# Patient Record
Sex: Male | Born: 1945 | Race: Black or African American | Hispanic: No | Marital: Single | Smoking: Former smoker
Health system: Southern US, Community
[De-identification: ages and names within clinical notes are randomized; demographics above are authoritative.]

## PROBLEM LIST (undated history)

## (undated) DIAGNOSIS — M1990 Unspecified osteoarthritis, unspecified site: Secondary | ICD-10-CM

## (undated) DIAGNOSIS — R55 Syncope and collapse: Secondary | ICD-10-CM

## (undated) DIAGNOSIS — M549 Dorsalgia, unspecified: Secondary | ICD-10-CM

## (undated) DIAGNOSIS — M109 Gout, unspecified: Secondary | ICD-10-CM

## (undated) DIAGNOSIS — D132 Benign neoplasm of duodenum: Secondary | ICD-10-CM

## (undated) DIAGNOSIS — G8929 Other chronic pain: Secondary | ICD-10-CM

## (undated) DIAGNOSIS — I10 Essential (primary) hypertension: Secondary | ICD-10-CM

## (undated) HISTORY — DX: Gout, unspecified: M10.9

## (undated) HISTORY — PX: KNEE SURGERY: SHX244

## (undated) HISTORY — DX: Unspecified osteoarthritis, unspecified site: M19.90

## (undated) HISTORY — DX: Syncope and collapse: R55

## (undated) HISTORY — DX: Other chronic pain: M54.9

## (undated) HISTORY — DX: Other chronic pain: G89.29

## (undated) HISTORY — DX: Benign neoplasm of duodenum: D13.2

## (undated) HISTORY — DX: Essential (primary) hypertension: I10

## (undated) SURGERY — COLONOSCOPY WITH PROPOFOL
Anesthesia: Monitor Anesthesia Care

## (undated) NOTE — *Deleted (*Deleted)
Physical Medicine and Rehabilitation Admission H&P    Chief Complaint  Patient presents with  . Leg Pain  : HPI: Andres White is a 55 y.o. male with PMH of HTN, arthritis, tobacco use, who presented with right leg weakness, L facial weakness, and speech changes. 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  Review of Systems  Constitutional: Negative.   HENT: Negative.   Eyes: Negative.   Respiratory: Negative.   Cardiovascular: Negative.   Gastrointestinal: Negative.   Genitourinary: Negative.   Musculoskeletal: Negative.   Skin: Negative.   Neurological: Positive for speech change and focal weakness.  Endo/Heme/Allergies: Negative.   Psychiatric/Behavioral: Negative.    Past Medical History:  Diagnosis Date  . Arthritis   . Chronic back pain   . Gout   . Hypertension   . Syncope and collapse    Past Surgical History:  Procedure Laterality Date  . CARDIAC CATHETERIZATION  2015   [facility]   . KNEE SURGERY     Family History  Problem Relation Age of Onset  . Hypertension Mother    Social History:  reports that he has been smoking cigarettes. He has smoked for the past 20.00 years. He has never used smokeless tobacco. He reports current alcohol use. He reports that he does not use drugs. Allergies: No Known Allergies Medications Prior to Admission  Medication Sig Dispense Refill  . amLODipine (NORVASC) 10 MG tablet Take 10 mg by mouth daily.    [name] aspirin EC 81 MG tablet Take 1 tablet (81 mg total) by mouth daily. 90 tablet 3  . atorvastatin (LIPITOR) 20 MG tablet Take 20 mg by mouth daily.    . citalopram (CELEXA) 20 MG tablet Take 20 mg by mouth daily.    [name] ibuprofen (ADVIL,MOTRIN) 800 MG tablet Take 800 mg by mouth 3 (three) times daily as needed.    [name] lisinopril (PRINIVIL,ZESTRIL) 40 MG tablet Take  40 mg by mouth daily.    . tamsulosin (FLOMAX) 0.4 MG CAPS capsule Take 0.4 mg by mouth daily.    . colchicine 0.6 MG tablet Take 0.6 mg by mouth 2 (two) times daily as needed.      Drug Regimen Review  Drug regimen was reviewed and remains appropriate with no significant issues identified  Home: Home Living Family/patient expects to be discharged to:: Private residence Living Arrangements: Alone Available Help at Discharge: Family, Available PRN/intermittently Type of Home: House Home Access: Stairs to enter Secretary/administrator of Steps: 2 Home Layout: One level Bathroom Shower/Tub: Engineer, manufacturing systems: Standard Bathroom Accessibility: Yes Home Equipment: Cane - single point Additional Comments: Pt reported 5 falls in the last 6 months, reported that it happens when he goes to stand up but does not feel light-headed, just unsteady   Functional History: Prior Function Level of Independence: Independent  Functional Status:  Mobility: Bed Mobility Overal bed mobility: Needs Assistance Bed Mobility: Supine to Sit Supine to sit: Supervision General bed mobility comments: pt up to chair with PT when OT presents. Transfers Overall transfer level: Needs assistance Equipment used: Rolling walker (2 wheeled) Transfers: Sit to/from Stand Sit to Stand: Min assist, Min guard General transfer comment: Verbal cues for hand placement and immediate standing balance intermittently. Ambulation/Gait Ambulation/Gait assistance: Min assist Gait Distance (Feet): 60 Feet (60x2.) Assistive device: Rolling walker (2 wheeled) (use  of railing in hallway on L) Gait Pattern/deviations: Decreased dorsiflexion - right General Gait Details: Pt presents with narrow BOS. Use of Acewrap for dorsiflexion assist. PT presenting with difficulty to slow or stop gait. Gait velocity: decreased Gait velocity interpretation: <1.31 ft/sec, indicative of household ambulator    ADL: ADL Overall  ADL's : Needs assistance/impaired General ADL Comments: MIN/MOD A with seated LB dressing, increased RR/decreased O2 with attempts to fold at the waist in sitting for LB ADLs. SETUP for seated UB ADLs d/t decreased R UE coordination. Requires MIN A/CGA for ADL transfers/fxl mobility with RW as he needs B UE support.  Cognition: Cognition Overall Cognitive Status: Impaired/Different from baseline Arousal/Alertness: Awake/alert Orientation Level: Oriented to person, Oriented to place, Oriented to situation, Disoriented to time Attention: Focused Focused Attention: Appears intact Memory: Impaired Memory Impairment: Decreased recall of new information, Decreased short term memory Immediate Memory Recall: Sock, Cyndee Brightly, Bed Memory Recall Sock: With Cue Memory Recall Blue: With Cue Memory Recall Bed: With Cue Awareness: Appears intact Executive Function: Sequencing Sequencing: Impaired Cognition Arousal/Alertness: Awake/alert Behavior During Therapy: WFL for tasks assessed/performed Overall Cognitive Status: Impaired/Different from baseline Area of Impairment: Orientation, Safety/judgement, Problem solving, Awareness Orientation Level: Disoriented to, Time Memory: Decreased short-term memory, Decreased recall of precautions Following Commands: Follows one step commands consistently, Follows multi-step commands inconsistently Safety/Judgement: Decreased awareness of safety, Decreased awareness of deficits Problem Solving: Slow processing, Requires verbal cues, Requires tactile cues  Physical Exam: Blood pressure (!) 106/57, pulse 69, temperature 97.9 F (36.6 C), temperature source Oral, resp. rate 18, height 5\' 7"  (1.702 m), weight 74.4 kg, SpO2 97 %. Physical Exam General: Alert and oriented x 3, No apparent distress HEENT: Head is normocephalic, atraumatic, PERRLA, EOMI, sclera anicteric, oral mucosa pink and moist, dentition intact, ext ear canals clear,  Neck: Supple without JVD or  lymphadenopathy Heart: Reg rate and rhythm. No murmurs rubs or gallops Chest: CTA bilaterally without wheezes, rales, or rhonchi; no distress Abdomen: Soft, non-tender, non-distended, bowel sounds positive. Extremities: No clubbing, cyanosis, or edema. Pulses are 2+ Skin: Clean and intact without signs of breakdown Neuro: Alert and oriented x1 (person). Mild expressive aphasia and dysarthria. Requires cues for immediate recall 3/3. Cranial nerves 2-12 are intact except for R facial droop. Sensory exam is normal. Reflexes are 2+ in all 4's. Fine motor coordination is intact. No tremors. Motor function is grossly 5/5 except for 4/5 RLE.   Psych: Pt's affect is appropriate. Pt is cooperative No results found for this or any previous visit (from the past 48 hour(s)). No results found.  Medical Problem List and Plan: 1.  Impaired mobility and ADLs secondary to acute CVA with left midbrain infarct  -patient may shower  -ELOS/Goals: 10-14 days S 2.  Antithrombotics: -DVT/anticoagulation:  Pharmaceutical: Lovenox  -antiplatelet therapy: Aspirin 3. Pain Management: *** 4. Mood: Continue Celexa 20mg  daily for depression 5. Neuropsych: This patient is capable of making decisions on his own behalf. 6. Skin/Wound Care: *** 7. Fluids/Electrolytes/Nutrition: *** 8. HTN: Monitor BP tid--continue amlodipine 10mg  and Lisinopril 40mg  daily 9. Tobacco abuse: 10.  Jacquelynn Cree, PA-C 06/27/2020   I have personally performed a face to face diagnostic evaluation, including, but not limited to relevant history and physical exam findings, of this patient and developed relevant assessment and plan.  Additionally, I have reviewed and concur with the physician assistant's documentation above.  Sula Soda, MD

---

## 2013-08-04 HISTORY — PX: CARDIAC CATHETERIZATION: SHX172

## 2014-01-06 ENCOUNTER — Emergency Department: Payer: Self-pay | Admitting: Emergency Medicine

## 2014-01-06 LAB — CBC
HCT: 40.1 % (ref 40.0–52.0)
HGB: 12.9 g/dL — ABNORMAL LOW (ref 13.0–18.0)
MCH: 30.4 pg (ref 26.0–34.0)
MCHC: 32.3 g/dL (ref 32.0–36.0)
MCV: 94 fL (ref 80–100)
Platelet: 140 10*3/uL — ABNORMAL LOW (ref 150–440)
RBC: 4.26 10*6/uL — AB (ref 4.40–5.90)
RDW: 13.3 % (ref 11.5–14.5)
WBC: 8.4 10*3/uL (ref 3.8–10.6)

## 2014-01-06 LAB — COMPREHENSIVE METABOLIC PANEL
ALBUMIN: 3.5 g/dL (ref 3.4–5.0)
ANION GAP: 7 (ref 7–16)
Alkaline Phosphatase: 76 U/L
BUN: 22 mg/dL — ABNORMAL HIGH (ref 7–18)
Bilirubin,Total: 0.4 mg/dL (ref 0.2–1.0)
CALCIUM: 9.1 mg/dL (ref 8.5–10.1)
CHLORIDE: 111 mmol/L — AB (ref 98–107)
CREATININE: 1.43 mg/dL — AB (ref 0.60–1.30)
Co2: 18 mmol/L — ABNORMAL LOW (ref 21–32)
EGFR (African American): 57 — ABNORMAL LOW
GFR CALC NON AF AMER: 49 — AB
Glucose: 93 mg/dL (ref 65–99)
OSMOLALITY: 275 (ref 275–301)
Potassium: 4.4 mmol/L (ref 3.5–5.1)
SGOT(AST): 51 U/L — ABNORMAL HIGH (ref 15–37)
SGPT (ALT): 43 U/L (ref 12–78)
Sodium: 136 mmol/L (ref 136–145)
Total Protein: 8.2 g/dL (ref 6.4–8.2)

## 2014-01-06 LAB — URINALYSIS, COMPLETE
BACTERIA: NONE SEEN
BILIRUBIN, UR: NEGATIVE
BLOOD: NEGATIVE
Glucose,UR: NEGATIVE mg/dL (ref 0–75)
Leukocyte Esterase: NEGATIVE
Nitrite: NEGATIVE
PH: 5 (ref 4.5–8.0)
Protein: NEGATIVE
RBC,UR: 1 /HPF (ref 0–5)
SPECIFIC GRAVITY: 1.023 (ref 1.003–1.030)
WBC UR: <1 /HPF (ref 0–5)

## 2014-01-06 LAB — LIPASE, BLOOD: LIPASE: 124 U/L (ref 73–393)

## 2014-03-23 ENCOUNTER — Emergency Department: Payer: Self-pay | Admitting: Emergency Medicine

## 2014-03-23 LAB — CBC WITH DIFFERENTIAL/PLATELET
BASOS ABS: 0 10*3/uL (ref 0.0–0.1)
BASOS PCT: 0.6 %
Eosinophil #: 0.1 10*3/uL (ref 0.0–0.7)
Eosinophil %: 1.3 %
HCT: 39.7 % — ABNORMAL LOW (ref 40.0–52.0)
HGB: 12.6 g/dL — AB (ref 13.0–18.0)
LYMPHS PCT: 40.6 %
Lymphocyte #: 2.9 10*3/uL (ref 1.0–3.6)
MCH: 30.2 pg (ref 26.0–34.0)
MCHC: 31.8 g/dL — ABNORMAL LOW (ref 32.0–36.0)
MCV: 95 fL (ref 80–100)
Monocyte #: 0.5 x10 3/mm (ref 0.2–1.0)
Monocyte %: 7.7 %
NEUTROS ABS: 3.6 10*3/uL (ref 1.4–6.5)
Neutrophil %: 49.8 %
Platelet: 176 10*3/uL (ref 150–440)
RBC: 4.17 10*6/uL — AB (ref 4.40–5.90)
RDW: 14.1 % (ref 11.5–14.5)
WBC: 7.1 10*3/uL (ref 3.8–10.6)

## 2014-03-23 LAB — URINALYSIS, COMPLETE
Bilirubin,UR: NEGATIVE
Blood: NEGATIVE
Glucose,UR: NEGATIVE mg/dL (ref 0–75)
Hyaline Cast: 23
KETONE: NEGATIVE
Leukocyte Esterase: NEGATIVE
Nitrite: NEGATIVE
Ph: 5 (ref 4.5–8.0)
Protein: NEGATIVE
RBC,UR: NONE SEEN /HPF (ref 0–5)
Specific Gravity: 1.01 (ref 1.003–1.030)
Squamous Epithelial: 2
WBC UR: 2 /HPF (ref 0–5)

## 2014-03-23 LAB — BASIC METABOLIC PANEL
Anion Gap: 11 (ref 7–16)
BUN: 36 mg/dL — AB (ref 7–18)
CHLORIDE: 106 mmol/L (ref 98–107)
CO2: 17 mmol/L — AB (ref 21–32)
Calcium, Total: 8.9 mg/dL (ref 8.5–10.1)
Creatinine: 1.88 mg/dL — ABNORMAL HIGH (ref 0.60–1.30)
EGFR (African American): 41 — ABNORMAL LOW
EGFR (Non-African Amer.): 35 — ABNORMAL LOW
Glucose: 116 mg/dL — ABNORMAL HIGH (ref 65–99)
OSMOLALITY: 278 (ref 275–301)
Potassium: 3.5 mmol/L (ref 3.5–5.1)
SODIUM: 134 mmol/L — AB (ref 136–145)

## 2014-03-23 LAB — TROPONIN I: Troponin-I: <0.02 ng/mL

## 2014-03-24 ENCOUNTER — Ambulatory Visit (INDEPENDENT_AMBULATORY_CARE_PROVIDER_SITE_OTHER): Payer: Medicare Other | Admitting: Cardiovascular Disease

## 2014-03-24 ENCOUNTER — Encounter: Payer: Self-pay | Admitting: Cardiovascular Disease

## 2014-03-24 VITALS — BP 100/70 | HR 61 | Ht 67.0 in | Wt 176.5 lb

## 2014-03-24 DIAGNOSIS — R55 Syncope and collapse: Secondary | ICD-10-CM | POA: Insufficient documentation

## 2014-03-24 DIAGNOSIS — R0789 Other chest pain: Secondary | ICD-10-CM | POA: Insufficient documentation

## 2014-03-24 DIAGNOSIS — I10 Essential (primary) hypertension: Secondary | ICD-10-CM | POA: Insufficient documentation

## 2014-03-24 DIAGNOSIS — R079 Chest pain, unspecified: Secondary | ICD-10-CM

## 2014-03-24 NOTE — Progress Notes (Signed)
HPI  This is a 68 year old African American male who was referred from the emergency room at [facility] for evaluation of chest pain and syncope. He is not aware of any previous cardiac history. He has known history of chronic back pain, hypertension, severe gout, tobacco use and possible chronic kidney disease. He has no family history of coronary artery disease or sudden death. Yesterday, while he was playing cards he had some chest pain followed by sweating and brief loss of consciousness. He reports that he ate peg feet earlier in the day and did not feel well. He was taken to the emergency room at [facility]. ECG showed sinus rhythm with possible old anterior and inferior infarcts labs showed a creatinine of 1.88 and BUN of 36. Troponin was normal. He was discharged home and asked to followup. He reports no previous similar episodes.  No Known Allergies   No current outpatient prescriptions on file prior to visit.   No current facility-administered medications on file prior to visit.     Past Medical History  Diagnosis Date  . Syncope and collapse   . Arthritis   . Gout   . Hypertension   . Chronic back pain      Past Surgical History  Procedure Laterality Date  . Knee surgery    . Cardiac catheterization  2015    [facility]      Family History  Problem Relation Age of Onset  . Hypertension Mother      History   Social History  . Marital Status: Single    Spouse Name: N/A    Number of Children: N/A  . Years of Education: N/A   Occupational History  . Not on file.   Social History Main Topics  . Smoking status: Current Every Day Smoker -- 20 years    Types: Cigarettes  . Smokeless tobacco: Not on file  . Alcohol Use: Yes     Comment: occasional  . Drug Use: No  . Sexual Activity: Not on file   Other Topics Concern  . Not on file   Social History Narrative  . No narrative on file     ROS A 10 point review of system was performed. It is negative other than that  mentioned in the history of present illness.   PHYSICAL EXAM   BP 100/70  Pulse 61  Ht 5\' 7"  (1.702 m)  Wt 176 lb 8 oz (80.06 kg)  BMI 27.64 kg/m2 Constitutional: He is oriented to person, place, and time. He appears well-developed and well-nourished. No distress.  HENT: No nasal discharge.  Head: Normocephalic and atraumatic.  Eyes: Pupils are equal and round.  No discharge. Neck: Normal range of motion. Neck supple. No JVD present. No thyromegaly present.  Cardiovascular: Normal rate, regular rhythm, normal heart sounds. Exam reveals no gallop and no friction rub. No murmur heard.  Pulmonary/Chest: Effort normal and breath sounds normal. No stridor. No respiratory distress. He has no wheezes. He has no rales. He exhibits no tenderness.  Abdominal: Soft. Bowel sounds are normal. He exhibits no distension. There is no tenderness. There is no rebound and no guarding.  Musculoskeletal: Normal range of motion. He exhibits no edema and no tenderness.  Neurological: He is alert and oriented to person, place, and time. Coordination normal.  Skin: Skin is warm and dry. No rash noted. He is not diaphoretic. No erythema. No pallor.  Psychiatric: He has a normal mood and affect. His behavior is normal. Judgment and thought  content normal.       TZG:YFVCB  Rhythm  -Anteroseptal infarct -age undetermined  -Old inferior infarct.   ABNORMAL    ASSESSMENT AND PLAN

## 2014-03-24 NOTE — Patient Instructions (Addendum)
Andres White  Your caregiver has ordered a Stress Test with nuclear imaging. The purpose of this test is to evaluate the blood supply to your heart muscle. This procedure is referred to as a "Non-Invasive Stress Test." This is because other than having an IV started in your vein, nothing is inserted or "invades" your body. Cardiac stress tests are done to find areas of poor blood flow to the heart by determining the extent of coronary artery disease (CAD). Some patients exercise on a treadmill, which naturally increases the blood flow to your heart, while others who are  unable to walk on a treadmill due to physical limitations have a pharmacologic/chemical stress agent called Lexiscan . This medicine will mimic walking on a treadmill by temporarily increasing your coronary blood flow.   Please note: these test may take anywhere between 2-4 hours to complete  PLEASE REPORT TO [facility] AT THE FIRST DESK WILL DIRECT YOU WHERE TO GO  Date of Procedure:________8/28/15_____________________________  Arrival Time for Procedure:______0945 am ________________________   PLEASE NOTIFY THE OFFICE AT LEAST 24 HOURS IN ADVANCE IF YOU ARE UNABLE TO KEEP YOUR APPOINTMENT.  [phone] AND  PLEASE NOTIFY NUCLEAR MEDICINE AT [facility] AT LEAST 24 HOURS IN ADVANCE IF YOU ARE UNABLE TO KEEP YOUR APPOINTMENT. [phone]  How to prepare for your Myoview test:  1. Do not eat or drink after midnight 2. No caffeine for 24 hours prior to test 3. No smoking 24 hours prior to test. 4. Your medication may be taken with water.  If your doctor stopped a medication because of this test, do not take that medication. 5. Ladies, please do not wear dresses.  Skirts or pants are appropriate. Please wear a short sleeve shirt. 6. No perfume, cologne or lotion. 7. Wear comfortable walking shoes. No heels!  Your physician has requested that you have an echocardiogram. Echocardiography is a  painless test that uses sound waves to create images of your heart. It provides your doctor with information about the size and shape of your heart and how well your heart's chambers and valves are working. This procedure takes approximately one hour. There are no restrictions for this procedure.   Your physician has requested that you have a carotid duplex. This test is an ultrasound of the carotid arteries in your neck. It looks at blood flow through these arteries that supply the brain with blood. Allow one hour for this exam. There are no restrictions or special instructions.   Your physician recommends that you schedule a follow-up appointment in:  After your tests with Dr. Fletcher Anon

## 2014-03-24 NOTE — Assessment & Plan Note (Addendum)
Likely was vasovagal syncope with an element of volume depletion as his creatinine and BUN were above baseline.  I encouraged him to increase fluid intake. Given his abnormal EKG, I ordered an echocardiogram. He also has prolonged tobacco use and thus I ordered carotid Doppler.

## 2014-03-24 NOTE — Assessment & Plan Note (Signed)
Blood pressure is well controlled on current medications. 

## 2014-03-24 NOTE — Assessment & Plan Note (Signed)
Symptoms are overall atypical. However, the patient has significant abnormal EKG with possible old anterior and inferior infarcts. I recommend evaluation with a pharmacologic nuclear stress test.

## 2014-03-31 ENCOUNTER — Ambulatory Visit: Payer: Self-pay | Admitting: Cardiovascular Disease

## 2014-03-31 DIAGNOSIS — R079 Chest pain, unspecified: Secondary | ICD-10-CM

## 2014-04-03 ENCOUNTER — Other Ambulatory Visit: Payer: Self-pay

## 2014-04-03 DIAGNOSIS — R079 Chest pain, unspecified: Secondary | ICD-10-CM

## 2014-04-07 ENCOUNTER — Other Ambulatory Visit: Payer: Self-pay

## 2014-04-07 ENCOUNTER — Encounter (INDEPENDENT_AMBULATORY_CARE_PROVIDER_SITE_OTHER): Payer: Medicare Other

## 2014-04-07 ENCOUNTER — Other Ambulatory Visit (INDEPENDENT_AMBULATORY_CARE_PROVIDER_SITE_OTHER): Payer: Medicare Other

## 2014-04-07 DIAGNOSIS — I6529 Occlusion and stenosis of unspecified carotid artery: Secondary | ICD-10-CM

## 2014-04-07 DIAGNOSIS — R55 Syncope and collapse: Secondary | ICD-10-CM

## 2014-04-07 DIAGNOSIS — R079 Chest pain, unspecified: Secondary | ICD-10-CM

## 2014-04-11 NOTE — Progress Notes (Signed)
No answer/no vm 9/8

## 2014-04-13 ENCOUNTER — Ambulatory Visit (INDEPENDENT_AMBULATORY_CARE_PROVIDER_SITE_OTHER): Payer: Medicare Other | Admitting: Cardiovascular Disease

## 2014-04-13 ENCOUNTER — Encounter: Payer: Self-pay | Admitting: Cardiovascular Disease

## 2014-04-13 VITALS — BP 135/82 | HR 55 | Ht 67.0 in | Wt 179.4 lb

## 2014-04-13 DIAGNOSIS — I10 Essential (primary) hypertension: Secondary | ICD-10-CM

## 2014-04-13 DIAGNOSIS — I6529 Occlusion and stenosis of unspecified carotid artery: Secondary | ICD-10-CM

## 2014-04-13 DIAGNOSIS — I6522 Occlusion and stenosis of left carotid artery: Secondary | ICD-10-CM | POA: Insufficient documentation

## 2014-04-13 DIAGNOSIS — R0789 Other chest pain: Secondary | ICD-10-CM

## 2014-04-13 MED ORDER — ASPIRIN EC 81 MG PO TBEC: 81.0000 mg | DELAYED_RELEASE_TABLET | Freq: Every day | ORAL | Status: DC

## 2014-04-13 NOTE — Assessment & Plan Note (Signed)
Blood pressure is well controlled on current medications. 

## 2014-04-13 NOTE — Assessment & Plan Note (Signed)
There was 40-59% left carotid stenosis. I advised him to continue on aspirin 81 mg once daily. He gets his lipid profile checked with his primary care physician. I recommend a target LDL of less than 100. I scheduled him for a followup carotid Doppler in one year.

## 2014-04-13 NOTE — Progress Notes (Signed)
HPI  This is a 68 year old African American male who is here today for a followup visit regarding chest pain and syncope. He is not aware of any previous cardiac history. He has known history of chronic back pain, hypertension, severe gout, tobacco use and possible chronic kidney disease. He has no family history of coronary artery disease or sudden death. He was seen recently for an episode of chest pain followed by sweating and brief loss of consciousness while he was playing cards. He reported that he ate peg feet earlier in the day and did not feel well. He was taken to the emergency room at [facility]. ECG showed sinus rhythm with possible old anterior and inferior infarcts labs showed a creatinine of 1.88 and BUN of 36. Troponin was normal. He was discharged home and asked to followup. He reports no previous similar episodes. He underwent cardiac workup which included a nuclear stress test which showed no evidence of ischemia. Echocardiogram showed normal LV systolic function with grade 1 diastolic dysfunction. Carotid Doppler showed moderate left carotid stenosis. He is doing well and reports no new symptoms.  No Known Allergies   Current Outpatient Prescriptions on File Prior to Visit  Medication Sig Dispense Refill  . amLODipine (NORVASC) 10 MG tablet Take 10 mg by mouth daily.      . citalopram (CELEXA) 20 MG tablet Take 20 mg by mouth daily.      . colchicine 0.6 MG tablet Take 0.6 mg by mouth 2 (two) times daily as needed.      [name] ibuprofen (ADVIL,MOTRIN) 800 MG tablet Take 800 mg by mouth 3 (three) times daily as needed.      [name] lisinopril (PRINIVIL,ZESTRIL) 40 MG tablet Take 40 mg by mouth daily.      [name] oxyCODONE-acetaminophen (PERCOCET/ROXICET) 5-325 MG per tablet Take 1 tablet by mouth every 8 (eight) hours as needed for severe pain.       No current facility-administered medications on file prior to visit.     Past Medical History  Diagnosis Date  . Syncope and collapse   .  Arthritis   . Gout   . Hypertension   . Chronic back pain      Past Surgical History  Procedure Laterality Date  . Knee surgery    . Cardiac catheterization  2015    [facility]      Family History  Problem Relation Age of Onset  . Hypertension Mother      History   Social History  . Marital Status: Single    Spouse Name: N/A    Number of Children: N/A  . Years of Education: N/A   Occupational History  . Not on file.   Social History Main Topics  . Smoking status: Current Every Day Smoker -- 20 years    Types: Cigarettes  . Smokeless tobacco: Not on file  . Alcohol Use: Yes     Comment: occasional  . Drug Use: No  . Sexual Activity: Not on file   Other Topics Concern  . Not on file   Social History Narrative  . No narrative on file     ROS A 10 point review of system was performed. It is negative other than that mentioned in the history of present illness.   PHYSICAL EXAM   BP 135/82  Pulse 55  Ht 5\' 7"  (1.702 m)  Wt 179 lb 6.4 oz (81.375 kg)  BMI 28.09 kg/m2 Constitutional: He is oriented to person, place, and time.  He appears well-developed and well-nourished. No distress.  HENT: No nasal discharge.  Head: Normocephalic and atraumatic.  Eyes: Pupils are equal and round.  No discharge. Neck: Normal range of motion. Neck supple. No JVD present. No thyromegaly present.  Cardiovascular: Normal rate, regular rhythm, normal heart sounds. Exam reveals no gallop and no friction rub. No murmur heard.  Pulmonary/Chest: Effort normal and breath sounds normal. No stridor. No respiratory distress. He has no wheezes. He has no rales. He exhibits no tenderness.  Abdominal: Soft. Bowel sounds are normal. He exhibits no distension. There is no tenderness. There is no rebound and no guarding.  Musculoskeletal: Normal range of motion. He exhibits no edema and no tenderness.  Neurological: He is alert and oriented to person, place, and time. Coordination normal.  Skin:  Skin is warm and dry. No rash noted. He is not diaphoretic. No erythema. No pallor.  Psychiatric: He has a normal mood and affect. His behavior is normal. Judgment and thought content normal.       ASSESSMENT AND PLAN

## 2014-04-13 NOTE — Assessment & Plan Note (Signed)
This result completely. Nuclear stress test was normal. No further cardiac workup is recommended.

## 2014-04-13 NOTE — Patient Instructions (Signed)
Continue same medications.   Your physician wants you to follow-up in: 1 year.  You will receive a reminder letter in the mail two months in advance. If you don't receive a letter, please call our office to schedule the follow-up appointment.  

## 2014-04-28 ENCOUNTER — Telehealth: Payer: Self-pay | Admitting: *Deleted

## 2014-04-28 DIAGNOSIS — I6522 Occlusion and stenosis of left carotid artery: Secondary | ICD-10-CM

## 2014-04-28 NOTE — Telephone Encounter (Signed)
Message copied by Tracie Harrier on Fri Apr 28, 2014  4:55 PM ------      Message from: Kathlyn Sacramento A      Created: Mon Apr 10, 2014 11:32 AM       Moderate left carotid stenosis. Recommend a follow up carotid doppler in 1 year. ------

## 2015-06-19 ENCOUNTER — Other Ambulatory Visit: Payer: Self-pay | Admitting: Cardiovascular Disease

## 2015-06-19 DIAGNOSIS — I6523 Occlusion and stenosis of bilateral carotid arteries: Secondary | ICD-10-CM

## 2015-06-26 ENCOUNTER — Encounter: Payer: Self-pay | Admitting: *Deleted

## 2015-06-26 ENCOUNTER — Ambulatory Visit: Payer: Self-pay | Admitting: Cardiovascular Disease

## 2015-06-26 DIAGNOSIS — R0989 Other specified symptoms and signs involving the circulatory and respiratory systems: Secondary | ICD-10-CM

## 2015-07-31 ENCOUNTER — Encounter: Payer: Self-pay | Admitting: *Deleted

## 2015-07-31 ENCOUNTER — Other Ambulatory Visit: Payer: Self-pay | Admitting: Cardiovascular Disease

## 2015-07-31 DIAGNOSIS — I6522 Occlusion and stenosis of left carotid artery: Secondary | ICD-10-CM

## 2015-08-15 ENCOUNTER — Encounter (HOSPITAL_COMMUNITY): Payer: Self-pay | Admitting: Dermatopathology

## 2015-09-04 ENCOUNTER — Encounter: Payer: Self-pay | Admitting: *Deleted

## 2015-09-04 ENCOUNTER — Ambulatory Visit: Payer: Self-pay | Admitting: Cardiovascular Disease

## 2017-11-16 ENCOUNTER — Encounter: Payer: Self-pay | Admitting: Emergency Medicine

## 2017-11-16 ENCOUNTER — Emergency Department: Payer: Medicare Other

## 2017-11-16 DIAGNOSIS — I10 Essential (primary) hypertension: Secondary | ICD-10-CM | POA: Insufficient documentation

## 2017-11-16 DIAGNOSIS — R0602 Shortness of breath: Secondary | ICD-10-CM | POA: Diagnosis not present

## 2017-11-16 DIAGNOSIS — R079 Chest pain, unspecified: Secondary | ICD-10-CM | POA: Diagnosis present

## 2017-11-16 DIAGNOSIS — F17210 Nicotine dependence, cigarettes, uncomplicated: Secondary | ICD-10-CM | POA: Insufficient documentation

## 2017-11-16 DIAGNOSIS — Z79899 Other long term (current) drug therapy: Secondary | ICD-10-CM | POA: Insufficient documentation

## 2017-11-16 LAB — BASIC METABOLIC PANEL
ANION GAP: 9 (ref 5–15)
BUN: 14 mg/dL (ref 6–20)
CHLORIDE: 106 mmol/L (ref 101–111)
CO2: 21 mmol/L — AB (ref 22–32)
Calcium: 8.9 mg/dL (ref 8.9–10.3)
Creatinine, Ser: 1.42 mg/dL — ABNORMAL HIGH (ref 0.61–1.24)
GFR calc non Af Amer: 47 mL/min — ABNORMAL LOW (ref >60)
GFR, EST AFRICAN AMERICAN: 55 mL/min — AB (ref >60)
GLUCOSE: 105 mg/dL — AB (ref 65–99)
Potassium: 3.5 mmol/L (ref 3.5–5.1)
Sodium: 136 mmol/L (ref 135–145)

## 2017-11-16 LAB — CBC
HEMATOCRIT: 41.1 % (ref 40.0–52.0)
HEMOGLOBIN: 13.5 g/dL (ref 13.0–18.0)
MCH: 30.8 pg (ref 26.0–34.0)
MCHC: 32.9 g/dL (ref 32.0–36.0)
MCV: 93.6 fL (ref 80.0–100.0)
Platelets: 143 10*3/uL — ABNORMAL LOW (ref 150–440)
RBC: 4.39 MIL/uL — AB (ref 4.40–5.90)
RDW: 15.2 % — ABNORMAL HIGH (ref 11.5–14.5)
WBC: 6.9 10*3/uL (ref 3.8–10.6)

## 2017-11-16 LAB — TROPONIN I: Troponin I: <0.03 ng/mL (ref <0.03)

## 2017-11-16 NOTE — ED Triage Notes (Signed)
Pt to triage in wheelchair with left sided chest pain. Pts family reports pt came home, did not get out of car and family seen pt slumped over passed out. Pt woke to family's voice and c/o of "feeling bad" and chest pain with SOB. Pt in triage is laughing and sts, "Aint nothing wrong with me." Pt denies N/V and denies cardiac hx.

## 2017-11-17 ENCOUNTER — Emergency Department
Admission: EM | Admit: 2017-11-17 | Discharge: 2017-11-17 | Disposition: A | Discharge status: Home / Self Care | Payer: Medicare Other | Attending: Emergency Medicine | Admitting: Emergency Medicine

## 2017-11-17 DIAGNOSIS — R079 Chest pain, unspecified: Secondary | ICD-10-CM

## 2017-11-17 LAB — TROPONIN I

## 2017-11-17 LAB — BRAIN NATRIURETIC PEPTIDE: B NATRIURETIC PEPTIDE 5: 15 pg/mL (ref 0.0–100.0)

## 2017-11-17 NOTE — ED Provider Notes (Signed)
[facility] Emergency Department Provider Note   ____________________________________________   First MD Initiated Contact with Patient 11/17/17 [phone]     (approximate)  I have reviewed the triage vital signs and the nursing notes.   HISTORY  Chief Complaint Chest Pain    HPI Andres White is a 72 y.o. male who comes into the hospital today with some chest pain.  His girlfriend called the ambulance because he seemed very out of it and she thought he was going to pass out.  He was slobbering and he complained of some headache and chest pain.  She was concerned about a heart attack.  He was seen by the ambulance earlier today but then brought in later this evening.  The patient states that his chest pain started today and it was in the mid chest with no radiation.  He endorses some sweats with no dizziness and no nausea or vomiting.  He did not take anything for the chest pain he states that it is gone at this time.  The patient states that he gets short of breath often but he is a smoker and he does drink 3-4 beers daily.  The patient is here for evaluation.   Past Medical History:  Diagnosis Date  . Arthritis   . Chronic back pain   . Gout   . Hypertension   . Syncope and collapse     Patient Active Problem List   Diagnosis Date Noted  . Left carotid stenosis 04/13/2014  . Faintness 03/24/2014  . Chest discomfort 03/24/2014  . Hypertension     Past Surgical History:  Procedure Laterality Date  . CARDIAC CATHETERIZATION  2015   [facility]   . KNEE SURGERY      Prior to Admission medications   Medication Sig Start Date End Date Taking? Authorizing Provider  amLODipine (NORVASC) 10 MG tablet Take 10 mg by mouth daily.    [provider]  aspirin EC 81 MG tablet Take 1 tablet (81 mg total) by mouth daily. 04/13/14   [name], MD  citalopram (CELEXA) 20 MG tablet Take 20 mg by mouth daily.    [provider]  colchicine 0.6 MG  tablet Take 0.6 mg by mouth 2 (two) times daily as needed.    [provider]  ibuprofen (ADVIL,MOTRIN) 800 MG tablet Take 800 mg by mouth 3 (three) times daily as needed.    [provider]  lisinopril (PRINIVIL,ZESTRIL) 40 MG tablet Take 40 mg by mouth daily.    [provider]  oxyCODONE-acetaminophen (PERCOCET/ROXICET) 5-325 MG per tablet Take 1 tablet by mouth every 8 (eight) hours as needed for severe pain.    [provider]    Allergies Patient has no known allergies.  Family History  Problem Relation Age of Onset  . Hypertension Mother     Social History Social History   Tobacco Use  . Smoking status: Current Every Day Smoker    Years: 20.00    Types: Cigarettes  . Smokeless tobacco: Never Used  Substance Use Topics  . Alcohol use: Yes    Comment: occasional  . Drug use: No    Review of Systems  Constitutional: Sweats Eyes: No visual changes. ENT: No sore throat. Cardiovascular:  chest pain. Respiratory:  shortness of breath. Gastrointestinal: No abdominal pain.  No nausea, no vomiting.  No diarrhea.  No constipation. Genitourinary: Negative for dysuria. Musculoskeletal: Negative for back pain. Skin: Negative for rash. Neurological: Negative for  headaches, focal weakness or numbness.   ____________________________________________   PHYSICAL EXAM:  VITAL SIGNS: ED Triage Vitals [11/16/17 2017]  Enc Vitals Group     BP (!) 98/57     Pulse Rate (!) 48     Resp 20     Temp      Temp Source Oral     SpO2 97 %     Weight 179 lb (81.2 kg)     Height      Head Circumference      Peak Flow      Pain Score 5     Pain Loc      Pain Edu?      Excl. in [place]?     Constitutional: Alert and oriented. Well appearing and in mild distress. Eyes: Conjunctivae are normal. PERRL. EOMI. Head: Atraumatic. Nose: No congestion/rhinnorhea. Mouth/Throat: Mucous membranes are moist.  Oropharynx non-erythematous. Cardiovascular:  Normal rate, regular rhythm. Grossly normal heart sounds.  Good peripheral circulation. Respiratory: Normal respiratory effort.  No retractions. Lungs CTAB. Gastrointestinal: Soft and nontender. No distention.  Positive bowel sounds Musculoskeletal: No lower extremity tenderness nor edema.   Neurologic:  Normal speech and language.  Skin:  Skin is warm, dry and intact.  Psychiatric: Mood and affect are normal.   ____________________________________________   LABS (all labs ordered are listed, but only abnormal results are displayed)  Labs Reviewed  BASIC METABOLIC PANEL - Abnormal; Notable for the following components:      Result Value   CO2 21 (*)    Glucose, Bld 105 (*)    Creatinine, Ser 1.42 (*)    GFR calc non Af Amer 47 (*)    GFR calc Af Amer 55 (*)    All other components within normal limits  CBC - Abnormal; Notable for the following components:   RBC 4.39 (*)    RDW 15.2 (*)    Platelets 143 (*)    All other components within normal limits  TROPONIN I  BRAIN NATRIURETIC PEPTIDE  TROPONIN I   ____________________________________________  EKG  ED ECG REPORT I, Loney Hering, the attending physician, personally viewed and interpreted this ECG.   Date: 11/16/2017  EKG Time: 2019  Rate: 46  Rhythm: sinus tachycardia  Axis: left axis deviation  Intervals:none  ST&T Change: none  ____________________________________________  RADIOLOGY  ED MD interpretation: Chest x-ray: No active cardiopulmonary process, diffuse mild bronchitic changes are likely chronic, linear atelectasis  Official radiology report(s): Dg Chest 2 View  Result Date: 11/16/2017 CLINICAL DATA:  72 year old male with left-sided chest pain EXAM: CHEST - 2 VIEW COMPARISON:  None. FINDINGS: Low inspiratory volumes. Linear atelectasis versus scarring in the lingula. Mild vascular congestion without overt edema. Diffuse mild bronchitic changes are likely chronic. No pneumothorax, pleural  effusion or focal airspace consolidation. No acute osseous abnormality. IMPRESSION: 1. No active cardiopulmonary process. 2. Diffuse mild bronchitic changes are likely chronic. 3. Linear atelectasis versus scarring in the lingula. 4. Slightly low inspiratory volumes. Electronically Signed   By: Jacqulynn Cadet M.D.   On: 11/16/2017 20:51    ____________________________________________   PROCEDURES  Procedure(s) performed: None  Procedures  Critical Care performed: No  ____________________________________________   INITIAL IMPRESSION / ASSESSMENT AND PLAN / ED COURSE  As part of my medical decision making, I reviewed the following data within the electronic MEDICAL RECORD NUMBER Notes from prior ED visits and [place] Controlled Substance Database   This is a 72 year old male who comes into the hospital  today with some chest pain and shortness of breath.  The patient states that his pain is gone at this time.  My differential diagnosis includes COPD, pneumonia, acute coronary syndrome, nonspecific chest pain, gastritis.  We did check some blood work on the patient to include a CBC BMP and 2 troponins.  The patient's blood work is negative.  The patient also had a BNP as he is been having some dyspnea on exertion but his BNP is negative.  The patient again feels that his pain is improved and he feels comfortable going home and following up with his primary care physician.  The patient did have some bradycardia while he was asleep but when he woke up his heart rate improved.  The patient will be discharged home to follow-up with his primary care physician.      ____________________________________________   FINAL CLINICAL IMPRESSION(S) / ED DIAGNOSES  Final diagnoses:  Chest pain, unspecified type     ED Discharge Orders    None       Note:  This document was prepared using Dragon voice recognition software and may include unintentional dictation errors.    Loney Hering,  MD 11/17/17 [phone]

## 2017-11-17 NOTE — Discharge Instructions (Addendum)
Please follow-up with your primary care physician for further evaluation of your chest pain. °

## 2017-11-18 ENCOUNTER — Emergency Department
Admission: EM | Admit: 2017-11-18 | Discharge: 2017-11-18 | Disposition: A | Discharge status: Home / Self Care | Payer: Medicare Other | Attending: Emergency Medicine | Admitting: Emergency Medicine

## 2017-11-18 ENCOUNTER — Other Ambulatory Visit: Payer: Self-pay

## 2017-11-18 DIAGNOSIS — E860 Dehydration: Secondary | ICD-10-CM | POA: Diagnosis not present

## 2017-11-18 DIAGNOSIS — R55 Syncope and collapse: Secondary | ICD-10-CM | POA: Diagnosis present

## 2017-11-18 DIAGNOSIS — Z79899 Other long term (current) drug therapy: Secondary | ICD-10-CM | POA: Insufficient documentation

## 2017-11-18 DIAGNOSIS — F17210 Nicotine dependence, cigarettes, uncomplicated: Secondary | ICD-10-CM | POA: Diagnosis not present

## 2017-11-18 DIAGNOSIS — I10 Essential (primary) hypertension: Secondary | ICD-10-CM | POA: Diagnosis not present

## 2017-11-18 DIAGNOSIS — Z7982 Long term (current) use of aspirin: Secondary | ICD-10-CM | POA: Diagnosis not present

## 2017-11-18 LAB — URINALYSIS, COMPLETE (UACMP) WITH MICROSCOPIC
BACTERIA UA: NONE SEEN
BILIRUBIN URINE: NEGATIVE
Glucose, UA: NEGATIVE mg/dL
KETONES UR: NEGATIVE mg/dL
Leukocytes, UA: NEGATIVE
Nitrite: NEGATIVE
PH: 5 (ref 5.0–8.0)
PROTEIN: NEGATIVE mg/dL
Specific Gravity, Urine: 1.01 (ref 1.005–1.030)

## 2017-11-18 LAB — CBC WITH DIFFERENTIAL/PLATELET
Basophils Absolute: 0 10*3/uL (ref 0–0.1)
Basophils Relative: 0 %
Eosinophils Absolute: 0.1 10*3/uL (ref 0–0.7)
Eosinophils Relative: 1 %
HEMATOCRIT: 39.9 % — AB (ref 40.0–52.0)
HEMOGLOBIN: 13.1 g/dL (ref 13.0–18.0)
LYMPHS ABS: 3.2 10*3/uL (ref 1.0–3.6)
Lymphocytes Relative: 48 %
MCH: 30.2 pg (ref 26.0–34.0)
MCHC: 32.8 g/dL (ref 32.0–36.0)
MCV: 92.1 fL (ref 80.0–100.0)
MONOS PCT: 12 %
Monocytes Absolute: 0.8 10*3/uL (ref 0.2–1.0)
NEUTROS ABS: 2.5 10*3/uL (ref 1.4–6.5)
NEUTROS PCT: 39 %
Platelets: 132 10*3/uL — ABNORMAL LOW (ref 150–440)
RBC: 4.33 MIL/uL — ABNORMAL LOW (ref 4.40–5.90)
RDW: 15.2 % — ABNORMAL HIGH (ref 11.5–14.5)
WBC: 6.6 10*3/uL (ref 3.8–10.6)

## 2017-11-18 LAB — BASIC METABOLIC PANEL
Anion gap: 8 (ref 5–15)
BUN: 23 mg/dL — AB (ref 6–20)
CALCIUM: 8.6 mg/dL — AB (ref 8.9–10.3)
CHLORIDE: 111 mmol/L (ref 101–111)
CO2: 18 mmol/L — AB (ref 22–32)
CREATININE: 2.21 mg/dL — AB (ref 0.61–1.24)
GFR calc Af Amer: 32 mL/min — ABNORMAL LOW (ref >60)
GFR calc non Af Amer: 28 mL/min — ABNORMAL LOW (ref >60)
GLUCOSE: 96 mg/dL (ref 65–99)
Potassium: 3.9 mmol/L (ref 3.5–5.1)
Sodium: 137 mmol/L (ref 135–145)

## 2017-11-18 LAB — TROPONIN I: Troponin I: <0.03 ng/mL (ref <0.03)

## 2017-11-18 MED ORDER — SODIUM CHLORIDE 0.9 % IV BOLUS
1000.0000 mL | Freq: Once | INTRAVENOUS | Status: AC
  Administered 2017-11-18: 1000 mL via INTRAVENOUS

## 2017-11-18 NOTE — Discharge Instructions (Addendum)
As discussed it is very important that you stop alcohol use. Please focus on hydrating over the next few days and get your kidney function rechecked. Please seek medical attention for any high fevers, chest pain, shortness of breath, change in behavior, persistent vomiting, bloody stool or any other new or concerning symptoms.

## 2017-11-18 NOTE — ED Provider Notes (Signed)
[facility] Emergency Department Provider Note  ____________________________________________   I have reviewed the triage vital signs and the nursing notes.   HISTORY  Chief Complaint Loss of Consciousness   History limited by: Not Limited   HPI Andres White is a 72 y.o. male who presents to the emergency department today via EMS after syncopal episode.  Patient states he was outside when he passed out.  He denies any concurrent chest pain or palpitations.  He denies any recent illnesses.  He was seen yesterday for a near syncopal episode.  He states he has never had symptoms like this until it started yesterday.  For the episode yesterday he was also having some chest pain and shortness of breath although he desires that today.  Stated he did have one brief episode of feeling dizzy earlier in the day.  No other recent illnesses.  Feels improved at the time my examination. When EMS first arrived patient had systolic blood pressures in the 80s. Gave IVFs.    Per medical record review patient has a history of faintness.  Past Medical History:  Diagnosis Date  . Arthritis   . Chronic back pain   . Gout   . Hypertension   . Syncope and collapse     Patient Active Problem List   Diagnosis Date Noted  . Left carotid stenosis 04/13/2014  . Faintness 03/24/2014  . Chest discomfort 03/24/2014  . Hypertension     Past Surgical History:  Procedure Laterality Date  . CARDIAC CATHETERIZATION  2015   [facility]   . KNEE SURGERY      Prior to Admission medications   Medication Sig Start Date End Date Taking? Authorizing Provider  amLODipine (NORVASC) 10 MG tablet Take 10 mg by mouth daily.    [provider]  aspirin EC 81 MG tablet Take 1 tablet (81 mg total) by mouth daily. 04/13/14   [name], MD  citalopram (CELEXA) 20 MG tablet Take 20 mg by mouth daily.    [provider]  colchicine 0.6 MG tablet Take 0.6 mg by mouth 2 (two) times  daily as needed.    [provider]  ibuprofen (ADVIL,MOTRIN) 800 MG tablet Take 800 mg by mouth 3 (three) times daily as needed.    [provider]  lisinopril (PRINIVIL,ZESTRIL) 40 MG tablet Take 40 mg by mouth daily.    [provider]  oxyCODONE-acetaminophen (PERCOCET/ROXICET) 5-325 MG per tablet Take 1 tablet by mouth every 8 (eight) hours as needed for severe pain.    [provider]    Allergies Patient has no known allergies.  Family History  Problem Relation Age of Onset  . Hypertension Mother     Social History Social History   Tobacco Use  . Smoking status: Current Every Day Smoker    Years: 20.00    Types: Cigarettes  . Smokeless tobacco: Never Used  Substance Use Topics  . Alcohol use: Yes    Comment: occasional  . Drug use: No    Review of Systems Constitutional: No fever/chills Eyes: No visual changes. ENT: No sore throat. Cardiovascular: Denies chest pain. Respiratory: Denies shortness of breath. Gastrointestinal: No abdominal pain.  No nausea, no vomiting.  No diarrhea.   Genitourinary: Negative for dysuria. Musculoskeletal: Negative for back pain. Skin: Negative for rash. Neurological: Negative for headaches, focal weakness or numbness.  ____________________________________________   PHYSICAL EXAM:  VITAL SIGNS: ED Triage Vitals  Enc Vitals Group  BP 11/18/17 1830 108/69     Pulse Rate 11/18/17 1830 74     Resp 11/18/17 1830 18     Temp 11/18/17 1830 98.5 F (36.9 C)     Temp Source 11/18/17 1830 Oral     SpO2 11/18/17 1830 98 %     Weight 11/18/17 1831 200 lb (90.7 kg)     Height 11/18/17 1831 5\' 7"  (1.702 m)     Head Circumference --      Peak Flow --      Pain Score 11/18/17 1831 0   Constitutional: Alert and oriented. Well appearing and in no distress. Eyes: Conjunctivae are normal.  ENT   Head: Normocephalic and atraumatic.   Nose: No congestion/rhinnorhea.   Mouth/Throat: Mucous  membranes are moist.   Neck: No stridor. Hematological/Lymphatic/Immunilogical: No cervical lymphadenopathy. Cardiovascular: Normal rate, regular rhythm.  No murmurs, rubs, or gallops.  Respiratory: Normal respiratory effort without tachypnea nor retractions. Breath sounds are clear and equal bilaterally. No wheezes/rales/rhonchi. Gastrointestinal: Soft and non tender. No rebound. No guarding.  Genitourinary: Deferred Musculoskeletal: Normal range of motion in all extremities. No lower extremity edema. Neurologic:  Normal speech and language. No gross focal neurologic deficits are appreciated.  Skin:  Skin is warm, dry and intact. No rash noted. Psychiatric: Mood and affect are normal. Speech and behavior are normal. Patient exhibits appropriate insight and judgment.  ____________________________________________    LABS (pertinent positives/negatives)  UA not consistent with infection CBC wbc 6.6, hgb 13.1, plt 132 BMP na 137, k 3.9, cr 2.21 Trop <0.03  ____________________________________________   EKG  I, Nance Pear, attending physician, personally viewed and interpreted this EKG  EKG Time: 1828 Rate: 74 Rhythm: sinus rhythm Axis: left axis deviation Intervals: qtc 433 QRS: narrow, q waves V1-V4 ST changes: no st elevation Impression: abnormal ekg   ____________________________________________    RADIOLOGY  None  ____________________________________________   PROCEDURES  Procedures  ____________________________________________   INITIAL IMPRESSION / ASSESSMENT AND PLAN / ED COURSE  Pertinent labs & imaging results that were available during my care of the patient were reviewed by me and considered in my medical decision making (see chart for details).  Patient presented to the emergency department today after syncopal episode and low blood pressure.  On exam patient appears well.  His blood pressure had improved upon arrival to the emergency  department.  Differential for syncopal episode would be broad including cardiac etiology, dehydration, vasovagal syncope, arrhythmia amongst other etiologies.  Patient's workup showed increase in creatinine and concern for dehydration.  Patient was given IV fluids and did feel better.  Troponin was negative.  Patient had a recent workup with 2- troponins a couple of days ago.  Discussed with the patient sounds like he does have fairly chronic alcohol use having at least a "couple of beers a day".  Does not sound that he drinks other fluids.  At this point I do think dehydration likely the most common cause of the patient's symptoms.  Had a long discussion about alcohol cessation.  Additionally discussed with patient elevation of creatinine.  Patient stated he did not want to spend the night.  Since he did feel better after the IV fluids did encourage patient to continue oral hydration.  Also urged patient to have repeat creatinine performed in the next couple of days by primary care physician.   ____________________________________________   FINAL CLINICAL IMPRESSION(S) / ED DIAGNOSES  Final diagnoses:  Syncope, unspecified syncope type  Dehydration  Note: This dictation was prepared with Dragon dictation. Any transcriptional errors that result from this process are unintentional      Nance Pear, MD 11/19/17 0003

## 2017-11-18 NOTE — ED Triage Notes (Addendum)
Pt comes via [facility] from home with c/o syncopal episode and some dizziness. Pt was found in sitting position and episode was witnessed. EMS states BP -80/50, 98% room air. EMS gave 500 bolus fluids with new BP-114/72 Pt is alert and oriented. MD at bedside

## 2017-11-18 NOTE — ED Notes (Signed)
Pt ambulated down hallway without difficulties. MD notified

## 2017-11-18 NOTE — ED Notes (Signed)
Family at bedside and updated on plan of care at this time

## 2018-03-30 ENCOUNTER — Other Ambulatory Visit: Payer: Self-pay

## 2018-03-30 ENCOUNTER — Emergency Department: Payer: Medicare Other

## 2018-03-30 ENCOUNTER — Emergency Department
Admission: EM | Admit: 2018-03-30 | Discharge: 2018-03-30 | Disposition: A | Discharge status: Home / Self Care | Payer: Medicare Other | Attending: Emergency Medicine | Admitting: Emergency Medicine

## 2018-03-30 ENCOUNTER — Encounter: Payer: Self-pay | Admitting: Emergency Medicine

## 2018-03-30 DIAGNOSIS — Z79899 Other long term (current) drug therapy: Secondary | ICD-10-CM | POA: Diagnosis not present

## 2018-03-30 DIAGNOSIS — Y999 Unspecified external cause status: Secondary | ICD-10-CM | POA: Insufficient documentation

## 2018-03-30 DIAGNOSIS — M47892 Other spondylosis, cervical region: Secondary | ICD-10-CM | POA: Insufficient documentation

## 2018-03-30 DIAGNOSIS — M436 Torticollis: Secondary | ICD-10-CM | POA: Diagnosis not present

## 2018-03-30 DIAGNOSIS — S161XXA Strain of muscle, fascia and tendon at neck level, initial encounter: Secondary | ICD-10-CM | POA: Insufficient documentation

## 2018-03-30 DIAGNOSIS — Z7982 Long term (current) use of aspirin: Secondary | ICD-10-CM | POA: Diagnosis not present

## 2018-03-30 DIAGNOSIS — Y92003 Bedroom of unspecified non-institutional (private) residence as the place of occurrence of the external cause: Secondary | ICD-10-CM | POA: Diagnosis not present

## 2018-03-30 DIAGNOSIS — Y9384 Activity, sleeping: Secondary | ICD-10-CM | POA: Diagnosis not present

## 2018-03-30 DIAGNOSIS — X58XXXA Exposure to other specified factors, initial encounter: Secondary | ICD-10-CM | POA: Insufficient documentation

## 2018-03-30 DIAGNOSIS — I10 Essential (primary) hypertension: Secondary | ICD-10-CM | POA: Diagnosis not present

## 2018-03-30 DIAGNOSIS — F17210 Nicotine dependence, cigarettes, uncomplicated: Secondary | ICD-10-CM | POA: Insufficient documentation

## 2018-03-30 DIAGNOSIS — S199XXA Unspecified injury of neck, initial encounter: Secondary | ICD-10-CM | POA: Diagnosis present

## 2018-03-30 MED ORDER — CYCLOBENZAPRINE HCL 5 MG PO TABS: 5.0000 mg | ORAL_TABLET | Freq: Two times a day (BID) | ORAL | 0 refills | Status: DC

## 2018-03-30 MED ORDER — CYCLOBENZAPRINE HCL 10 MG PO TABS
5.0000 mg | ORAL_TABLET | Freq: Once | ORAL | Status: AC
  Administered 2018-03-30: 5 mg via ORAL
  Filled 2018-03-30: qty 1

## 2018-03-30 NOTE — ED Notes (Signed)
Pain in left side of neck with movment for 2 days.  Started in middle of night.  Pain to turn left or right.  No pain with palpation of spine.

## 2018-03-30 NOTE — ED Triage Notes (Signed)
Pt reports awoke Friday morning with a stiff neck. Pt reports thought it would go away but it keeps getting worse. Pt denies SOB, Cp, Nausea or any other sx's. Pt states that he thinks she slept on it wrong.

## 2018-03-30 NOTE — ED Provider Notes (Signed)
[facility] Emergency Department Provider Note   ____________________________________________   First MD Initiated Contact with Patient 03/30/18 [phone]     (approximate)  I have reviewed the triage vital signs and the nursing notes.   HISTORY  Chief Complaint Torticollis    HPI Andres White is a 72 y.o. male patient complain of decreased range of motion of the cervical spine for 4 days.  Patient did awaken Friday morning with neck pain which is not improved.  Patient state this morning when he woke up he seemed to worsen.  Patient denies radicular component to his neck pain.  Patient rates his pain as a 5/10.  Patient described the pain is "achy".  No palliative measures for complaint.   Past Medical History:  Diagnosis Date  . Arthritis   . Chronic back pain   . Gout   . Hypertension   . Syncope and collapse     Patient Active Problem List   Diagnosis Date Noted  . Left carotid stenosis 04/13/2014  . Faintness 03/24/2014  . Chest discomfort 03/24/2014  . Hypertension     Past Surgical History:  Procedure Laterality Date  . CARDIAC CATHETERIZATION  2015   [facility]   . KNEE SURGERY      Prior to Admission medications   Medication Sig Start Date End Date Taking? Authorizing Provider  amLODipine (NORVASC) 10 MG tablet Take 10 mg by mouth daily.   Yes [provider]  aspirin EC 81 MG tablet Take 1 tablet (81 mg total) by mouth daily. 04/13/14  Yes [name], MD  citalopram (CELEXA) 20 MG tablet Take 20 mg by mouth daily.   Yes [provider]  colchicine 0.6 MG tablet Take 0.6 mg by mouth 2 (two) times daily as needed.   Yes [provider]  ibuprofen (ADVIL,MOTRIN) 800 MG tablet Take 800 mg by mouth 3 (three) times daily as needed.   Yes [provider]  lisinopril (PRINIVIL,ZESTRIL) 40 MG tablet Take 40 mg by mouth daily.   Yes [provider]  cyclobenzaprine (FLEXERIL) 5 MG tablet Take 1  tablet (5 mg total) by mouth 2 (two) times daily. 03/30/18   Sable Feil, PA-C  oxyCODONE-acetaminophen (PERCOCET/ROXICET) 5-325 MG per tablet Take 1 tablet by mouth every 8 (eight) hours as needed for severe pain.    [provider]    Allergies Patient has no known allergies.  Family History  Problem Relation Age of Onset  . Hypertension Mother     Social History Social History   Tobacco Use  . Smoking status: Current Every Day Smoker    Years: 20.00    Types: Cigarettes  . Smokeless tobacco: Never Used  Substance Use Topics  . Alcohol use: Yes    Comment: occasional  . Drug use: No    Review of Systems Constitutional: No fever/chills Eyes: No visual changes. ENT: No sore throat. Cardiovascular: Denies chest pain. Respiratory: Denies shortness of breath. Gastrointestinal: No abdominal pain.  No nausea, no vomiting.  No diarrhea.  No constipation. Genitourinary: Negative for dysuria. Musculoskeletal: Positive for neck pain pain. Skin: Negative for rash. Neurological: Negative for headaches, focal weakness or numbness. Endocrine:Hypertension and gout ____________________________________________   PHYSICAL EXAM:  VITAL SIGNS: ED Triage Vitals  Enc Vitals Group     BP 03/30/18 0905 (!) 154/89     Pulse Rate 03/30/18 0905 79     Resp 03/30/18 0905 20     Temp 03/30/18 0905  98.5 F (36.9 C)     Temp Source 03/30/18 0905 Oral     SpO2 03/30/18 0905 98 %     Weight 03/30/18 0906 195 lb (88.5 kg)     Height 03/30/18 0906 5\' 7"  (1.702 m)     Head Circumference --      Peak Flow --      Pain Score 03/30/18 0905 5     Pain Loc --      Pain Edu? --      Excl. in [place]? --     Constitutional: Alert and oriented. Well appearing and in no acute distress. Eyes: Conjunctivae are normal. PERRL. EOMI. Head: Atraumatic. Nose: No congestion/rhinnorhea. Mouth/Throat: Mucous membranes are moist.  Oropharynx non-erythematous. Neck: No stridor.  No cervical spine  tenderness to palpation.  Decreased range of motion with lateral movements; left greater than right. Hematological/Lymphatic/Immunilogical: No cervical lymphadenopathy. Cardiovascular: Normal rate, regular rhythm. Grossly normal heart sounds.  Good peripheral circulation.  Elevated blood pressure. Respiratory: Normal respiratory effort.  No retractions. Lungs CTAB. Neurologic:  Normal speech and language. No gross focal neurologic deficits are appreciated. No gait instability. Skin:  Skin is warm, dry and intact. No rash noted. Psychiatric: Mood and affect are normal. Speech and behavior are normal.  ____________________________________________   LABS (all labs ordered are listed, but only abnormal results are displayed)  Labs Reviewed - No data to display ____________________________________________  EKG   ____________________________________________  RADIOLOGY  ED MD interpretation:    Official radiology report(s): Dg Cervical Spine Complete  Result Date: 03/30/2018 CLINICAL DATA:  Awakened 4 days ago with a stiff neck. Symptoms have worsened. No other symptoms. History of arthritis. EXAM: CERVICAL SPINE - COMPLETE 4+ VIEW COMPARISON:  None in PACs FINDINGS: There is loss of the normal cervical lordosis. The cervical vertebral bodies are preserved in height. There is multilevel moderate to severe degenerative disc space narrowing and endplate osteophyte formation from C2 inferiorly. There is no perched facet or spinous process fracture. The oblique views reveal mild multilevel bony encroachment upon the neural foramina bilaterally. The odontoid is intact. The prevertebral soft tissue spaces are normal. IMPRESSION: Multilevel degenerative disc and bilateral facet joint changes consistent with osteoarthritis. No acute bony abnormality. Electronically Signed   By: David  Martinique M.D.   On: 03/30/2018 09:59    ____________________________________________   PROCEDURES  Procedure(s)  performed: None  Procedures  Critical Care performed: No  ____________________________________________   INITIAL IMPRESSION / ASSESSMENT AND PLAN / ED COURSE  As part of my medical decision making, I reviewed the following data within the electronic MEDICAL RECORD NUMBER    Neck pain secondary to torticollis.  Discussed x-ray findings with patient revealing moderate DJD.  Patient given discharge care instructions.  Patient advised take medication as directed and follow-up with PCP if no improvement in 2 to 3 days.      ____________________________________________   FINAL CLINICAL IMPRESSION(S) / ED DIAGNOSES  Final diagnoses:  Cervical strain, acute, initial encounter  Torticollis  Other osteoarthritis of spine, cervical region     ED Discharge Orders         Ordered    cyclobenzaprine (FLEXERIL) 5 MG tablet  2 times daily     03/30/18 1027           Note:  This document was prepared using Dragon voice recognition software and may include unintentional dictation errors.    Sable Feil, PA-C 03/30/18 1046    Lavonia Drafts, MD 03/30/18 1134

## 2020-06-04 DIAGNOSIS — I639 Cerebral infarction, unspecified: Secondary | ICD-10-CM

## 2020-06-04 HISTORY — DX: Cerebral infarction, unspecified: I63.9

## 2020-06-24 ENCOUNTER — Other Ambulatory Visit: Payer: Self-pay

## 2020-06-24 ENCOUNTER — Inpatient Hospital Stay: Payer: Medicare Other

## 2020-06-24 ENCOUNTER — Emergency Department: Payer: Medicare Other

## 2020-06-24 ENCOUNTER — Inpatient Hospital Stay
Admission: EM | Admit: 2020-06-24 | Discharge: 2020-06-27 | DRG: 065 | Disposition: A | Discharge status: Rehabilitation Facility | Payer: Medicare Other | Attending: Internal Medicine | Admitting: Internal Medicine

## 2020-06-24 ENCOUNTER — Encounter: Payer: Self-pay | Admitting: Emergency Medicine

## 2020-06-24 DIAGNOSIS — Z7982 Long term (current) use of aspirin: Secondary | ICD-10-CM | POA: Diagnosis not present

## 2020-06-24 DIAGNOSIS — Z20822 Contact with and (suspected) exposure to covid-19: Secondary | ICD-10-CM | POA: Diagnosis present

## 2020-06-24 DIAGNOSIS — I471 Supraventricular tachycardia: Secondary | ICD-10-CM | POA: Diagnosis not present

## 2020-06-24 DIAGNOSIS — R29898 Other symptoms and signs involving the musculoskeletal system: Secondary | ICD-10-CM

## 2020-06-24 DIAGNOSIS — G8311 Monoplegia of lower limb affecting right dominant side: Secondary | ICD-10-CM | POA: Diagnosis present

## 2020-06-24 DIAGNOSIS — F17210 Nicotine dependence, cigarettes, uncomplicated: Secondary | ICD-10-CM | POA: Diagnosis present

## 2020-06-24 DIAGNOSIS — I63232 Cerebral infarction due to unspecified occlusion or stenosis of left carotid arteries: Secondary | ICD-10-CM | POA: Diagnosis present

## 2020-06-24 DIAGNOSIS — Z79899 Other long term (current) drug therapy: Secondary | ICD-10-CM | POA: Diagnosis not present

## 2020-06-24 DIAGNOSIS — R29703 NIHSS score 3: Secondary | ICD-10-CM | POA: Diagnosis present

## 2020-06-24 DIAGNOSIS — I472 Ventricular tachycardia: Secondary | ICD-10-CM | POA: Diagnosis present

## 2020-06-24 DIAGNOSIS — M109 Gout, unspecified: Secondary | ICD-10-CM | POA: Diagnosis present

## 2020-06-24 DIAGNOSIS — I10 Essential (primary) hypertension: Secondary | ICD-10-CM | POA: Diagnosis present

## 2020-06-24 DIAGNOSIS — I639 Cerebral infarction, unspecified: Secondary | ICD-10-CM | POA: Diagnosis present

## 2020-06-24 DIAGNOSIS — F1010 Alcohol abuse, uncomplicated: Secondary | ICD-10-CM | POA: Diagnosis present

## 2020-06-24 DIAGNOSIS — R29810 Facial weakness: Secondary | ICD-10-CM | POA: Diagnosis present

## 2020-06-24 LAB — COMPREHENSIVE METABOLIC PANEL
ALT: 37 U/L (ref 0–44)
AST: 50 U/L — ABNORMAL HIGH (ref 15–41)
Albumin: 4.2 g/dL (ref 3.5–5.0)
Alkaline Phosphatase: 78 U/L (ref 38–126)
Anion gap: 11 (ref 5–15)
BUN: 16 mg/dL (ref 8–23)
CO2: 22 mmol/L (ref 22–32)
Calcium: 9.7 mg/dL (ref 8.9–10.3)
Chloride: 109 mmol/L (ref 98–111)
Creatinine, Ser: 1.13 mg/dL (ref 0.61–1.24)
GFR, Estimated: >60 mL/min (ref >60)
Glucose, Bld: 98 mg/dL (ref 70–99)
Potassium: 3.6 mmol/L (ref 3.5–5.1)
Sodium: 142 mmol/L (ref 135–145)
Total Bilirubin: 1 mg/dL (ref 0.3–1.2)
Total Protein: 8.5 g/dL — ABNORMAL HIGH (ref 6.5–8.1)

## 2020-06-24 LAB — CBC
HCT: 44.1 % (ref 39.0–52.0)
Hemoglobin: 14.2 g/dL (ref 13.0–17.0)
MCH: 30.5 pg (ref 26.0–34.0)
MCHC: 32.2 g/dL (ref 30.0–36.0)
MCV: 94.8 fL (ref 80.0–100.0)
Platelets: 176 10*3/uL (ref 150–400)
RBC: 4.65 MIL/uL (ref 4.22–5.81)
RDW: 13.1 % (ref 11.5–15.5)
WBC: 7.8 10*3/uL (ref 4.0–10.5)
nRBC: 0 % (ref 0.0–0.2)

## 2020-06-24 LAB — RESP PANEL BY RT-PCR (FLU A&B, COVID) ARPGX2
Influenza A by PCR: NEGATIVE
Influenza B by PCR: NEGATIVE
SARS Coronavirus 2 by RT PCR: NEGATIVE

## 2020-06-24 LAB — TROPONIN I (HIGH SENSITIVITY)
Troponin I (High Sensitivity): 17 ng/L (ref <18)
Troponin I (High Sensitivity): 16 ng/L

## 2020-06-24 LAB — APTT: aPTT: 28 s (ref 24–36)

## 2020-06-24 LAB — PROTIME-INR
INR: 1.1 (ref 0.8–1.2)
Prothrombin Time: 13.7 s (ref 11.4–15.2)

## 2020-06-24 MED ORDER — ACETAMINOPHEN 325 MG PO TABS: 650.0000 mg | ORAL_TABLET | ORAL | Status: DC | PRN

## 2020-06-24 MED ORDER — ENOXAPARIN SODIUM 40 MG/0.4ML ~~LOC~~ SOLN
40.0000 mg | SUBCUTANEOUS | Status: DC
  Administered 2020-06-24 – 2020-06-26 (×3): 40 mg via SUBCUTANEOUS
  Filled 2020-06-24 (×3): qty 0.4

## 2020-06-24 MED ORDER — ASPIRIN 81 MG PO CHEW
324.0000 mg | CHEWABLE_TABLET | Freq: Once | ORAL | Status: AC
  Administered 2020-06-24: 243 mg via ORAL
  Filled 2020-06-24: qty 4

## 2020-06-24 MED ORDER — ACETAMINOPHEN 650 MG RE SUPP: 650.0000 mg | RECTAL | Status: DC | PRN

## 2020-06-24 MED ORDER — STROKE: EARLY STAGES OF RECOVERY BOOK: Freq: Once | Status: AC

## 2020-06-24 MED ORDER — ASPIRIN EC 81 MG PO TBEC
81.0000 mg | DELAYED_RELEASE_TABLET | Freq: Every day | ORAL | Status: DC
  Administered 2020-06-25 – 2020-06-27 (×3): 81 mg via ORAL
  Filled 2020-06-24 (×3): qty 1

## 2020-06-24 MED ORDER — HYDRALAZINE HCL 20 MG/ML IJ SOLN
10.0000 mg | Freq: Four times a day (QID) | INTRAMUSCULAR | Status: DC | PRN
  Administered 2020-06-24 – 2020-06-25 (×2): 10 mg via INTRAVENOUS
  Filled 2020-06-24 (×2): qty 1

## 2020-06-24 MED ORDER — ACETAMINOPHEN 160 MG/5ML PO SOLN
650.0000 mg | ORAL | Status: DC | PRN
  Filled 2020-06-24: qty 20.3

## 2020-06-24 MED ORDER — SENNOSIDES-DOCUSATE SODIUM 8.6-50 MG PO TABS: 1.0000 | ORAL_TABLET | Freq: Every evening | ORAL | Status: DC | PRN

## 2020-06-24 MED ORDER — CITALOPRAM HYDROBROMIDE 20 MG PO TABS
20.0000 mg | ORAL_TABLET | Freq: Every day | ORAL | Status: DC
  Administered 2020-06-25 – 2020-06-27 (×3): 20 mg via ORAL
  Filled 2020-06-24 (×3): qty 1

## 2020-06-24 NOTE — ED Provider Notes (Signed)
[facility] EMERGENCY DEPARTMENT Provider Note   CSN: [id number] Arrival date & time: 06/24/20  1039     History Chief Complaint  Patient presents with  . Leg Pain    Andres White is a 74 y.o. male presents to the emergency department for evaluation of right leg weakness.  Patient states Wednesday morning, 4 days ago he woke up and his right leg was weak his wife also notes that his left eye was closed and he had a hard time opening his left eye.  He admits to having some intermittent vision changes out of the left eye, no headaches, fevers chills.  No chest pain or shortness of breath.  He denies any pain throughout the upper or lower extremities with no neck pain.  Patient has a history of hypertension, gout, tobacco abuse.  No history of prior stroke.  Patient's main complaint today is his right leg weakness.  HPI     Past Medical History:  Diagnosis Date  . Arthritis   . Chronic back pain   . Gout   . Hypertension   . Syncope and collapse     Patient Active Problem List   Diagnosis Date Noted  . Left carotid stenosis 04/13/2014  . Faintness 03/24/2014  . Chest discomfort 03/24/2014  . Hypertension     Past Surgical History:  Procedure Laterality Date  . CARDIAC CATHETERIZATION  2015   [facility]   . KNEE SURGERY         Family History  Problem Relation Age of Onset  . Hypertension Mother     Social History   Tobacco Use  . Smoking status: Current Every Day Smoker    Years: 20.00    Types: Cigarettes  . Smokeless tobacco: Never Used  Substance Use Topics  . Alcohol use: Yes    Comment: occasional  . Drug use: No    Home Medications Prior to Admission medications   Medication Sig Start Date End Date Taking? Authorizing Provider  amLODipine (NORVASC) 10 MG tablet Take 10 mg by mouth daily.    [provider]  aspirin EC 81 MG tablet Take 1 tablet (81 mg total) by mouth daily. 04/13/14   [name], MD  citalopram  (CELEXA) 20 MG tablet Take 20 mg by mouth daily.    [provider]  colchicine 0.6 MG tablet Take 0.6 mg by mouth 2 (two) times daily as needed.    [provider]  cyclobenzaprine (FLEXERIL) 5 MG tablet Take 1 tablet (5 mg total) by mouth 2 (two) times daily. 03/30/18   Sable Feil, PA-C  ibuprofen (ADVIL,MOTRIN) 800 MG tablet Take 800 mg by mouth 3 (three) times daily as needed.    [provider]  lisinopril (PRINIVIL,ZESTRIL) 40 MG tablet Take 40 mg by mouth daily.    [provider]  oxyCODONE-acetaminophen (PERCOCET/ROXICET) 5-325 MG per tablet Take 1 tablet by mouth every 8 (eight) hours as needed for severe pain.    [provider]    Allergies    Patient has no known allergies.  Review of Systems   Review of Systems  Constitutional: Negative for chills and fever.  HENT: Negative for facial swelling.   Eyes: Negative for photophobia, discharge, redness and visual disturbance.  Respiratory: Negative for cough, chest tightness and shortness of breath.   Cardiovascular: Negative for chest pain.  Gastrointestinal: Negative for abdominal pain, diarrhea, nausea and vomiting.  Musculoskeletal: Positive for gait problem. Negative for arthralgias,  back pain and joint swelling.  Skin: Negative for rash and wound.  Neurological: Positive for facial asymmetry and weakness. Negative for dizziness, speech difficulty, light-headedness, numbness and headaches.    Physical Exam Updated Vital Signs BP (!) 161/88 (BP Location: Left Arm)   Pulse (!) 59   Temp 97.6 F (36.4 C) (Oral)   Resp 16   Ht 5\' 7"  (1.702 m)   Wt 74.4 kg   SpO2 95%   BMI 25.69 kg/m   Physical Exam Constitutional:      Appearance: He is well-developed.  HENT:     Head: Normocephalic and atraumatic.     Right Ear: External ear normal.     Left Ear: External ear normal.     Nose: Nose normal.  Eyes:     Extraocular Movements: Extraocular movements intact.      Conjunctiva/sclera: Conjunctivae normal.     Comments: Pupils are fixed bilaterally.  Cardiovascular:     Rate and Rhythm: Normal rate.     Pulses: Normal pulses.     Heart sounds: Normal heart sounds.  Pulmonary:     Effort: Pulmonary effort is normal. No respiratory distress.     Breath sounds: Normal breath sounds.  Abdominal:     General: There is no distension.     Palpations: Abdomen is soft.     Tenderness: There is no abdominal tenderness.  Musculoskeletal:     Cervical back: Normal range of motion. No tenderness.     Comments: Examination of the right lower extremity shows patient has minimal stiffness of the right hip internal rotation but no pain with hip range of motion.  He is able to straight leg raise.  He has normal sensation throughout the right leg.  He has weakness with ankle plantarflexion dorsiflexion but is able to perform active range of motion exercises.  He has normal sensation throughout.  No wounds throughout the right lower extremity.  He has faint dorsalis pedis pulses on the right side.  Has a history of a skin graft to the right lateral tib-fib region that was performed many years ago no signs of infection.  Negative Homans' sign.  Skin:    General: Skin is warm.     Findings: No rash.  Neurological:     Mental Status: He is alert and oriented to person, place, and time. Mental status is at baseline.     Motor: Weakness present.     Coordination: Coordination normal.     Gait: Gait abnormal.     Comments: Slight left-sided facial droop with weakness of left eye muscles, able to raise the left brow symmetrically.  He has a little bit of weakness in the left facial muscles with smiling.  Tongue does not deviate to the right.  Psychiatric:        Mood and Affect: Mood normal.        Behavior: Behavior normal.        Thought Content: Thought content normal.     ED Results / Procedures / Treatments   Labs (all labs ordered are listed, but only abnormal  results are displayed) Labs Reviewed  COMPREHENSIVE METABOLIC PANEL - Abnormal; Notable for the following components:      Result Value   Total Protein 8.5 (*)    AST 50 (*)    All other components within normal limits  RESP PANEL BY RT-PCR (FLU A&B, COVID) ARPGX2  CBC  PROTIME-INR  APTT  URINE DRUG SCREEN, QUALITATIVE ([facility] ONLY)  URINALYSIS, ROUTINE W REFLEX MICROSCOPIC  TROPONIN I (HIGH SENSITIVITY)  TROPONIN I (HIGH SENSITIVITY)    EKG None  Radiology CT Head Wo Contrast  Result Date: 06/24/2020 CLINICAL DATA:  Right leg pain EXAM: CT HEAD WITHOUT CONTRAST TECHNIQUE: Contiguous axial images were obtained from the base of the skull through the vertex without intravenous contrast. COMPARISON:  None. FINDINGS: Brain: There is no acute intracranial hemorrhage, mass effect, or edema. No definite acute appearing loss of gray-white differentiation. There are multiple chronic infarcts including involvement of the left frontal lobe, right parietotemporal lobe, and bilateral cerebellar hemispheres. Age-indeterminate though probably chronic infarct of the left thalamus. Additional patchy and confluent areas of hypoattenuation in the supratentorial white matter are nonspecific but probably reflect moderate chronic microvascular ischemic changes. There is no hydrocephalus.  No extra-axial collection. Vascular: There is atherosclerotic calcification at the skull base. Skull: Calvarium is unremarkable. Sinuses/Orbits: No acute finding. Other: None. IMPRESSION: No acute intracranial hemorrhage or mass effect. Multiple chronic infarcts and moderate chronic microvascular ischemic changes. Age indeterminate though probably chronic infarct of the left thalamus. Electronically Signed   By: Macy Mis M.D.   On: 06/24/2020 13:29   MR BRAIN WO CONTRAST  Result Date: 06/24/2020 CLINICAL DATA:  Right leg pain, cannot bear weight EXAM: MRI HEAD WITHOUT CONTRAST TECHNIQUE: Multiplanar, multiecho pulse  sequences of the brain and surrounding structures were obtained without intravenous contrast. COMPARISON:  None. FINDINGS: Brain: There is a small acute infarct of the parasagittal left midbrain. Discontiguous small acute infarct of the left cerebral peduncle. Scattered chronic infarcts including involvement of the left frontal and parietal lobes, right parietotemporal lobe, bilateral thalamus, and bilateral cerebellum. There is minimal susceptibility associated with the right parietotemporal infarct likely reflecting chronic blood products. Additional patchy and confluent areas of T2 hyperintensity in the supratentorial white matter are nonspecific but probably reflect moderate chronic microvascular ischemic changes. There is no intracranial mass or mass effect. There is no hydrocephalus or extra-axial fluid collection. Vascular: Major vessel flow voids at the skull base are preserved. Skull and upper cervical spine: Normal marrow signal is preserved. Sinuses/Orbits: Paranasal sinuses are aerated. Orbits are unremarkable. Other: Sella is unremarkable.  Mastoid air cells are clear. IMPRESSION: Small acute infarcts of the left midbrain. Several chronic infarcts as described. Moderate chronic microvascular ischemic changes. Electronically Signed   By: Macy Mis M.D.   On: 06/24/2020 16:19    Procedures Procedures (including critical care time)  Medications Ordered in ED Medications  aspirin chewable tablet 324 mg (243 mg Oral Given 06/24/20 1820)    ED Course  I have reviewed the triage vital signs and the nursing notes.  Pertinent labs & imaging results that were available during my care of the patient were reviewed by me and considered in my medical decision making (see chart for details).    MDM Rules/Calculators/A&P                          74 year old male with weakness in the right leg and left facial weakness.  No trauma or injury.  Vital signs are stable.  CT showed age-indeterminate  infarct, MRI confirmed small acute infarcts to the left midbrain.  Labs are stable.  Discussed with hospitalist, will admit patient for acute stroke. Final Clinical Impression(s) / ED Diagnoses Final diagnoses:  Cerebrovascular accident (CVA), unspecified mechanism ([place])  Right leg weakness  Facial weakness    Rx / DC Orders ED Discharge Orders  None       Renata Caprice 06/24/20 Yevette Edwards    Blake Divine, MD 06/25/20 [phone]

## 2020-06-24 NOTE — H&P (Signed)
History and Physical    Andres White TKP:[id number] DOB: 02-22-1946 DOA: 06/24/2020  I have briefly reviewed the patient's prior medical records in [place]  PCP: [name], MD  Patient coming from: home  Chief Complaint: Right leg weakness  HPI: Andres White is a 74 y.o. male with medical history significant of hypertension, arthritis, tobacco use, comes to the hospital with complaints of right leg weakness.  Patient tells me that he has been weak in his right leg since Wednesday, for about 4 days.  He noticed that his balance has been affected and he has difficulties walking.  His friend who is in the room also says that his speech has been a little bit different however patient cannot appreciate that.  He denies any numbness or tingling in his legs.  He denies any right arm weakness.  He tells me that he appreciates some improvement since Wednesday but because he was still persistent decided to come to the hospital.  He denies any chest pain, denies any palpitations.  He is still smoking, and drinks 1 can of beer per day.  He denies any abdominal pain, nausea or vomiting.  No fever or chills.  ED Course: In the ED patient is afebrile, blood pressure 517G-017C systolic, satting well on room air.  His blood work is essentially unremarkable, high-sensitivity troponin is negative, INR 1.1, normal renal function and normal CBC.  Covid is negative.An MRI of the brain showed small acute infarcts in the left midbrain as well as several chronic infarcts.  EKG personally reviewed, very poor tracing but appears to be sinus bradycardia.  We are asked to admit for acute CVA  Review of Systems: All systems reviewed, and apart from HPI, all negative  Past Medical History:  Diagnosis Date  . Arthritis   . Chronic back pain   . Gout   . Hypertension   . Syncope and collapse     Past Surgical History:  Procedure Laterality Date  . CARDIAC CATHETERIZATION  2015   [facility]   . KNEE  SURGERY       reports that he has been smoking cigarettes. He has smoked for the past 20.00 years. He has never used smokeless tobacco. He reports current alcohol use. He reports that he does not use drugs.  No Known Allergies  Family History  Problem Relation Age of Onset  . Hypertension Mother     Prior to Admission medications   Medication Sig Start Date End Date Taking? Authorizing Provider  amLODipine (NORVASC) 10 MG tablet Take 10 mg by mouth daily.    [provider]  aspirin EC 81 MG tablet Take 1 tablet (81 mg total) by mouth daily. 04/13/14   [name], MD  citalopram (CELEXA) 20 MG tablet Take 20 mg by mouth daily.    [provider]  colchicine 0.6 MG tablet Take 0.6 mg by mouth 2 (two) times daily as needed.    [provider]  cyclobenzaprine (FLEXERIL) 5 MG tablet Take 1 tablet (5 mg total) by mouth 2 (two) times daily. 03/30/18   Sable Feil, PA-C  ibuprofen (ADVIL,MOTRIN) 800 MG tablet Take 800 mg by mouth 3 (three) times daily as needed.    [provider]  lisinopril (PRINIVIL,ZESTRIL) 40 MG tablet Take 40 mg by mouth daily.    [provider]  oxyCODONE-acetaminophen (PERCOCET/ROXICET) 5-325 MG per tablet Take 1 tablet by mouth every 8 (eight) hours as needed for severe pain.  [provider]    Physical Exam: Vitals:   06/24/20 1118 06/24/20 1120 06/24/20 1700  BP: (!) 153/89  (!) 161/88  Pulse: (!) 58  (!) 59  Resp: 16  16  Temp: 97.6 F (36.4 C)    TempSrc: Oral    SpO2: 97%  95%  Weight:  74.4 kg   Height:  5\' 7"  (1.702 m)     Constitutional: NAD, calm, comfortable Eyes: PERRL, lids and conjunctivae normal ENMT: Mucous membranes are moist. Neck: normal, supple Respiratory: clear to auscultation bilaterally, no wheezing, no crackles. Normal respiratory effort. Cardiovascular: Regular rate and rhythm, no murmurs / rubs / gallops. No extremity edema. 2+ pedal pulses.  Abdomen: no  tenderness, no masses palpated. Bowel sounds positive.  Musculoskeletal: no clubbing / cyanosis. Normal muscle tone.  Skin: no rashes, lesions, ulcers. No induration Neurologic: CN 2-12 grossly intact.  5 mild/5 right lower extremity otherwise 5/5 rest. Psychiatric: Normal judgment and insight. Alert and oriented x 3. Normal mood.   Labs on Admission: I have personally reviewed following labs and imaging studies  CBC: Recent Labs  Lab 06/24/20 1201  WBC 7.8  HGB 14.2  HCT 44.1  MCV 94.8  PLT 381   Basic Metabolic Panel: Recent Labs  Lab 06/24/20 1201  NA 142  K 3.6  CL 109  CO2 22  GLUCOSE 98  BUN 16  CREATININE 1.13  CALCIUM 9.7   Liver Function Tests: Recent Labs  Lab 06/24/20 1201  AST 50*  ALT 37  ALKPHOS 78  BILITOT 1.0  PROT 8.5*  ALBUMIN 4.2   Coagulation Profile: Recent Labs  Lab 06/24/20 1636  INR 1.1   BNP (last 3 results) No results for input(s): PROBNP in the last 8760 hours. CBG: No results for input(s): GLUCAP in the last 168 hours. Thyroid Function Tests: No results for input(s): TSH, T4TOTAL, FREET4, T3FREE, THYROIDAB in the last 72 hours. Urine analysis:    Component Value Date/Time   COLORURINE YELLOW (A) 11/18/2017 1830   APPEARANCEUR HAZY (A) 11/18/2017 1830   APPEARANCEUR Clear 03/23/2014 2009   LABSPEC 1.010 11/18/2017 1830   LABSPEC 1.010 03/23/2014 2009   PHURINE 5.0 11/18/2017 1830   GLUCOSEU NEGATIVE 11/18/2017 1830   GLUCOSEU Negative 03/23/2014 2009   HGBUR SMALL (A) 11/18/2017 1830   [place] NEGATIVE 11/18/2017 1830   BILIRUBINUR Negative 03/23/2014 2009   KETONESUR NEGATIVE 11/18/2017 1830   PROTEINUR NEGATIVE 11/18/2017 1830   NITRITE NEGATIVE 11/18/2017 1830   LEUKOCYTESUR NEGATIVE 11/18/2017 1830   LEUKOCYTESUR Negative 03/23/2014 2009     Radiological Exams on Admission: CT Head Wo Contrast  Result Date: 06/24/2020 CLINICAL DATA:  Right leg pain EXAM: CT HEAD WITHOUT CONTRAST TECHNIQUE: Contiguous  axial images were obtained from the base of the skull through the vertex without intravenous contrast. COMPARISON:  None. FINDINGS: Brain: There is no acute intracranial hemorrhage, mass effect, or edema. No definite acute appearing loss of gray-white differentiation. There are multiple chronic infarcts including involvement of the left frontal lobe, right parietotemporal lobe, and bilateral cerebellar hemispheres. Age-indeterminate though probably chronic infarct of the left thalamus. Additional patchy and confluent areas of hypoattenuation in the supratentorial white matter are nonspecific but probably reflect moderate chronic microvascular ischemic changes. There is no hydrocephalus.  No extra-axial collection. Vascular: There is atherosclerotic calcification at the skull base. Skull: Calvarium is unremarkable. Sinuses/Orbits: No acute finding. Other: None. IMPRESSION: No acute intracranial hemorrhage or mass effect. Multiple chronic infarcts and moderate chronic microvascular ischemic changes.  Age indeterminate though probably chronic infarct of the left thalamus. Electronically Signed   By: Macy Mis M.D.   On: 06/24/2020 13:29   MR BRAIN WO CONTRAST  Result Date: 06/24/2020 CLINICAL DATA:  Right leg pain, cannot bear weight EXAM: MRI HEAD WITHOUT CONTRAST TECHNIQUE: Multiplanar, multiecho pulse sequences of the brain and surrounding structures were obtained without intravenous contrast. COMPARISON:  None. FINDINGS: Brain: There is a small acute infarct of the parasagittal left midbrain. Discontiguous small acute infarct of the left cerebral peduncle. Scattered chronic infarcts including involvement of the left frontal and parietal lobes, right parietotemporal lobe, bilateral thalamus, and bilateral cerebellum. There is minimal susceptibility associated with the right parietotemporal infarct likely reflecting chronic blood products. Additional patchy and confluent areas of T2 hyperintensity in the  supratentorial white matter are nonspecific but probably reflect moderate chronic microvascular ischemic changes. There is no intracranial mass or mass effect. There is no hydrocephalus or extra-axial fluid collection. Vascular: Major vessel flow voids at the skull base are preserved. Skull and upper cervical spine: Normal marrow signal is preserved. Sinuses/Orbits: Paranasal sinuses are aerated. Orbits are unremarkable. Other: Sella is unremarkable.  Mastoid air cells are clear. IMPRESSION: Small acute infarcts of the left midbrain. Several chronic infarcts as described. Moderate chronic microvascular ischemic changes. Electronically Signed   By: Macy Mis M.D.   On: 06/24/2020 16:19    Assessment/Plan  Principal Problem Acute CVA-admit on stroke pathway, MRI was positive for acute stroke.  Consult neurology not emergently in a.m., obtain 2D echo, carotids, lipid panel, PT/OT -Cardiac monitoring while hospitalized, repeat EKG due to poor tracing -Continue aspirin  Active Problems Essential hypertension-hold home medications to allow permissive hypertension  Tobacco use-patient was counseled for cessation  Alcohol use-denies any history of withdrawals, drinks 1 small can of beers per day, this is confirmed by patient's friend who is at bedside.  No history of alcohol abuse.  Monitor, hold on CIWA for now  DVT prophylaxis: Lovenox Code Status: Full code Family Communication: Friend at bedside Disposition Plan: Home when ready Bed Type: MedSurg with cardiac monitoring Consults called: Neurology via secure chat, d/w Dr Curly [place] Obs/Inp: Inpatient due to acute stroke on MRI  At the time of admission, it appears that the appropriate admission status for this patient is INPATIENT as it is expected that patient will require hospital care > 2 midnights. This is judged to be reasonable and necessary in order to provide the required intensity of service to ensure the patient's safety given:  presenting symptoms, initial radiographic and laboratory data and in the context of their chronic comorbidities. Together, these circumstances are felt to place patient at high at high risk for further clinical deterioration threatening life, limb, or organ.  Marzetta Board, MD, PhD [facility]  Contact via [email or web address]  06/24/2020, 6:58 PM

## 2020-06-24 NOTE — ED Triage Notes (Signed)
Pt to ED via POV stating that he is having pain in his right leg since Tuesday. Pt reports not being able to walk on his leg or to stand. Pt able to stand unassisted in triage to take his jacket off. Pt denies injuries to the leg. Pt states that he woke up on Tuesday morning with his leg hurting. Pt denies swelling in his leg. Pt is in NAD.

## 2020-06-25 ENCOUNTER — Inpatient Hospital Stay
Admit: 2020-06-25 | Discharge: 2020-06-25 | Disposition: A | Discharge status: Home / Self Care | Payer: Medicare Other | Attending: Internal Medicine | Admitting: Internal Medicine

## 2020-06-25 ENCOUNTER — Inpatient Hospital Stay: Admit: 2020-06-25 | Payer: Medicare Other

## 2020-06-25 ENCOUNTER — Telehealth: Payer: Self-pay

## 2020-06-25 DIAGNOSIS — I639 Cerebral infarction, unspecified: Secondary | ICD-10-CM

## 2020-06-25 DIAGNOSIS — R29898 Other symptoms and signs involving the musculoskeletal system: Secondary | ICD-10-CM

## 2020-06-25 LAB — URINALYSIS, ROUTINE W REFLEX MICROSCOPIC
Bacteria, UA: NONE SEEN
Bilirubin Urine: NEGATIVE
Glucose, UA: NEGATIVE mg/dL
Hgb urine dipstick: NEGATIVE
Ketones, ur: 20 mg/dL — AB
Nitrite: NEGATIVE
Protein, ur: NEGATIVE mg/dL
Specific Gravity, Urine: 1.014 (ref 1.005–1.030)
pH: 5 (ref 5.0–8.0)

## 2020-06-25 LAB — URINE DRUG SCREEN, QUALITATIVE (ARMC ONLY)
Amphetamines, Ur Screen: NOT DETECTED
Barbiturates, Ur Screen: NOT DETECTED
Benzodiazepine, Ur Scrn: NOT DETECTED
Cannabinoid 50 Ng, Ur ~~LOC~~: NOT DETECTED
Cocaine Metabolite,Ur ~~LOC~~: NOT DETECTED
MDMA (Ecstasy)Ur Screen: NOT DETECTED
Methadone Scn, Ur: NOT DETECTED
Opiate, Ur Screen: NOT DETECTED
Phencyclidine (PCP) Ur S: NOT DETECTED
Tricyclic, Ur Screen: NOT DETECTED

## 2020-06-25 LAB — LIPID PANEL
Cholesterol: 96 mg/dL (ref 0–200)
HDL: 47 mg/dL (ref >40)
LDL Cholesterol: 32 mg/dL (ref 0–99)
Total CHOL/HDL Ratio: 2 RATIO
Triglycerides: 87 mg/dL (ref <150)
VLDL: 17 mg/dL (ref 0–40)

## 2020-06-25 LAB — ECHOCARDIOGRAM COMPLETE
Height: 67 in
S' Lateral: 2.34 cm
Weight: 2624 oz

## 2020-06-25 LAB — HEMOGLOBIN A1C
Hgb A1c MFr Bld: 4.8 % (ref 4.8–5.6)
Mean Plasma Glucose: 91.06 mg/dL

## 2020-06-25 MED ORDER — LISINOPRIL 20 MG PO TABS
40.0000 mg | ORAL_TABLET | Freq: Every day | ORAL | Status: DC
  Administered 2020-06-25 – 2020-06-26 (×2): 40 mg via ORAL
  Filled 2020-06-25 (×3): qty 2

## 2020-06-25 MED ORDER — ATORVASTATIN CALCIUM 20 MG PO TABS
40.0000 mg | ORAL_TABLET | Freq: Every day | ORAL | Status: DC
  Administered 2020-06-25 – 2020-06-27 (×3): 40 mg via ORAL
  Filled 2020-06-25 (×3): qty 2

## 2020-06-25 MED ORDER — TAMSULOSIN HCL 0.4 MG PO CAPS
0.4000 mg | ORAL_CAPSULE | Freq: Every day | ORAL | Status: DC
  Administered 2020-06-25 – 2020-06-27 (×3): 0.4 mg via ORAL
  Filled 2020-06-25 (×3): qty 1

## 2020-06-25 MED ORDER — AMLODIPINE BESYLATE 10 MG PO TABS
10.0000 mg | ORAL_TABLET | Freq: Every day | ORAL | Status: DC
  Administered 2020-06-25 – 2020-06-26 (×2): 10 mg via ORAL
  Filled 2020-06-25 (×3): qty 1

## 2020-06-25 NOTE — Progress Notes (Signed)
PROGRESS NOTE    Andres White  ACZ:[id number] DOB: [birth year] DOA: 06/24/2020 PCP: [name], MD   Brief Narrative: Taken from H&P. Andres White is a 74 y.o. male with medical history significant of hypertension, arthritis, tobacco use, comes to the hospital with complaints of right leg weakness.  Patient tells me that he has been weak in his right leg since Wednesday, for about 4 days.  He noticed that his balance has been affected and he has difficulties walking.  His friend who is in the room also says that his speech has been a little bit different however patient cannot appreciate that.  He denies any numbness or tingling in his legs.  He denies any right arm weakness.  MRI brain with a small acute infarct in the left midbrain as well as several chronic infarcts.  PT recommended CIR, patient is being accepted for CIR pending insurance authorization. 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  Neurology evaluated him and they are recommending outpatient vascular surgery follow-up. Echocardiogram with normal EF, no wall motion abnormalities and grade 1 diastolic dysfunction.  No obvious source of cardiac emboli. Patient had recurrent small nonsustained episodes of V. tach.  Remain asymptomatic.  He was given a cardiac monitor and will follow up with cardiology as an outpatient.  Subjective: Patient has no new complaint today.  Continues to have right leg weakness.  Daughter was at bedside.  They both were interested in pursuing CIR as recommended by PT.  Denies any chest pain, palpitations or shortness of breath.  Assessment & Plan:   Active Problems:   CVA (cerebral vascular accident) ([place])  Acute CVA with left midbrain infarct.  Patient with hypertension. PT is recommending CIR-patient is being accepted for inpatient rehab, awaiting insurance authorization. Neurology evaluated him and they are also recommending  outpatient vascular surgery follow-up for left ICA stenosis. -He was started on aspirin and Lipitor. -He was counseled for smoking cessation.  Essential hypertension.  Blood pressure elevated.  Initially his home antihypertensives were held for permissive hypertension. -Restarting home antihypertensives as symptoms are going on for few days now. -Continue to monitor.    Alcohol use.  Patient denies any withdrawal.  Drinks 1 can of beer per day. -Continue to monitor. -Can be placed on CIWA protocol if needed.  Objective: Vitals:   06/25/20 0600 06/25/20 0619 06/25/20 0853 06/25/20 1300  BP: (!) 194/115 (!) 194/115 (!) 158/79 (!) 152/80  Pulse: (!) 48  85 87  Resp: 18  16 17   Temp: 97.9 F (36.6 C)  97.6 F (36.4 C) 97.8 F (36.6 C)  TempSrc: Oral  Oral Oral  SpO2: 96%  98%   Weight:      Height:        Intake/Output Summary (Last 24 hours) at 06/25/2020 1342 Last data filed at 06/25/2020 0600 Gross per 24 hour  Intake 480 ml  Output 300 ml  Net 180 ml   Filed Weights   06/24/20 1120  Weight: 74.4 kg    Examination:  General exam: Well-developed gentleman, appears calm and comfortable  Respiratory system: Clear to auscultation. Respiratory effort normal. Cardiovascular system: S1 & S2 heard, RRR.  Gastrointestinal system: Soft, nontender, nondistended, bowel sounds positive. Central nervous system: Alert and oriented. No focal neurological deficits.Symmetric 5 x 5 power in both upper extremities, right lower with 4/5. Extremities: No edema, no cyanosis, pulses intact and symmetrical. Psychiatry: Judgement and insight appear normal.  Mood & affect appropriate.    DVT prophylaxis: Lovenox Code Status: Full Family Communication: Daughter was updated at bedside Disposition Plan:  Status is: Inpatient  Remains inpatient appropriate because:Inpatient level of care appropriate due to severity of illness   Dispo: The patient is from: Home              Anticipated d/c  is to: CIR              Anticipated d/c date is: 1 day              Patient currently is medically stable to d/c.  Waiting for insurance authorization for CIR.   Consultants:   Neurology  Procedures:  Antimicrobials:   Data Reviewed: I have personally reviewed following labs and imaging studies  CBC: Recent Labs  Lab 06/24/20 1201  WBC 7.8  HGB 14.2  HCT 44.1  MCV 94.8  PLT 657   Basic Metabolic Panel: Recent Labs  Lab 06/24/20 1201  NA 142  K 3.6  CL 109  CO2 22  GLUCOSE 98  BUN 16  CREATININE 1.13  CALCIUM 9.7   GFR: Estimated Creatinine Clearance: 52 mL/min (by C-G formula based on SCr of 1.13 mg/dL). Liver Function Tests: Recent Labs  Lab 06/24/20 1201  AST 50*  ALT 37  ALKPHOS 78  BILITOT 1.0  PROT 8.5*  ALBUMIN 4.2   No results for input(s): LIPASE, AMYLASE in the last 168 hours. No results for input(s): AMMONIA in the last 168 hours. Coagulation Profile: Recent Labs  Lab 06/24/20 1636  INR 1.1   Cardiac Enzymes: No results for input(s): CKTOTAL, CKMB, CKMBINDEX, TROPONINI in the last 168 hours. BNP (last 3 results) No results for input(s): PROBNP in the last 8760 hours. HbA1C: Recent Labs    06/25/20 0441  HGBA1C 4.8   CBG: No results for input(s): GLUCAP in the last 168 hours. Lipid Profile: Recent Labs    06/25/20 0441  CHOL 96  HDL 47  LDLCALC 32  TRIG 87  CHOLHDL 2.0   Thyroid Function Tests: No results for input(s): TSH, T4TOTAL, FREET4, T3FREE, THYROIDAB in the last 72 hours. Anemia Panel: No results for input(s): VITAMINB12, FOLATE, FERRITIN, TIBC, IRON, RETICCTPCT in the last 72 hours. Sepsis Labs: No results for input(s): PROCALCITON, LATICACIDVEN in the last 168 hours.  Recent Results (from the past 240 hour(s))  Resp Panel by RT-PCR (Flu A&B, Covid) Nasopharyngeal Swab     Status: None   Collection Time: 06/24/20  4:37 PM   Specimen: Nasopharyngeal Swab; Nasopharyngeal(NP) swabs in vial transport medium   Result Value Ref Range Status   SARS Coronavirus 2 by RT PCR NEGATIVE NEGATIVE Final    Comment: (NOTE) SARS-CoV-2 target nucleic acids are NOT DETECTED.  The SARS-CoV-2 RNA is generally detectable in upper respiratory specimens during the acute phase of infection. The lowest concentration of SARS-CoV-2 viral copies this assay can detect is 138 copies/mL. A negative result does not preclude SARS-Cov-2 infection and should not be used as the sole basis for treatment or other patient management decisions. A negative result may occur with  improper specimen collection/handling, submission of specimen other than nasopharyngeal swab, presence of viral mutation(s) within the areas targeted by this assay, and inadequate number of viral copies(<138 copies/mL). A negative result must be combined with clinical observations, patient history, and epidemiological information. The expected result is Negative.  Fact Sheet for Patients:  EntrepreneurPulse.com.au  Fact Sheet for Healthcare Providers:  IncredibleEmployment.be  This test  is no t yet approved or cleared by the [place] and  has been authorized for detection and/or diagnosis of SARS-CoV-2 by FDA under an Emergency Use Authorization (EUA). This EUA will remain  in effect (meaning this test can be used) for the duration of the COVID-19 declaration under Section 564(b)(1) of the Act, 21 U.S.C.section 360bbb-3(b)(1), unless the authorization is terminated  or revoked sooner.       Influenza A by PCR NEGATIVE NEGATIVE Final   Influenza B by PCR NEGATIVE NEGATIVE Final    Comment: (NOTE) The Xpert Xpress SARS-CoV-2/FLU/RSV plus assay is intended as an aid in the diagnosis of influenza from Nasopharyngeal swab specimens and should not be used as a sole basis for treatment. Nasal washings and aspirates are unacceptable for Xpert Xpress SARS-CoV-2/FLU/RSV testing.  Fact Sheet for  Patients: EntrepreneurPulse.com.au  Fact Sheet for Healthcare Providers: IncredibleEmployment.be  This test is not yet approved or cleared by the [place] FDA and has been authorized for detection and/or diagnosis of SARS-CoV-2 by FDA under an Emergency Use Authorization (EUA). This EUA will remain in effect (meaning this test can be used) for the duration of the COVID-19 declaration under Section 564(b)(1) of the Act, 21 U.S.C. section 360bbb-3(b)(1), unless the authorization is terminated or revoked.  Performed at [facility], [address] [place], [place] [address]      Radiology Studies: CT Head Wo Contrast  Result Date: 06/24/2020 CLINICAL DATA:  Right leg pain EXAM: CT HEAD WITHOUT CONTRAST TECHNIQUE: Contiguous axial images were obtained from the base of the skull through the vertex without intravenous contrast. COMPARISON:  None. FINDINGS: Brain: There is no acute intracranial hemorrhage, mass effect, or edema. No definite acute appearing loss of gray-white differentiation. There are multiple chronic infarcts including involvement of the left frontal lobe, right parietotemporal lobe, and bilateral cerebellar hemispheres. Age-indeterminate though probably chronic infarct of the left thalamus. Additional patchy and confluent areas of hypoattenuation in the supratentorial white matter are nonspecific but probably reflect moderate chronic microvascular ischemic changes. There is no hydrocephalus.  No extra-axial collection. Vascular: There is atherosclerotic calcification at the skull base. Skull: Calvarium is unremarkable. Sinuses/Orbits: No acute finding. Other: None. IMPRESSION: No acute intracranial hemorrhage or mass effect. Multiple chronic infarcts and moderate chronic microvascular ischemic changes. Age indeterminate though probably chronic infarct of the left thalamus. Electronically Signed   By: Macy Mis M.D.   On:  06/24/2020 13:29   MR BRAIN WO CONTRAST  Result Date: 06/24/2020 CLINICAL DATA:  Right leg pain, cannot bear weight EXAM: MRI HEAD WITHOUT CONTRAST TECHNIQUE: Multiplanar, multiecho pulse sequences of the brain and surrounding structures were obtained without intravenous contrast. COMPARISON:  None. FINDINGS: Brain: There is a small acute infarct of the parasagittal left midbrain. Discontiguous small acute infarct of the left cerebral peduncle. Scattered chronic infarcts including involvement of the left frontal and parietal lobes, right parietotemporal lobe, bilateral thalamus, and bilateral cerebellum. There is minimal susceptibility associated with the right parietotemporal infarct likely reflecting chronic blood products. Additional patchy and confluent areas of T2 hyperintensity in the supratentorial white matter are nonspecific but probably reflect moderate chronic microvascular ischemic changes. There is no intracranial mass or mass effect. There is no hydrocephalus or extra-axial fluid collection. Vascular: Major vessel flow voids at the skull base are preserved. Skull and upper cervical spine: Normal marrow signal is preserved. Sinuses/Orbits: Paranasal sinuses are aerated. Orbits are unremarkable. Other: Sella is unremarkable.  Mastoid air cells are clear. IMPRESSION: Small acute  infarcts of the left midbrain. Several chronic infarcts as described. Moderate chronic microvascular ischemic changes. Electronically Signed   By: Macy Mis M.D.   On: 06/24/2020 16:19   US Carotid Bilateral (at [facility] and [facility] only)  Result Date: 06/25/2020 CLINICAL DATA:  74 year old male with stroke EXAM: BILATERAL CAROTID DUPLEX ULTRASOUND TECHNIQUE: Pearline Cables scale imaging, color Doppler and duplex ultrasound were performed of bilateral carotid and vertebral arteries in the neck. COMPARISON:  None. FINDINGS: Criteria: Quantification of carotid stenosis is based on velocity parameters that correlate the residual internal  carotid diameter with NASCET-based stenosis levels, using the diameter of the distal internal carotid lumen as the denominator for stenosis measurement. The following velocity measurements were obtained: RIGHT ICA:  Systolic 72 cm/sec, Diastolic 17 cm/sec CCA:  90 cm/sec SYSTOLIC ICA/CCA RATIO:  0.8 ECA:  62 cm/sec LEFT ICA:  Systolic 876 cm/sec, Diastolic 38 cm/sec CCA:  72 cm/sec SYSTOLIC ICA/CCA RATIO:  3.1 ECA:  177 cm/sec Right Brachial SBP: Not acquired Left Brachial SBP: Not acquired RIGHT CAROTID ARTERY: Calcification of the right common carotid artery. Intermediate waveform maintained. Moderate heterogeneous and partially calcified plaque at the right carotid bifurcation. No significant lumen shadowing. Low resistance waveform of the right ICA. Tortuosity RIGHT VERTEBRAL ARTERY: Antegrade flow with low resistance waveform. LEFT CAROTID ARTERY: Calcification of the left common carotid artery. Intermediate waveform maintained. Moderate heterogeneous and partially calcified plaque at the left carotid bifurcation. Calcifications present, contributing to shadowing. Low resistance waveform of the left ICA. Tortuosity LEFT VERTEBRAL ARTERY:  Antegrade flow with low resistance waveform. IMPRESSION: Right: Color duplex indicates moderate heterogeneous and calcified plaque, with no hemodynamically significant stenosis by duplex criteria in the extracranial cerebrovascular circulation. Left: Heterogeneous and partially calcified plaque at the left carotid bifurcation, with discordant results regarding degree of stenosis by established duplex criteria. Peak velocity suggests 70% - 99% stenosis, with the ICA/ CCA ratio suggesting a lesser degree of stenosis. If establishing a more accurate degree of stenosis is required, cerebral angiogram should be considered, or as a second best test, CTA. Signed, Dulcy Fanny. Dellia Nims, RPVI [facility] Radiology Electronically Signed    By: [name] D.O.   On: 06/25/2020 07:45   ECHOCARDIOGRAM COMPLETE  Result Date: 06/25/2020    ECHOCARDIOGRAM REPORT   Patient Name:   Andres White Date of Exam: 06/25/2020 Medical Rec #:  [id number]      Height:       67.0 in Accession #:    [id number]     Weight:       164.0 lb Date of Birth:  13-Aug-1945      BSA:          1.859 m Patient Age:    49 years       BP:           158/79 mmHg Patient Gender: M              HR:           85 bpm. Exam Location:  [facility] Procedure: 2D Echo, Cardiac Doppler and Color Doppler Indications:     Stroke 434.91  History:         Patient has no prior history of Echocardiogram examinations.                  Signs/Symptoms:Syncope; Risk Factors:Hypertension.  Sonographer:     Sherrie Sport RDCS (AE) Referring Phys:  [id number] [facility] Jakeob Tullis Diagnosing Phys: Neoma Laming MD  Sonographer Comments: Technically difficult study due to poor echo windows, no subcostal window and no apical window. IMPRESSIONS  1. Left ventricular ejection fraction, by estimation, is 60 to 65%. The left ventricle has normal function. The left ventricle has no regional wall motion abnormalities. There is severe concentric left ventricular hypertrophy. Left ventricular diastolic  parameters are consistent with Grade I diastolic dysfunction (impaired relaxation).  2. Right ventricular systolic function is normal. The right ventricular size is normal.  3. The mitral valve is normal in structure. Mild mitral valve regurgitation. No evidence of mitral stenosis.  4. The aortic valve is normal in structure. Aortic valve regurgitation is not visualized. No aortic stenosis is present.  5. The inferior vena cava is normal in size with greater than 50% respiratory variability, suggesting right atrial pressure of 3 mmHg. FINDINGS  Left Ventricle: Left ventricular ejection fraction, by estimation, is 60 to 65%. The left ventricle has normal function. The left ventricle has no regional wall motion abnormalities. The left  ventricular internal cavity size was normal in size. There is  severe concentric left ventricular hypertrophy. Left ventricular diastolic parameters are consistent with Grade I diastolic dysfunction (impaired relaxation). Right Ventricle: The right ventricular size is normal. No increase in right ventricular wall thickness. Right ventricular systolic function is normal. Left Atrium: Left atrial size was normal in size. Right Atrium: Right atrial size was normal in size. Pericardium: There is no evidence of pericardial effusion. Mitral Valve: The mitral valve is normal in structure. Mild mitral valve regurgitation. No evidence of mitral valve stenosis. Tricuspid Valve: The tricuspid valve is normal in structure. Tricuspid valve regurgitation is mild . No evidence of tricuspid stenosis. Aortic Valve: The aortic valve is normal in structure. Aortic valve regurgitation is not visualized. No aortic stenosis is present. Pulmonic Valve: The pulmonic valve was normal in structure. Pulmonic valve regurgitation is not visualized. No evidence of pulmonic stenosis. Aorta: The aortic root is normal in size and structure. Venous: The inferior vena cava is normal in size with greater than 50% respiratory variability, suggesting right atrial pressure of 3 mmHg. IAS/Shunts: No atrial level shunt detected by color flow Doppler.  LEFT VENTRICLE PLAX 2D LVIDd:         3.79 cm LVIDs:         2.34 cm LV PW:         1.13 cm LV IVS:        1.34 cm LVOT diam:     2.00 cm LVOT Area:     3.14 cm  LEFT ATRIUM         Index LA diam:    4.40 cm 2.37 cm/m                        PULMONIC VALVE AORTA                 PV Vmax:        0.44 m/s Ao Root diam: 3.10 cm PV Peak grad:   0.8 mmHg                       RVOT Peak grad: 1 mmHg   SHUNTS Systemic Diam: 2.00 cm Neoma Laming MD Electronically signed by Neoma Laming MD Signature Date/Time: 06/25/2020/11:58:34 AM    Final     Scheduled Meds: . amLODipine  10 mg Oral Daily  . aspirin EC  81 mg  Oral Daily  . citalopram  20 mg Oral Daily  . enoxaparin (LOVENOX) injection  40 mg Subcutaneous Q24H  . lisinopril  40 mg Oral Daily  . tamsulosin  0.4 mg Oral Daily   Continuous Infusions:   LOS: 1 day   Time spent: 30 minutes.  Lorella Nimrod, MD [facility]  If 7PM-7AM, please contact night-coverage [email or web address]  06/25/2020, 1:42 PM   This record has been created using Systems analyst. Errors have been sought and corrected,but may not always be located. Such creation errors do not reflect on the standard of care.

## 2020-06-25 NOTE — Progress Notes (Signed)
Inpatient Rehab Admissions Coordinator Note:   Per PT/OT recommendations, pt was screened for CIR candidacy by Gayland Curry, MS, CCC-SLP.  At this time we are recommending an inpatient rehab consult.  AC will place consult order per protocol.   Please contact me with questions.    Gayland Curry, [place], [place] Admissions Coordinator [phone] 06/25/20 12:28 PM

## 2020-06-25 NOTE — Progress Notes (Signed)
*  PRELIMINARY RESULTS* Echocardiogram 2D Echocardiogram has been performed.  Andres White 06/25/2020, 10:53 AM

## 2020-06-25 NOTE — Evaluation (Signed)
Speech Language Pathology Evaluation Patient Details Name: Andres White MRN: [id number] DOB: [birth year] Today's Date: 06/25/2020 Time: 1210-1257 SLP Time Calculation (min) (ACUTE ONLY): 47 min  Problem List:  Patient Active Problem List   Diagnosis Date Noted   CVA (cerebral vascular accident) ([place]) 06/24/2020   Left carotid stenosis 04/13/2014   Faintness 03/24/2014   Chest discomfort 03/24/2014   Hypertension    Past Medical History:  Past Medical History:  Diagnosis Date   Arthritis    Chronic back pain    Gout    Hypertension    Syncope and collapse    Past Surgical History:  Past Surgical History:  Procedure Laterality Date   CARDIAC CATHETERIZATION  2015   [facility]    KNEE SURGERY     HPI:   Per admitting H&P "Andres White is a 74 y.o. male with medical history significant of hypertension, arthritis, tobacco use, comes to the hospital with complaints of right leg weakness.  Patient tells me that he has been weak in his right leg since Wednesday, for about 4 days.  He noticed that his balance has been affected and he has difficulties walking.  His friend who is in the room also says that his speech has been a little bit different however patient cannot appreciate that.  He denies any numbness or tingling in his legs.  He denies any right arm weakness.  He tells me that he appreciates some improvement since Wednesday but because he was still persistent decided to come to the hospital.  He denies any chest pain, denies any palpitations.  He is still smoking, and drinks 1 can of beer per day.  He denies any abdominal pain, nausea or vomiting.  No fever or chills.   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 Pt was able to communicate needs and wants adequately but did present with mild Dysarthria decreasing intelligibility. Receptive deficits included difficulty with 2 and 3 step commands,  and complex Y/N question. Pt was oriented to self, place and his current situation but unaware of time of year, season or upcoming Holiday. Pt's daughter was in the room and reported this was not normal for Andres White as he usually "knows everything".  Pt was able to name common objects around the room but had difficulty with more abstract items in conversation. No reported Dysphagia. Rec ST services at discharge for a more comprehensive Cognitive Linguistic eval and treat. Pt is being assessed for inpatient rehab for which he could definitely benefit from receiving. Prognosis for improvement is very good.    SLP Assessment  SLP Recommendation/Assessment: All further Speech Lanaguage Pathology  needs can be addressed in the next venue of care SLP Visit Diagnosis: Cognitive communication deficit (R41.841);Dysarthria and anarthria (R47.1)    Follow Up Recommendations  Inpatient Rehab    Frequency and Duration     Eval and treat at rehab center      SLP Evaluation Cognition  Overall Cognitive Status: Impaired/Different from baseline Arousal/Alertness: Awake/alert Orientation Level: Oriented to person;Oriented to place Attention: Focused Focused Attention: Appears intact Memory: Impaired Memory Impairment: Decreased recall of new information;Decreased short term memory Immediate Memory Recall: Sock;Blue;Bed Memory Recall Sock: With Cue Memory Recall Blue: With Cue Memory Recall Bed: With Cue Awareness: Appears intact Executive Function: Sequencing Sequencing: Impaired       Comprehension  Auditory Comprehension Overall Auditory Comprehension: Impaired Yes/No Questions: Impaired Commands: Impaired Two Step Basic Commands:  50-74% accurate Multistep Basic Commands: 50-74% accurate Conversation: Simple Interfering Components: Processing speed Reading Comprehension Reading Status: Not tested    Expression Expression Primary Mode of Expression: Verbal Verbal Expression Overall  Verbal Expression: Impaired Initiation: No impairment Repetition: No impairment Naming: Impairment Responsive: 76-100% accurate Confrontation: Impaired Convergent: 75-100% accurate Written Expression Dominant Hand: Right Written Expression: Not tested   Oral / Motor  Oral Motor/Sensory Function Overall Oral Motor/Sensory Function: Within functional limits Motor Speech Respiration: Within functional limits Phonation: Hoarse Resonance: Within functional limits Articulation: Impaired Level of Impairment: Sentence Intelligibility: Intelligibility reduced Word: 75-100% accurate Phrase: 75-100% accurate Sentence: 75-100% accurate Conversation: 75-100% accurate Motor Planning: Not tested   GO                    Lucila Maine 06/25/2020, 1:48 PM

## 2020-06-25 NOTE — Evaluation (Signed)
Occupational Therapy Evaluation Patient Details Name: Andres White MRN: [id number] DOB: 1946-07-13 Today's Date: 06/25/2020    History of Present Illness Pt is a 74 y.o. male with medical history significant of hypertension, arthritis, tobacco use, comes to the hospital with complaints of right leg weakness, L facial weakness noted and roommate mentioned changes in speech.[name] MRI+ for small acute infarcts in the left midbrain as well as several chronic infarcts including involvement of the left frontal and parietal lobes, right parietotemporal lobe, bilateral thalamus, and bilateral cerebellum.   Clinical Impression   Pt was seen for OT evaluation this date. Prior to hospital admission, pt was INDEP with all aspects of self care ADLs/ADL mobility with no AD. Pt lives alone in [facility] with 2 STE. Currently pt demonstrates impairments as described below (See OT problem list) which functionally limit his ability to perform ADL/self-care tasks. Pt currently requires MIN A/CGA for ADL transfers with RW (no AD use at home) and MIN/MOD A for LB dressing in sitting (usually INDEP with no AE).  Pt would benefit from skilled OT services to address noted impairments and functional limitations including education for modification/adaptation as needed (ex: AE for LB ADLs) in order to maximize safety and independence while minimizing falls risk and caregiver burden. Upon hospital discharge, recommend CIR to maximize pt safety and return to functional independence during meaningful occupations of daily life.     Follow Up Recommendations  CIR    Equipment Recommendations  3 in 1 bedside commode;Tub/shower seat;Other (comment) (2WW)    Recommendations for Other Services       Precautions / Restrictions Precautions Precautions: Fall Precaution Comments: watch HR Restrictions Weight Bearing Restrictions: No      Mobility Bed Mobility Overal bed mobility: Needs Assistance Bed Mobility: Supine to Sit      Supine to sit: Supervision;HOB elevated     General bed mobility comments: pt up to chair with PT when OT presents.    Transfers Overall transfer level: Needs assistance Equipment used: Rolling walker (2 wheeled);Quad cane Transfers: Sit to/from Stand Sit to Stand: Min assist;Min guard         General transfer comment: Pt with decreased steadiness requiring B UE support and MIN A/CGA to steady once in standing and with fxl mobility.    Balance Overall balance assessment: Needs assistance Sitting-balance support: Feet supported Sitting balance-Leahy Scale: Good     Standing balance support: Single extremity supported Standing balance-Leahy Scale: Poor Standing balance comment: initially attempted with cane as pt with cane at home, but pt unable to steady himself with only 1 UE support, requires B UE support with RW to successfully perform fxl mobility.                           ADL either performed or assessed with clinical judgement   ADL Overall ADL's : Needs assistance/impaired                                       General ADL Comments: MIN/MOD A with seated LB dressing, increased RR/decreased O2 with attempts to fold at the waist in sitting for LB ADLs. SETUP for seated UB ADLs d/t decreased R UE coordination. Requires MIN A/CGA for ADL transfers/fxl mobility with RW as he needs B UE support.     Vision Patient Visual Report: No change from baseline  Perception     Praxis      Pertinent Vitals/Pain Pain Assessment: No/denies pain     Hand Dominance Right   Extremity/Trunk Assessment Upper Extremity Assessment Upper Extremity Assessment: RUE deficits/detail;LUE deficits/detail RUE Deficits / Details: R shoulder strength grossly 4/5, elbow and grip strength symmetrical to L RUE Sensation: WNL RUE Coordination: decreased fine motor;decreased gross motor LUE Deficits / Details: WFLs LUE Sensation: WNL LUE Coordination: WNL    Lower Extremity Assessment Lower Extremity Assessment: Defer to PT evaluation;RLE deficits/detail;LLE deficits/detail RLE Deficits / Details: hip flexion 4+/5, hip abduction/adduction 4+5, knee extension 4+/5 just extended time needed for TKE. ankle DF 2+/5, PF 3+/5 RLE Sensation: WNL RLE Coordination: decreased gross motor;decreased fine motor LLE Deficits / Details: WFLs LLE Sensation: WNL LLE Coordination: WNL   Cervical / Trunk Assessment Cervical / Trunk Assessment:  (flexed thoracic/cervical region in standing/with mobility. Cues to extend to full stand.) Cervical / Trunk Exceptions: Pt able to stand fully upright with cueing, predominantly hunched with ambulation   Communication Communication Communication: Expressive difficulties   Cognition Arousal/Alertness: Awake/alert Behavior During Therapy: WFL for tasks assessed/performed Overall Cognitive Status: Impaired/Different from baseline Area of Impairment: Orientation;Memory;Following commands;Problem solving                 Orientation Level: Disoriented to;Time   Memory: Decreased short-term memory Following Commands: Follows one step commands with increased time     Problem Solving: Slow processing;Requires verbal cues;Requires tactile cues;Decreased initiation     General Comments  HR fluctuated up to 130s with activity. 90-110 bpm at rest.    Exercises Other Exercises Other Exercises: OT facilitates ed with pt and dtr who is present throughout re: role of OT, safety/fall prevention considerations (use of call light, chair alarm), seated therex he could perform to improve balance including contralateral reaching and straight arm raises. Other Exercises: OT engages pt in 1 set x10 reps postural extension/sccapular retration with ~2-3 second hold to deep breathe to improve quality of breaths as pt is noted to increase RR and WOB with all activity this date.   Shoulder Instructions      Home Living  Family/patient expects to be discharged to:: Private residence Living Arrangements: Alone Available Help at Discharge: Family;Available PRN/intermittently Type of Home: House Home Access: Stairs to enter [employer] of Steps: 2   Home Layout: One level     Bathroom Shower/Tub: Teacher, early years/pre: Standard Bathroom Accessibility: Yes   Home Equipment: Cane - single point   Additional Comments: Pt reported 5 falls in the last 6 months, reported that it happens when he goes to stand up but does not feel light-headed, just unsteady      Prior Functioning/Environment Level of Independence: Independent                 OT Problem List: Decreased strength;Impaired balance (sitting and/or standing);Decreased coordination;Decreased safety awareness;Decreased knowledge of use of DME or AE;Cardiopulmonary status limiting activity      OT Treatment/Interventions: Self-care/ADL training;DME and/or AE instruction;Therapeutic activities;Balance training    OT Goals(Current goals can be found in the care plan section) Acute Rehab OT Goals Patient Stated Goal: to return to PLOF OT Goal Formulation: With patient/family Time For Goal Achievement: 07/09/20 Potential to Achieve Goals: Good ADL Goals Pt Will Perform Lower Body Dressing: with supervision;sit to/from stand (with AE PRN vs cross-leg technique to prevent SOB) Pt Will Transfer to Toilet: with supervision;ambulating;bedside commode (to restroom with LRAD with BSC over [place] commode  to elevate) Pt Will Perform Toileting - Clothing Manipulation and hygiene: with supervision;sit to/from stand Pt/caregiver will Perform Home Exercise Program: Increased strength;Both right and left upper extremity;With minimal assist  OT Frequency: Min 1X/week   Barriers to D/C:            Co-evaluation PT/OT/SLP Co-Evaluation/Treatment: Yes Reason for Co-Treatment: Complexity of the patient's impairments  (multi-system involvement);For patient/therapist safety PT goals addressed during session: Mobility/safety with mobility;Balance;Strengthening/ROM OT goals addressed during session: ADL's and self-care;Proper use of Adaptive equipment and DME      AM-PAC OT "6 Clicks" Daily Activity     Outcome Measure Help from another person eating meals?: None Help from another person taking care of personal grooming?: A Little Help from another person toileting, which includes using toliet, bedpan, or urinal?: A Little Help from another person bathing (including washing, rinsing, drying)?: A Little Help from another person to put on and taking off regular upper body clothing?: A Little Help from another person to put on and taking off regular lower body clothing?: A Lot 6 Click Score: 18   End of Session Equipment Utilized During Treatment: Gait belt;Rolling walker Nurse Communication: Mobility status  Activity Tolerance: Patient tolerated treatment well Patient left: in chair;with call bell/phone within reach;with chair alarm set  OT Visit Diagnosis: Unsteadiness on feet (R26.81);Muscle weakness (generalized) (M62.81)                Time: 1146-4314 OT Time Calculation (min): 24 min Charges:  OT General Charges $OT Visit: 1 Visit OT Evaluation $OT Eval Moderate Complexity: 1 Mod OT Treatments $Self Care/Home Management : 8-22 mins  Gerrianne Scale, MS, OTR/L ascom [phone] 06/25/20, 1:23 PM

## 2020-06-25 NOTE — TOC Initial Note (Signed)
Transition of Care [facility]) - Initial/Assessment Note    Patient Details  Name: Andres White MRN: [id number] Date of Birth: 09/29/1945  [facility]) CM/SW Contact:    Shelbie Hutching, RN Phone Number: 06/25/2020, 12:08 PM  Clinical Narrative:                 Patient admitted to the hospital with CVA.  RNCM met with patient and patient's daughter, [name], at the bedside this morning.  Patient is independent at home; he is able to complete all ADL's , he drives, and he requires no assistive devices.  Patient would like to get better and return to previous level of function.  PT and OT are recommending CIR and patient and daughter are in agreement.  TOC team will reach out to [facility] worker for them to review.    Expected Discharge Plan: IP Rehab Facility Barriers to Discharge: Continued Medical Work up   Patient Goals and CMS Choice Patient states their goals for this hospitalization and ongoing recovery are:: To get better and return to previous level of care (independent) CMS Medicare.gov Compare Post Acute Care list provided to:: Patient Choice offered to / list presented to : Patient, Adult Children  Expected Discharge Plan and Services Expected Discharge Plan: [place]   Discharge Planning Services: CM Consult Post Acute Care Choice: IP Rehab Living arrangements for the past 2 months: Single Family Home                   DME Agency: NA       HH Arranged: NA          Prior Living Arrangements/Services Living arrangements for the past 2 months: Single Family Home Lives with:: Self Patient language and need for interpreter reviewed:: Yes Do you feel safe going back to the place where you live?: Yes      Need for Family Participation in Patient Care: Yes (Comment) (stroke) Care giver support system in place?: Yes (comment) (daughter)   Criminal Activity/Legal Involvement Pertinent to Current Situation/Hospitalization: No - Comment as  needed  Activities of Daily Living Home Assistive Devices/Equipment: None ADL Screening (condition at time of admission) Patient's cognitive ability adequate to safely complete daily activities?: Yes Is the patient deaf or have difficulty hearing?: No Does the patient have difficulty seeing, even when wearing glasses/contacts?: No Does the patient have difficulty concentrating, remembering, or making decisions?: Yes Patient able to express need for assistance with ADLs?: No Does the patient have difficulty dressing or bathing?: Yes Independently performs ADLs?: No Communication: Independent Dressing (OT): Needs assistance Is this a change from baseline?: Change from baseline, expected to last >3 days Grooming: Independent Feeding: Independent Bathing: Needs assistance Is this a change from baseline?: Change from baseline, expected to last <3 days Toileting: Needs assistance Is this a change from baseline?: Change from baseline, expected to last <3 days In/Out Bed: Needs assistance Is this a change from baseline?: Change from baseline, expected to last <3 days Walks in Home: Needs assistance Is this a change from baseline?: Change from baseline, expected to last <3 days Does the patient have difficulty walking or climbing stairs?: Yes Weakness of Legs: Right Weakness of Arms/Hands: None  Permission Sought/Granted Permission sought to share information with : Case Manager, Family Supports, Chartered certified accountant granted to share information with : Yes, Verbal Permission Granted  Share Information with NAME: [name]  Permission granted to share info w AGENCY: CIR  Permission  granted to share info w Relationship: daughter     Emotional Assessment Appearance:: Appears stated age Attitude/Demeanor/Rapport: Engaged Affect (typically observed): Accepting, Pleasant Orientation: : Oriented to Self, Oriented to Place, Oriented to  Time, Oriented to Situation Alcohol  / Substance Use: Not Applicable Psych Involvement: No (comment)  Admission diagnosis:  Facial weakness [R29.810] CVA (cerebral vascular accident) ([place]) [I63.9] Right leg weakness [R29.898] Cerebrovascular accident (CVA), unspecified mechanism ([place]) [I63.9] Patient Active Problem List   Diagnosis Date Noted  . CVA (cerebral vascular accident) ([place]) 06/24/2020  . Left carotid stenosis 04/13/2014  . Faintness 03/24/2014  . Chest discomfort 03/24/2014  . Hypertension    PCP:  [name], MD Pharmacy:   [place], [place] [address] [address] Phone: [phone] Fax: [phone]     Social Determinants of Health (SDOH) Interventions    Readmission Risk Interventions No flowsheet data found.

## 2020-06-25 NOTE — Consult Note (Signed)
Physical Medicine and Rehabilitation Consult Reason for Consult: Impaired mobility and ADLs following multiple small acute strokes Referring Physician: Lorella Nimrod, MD   HPI: Andres White is a 74 y.o. male with PMH of HTN, arthritis, tobacco use, who presented with right leg weakness, L facial weakness, and speech changes. 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 Physical Medicine & Rehabilitation was consulted to assess candidacy for CIR.   Review of Systems  Constitutional: Negative.   HENT: Negative.   Eyes: Negative.   Respiratory: Negative.   Cardiovascular: Negative.   Gastrointestinal: Negative.   Genitourinary: Negative.   Musculoskeletal: Negative.   Skin: Negative.   Neurological: Positive for speech change and focal weakness.  Endo/Heme/Allergies: Negative.   Psychiatric/Behavioral: Negative.    Past Medical History:  Diagnosis Date  . Arthritis   . Chronic back pain   . Gout   . Hypertension   . Syncope and collapse    Past Surgical History:  Procedure Laterality Date  . CARDIAC CATHETERIZATION  2015   [facility]   . KNEE SURGERY     Family History  Problem Relation Age of Onset  . Hypertension Mother    Social History:  reports that he has been smoking cigarettes. He has smoked for the past 20.00 years. He has never used smokeless tobacco. He reports current alcohol use. He reports that he does not use drugs. Allergies: No Known Allergies Medications Prior to Admission  Medication Sig Dispense Refill  . amLODipine (NORVASC) 10 MG tablet Take 10 mg by mouth daily.    [name] aspirin EC 81 MG tablet Take 1 tablet (81 mg total) by mouth daily. 90 tablet 3  . atorvastatin (LIPITOR) 20 MG tablet Take 20 mg by mouth daily.    . citalopram (CELEXA) 20 MG tablet Take 20 mg by mouth daily.    [name]  ibuprofen (ADVIL,MOTRIN) 800 MG tablet Take 800 mg by mouth 3 (three) times daily as needed.    [name] lisinopril (PRINIVIL,ZESTRIL) 40 MG tablet Take 40 mg by mouth daily.    . tamsulosin (FLOMAX) 0.4 MG CAPS capsule Take 0.4 mg by mouth daily.    . colchicine 0.6 MG tablet Take 0.6 mg by mouth 2 (two) times daily as needed.      Home: Home Living Family/patient expects to be discharged to:: Private residence Living Arrangements: Alone Available Help at Discharge: Family, Available PRN/intermittently Type of Home: House Home Access: Stairs to enter Technical brewer of Steps: 2 [employer]: One level Bathroom Shower/Tub: Chiropodist: Standard Bathroom Accessibility: Yes Home Equipment: Cane - single point Additional Comments: Pt reported 5 falls in the last 6 months, reported that it happens when he goes to stand up but does not feel light-headed, just unsteady  Functional History: Prior Function Level of Independence: Independent Functional Status:  Mobility: Bed Mobility Overal bed mobility: Needs Assistance Bed Mobility: Supine to Sit Supine to sit: Supervision, HOB elevated General bed mobility comments: pt up to chair with PT when OT presents. Transfers Overall transfer level: Needs assistance Equipment used: Rolling walker (2 wheeled), Quad cane Transfers: Sit to/from Stand Sit to Stand: Min assist, Min guard General transfer comment: Pt with decreased steadiness requiring B UE support and MIN A/CGA to steady once in standing and with fxl mobility. Ambulation/Gait Ambulation/Gait assistance: Mod assist, Min  assist Gait Distance (Feet):  (61ft, seated rest break, additional 58ft with RW, seated rest break ~15ft with quad cane.) Assistive device: Rolling walker (2 wheeled), Quad cane Gait Pattern/deviations: Drifts right/left, Trunk flexed, Narrow base of support, Step-through pattern General Gait Details: Pt exhibited difficulty with proprioception of  RLE in space, narrow BOS noted with staggering/drifitng with ambulation. Pt attempted with quad canem modA for safety, significant staggering noted when attempting to terminate ambulation. Gait velocity: decreased    ADL: ADL Overall ADL's : Needs assistance/impaired General ADL Comments: MIN/MOD A with seated LB dressing, increased RR/decreased O2 with attempts to fold at the waist in sitting for LB ADLs. SETUP for seated UB ADLs d/t decreased R UE coordination. Requires MIN A/CGA for ADL transfers/fxl mobility with RW as he needs B UE support.  Cognition: Cognition Overall Cognitive Status: Impaired/Different from baseline Arousal/Alertness: Awake/alert Orientation Level: Oriented to person, Oriented to place Attention: Focused Focused Attention: Appears intact Memory: Impaired Memory Impairment: Decreased recall of new information, Decreased short term memory Immediate Memory Recall: Sock, Oren Binet, Bed Memory Recall Sock: With Cue Memory Recall Blue: With Cue Memory Recall Bed: With Cue Awareness: Appears intact Executive Function: Sequencing Sequencing: Impaired Cognition Arousal/Alertness: Awake/alert Behavior During Therapy: WFL for tasks assessed/performed Overall Cognitive Status: Impaired/Different from baseline Area of Impairment: Orientation, Memory, Following commands, Problem solving Orientation Level: Disoriented to, Time Memory: Decreased short-term memory Following Commands: Follows one step commands with increased time Problem Solving: Slow processing, Requires verbal cues, Requires tactile cues, Decreased initiation  Blood pressure (!) 152/80, pulse 87, temperature 97.8 F (36.6 C), temperature source Oral, resp. rate 17, height 5\' 7"  (1.702 m), weight 74.4 kg, SpO2 98 %. Physical Exam  General:  No apparent distress, pleasant HEENT: Head is normocephalic, atraumatic, PERRLA, EOMI, sclera anicteric, oral mucosa pink and moist, dentition intact, ext ear canals  clear,  Neck: Supple without JVD or lymphadenopathy Heart: Reg rate and rhythm. No murmurs rubs or gallops Chest: CTA bilaterally without wheezes, rales, or rhonchi; no distress Abdomen: Soft, non-tender, non-distended, bowel sounds positive. Extremities: No clubbing, cyanosis, or edema. Pulses are 2+ Skin: Clean and intact without signs of breakdown Neuro: Alert and oriented x1 (person). Mild expressive aphasia and dysarthria. Requires cues for immediate recall 3/3. Cranial nerves 2-12 are intact except for R facial droop. Sensory exam is normal. Reflexes are 2+ in all 4's. Fine motor coordination is intact. No tremors. Motor function is grossly 5/5 except for 4/5 RLE.   Psych: Pt's affect is appropriate. Pt is cooperative  Results for orders placed or performed during the hospital encounter of 06/24/20 (from the past 24 hour(s))  Troponin I (High Sensitivity)     Status: None   Collection Time: 06/24/20  2:11 PM  Result Value Ref Range   Troponin I (High Sensitivity) 16 <18 ng/L  Protime-INR     Status: None   Collection Time: 06/24/20  4:36 PM  Result Value Ref Range   Prothrombin Time 13.7 11.4 - 15.2 seconds   INR 1.1 0.8 - 1.2  APTT     Status: None   Collection Time: 06/24/20  4:36 PM  Result Value Ref Range   aPTT 28 24 - 36 seconds  Resp Panel by RT-PCR (Flu A&B, Covid) Nasopharyngeal Swab     Status: None   Collection Time: 06/24/20  4:37 PM   Specimen: Nasopharyngeal Swab; Nasopharyngeal(NP) swabs in vial transport medium  Result Value Ref Range   SARS Coronavirus 2 by RT PCR NEGATIVE NEGATIVE  Influenza A by PCR NEGATIVE NEGATIVE   Influenza B by PCR NEGATIVE NEGATIVE  Hemoglobin A1c     Status: None   Collection Time: 06/25/20  4:41 AM  Result Value Ref Range   Hgb A1c MFr Bld 4.8 4.8 - 5.6 %   Mean Plasma Glucose 91.06 mg/dL  Lipid panel     Status: None   Collection Time: 06/25/20  4:41 AM  Result Value Ref Range   Cholesterol 96 0 - 200 mg/dL   Triglycerides  87 <150 mg/dL   HDL 47 >40 mg/dL   Total CHOL/HDL Ratio 2.0 RATIO   VLDL 17 0 - 40 mg/dL   LDL Cholesterol 32 0 - 99 mg/dL   CT Head Wo Contrast  Result Date: 06/24/2020 CLINICAL DATA:  Right leg pain EXAM: CT HEAD WITHOUT CONTRAST TECHNIQUE: Contiguous axial images were obtained from the base of the skull through the vertex without intravenous contrast. COMPARISON:  None. FINDINGS: Brain: There is no acute intracranial hemorrhage, mass effect, or edema. No definite acute appearing loss of gray-white differentiation. There are multiple chronic infarcts including involvement of the left frontal lobe, right parietotemporal lobe, and bilateral cerebellar hemispheres. Age-indeterminate though probably chronic infarct of the left thalamus. Additional patchy and confluent areas of hypoattenuation in the supratentorial white matter are nonspecific but probably reflect moderate chronic microvascular ischemic changes. There is no hydrocephalus.  No extra-axial collection. Vascular: There is atherosclerotic calcification at the skull base. Skull: Calvarium is unremarkable. Sinuses/Orbits: No acute finding. Other: None. IMPRESSION: No acute intracranial hemorrhage or mass effect. Multiple chronic infarcts and moderate chronic microvascular ischemic changes. Age indeterminate though probably chronic infarct of the left thalamus. Electronically Signed   By: Macy Mis M.D.   On: 06/24/2020 13:29   MR BRAIN WO CONTRAST  Result Date: 06/24/2020 CLINICAL DATA:  Right leg pain, cannot bear weight EXAM: MRI HEAD WITHOUT CONTRAST TECHNIQUE: Multiplanar, multiecho pulse sequences of the brain and surrounding structures were obtained without intravenous contrast. COMPARISON:  None. FINDINGS: Brain: There is a small acute infarct of the parasagittal left midbrain. Discontiguous small acute infarct of the left cerebral peduncle. Scattered chronic infarcts including involvement of the left frontal and parietal lobes,  right parietotemporal lobe, bilateral thalamus, and bilateral cerebellum. There is minimal susceptibility associated with the right parietotemporal infarct likely reflecting chronic blood products. Additional patchy and confluent areas of T2 hyperintensity in the supratentorial white matter are nonspecific but probably reflect moderate chronic microvascular ischemic changes. There is no intracranial mass or mass effect. There is no hydrocephalus or extra-axial fluid collection. Vascular: Major vessel flow voids at the skull base are preserved. Skull and upper cervical spine: Normal marrow signal is preserved. Sinuses/Orbits: Paranasal sinuses are aerated. Orbits are unremarkable. Other: Sella is unremarkable.  Mastoid air cells are clear. IMPRESSION: Small acute infarcts of the left midbrain. Several chronic infarcts as described. Moderate chronic microvascular ischemic changes. Electronically Signed   By: Macy Mis M.D.   On: 06/24/2020 16:19   [place] Carotid Bilateral (at [facility] and [facility] only)  Result Date: 06/25/2020 CLINICAL DATA:  74 year old male with stroke EXAM: BILATERAL CAROTID DUPLEX ULTRASOUND TECHNIQUE: Pearline Cables scale imaging, color Doppler and duplex ultrasound were performed of bilateral carotid and vertebral arteries in the neck. COMPARISON:  None. FINDINGS: Criteria: Quantification of carotid stenosis is based on velocity parameters that correlate the residual internal carotid diameter with NASCET-based stenosis levels, using the diameter of the distal internal carotid lumen as the denominator for stenosis measurement. The following  velocity measurements were obtained: RIGHT ICA:  Systolic 72 cm/sec, Diastolic 17 cm/sec CCA:  90 cm/sec SYSTOLIC ICA/CCA RATIO:  0.8 ECA:  62 cm/sec LEFT ICA:  Systolic 644 cm/sec, Diastolic 38 cm/sec CCA:  72 cm/sec SYSTOLIC ICA/CCA RATIO:  3.1 ECA:  177 cm/sec Right Brachial SBP: Not acquired Left Brachial SBP: Not acquired RIGHT CAROTID ARTERY: Calcification of the  right common carotid artery. Intermediate waveform maintained. Moderate heterogeneous and partially calcified plaque at the right carotid bifurcation. No significant lumen shadowing. Low resistance waveform of the right ICA. Tortuosity RIGHT VERTEBRAL ARTERY: Antegrade flow with low resistance waveform. LEFT CAROTID ARTERY: Calcification of the left common carotid artery. Intermediate waveform maintained. Moderate heterogeneous and partially calcified plaque at the left carotid bifurcation. Calcifications present, contributing to shadowing. Low resistance waveform of the left ICA. Tortuosity LEFT VERTEBRAL ARTERY:  Antegrade flow with low resistance waveform. IMPRESSION: Right: Color duplex indicates moderate heterogeneous and calcified plaque, with no hemodynamically significant stenosis by duplex criteria in the extracranial cerebrovascular circulation. Left: Heterogeneous and partially calcified plaque at the left carotid bifurcation, with discordant results regarding degree of stenosis by established duplex criteria. Peak velocity suggests 70% - 99% stenosis, with the ICA/ CCA ratio suggesting a lesser degree of stenosis. If establishing a more accurate degree of stenosis is required, cerebral angiogram should be considered, or as a second best test, CTA. Signed, Dulcy Fanny. Dellia Nims, RPVI [facility] Radiology Electronically Signed   By: [name] D.O.   On: 06/25/2020 07:45   ECHOCARDIOGRAM COMPLETE  Result Date: 06/25/2020    ECHOCARDIOGRAM REPORT   Patient Name:   CARLISLE ENKE Date of Exam: 06/25/2020 Medical Rec #:  [id number]      Height:       67.0 in Accession #:    [id number]     Weight:       164.0 lb Date of Birth:  12-May-1946      BSA:          1.859 m Patient Age:    74 years       BP:           158/79 mmHg Patient Gender: M              HR:           85 bpm. Exam Location:  [facility] Procedure: 2D Echo, Cardiac Doppler and Color Doppler  Indications:     Stroke 434.91  History:         Patient has no prior history of Echocardiogram examinations.                  Signs/Symptoms:Syncope; Risk Factors:Hypertension.  Sonographer:     Sherrie Sport RDCS (AE) Referring Phys:  [id number] [facility] AMIN Diagnosing Phys: Neoma Laming MD  Sonographer Comments: Technically difficult study due to poor echo windows, no subcostal window and no apical window. IMPRESSIONS  1. Left ventricular ejection fraction, by estimation, is 60 to 65%. The left ventricle has normal function. The left ventricle has no regional wall motion abnormalities. There is severe concentric left ventricular hypertrophy. Left ventricular diastolic  parameters are consistent with Grade I diastolic dysfunction (impaired relaxation).  2. Right ventricular systolic function is normal. The right ventricular size is normal.  3. The mitral valve is normal in structure. Mild mitral valve regurgitation. No evidence of mitral stenosis.  4. The aortic valve is normal in structure. Aortic valve regurgitation is not visualized. No aortic stenosis  is present.  5. The inferior vena cava is normal in size with greater than 50% respiratory variability, suggesting right atrial pressure of 3 mmHg. FINDINGS  Left Ventricle: Left ventricular ejection fraction, by estimation, is 60 to 65%. The left ventricle has normal function. The left ventricle has no regional wall motion abnormalities. The left ventricular internal cavity size was normal in size. There is  severe concentric left ventricular hypertrophy. Left ventricular diastolic parameters are consistent with Grade I diastolic dysfunction (impaired relaxation). Right Ventricle: The right ventricular size is normal. No increase in right ventricular wall thickness. Right ventricular systolic function is normal. Left Atrium: Left atrial size was normal in size. Right Atrium: Right atrial size was normal in size. Pericardium: There is no evidence of pericardial  effusion. Mitral Valve: The mitral valve is normal in structure. Mild mitral valve regurgitation. No evidence of mitral valve stenosis. Tricuspid Valve: The tricuspid valve is normal in structure. Tricuspid valve regurgitation is mild . No evidence of tricuspid stenosis. Aortic Valve: The aortic valve is normal in structure. Aortic valve regurgitation is not visualized. No aortic stenosis is present. Pulmonic Valve: The pulmonic valve was normal in structure. Pulmonic valve regurgitation is not visualized. No evidence of pulmonic stenosis. Aorta: The aortic root is normal in size and structure. Venous: The inferior vena cava is normal in size with greater than 50% respiratory variability, suggesting right atrial pressure of 3 mmHg. IAS/Shunts: No atrial level shunt detected by color flow Doppler.  LEFT VENTRICLE PLAX 2D LVIDd:         3.79 cm LVIDs:         2.34 cm LV PW:         1.13 cm LV IVS:        1.34 cm LVOT diam:     2.00 cm LVOT Area:     3.14 cm  LEFT ATRIUM         Index LA diam:    4.40 cm 2.37 cm/m                        PULMONIC VALVE AORTA                 PV Vmax:        0.44 m/s Ao Root diam: 3.10 cm PV Peak grad:   0.8 mmHg                       RVOT Peak grad: 1 mmHg   SHUNTS Systemic Diam: 2.00 cm Neoma Laming MD Electronically signed by Neoma Laming MD Signature Date/Time: 06/25/2020/11:58:34 AM    Final     Assessment/Plan: Diagnosis: Small acute infarct in left midbrain 1. Does the need for close, 24 hr/day medical supervision in concert with the patient's rehab needs make it unreasonable for this patient to be served in a less intensive setting? Yes 2. Co-Morbidities requiring supervision/potential complications: several chronic infarcts including involvement of the left frontal and parietal lobes, right parietotemporal lobe, bilateral thalamus, and bilateral cerebellum; impaired right sided strength and coordination, disorientation; decreased short term memory; overweight (BMI 25.69),  expressive aphasia 3. Due to bladder management, bowel management, safety, skin/wound care, disease management, medication administration, pain management and patient education, does the patient require 24 hr/day rehab nursing? Yes 4. Does the patient require coordinated care of a physician, rehab nurse, therapy disciplines of PT, OT, SLP to address physical and functional deficits in the context of the  above medical diagnosis(es)? Yes Addressing deficits in the following areas: balance, endurance, locomotion, strength, transferring, bowel/bladder control, bathing, dressing, feeding, grooming, toileting, cognition, speech, language and psychosocial support 5. Can the patient actively participate in an intensive therapy program of at least 3 hrs of therapy per day at least 5 days per week? Yes 6. The potential for patient to make measurable gains while on inpatient rehab is excellent 7. Anticipated functional outcomes upon discharge from inpatient rehab are S with PT, supervision with OT, supervision with SLP. 8. Estimated rehab length of stay to reach the above functional goals is: 10-14 days 9. Anticipated discharge destination: Home 10. Overall Rehab/Functional Prognosis: excellent  RECOMMENDATIONS: This patient's condition is appropriate for continued rehabilitative care in the following setting: CIR Patient has agreed to participate in recommended program. Yes Note that insurance prior authorization may be required for reimbursement for recommended care.  Comment: Mr. Chestnut would be an excellent CIR candidate if he will have 24/7 supervision upon discharge and if his daughter is agreeable to the above length of stay.   Izora Ribas, MD 06/25/2020

## 2020-06-25 NOTE — PMR Pre-admission (Signed)
PMR Admission Coordinator Pre-Admission Assessment  Patient: Andres White is an 74 y.o., male MRN: [id number] DOB: 1945/08/15 Height: 5' 7"  (170.2 cm) Weight: 74.4 kg              Insurance Information HMO: yes    PPO:      PCP:      IPA:      80/20:      OTHER:  PRIMARY: [employer] Medicare      Policy#: [id number]      Subscriber: patient CM Name: Wilburn Cornelia      Phone#: [phone]     Fax#: [phone] Pre-Cert#: [id number]      Employer: Josem [employer]auf[employer] provided by Wilburn Cornelia with Bernadene Bell for admit to CIR. Quentin Mulling [id number] with Nav[employer] # [id number]. Pt is approved for 7 days with start date 11/24-11/30. Clinical updates due to (f): [phone]  Benefits:  Phone #: online     Name: [email or web address] Eff. Date: 08/05/2019     Deduct: $0 (does not have deductible)      Out of Pocket Max: $3,600 ($0 met)      Life Max:   CIR: $295/day co-pay for days 1-5, $0/day co-pay for days 6+      SNF: $0/day co-pay for days 1-20, $184/day co-pay for days 21-40, $0/day co-pay for days 41-100; limited to 100 days/cal yr Outpatient: $30/visit co-pay; limited by medical necessity Home Health: 100% coverage, 0% co-insurance; limited by medical necessity      DME: 80% coverage, 20% co-insurance     Providers:  SECONDARY: None      Policy#:       Phone#:   Development worker, community:       Phone#:   [employer] Engineer, [employer]" for patients in Inpatient Rehabilitation Facilities with attached "Privacy Act [place] Records" was provided and verbally reviewed with: Patient and Family  Emergency Contact Information Contact Information    Name Relation Home Work Mobile   [name] Daughter [phone]  [phone]   Lelon Mast [phone]  [phone]     Current Medical History  Patient Admitting Diagnosis: 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 male with PMH of HTN, arthritis, tobacco use, who presented with right leg weakness, L  facial weakness, and speech changes. The patient was admitted to [facility] on 112121 with complaints of weakness in his RLE that had been ongoing for approximately 4 days; a family friend also reports he has had a change in his speech. 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 Physical Medicine & Rehabilitation was consulted to assess candidacy for CIR. Recommendation is for CIR at this time and pt is to admit to CIR on 06/27/20.   Complete NIHSS TOTAL: 2    Past Medical History  Past Medical History:  Diagnosis Date  . Arthritis   . Chronic back pain   . Gout   . Hypertension   . Syncope and collapse     Family History  family history includes Hypertension in his mother.  Prior Rehab/Hospitalizations:  Has the patient had prior rehab or hospitalizations prior to admission? No  Has the patient had major surgery during 100 days prior to admission? No  Current Medications   Current Facility-Administered Medications:  .  acetaminophen (TYLENOL) tablet 650 mg, 650 mg, Oral, Q4H PRN **  OR** acetaminophen (TYLENOL) 160 MG/5ML solution 650 mg, 650 mg, Per Tube, Q4H PRN **OR** acetaminophen (TYLENOL) suppository 650 mg, 650 mg, Rectal, Q4H PRN, Cruzita Lederer, Costin M, MD .  amLODipine (NORVASC) tablet 10 mg, 10 mg, Oral, Daily, Lorella Nimrod, MD, 10 mg at 06/26/20 0736 .  aspirin EC tablet 81 mg, 81 mg, Oral, Daily, Caren Griffins, MD, 81 mg at 06/27/20 0735 .  atorvastatin (LIPITOR) tablet 40 mg, 40 mg, Oral, Daily, Lorella Nimrod, MD, 40 mg at 06/27/20 0735 .  citalopram (CELEXA) tablet 20 mg, 20 mg, Oral, Daily, Caren Griffins, MD, 20 mg at 06/27/20 0735 .  enoxaparin (LOVENOX) injection 40 mg, 40 mg, Subcutaneous, Q24H, Caren Griffins, MD, 40 mg at 06/26/20 2109 .  hydrALAZINE (APRESOLINE) injection 10 mg, 10 mg,  Intravenous, Q6H PRN, Sharion Settler, NP, 10 mg at 06/25/20 1856 .  lisinopril (ZESTRIL) tablet 40 mg, 40 mg, Oral, Daily, Lorella Nimrod, MD, 40 mg at 06/26/20 0737 .  senna-docusate (Senokot-S) tablet 1 tablet, 1 tablet, Oral, QHS PRN, Caren Griffins, MD .  tamsulosin (FLOMAX) capsule 0.4 mg, 0.4 mg, Oral, Daily, Lorella Nimrod, MD, 0.4 mg at 06/27/20 0735  Patients Current Diet:  Diet Order            Diet heart healthy/carb modified Room service appropriate? Yes; Fluid consistency: Thin  Diet effective now                 Precautions / Restrictions Precautions Precautions: Fall Precaution Comments: watch HR Restrictions Weight Bearing Restrictions: No   Has the patient had 2 or more falls or a fall with injury in the past year?Yes  Prior Activity Level Community (5-7x/wk): active, retired, cut out driving recently. Independent without AD PTA  Prior Functional Level Prior Function Level of Independence: Independent  Self Care: Did the patient need help bathing, dressing, using the toilet or eating?  Independent  Indoor Mobility: Did the patient need assistance with walking from room to room (with or without device)? Independent  Stairs: Did the patient need assistance with internal or external stairs (with or without device)? Independent  Functional Cognition: Did the patient need help planning regular tasks such as shopping or remembering to take medications? Independent  Home Assistive Devices / Equipment Home Assistive Devices/Equipment: None Home Equipment: Cane - single point  Prior Device Use: Indicate devices/aids used by the patient prior to current illness, exacerbation or injury? None of the above  Current Functional Level Cognition  Arousal/Alertness: Awake/alert Overall Cognitive Status: Impaired/Different from baseline Orientation Level: Oriented to person, Oriented to place, Oriented to situation, Disoriented to time Following Commands: Follows  one step commands consistently, Follows multi-step commands inconsistently Safety/Judgement: Decreased awareness of safety, Decreased awareness of deficits Attention: Focused Focused Attention: Appears intact Memory: Impaired Memory Impairment: Decreased recall of new information, Decreased short term memory Awareness: Appears intact Executive Function: Sequencing Sequencing: Impaired    Extremity Assessment (includes Sensation/Coordination)  Upper Extremity Assessment: RUE deficits/detail, LUE deficits/detail RUE Deficits / Details: R shoulder strength grossly 4/5, elbow and grip strength symmetrical to L RUE Sensation: WNL RUE Coordination: decreased fine motor, decreased gross motor LUE Deficits / Details: WFLs LUE Sensation: WNL LUE Coordination: WNL  Lower Extremity Assessment: Defer to PT evaluation, RLE deficits/detail, LLE deficits/detail RLE Deficits / Details: hip flexion 4+/5, hip abduction/adduction 4+5, knee extension 4+/5 just extended time needed for TKE. ankle DF 2+/5, PF 3+/5 RLE Sensation: WNL RLE Coordination: decreased gross motor, decreased fine motor LLE Deficits /  Details: WFLs LLE Sensation: WNL LLE Coordination: WNL    ADLs  Overall ADL's : Needs assistance/impaired General ADL Comments: MIN/MOD A with seated LB dressing, increased RR/decreased O2 with attempts to fold at the waist in sitting for LB ADLs. SETUP for seated UB ADLs d/t decreased R UE coordination. Requires MIN A/CGA for ADL transfers/fxl mobility with RW as he needs B UE support.    Mobility  Overal bed mobility: Needs Assistance Bed Mobility: Supine to Sit Supine to sit: Supervision General bed mobility comments: pt up to chair with PT when OT presents.    Transfers  Overall transfer level: Needs assistance Equipment used: Rolling walker (2 wheeled) Transfers: Sit to/from Stand Sit to Stand: Min assist, Min guard General transfer comment: Verbal cues for hand placement and immediate  standing balance intermittently.    Ambulation / Gait / Stairs / Wheelchair Mobility  Ambulation/Gait Ambulation/Gait assistance: Herbalist (Feet): 60 Feet (60x2.) Assistive device: Rolling walker (2 wheeled) (use of railing in hallway on L) Gait Pattern/deviations: Decreased dorsiflexion - right General Gait Details: Pt presents with narrow BOS. Use of Acewrap for dorsiflexion assist. PT presenting with difficulty to slow or stop gait. Gait velocity: decreased Gait velocity interpretation: <1.31 ft/sec, indicative of household ambulator    Posture / Balance Balance Overall balance assessment: Needs assistance Sitting-balance support: Feet supported Sitting balance-Leahy Scale: Good Standing balance support: Bilateral upper extremity supported Standing balance-Leahy Scale: Fair Standing balance comment: initially attempted with cane as pt with cane at home, but pt unable to steady himself with only 1 UE support, requires B UE support with RW to successfully perform fxl mobility.    Special needs/care consideration NA     Previous Home Environment (from acute therapy documentation) Living Arrangements: Alone Available Help at Discharge: Family, Available PRN/intermittently Type of Home: House Home Layout: One level Home Access: Stairs to enter [employer] of Steps: 2 Bathroom Shower/Tub: Optometrist: Yes Home Care Services: No Additional Comments: Pt reported 5 falls in the last 6 months, reported that it happens when he goes to stand up but does not feel light-headed, just unsteady  Discharge Living Setting Plans for Discharge Living Setting: Other (Comment) (will go stay with daugther at her house in [place]) Type of Home at Discharge: House Discharge Home Layout: One level Discharge Home Access: Stairs to enter Entrance Stairs-Rails: None Entrance Stairs-Number of Steps: 1 Discharge  Bathroom Shower/Tub: Tub/shower unit Discharge Bathroom Toilet: Standard Discharge Bathroom Accessibility: Yes How Accessible: Accessible via walker Does the patient have any problems obtaining your medications?: No  Social/Family/Support Systems Patient Roles: Other (Comment) (retired; 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 Information: daughter: [name]: [phone] Anticipated Caregiver: daughter Anticipated Caregiver's Contact Information: see above Ability/Limitations of Caregiver: supervision Caregiver Availability: 24/7 Discharge Plan Discussed with Primary Caregiver: Yes Is Caregiver In Agreement with Plan?: Yes Does Caregiver/Family have Issues with Lodging/Transportation while Pt is in Rehab?: No   Goals Patient/Family Goal for Rehab: PT/OT/SLP: Supervision Expected length of stay: 10-14 days Pt/Family Agrees to Admission and willing to participate: Yes Program Orientation Provided & Reviewed with Pt/Caregiver Including Roles  & Responsibilities: Yes (pt and his daughter)  Barriers to Discharge: Home environment access/layout  Barriers to Discharge Comments: one step to enter daughter's home (Immediate DC location)   Decrease burden of Care through IP rehab admission: OtherNA   Possible need for SNF placement upon discharge:Not anticipated; this patient has good support from his daughter who can provide 24/7 Supervision  at DC. They are aware that the plan is to DC home from CIR.    Patient Condition: This patient's condition remains as documented in the consult dated 06/25/20, in which the Rehabilitation Physician determined and documented that the patient's condition is appropriate for intensive rehabilitative care in an inpatient rehabilitation facility. Will admit to inpatient rehab today, 06/27/20.  Preadmission Screen Completed By:  Raechel Ache, OT, 06/27/2020 11:04 AM ______________________________________________________________________   Discussed status with Dr. Ranell Patrick  on 11/24/21at 11:04AM and received approval for admission today.  Admission Coordinator:  Raechel Ache, time 11:04AM/Date 06/27/20

## 2020-06-25 NOTE — Consult Note (Signed)
Reason for Consult: R sided weakness  Requesting Physician: Dr. Reesa Chew   CC: R sided weakness   HPI: Andres White is an 74 y.o. malewith medical history significant of hypertension, arthritis, tobacco use, comes to the hospital with complaints of right leg weakness.  Patient has been weak in his right leg since Wednesday, for about 4 days.   He is still smoking, and drinks 1 can of beer per day.  He denies any abdominal pain, nausea or vomiting.  No fever or chills. MRI with L Midbrain stroke. No prior anti platelet therapy  Past Medical History:  Diagnosis Date  . Arthritis   . Chronic back pain   . Gout   . Hypertension   . Syncope and collapse     Past Surgical History:  Procedure Laterality Date  . CARDIAC CATHETERIZATION  2015   [facility]   . KNEE SURGERY      Family History  Problem Relation Age of Onset  . Hypertension Mother     Social History:  reports that he has been smoking cigarettes. He has smoked for the past 20.00 years. He has never used smokeless tobacco. He reports current alcohol use. He reports that he does not use drugs.  No Known Allergies  Medications: I have reviewed the patient's current medications.  ROS: History obtained from the patient  General ROS: negative for - chills, fatigue, fever, night sweats, weight gain or weight loss Psychological ROS: negative for - behavioral disorder, hallucinations, memory difficulties, mood swings or suicidal ideation Ophthalmic ROS: negative for - blurry vision, double vision, eye pain or loss of vision ENT ROS: negative for - epistaxis, nasal discharge, oral lesions, sore throat, tinnitus or vertigo Allergy and Immunology ROS: negative for - hives or itchy/watery eyes Hematological and Lymphatic ROS: negative for - bleeding problems, bruising or swollen lymph nodes Endocrine ROS: negative for - galactorrhea, hair pattern changes, polydipsia/polyuria or temperature intolerance Respiratory ROS: negative for - cough,  hemoptysis, shortness of breath or wheezing Cardiovascular ROS: negative for - chest pain, dyspnea on exertion, edema or irregular heartbeat Gastrointestinal ROS: negative for - abdominal pain, diarrhea, hematemesis, nausea/vomiting or stool incontinence Genito-Urinary ROS: negative for - dysuria, hematuria, incontinence or urinary frequency/urgency Musculoskeletal ROS: negative for - joint swelling or muscular weakness Neurological ROS: as noted in HPI Dermatological ROS: negative for rash and skin lesion changes  Physical Examination: Blood pressure (!) 158/79, pulse 85, temperature 97.6 F (36.4 C), temperature source Oral, resp. rate 16, height 5\' 7"  (1.702 m), weight 74.4 kg, SpO2 98 %.   Neurological Examination   Alert, oriented, thought content appropriate.  Dysarthria Cranial Nerves: II: Discs flat bilaterally; Visual fields grossly normal, pupils equal, round, reactive to light and accommodation III,IV, VI: ptosis not present, extra-ocular motions intact bilaterally V,VII: R facial droop  VIII: hearing normal bilaterally IX,X: gag reflex present XI: bilateral shoulder shrug XII: midline tongue extension Motor: Right : Upper extremity   5/5    Left:     Upper extremity   5/5  Lower extremity   4+/5     Lower extremity   5/5 Tone and bulk:normal tone throughout; no atrophy noted Sensory: Pinprick and light touch intact throughout, bilaterally  Laboratory Studies:   Basic Metabolic Panel: Recent Labs  Lab 06/24/20 1201  NA 142  K 3.6  CL 109  CO2 22  GLUCOSE 98  BUN 16  CREATININE 1.13  CALCIUM 9.7    Liver Function Tests: Recent Labs  Lab 06/24/20  1201  AST 50*  ALT 37  ALKPHOS 78  BILITOT 1.0  PROT 8.5*  ALBUMIN 4.2   No results for input(s): LIPASE, AMYLASE in the last 168 hours. No results for input(s): AMMONIA in the last 168 hours.  CBC: Recent Labs  Lab 06/24/20 1201  WBC 7.8  HGB 14.2  HCT 44.1  MCV 94.8  PLT 176    Cardiac  Enzymes: No results for input(s): CKTOTAL, CKMB, CKMBINDEX, TROPONINI in the last 168 hours.  BNP: Invalid input(s): POCBNP  CBG: No results for input(s): GLUCAP in the last 168 hours.  Microbiology: Results for orders placed or performed during the hospital encounter of 06/24/20  Resp Panel by RT-PCR (Flu A&B, Covid) Nasopharyngeal Swab     Status: None   Collection Time: 06/24/20  4:37 PM   Specimen: Nasopharyngeal Swab; Nasopharyngeal(NP) swabs in vial transport medium  Result Value Ref Range Status   SARS Coronavirus 2 by RT PCR NEGATIVE NEGATIVE Final    Comment: (NOTE) SARS-CoV-2 target nucleic acids are NOT DETECTED.  The SARS-CoV-2 RNA is generally detectable in upper respiratory specimens during the acute phase of infection. The lowest concentration of SARS-CoV-2 viral copies this assay can detect is 138 copies/mL. A negative result does not preclude SARS-Cov-2 infection and should not be used as the sole basis for treatment or other patient management decisions. A negative result may occur with  improper specimen collection/handling, submission of specimen other than nasopharyngeal swab, presence of viral mutation(s) within the areas targeted by this assay, and inadequate number of viral copies(<138 copies/mL). A negative result must be combined with clinical observations, patient history, and epidemiological information. The expected result is Negative.  Fact Sheet for Patients:  EntrepreneurPulse.com.au  Fact Sheet for Healthcare Providers:  IncredibleEmployment.be  This test is no t yet approved or cleared by the [place] FDA and  has been authorized for detection and/or diagnosis of SARS-CoV-2 by FDA under an Emergency Use Authorization (EUA). This EUA will remain  in effect (meaning this test can be used) for the duration of the COVID-19 declaration under Section 564(b)(1) of the Act, 21 U.S.C.section 360bbb-3(b)(1),  unless the authorization is terminated  or revoked sooner.       Influenza A by PCR NEGATIVE NEGATIVE Final   Influenza B by PCR NEGATIVE NEGATIVE Final    Comment: (NOTE) The Xpert Xpress SARS-CoV-2/FLU/RSV plus assay is intended as an aid in the diagnosis of influenza from Nasopharyngeal swab specimens and should not be used as a sole basis for treatment. Nasal washings and aspirates are unacceptable for Xpert Xpress SARS-CoV-2/FLU/RSV testing.  Fact Sheet for Patients: EntrepreneurPulse.com.au  Fact Sheet for Healthcare Providers: IncredibleEmployment.be  This test is not yet approved or cleared by the [place] FDA and has been authorized for detection and/or diagnosis of SARS-CoV-2 by FDA under an Emergency Use Authorization (EUA). This EUA will remain in effect (meaning this test can be used) for the duration of the COVID-19 declaration under Section 564(b)(1) of the Act, 21 U.S.C. section 360bbb-3(b)(1), unless the authorization is terminated or revoked.  Performed at [facility], [address] [place], [place] [address]     Coagulation Studies: Recent Labs    06/24/20 1636  LABPROT 13.7  INR 1.1    Urinalysis:  Recent Labs  Lab 06/24/20 0732  COLORURINE YELLOW*  LABSPEC 1.014  PHURINE 5.0  GLUCOSEU NEGATIVE  HGBUR NEGATIVE  BILIRUBINUR NEGATIVE  KETONESUR 20*  PROTEINUR NEGATIVE  NITRITE NEGATIVE  LEUKOCYTESUR MODERATE*  Lipid Panel:     Component Value Date/Time   CHOL 96 06/25/2020 0441   TRIG 87 06/25/2020 0441   HDL 47 06/25/2020 0441   CHOLHDL 2.0 06/25/2020 0441   VLDL 17 06/25/2020 0441   LDLCALC 32 06/25/2020 0441    HgbA1C:  Lab Results  Component Value Date   HGBA1C 4.8 06/25/2020    Urine Drug Screen:      Component Value Date/Time   LABOPIA NONE DETECTED 06/24/2020 0732   COCAINSCRNUR NONE DETECTED 06/24/2020 0732   LABBENZ NONE DETECTED 06/24/2020 0732   AMPHETMU  NONE DETECTED 06/24/2020 0732   THCU NONE DETECTED 06/24/2020 0732   LABBARB NONE DETECTED 06/24/2020 0732    Alcohol Level: No results for input(s): ETH in the last 168 hours.  Other results: EKG: normal EKG, normal sinus rhythm, unchanged from previous tracings.  Imaging: CT Head Wo Contrast  Result Date: 06/24/2020 CLINICAL DATA:  Right leg pain EXAM: CT HEAD WITHOUT CONTRAST TECHNIQUE: Contiguous axial images were obtained from the base of the skull through the vertex without intravenous contrast. COMPARISON:  None. FINDINGS: Brain: There is no acute intracranial hemorrhage, mass effect, or edema. No definite acute appearing loss of gray-white differentiation. There are multiple chronic infarcts including involvement of the left frontal lobe, right parietotemporal lobe, and bilateral cerebellar hemispheres. Age-indeterminate though probably chronic infarct of the left thalamus. Additional patchy and confluent areas of hypoattenuation in the supratentorial white matter are nonspecific but probably reflect moderate chronic microvascular ischemic changes. There is no hydrocephalus.  No extra-axial collection. Vascular: There is atherosclerotic calcification at the skull base. Skull: Calvarium is unremarkable. Sinuses/Orbits: No acute finding. Other: None. IMPRESSION: No acute intracranial hemorrhage or mass effect. Multiple chronic infarcts and moderate chronic microvascular ischemic changes. Age indeterminate though probably chronic infarct of the left thalamus. Electronically Signed   By: Macy Mis M.D.   On: 06/24/2020 13:29   MR BRAIN WO CONTRAST  Result Date: 06/24/2020 CLINICAL DATA:  Right leg pain, cannot bear weight EXAM: MRI HEAD WITHOUT CONTRAST TECHNIQUE: Multiplanar, multiecho pulse sequences of the brain and surrounding structures were obtained without intravenous contrast. COMPARISON:  None. FINDINGS: Brain: There is a small acute infarct of the parasagittal left midbrain.  Discontiguous small acute infarct of the left cerebral peduncle. Scattered chronic infarcts including involvement of the left frontal and parietal lobes, right parietotemporal lobe, bilateral thalamus, and bilateral cerebellum. There is minimal susceptibility associated with the right parietotemporal infarct likely reflecting chronic blood products. Additional patchy and confluent areas of T2 hyperintensity in the supratentorial white matter are nonspecific but probably reflect moderate chronic microvascular ischemic changes. There is no intracranial mass or mass effect. There is no hydrocephalus or extra-axial fluid collection. Vascular: Major vessel flow voids at the skull base are preserved. Skull and upper cervical spine: Normal marrow signal is preserved. Sinuses/Orbits: Paranasal sinuses are aerated. Orbits are unremarkable. Other: Sella is unremarkable.  Mastoid air cells are clear. IMPRESSION: Small acute infarcts of the left midbrain. Several chronic infarcts as described. Moderate chronic microvascular ischemic changes. Electronically Signed   By: Macy Mis M.D.   On: 06/24/2020 16:19   US Carotid Bilateral (at [facility] and [facility] only)  Result Date: 06/25/2020 CLINICAL DATA:  74 year old male with stroke EXAM: BILATERAL CAROTID DUPLEX ULTRASOUND TECHNIQUE: Pearline Cables scale imaging, color Doppler and duplex ultrasound were performed of bilateral carotid and vertebral arteries in the neck. COMPARISON:  None. FINDINGS: Criteria: Quantification of carotid stenosis is based on velocity parameters that correlate the  residual internal carotid diameter with NASCET-based stenosis levels, using the diameter of the distal internal carotid lumen as the denominator for stenosis measurement. The following velocity measurements were obtained: RIGHT ICA:  Systolic 72 cm/sec, Diastolic 17 cm/sec CCA:  90 cm/sec SYSTOLIC ICA/CCA RATIO:  0.8 ECA:  62 cm/sec LEFT ICA:  Systolic 407 cm/sec, Diastolic 38 cm/sec CCA:  72 cm/sec  SYSTOLIC ICA/CCA RATIO:  3.1 ECA:  177 cm/sec Right Brachial SBP: Not acquired Left Brachial SBP: Not acquired RIGHT CAROTID ARTERY: Calcification of the right common carotid artery. Intermediate waveform maintained. Moderate heterogeneous and partially calcified plaque at the right carotid bifurcation. No significant lumen shadowing. Low resistance waveform of the right ICA. Tortuosity RIGHT VERTEBRAL ARTERY: Antegrade flow with low resistance waveform. LEFT CAROTID ARTERY: Calcification of the left common carotid artery. Intermediate waveform maintained. Moderate heterogeneous and partially calcified plaque at the left carotid bifurcation. Calcifications present, contributing to shadowing. Low resistance waveform of the left ICA. Tortuosity LEFT VERTEBRAL ARTERY:  Antegrade flow with low resistance waveform. IMPRESSION: Right: Color duplex indicates moderate heterogeneous and calcified plaque, with no hemodynamically significant stenosis by duplex criteria in the extracranial cerebrovascular circulation. Left: Heterogeneous and partially calcified plaque at the left carotid bifurcation, with discordant results regarding degree of stenosis by established duplex criteria. Peak velocity suggests 70% - 99% stenosis, with the ICA/ CCA ratio suggesting a lesser degree of stenosis. If establishing a more accurate degree of stenosis is required, cerebral angiogram should be considered, or as a second best test, CTA. Signed, Dulcy Fanny. Dellia Nims, RPVI [facility] Electronically Signed   By: [name] D.O.   On: 06/25/2020 07:45     Assessment/Plan:   74 y.o. malewith medical history significant of hypertension, arthritis, tobacco use, comes to the hospital with complaints of right leg weakness.  Patient has been weak in his right leg since Wednesday, for about 4 days.   He is still smoking, and drinks 1 can of beer per day.  He denies any abdominal pain,  nausea or vomiting.  No fever or chills. MRI with L Midbrain stroke. No prior anti platelet therapy  - L midbrain stroke - discussed with family at bedside - strength improved overnight - ASA/statin daily - as per daughter she would like him to be in prolonged rehab about 30 days. States that would be up to PT/OT - No further imaging from neurological perspective. Call with questions 06/25/2020, 11:17 AM

## 2020-06-25 NOTE — Telephone Encounter (Signed)
Secure chat received from Dr. Fletcher Anon.   please arrange for this patient to have a 1 week outpatient ZIO XT monitor and follow-up with [place] after. It is for nonsustained ventricular tachycardia.  Patient to be discharged on 06/26/20 or 06/27/20. Patient will be d/c to a snf CIR.  He will also need a 4 wk f/u after the monitor.

## 2020-06-25 NOTE — Evaluation (Addendum)
Physical Therapy Evaluation Patient Details Name: Andres White MRN: [id number] DOB: November 11, 1945 Today's Date: 06/25/2020   History of Present Illness  Pt is a 74 y.o. male with medical history significant of hypertension, arthritis, tobacco use, comes to the hospital with complaints of right leg weakness, L facial weakness noted and roommate mentioned changes in speech.[name] MRI+ for small acute infarcts in the left midbrain as well as several chronic infarcts including involvement of the left frontal and parietal lobes, right parietotemporal lobe, bilateral thalamus, and bilateral cerebellum.    Clinical Impression  Pt alert, in bed oriented to self and place, unclear true orientation to situation but behavior WFLs. Disoriented to time, and exhibited decreased recall/short term memory throughout session. Pt did seem to have some expressive difficulties as well. Family in room to assist with PLOF as needed but pt able to predominantly provide accurate information. He was independent at baseline for ADLs/IADLs, 5 falls in the last 6 months.  Upon assessment the patient demonstrated decreased R shoulder strength and hand coordination, as well as impaired RLE strength/coordination. Sensation WFLs. Pt with difficulty of L eye opening, but visual tracking/fields WFLs. Supine to sit with HOB elevated and supervision, good sitting balance noted. Pt very challenged with upright balance/steadiness. He needed cueing for hand placement for ea transfer to ensure safety, as well as bilateral UE support. With ambulation the patient exhibited difficulty with proprioception of RLE in space, narrow BOS noted with staggering/drifitng with ambulation. Pt attempted with quad cane modA for safety, significant staggering noted when attempting to terminate ambulation. He was able to ambulate ~52ft total with at least min-modA.    HR and O2 monitored throughout session, HR flucuated between 100s-high 130s (especially with  mobility) and monitor showing V-tach intermittently, RN aware. Pt asymptomatic. spO2 >90% throughout, but did exhibit SOB and tachypnea with mobilty.   Overall the patient demonstrated deficits (see "PT Problem List") that impede the patient's functional abilities, safety, and mobility and would benefit from skilled PT intervention. Recommendation is transition to acute inpatient rehab upon discharge for high-intensity, post-acute rehab services to maximize independence, mobility, and safety. Pt is highly motivated and has good family support.      Follow Up Recommendations CIR    Equipment Recommendations  Other (comment) (TBD at next venue of care)    Recommendations for Other Services OT consult     Precautions / Restrictions Precautions Precautions: Fall Precaution Comments: watch HR Restrictions Weight Bearing Restrictions: No      Mobility  Bed Mobility Overal bed mobility: Needs Assistance Bed Mobility: Supine to Sit     Supine to sit: Supervision;HOB elevated     General bed mobility comments: pt up to chair with PT when OT presents.    Transfers Overall transfer level: Needs assistance Equipment used: Rolling walker (2 wheeled);Quad cane Transfers: Sit to/from Stand Sit to Stand: Min assist;Min guard         General transfer comment: Pt with decreased steadiness requiring B UE support and MIN A/CGA to steady once in standing and with fxl mobility.  Ambulation/Gait Ambulation/Gait assistance: Mod assist;Min assist Gait Distance (Feet):  (63ft, seated rest break, additional 35ft with RW, seated rest break ~75ft with quad cane.) Assistive device: Rolling walker (2 wheeled);Quad cane Gait Pattern/deviations: Drifts right/left;Trunk flexed;Narrow base of support;Step-through pattern Gait velocity: decreased   General Gait Details: Pt exhibited difficulty with proprioception of RLE in space, narrow BOS noted with staggering/drifitng with ambulation. Pt attempted  with quad canem modA for  safety, significant staggering noted when attempting to terminate ambulation.  Stairs            Wheelchair Mobility    Modified Rankin (Stroke Patients Only)       Balance Overall balance assessment: Needs assistance Sitting-balance support: Feet supported Sitting balance-Leahy Scale: Good     Standing balance support: Single extremity supported Standing balance-Leahy Scale: Poor Standing balance comment: pt with initial standing balance deficits  but with time able to stand with quad cane. With any dynamic standing/ambulation pt needed bilateral UE support                             Pertinent Vitals/Pain Pain Assessment: No/denies pain    Home Living Family/patient expects to be discharged to:: Private residence Living Arrangements: Alone Available Help at Discharge: Family;Available PRN/intermittently Type of Home: House Home Access: Stairs to enter   [employer] of Steps: 2 Home Layout: One level Home Equipment: Cane - single point Additional Comments: Pt reported 5 falls in the last 6 months, reported that it happens when he goes to stand up but does not feel light-headed, just unsteady    Prior Function Level of Independence: Independent               Hand Dominance   Dominant Hand: Right    Extremity/Trunk Assessment   Upper Extremity Assessment Upper Extremity Assessment: RUE deficits/detail;LUE deficits/detail RUE Deficits / Details: R shoulder strength grossly 4/5, elbow and grip strength symmetrical to L RUE Sensation: WNL RUE Coordination: decreased fine motor;decreased gross motor LUE Deficits / Details: WFLs LUE Sensation: WNL LUE Coordination: WNL    Lower Extremity Assessment Lower Extremity Assessment: Defer to PT evaluation;RLE deficits/detail;LLE deficits/detail RLE Deficits / Details: hip flexion 4+/5, hip abduction/adduction 4+5, knee extension 4+/5 just extended time needed for  TKE. ankle DF 2+/5, PF 3+/5 RLE Sensation: WNL RLE Coordination: decreased gross motor;decreased fine motor LLE Deficits / Details: WFLs LLE Sensation: WNL LLE Coordination: WNL    Cervical / Trunk Assessment Cervical / Trunk Assessment:  (flexed thoracic/cervical region in standing/with mobility. Cues to extend to full stand.) Cervical / Trunk Exceptions: Pt able to stand fully upright with cueing, predominantly hunched with ambulation  Communication   Communication: Expressive difficulties  Cognition Arousal/Alertness: Awake/alert Behavior During Therapy: WFL for tasks assessed/performed Overall Cognitive Status: Impaired/Different from baseline Area of Impairment: Orientation;Memory;Following commands;Problem solving                 Orientation Level: Disoriented to;Time   Memory: Decreased short-term memory Following Commands: Follows one step commands with increased time     Problem Solving: Slow processing;Requires verbal cues;Requires tactile cues;Decreased initiation        General Comments General comments (skin integrity, edema, etc.): HR and O2 monitored throughout session, HR flucuated between 100s-high 130s (especially with mobility) and monitor showing V-tach intermittently, RN aware. Pt asymptomatic. spO2 >90% throughout, but did exhibit SOB and tachypnea with mobilty    Exercises Other Exercises Other Exercises: Pt exhibited difficulty with L eye opening, visual fields/tracking WFLs. Impaired convergence noted Other Exercises: Pt instructed in ankle DF/PF throughout his day as HEP, as well as coordinated, alternating ankle DF/PF for coordination training.   Assessment/Plan    PT Assessment Patient needs continued PT services  PT Problem List Decreased strength;Decreased mobility;Decreased safety awareness;Decreased coordination;Decreased activity tolerance;Decreased balance;Decreased knowledge of use of DME       PT Treatment Interventions DME  instruction;Therapeutic exercise;Gait training;Balance training;Stair training;Neuromuscular re-education;Functional mobility training;Patient/family education;Therapeutic activities    PT Goals (Current goals can be found in the Care Plan section)  Acute Rehab PT Goals Patient Stated Goal: to return to PLOF PT Goal Formulation: With patient Time For Goal Achievement: 07/09/20 Potential to Achieve Goals: Good    Frequency 7X/week   Barriers to discharge        Co-evaluation   Reason for Co-Treatment: Complexity of the patient's impairments (multi-system involvement);For patient/therapist safety PT goals addressed during session: Mobility/safety with mobility;Balance;Strengthening/ROM OT goals addressed during session: ADL's and self-care;Proper use of Adaptive equipment and DME       AM-PAC PT "6 Clicks" Mobility  Outcome Measure Help needed turning from your back to your side while in a flat bed without using bedrails?: None Help needed moving from lying on your back to sitting on the side of a flat bed without using bedrails?: None Help needed moving to and from a bed to a chair (including a wheelchair)?: A Little Help needed standing up from a chair using your arms (e.g., wheelchair or bedside chair)?: A Little Help needed to walk in hospital room?: A Lot Help needed climbing 3-5 steps with a railing? : Total 6 Click Score: 17    End of Session Equipment Utilized During Treatment: Gait belt Activity Tolerance: Patient tolerated treatment well Patient left: in chair;Other (comment);with family/visitor present (with OT at bedside) Nurse Communication: Mobility status PT Visit Diagnosis: Other abnormalities of gait and mobility (R26.89);Muscle weakness (generalized) (M62.81);Difficulty in walking, not elsewhere classified (R26.2);Unsteadiness on feet (R26.81);History of falling (Z91.81)    Time: 6314-9702 PT Time Calculation (min) (ACUTE ONLY): 42 min   Charges:   PT  Evaluation $PT Eval Low Complexity: 1 Low PT Treatments $Gait Training: 8-22 mins $Therapeutic Exercise: 8-22 mins       Lieutenant Diego PT, DPT 1:02 PM,06/25/20

## 2020-06-25 NOTE — Progress Notes (Signed)
Inpatient Rehabilitation-Admissions Coordinator   Spoke with patient and his daughter via phone as follow up from PM&R MD consult. Reviewed recommended rehab program, including expectations, ELOS, and anticipated support at DC. Pt and his daughter would like to pursue this program. His daughter has confirmed DC support. AC will begin insurance auth process for possible admit.   Raechel Ache, OTR/L  Rehab Admissions Coordinator  [phone] 06/25/2020 3:37 PM

## 2020-06-26 ENCOUNTER — Other Ambulatory Visit: Payer: Self-pay

## 2020-06-26 ENCOUNTER — Ambulatory Visit (INDEPENDENT_AMBULATORY_CARE_PROVIDER_SITE_OTHER): Payer: Medicare Other

## 2020-06-26 DIAGNOSIS — I471 Supraventricular tachycardia: Secondary | ICD-10-CM

## 2020-06-26 NOTE — Progress Notes (Addendum)
Physical Therapy Treatment Patient Details Name: Andres White MRN: [id number] DOB: 03-28-1946 Today's Date: 06/26/2020    History of Present Illness Pt is a 74 y.o. male with medical history significant of hypertension, arthritis, tobacco use, comes to the hospital with complaints of right leg weakness, L facial weakness noted and roommate mentioned changes in speech.[name] MRI+ for small acute infarcts in the left midbrain as well as several chronic infarcts including involvement of the left frontal and parietal lobes, right parietotemporal lobe, bilateral thalamus, and bilateral cerebellum.    PT Comments    The pt is making great progress towards his goals. He continues to present with cognitive deficits and limited short term memory. The pt demonstrates increased independence with gait with use of RW or unilateral support. With continued skilled PT the pt could progress back to PLOF of ambulation without the use of an AD. PT will continue to follow.     Follow Up Recommendations  CIR     Equipment Recommendations  Other (comment)    Recommendations for Other Services       Precautions / Restrictions Precautions Precautions: Fall Precaution Comments: watch HR Restrictions Weight Bearing Restrictions: No    Mobility  Bed Mobility Overal bed mobility: Needs Assistance Bed Mobility: Supine to Sit     Supine to sit: Supervision        Transfers Overall transfer level: Needs assistance Equipment used: Rolling walker (2 wheeled) Transfers: Sit to/from Stand Sit to Stand: Min assist;Min guard         General transfer comment: Verbal cues for hand placement and immediate standing balance intermittently.  Ambulation/Gait Ambulation/Gait assistance: Min assist Gait Distance (Feet): 60 Feet (60x2.) Assistive device: Rolling walker (2 wheeled) (use of railing in hallway on L) Gait Pattern/deviations: Decreased dorsiflexion - right Gait velocity: decreased Gait velocity  interpretation: <1.31 ft/sec, indicative of household ambulator General Gait Details: Pt presents with narrow BOS. Use of Acewrap for dorsiflexion assist. PT presenting with difficulty to slow or stop gait.   Stairs             Wheelchair Mobility    Modified Rankin (Stroke Patients Only) Modified Rankin (Stroke Patients Only) Pre-Morbid Rankin Score: Moderately severe disability     Balance Overall balance assessment: Needs assistance Sitting-balance support: Feet supported Sitting balance-Leahy Scale: Good     Standing balance support: Bilateral upper extremity supported Standing balance-Leahy Scale: Fair                              Cognition Arousal/Alertness: Awake/alert Behavior During Therapy: WFL for tasks assessed/performed Overall Cognitive Status: Impaired/Different from baseline Area of Impairment: Orientation;Safety/judgement;Problem solving;Awareness                 Orientation Level: Disoriented to;Time   Memory: Decreased short-term memory;Decreased recall of precautions Following Commands: Follows one step commands consistently;Follows multi-step commands inconsistently Safety/Judgement: Decreased awareness of safety;Decreased awareness of deficits   Problem Solving: Slow processing;Requires verbal cues;Requires tactile cues        Exercises Other Exercises Other Exercises: 5xSTS 28.6s with BUE support and close supervision for safety.    General Comments        Pertinent Vitals/Pain Pain Assessment: No/denies pain    Home Living                      Prior Function  PT Goals (current goals can now be found in the care plan section) Acute Rehab PT Goals Patient Stated Goal: to return to PLOF PT Goal Formulation: With patient Time For Goal Achievement: 07/09/20 Potential to Achieve Goals: Good Progress towards PT goals: Progressing toward goals    Frequency    7X/week      PT Plan  Current plan remains appropriate    Co-evaluation     PT goals addressed during session: Mobility/safety with mobility;Proper use of DME;Balance        AM-PAC PT "6 Clicks" Mobility   Outcome Measure  Help needed turning from your back to your side while in a flat bed without using bedrails?: None Help needed moving from lying on your back to sitting on the side of a flat bed without using bedrails?: None   Help needed standing up from a chair using your arms (e.g., wheelchair or bedside chair)?: A Little Help needed to walk in hospital room?: A Little Help needed climbing 3-5 steps with a railing? : A Lot 6 Click Score: 16    End of Session Equipment Utilized During Treatment: Gait belt Activity Tolerance: Patient tolerated treatment well Patient left: in chair;Other (comment);with family/visitor present Nurse Communication: Mobility status PT Visit Diagnosis: Other abnormalities of gait and mobility (R26.89);Muscle weakness (generalized) (M62.81);Difficulty in walking, not elsewhere classified (R26.2);Unsteadiness on feet (R26.81);History of falling (Z91.81)     Time: 1121-1203 PT Time Calculation (min) (ACUTE ONLY): 42 min  Charges:  $Gait Training: 23-37 mins $Therapeutic Exercise: 8-22 mins                     1:04 PM, 06/26/20 Luanna Weesner A. Saverio Danker PT, DPT Physical Therapist - [facility]   Kentley Cedillo A Myeesha Shane 06/26/2020, 1:04 PM

## 2020-06-26 NOTE — Progress Notes (Signed)
PROGRESS NOTE    Andres White  ZOX:[id number] DOB: 11-26-1945 DOA: 06/24/2020 PCP: [name], MD   Brief Narrative: Taken from H&P. Andres White is a 74 y.o. male with medical history significant of hypertension, arthritis, tobacco use, comes to the hospital with complaints of right leg weakness.  Patient tells me that he has been weak in his right leg since Wednesday, for about 4 days.  He noticed that his balance has been affected and he has difficulties walking.  His friend who is in the room also says that his speech has been a little bit different however patient cannot appreciate that.  He denies any numbness or tingling in his legs.  He denies any right arm weakness.  MRI brain with a small acute infarct in the left midbrain as well as several chronic infarcts.  PT recommended CIR, patient is being accepted for CIR pending insurance authorization. 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  Neurology evaluated him and they are recommending outpatient vascular surgery follow-up. Echocardiogram with normal EF, no wall motion abnormalities and grade 1 diastolic dysfunction.  No obvious source of cardiac emboli. Patient had recurrent small nonsustained episodes of V. tach.  Remain asymptomatic.  He was given a cardiac monitor and will follow up with cardiology as an outpatient.  Subjective: Patient was lying down comfortably in his bed when seen today.  No new complaint.  Daughter at bedside.  Assessment & Plan:   Active Problems:   CVA (cerebral vascular accident) ([place])   Right leg weakness  Acute CVA with left midbrain infarct.  Patient with hypertension. PT is recommending CIR-patient is being accepted for inpatient rehab, awaiting insurance authorization. Neurology evaluated him and they are also recommending outpatient vascular surgery follow-up for left ICA stenosis. -He was started on aspirin and  Lipitor. -He was counseled for smoking cessation.  Essential hypertension.  Blood pressure elevated.  Initially his home antihypertensives were held for permissive hypertension. -Restarting home antihypertensives as symptoms are going on for few days now. -Continue to monitor.    Alcohol use.  Patient denies any withdrawal.  Drinks 1 can of beer per day. -Continue to monitor. -Can be placed on CIWA protocol if needed.  Objective: Vitals:   06/26/20 0354 06/26/20 0734 06/26/20 1244 06/26/20 1602  BP: (!) 143/84 (!) 163/76  110/69  Pulse: (!) 50 (!) 52  74  Resp: 18 18  18   Temp: (!) 97.5 F (36.4 C) 98 F (36.7 C)  98.7 F (37.1 C)  TempSrc: Oral Oral  Oral  SpO2: 100% 98% 92% 99%  Weight:      Height:        Intake/Output Summary (Last 24 hours) at 06/26/2020 1634 Last data filed at 06/26/2020 1125 Gross per 24 hour  Intake 240 ml  Output 1300 ml  Net -1060 ml   Filed Weights   06/24/20 1120  Weight: 74.4 kg    Examination:  General.  Well-developed gentleman, in no acute distress. Pulmonary.  Lungs clear bilaterally, normal respiratory effort. CV.  Regular rate and rhythm, no JVD, rub or murmur. Abdomen.  Soft, nontender, nondistended, BS positive. CNS.  Alert and oriented x2.  No focal neurologic deficit. Extremities.  No edema, no cyanosis, pulses intact and symmetrical. Psychiatry.  Judgment and insight appears normal.    DVT prophylaxis: Lovenox Code Status: Full Family Communication: Daughter was updated at bedside Disposition Plan:  Status is: Inpatient  Remains  inpatient appropriate because:Inpatient level of care appropriate due to severity of illness   Dispo: The patient is from: Home              Anticipated d/c is to: CIR              Anticipated d/c date is: 1 day              Patient currently is medically stable to d/c.  Waiting for insurance authorization for CIR.   Consultants:   Neurology  Procedures:  Antimicrobials:   Data  Reviewed: I have personally reviewed following labs and imaging studies  CBC: Recent Labs  Lab 06/24/20 1201  WBC 7.8  HGB 14.2  HCT 44.1  MCV 94.8  PLT 678   Basic Metabolic Panel: Recent Labs  Lab 06/24/20 1201  NA 142  K 3.6  CL 109  CO2 22  GLUCOSE 98  BUN 16  CREATININE 1.13  CALCIUM 9.7   GFR: Estimated Creatinine Clearance: 52 mL/min (by C-G formula based on SCr of 1.13 mg/dL). Liver Function Tests: Recent Labs  Lab 06/24/20 1201  AST 50*  ALT 37  ALKPHOS 78  BILITOT 1.0  PROT 8.5*  ALBUMIN 4.2   No results for input(s): LIPASE, AMYLASE in the last 168 hours. No results for input(s): AMMONIA in the last 168 hours. Coagulation Profile: Recent Labs  Lab 06/24/20 1636  INR 1.1   Cardiac Enzymes: No results for input(s): CKTOTAL, CKMB, CKMBINDEX, TROPONINI in the last 168 hours. BNP (last 3 results) No results for input(s): PROBNP in the last 8760 hours. HbA1C: Recent Labs    06/25/20 0441  HGBA1C 4.8   CBG: No results for input(s): GLUCAP in the last 168 hours. Lipid Profile: Recent Labs    06/25/20 0441  CHOL 96  HDL 47  LDLCALC 32  TRIG 87  CHOLHDL 2.0   Thyroid Function Tests: No results for input(s): TSH, T4TOTAL, FREET4, T3FREE, THYROIDAB in the last 72 hours. Anemia Panel: No results for input(s): VITAMINB12, FOLATE, FERRITIN, TIBC, IRON, RETICCTPCT in the last 72 hours. Sepsis Labs: No results for input(s): PROCALCITON, LATICACIDVEN in the last 168 hours.  Recent Results (from the past 240 hour(s))  Resp Panel by RT-PCR (Flu A&B, Covid) Nasopharyngeal Swab     Status: None   Collection Time: 06/24/20  4:37 PM   Specimen: Nasopharyngeal Swab; Nasopharyngeal(NP) swabs in vial transport medium  Result Value Ref Range Status   SARS Coronavirus 2 by RT PCR NEGATIVE NEGATIVE Final    Comment: (NOTE) SARS-CoV-2 target nucleic acids are NOT DETECTED.  The SARS-CoV-2 RNA is generally detectable in upper respiratory specimens  during the acute phase of infection. The lowest concentration of SARS-CoV-2 viral copies this assay can detect is 138 copies/mL. A negative result does not preclude SARS-Cov-2 infection and should not be used as the sole basis for treatment or other patient management decisions. A negative result may occur with  improper specimen collection/handling, submission of specimen other than nasopharyngeal swab, presence of viral mutation(s) within the areas targeted by this assay, and inadequate number of viral copies(<138 copies/mL). A negative result must be combined with clinical observations, patient history, and epidemiological information. The expected result is Negative.  Fact Sheet for Patients:  EntrepreneurPulse.com.au  Fact Sheet for Healthcare Providers:  IncredibleEmployment.be  This test is no t yet approved or cleared by the [place] FDA and  has been authorized for detection and/or diagnosis of SARS-CoV-2 by FDA under an Emergency  Use Authorization (EUA). This EUA will remain  in effect (meaning this test can be used) for the duration of the COVID-19 declaration under Section 564(b)(1) of the Act, 21 U.S.C.section 360bbb-3(b)(1), unless the authorization is terminated  or revoked sooner.       Influenza A by PCR NEGATIVE NEGATIVE Final   Influenza B by PCR NEGATIVE NEGATIVE Final    Comment: (NOTE) The Xpert Xpress SARS-CoV-2/FLU/RSV plus assay is intended as an aid in the diagnosis of influenza from Nasopharyngeal swab specimens and should not be used as a sole basis for treatment. Nasal washings and aspirates are unacceptable for Xpert Xpress SARS-CoV-2/FLU/RSV testing.  Fact Sheet for Patients: EntrepreneurPulse.com.au  Fact Sheet for Healthcare Providers: IncredibleEmployment.be  This test is not yet approved or cleared by the [place] FDA and has been authorized for detection  and/or diagnosis of SARS-CoV-2 by FDA under an Emergency Use Authorization (EUA). This EUA will remain in effect (meaning this test can be used) for the duration of the COVID-19 declaration under Section 564(b)(1) of the Act, 21 U.S.C. section 360bbb-3(b)(1), unless the authorization is terminated or revoked.  Performed at [facility], [address] [place], [place] [address]      Radiology Studies: US Carotid Bilateral (at [facility] and [facility] only)  Result Date: 06/25/2020 CLINICAL DATA:  74 year old male with stroke EXAM: BILATERAL CAROTID DUPLEX ULTRASOUND TECHNIQUE: Pearline Cables scale imaging, color Doppler and duplex ultrasound were performed of bilateral carotid and vertebral arteries in the neck. COMPARISON:  None. FINDINGS: Criteria: Quantification of carotid stenosis is based on velocity parameters that correlate the residual internal carotid diameter with NASCET-based stenosis levels, using the diameter of the distal internal carotid lumen as the denominator for stenosis measurement. The following velocity measurements were obtained: RIGHT ICA:  Systolic 72 cm/sec, Diastolic 17 cm/sec CCA:  90 cm/sec SYSTOLIC ICA/CCA RATIO:  0.8 ECA:  62 cm/sec LEFT ICA:  Systolic 712 cm/sec, Diastolic 38 cm/sec CCA:  72 cm/sec SYSTOLIC ICA/CCA RATIO:  3.1 ECA:  177 cm/sec Right Brachial SBP: Not acquired Left Brachial SBP: Not acquired RIGHT CAROTID ARTERY: Calcification of the right common carotid artery. Intermediate waveform maintained. Moderate heterogeneous and partially calcified plaque at the right carotid bifurcation. No significant lumen shadowing. Low resistance waveform of the right ICA. Tortuosity RIGHT VERTEBRAL ARTERY: Antegrade flow with low resistance waveform. LEFT CAROTID ARTERY: Calcification of the left common carotid artery. Intermediate waveform maintained. Moderate heterogeneous and partially calcified plaque at the left carotid bifurcation. Calcifications present, contributing to  shadowing. Low resistance waveform of the left ICA. Tortuosity LEFT VERTEBRAL ARTERY:  Antegrade flow with low resistance waveform. IMPRESSION: Right: Color duplex indicates moderate heterogeneous and calcified plaque, with no hemodynamically significant stenosis by duplex criteria in the extracranial cerebrovascular circulation. Left: Heterogeneous and partially calcified plaque at the left carotid bifurcation, with discordant results regarding degree of stenosis by established duplex criteria. Peak velocity suggests 70% - 99% stenosis, with the ICA/ CCA ratio suggesting a lesser degree of stenosis. If establishing a more accurate degree of stenosis is required, cerebral angiogram should be considered, or as a second best test, CTA. Signed, Dulcy Fanny. Dellia Nims, RPVI [facility] Electronically Signed   By: [name] D.O.   On: 06/25/2020 07:45   ECHOCARDIOGRAM COMPLETE  Result Date: 06/25/2020    ECHOCARDIOGRAM REPORT   Patient Name:   Andres White Date of Exam: 06/25/2020 Medical Rec #:  [id number]      Height:  67.0 in Accession #:    [id number]     Weight:       164.0 lb Date of Birth:  05/05/1946      BSA:          1.859 m Patient Age:    62 years       BP:           158/79 mmHg Patient Gender: M              HR:           85 bpm. Exam Location:  [facility] Procedure: 2D Echo, Cardiac Doppler and Color Doppler Indications:     Stroke 434.91  History:         Patient has no prior history of Echocardiogram examinations.                  Signs/Symptoms:Syncope; Risk Factors:Hypertension.  Sonographer:     Sherrie Sport RDCS (AE) Referring Phys:  [id number] [facility] Arwa Yero Diagnosing Phys: Neoma Laming MD  Sonographer Comments: Technically difficult study due to poor echo windows, no subcostal window and no apical window. IMPRESSIONS  1. Left ventricular ejection fraction, by estimation, is 60 to 65%. The left ventricle has normal function. The left  ventricle has no regional wall motion abnormalities. There is severe concentric left ventricular hypertrophy. Left ventricular diastolic  parameters are consistent with Grade I diastolic dysfunction (impaired relaxation).  2. Right ventricular systolic function is normal. The right ventricular size is normal.  3. The mitral valve is normal in structure. Mild mitral valve regurgitation. No evidence of mitral stenosis.  4. The aortic valve is normal in structure. Aortic valve regurgitation is not visualized. No aortic stenosis is present.  5. The inferior vena cava is normal in size with greater than 50% respiratory variability, suggesting right atrial pressure of 3 mmHg. FINDINGS  Left Ventricle: Left ventricular ejection fraction, by estimation, is 60 to 65%. The left ventricle has normal function. The left ventricle has no regional wall motion abnormalities. The left ventricular internal cavity size was normal in size. There is  severe concentric left ventricular hypertrophy. Left ventricular diastolic parameters are consistent with Grade I diastolic dysfunction (impaired relaxation). Right Ventricle: The right ventricular size is normal. No increase in right ventricular wall thickness. Right ventricular systolic function is normal. Left Atrium: Left atrial size was normal in size. Right Atrium: Right atrial size was normal in size. Pericardium: There is no evidence of pericardial effusion. Mitral Valve: The mitral valve is normal in structure. Mild mitral valve regurgitation. No evidence of mitral valve stenosis. Tricuspid Valve: The tricuspid valve is normal in structure. Tricuspid valve regurgitation is mild . No evidence of tricuspid stenosis. Aortic Valve: The aortic valve is normal in structure. Aortic valve regurgitation is not visualized. No aortic stenosis is present. Pulmonic Valve: The pulmonic valve was normal in structure. Pulmonic valve regurgitation is not visualized. No evidence of pulmonic  stenosis. Aorta: The aortic root is normal in size and structure. Venous: The inferior vena cava is normal in size with greater than 50% respiratory variability, suggesting right atrial pressure of 3 mmHg. IAS/Shunts: No atrial level shunt detected by color flow Doppler.  LEFT VENTRICLE PLAX 2D LVIDd:         3.79 cm LVIDs:         2.34 cm LV PW:         1.13 cm LV IVS:        1.34 cm LVOT diam:  2.00 cm LVOT Area:     3.14 cm  LEFT ATRIUM         Index LA diam:    4.40 cm 2.37 cm/m                        PULMONIC VALVE AORTA                 PV Vmax:        0.44 m/s Ao Root diam: 3.10 cm PV Peak grad:   0.8 mmHg                       RVOT Peak grad: 1 mmHg   SHUNTS Systemic Diam: 2.00 cm Neoma Laming MD Electronically signed by Neoma Laming MD Signature Date/Time: 06/25/2020/11:58:34 AM    Final     Scheduled Meds: . amLODipine  10 mg Oral Daily  . aspirin EC  81 mg Oral Daily  . atorvastatin  40 mg Oral Daily  . citalopram  20 mg Oral Daily  . enoxaparin (LOVENOX) injection  40 mg Subcutaneous Q24H  . lisinopril  40 mg Oral Daily  . tamsulosin  0.4 mg Oral Daily   Continuous Infusions:   LOS: 2 days   Time spent: 30 minutes.  Lorella Nimrod, MD [facility]  If 7PM-7AM, please contact night-coverage [email or web address]  06/26/2020, 4:34 PM   This record has been created using Systems analyst. Errors have been sought and corrected,but may not always be located. Such creation errors do not reflect on the standard of care.

## 2020-06-26 NOTE — TOC Progression Note (Signed)
Transition of Care [facility]) - Progression Note    Patient Details  Name: ALDRICH LLOYD MRN: [id number] Date of Birth: Aug 26, 1945  [facility]) CM/SW Contact  Shelbie Hutching, RN Phone Number: 06/26/2020, 3:02 PM  Clinical Narrative:    Plan is for patient to go to CIR at discharge.  Insurance authorization is pending for CIR, patient is ready for DC once insurance auth approved.    Expected Discharge Plan: IP Rehab Facility Barriers to Discharge: Continued Medical Work up  Expected Discharge Plan and Services Expected Discharge Plan: [place]   Discharge Planning Services: CM Consult Post Acute Care Choice: IP Rehab Living arrangements for the past 2 months: Single Family Home                   DME Agency: NA       HH Arranged: NA           Social Determinants of Health (SDOH) Interventions    Readmission Risk Interventions No flowsheet data found.

## 2020-06-26 NOTE — Progress Notes (Signed)
Subjective: continues to improve and improvement in R sided weakness   Past Medical History:  Diagnosis Date  . Arthritis   . Chronic back pain   . Gout   . Hypertension   . Syncope and collapse     Past Surgical History:  Procedure Laterality Date  . CARDIAC CATHETERIZATION  2015   [facility]   . KNEE SURGERY      Family History  Problem Relation Age of Onset  . Hypertension Mother     Social History:  reports that he has been smoking cigarettes. He has smoked for the past 20.00 years. He has never used smokeless tobacco. He reports current alcohol use. He reports that he does not use drugs.  No Known Allergies  Medications: I have reviewed the patient's current medications.  Physical Examination: Blood pressure (!) 163/76, pulse (!) 52, temperature 98 F (36.7 C), temperature source Oral, resp. rate 18, height 5\' 7"  (1.702 m), weight 74.4 kg, SpO2 98 %.   Neurological Examination   Alert, oriented, thought content appropriate.  Dysarthria Cranial Nerves: II: Discs flat bilaterally; Visual fields grossly normal, pupils equal, round, reactive to light and accommodation III,IV, VI: ptosis not present, extra-ocular motions intact bilaterally V,VII: R facial droop  VIII: hearing normal bilaterally IX,X: gag reflex present XI: bilateral shoulder shrug XII: midline tongue extension Motor: Right : Upper extremity   5/5    Left:     Upper extremity   5/5  Lower extremity   4+/5     Lower extremity   5/5 Tone and bulk:normal tone throughout; no atrophy noted Sensory: Pinprick and light touch intact throughout, bilaterally  Laboratory Studies:   Basic Metabolic Panel: Recent Labs  Lab 06/24/20 1201  NA 142  K 3.6  CL 109  CO2 22  GLUCOSE 98  BUN 16  CREATININE 1.13  CALCIUM 9.7    Liver Function Tests: Recent Labs  Lab 06/24/20 1201  AST 50*  ALT 37  ALKPHOS 78  BILITOT 1.0  PROT 8.5*  ALBUMIN 4.2   No results for input(s): LIPASE, AMYLASE in the last 168  hours. No results for input(s): AMMONIA in the last 168 hours.  CBC: Recent Labs  Lab 06/24/20 1201  WBC 7.8  HGB 14.2  HCT 44.1  MCV 94.8  PLT 176    Cardiac Enzymes: No results for input(s): CKTOTAL, CKMB, CKMBINDEX, TROPONINI in the last 168 hours.  BNP: Invalid input(s): POCBNP  CBG: No results for input(s): GLUCAP in the last 168 hours.  Microbiology: Results for orders placed or performed during the hospital encounter of 06/24/20  Resp Panel by RT-PCR (Flu A&B, Covid) Nasopharyngeal Swab     Status: None   Collection Time: 06/24/20  4:37 PM   Specimen: Nasopharyngeal Swab; Nasopharyngeal(NP) swabs in vial transport medium  Result Value Ref Range Status   SARS Coronavirus 2 by RT PCR NEGATIVE NEGATIVE Final    Comment: (NOTE) SARS-CoV-2 target nucleic acids are NOT DETECTED.  The SARS-CoV-2 RNA is generally detectable in upper respiratory specimens during the acute phase of infection. The lowest concentration of SARS-CoV-2 viral copies this assay can detect is 138 copies/mL. A negative result does not preclude SARS-Cov-2 infection and should not be used as the sole basis for treatment or other patient management decisions. A negative result may occur with  improper specimen collection/handling, submission of specimen other than nasopharyngeal swab, presence of viral mutation(s) within the areas targeted by this assay, and inadequate number of viral copies(<138 copies/mL).  A negative result must be combined with clinical observations, patient history, and epidemiological information. The expected result is Negative.  Fact Sheet for Patients:  EntrepreneurPulse.com.au  Fact Sheet for Healthcare Providers:  IncredibleEmployment.be  This test is no t yet approved or cleared by the [place] FDA and  has been authorized for detection and/or diagnosis of SARS-CoV-2 by FDA under an Emergency Use Authorization (EUA). This EUA  will remain  in effect (meaning this test can be used) for the duration of the COVID-19 declaration under Section 564(b)(1) of the Act, 21 U.S.C.section 360bbb-3(b)(1), unless the authorization is terminated  or revoked sooner.       Influenza A by PCR NEGATIVE NEGATIVE Final   Influenza B by PCR NEGATIVE NEGATIVE Final    Comment: (NOTE) The Xpert Xpress SARS-CoV-2/FLU/RSV plus assay is intended as an aid in the diagnosis of influenza from Nasopharyngeal swab specimens and should not be used as a sole basis for treatment. Nasal washings and aspirates are unacceptable for Xpert Xpress SARS-CoV-2/FLU/RSV testing.  Fact Sheet for Patients: EntrepreneurPulse.com.au  Fact Sheet for Healthcare Providers: IncredibleEmployment.be  This test is not yet approved or cleared by the [place] FDA and has been authorized for detection and/or diagnosis of SARS-CoV-2 by FDA under an Emergency Use Authorization (EUA). This EUA will remain in effect (meaning this test can be used) for the duration of the COVID-19 declaration under Section 564(b)(1) of the Act, 21 U.S.C. section 360bbb-3(b)(1), unless the authorization is terminated or revoked.  Performed at [facility], [address] [place], [place] [address]     Coagulation Studies: Recent Labs    06/24/20 1636  LABPROT 13.7  INR 1.1    Urinalysis:  Recent Labs  Lab 06/24/20 0732  COLORURINE YELLOW*  LABSPEC 1.014  PHURINE 5.0  GLUCOSEU NEGATIVE  HGBUR NEGATIVE  BILIRUBINUR NEGATIVE  KETONESUR 20*  PROTEINUR NEGATIVE  NITRITE NEGATIVE  LEUKOCYTESUR MODERATE*    Lipid Panel:     Component Value Date/Time   CHOL 96 06/25/2020 0441   TRIG 87 06/25/2020 0441   HDL 47 06/25/2020 0441   CHOLHDL 2.0 06/25/2020 0441   VLDL 17 06/25/2020 0441   LDLCALC 32 06/25/2020 0441    HgbA1C:  Lab Results  Component Value Date   HGBA1C 4.8 06/25/2020    Urine Drug Screen:       Component Value Date/Time   LABOPIA NONE DETECTED 06/24/2020 0732   COCAINSCRNUR NONE DETECTED 06/24/2020 0732   LABBENZ NONE DETECTED 06/24/2020 0732   AMPHETMU NONE DETECTED 06/24/2020 0732   THCU NONE DETECTED 06/24/2020 0732   LABBARB NONE DETECTED 06/24/2020 0732    Alcohol Level: No results for input(s): ETH in the last 168 hours.  Other results: EKG: normal EKG, normal sinus rhythm, unchanged from previous tracings.  Imaging: CT Head Wo Contrast  Result Date: 06/24/2020 CLINICAL DATA:  Right leg pain EXAM: CT HEAD WITHOUT CONTRAST TECHNIQUE: Contiguous axial images were obtained from the base of the skull through the vertex without intravenous contrast. COMPARISON:  None. FINDINGS: Brain: There is no acute intracranial hemorrhage, mass effect, or edema. No definite acute appearing loss of gray-white differentiation. There are multiple chronic infarcts including involvement of the left frontal lobe, right parietotemporal lobe, and bilateral cerebellar hemispheres. Age-indeterminate though probably chronic infarct of the left thalamus. Additional patchy and confluent areas of hypoattenuation in the supratentorial white matter are nonspecific but probably reflect moderate chronic microvascular ischemic changes. There is no hydrocephalus.  No extra-axial collection. Vascular:  There is atherosclerotic calcification at the skull base. Skull: Calvarium is unremarkable. Sinuses/Orbits: No acute finding. Other: None. IMPRESSION: No acute intracranial hemorrhage or mass effect. Multiple chronic infarcts and moderate chronic microvascular ischemic changes. Age indeterminate though probably chronic infarct of the left thalamus. Electronically Signed   By: Macy Mis M.D.   On: 06/24/2020 13:29   MR BRAIN WO CONTRAST  Result Date: 06/24/2020 CLINICAL DATA:  Right leg pain, cannot bear weight EXAM: MRI HEAD WITHOUT CONTRAST TECHNIQUE: Multiplanar, multiecho pulse sequences of the brain and  surrounding structures were obtained without intravenous contrast. COMPARISON:  None. FINDINGS: Brain: There is a small acute infarct of the parasagittal left midbrain. Discontiguous small acute infarct of the left cerebral peduncle. Scattered chronic infarcts including involvement of the left frontal and parietal lobes, right parietotemporal lobe, bilateral thalamus, and bilateral cerebellum. There is minimal susceptibility associated with the right parietotemporal infarct likely reflecting chronic blood products. Additional patchy and confluent areas of T2 hyperintensity in the supratentorial white matter are nonspecific but probably reflect moderate chronic microvascular ischemic changes. There is no intracranial mass or mass effect. There is no hydrocephalus or extra-axial fluid collection. Vascular: Major vessel flow voids at the skull base are preserved. Skull and upper cervical spine: Normal marrow signal is preserved. Sinuses/Orbits: Paranasal sinuses are aerated. Orbits are unremarkable. Other: Sella is unremarkable.  Mastoid air cells are clear. IMPRESSION: Small acute infarcts of the left midbrain. Several chronic infarcts as described. Moderate chronic microvascular ischemic changes. Electronically Signed   By: Macy Mis M.D.   On: 06/24/2020 16:19   US Carotid Bilateral (at [facility] and [facility] only)  Result Date: 06/25/2020 CLINICAL DATA:  74 year old male with stroke EXAM: BILATERAL CAROTID DUPLEX ULTRASOUND TECHNIQUE: Pearline Cables scale imaging, color Doppler and duplex ultrasound were performed of bilateral carotid and vertebral arteries in the neck. COMPARISON:  None. FINDINGS: Criteria: Quantification of carotid stenosis is based on velocity parameters that correlate the residual internal carotid diameter with NASCET-based stenosis levels, using the diameter of the distal internal carotid lumen as the denominator for stenosis measurement. The following velocity measurements were obtained: RIGHT ICA:   Systolic 72 cm/sec, Diastolic 17 cm/sec CCA:  90 cm/sec SYSTOLIC ICA/CCA RATIO:  0.8 ECA:  62 cm/sec LEFT ICA:  Systolic 170 cm/sec, Diastolic 38 cm/sec CCA:  72 cm/sec SYSTOLIC ICA/CCA RATIO:  3.1 ECA:  177 cm/sec Right Brachial SBP: Not acquired Left Brachial SBP: Not acquired RIGHT CAROTID ARTERY: Calcification of the right common carotid artery. Intermediate waveform maintained. Moderate heterogeneous and partially calcified plaque at the right carotid bifurcation. No significant lumen shadowing. Low resistance waveform of the right ICA. Tortuosity RIGHT VERTEBRAL ARTERY: Antegrade flow with low resistance waveform. LEFT CAROTID ARTERY: Calcification of the left common carotid artery. Intermediate waveform maintained. Moderate heterogeneous and partially calcified plaque at the left carotid bifurcation. Calcifications present, contributing to shadowing. Low resistance waveform of the left ICA. Tortuosity LEFT VERTEBRAL ARTERY:  Antegrade flow with low resistance waveform. IMPRESSION: Right: Color duplex indicates moderate heterogeneous and calcified plaque, with no hemodynamically significant stenosis by duplex criteria in the extracranial cerebrovascular circulation. Left: Heterogeneous and partially calcified plaque at the left carotid bifurcation, with discordant results regarding degree of stenosis by established duplex criteria. Peak velocity suggests 70% - 99% stenosis, with the ICA/ CCA ratio suggesting a lesser degree of stenosis. If establishing a more accurate degree of stenosis is required, cerebral angiogram should be considered, or as a second best test, CTA. Signed, Dulcy Fanny. Earleen Newport, DO, RPVI  [facility] Electronically Signed   By: [name] D.O.   On: 06/25/2020 07:45   ECHOCARDIOGRAM COMPLETE  Result Date: 06/25/2020    ECHOCARDIOGRAM REPORT   Patient Name:   Andres White Date of Exam: 06/25/2020 Medical Rec #:  [id number]       Height:       67.0 in Accession #:    [id number]     Weight:       164.0 lb Date of Birth:  [birth year]      BSA:          1.859 m Patient Age:    24 years       BP:           158/79 mmHg Patient Gender: M              HR:           85 bpm. Exam Location:  [facility] Procedure: 2D Echo, Cardiac Doppler and Color Doppler Indications:     Stroke 434.91  History:         Patient has no prior history of Echocardiogram examinations.                  Signs/Symptoms:Syncope; Risk Factors:Hypertension.  Sonographer:     Sherrie Sport RDCS (AE) Referring Phys:  [id number] [facility] AMIN Diagnosing Phys: Neoma Laming MD  Sonographer Comments: Technically difficult study due to poor echo windows, no subcostal window and no apical window. IMPRESSIONS  1. Left ventricular ejection fraction, by estimation, is 60 to 65%. The left ventricle has normal function. The left ventricle has no regional wall motion abnormalities. There is severe concentric left ventricular hypertrophy. Left ventricular diastolic  parameters are consistent with Grade I diastolic dysfunction (impaired relaxation).  2. Right ventricular systolic function is normal. The right ventricular size is normal.  3. The mitral valve is normal in structure. Mild mitral valve regurgitation. No evidence of mitral stenosis.  4. The aortic valve is normal in structure. Aortic valve regurgitation is not visualized. No aortic stenosis is present.  5. The inferior vena cava is normal in size with greater than 50% respiratory variability, suggesting right atrial pressure of 3 mmHg. FINDINGS  Left Ventricle: Left ventricular ejection fraction, by estimation, is 60 to 65%. The left ventricle has normal function. The left ventricle has no regional wall motion abnormalities. The left ventricular internal cavity size was normal in size. There is  severe concentric left ventricular hypertrophy. Left ventricular diastolic parameters are consistent with Grade I diastolic dysfunction (impaired  relaxation). Right Ventricle: The right ventricular size is normal. No increase in right ventricular wall thickness. Right ventricular systolic function is normal. Left Atrium: Left atrial size was normal in size. Right Atrium: Right atrial size was normal in size. Pericardium: There is no evidence of pericardial effusion. Mitral Valve: The mitral valve is normal in structure. Mild mitral valve regurgitation. No evidence of mitral valve stenosis. Tricuspid Valve: The tricuspid valve is normal in structure. Tricuspid valve regurgitation is mild . No evidence of tricuspid stenosis. Aortic Valve: The aortic valve is normal in structure. Aortic valve regurgitation is not visualized. No aortic stenosis is present. Pulmonic Valve: The pulmonic valve was normal in structure. Pulmonic valve regurgitation is not visualized. No evidence of pulmonic stenosis. Aorta: The aortic root is normal in size and structure. Venous: The inferior vena cava is normal in size with greater than 50% respiratory variability, suggesting right atrial pressure of  3 mmHg. IAS/Shunts: No atrial level shunt detected by color flow Doppler.  LEFT VENTRICLE PLAX 2D LVIDd:         3.79 cm LVIDs:         2.34 cm LV PW:         1.13 cm LV IVS:        1.34 cm LVOT diam:     2.00 cm LVOT Area:     3.14 cm  LEFT ATRIUM         Index LA diam:    4.40 cm 2.37 cm/m                        PULMONIC VALVE AORTA                 PV Vmax:        0.44 m/s Ao Root diam: 3.10 cm PV Peak grad:   0.8 mmHg                       RVOT Peak grad: 1 mmHg   SHUNTS Systemic Diam: 2.00 cm Neoma Laming MD Electronically signed by Neoma Laming MD Signature Date/Time: 06/25/2020/11:58:34 AM    Final      Assessment/Plan:   74 y.o. malewith medical history significant of hypertension, arthritis, tobacco use, comes to the hospital with complaints of right leg weakness.  Patient has been weak in his right leg since Wednesday, for about 4 days.   He is still smoking, and drinks  1 can of beer per day.  He denies any abdominal pain, nausea or vomiting.  No fever or chills. MRI with L Midbrain stroke. No prior anti platelet therapy  - L midbrain stroke - discussed with family at bedside with daughter yesterday and wife today  - strength continues to  improve - ASA/statin daily - appreciate pt/ot-- awaiting rehab placement 06/26/2020, 10:50 AM

## 2020-06-26 NOTE — Telephone Encounter (Signed)
ZIO XT applied to patient today at 1300. Patient is still waiting for bed at inpatient rehab with [facility]. Case Manager stated most likely tomorrow for a bed to be available.  Patient will still need to be scheduled for a 1 month follow up per Dr. Fletcher Anon. Will send to scheduling.

## 2020-06-26 NOTE — Telephone Encounter (Signed)
SCHEDULED with 1c Agricultural consultant

## 2020-06-27 ENCOUNTER — Inpatient Hospital Stay (HOSPITAL_COMMUNITY)
Admission: RE | Admit: 2020-06-27 | Discharge: 2020-07-18 | DRG: 057 | Disposition: A | Discharge status: Home Health | Payer: Medicare Other | Source: Other Acute Inpatient Hospital | Attending: Physical Medicine & Rehabilitation | Admitting: Physical Medicine & Rehabilitation

## 2020-06-27 ENCOUNTER — Other Ambulatory Visit: Payer: Self-pay

## 2020-06-27 ENCOUNTER — Encounter (HOSPITAL_COMMUNITY): Payer: Self-pay | Admitting: Physical Medicine & Rehabilitation

## 2020-06-27 DIAGNOSIS — G8929 Other chronic pain: Secondary | ICD-10-CM | POA: Diagnosis present

## 2020-06-27 DIAGNOSIS — M1909 Primary osteoarthritis, other specified site: Secondary | ICD-10-CM | POA: Diagnosis present

## 2020-06-27 DIAGNOSIS — I69322 Dysarthria following cerebral infarction: Secondary | ICD-10-CM

## 2020-06-27 DIAGNOSIS — E785 Hyperlipidemia, unspecified: Secondary | ICD-10-CM | POA: Diagnosis present

## 2020-06-27 DIAGNOSIS — E871 Hypo-osmolality and hyponatremia: Secondary | ICD-10-CM | POA: Diagnosis not present

## 2020-06-27 DIAGNOSIS — D649 Anemia, unspecified: Secondary | ICD-10-CM | POA: Diagnosis not present

## 2020-06-27 DIAGNOSIS — I69392 Facial weakness following cerebral infarction: Secondary | ICD-10-CM

## 2020-06-27 DIAGNOSIS — Z79899 Other long term (current) drug therapy: Secondary | ICD-10-CM | POA: Diagnosis not present

## 2020-06-27 DIAGNOSIS — N390 Urinary tract infection, site not specified: Secondary | ICD-10-CM | POA: Diagnosis not present

## 2020-06-27 DIAGNOSIS — F17210 Nicotine dependence, cigarettes, uncomplicated: Secondary | ICD-10-CM | POA: Diagnosis present

## 2020-06-27 DIAGNOSIS — D62 Acute posthemorrhagic anemia: Secondary | ICD-10-CM | POA: Diagnosis present

## 2020-06-27 DIAGNOSIS — F32A Depression, unspecified: Secondary | ICD-10-CM | POA: Diagnosis present

## 2020-06-27 DIAGNOSIS — K640 First degree hemorrhoids: Secondary | ICD-10-CM | POA: Diagnosis present

## 2020-06-27 DIAGNOSIS — R000 Tachycardia, unspecified: Secondary | ICD-10-CM

## 2020-06-27 DIAGNOSIS — N179 Acute kidney failure, unspecified: Secondary | ICD-10-CM | POA: Diagnosis present

## 2020-06-27 DIAGNOSIS — M792 Neuralgia and neuritis, unspecified: Secondary | ICD-10-CM | POA: Diagnosis not present

## 2020-06-27 DIAGNOSIS — I129 Hypertensive chronic kidney disease with stage 1 through stage 4 chronic kidney disease, or unspecified chronic kidney disease: Secondary | ICD-10-CM | POA: Diagnosis present

## 2020-06-27 DIAGNOSIS — I69320 Aphasia following cerebral infarction: Secondary | ICD-10-CM

## 2020-06-27 DIAGNOSIS — R2990 Unspecified symptoms and signs involving the nervous system: Secondary | ICD-10-CM | POA: Insufficient documentation

## 2020-06-27 DIAGNOSIS — K317 Polyp of stomach and duodenum: Secondary | ICD-10-CM | POA: Diagnosis not present

## 2020-06-27 DIAGNOSIS — R195 Other fecal abnormalities: Secondary | ICD-10-CM | POA: Diagnosis not present

## 2020-06-27 DIAGNOSIS — M549 Dorsalgia, unspecified: Secondary | ICD-10-CM | POA: Diagnosis present

## 2020-06-27 DIAGNOSIS — Y92239 Unspecified place in hospital as the place of occurrence of the external cause: Secondary | ICD-10-CM | POA: Diagnosis present

## 2020-06-27 DIAGNOSIS — R0789 Other chest pain: Secondary | ICD-10-CM | POA: Diagnosis present

## 2020-06-27 DIAGNOSIS — Z7982 Long term (current) use of aspirin: Secondary | ICD-10-CM | POA: Diagnosis not present

## 2020-06-27 DIAGNOSIS — K635 Polyp of colon: Secondary | ICD-10-CM | POA: Diagnosis present

## 2020-06-27 DIAGNOSIS — M19031 Primary osteoarthritis, right wrist: Secondary | ICD-10-CM | POA: Diagnosis present

## 2020-06-27 DIAGNOSIS — K2970 Gastritis, unspecified, without bleeding: Secondary | ICD-10-CM | POA: Diagnosis present

## 2020-06-27 DIAGNOSIS — I69319 Unspecified symptoms and signs involving cognitive functions following cerebral infarction: Secondary | ICD-10-CM | POA: Diagnosis not present

## 2020-06-27 DIAGNOSIS — I472 Ventricular tachycardia: Secondary | ICD-10-CM | POA: Diagnosis present

## 2020-06-27 DIAGNOSIS — R1311 Dysphagia, oral phase: Secondary | ICD-10-CM | POA: Diagnosis present

## 2020-06-27 DIAGNOSIS — I69398 Other sequelae of cerebral infarction: Secondary | ICD-10-CM

## 2020-06-27 DIAGNOSIS — I959 Hypotension, unspecified: Secondary | ICD-10-CM | POA: Diagnosis present

## 2020-06-27 DIAGNOSIS — I69351 Hemiplegia and hemiparesis following cerebral infarction affecting right dominant side: Secondary | ICD-10-CM | POA: Diagnosis present

## 2020-06-27 DIAGNOSIS — Z8679 Personal history of other diseases of the circulatory system: Secondary | ICD-10-CM | POA: Diagnosis not present

## 2020-06-27 DIAGNOSIS — M19071 Primary osteoarthritis, right ankle and foot: Secondary | ICD-10-CM | POA: Diagnosis present

## 2020-06-27 DIAGNOSIS — H538 Other visual disturbances: Secondary | ICD-10-CM | POA: Diagnosis present

## 2020-06-27 DIAGNOSIS — I69391 Dysphagia following cerebral infarction: Secondary | ICD-10-CM

## 2020-06-27 DIAGNOSIS — I6389 Other cerebral infarction: Secondary | ICD-10-CM

## 2020-06-27 DIAGNOSIS — M109 Gout, unspecified: Secondary | ICD-10-CM | POA: Diagnosis present

## 2020-06-27 DIAGNOSIS — I6523 Occlusion and stenosis of bilateral carotid arteries: Secondary | ICD-10-CM | POA: Diagnosis present

## 2020-06-27 DIAGNOSIS — I6522 Occlusion and stenosis of left carotid artery: Secondary | ICD-10-CM | POA: Diagnosis present

## 2020-06-27 DIAGNOSIS — R52 Pain, unspecified: Secondary | ICD-10-CM

## 2020-06-27 DIAGNOSIS — I10 Essential (primary) hypertension: Secondary | ICD-10-CM

## 2020-06-27 DIAGNOSIS — T380X5A Adverse effect of glucocorticoids and synthetic analogues, initial encounter: Secondary | ICD-10-CM | POA: Diagnosis present

## 2020-06-27 DIAGNOSIS — Z8249 Family history of ischemic heart disease and other diseases of the circulatory system: Secondary | ICD-10-CM

## 2020-06-27 DIAGNOSIS — R29898 Other symptoms and signs involving the musculoskeletal system: Secondary | ICD-10-CM | POA: Diagnosis present

## 2020-06-27 DIAGNOSIS — N1832 Chronic kidney disease, stage 3b: Secondary | ICD-10-CM

## 2020-06-27 DIAGNOSIS — R059 Cough, unspecified: Secondary | ICD-10-CM

## 2020-06-27 DIAGNOSIS — I952 Hypotension due to drugs: Secondary | ICD-10-CM

## 2020-06-27 DIAGNOSIS — I639 Cerebral infarction, unspecified: Secondary | ICD-10-CM | POA: Diagnosis not present

## 2020-06-27 DIAGNOSIS — K2950 Unspecified chronic gastritis without bleeding: Secondary | ICD-10-CM | POA: Diagnosis present

## 2020-06-27 DIAGNOSIS — Z86010 Personal history of colonic polyps: Secondary | ICD-10-CM

## 2020-06-27 DIAGNOSIS — I69393 Ataxia following cerebral infarction: Secondary | ICD-10-CM | POA: Diagnosis not present

## 2020-06-27 DIAGNOSIS — R208 Other disturbances of skin sensation: Secondary | ICD-10-CM | POA: Diagnosis not present

## 2020-06-27 DIAGNOSIS — D72829 Elevated white blood cell count, unspecified: Secondary | ICD-10-CM | POA: Diagnosis not present

## 2020-06-27 DIAGNOSIS — M7989 Other specified soft tissue disorders: Secondary | ICD-10-CM | POA: Diagnosis not present

## 2020-06-27 DIAGNOSIS — R55 Syncope and collapse: Secondary | ICD-10-CM | POA: Diagnosis present

## 2020-06-27 DIAGNOSIS — L853 Xerosis cutis: Secondary | ICD-10-CM | POA: Diagnosis present

## 2020-06-27 DIAGNOSIS — H5712 Ocular pain, left eye: Secondary | ICD-10-CM | POA: Diagnosis not present

## 2020-06-27 DIAGNOSIS — M7021 Olecranon bursitis, right elbow: Secondary | ICD-10-CM | POA: Diagnosis present

## 2020-06-27 DIAGNOSIS — M1711 Unilateral primary osteoarthritis, right knee: Secondary | ICD-10-CM | POA: Diagnosis present

## 2020-06-27 DIAGNOSIS — G629 Polyneuropathy, unspecified: Secondary | ICD-10-CM | POA: Diagnosis present

## 2020-06-27 DIAGNOSIS — D72828 Other elevated white blood cell count: Secondary | ICD-10-CM | POA: Diagnosis present

## 2020-06-27 DIAGNOSIS — N189 Chronic kidney disease, unspecified: Secondary | ICD-10-CM | POA: Diagnosis present

## 2020-06-27 DIAGNOSIS — B952 Enterococcus as the cause of diseases classified elsewhere: Secondary | ICD-10-CM | POA: Diagnosis not present

## 2020-06-27 LAB — GLUCOSE, CAPILLARY: Glucose-Capillary: 141 mg/dL — ABNORMAL HIGH (ref 70–99)

## 2020-06-27 MED ORDER — PROCHLORPERAZINE MALEATE 5 MG PO TABS: 5.0000 mg | ORAL_TABLET | Freq: Four times a day (QID) | ORAL | Status: DC | PRN

## 2020-06-27 MED ORDER — AMLODIPINE BESYLATE 5 MG PO TABS
5.0000 mg | ORAL_TABLET | Freq: Every day | ORAL | Status: DC
  Administered 2020-06-27 – 2020-06-30 (×4): 5 mg via ORAL
  Filled 2020-06-27 (×4): qty 1

## 2020-06-27 MED ORDER — ASPIRIN EC 81 MG PO TBEC
81.0000 mg | DELAYED_RELEASE_TABLET | Freq: Every day | ORAL | Status: DC
  Administered 2020-06-28 – 2020-07-18 (×21): 81 mg via ORAL
  Filled 2020-06-27 (×21): qty 1

## 2020-06-27 MED ORDER — TRAZODONE HCL 50 MG PO TABS: 25.0000 mg | ORAL_TABLET | Freq: Every evening | ORAL | Status: DC | PRN

## 2020-06-27 MED ORDER — BISACODYL 10 MG RE SUPP
10.0000 mg | Freq: Every day | RECTAL | Status: DC | PRN
  Administered 2020-07-02 – 2020-07-17 (×3): 10 mg via RECTAL
  Filled 2020-06-27 (×4): qty 1

## 2020-06-27 MED ORDER — ENOXAPARIN SODIUM 40 MG/0.4ML ~~LOC~~ SOLN
40.0000 mg | SUBCUTANEOUS | Status: DC
  Administered 2020-06-27 – 2020-07-10 (×14): 40 mg via SUBCUTANEOUS
  Filled 2020-06-27 (×14): qty 0.4

## 2020-06-27 MED ORDER — CITALOPRAM HYDROBROMIDE 20 MG PO TABS
20.0000 mg | ORAL_TABLET | Freq: Every day | ORAL | Status: DC
  Administered 2020-06-28 – 2020-07-18 (×21): 20 mg via ORAL
  Filled 2020-06-27 (×21): qty 1

## 2020-06-27 MED ORDER — GUAIFENESIN-DM 100-10 MG/5ML PO SYRP: 5.0000 mL | ORAL_SOLUTION | Freq: Four times a day (QID) | ORAL | Status: DC | PRN

## 2020-06-27 MED ORDER — ATORVASTATIN CALCIUM 40 MG PO TABS
40.0000 mg | ORAL_TABLET | Freq: Every day | ORAL | Status: DC
  Administered 2020-06-28 – 2020-07-18 (×21): 40 mg via ORAL
  Filled 2020-06-27 (×21): qty 1

## 2020-06-27 MED ORDER — ALUM & MAG HYDROXIDE-SIMETH 200-200-20 MG/5ML PO SUSP
30.0000 mL | ORAL | Status: DC | PRN
  Administered 2020-07-01: 30 mL via ORAL
  Filled 2020-06-27: qty 30

## 2020-06-27 MED ORDER — ATORVASTATIN CALCIUM 40 MG PO TABS
40.0000 mg | ORAL_TABLET | Freq: Every day | ORAL | 2 refills | Status: DC
Start: 2020-06-27 — End: 2020-07-18

## 2020-06-27 MED ORDER — LISINOPRIL 20 MG PO TABS
40.0000 mg | ORAL_TABLET | Freq: Every day | ORAL | Status: DC
  Administered 2020-06-28 – 2020-06-30 (×3): 40 mg via ORAL
  Filled 2020-06-27 (×3): qty 2

## 2020-06-27 MED ORDER — SENNOSIDES-DOCUSATE SODIUM 8.6-50 MG PO TABS: 1.0000 | ORAL_TABLET | Freq: Every evening | ORAL | Status: DC | PRN

## 2020-06-27 MED ORDER — PROCHLORPERAZINE 25 MG RE SUPP: 12.5000 mg | Freq: Four times a day (QID) | RECTAL | Status: DC | PRN

## 2020-06-27 MED ORDER — DIPHENHYDRAMINE HCL 12.5 MG/5ML PO ELIX: 12.5000 mg | ORAL_SOLUTION | Freq: Four times a day (QID) | ORAL | Status: DC | PRN

## 2020-06-27 MED ORDER — POLYETHYLENE GLYCOL 3350 17 G PO PACK
17.0000 g | PACK | Freq: Every day | ORAL | Status: DC | PRN
  Administered 2020-06-30 – 2020-07-08 (×3): 17 g via ORAL
  Filled 2020-06-27 (×3): qty 1

## 2020-06-27 MED ORDER — ACETAMINOPHEN 325 MG PO TABS
325.0000 mg | ORAL_TABLET | ORAL | Status: DC | PRN
  Administered 2020-06-29 – 2020-07-17 (×11): 650 mg via ORAL
  Administered 2020-07-17: 325 mg via ORAL
  Administered 2020-07-18: 650 mg via ORAL
  Filled 2020-06-27 (×13): qty 2

## 2020-06-27 MED ORDER — HYDROCERIN EX CREA
TOPICAL_CREAM | Freq: Two times a day (BID) | CUTANEOUS | Status: DC
  Administered 2020-07-01 – 2020-07-05 (×4): 1 via TOPICAL
  Filled 2020-06-27 (×3): qty 113

## 2020-06-27 MED ORDER — TAMSULOSIN HCL 0.4 MG PO CAPS
0.4000 mg | ORAL_CAPSULE | Freq: Every day | ORAL | Status: DC
  Administered 2020-06-28 – 2020-07-18 (×21): 0.4 mg via ORAL
  Filled 2020-06-27 (×21): qty 1

## 2020-06-27 MED ORDER — PROCHLORPERAZINE EDISYLATE 10 MG/2ML IJ SOLN: 5.0000 mg | Freq: Four times a day (QID) | INTRAMUSCULAR | Status: DC | PRN

## 2020-06-27 MED ORDER — ENSURE ENLIVE PO LIQD
237.0000 mL | Freq: Two times a day (BID) | ORAL | Status: DC
  Administered 2020-06-27 – 2020-06-29 (×4): 237 mL via ORAL

## 2020-06-27 MED ORDER — FLEET ENEMA 7-19 GM/118ML RE ENEM: 1.0000 | ENEMA | Freq: Once | RECTAL | Status: DC | PRN

## 2020-06-27 MED ORDER — AMLODIPINE BESYLATE 10 MG PO TABS: 10.0000 mg | ORAL_TABLET | Freq: Every day | ORAL | Status: DC

## 2020-06-27 NOTE — Progress Notes (Signed)
Patient arrived just after 1430 alert and oriented to name and place and situation but disoriented to time. No s/s of distress.

## 2020-06-27 NOTE — Progress Notes (Signed)
Inpatient Rehabilitation-Admissions Coordinator   Received insurance approval and medical clearance from Dr. Louanne Belton for admit to CIR today. Pt and his daughter notified of bed offer and they have accepted. Reviewed insurance benefit letter and all consent forms signed.All questions answered.   RN and TOC updated on plan.  [facility] transport set up. They are sending out a truck now.   Raechel Ache, OTR/L  Rehab Admissions Coordinator  [phone] 06/27/2020 11:32 AM

## 2020-06-27 NOTE — Discharge Summary (Signed)
Physician Discharge Summary  TRUST LEH ION:[id number] DOB: 03-08-1946 DOA: 06/24/2020  PCP: [name], MD  Admit date: 06/24/2020 Discharge date: 06/27/2020  Admitted From: Home  Discharge disposition: CIR  Recommendations for Outpatient Follow-Up:    Follow up with your primary care provider after discharge from rehab including neurology  Check CBC, BMP, magnesium in the next visit   Neurology recommend outpatient vascular surgery follow-up for left ICA stenosis.  Follow-up appointments will be needed  Follow-up with cardiology for arrhythmia monitoring, appointment has been made.[name]   Discharge Diagnosis:   Active Problems:   CVA (cerebral vascular accident) ([place])   Right leg weakness   Discharge Condition: Improved.  Diet recommendation: Low sodium, heart healthy.    Wound care: None.  Code status: Full.   History of Present Illness:  Andres White is a 74 y.o. male with medical history significant of hypertension, arthritis, tobacco use disorder presented to the hospital with complaints of right leg weakness for 4 days with balance issues and difficulty walking.  He had mild change in his speech.  Patient presented to hospital and was noted to have small acute infarct in the left midbrain as well as several chronic infarcts.   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  Neurology evaluated the patient and recommended outpatient vascular surgery follow-up.  A 2D Echocardiogram with normal EF, no wall motion abnormalities and grade 1 diastolic dysfunction.  No obvious source of cardiac emboli. Patient had recurrent small nonsustained episodes of V. tach.  Remained asymptomatic.    Plan was to follow-up with cardiology as an outpatient. PT recommended CIR, patient is being accepted for CIR.  Hospital Course:   Following conditions were addressed during hospitalization as listed below,  Acute  CVA with left midbrain infarct.    History of hypertension.  At this time, patient has been seen by neurology.  On aspirin and increased dose of Lipitor.    Neurology recommend outpatient vascular surgery follow-up for left ICA stenosis.  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   At this time, patient has been seen by physical therapy and recommended CIR. patient will need to follow-up with cardiology as outpatient for arrhythmia monitoring.   Essential hypertension.   Initially antihypertensives were on hold.  Has been resumed at this time.  Continue amlodipine and lisinopril on discharge.   Alcohol use disorder.    Patient states he drinks  1 can of beer per day.  Not in withdrawal.   Tobacco use disorder.  Patient was counseled about it.   Disposition.  At this time, patient is stable for disposition to CIR.  Medical Consultants:   Neurology Procedures:    None  Subjective:   Today, patient denies pain, nausea, vomiting. Has mild weakness.   Discharge Exam:   Vitals:   06/27/20 0730 06/27/20 1126  BP: (!) 106/57 119/69  Pulse: 69 74  Resp: 18 19  Temp: 97.9 F (36.6 C) 98.3 F (36.8 C)  SpO2: 97% 97%   Vitals:   06/27/20 0015 06/27/20 0503 06/27/20 0730 06/27/20 1126  BP: (!) 98/59 108/62 (!) 106/57 119/69  Pulse: 78 (!) 47 69 74  Resp: 16 19 18 19   Temp: 99.1 F (37.3 C) 98.4 F (36.9 C) 97.9 F (36.6 C) 98.3 F (36.8 C)  TempSrc: Oral Oral Oral Oral  SpO2: 97% 98% 97% 97%  Weight:  Height:        General: Alert awake, not in obvious distress HENT: pupils equally reacting to light,  No scleral pallor or icterus noted. Oral mucosa is moist.  Chest:  Clear breath sounds.  Diminished breath sounds bilaterally. No crackles or wheezes.  CVS: S1 &S2 heard. No murmur.  Regular rate and rhythm. Abdomen: Soft, nontender, nondistended.  Bowel sounds are heard.   Extremities: No cyanosis, clubbing or  edema.  Peripheral pulses are palpable. Psych: Alert, awake and oriented, normal mood CNS:  Left facial weakness,. Right sided mild weakness  Skin: Warm and dry.  No rashes noted.  The results of significant diagnostics from this hospitalization (including imaging, microbiology, ancillary and laboratory) are listed below for reference.     Diagnostic Studies:   ECHOCARDIOGRAM COMPLETE  Result Date: 06/25/2020    ECHOCARDIOGRAM REPORT   Patient Name:   [name] Date of Exam: 06/25/2020 Medical Rec #:  [id number]      Height:       67.0 in Accession #:    [id number]     Weight:       164.0 lb Date of Birth:  [birth year]      BSA:          1.859 m Patient Age:    46 years       BP:           158/79 mmHg Patient Gender: M              HR:           85 bpm. Exam Location:  [facility] Procedure: 2D Echo, Cardiac Doppler and Color Doppler Indications:     Stroke 434.91  History:         Patient has no prior history of Echocardiogram examinations.                  Signs/Symptoms:Syncope; Risk Factors:Hypertension.  Sonographer:     Sherrie Sport RDCS (AE) Referring Phys:  [id number] [facility] AMIN Diagnosing Phys: Neoma Laming MD  Sonographer Comments: Technically difficult study due to poor echo windows, no subcostal window and no apical window. IMPRESSIONS  1. Left ventricular ejection fraction, by estimation, is 60 to 65%. The left ventricle has normal function. The left ventricle has no regional wall motion abnormalities. There is severe concentric left ventricular hypertrophy. Left ventricular diastolic  parameters are consistent with Grade I diastolic dysfunction (impaired relaxation).  2. Right ventricular systolic function is normal. The right ventricular size is normal.  3. The mitral valve is normal in structure. Mild mitral valve regurgitation. No evidence of mitral stenosis.  4. The aortic valve is normal in structure. Aortic valve regurgitation is not visualized. No aortic stenosis is present.  5. The  inferior vena cava is normal in size with greater than 50% respiratory variability, suggesting right atrial pressure of 3 mmHg. FINDINGS  Left Ventricle: Left ventricular ejection fraction, by estimation, is 60 to 65%. The left ventricle has normal function. The left ventricle has no regional wall motion abnormalities. The left ventricular internal cavity size was normal in size. There is  severe concentric left ventricular hypertrophy. Left ventricular diastolic parameters are consistent with Grade I diastolic dysfunction (impaired relaxation). Right Ventricle: The right ventricular size is normal. No increase in right ventricular wall thickness. Right ventricular systolic function is normal. Left Atrium: Left atrial size was normal in size. Right Atrium: Right atrial size was normal in size. Pericardium: There is no evidence of  pericardial effusion. Mitral Valve: The mitral valve is normal in structure. Mild mitral valve regurgitation. No evidence of mitral valve stenosis. Tricuspid Valve: The tricuspid valve is normal in structure. Tricuspid valve regurgitation is mild . No evidence of tricuspid stenosis. Aortic Valve: The aortic valve is normal in structure. Aortic valve regurgitation is not visualized. No aortic stenosis is present. Pulmonic Valve: The pulmonic valve was normal in structure. Pulmonic valve regurgitation is not visualized. No evidence of pulmonic stenosis. Aorta: The aortic root is normal in size and structure. Venous: The inferior vena cava is normal in size with greater than 50% respiratory variability, suggesting right atrial pressure of 3 mmHg. IAS/Shunts: No atrial level shunt detected by color flow Doppler.  LEFT VENTRICLE PLAX 2D LVIDd:         3.79 cm LVIDs:         2.34 cm LV PW:         1.13 cm LV IVS:        1.34 cm LVOT diam:     2.00 cm LVOT Area:     3.14 cm  LEFT ATRIUM         Index LA diam:    4.40 cm 2.37 cm/m                        PULMONIC VALVE AORTA                 PV  Vmax:        0.44 m/s Ao Root diam: 3.10 cm PV Peak grad:   0.8 mmHg                       RVOT Peak grad: 1 mmHg   SHUNTS Systemic Diam: 2.00 cm Neoma Laming MD Electronically signed by Neoma Laming MD Signature Date/Time: 06/25/2020/11:58:34 AM    Final      Labs:   Basic Metabolic Panel: Recent Labs  Lab 06/24/20 1201  NA 142  K 3.6  CL 109  CO2 22  GLUCOSE 98  BUN 16  CREATININE 1.13  CALCIUM 9.7   GFR Estimated Creatinine Clearance: 52 mL/min (by C-G formula based on SCr of 1.13 mg/dL). Liver Function Tests: Recent Labs  Lab 06/24/20 1201  AST 50*  ALT 37  ALKPHOS 78  BILITOT 1.0  PROT 8.5*  ALBUMIN 4.2   No results for input(s): LIPASE, AMYLASE in the last 168 hours. No results for input(s): AMMONIA in the last 168 hours. Coagulation profile Recent Labs  Lab 06/24/20 1636  INR 1.1    CBC: Recent Labs  Lab 06/24/20 1201  WBC 7.8  HGB 14.2  HCT 44.1  MCV 94.8  PLT 176   Cardiac Enzymes: No results for input(s): CKTOTAL, CKMB, CKMBINDEX, TROPONINI in the last 168 hours. BNP: Invalid input(s): POCBNP CBG: No results for input(s): GLUCAP in the last 168 hours. D-Dimer No results for input(s): DDIMER in the last 72 hours. Hgb A1c Recent Labs    06/25/20 0441  HGBA1C 4.8   Lipid Profile Recent Labs    06/25/20 0441  CHOL 96  HDL 47  LDLCALC 32  TRIG 87  CHOLHDL 2.0   Thyroid function studies No results for input(s): TSH, T4TOTAL, T3FREE, THYROIDAB in the last 72 hours.  Invalid input(s): FREET3 Anemia work up No results for input(s): VITAMINB12, FOLATE, FERRITIN, TIBC, IRON, RETICCTPCT in the last 72 hours. Microbiology Recent Results (from the past 240 hour(s))  Resp Panel by RT-PCR (Flu A&B, Covid) Nasopharyngeal Swab     Status: None   Collection Time: 06/24/20  4:37 PM   Specimen: Nasopharyngeal Swab; Nasopharyngeal(NP) swabs in vial transport medium  Result Value Ref Range Status   SARS Coronavirus 2 by RT PCR NEGATIVE  NEGATIVE Final    Comment: (NOTE) SARS-CoV-2 target nucleic acids are NOT DETECTED.  The SARS-CoV-2 RNA is generally detectable in upper respiratory specimens during the acute phase of infection. The lowest concentration of SARS-CoV-2 viral copies this assay can detect is 138 copies/mL. A negative result does not preclude SARS-Cov-2 infection and should not be used as the sole basis for treatment or other patient management decisions. A negative result may occur with  improper specimen collection/handling, submission of specimen other than nasopharyngeal swab, presence of viral mutation(s) within the areas targeted by this assay, and inadequate number of viral copies(<138 copies/mL). A negative result must be combined with clinical observations, patient history, and epidemiological information. The expected result is Negative.  Fact Sheet for Patients:  EntrepreneurPulse.com.au  Fact Sheet for Healthcare Providers:  IncredibleEmployment.be  This test is no t yet approved or cleared by the [place] FDA and  has been authorized for detection and/or diagnosis of SARS-CoV-2 by FDA under an Emergency Use Authorization (EUA). This EUA will remain  in effect (meaning this test can be used) for the duration of the COVID-19 declaration under Section 564(b)(1) of the Act, 21 U.S.C.section 360bbb-3(b)(1), unless the authorization is terminated  or revoked sooner.       Influenza A by PCR NEGATIVE NEGATIVE Final   Influenza B by PCR NEGATIVE NEGATIVE Final    Comment: (NOTE) The Xpert Xpress SARS-CoV-2/FLU/RSV plus assay is intended as an aid in the diagnosis of influenza from Nasopharyngeal swab specimens and should not be used as a sole basis for treatment. Nasal washings and aspirates are unacceptable for Xpert Xpress SARS-CoV-2/FLU/RSV testing.  Fact Sheet for Patients: EntrepreneurPulse.com.au  Fact Sheet for Healthcare  Providers: IncredibleEmployment.be  This test is not yet approved or cleared by the [place] FDA and has been authorized for detection and/or diagnosis of SARS-CoV-2 by FDA under an Emergency Use Authorization (EUA). This EUA will remain in effect (meaning this test can be used) for the duration of the COVID-19 declaration under Section 564(b)(1) of the Act, 21 U.S.C. section 360bbb-3(b)(1), unless the authorization is terminated or revoked.  Performed at [facility], [address] [place], [place] [address]      Discharge Instructions:   Discharge Instructions     Diet - low sodium heart healthy   Complete by: As directed    Discharge instructions   Complete by: As directed    Please follow-up with your primary care provider after discharge from rehabilitation.  Follow-up with neurology as outpatient.   Increase activity slowly   Complete by: As directed    As per Rehab      Allergies as of 06/27/2020   No Known Allergies      Medication List     STOP taking these medications    ibuprofen 800 MG tablet Commonly known as: ADVIL       TAKE these medications    amLODipine 10 MG tablet Commonly known as: NORVASC Take 10 mg by mouth daily.   aspirin EC 81 MG tablet Take 1 tablet (81 mg total) by mouth daily.   atorvastatin 40 MG tablet Commonly known as: LIPITOR Take 1 tablet (40 mg total) by mouth daily.  What changed:   medication strength  how much to take   citalopram 20 MG tablet Commonly known as: CELEXA Take 20 mg by mouth daily.   colchicine 0.6 MG tablet Take 0.6 mg by mouth 2 (two) times daily as needed.   lisinopril 40 MG tablet Commonly known as: ZESTRIL Take 40 mg by mouth daily.   senna-docusate 8.6-50 MG tablet Commonly known as: Senokot-S Take 1 tablet by mouth at bedtime as needed for moderate constipation.   tamsulosin 0.4 MG Caps capsule Commonly known as: FLOMAX Take 0.4 mg by mouth  daily.        Follow-up Information     End, Harrell Gave, MD. Go on 07/02/2020.   Specialty: Cardiology Why: heart care @ 2:00pm Contact information: [address] [address] [phone]         [name], MD. Schedule an appointment as soon as possible for a visit in 3 week(s).   Specialty: Internal Medicine Contact information: [id number] [place] [address] [phone]                  Time coordinating discharge: 39 minutes  Signed:  Chaniya Genter  [facility] 06/27/2020, 11:29 AM

## 2020-06-27 NOTE — Progress Notes (Signed)
Andres White, OT  Rehab Admission Coordinator  Physical Medicine and Rehabilitation  PMR Pre-admission      Signed  Date of Service:  06/25/2020  3:43 PM      Related encounter: ED to Hosp-Admission (Discharged) from 06/24/2020 in [place] (1C)      Signed       PMR Admission Coordinator Pre-Admission Assessment   Patient: Andres White is an 74 y.o., male MRN: [id number] DOB: 11/13/1945 Height: _0  (170.2 cm) Weight: 74.4 kg                                                                                                                                                  Insurance Information HMO: yes    PPO:      PCP:      IPA:      80/20:      OTHER:  PRIMARY: [employer] Medicare      Policy#: [id number]      Subscriber: patient CM Name: Andres White      Phone#: [phone]     Fax#: [phone] Pre-Cert#: [id number]      Employer: [employer] provided by Andres White with Andres White for admit to CIR. Andres White [id number] with [employer] # [id number]. Pt is approved for 7 days with start date 11/24-11/30. Clinical updates due to (f): [phone]  Benefits:  Phone #: online     Name: [email or web address] Eff. Date: 08/05/2019     Deduct: $0 (does not have deductible)      Out of Pocket Max: $3,600 ($0 met)      Life Max:   CIR: $295/day co-pay for days 1-5, $0/day co-pay for days 6+      SNF: $0/day co-pay for days 1-20, $184/day co-pay for days 21-40, $0/day co-pay for days 41-100; limited to 100 days/cal yr Outpatient: $30/visit co-pay; limited by medical necessity Home Health: 100% coverage, 0% co-insurance; limited by medical necessity      DME: 80% coverage, 20% co-insurance     Providers:  SECONDARY: None      Policy#:       Phone#:    Development worker, community:       Phone#:    [employer], [employer]" for patients in Inpatient Rehabilitation Facilities with attached "Privacy Act [place] Records" was provided and verbally reviewed with: Patient and  Family   Emergency Contact Information         Contact Information     Name Relation Home Work Mobile    Andres White Daughter [phone]   [phone]    Andres White [phone]   [phone]       Current Medical History  Patient Admitting Diagnosis: 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 male with PMH of HTN, arthritis, tobacco use,  who presented with right leg weakness, L facial weakness, and speech changes. The patient was admitted to [facility] on 112121 with complaints of weakness in his RLE that had been ongoing for approximately 4 days; a family friend also reports he has had a change in his speech. 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 Physical Medicine & Rehabilitation was consulted to assess candidacy for CIR. Recommendation is for CIR at this time and pt is to admit to CIR on 06/27/20.    Complete NIHSS TOTAL: 2   Past Medical History      Past Medical History:  Diagnosis Date  . Arthritis    . Chronic back pain    . Gout    . Hypertension    . Syncope and collapse        Family History  family history includes Hypertension in his mother.   Prior Rehab/Hospitalizations:  Has the patient had prior rehab or hospitalizations prior to admission? No   Has the patient had major surgery during 100 days prior to admission? No   Current Medications    Current Facility-Administered Medications:  .  acetaminophen (TYLENOL) tablet 650 mg, 650 mg, Oral, Q4H PRN **OR** acetaminophen (TYLENOL) 160 MG/5ML solution 650 mg, 650 mg, Per Tube, Q4H PRN **OR** acetaminophen (TYLENOL) suppository 650 mg, 650 mg, Rectal, Q4H PRN, Cruzita Lederer, Costin M, MD .  amLODipine (NORVASC) tablet 10 mg, 10 mg, Oral, Daily, Lorella Nimrod, MD, 10 mg at 06/26/20  0736 .  aspirin EC tablet 81 mg, 81 mg, Oral, Daily, Caren Griffins, MD, 81 mg at 06/27/20 0735 .  atorvastatin (LIPITOR) tablet 40 mg, 40 mg, Oral, Daily, Lorella Nimrod, MD, 40 mg at 06/27/20 0735 .  citalopram (CELEXA) tablet 20 mg, 20 mg, Oral, Daily, Caren Griffins, MD, 20 mg at 06/27/20 0735 .  enoxaparin (LOVENOX) injection 40 mg, 40 mg, Subcutaneous, Q24H, Caren Griffins, MD, 40 mg at 06/26/20 2109 .  hydrALAZINE (APRESOLINE) injection 10 mg, 10 mg, Intravenous, Q6H PRN, Sharion Settler, NP, 10 mg at 06/25/20 6222 .  lisinopril (ZESTRIL) tablet 40 mg, 40 mg, Oral, Daily, Lorella Nimrod, MD, 40 mg at 06/26/20 0737 .  senna-docusate (Senokot-S) tablet 1 tablet, 1 tablet, Oral, QHS PRN, Caren Griffins, MD .  tamsulosin (FLOMAX) capsule 0.4 mg, 0.4 mg, Oral, Daily, Lorella Nimrod, MD, 0.4 mg at 06/27/20 0735   Patients Current Diet:     Diet Order                      Diet heart healthy/carb modified Room service appropriate? Yes; Fluid consistency: Thin  Diet effective now                      Precautions / Restrictions Precautions Precautions: Fall Precaution Comments: watch HR Restrictions Weight Bearing Restrictions: No    Has the patient had 2 or more falls or a fall with injury in the past year?Yes   Prior Activity Level Community (5-7x/wk): active, retired, cut out driving recently. Independent without AD PTA   Prior Functional Level Prior Function Level of Independence: Independent   Self Care: Did the patient need help bathing, dressing, using the toilet or eating?  Independent   Indoor Mobility: Did the patient need assistance with walking from room to room (with or without device)? Independent  Stairs: Did the patient need assistance with internal or external stairs (with or without device)? Independent   Functional Cognition: Did the patient need help planning regular tasks such as shopping or remembering to take medications? Independent    Home Assistive Devices / Equipment Home Assistive Devices/Equipment: None Home Equipment: Cane - single point   Prior Device Use: Indicate devices/aids used by the patient prior to current illness, exacerbation or injury? None of the above   Current Functional Level Cognition   Arousal/Alertness: Awake/alert Overall Cognitive Status: Impaired/Different from baseline Orientation Level: Oriented to person, Oriented to place, Oriented to situation, Disoriented to time Following Commands: Follows one step commands consistently, Follows multi-step commands inconsistently Safety/Judgement: Decreased awareness of safety, Decreased awareness of deficits Attention: Focused Focused Attention: Appears intact Memory: Impaired Memory Impairment: Decreased recall of new information, Decreased short term memory Awareness: Appears intact Executive Function: Sequencing Sequencing: Impaired    Extremity Assessment (includes Sensation/Coordination)   Upper Extremity Assessment: RUE deficits/detail, LUE deficits/detail RUE Deficits / Details: R shoulder strength grossly 4/5, elbow and grip strength symmetrical to L RUE Sensation: WNL RUE Coordination: decreased fine motor, decreased gross motor LUE Deficits / Details: WFLs LUE Sensation: WNL LUE Coordination: WNL  Lower Extremity Assessment: Defer to PT evaluation, RLE deficits/detail, LLE deficits/detail RLE Deficits / Details: hip flexion 4+/5, hip abduction/adduction 4+5, knee extension 4+/5 just extended time needed for TKE. ankle DF 2+/5, PF 3+/5 RLE Sensation: WNL RLE Coordination: decreased gross motor, decreased fine motor LLE Deficits / Details: WFLs LLE Sensation: WNL LLE Coordination: WNL     ADLs   Overall ADL's : Needs assistance/impaired General ADL Comments: MIN/MOD A with seated LB dressing, increased RR/decreased O2 with attempts to fold at the waist in sitting for LB ADLs. SETUP for seated UB ADLs d/t decreased R UE  coordination. Requires MIN A/CGA for ADL transfers/fxl mobility with RW as he needs B UE support.     Mobility   Overal bed mobility: Needs Assistance Bed Mobility: Supine to Sit Supine to sit: Supervision General bed mobility comments: pt up to chair with PT when OT presents.     Transfers   Overall transfer level: Needs assistance Equipment used: Rolling walker (2 wheeled) Transfers: Sit to/from Stand Sit to Stand: Min assist, Min guard General transfer comment: Verbal cues for hand placement and immediate standing balance intermittently.     Ambulation / Gait / Stairs / Wheelchair Mobility   Ambulation/Gait Ambulation/Gait assistance: Herbalist (Feet): 60 Feet (60x2.) Assistive device: Rolling walker (2 wheeled) (use of railing in hallway on L) Gait Pattern/deviations: Decreased dorsiflexion - right General Gait Details: Pt presents with narrow BOS. Use of Acewrap for dorsiflexion assist. PT presenting with difficulty to slow or stop gait. Gait velocity: decreased Gait velocity interpretation: <1.31 ft/sec, indicative of household ambulator     Posture / Balance Balance Overall balance assessment: Needs assistance Sitting-balance support: Feet supported Sitting balance-Leahy Scale: Good Standing balance support: Bilateral upper extremity supported Standing balance-Leahy Scale: Fair Standing balance comment: initially attempted with cane as pt with cane at home, but pt unable to steady himself with only 1 UE support, requires B UE support with RW to successfully perform fxl mobility.     Special needs/care consideration NA        Previous Home Environment (from acute therapy documentation) Living Arrangements: Alone Available Help at Discharge: Family, Available PRN/intermittently Type of Home: House Home Layout: One level Home Access: Stairs to enter [employer] of Steps: 2  Bathroom Shower/Tub: Development worker, community: Yes Home Care Services: No Additional Comments: Pt reported 5 falls in the last 6 months, reported that it happens when he goes to stand up but does not feel light-headed, just unsteady   Discharge Living Setting Plans for Discharge Living Setting: Other (Comment) (will go stay with daugther at her house in [place]) Type of Home at Discharge: House Discharge Home Layout: One level Discharge Home Access: Stairs to enter Entrance Stairs-Rails: None Entrance Stairs-Number of Steps: 1 Discharge Bathroom Shower/Tub: Tub/shower unit Discharge Bathroom Toilet: Standard Discharge Bathroom Accessibility: Yes How Accessible: Accessible via walker Does the patient have any problems obtaining your medications?: No   Social/Family/Support Systems Patient Roles: Other (Comment) (retired; 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 Information: daughter: [name]: [phone] Anticipated Caregiver: daughter Anticipated Caregiver's Contact Information: see above Ability/Limitations of Caregiver: supervision Caregiver Availability: 24/7 Discharge Plan Discussed with Primary Caregiver: Yes Is Caregiver In Agreement with Plan?: Yes Does Caregiver/Family have Issues with Lodging/Transportation while Pt is in Rehab?: No     Goals Patient/Family Goal for Rehab: PT/OT/SLP: Supervision Expected length of stay: 10-14 days Pt/Family Agrees to Admission and willing to participate: Yes Program Orientation Provided & Reviewed with Pt/Caregiver Including Roles  & Responsibilities: Yes (pt and his daughter)  Barriers to Discharge: Home environment access/layout  Barriers to Discharge Comments: one step to enter daughter's home (Immediate DC location)     Decrease burden of Care through IP rehab admission: OtherNA     Possible need for SNF placement upon discharge:Not anticipated; this patient has good support from his daughter who can provide 24/7 Supervision at DC. They are aware that  the plan is to DC home from CIR.      Patient Condition: This patient's condition remains as documented in the consult dated 06/25/20, in which the Rehabilitation Physician determined and documented that the patient's condition is appropriate for intensive rehabilitative care in an inpatient rehabilitation facility. Will admit to inpatient rehab today, 06/27/20.   Preadmission Screen Completed By:  Andres White, OT, 06/27/2020 11:04 AM ______________________________________________________________________   Discussed status with Dr. Ranell Patrick on 11/24/21at 11:04AM and received approval for admission today.   Admission Coordinator:  Andres White, time 11:04AM/Date 06/27/20             Cosigned by: Izora Ribas, MD at 06/27/2020 11:08 AM  Revision History Note Details  Author Andres White, OT File Time 06/27/2020 11:04 AM  Author Type Rehab Admission Coordinator Status Signed  Last Editor Andres White, [facility] [place] # [id number] Admit Date 06/27/2020

## 2020-06-27 NOTE — Progress Notes (Signed)
Inpatient Rehabilitation Medication Review by a Pharmacist  A complete drug regimen review was completed for this patient to identify any potential clinically significant medication issues.  Clinically significant medication issues were identified:  no  Check AMION for pharmacist assigned to patient if future medication questions/issues arise during this admission.  Pharmacist comments:   Time spent performing this drug regimen review (minutes):  10 min   Arrie Senate, PharmD, BCPS, [facility] Clinical Pharmacist Please check AMION for all [place] numbers 06/27/2020

## 2020-06-27 NOTE — Progress Notes (Signed)
Physical Therapy Treatment Patient Details Name: Andres White MRN: [id number] DOB: 1945/11/07 Today's Date: 06/27/2020    History of Present Illness Pt is a 74 y.o. male with medical history significant of hypertension, arthritis, tobacco use, comes to the hospital with complaints of right leg weakness, L facial weakness noted and roommate mentioned changes in speech.[name] MRI+ for small acute infarcts in the left midbrain as well as several chronic infarcts including involvement of the left frontal and parietal lobes, right parietotemporal lobe, bilateral thalamus, and bilateral cerebellum.    PT Comments    The pt presents in great spirits today. He is able to correctly recall proper techniques for sit<>stand transfer to RW without cueing. It is important to note although he correctly recalled the technique he did not demonstrate use of proper technique without verbal and tactile cues for safety. Prior to admission the pt was ambulatory without the use of an AD. At this time he requires Min-Mod A for gait without an AD for safety and balance. PT to continue POC.     Follow Up Recommendations  CIR     Equipment Recommendations  Other (comment)    Recommendations for Other Services       Precautions / Restrictions Precautions Precautions: Fall Precaution Comments: watch HR Restrictions Weight Bearing Restrictions: No    Mobility  Bed Mobility Overal bed mobility: Modified Independent Bed Mobility: Supine to Sit     Supine to sit: Modified independent (Device/Increase time)        Transfers Overall transfer level: Needs assistance Equipment used: Rolling walker (2 wheeled);None (sit<>stand transfer with RW and without AD.) Transfers: Sit to/from Stand Sit to Stand: Min guard         General transfer comment: Verbal cues for hand placement and immediate standing balance intermittently.  Ambulation/Gait Ambulation/Gait assistance: Min assist;Mod assist Gait Distance  (Feet): 30 Feet Assistive device: None Gait Pattern/deviations: Decreased dorsiflexion - right;Narrow base of support;Trunk flexed Gait velocity: decreased       Stairs             Wheelchair Mobility    Modified Rankin (Stroke Patients Only)       Balance Overall balance assessment: Mild deficits observed, not formally tested Sitting-balance support: Feet unsupported Sitting balance-Leahy Scale: Good     Standing balance support: No upper extremity supported Standing balance-Leahy Scale: Fair                              Cognition Arousal/Alertness: Awake/alert Behavior During Therapy: WFL for tasks assessed/performed Overall Cognitive Status: Impaired/Different from baseline Area of Impairment: Problem solving                 Orientation Level: Disoriented to;Time                    Exercises Other Exercises Other Exercises: 5xSTS 32.88; pt requiring cues for completion of tasks.    General Comments        Pertinent Vitals/Pain Pain Assessment: No/denies pain    Home Living                      Prior Function            PT Goals (current goals can now be found in the care plan section) Acute Rehab PT Goals Patient Stated Goal: to return to PLOF PT Goal Formulation: With patient Time For Goal Achievement: 07/09/20  Potential to Achieve Goals: Good Progress towards PT goals: Progressing toward goals    Frequency    7X/week      PT Plan Current plan remains appropriate    Co-evaluation     PT goals addressed during session: Balance;Proper use of DME;Mobility/safety with mobility        AM-PAC PT "6 Clicks" Mobility   Outcome Measure  Help needed turning from your back to your side while in a flat bed without using bedrails?: None Help needed moving from lying on your back to sitting on the side of a flat bed without using bedrails?: None Help needed moving to and from a bed to a chair (including  a wheelchair)?: A Little Help needed standing up from a chair using your arms (e.g., wheelchair or bedside chair)?: A Little Help needed to walk in hospital room?: A Little Help needed climbing 3-5 steps with a railing? : A Lot 6 Click Score: 19    End of Session Equipment Utilized During Treatment: Gait belt Activity Tolerance: Patient tolerated treatment well Patient left: in chair;with bed alarm set;with call bell/phone within reach Nurse Communication: Mobility status PT Visit Diagnosis: Other abnormalities of gait and mobility (R26.89);Muscle weakness (generalized) (M62.81);Difficulty in walking, not elsewhere classified (R26.2);Unsteadiness on feet (R26.81);History of falling (Z91.81)     Time: 0981-1914 PT Time Calculation (min) (ACUTE ONLY): 26 min  Charges:  $Gait Training: 8-22 mins $Therapeutic Exercise: 8-22 mins                     11:42 AM, 06/27/20 Alistair Senft A. Saverio Danker PT, DPT Physical Therapist - [facility]  Torra Pala A Diezel Mazur 06/27/2020, 11:39 AM
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
## 2020-06-27 NOTE — IPOC Note (Signed)
Individualized overall Plan of Care [facility]) Patient Details Name: Andres White MRN: [id number] DOB: August 08, 1945  Admitting Diagnosis: CVA (cerebral vascular accident) [facility])  Hospital Problems: Principal Problem:   CVA (cerebral vascular accident) [facility]) Active Problems:   Hypertension   Faintness   Chest discomfort   Left carotid stenosis   Right leg weakness   Stroke (cerebrum) (HCC)   Brainstem infarct, acute (HCC)   Acute blood loss anemia   AKI (acute kidney injury) ([place])   Tachycardia   Dysesthesia   Neuropathic pain     Functional Problem List: Nursing Bladder, Bowel, Endurance, Nutrition, Pain, Safety, Perception  PT Balance, Behavior, Motor, Pain, Perception, Safety  OT Balance, Cognition, Endurance, Motor, Pain, Perception, Sensory  SLP Cognition  TR         Basic ADL's: OT Eating, Grooming, Bathing, Dressing, Toileting     Advanced  ADL's: OT       Transfers: PT Bed Mobility, Bed to Chair, Teacher, early years/pre, Tub/Shower     Locomotion: PT Ambulation, Stairs     Additional Impairments: OT Fuctional Use of Upper Extremity  SLP Social Cognition   Problem Solving, Memory, Awareness  TR      Anticipated Outcomes Item Anticipated Outcome  Self Feeding Set-up A  Swallowing      Basic self-care  Supervision/CGA  Toileting  Supervision/CGA   Bathroom Transfers Supervision/CGA  Bowel/Bladder  remain continent and maintain regular pattern of emtpying  Transfers  Supervision  Locomotion  Supervision  Communication     Cognition  Supervision A  Pain  less than 3  Safety/Judgment  remain free of falls, skin breakdown, infection   Therapy Plan: PT Intensity: Minimum of 1-2 x/day ,45 to 90 minutes PT Frequency: 5 out of 7 days PT Duration Estimated Length of Stay: ~3 weeks OT Intensity: Minimum of 1-2 x/day, 45 to 90 minutes OT Frequency: 5 out of 7 days OT Duration/Estimated Length of Stay: 17-21 days SLP Intensity: Minumum of 1-2 x/day, 30  to 90 minutes SLP Frequency: 3 to 5 out of 7 days SLP Duration/Estimated Length of Stay: 17-21 days    Team Interventions: Nursing Interventions Patient/Family Education, Bowel Management, Disease Management/Prevention, Medication Management, Pain Management, Discharge Planning, Psychosocial Support  PT interventions Ambulation/gait training, Cognitive remediation/compensation, Discharge planning, DME/adaptive equipment instruction, Functional mobility training, Pain management, Psychosocial support, Splinting/orthotics, Therapeutic Activities, UE/LE Strength taining/ROM, Training and development officer, Community reintegration, Disease management/prevention, Functional electrical stimulation, Neuromuscular re-education, Patient/family education, Skin care/wound management, Stair training, Therapeutic Exercise, UE/LE Coordination activities, Wheelchair propulsion/positioning  OT Interventions Training and development officer, Cognitive remediation/compensation, Academic librarian, Discharge planning, Disease mangement/prevention, Engineer, drilling, Functional electrical stimulation, Functional mobility training, Neuromuscular re-education, Pain management, Patient/family education, Psychosocial support, Self Care/advanced ADL retraining, Splinting/orthotics, Therapeutic Activities, Therapeutic Exercise, UE/LE Strength taining/ROM, UE/LE Coordination activities, Wheelchair propulsion/positioning  SLP Interventions Cognitive remediation/compensation, Cueing hierarchy, Functional tasks, Patient/family education, Therapeutic Activities, Internal/external aids  TR Interventions    SW/CM Interventions Discharge Planning, Psychosocial Support, Patient/Family Education   Barriers to Discharge MD  Medical stability and cognition, pain.  Nursing      PT Decreased caregiver support, Incontinence    OT      SLP      SW       Team Discharge Planning: Destination: PT-Home ,OT- Home ,  SLP-Home Projected Follow-up: PT-Home health PT, 24 hour supervision/assistance, OT-  Home health OT, SLP-24 hour supervision/assistance, Home Health SLP Projected Equipment Needs: PT-To be determined, OT- To be determined, SLP-None recommended by SLP Equipment  Details: PT- , OT-  Patient/family involved in discharge planning: PT- Patient unable/family or caregiver not available,  OT-Patient, SLP-Patient  MD ELOS: 18-22 days. Medical Rehab Prognosis:  Good Assessment: 74 y.o. male with PMH of HTN, arthritis, tobacco use, who presented to [facility] on 06/24/20 with 4 day history of right leg weakness, L facial weakness, and speech changes. 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  Carotid Dopplers done showing 70 to 99% stenosis of left ICA/CCA as well as moderate heterogenous partially calcified plaque at right carotid bifurcation.  2D echo showed EF 60 to 65% with no wall abnormality and severe concentric left ventricular hypertrophy. Neurology recommended ASA and statin for secondary stroke prevention. He had bouts of asymptomatic nonsustained episode of V. tach with activity and cardiac monitor was given with recommendations to follow-up with cardiology on outpatient basis. Speech therapy evaluation revealed mild to moderate cognitive linguistic deficits with mild dysarthria.  Patient with resultant right-sided weakness with flexed posture, disorientation with impaired problem-solving and requires cues to complete task.    We will set goals for supervision with PT/OT/SLP.  Due to the current state of emergency, patients may not be receiving their 3-hours of Medicare-mandated therapy.  See Team Conference Notes for weekly updates to the plan of care

## 2020-06-27 NOTE — TOC Transition Note (Signed)
Transition of Care [facility]) - CM/SW Discharge Note   Patient Details  Name: Andres White MRN: [id number] Date of Birth: 30-Jun-1946  [facility]) CM/SW Contact:  Magnus Ivan, LCSW Phone Number: 06/27/2020, 11:09 AM   Clinical Narrative:   CSW was informed by CIR Representative Claiborne Billings that they have authorization and will take patient today. She will arrange [facility] transport and update patient and family. CSW asked Claiborne Billings to inform CIR if any needs for CSW.    Final next level of care: IP Rehab Facility Barriers to Discharge: Continued Medical Work up   Patient Goals and CMS Choice Patient states their goals for this hospitalization and ongoing recovery are:: inpatient rehab CMS Medicare.gov Compare Post Acute Care list provided to:: Patient Choice offered to / list presented to : Patient, Adult Children  Discharge Placement              Patient chooses bed at:  [facility]) Patient to be transferred to facility by: [facility]- arranged by CIR Representative Name of family member notified: CIR Representative updated patient and daughter Patient and family notified of of transfer: 06/27/20  Discharge Plan and Services   Discharge Planning Services: CM Consult Post Acute Care Choice: IP Rehab            DME Agency: NA       HH Arranged: NA          Social Determinants of Health (SDOH) Interventions     Readmission Risk Interventions No flowsheet data found.

## 2020-06-27 NOTE — Progress Notes (Signed)
Patient OOF via EMS in stable condition.  °

## 2020-06-27 NOTE — Progress Notes (Signed)
Izora Ribas, MD  Physician  Physical Medicine and Rehabilitation  Consult Note      Signed  Date of Service:  06/25/2020  1:49 PM      Related encounter: ED to Hosp-Admission (Discharged) from 06/24/2020 in [place] (1C)      Signed      Expand All Collapse All           Physical Medicine and Rehabilitation Consult Reason for Consult: Impaired mobility and ADLs following multiple small acute strokes Referring Physician: Lorella Nimrod, MD     HPI: Andres White is a 74 y.o. male with PMH of HTN, arthritis, tobacco use, who presented with right leg weakness, L facial weakness, and speech changes. 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 Physical Medicine & Rehabilitation was consulted to assess candidacy for CIR.    Review of Systems  Constitutional: Negative.   HENT: Negative.   Eyes: Negative.   Respiratory: Negative.   Cardiovascular: Negative.   Gastrointestinal: Negative.   Genitourinary: Negative.   Musculoskeletal: Negative.   Skin: Negative.   Neurological: Positive for speech change and focal weakness.  Endo/Heme/Allergies: Negative.   Psychiatric/Behavioral: Negative.         Past Medical History:  Diagnosis Date  . Arthritis    . Chronic back pain    . Gout    . Hypertension    . Syncope and collapse           Past Surgical History:  Procedure Laterality Date  . CARDIAC CATHETERIZATION   2015    [facility]   . KNEE SURGERY        Family History  Problem Relation Age of Onset  . Hypertension Mother      Social History:  reports that he has been smoking cigarettes. He has smoked for the past 20.00 years. He has never used smokeless tobacco. He reports current alcohol use. He reports that he does not use drugs. Allergies: No Known Allergies        Medications Prior to Admission  Medication Sig Dispense Refill  . amLODipine (NORVASC) 10 MG tablet Take 10 mg by mouth daily.      [name] aspirin EC 81 MG tablet Take 1 tablet (81 mg total) by mouth daily. 90 tablet 3  . atorvastatin (LIPITOR) 20 MG tablet Take 20 mg by mouth daily.      . citalopram (CELEXA) 20 MG tablet Take 20 mg by mouth daily.      [name] ibuprofen (ADVIL,MOTRIN) 800 MG tablet Take 800 mg by mouth 3 (three) times daily as needed.      [name] lisinopril (PRINIVIL,ZESTRIL) 40 MG tablet Take 40 mg by mouth daily.      . tamsulosin (FLOMAX) 0.4 MG CAPS capsule Take 0.4 mg by mouth daily.      . colchicine 0.6 MG tablet Take 0.6 mg by mouth 2 (two) times daily as needed.          Home: Home Living Family/patient expects to be discharged to:: Private residence Living Arrangements: Alone Available Help at Discharge: Family, Available PRN/intermittently Type of Home: House Home Access: Stairs to enter [employer] of Steps: 2 [place]: One level Bathroom Shower/Tub: Chiropodist: Standard Bathroom Accessibility: Yes Home Equipment: [place] - single point Additional Comments: Pt reported 5 falls in the  last 6 months, reported that it happens when he goes to stand up but does not feel light-headed, just unsteady  Functional History: Prior Function Level of Independence: Independent Functional Status:  Mobility: Bed Mobility Overal bed mobility: Needs Assistance Bed Mobility: Supine to Sit Supine to sit: Supervision, HOB elevated General bed mobility comments: pt up to chair with PT when OT presents. Transfers Overall transfer level: Needs assistance Equipment used: Rolling walker (2 wheeled), Quad cane Transfers: Sit to/from Stand Sit to Stand: Min assist, Min guard General transfer comment: Pt with decreased steadiness requiring B UE support and MIN A/CGA to steady once in standing and with fxl mobility. Ambulation/Gait Ambulation/Gait  assistance: Mod assist, Min assist Gait Distance (Feet):  (23ft, seated rest break, additional 21ft with RW, seated rest break ~75ft with quad cane.) Assistive device: Rolling walker (2 wheeled), Quad cane Gait Pattern/deviations: Drifts right/left, Trunk flexed, Narrow base of support, Step-through pattern General Gait Details: Pt exhibited difficulty with proprioception of RLE in space, narrow BOS noted with staggering/drifitng with ambulation. Pt attempted with quad canem modA for safety, significant staggering noted when attempting to terminate ambulation. Gait velocity: decreased   ADL: ADL Overall ADL's : Needs assistance/impaired General ADL Comments: MIN/MOD A with seated LB dressing, increased RR/decreased O2 with attempts to fold at the waist in sitting for LB ADLs. SETUP for seated UB ADLs d/t decreased R UE coordination. Requires MIN A/CGA for ADL transfers/fxl mobility with RW as he needs B UE support.   Cognition: Cognition Overall Cognitive Status: Impaired/Different from baseline Arousal/Alertness: Awake/alert Orientation Level: Oriented to person, Oriented to place Attention: Focused Focused Attention: Appears intact Memory: Impaired Memory Impairment: Decreased recall of new information, Decreased short term memory Immediate Memory Recall: Sock, Oren Binet, Bed Memory Recall Sock: With Cue Memory Recall Blue: With Cue Memory Recall Bed: With Cue Awareness: Appears intact Executive Function: Sequencing Sequencing: Impaired Cognition Arousal/Alertness: Awake/alert Behavior During Therapy: WFL for tasks assessed/performed Overall Cognitive Status: Impaired/Different from baseline Area of Impairment: Orientation, Memory, Following commands, Problem solving Orientation Level: Disoriented to, Time Memory: Decreased short-term memory Following Commands: Follows one step commands with increased time Problem Solving: Slow processing, Requires verbal cues, Requires tactile cues,  Decreased initiation   Blood pressure (!) 152/80, pulse 87, temperature 97.8 F (36.6 C), temperature source Oral, resp. rate 17, height 5\' 7"  (1.702 m), weight 74.4 kg, SpO2 98 %. Physical Exam  General:  No apparent distress, pleasant HEENT: Head is normocephalic, atraumatic, PERRLA, EOMI, sclera anicteric, oral mucosa pink and moist, dentition intact, ext ear canals clear,  Neck: Supple without JVD or lymphadenopathy Heart: Reg rate and rhythm. No murmurs rubs or gallops Chest: CTA bilaterally without wheezes, rales, or rhonchi; no distress Abdomen: Soft, non-tender, non-distended, bowel sounds positive. Extremities: No clubbing, cyanosis, or edema. Pulses are 2+ Skin: Clean and intact without signs of breakdown Neuro: Alert and oriented x1 (person). Mild expressive aphasia and dysarthria. Requires cues for immediate recall 3/3. Cranial nerves 2-12 are intact except for R facial droop. Sensory exam is normal. Reflexes are 2+ in all 4's. Fine motor coordination is intact. No tremors. Motor function is grossly 5/5 except for 4/5 RLE.   Psych: Pt's affect is appropriate. Pt is cooperative   Lab Results Last 24 Hours       Results for orders placed or performed during the hospital encounter of 06/24/20 (from the past 24 hour(s))  Troponin I (High Sensitivity)     Status: None    Collection Time: 06/24/20  2:11 PM  Result Value Ref Range    Troponin I (High Sensitivity) 16 <18 ng/L  Protime-INR     Status: None    Collection Time: 06/24/20  4:36 PM  Result Value Ref Range    Prothrombin Time 13.7 11.4 - 15.2 seconds    INR 1.1 0.8 - 1.2  APTT     Status: None    Collection Time: 06/24/20  4:36 PM  Result Value Ref Range    aPTT 28 24 - 36 seconds  Resp Panel by RT-PCR (Flu A&B, Covid) Nasopharyngeal Swab     Status: None    Collection Time: 06/24/20  4:37 PM    Specimen: Nasopharyngeal Swab; Nasopharyngeal(NP) swabs in vial transport medium  Result Value Ref Range    SARS  Coronavirus 2 by RT PCR NEGATIVE NEGATIVE    Influenza A by PCR NEGATIVE NEGATIVE    Influenza B by PCR NEGATIVE NEGATIVE  Hemoglobin A1c     Status: None    Collection Time: 06/25/20  4:41 AM  Result Value Ref Range    Hgb A1c MFr Bld 4.8 4.8 - 5.6 %    Mean Plasma Glucose 91.06 mg/dL  Lipid panel     Status: None    Collection Time: 06/25/20  4:41 AM  Result Value Ref Range    Cholesterol 96 0 - 200 mg/dL    Triglycerides 87 <150 mg/dL    HDL 47 >40 mg/dL    Total CHOL/HDL Ratio 2.0 RATIO    VLDL 17 0 - 40 mg/dL    LDL Cholesterol 32 0 - 99 mg/dL       Imaging Results (Last 48 hours)  CT Head Wo Contrast   Result Date: 06/24/2020 CLINICAL DATA:  Right leg pain EXAM: CT HEAD WITHOUT CONTRAST TECHNIQUE: Contiguous axial images were obtained from the base of the skull through the vertex without intravenous contrast. COMPARISON:  None. FINDINGS: Brain: There is no acute intracranial hemorrhage, mass effect, or edema. No definite acute appearing loss of gray-white differentiation. There are multiple chronic infarcts including involvement of the left frontal lobe, right parietotemporal lobe, and bilateral cerebellar hemispheres. Age-indeterminate though probably chronic infarct of the left thalamus. Additional patchy and confluent areas of hypoattenuation in the supratentorial white matter are nonspecific but probably reflect moderate chronic microvascular ischemic changes. There is no hydrocephalus.  No extra-axial collection. Vascular: There is atherosclerotic calcification at the skull base. Skull: Calvarium is unremarkable. Sinuses/Orbits: No acute finding. Other: None. IMPRESSION: No acute intracranial hemorrhage or mass effect. Multiple chronic infarcts and moderate chronic microvascular ischemic changes. Age indeterminate though probably chronic infarct of the left thalamus. Electronically Signed   By: Macy Mis M.D.   On: 06/24/2020 13:29    MR BRAIN WO CONTRAST   Result Date:  06/24/2020 CLINICAL DATA:  Right leg pain, cannot bear weight EXAM: MRI HEAD WITHOUT CONTRAST TECHNIQUE: Multiplanar, multiecho pulse sequences of the brain and surrounding structures were obtained without intravenous contrast. COMPARISON:  None. FINDINGS: Brain: There is a small acute infarct of the parasagittal left midbrain. Discontiguous small acute infarct of the left cerebral peduncle. Scattered chronic infarcts including involvement of the left frontal and parietal lobes, right parietotemporal lobe, bilateral thalamus, and bilateral cerebellum. There is minimal susceptibility associated with the right parietotemporal infarct likely reflecting chronic blood products. Additional patchy and confluent areas of T2 hyperintensity in the supratentorial white matter are nonspecific but probably reflect moderate chronic microvascular ischemic changes. There is no intracranial mass or mass  effect. There is no hydrocephalus or extra-axial fluid collection. Vascular: Major vessel flow voids at the skull base are preserved. Skull and upper cervical spine: Normal marrow signal is preserved. Sinuses/Orbits: Paranasal sinuses are aerated. Orbits are unremarkable. Other: Sella is unremarkable.  Mastoid air cells are clear. IMPRESSION: Small acute infarcts of the left midbrain. Several chronic infarcts as described. Moderate chronic microvascular ischemic changes. Electronically Signed   By: Macy Mis M.D.   On: 06/24/2020 16:19    US Carotid Bilateral (at [facility] and [facility] only)   Result Date: 06/25/2020 CLINICAL DATA:  74 year old male with stroke EXAM: BILATERAL CAROTID DUPLEX ULTRASOUND TECHNIQUE: Pearline Cables scale imaging, color Doppler and duplex ultrasound were performed of bilateral carotid and vertebral arteries in the neck. COMPARISON:  None. FINDINGS: Criteria: Quantification of carotid stenosis is based on velocity parameters that correlate the residual internal carotid diameter with NASCET-based stenosis levels,  using the diameter of the distal internal carotid lumen as the denominator for stenosis measurement. The following velocity measurements were obtained: RIGHT ICA:  Systolic 72 cm/sec, Diastolic 17 cm/sec CCA:  90 cm/sec SYSTOLIC ICA/CCA RATIO:  0.8 ECA:  62 cm/sec LEFT ICA:  Systolic 716 cm/sec, Diastolic 38 cm/sec CCA:  72 cm/sec SYSTOLIC ICA/CCA RATIO:  3.1 ECA:  177 cm/sec Right Brachial SBP: Not acquired Left Brachial SBP: Not acquired RIGHT CAROTID ARTERY: Calcification of the right common carotid artery. Intermediate waveform maintained. Moderate heterogeneous and partially calcified plaque at the right carotid bifurcation. No significant lumen shadowing. Low resistance waveform of the right ICA. Tortuosity RIGHT VERTEBRAL ARTERY: Antegrade flow with low resistance waveform. LEFT CAROTID ARTERY: Calcification of the left common carotid artery. Intermediate waveform maintained. Moderate heterogeneous and partially calcified plaque at the left carotid bifurcation. Calcifications present, contributing to shadowing. Low resistance waveform of the left ICA. Tortuosity LEFT VERTEBRAL ARTERY:  Antegrade flow with low resistance waveform. IMPRESSION: Right: Color duplex indicates moderate heterogeneous and calcified plaque, with no hemodynamically significant stenosis by duplex criteria in the extracranial cerebrovascular circulation. Left: Heterogeneous and partially calcified plaque at the left carotid bifurcation, with discordant results regarding degree of stenosis by established duplex criteria. Peak velocity suggests 70% - 99% stenosis, with the ICA/ CCA ratio suggesting a lesser degree of stenosis. If establishing a more accurate degree of stenosis is required, cerebral angiogram should be considered, or as a second best test, CTA. Signed, Dulcy Fanny. Dellia Nims, RPVI [facility] Electronically Signed   By: [name] D.O.   On: 06/25/2020 07:45     ECHOCARDIOGRAM COMPLETE   Result Date: 06/25/2020    ECHOCARDIOGRAM REPORT   Patient Name:   Andres White Date of Exam: 06/25/2020 Medical Rec #:  [id number]      Height:       67.0 in Accession #:    [id number]     Weight:       164.0 lb Date of Birth:  04-29-1946      BSA:          1.859 m Patient Age:    76 years       BP:           158/79 mmHg Patient Gender: M              HR:           85 bpm. Exam Location:  [facility] Procedure: 2D Echo, Cardiac Doppler and Color Doppler Indications:     Stroke 434.91  History:  Patient has no prior history of Echocardiogram examinations.                  Signs/Symptoms:Syncope; Risk Factors:Hypertension.  Sonographer:     Sherrie Sport RDCS (AE) Referring Phys:  [id number] [facility] AMIN Diagnosing Phys: Neoma Laming MD  Sonographer Comments: Technically difficult study due to poor echo windows, no subcostal window and no apical window. IMPRESSIONS  1. Left ventricular ejection fraction, by estimation, is 60 to 65%. The left ventricle has normal function. The left ventricle has no regional wall motion abnormalities. There is severe concentric left ventricular hypertrophy. Left ventricular diastolic  parameters are consistent with Grade I diastolic dysfunction (impaired relaxation).  2. Right ventricular systolic function is normal. The right ventricular size is normal.  3. The mitral valve is normal in structure. Mild mitral valve regurgitation. No evidence of mitral stenosis.  4. The aortic valve is normal in structure. Aortic valve regurgitation is not visualized. No aortic stenosis is present.  5. The inferior vena cava is normal in size with greater than 50% respiratory variability, suggesting right atrial pressure of 3 mmHg. FINDINGS  Left Ventricle: Left ventricular ejection fraction, by estimation, is 60 to 65%. The left ventricle has normal function. The left ventricle has no regional wall motion abnormalities. The left ventricular internal cavity size was normal in  size. There is  severe concentric left ventricular hypertrophy. Left ventricular diastolic parameters are consistent with Grade I diastolic dysfunction (impaired relaxation). Right Ventricle: The right ventricular size is normal. No increase in right ventricular wall thickness. Right ventricular systolic function is normal. Left Atrium: Left atrial size was normal in size. Right Atrium: Right atrial size was normal in size. Pericardium: There is no evidence of pericardial effusion. Mitral Valve: The mitral valve is normal in structure. Mild mitral valve regurgitation. No evidence of mitral valve stenosis. Tricuspid Valve: The tricuspid valve is normal in structure. Tricuspid valve regurgitation is mild . No evidence of tricuspid stenosis. Aortic Valve: The aortic valve is normal in structure. Aortic valve regurgitation is not visualized. No aortic stenosis is present. Pulmonic Valve: The pulmonic valve was normal in structure. Pulmonic valve regurgitation is not visualized. No evidence of pulmonic stenosis. Aorta: The aortic root is normal in size and structure. Venous: The inferior vena cava is normal in size with greater than 50% respiratory variability, suggesting right atrial pressure of 3 mmHg. IAS/Shunts: No atrial level shunt detected by color flow Doppler.  LEFT VENTRICLE PLAX 2D LVIDd:         3.79 cm LVIDs:         2.34 cm LV PW:         1.13 cm LV IVS:        1.34 cm LVOT diam:     2.00 cm LVOT Area:     3.14 cm  LEFT ATRIUM         Index LA diam:    4.40 cm 2.37 cm/m                        PULMONIC VALVE AORTA                 PV Vmax:        0.44 m/s Ao Root diam: 3.10 cm PV Peak grad:   0.8 mmHg                       RVOT Peak grad: 1 mmHg  SHUNTS Systemic Diam: 2.00 cm Neoma Laming MD Electronically signed by Neoma Laming MD Signature Date/Time: 06/25/2020/11:58:34 AM    Final        Assessment/Plan: Diagnosis: Small acute infarct in left midbrain 1. Does the need for close, 24 hr/day medical  supervision in concert with the patient's rehab needs make it unreasonable for this patient to be served in a less intensive setting? Yes 2. Co-Morbidities requiring supervision/potential complications: several chronic infarcts including involvement of the left frontal and parietal lobes, right parietotemporal lobe, bilateral thalamus, and bilateral cerebellum; impaired right sided strength and coordination, disorientation; decreased short term memory; overweight (BMI 25.69), expressive aphasia 3. Due to bladder management, bowel management, safety, skin/wound care, disease management, medication administration, pain management and patient education, does the patient require 24 hr/day rehab nursing? Yes 4. Does the patient require coordinated care of a physician, rehab nurse, therapy disciplines of PT, OT, SLP to address physical and functional deficits in the context of the above medical diagnosis(es)? Yes Addressing deficits in the following areas: balance, endurance, locomotion, strength, transferring, bowel/bladder control, bathing, dressing, feeding, grooming, toileting, cognition, speech, language and psychosocial support 5. Can the patient actively participate in an intensive therapy program of at least 3 hrs of therapy per day at least 5 days per week? Yes 6. The potential for patient to make measurable gains while on inpatient rehab is excellent 7. Anticipated functional outcomes upon discharge from inpatient rehab are S with PT, supervision with OT, supervision with SLP. 8. Estimated rehab length of stay to reach the above functional goals is: 10-14 days 9. Anticipated discharge destination: Home 10. Overall Rehab/Functional Prognosis: excellent   RECOMMENDATIONS: This patient's condition is appropriate for continued rehabilitative care in the following setting: CIR Patient has agreed to participate in recommended program. Yes Note that insurance prior authorization may be required for  reimbursement for recommended care.   Comment: Andres White would be an excellent CIR candidate if he will have 24/7 supervision upon discharge and if his daughter is agreeable to the above length of stay.    Izora Ribas, MD 06/25/2020        Routing History                     Note Details  Author Izora Ribas, MD File Time 06/25/2020  3:04 PM  Author Type Physician Status Signed  Last Editor Izora Ribas, MD Service Physical Medicine and [place] # [phone] Admit Date 06/27/2020

## 2020-06-27 NOTE — Care Management Important Message (Signed)
Important Message  Patient Details  Name: Andres White MRN: [id number] Date of Birth: [birth year]   Medicare Important Message Given:  Yes     [name] 06/27/2020, 11:24 AM

## 2020-06-28 DIAGNOSIS — I10 Essential (primary) hypertension: Secondary | ICD-10-CM

## 2020-06-28 DIAGNOSIS — D62 Acute posthemorrhagic anemia: Secondary | ICD-10-CM | POA: Diagnosis not present

## 2020-06-28 DIAGNOSIS — N179 Acute kidney failure, unspecified: Secondary | ICD-10-CM | POA: Diagnosis not present

## 2020-06-28 DIAGNOSIS — I6389 Other cerebral infarction: Secondary | ICD-10-CM

## 2020-06-28 DIAGNOSIS — R000 Tachycardia, unspecified: Secondary | ICD-10-CM

## 2020-06-28 LAB — CBC WITH DIFFERENTIAL/PLATELET
Abs Immature Granulocytes: 0.02 10*3/uL (ref 0.00–0.07)
Basophils Absolute: 0 10*3/uL (ref 0.0–0.1)
Basophils Relative: 0 %
Eosinophils Absolute: 0.1 10*3/uL (ref 0.0–0.5)
Eosinophils Relative: 1 %
HCT: 39.1 % (ref 39.0–52.0)
Hemoglobin: 12.7 g/dL — ABNORMAL LOW (ref 13.0–17.0)
Immature Granulocytes: 0 %
Lymphocytes Relative: 44 %
Lymphs Abs: 3.9 10*3/uL (ref 0.7–4.0)
MCH: 30.7 pg (ref 26.0–34.0)
MCHC: 32.5 g/dL (ref 30.0–36.0)
MCV: 94.4 fL (ref 80.0–100.0)
Monocytes Absolute: 0.9 10*3/uL (ref 0.1–1.0)
Monocytes Relative: 10 %
Neutro Abs: 4 10*3/uL (ref 1.7–7.7)
Neutrophils Relative %: 45 %
Platelets: 154 10*3/uL (ref 150–400)
RBC: 4.14 MIL/uL — ABNORMAL LOW (ref 4.22–5.81)
RDW: 12.7 % (ref 11.5–15.5)
WBC: 9 10*3/uL (ref 4.0–10.5)
nRBC: 0 % (ref 0.0–0.2)

## 2020-06-28 LAB — COMPREHENSIVE METABOLIC PANEL
ALT: 30 U/L (ref 0–44)
AST: 36 U/L (ref 15–41)
Albumin: 3.3 g/dL — ABNORMAL LOW (ref 3.5–5.0)
Alkaline Phosphatase: 65 U/L (ref 38–126)
Anion gap: 10 (ref 5–15)
BUN: 30 mg/dL — ABNORMAL HIGH (ref 8–23)
CO2: 21 mmol/L — ABNORMAL LOW (ref 22–32)
Calcium: 9.7 mg/dL (ref 8.9–10.3)
Chloride: 106 mmol/L (ref 98–111)
Creatinine, Ser: 1.63 mg/dL — ABNORMAL HIGH (ref 0.61–1.24)
GFR, Estimated: 43 mL/min — ABNORMAL LOW (ref >60)
Glucose, Bld: 105 mg/dL — ABNORMAL HIGH (ref 70–99)
Potassium: 3.8 mmol/L (ref 3.5–5.1)
Sodium: 137 mmol/L (ref 135–145)
Total Bilirubin: 1.2 mg/dL (ref 0.3–1.2)
Total Protein: 7 g/dL (ref 6.5–8.1)

## 2020-06-28 LAB — GLUCOSE, CAPILLARY: Glucose-Capillary: 102 mg/dL — ABNORMAL HIGH (ref 70–99)

## 2020-06-28 NOTE — Progress Notes (Signed)
[place] PHYSICAL MEDICINE & REHABILITATION PROGRESS NOTE  Subjective/Complaints: Patient seen sitting up in bed this AM.  He states he slept well overnight.   ROS: Denies CP, SOB, N/V/D  Objective: Vital Signs: Blood pressure 122/66, pulse (!) 53, temperature 98.8 F (37.1 C), temperature source Oral, resp. rate 18, height 5\' 7"  (1.702 m), weight 83.2 kg, SpO2 98 %. No results found. Recent Labs    06/28/20 0407  WBC 9.0  HGB 12.7*  HCT 39.1  PLT 154   Recent Labs    06/28/20 0407  NA 137  K 3.8  CL 106  CO2 21*  GLUCOSE 105*  BUN 30*  CREATININE 1.63*  CALCIUM 9.7    Intake/Output Summary (Last 24 hours) at 06/28/2020 1205 Last data filed at 06/28/2020 0900 Gross per 24 hour  Intake 537 ml  Output 400 ml  Net 137 ml        Physical Exam: BP 122/66 (BP Location: Left Arm)   Pulse (!) 53   Temp 98.8 F (37.1 C) (Oral)   Resp 18   Ht 5\' 7"  (1.702 m)   Wt 83.2 kg   SpO2 98%   BMI 28.73 kg/m  Constitutional: No distress . Vital signs reviewed. HENT: Normocephalic.  Atraumatic. Eyes: EOMI. No discharge. Cardiovascular: No JVD.  RRR. Respiratory: Normal effort.  No stridor.  Bilateral clear to auscultation. GI: Non-distended.  BS +. Skin: Warm and dry.  Intact. Healed scars.  Psych: Normal mood.  Normal behavior. Musc: No edema in extremities.  No tenderness in extremities. Neuro: Alert Mild expressive aphasia and dysarthria.  Right facial droop.  Motor: LUE/LLE: 5/5 proximal to distal RUE: 4+/5 proximal to distal with ataxia RLE: 2/5 proximal to distal  Assessment/Plan: 1. Functional deficits which require 3+ hours per day of interdisciplinary therapy in a comprehensive inpatient rehab setting.  Physiatrist is providing close team supervision and 24 hour management of active medical problems listed below.  Physiatrist and rehab team continue to assess barriers to discharge/monitor patient progress toward functional and medical goals   Care  Tool:  Bathing              Bathing assist       Upper Body Dressing/Undressing Upper body dressing        Upper body assist      Lower Body Dressing/Undressing Lower body dressing            Lower body assist       Toileting Toileting    Toileting assist       Transfers Chair/bed transfer  Transfers assist           Locomotion Ambulation   Ambulation assist              Walk 10 feet activity   Assist           Walk 50 feet activity   Assist           Walk 150 feet activity   Assist           Walk 10 feet on uneven surface  activity   Assist           Wheelchair     Assist               Wheelchair 50 feet with 2 turns activity    Assist            Wheelchair 150 feet activity     Assist  Medical Problem List and Plan: 1.  Impaired mobility and ADLs secondary to acute CVA with left midbrain infarct  Begin CIR evaluations tomorrow 2.  Antithrombotics: -DVT/anticoagulation:  Pharmaceutical: Lovenox             -antiplatelet therapy: Aspirin 3. Pain Management: Tylenol prn.  4. Mood: Continue Celexa 20mg  daily for depression 5. Neuropsych: This patient is not fully capable of making decisions on his own behalf. 6. Skin/Wound Care: ZioPatch in place, dry skin- added Eucerin cream 7. Fluids/Electrolytes/Nutrition: Monitor I/Os. Encourage fluid intake  8. HTN: Monitor BP   Decrease Amlodipine to 5mg .   Continue Lisinopril 40mg  daily  Monitor with increased mobility 9. Tobacco abuse: Provide counseling  10. Tachycardia/beats of NSVT: Zio patch in place.   Monitor with increased activity 11. AKI  Cr 1.63 on 11/25, labs ordered for tomorrow  Encourage fluids 12. ABLA  Hb 12.7 on 11/25  Cont to monitor  LOS: 1 days A FACE TO FACE EVALUATION WAS PERFORMED  Khalessi Blough Lorie Phenix 06/28/2020, 12:05 PM

## 2020-06-29 ENCOUNTER — Inpatient Hospital Stay (HOSPITAL_COMMUNITY): Payer: Medicare Other | Admitting: Occupational Therapy

## 2020-06-29 ENCOUNTER — Inpatient Hospital Stay (HOSPITAL_COMMUNITY): Payer: Medicare Other | Admitting: Speech Pathology

## 2020-06-29 ENCOUNTER — Inpatient Hospital Stay (HOSPITAL_COMMUNITY): Payer: Medicare Other

## 2020-06-29 DIAGNOSIS — I6389 Other cerebral infarction: Secondary | ICD-10-CM | POA: Diagnosis not present

## 2020-06-29 DIAGNOSIS — D62 Acute posthemorrhagic anemia: Secondary | ICD-10-CM | POA: Diagnosis not present

## 2020-06-29 DIAGNOSIS — I639 Cerebral infarction, unspecified: Secondary | ICD-10-CM | POA: Diagnosis not present

## 2020-06-29 DIAGNOSIS — N179 Acute kidney failure, unspecified: Secondary | ICD-10-CM | POA: Diagnosis not present

## 2020-06-29 DIAGNOSIS — M792 Neuralgia and neuritis, unspecified: Secondary | ICD-10-CM

## 2020-06-29 DIAGNOSIS — R208 Other disturbances of skin sensation: Secondary | ICD-10-CM

## 2020-06-29 LAB — BASIC METABOLIC PANEL
Anion gap: 11 (ref 5–15)
BUN: 22 mg/dL (ref 8–23)
CO2: 20 mmol/L — ABNORMAL LOW (ref 22–32)
Calcium: 9.6 mg/dL (ref 8.9–10.3)
Chloride: 103 mmol/L (ref 98–111)
Creatinine, Ser: 1.25 mg/dL — ABNORMAL HIGH (ref 0.61–1.24)
GFR, Estimated: 60 mL/min — ABNORMAL LOW (ref >60)
Glucose, Bld: 119 mg/dL — ABNORMAL HIGH (ref 70–99)
Potassium: 4.1 mmol/L (ref 3.5–5.1)
Sodium: 134 mmol/L — ABNORMAL LOW (ref 135–145)

## 2020-06-29 MED ORDER — GABAPENTIN 100 MG PO CAPS
100.0000 mg | ORAL_CAPSULE | Freq: Three times a day (TID) | ORAL | Status: DC
  Administered 2020-06-29 – 2020-06-30 (×3): 100 mg via ORAL
  Filled 2020-06-29 (×3): qty 1

## 2020-06-29 MED ORDER — ADULT MULTIVITAMIN W/MINERALS CH
1.0000 | ORAL_TABLET | Freq: Every day | ORAL | Status: DC
  Administered 2020-06-29 – 2020-07-18 (×20): 1 via ORAL
  Filled 2020-06-29 (×20): qty 1

## 2020-06-29 NOTE — Progress Notes (Signed)
[place] PHYSICAL MEDICINE & REHABILITATION PROGRESS NOTE  Subjective/Complaints: Patient seen laying in bed this morning work with therapies.  He states he slept well overnight.  Discussed mobility and significant increase in assistant level with therapies.  Patient complains of generalized joint pain with weightbearing, however,?  Improvement with distraction  ROS: Denies CP, SOB, N/V/D  Objective: Vital Signs: Blood pressure 137/80, pulse 77, temperature 99.4 F (37.4 C), temperature source Oral, resp. rate 16, height 5\' 7"  (1.702 m), weight 83.2 kg, SpO2 98 %. No results found. Recent Labs    06/28/20 0407  WBC 9.0  HGB 12.7*  HCT 39.1  PLT 154   Recent Labs    06/28/20 0407 06/29/20 0441  NA 137 134*  K 3.8 4.1  CL 106 103  CO2 21* 20*  GLUCOSE 105* 119*  BUN 30* 22  CREATININE 1.63* 1.25*  CALCIUM 9.7 9.6    Intake/Output Summary (Last 24 hours) at 06/29/2020 1328 Last data filed at 06/29/2020 0900 Gross per 24 hour  Intake 517 ml  Output --  Net 517 ml        Physical Exam: BP 137/80 (BP Location: Right Arm)    Pulse 77    Temp 99.4 F (37.4 C) (Oral)    Resp 16    Ht 5\' 7"  (1.702 m)    Wt 83.2 kg    SpO2 98%    BMI 28.73 kg/m  Constitutional: No distress . Vital signs reviewed. HENT: Normocephalic.  Atraumatic. Eyes: EOMI. No discharge. Cardiovascular: No JVD.  RRR. Respiratory: Normal effort.  No stridor.  Bilateral clear to auscultation. GI: Non-distended.  BS +. Skin: Warm and dry.  Intact. Psych: Normal mood.  Normal behavior. Musc: No edema in extremities.   Generalized pain in multiple joints Neuro: Alert and oriented x2 Mild expressive aphasia and dysarthria, unchanged.  Right facial droop.  Motor: LUE/LLE: 5/5 proximal to distal, however limited by pain this a.m. RUE: 4+/5 proximal to distal with ataxia RLE: 2/5 proximal to distal  Assessment/Plan: 1. Functional deficits which require 3+ hours per day of interdisciplinary therapy in  a comprehensive inpatient rehab setting.  Physiatrist is providing close team supervision and 24 hour management of active medical problems listed below.  Physiatrist and rehab team continue to assess barriers to discharge/monitor patient progress toward functional and medical goals   Care Tool:  Bathing    Body parts bathed by patient: Abdomen, Chest, Front perineal area, Left upper leg, Right upper leg, Face, Right arm   Body parts bathed by helper: Left arm, Buttocks, Right lower leg, Left lower leg, Face     Bathing assist Assist Level: Maximal Assistance - Patient 24 - 49%     Upper Body Dressing/Undressing Upper body dressing   What is the patient wearing?: Pull over shirt    Upper body assist Assist Level: Maximal Assistance - Patient 25 - 49%    Lower Body Dressing/Undressing Lower body dressing      What is the patient wearing?: Pants, Incontinence brief     Lower body assist Assist for lower body dressing: Total Assistance - Patient < 25%     Toileting Toileting Toileting Activity did not occur Landscape architect and hygiene only): N/A (no void or bm)  Toileting assist       Transfers Chair/bed transfer  Transfers assist  Chair/bed transfer activity did not occur: Safety/medical concerns        Locomotion Ambulation   Ambulation assist  Walk 10 feet activity   Assist           Walk 50 feet activity   Assist           Walk 150 feet activity   Assist           Walk 10 feet on uneven surface  activity   Assist           Wheelchair     Assist               Wheelchair 50 feet with 2 turns activity    Assist            Wheelchair 150 feet activity     Assist           Medical Problem List and Plan: 1.  Impaired mobility and ADLs secondary to acute CVA with left midbrain infarct  Begin CIR evaluations 2.  Antithrombotics: -DVT/anticoagulation:  Pharmaceutical:  Lovenox             -antiplatelet therapy: Aspirin 3. Pain Management: Tylenol prn.   Gabapentin 100 3 times daily started 11/26 for?  Neuropathic pain post stroke 4. Mood: Continue Celexa 20mg  daily for depression 5. Neuropsych: This patient is not fully capable of making decisions on his own behalf. 6. Skin/Wound Care: ZioPatch in place, dry skin- added Eucerin cream 7. Fluids/Electrolytes/Nutrition: Monitor I/Os. Encourage fluid intake  8. HTN: Monitor BP   Decrease Amlodipine to 5mg .   Continue Lisinopril 40mg  daily  Monitor with increased mobility 9. Tobacco abuse: Provide counseling  10. Tachycardia/beats of NSVT: Zio patch in place.   Monitor with increased activity 11. AKI  Creatinine 1.25 on 11/26, labs ordered for Monday  Encourage fluids 12. ABLA  Hb 12.7 on 11/25  Cont to monitor  LOS: 2 days A FACE TO FACE EVALUATION WAS PERFORMED  Andres White Andres White 06/29/2020, 1:28 PM

## 2020-06-29 NOTE — Plan of Care (Signed)
Problem: RH Balance Goal: LTG: Patient will maintain dynamic sitting balance (OT) Description: LTG:  Patient will maintain dynamic sitting balance with assistance during activities of daily living (OT) Flowsheets (Taken 06/29/2020 1446) LTG: Pt will maintain dynamic sitting balance during ADLs with: Supervision/Verbal cueing Goal: LTG Patient will maintain dynamic standing with ADLs (OT) Description: LTG:  Patient will maintain dynamic standing balance with assist during activities of daily living (OT)  Flowsheets (Taken 06/29/2020 1446) LTG: Pt will maintain dynamic standing balance during ADLs with: Supervision/Verbal cueing   Problem: Sit to Stand Goal: LTG:  Patient will perform sit to stand in prep for activites of daily living with assistance level (OT) Description: LTG:  Patient will perform sit to stand in prep for activites of daily living with assistance level (OT) Flowsheets (Taken 06/29/2020 1446) LTG: PT will perform sit to stand in prep for activites of daily living with assistance level: Contact Guard/Touching assist   Problem: RH Eating Goal: LTG Patient will perform eating w/assist, cues/equip (OT) Description: LTG: Patient will perform eating with assist, with/without cues using equipment (OT) Flowsheets (Taken 06/29/2020 1446) LTG: Pt will perform eating with assistance level of: Supervision/Verbal cueing   Problem: RH Grooming Goal: LTG Patient will perform grooming w/assist,cues/equip (OT) Description: LTG: Patient will perform grooming with assist, with/without cues using equipment (OT) Flowsheets (Taken 06/29/2020 1446) LTG: Pt will perform grooming with assistance level of: Supervision/Verbal cueing   Problem: RH Bathing Goal: LTG Patient will bathe all body parts with assist levels (OT) Description: LTG: Patient will bathe all body parts with assist levels (OT) Flowsheets (Taken 06/29/2020 1446) LTG: Pt will perform bathing with assistance level/cueing:  Contact Guard/Touching assist   Problem: RH Dressing Goal: LTG Patient will perform upper body dressing (OT) Description: LTG Patient will perform upper body dressing with assist, with/without cues (OT). Flowsheets (Taken 06/29/2020 1446) LTG: Pt will perform upper body dressing with assistance level of: Supervision/Verbal cueing Goal: LTG Patient will perform lower body dressing w/assist (OT) Description: LTG: Patient will perform lower body dressing with assist, with/without cues in positioning using equipment (OT) Flowsheets (Taken 06/29/2020 1446) LTG: Pt will perform lower body dressing with assistance level of: Contact Guard/Touching assist   Problem: RH Toileting Goal: LTG Patient will perform toileting task (3/3 steps) with assistance level (OT) Description: LTG: Patient will perform toileting task (3/3 steps) with assistance level (OT)  Flowsheets (Taken 06/29/2020 1446) LTG: Pt will perform toileting task (3/3 steps) with assistance level: Contact Guard/Touching assist   Problem: RH Functional Use of Upper Extremity Goal: LTG Patient will use RT/LT upper extremity as a (OT) Description: LTG: Patient will use right/left upper extremity as a stabilizer/gross assist/diminished/nondominant/dominant level with assist, with/without cues during functional activity (OT) Flowsheets (Taken 06/29/2020 1446) LTG: Use of upper extremity in functional activities: RUE as diminished level LTG: Pt will use upper extremity in functional activity with assistance level of: Contact Guard/Touching assist   Problem: RH Toilet Transfers Goal: LTG Patient will perform toilet transfers w/assist (OT) Description: LTG: Patient will perform toilet transfers with assist, with/without cues using equipment (OT) Flowsheets (Taken 06/29/2020 1446) LTG: Pt will perform toilet transfers with assistance level of: Contact Guard/Touching assist   Problem: RH Tub/Shower Transfers Goal: LTG Patient will perform  tub/shower transfers w/assist (OT) Description: LTG: Patient will perform tub/shower transfers with assist, with/without cues using equipment (OT) Flowsheets (Taken 06/29/2020 1446) LTG: Pt will perform tub/shower stall transfers with assistance level of: Contact Guard/Touching assist   Problem: RH Awareness Goal: LTG:  Patient will demonstrate awareness during functional activites type of (OT) Description: LTG: Patient will demonstrate awareness during functional activites type of (OT) Flowsheets (Taken 06/29/2020 1446) LTG: Patient will demonstrate awareness during functional activites type of (OT): Minimal Assistance - Patient > 75%

## 2020-06-29 NOTE — Plan of Care (Signed)
  Problem: Consults Goal: RH STROKE PATIENT EDUCATION Description: See Patient Education module for education specifics  Outcome: Progressing   Problem: RH BOWEL ELIMINATION Goal: RH STG MANAGE BOWEL WITH ASSISTANCE Description: STG Manage Bowel with Assistance. Outcome: Progressing Goal: RH STG MANAGE BOWEL W/MEDICATION W/ASSISTANCE Description: STG Manage Bowel with Medication with Assistance. Min Outcome: Progressing   Problem: RH SAFETY Goal: RH STG ADHERE TO SAFETY PRECAUTIONS W/ASSISTANCE/DEVICE Description: STG Adhere to Safety Precautions With Assistance/Device. min Outcome: Progressing   Problem: RH PAIN MANAGEMENT Goal: RH STG PAIN MANAGED AT OR BELOW PT'S PAIN GOAL Description: Less than 3 Outcome: Progressing

## 2020-06-29 NOTE — Progress Notes (Addendum)
Initial Nutrition Assessment  DOCUMENTATION CODES:   Not applicable  INTERVENTION:   -D/c Ensure Enlive po BID, each supplement provides 350 kcal and 20 grams of protein -Downgrade diet to dysphagia 2 (mechanical soft) for ease of intake, as pt has no teeth -MVI with minerals daily -Magic cup TID with meals, each supplement provides 290 kcal and 9 grams of protein  NUTRITION DIAGNOSIS:   Inadequate oral intake related to  (masticatory difficulty) as evidenced by per patient/family report.  GOAL:   Patient will meet greater than or equal to 90% of their needs  MONITOR:   PO intake, Supplement acceptance, Labs, Weight trends, Skin, I & O's  REASON FOR ASSESSMENT:   Malnutrition Screening Tool    ASSESSMENT:   Andres White is a 74 y.o. male with PMH of HTN, arthritis, tobacco use, who presented to [facility] on [facility]20 with 4 day history of right leg weakness, L facial weakness, and speech changes. 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  Pt admitted to CIR with impaired mobility and ADLs secondary to acute CVA with left midbrain infarct.   Reviewed I/O's: +477 ml x 24 hours and +654 ml since admission  UOP: 400 ml x 24 hours  Spoke with pt at bedside, who was pleasant and in good spirits today. He reports that he generally has a very good appetite. Prior to transfer to CIR, pt reports eating "double portion meals" at [facility]. PTA he typically consumes 2 meals per day, which consist of "mashed meat" and mashed potatoes. Of note, pt with no teeth and endorses difficulty chewing harder foods. He did not eat breakfast today due to difficulty chewing. Noted meal completion 75%.   Pt unsure of UBW. However, he endorses that he lost 50# within the past two weeks. When asked why he may have lost weight, pt replied "I  don't know, it just happened". Per wt hx, UBW around 176-198#. Noted wt gain since CIR transfer.   Discussed with pt importance of good meal and supplement intake to promote healing. Pt amenable to diet downgrade for ease of intake.   Labs reviewed: Na: 134, CBGS: 102-141 (inpatient orders for glycemic control are none).   NUTRITION - FOCUSED PHYSICAL EXAM:    Most Recent Value  Orbital Region No depletion  Upper Arm Region No depletion  Thoracic and Lumbar Region No depletion  Buccal Region No depletion  Temple Region No depletion  Clavicle Bone Region No depletion  Clavicle and Acromion Bone Region No depletion  Scapular Bone Region No depletion  Dorsal Hand Mild depletion  Patellar Region Mild depletion  Anterior Thigh Region Mild depletion  Posterior Calf Region Mild depletion  Edema (RD Assessment) None  Hair Reviewed  Eyes Reviewed  Mouth Reviewed  Skin Reviewed  Nails Reviewed       Diet Order:   Diet Order            DIET DYS 2 Room service appropriate? Yes with Assist; Fluid consistency: Thin  Diet effective now                 EDUCATION NEEDS:   Education needs have been addressed  Skin:  Skin Assessment: Reviewed RN Assessment  Last BM:  06/27/20  Height:   Ht Readings from Last 1 Encounters:  06/27/20 5\' 7"  (1.702 m)    Weight:  Wt Readings from Last 1 Encounters:  06/27/20 83.2 kg    Ideal Body Weight:  67.3 kg  BMI:  Body mass index is 28.73 kg/m.  Estimated Nutritional Needs:   Kcal:  1800-2000  Protein:  100-115 grams  Fluid:  > 1.8 L    Loistine Chance, RD, LDN, [place] Registered Dietitian II Certified Diabetes Care and Education Specialist Please refer to [facility] for RD and/or RD on-call/weekend/after hours pager

## 2020-06-29 NOTE — Evaluation (Signed)
Speech Language Pathology Assessment and Plan  Patient Details  Name: Andres White MRN: [id number] Date of Birth: 1945-08-22  SLP Diagnosis: Cognitive Impairments  Rehab Potential: Good ELOS: 17-21 days    Today's Date: 06/29/2020 SLP Individual Time: 6659-9357 SLP Individual Time Calculation (min): 23 min   Hospital Problem: Principal Problem:   CVA (cerebral vascular accident) [facility]) Active Problems:   Hypertension   Faintness   Chest discomfort   Left carotid stenosis   Right leg weakness   Stroke (cerebrum) ([place])   Brainstem infarct, acute ([place])   Acute blood loss anemia   AKI (acute kidney injury) ([place])   Tachycardia  Past Medical History:  Past Medical History:  Diagnosis Date  . Arthritis   . Chronic back pain   . Gout   . Hypertension   . Syncope and collapse    Past Surgical History:  Past Surgical History:  Procedure Laterality Date  . CARDIAC CATHETERIZATION  2015   [facility]   . KNEE SURGERY      Assessment / Plan / Recommendation Clinical Impression   HPI: Andres White a 73 y.o.malewith PMH of HTN, arthritis, tobacco use, who presented to [facility] on 06/24/20 with 4 day history of right leg weakness, L facial weakness, and speech changes. 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  Carotid Dopplers done showing 70 to 99% stenosis of left ICA/CCA as well as moderate heterogenous partially calcified plaque at right carotid bifurcation.  2D echo showed EF 60 to 65% with no wall abnormality and severe concentric left ventricular hypertrophy. Neurology recommended ASA and statin for secondary stroke prevention. He had bouts of asymptomatic nonsustained episode of V. tach with activity and cardiac monitor was given with recommendations to follow-up with cardiology on outpatient basis. Speech  therapy evaluation revealed mild to moderate cognitive linguistic deficits with mild dysarthria.  Patient with resultant right-sided weakness with flexed posture, disorientation with impaired problem-solving and requires cues to complete task. Pt was admitted to CIR 06/27/20 and SLP evaluation was completed 06/29/20 with results as follows:  Pt presents with moderate functional cognitive deficits marked by decreased mildly complex problem solving, working and short term memory, orientation, and emergent awareness. He was oriented to self and hospital, but reported he could not recall reason for hospitalization and stated it was Jan 2004. Pt's scores on Cognistat evaluation that fell outside of normal limits included orientation (6-moderate), attention (4-mild-mod and believe more influenced by working/short term memory), Ecologist (0-severe), (memory-5 moderately severe), calculations (0-severe), and similarities (2 -severe). Pt's speech was fluent and expressive/receptive language determined WFL. Although very mild articulatory imprecision noted with plosives and lateralization of /s/ noted, believe this was influenced by missing dentition and pt reports is baseline.   Recommend pt receive skilled ST to address cognitive impairments as described above in order to maximize his functional independence and safety prior to discharge home.    Skilled Therapeutic Interventions          Cognitive-linguistic evaluation was administered and results were reviewed with pt (please see above for details regarding results).   SLP Assessment  Patient will need skilled [facility] during CIR admission    Recommendations  Patient destination: Home Follow up Recommendations: 24 hour supervision/assistance;Home Health SLP Equipment Recommended: None recommended by SLP    SLP Frequency 3 to 5 out  of 7 days   SLP Duration  SLP Intensity  SLP Treatment/Interventions 17-21  days  Minumum of 1-2 x/day, 30 to 90 minutes  Cognitive remediation/compensation;Cueing hierarchy;Functional tasks;Patient/family education;Therapeutic Activities;Internal/external aids    Pain Pain Assessment Pain Scale: Faces Faces Pain Scale: Hurts a little bit Pain Type: Acute pain Pain Location: Leg Pain Orientation: Left;Right Pain Descriptors / Indicators: Aching;Discomfort Pain Onset: Gradual Pain Intervention(s): RN made aware Multiple Pain Sites: No  Prior Functioning Type of Home: House  Lives With: Alone Available Help at Discharge: Family;Friend(s);Available 24 hours/day (per pt report, will need to confirm)  SLP Evaluation Cognition Overall Cognitive Status: Impaired/Different from baseline Arousal/Alertness: Awake/alert Orientation Level: Oriented to person;Disoriented to situation;Disoriented to place;Disoriented to time (oriented to hospital but thought he was at [facility]) Attention: Sustained Sustained Attention: Appears intact Memory: Impaired Memory Impairment: Decreased recall of new information;Decreased short term memory;Retrieval deficit Immediate Memory Recall: Sock;Bed;Blue Memory Recall Sock: Not able to recall Memory Recall Blue: Not able to recall Memory Recall Bed: Not able to recall Awareness: Impaired Executive Function: Reasoning;Organizing;Sequencing Reasoning: Impaired Reasoning Impairment: Verbal complex Sequencing: Impaired Sequencing Impairment: Functional basic Organizing: Impaired Organizing Impairment: Functional basic Safety/Judgment: Impaired  Comprehension Auditory Comprehension Overall Auditory Comprehension: Appears within functional limits for tasks assessed Conversation: Simple Interfering Components: Working Curator: Within Sports administrator Reading Comprehension Reading Status: Not tested Expression Expression Primary Mode of Expression: Verbal Verbal  Expression Overall Verbal Expression: Appears within functional limits for tasks assessed Written Expression Written Expression: Not tested Oral Motor Oral Motor/Sensory Function Overall Oral Motor/Sensory Function: Within functional limits Motor Speech Overall Motor Speech: Appears within functional limits for tasks assessed Respiration: Within functional limits Intelligibility: Intelligible Motor Planning: Witnin functional limits Motor Speech Errors: Not applicable  Care Tool Care Tool Cognition Expression of Ideas and Wants Expression of Ideas and Wants: Some difficulty - exhibits some difficulty with expressing needs and ideas (e.g, some words or finishing thoughts) or speech is not clear   Understanding Verbal and Non-Verbal Content Understanding Verbal and Non-Verbal Content: Usually understands - understands most conversations, but misses some part/intent of message. Requires cues at times to understand   Memory/Recall Ability *first 3 days only Memory/Recall Ability *first 3 days only: That he or she is in a hospital/hospital unit      Intelligibility: Intelligible     Short Term Goals: Week 1: SLP Short Term Goal 1 (Week 1): Pt will demonstrate ability to detect functional errors within tasks with Mod A verbal/visual cues. SLP Short Term Goal 2 (Week 1): Pt will recall new and/or daily information with Mod A for use of compensatory strategies or aids. SLP Short Term Goal 3 (Week 1): Pt will recall 2 safety precuations with Mod A cues for use of aids or strategies. SLP Short Term Goal 4 (Week 1): Pt will demonstrate ability to problem solving basic to mildly complex functional tasks with Mod A verbal/visual cues.  Refer to Care Plan for Long Term Goals  Recommendations for other services: None   Discharge Criteria: Patient will be discharged from SLP if patient refuses treatment 3 consecutive times without medical reason, if treatment goals not met, if there is a  change in medical status, if patient makes no progress towards goals or if patient is discharged from hospital.  The above assessment, treatment plan, treatment alternatives and goals were discussed and mutually agreed upon: by patient  Arbutus Leas 06/29/2020, 10:27 AM

## 2020-06-29 NOTE — Progress Notes (Signed)
Inpatient Rehabilitation  Patient information reviewed and entered into eRehab system by Allie Gerhold M. Samel Bruna, M.A., CCC/SLP, PPS Coordinator.  Information including medical coding, functional ability and quality indicators will be reviewed and updated through discharge.    

## 2020-06-29 NOTE — Progress Notes (Signed)
Orthopedic Tech Progress Note Patient Details:  Andres White 1946-06-02 [id number]  Ortho Devices Type of Ortho Device: Velcro wrist splint Ortho Device/Splint Location: RUE Ortho Device/Splint Interventions: Ordered, Application, Adjustment   Post Interventions Patient Tolerated: Well Instructions Provided: Care of device   [name] 06/29/2020, 5:19 PM

## 2020-06-29 NOTE — Evaluation (Signed)
Physical Therapy Assessment and Plan  Patient Details  Name: Andres White MRN: [id number] Date of Birth: 02-17-1946  PT Diagnosis: Difficulty walking and Hemiplegia dominant Rehab Potential: Good ELOS: ~3 weeks   Today's Date: 06/29/2020 PT Individual Time: 1330-1430 PT Individual Time Calculation (min): 60 min    Hospital Problem: Principal Problem:   CVA (cerebral vascular accident) ([place]) Active Problems:   Hypertension   Faintness   Chest discomfort   Left carotid stenosis   Right leg weakness   Stroke (cerebrum) ([place])   Brainstem infarct, acute ([place])   Acute blood loss anemia   AKI (acute kidney injury) ([place])   Tachycardia   Dysesthesia   Neuropathic pain   Past Medical History:  Past Medical History:  Diagnosis Date  . Arthritis   . Chronic back pain   . Gout   . Hypertension   . Syncope and collapse    Past Surgical History:  Past Surgical History:  Procedure Laterality Date  . CARDIAC CATHETERIZATION  2015   [facility]   . KNEE SURGERY      Assessment & Plan Clinical Impression: Patient is a 74 y.o.malewith PMH of HTN, arthritis, tobacco use, who presented to [facility] on 06/24/20 with 4 day history of right leg weakness, L facial weakness, and speech changes. 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  Carotid Dopplers done showing 70 to 99% stenosis of left ICA/CCA as well as moderate heterogenous partially calcified plaque at right carotid bifurcation.  2D echo showed EF 60 to 65% with no wall abnormality and severe concentric left ventricular hypertrophy. Neurology recommended ASA and statin for secondary stroke prevention. He had bouts of asymptomatic nonsustained episode of V. tach with activity and cardiac monitor was given with recommendations to follow-up with cardiology on outpatient basis.  Speech therapy evaluation revealed mild to moderate cognitive linguistic deficits with mild dysarthria.  Patient with resultant right-sided weakness with flexed posture, disorientation with impaired problem-solving and requires cues to complete task.  Patient transferred to CIR on 06/27/2020 .   Patient currently requires max with mobility secondary to muscle weakness, decreased cardiorespiratoy endurance, ataxia and decreased coordination, decreased attention to right, decreased attention, decreased problem solving, decreased safety awareness and decreased memory and decreased sitting balance, decreased standing balance, decreased postural control, hemiplegia and decreased balance strategies.  Prior to hospitalization, patient was independent  with mobility and lived with Alone in a House home.  Home access is 2Stairs to enter.  Patient will benefit from skilled PT intervention to maximize safe functional mobility, minimize fall risk and decrease caregiver burden for planned discharge home with 24 hour supervision.  Anticipate patient will benefit from follow up [place] at discharge.  PT - End of Session Activity Tolerance: Tolerates 10 - 20 min activity with multiple rests Endurance Deficit: Yes Endurance Deficit Description: Multiple rest breaks during session PT Assessment Rehab Potential (ACUTE/IP ONLY): Good PT Barriers to Discharge: Decreased caregiver support;Incontinence PT Patient demonstrates impairments in the following area(s): Balance;Behavior;Motor;Pain;Perception;Safety PT Transfers Functional Problem(s): Bed Mobility;Bed to Chair;Car PT Locomotion Functional Problem(s): Ambulation;Stairs PT Plan PT Intensity: Minimum of 1-2 x/day ,45 to 90 minutes PT Frequency: 5 out of 7 days PT Duration Estimated Length of Stay: ~3 weeks PT Treatment/Interventions: Ambulation/gait training;Cognitive remediation/compensation;Discharge planning;DME/adaptive equipment instruction;Functional mobility  training;Pain management;Psychosocial support;Splinting/orthotics;Therapeutic Activities;UE/LE Strength taining/ROM;Balance/vestibular training;Community reintegration;Disease management/prevention;Functional electrical stimulation;Neuromuscular re-education;Patient/family education;Skin  care/wound management;Stair training;Therapeutic Exercise;UE/LE Coordination activities;Wheelchair propulsion/positioning PT Transfers Anticipated Outcome(s): Supervision PT Locomotion Anticipated Outcome(s): Supervision PT Recommendation Follow Up Recommendations: Home health PT;24 hour supervision/assistance Patient destination: Home Equipment Recommended: To be determined   PT Evaluation Precautions/Restrictions Precautions Precautions: Fall Precaution Comments: watch HR Restrictions Weight Bearing Restrictions: No General Chart Reviewed: Yes Family/Caregiver Present: No  Home Living/Prior Functioning Home Living Available Help at Discharge: Family;Friend(s);Available 24 hours/day (per pt report. will need to confirm) Type of Home: House Home Access: Stairs to enter Entrance Stairs-Number of Steps: 2 Entrance Stairs-Rails: None Home Layout: One level Additional Comments: Pt reports he has grab bars and a cane, but does not use cane very often.  Frequent falls  Lives With: Alone Prior Function Level of Independence: Independent with basic ADLs;Independent with homemaking with ambulation  Able to Take Stairs?: Yes Driving: Yes Vision/Perception  Perception Perception: Impaired Inattention/Neglect: Does not attend to right side of body (mild)  Cognition Overall Cognitive Status: Impaired/Different from baseline Arousal/Alertness: Awake/alert Orientation Level: Oriented to person;Disoriented to situation;Disoriented to place;Disoriented to time Safety/Judgment: Impaired Sensation Sensation Light Touch: Appears Intact Coordination Gross Motor Movements are Fluid and Coordinated: No Fine  Motor Movements are Fluid and Coordinated: No Coordination and Movement Description: decreased smoothness and accuracy with R hand Motor  Motor Motor: Hemiplegia;Ataxia   Trunk/Postural Assessment  Cervical Assessment Cervical Assessment:  (forward head) Thoracic Assessment Thoracic Assessment:  (rounded shoulders) Lumbar Assessment Lumbar Assessment:  (posterior pelvic tilt) Postural Control Postural Control:  (slight posterior lean in sitting)  Balance Balance Balance Assessed: Yes Static Sitting Balance Static Sitting - Balance Support: Feet supported Static Sitting - Level of Assistance: 5: Stand by assistance Dynamic Sitting Balance Dynamic Sitting - Balance Support: Feet unsupported Dynamic Sitting - Level of Assistance: 4: Min assist Extremity Assessment   RLE Assessment General Strength Comments: Not able to formally test due to pain. Per visual assessment, grossly 3/5. LLE Assessment General Strength Comments: Grossly 4/5  Care Tool Care Tool Bed Mobility Roll left and right activity   Roll left and right assist level: Minimal Assistance - Patient > 75%    Sit to lying activity   Sit to lying assist level: Minimal Assistance - Patient > 75%    Lying to sitting edge of bed activity   Lying to sitting edge of bed assist level: Minimal Assistance - Patient > 75%     Care Tool Transfers Sit to stand transfer Sit to stand activity did not occur: Safety/medical concerns      Chair/bed transfer   Chair/bed transfer assist level: Maximal Assistance - Patient 25 - 49%     Toilet transfer Toilet transfer activity did not occur: Safety/medical concerns      Scientist, product/process development transfer activity did not occur: Safety/medical concerns        Care Tool Locomotion Ambulation Ambulation activity did not occur: Safety/medical concerns        Walk 10 feet activity Walk 10 feet activity did not occur: Safety/medical concerns       Walk 50 feet with 2 turns activity  Walk 50 feet with 2 turns activity did not occur: Safety/medical concerns      Walk 150 feet activity Walk 150 feet activity did not occur: Safety/medical concerns      Walk 10 feet on uneven surfaces activity Walk 10 feet on uneven surfaces activity did not occur: Safety/medical concerns      Stairs Stair activity did not occur: Safety/medical concerns  Walk up/down 1 step activity Walk up/down 1 step or curb (drop down) activity did not occur: Safety/medical concerns     Walk up/down 4 steps activity did not occuR: Safety/medical concerns  Walk up/down 4 steps activity      Walk up/down 12 steps activity Walk up/down 12 steps activity did not occur: Safety/medical concerns      Pick up small objects from floor Pick up small object from the floor (from standing position) activity did not occur: Safety/medical concerns      Wheelchair Will patient use wheelchair at discharge?: No          Wheel 50 feet with 2 turns activity      Wheel 150 feet activity        Refer to Care Plan for Long Term Goals  SHORT TERM GOAL WEEK 1 PT Short Term Goal 1 (Week 1): Pt will perform bed mobility with CGA. PT Short Term Goal 2 (Week 1): Pt wil perform sit to stand with modA. PT Short Term Goal 3 (Week 1): Pt will perform bed to chair transfer with minA. PT Short Term Goal 4 (Week 1): Pt will ambulate 100' with minA and LRAD.  Recommendations for other services: None   Skilled Therapeutic Intervention  Evaluation completed (see details above and below) with education on PT POC and goals and individual treatment initiated with focus on bed mobility, balance, and transfers.   Pt received supine in bed and agrees to therapy. No complaint of pain in initially. Pt performs supine to sit with minA and cues for hand placement and body mechanics.While seated EOB, pt attempts to performs it to stand and winces in pain, indicating significant pain in both feet. PT attempts to assist with  squat pivot transfer and notes that pt brief is saturated. Return to supine with minA. Pt performs bilateral rolling to assist with pericare and brief/lower body dressing change. Supine to sit with minA. Pt then performs squat pivot transfer to [facility] with maxA and difficulty WB due to foot pain. PT does screen of upper and lower extremities and notes that pt is extremely tender to touch at R MCP joint, and bilateral feet. No other pain noted.   WC transport to gym for time management and because pt R hand too painful for WC propulsion. Squat pivot to mat table with heavy maxA. Pt able to maintain static sitting balance with close supervision. Attempts sit to stand with RW but unable to clear buttocks due to pain in feet and subsequent lack of WB. Squat pivot back to WC from elevated mat with maxA. Pt left seated in WC with alarm intact and all needs within reach.   Mobility Bed Mobility Bed Mobility: Sit to Supine;Supine to Sit Supine to Sit: Minimal Assistance - Patient > 75% Sit to Supine: Minimal Assistance - Patient > 75% Transfers Transfers: Pharmacist, hospital Pivot Transfers: Maximal Assistance - Patient 25-49% Transfer (Assistive device): 1 person hand held assist Locomotion  Gait Ambulation: No Wheelchair Mobility Wheelchair Mobility: No   Discharge Criteria: Patient will be discharged from PT if patient refuses treatment 3 consecutive times without medical reason, if treatment goals not met, if there is a change in medical status, if patient makes no progress towards goals or if patient is discharged from hospital.  The above assessment, treatment plan, treatment alternatives and goals were discussed and mutually agreed upon: by patient  Breck Coons, PT, DPT 06/29/2020, 3:44 PM

## 2020-06-29 NOTE — Progress Notes (Addendum)
Patient ID: Andres White, male   DOB: 1945/12/30, 74 y.o.   MRN: [id number]  SW made efforts to complete assessment with pt. Due to pt confusion and being disoriented, SW to reach out to family in efforts to complete.   *SW completed assessment with pt dtr Heather [phone]).   Loralee Pacas, MSW, [place] Office: [phone] Cell: [phone] Fax: [phone]

## 2020-06-29 NOTE — Evaluation (Signed)
Occupational Therapy Assessment and Plan  Patient Details  Name: Andres White MRN: [id number] Date of Birth: [birth year]  OT Diagnosis: acute pain, hemiplegia affecting dominant side and muscle weakness (generalized) Rehab Potential: Rehab Potential (ACUTE ONLY): Good ELOS: 17-21 days   Today's Date: 06/29/2020 OT Individual Time: 0732-0900 OT Individual Time Calculation (min): 88 min     Hospital Problem: Principal Problem:   CVA (cerebral vascular accident) ([place]) Active Problems:   Hypertension   Faintness   Chest discomfort   Left carotid stenosis   Right leg weakness   Stroke (cerebrum) ([facility])   Brainstem infarct, acute ([facility])   Acute blood loss anemia   AKI (acute kidney injury) ([place])   Tachycardia   Past Medical History:  Past Medical History:  Diagnosis Date  . Arthritis   . Chronic back pain   . Gout   . Hypertension   . Syncope and collapse    Past Surgical History:  Past Surgical History:  Procedure Laterality Date  . CARDIAC CATHETERIZATION  2015   [facility]   . KNEE SURGERY      Assessment & Plan Clinical Impression: Patient is a 74 y.o. year old male with medical history significant of hypertension, arthritis, tobacco use, comes to the hospital with complaints of right leg weakness, L facial weakness noted and roommate mentioned changes in speech.[name] MRI+ for small acute infarcts in the left midbrain as well as several chronic infarcts including involvement of the left frontal and parietal lobes, right parietotemporal lobe, bilateral thalamus, and bilateral cerebellum. Patient transferred to CIR on 06/27/2020 .    Patient currently requires Max/ total with basic self-care skills secondary to muscle weakness and muscle joint tightness, decreased cardiorespiratoy endurance, abnormal tone, unbalanced muscle activation, motor apraxia, ataxia, decreased coordination and decreased motor planning, decreased midline orientation and decreased attention to right, decreased  initiation, decreased attention, decreased awareness, decreased problem solving, decreased safety awareness, decreased memory and delayed processing and decreased sitting balance, decreased standing balance, decreased postural control, hemiplegia and decreased balance strategies.  Prior to hospitalization, patient could complete BADL with independent .  Patient will benefit from skilled intervention to increase independence with basic self-care skills prior to discharge home with care partner.  Anticipate patient will require 24 hour supervision and follow up home health.  OT - End of Session Endurance Deficit: Yes Endurance Deficit Description: Rest breaks within BADL tasks OT Assessment Rehab Potential (ACUTE ONLY): Good OT Patient demonstrates impairments in the following area(s): Balance;Cognition;Endurance;Motor;Pain;Perception;Sensory OT Basic ADL's Functional Problem(s): Eating;Grooming;Bathing;Dressing;Toileting OT Transfers Functional Problem(s): Toilet;Tub/Shower OT Additional Impairment(s): Fuctional Use of Upper Extremity OT Plan OT Intensity: Minimum of 1-2 x/day, 45 to 90 minutes OT Frequency: 5 out of 7 days OT Duration/Estimated Length of Stay: 17-21 days OT Treatment/Interventions: Balance/vestibular training;Cognitive remediation/compensation;Community reintegration;Discharge planning;Disease mangement/prevention;DME/adaptive equipment instruction;Functional electrical stimulation;Functional mobility training;Neuromuscular re-education;Pain management;Patient/family education;Psychosocial support;Self Care/advanced ADL retraining;Splinting/orthotics;Therapeutic Activities;Therapeutic Exercise;UE/LE Strength taining/ROM;UE/LE Coordination activities;Wheelchair propulsion/positioning OT Self Feeding Anticipated Outcome(s): Set-up A OT Basic Self-Care Anticipated Outcome(s): Supervision OT Toileting Anticipated Outcome(s): Supervision OT Bathroom Transfers Anticipated Outcome(s):  Supervision OT Recommendation Patient destination: Home Follow Up Recommendations: Home health OT Equipment Recommended: To be determined   OT Evaluation Precautions/Restrictions  Precautions Precautions: Fall Restrictions Weight Bearing Restrictions: No Pain  Pt reports pain in B feet, ankles, and R thumb CMC joint, Pain reported to be aching, very sensitive to touch. Rest and repositioned for pain management.  Home Living/Prior Functioning Home Living Family/patient expects to be discharged to:: Private residence Living Arrangements:  Children Available Help at Discharge: Family, Friend(s), Available 24 hours/day (per pt report, will need to confirm) Type of Home: House Home Access: Stairs to enter [employer] of Steps: 2 Entrance Stairs-Rails: None Home Layout: One level Bathroom Shower/Tub: Engineer, manufacturing systems: Standard Bathroom Accessibility: Yes Additional Comments: Pt reports he has grab bars and a cane, but does not use cane very often.  Frequent falls  Lives With: Alone IADL History Current License: Yes Type of Occupation: Worked in Scientist, clinical (histocompatibility and immunogenetics) Leisure and Hobbies: Likes to [employer] horse shoes IADL Comments: Pt reports he was driving, going grocery shopping and doing his own cooking Prior Function Level of Independence: Independent with basic ADLs, Independent with homemaking with ambulation Driving: Yes Vision Patient Visual Report: No change from baseline Perception  Perception: Impaired Inattention/Neglect: Does not attend to right side of body (mild) Cognition Overall Cognitive Status: Impaired/Different from baseline Arousal/Alertness: Awake/alert Orientation Level: Person;Place;Situation Person: Oriented Place: Disoriented (initially stated he was in [place], then corrected w/ cue) Situation: Oriented Year: Other (Comment) (2002) Month: April Day of Week: Incorrect Memory: Impaired Memory Impairment: Decreased  recall of new information;Decreased short term memory Immediate Memory Recall: Sock;Bed;Blue Memory Recall Sock: Not able to recall Memory Recall Blue: Not able to recall Memory Recall Bed: Not able to recall Awareness: Impaired Sequencing: Impaired Sensation Sensation Light Touch: Appears Intact Hot/Cold: Appears Intact Coordination Gross Motor Movements are Fluid and Coordinated: No Fine Motor Movements are Fluid and Coordinated: No Coordination and Movement Description: decreased smoothness and accuracy with R hand Finger Nose Finger Test: dysmetria Motor  Motor Motor: Hemiplegia;Ataxia  Extremity/Trunk Assessment RUE Assessment RUE Assessment: Exceptions to [facility] Active Range of Motion (AROM) Comments: limited shoulder,elbow, wirst, hand ROM 2/2 strength deficits. GDiminished grip strength RUE Body System: Neuro Brunstrum levels for arm and hand: Hand;Arm Brunstrum level for arm: Stage IV Movement is deviating from synergy Brunstrum level for hand: Stage IV Movements deviating from synergies RUE AROM (degrees) Right Shoulder Flexion: 70 Degrees RUE Strength Right Shoulder Flexion: 2+/5 LUE Assessment LUE Assessment: Exceptions to [facility] General Strength Comments: 4-/5 overall, baseline shoulder ROM up to 130  Care Tool Care Tool Self Care Eating   Eating Assist Level: Moderate Assistance - Patient 50 - 74%    Oral Care  Oral care, brush teeth, clean dentures activity did not occur: Refused      Bathing   Body parts bathed by patient: Abdomen;Chest;Front perineal area;Left upper leg;Right upper leg;Face;Right arm Body parts bathed by helper: Left arm;Buttocks;Right lower leg;Left lower leg;Face   Assist Level: Maximal Assistance - Patient 24 - 49%    Upper Body Dressing(including orthotics)   What is the patient wearing?: Pull over shirt   Assist Level: Maximal Assistance - Patient 25 - 49%    Lower Body Dressing (excluding footwear)   What is the patient  wearing?: Pants;Incontinence brief Assist for lower body dressing: Total Assistance - Patient < 25%    Putting on/Taking off footwear   What is the patient wearing?: Non-skid slipper socks Assist for footwear: Total Assistance - Patient < 25%       Care Tool Toileting Toileting activity Toileting Activity did not occur (Clothing management and hygiene only): N/A (no void or bm)       Care Tool Bed Mobility Roll left and right activity   Roll left and right assist level: Minimal Assistance - Patient > 75%    Sit to lying activity   Sit to lying assist level: Minimal Assistance - Patient >  75%    Lying to sitting edge of bed activity         Care Tool Transfers Sit to stand transfer Sit to stand activity did not occur: Safety/medical concerns      Chair/bed transfer Chair/bed transfer activity did not occur: Safety/medical concerns       Toilet transfer Toilet transfer activity did not occur: Safety/medical concerns       Care Tool Cognition Expression of Ideas and Wants Expression of Ideas and Wants: Some difficulty - exhibits some difficulty with expressing needs and ideas (e.g, some words or finishing thoughts) or speech is not clear   Understanding Verbal and Non-Verbal Content Understanding Verbal and Non-Verbal Content: Usually understands - understands most conversations, but misses some part/intent of message. Requires cues at times to understand   Memory/Recall Ability *first 3 days only Memory/Recall Ability *first 3 days only: That he or she is in a hospital/hospital unit    Refer to Care Plan for [place] 1 OT Short Term Goal 1 (Week 1): Pt will complete sit<>stand with max A of 1 in preparation for BADL tasks OT Short Term Goal 2 (Week 1): Pt will complete toilet transfer with max A OT Short Term Goal 3 (Week 1): Pt will perform LB dressing with mod A OT Short Term Goal 4 (Week 1): Pt will perform UB dressing with min  A  Recommendations for other services: Neuropsych   Skilled Therapeutic Intervention Pt greeted semi-reclined in bed and agreeable to OT eval and treat. Pt wanted to shower today. Pt completed bed mobility with HOB flat and min A. Attempted sit<>stand multiple times with and without RW with pt unable to power up even with OT providing max/total A. Pt reported pain in his B LE, a burning, tingling sensation and reports feeling weaker today. OT obtained [place] and attempted sit<>stand in [place] with pt still needing total A and could not power up at all. Attempted scooting along EOB, but pt very guarded 2/2 pain and could not scoot. Per PT note from 2 days ago, pt able to stand with min A and ambulate with mod A. Pt reports onset of pain yesterday. Pt even wincing when OT assisted donning socks, stating his feet are very sensitive-which is also new. UB bathing/dressing completed while seated EOB with pt needing min/mod A for sitting balance and hand over hand A to integrate R UE into bathing tasks. Pt also with painful R wrist, but not present on L side. Pt needed OT assist to open containers 2/2 poor gip on R hand. Pt returned to bed for LB bathing/dressing. Mod A for rolling while OT assisted with washing buttocks and donning clean brief. Max/total A for bed level LB dressing.  MD entered room to assess patient. OT voiced concerns of pain and pt performed bed mobility and attempted another stand in front of MD. Pt again could not power up. Pt returned to bed and left semi-reclined in bed with bed alarm on, call bell in reach, and needs met.  ADL ADL Eating: Maximal assistance (with R hand) Grooming: Moderate assistance (with R hand) Upper Body Bathing: Moderate assistance Lower Body Bathing: Maximal assistance Upper Body Dressing: Moderate assistance Lower Body Dressing: Maximal assistance;Dependent Toileting: Unable to assess Toilet Transfer: Unable to assess Mobility  Bed Mobility Bed Mobility: Sit  to Supine;Supine to Sit Supine to Sit: Minimal Assistance - Patient > 75% Sit to Supine: Minimal Assistance - Patient > 75%  Discharge Criteria: Patient will be discharged from OT if patient refuses treatment 3 consecutive times without medical reason, if treatment goals not met, if there is a change in medical status, if patient makes no progress towards goals or if patient is discharged from hospital.  The above assessment, treatment plan, treatment alternatives and goals were discussed and mutually agreed upon: by patient  Valma Cava 06/29/2020, 9:02 AM

## 2020-06-29 NOTE — Progress Notes (Signed)
   Patient Details  Name: Andres White MRN: [id number] Date of Birth: 08/25/1945  Today's Date: 06/29/2020  Hospital Problems: Principal Problem:   CVA (cerebral vascular accident) [facility]) Active Problems:   Hypertension   Faintness   Chest discomfort   Left carotid stenosis   Right leg weakness   Stroke (cerebrum) ([place])   Brainstem infarct, acute ([place])   Acute blood loss anemia   AKI (acute kidney injury) ([place])   Tachycardia   Dysesthesia   Neuropathic pain  Past Medical History:  Past Medical History:  Diagnosis Date  . Arthritis   . Chronic back pain   . Gout   . Hypertension   . Syncope and collapse    Past Surgical History:  Past Surgical History:  Procedure Laterality Date  . CARDIAC CATHETERIZATION  2015   [facility]   . KNEE SURGERY     Social History:  reports that he has been smoking cigarettes. He has smoked for the past 20.00 years. He has never used smokeless tobacco. He reports current alcohol use. He reports that he does not use drugs.  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 Recent Psychosocial Issues: Pt dtr denied Psychiatric History: Pt dtr denied Substance Abuse History: Pt dtr denied. States pt is now only smoking 3 ciagerettes per day; has been smoking for almost 50 years.  Patient / Family Perceptions, Expectations & Goals Pt/Family understanding of illness & functional limitations: pt family has general understanding of care needs Premorbid pt/family roles/activities: Independent Anticipated changes in roles/activities/participation: Assistance with ADLs/IADLs Pt/family expectations/goals: His daughter states her goal is for her father to "bet back to himself, and being able to provide care for himself."  [employer]: None Premorbid Home Care/DME Agencies: None Transportation available at discharge: family Resource referrals recommended: Neuropsychology  Discharge Planning Living Arrangements: Children Support Systems: Children, Other relatives Type of Residence: Private residence Insurance Resources: Multimedia programmer (specify) ([employer] Medicare) Museum/gallery curator Resources: Radio broadcast assistant Screen Referred: No Living Expenses: Mortgage Money Management: Patient Does the patient have any problems obtaining your medications?: No Patient/Family Preliminary Plans: TBD  Clinical Impression SW completed assessment with his dtr Andres White. She denies that pt is a English as a second language teacher. No HCPOA. No DME prior to admission. Pt was very independent. States he will d/c to her home with 24/7 support from her and her children. Sw informed will follow-up after team conference.   Andres White A Andres White 06/29/2020, 3:58 PM

## 2020-06-30 ENCOUNTER — Inpatient Hospital Stay (HOSPITAL_COMMUNITY): Payer: Medicare Other | Admitting: Speech Pathology

## 2020-06-30 ENCOUNTER — Inpatient Hospital Stay (HOSPITAL_COMMUNITY): Payer: Medicare Other

## 2020-06-30 DIAGNOSIS — N179 Acute kidney failure, unspecified: Secondary | ICD-10-CM | POA: Diagnosis not present

## 2020-06-30 DIAGNOSIS — D62 Acute posthemorrhagic anemia: Secondary | ICD-10-CM | POA: Diagnosis not present

## 2020-06-30 DIAGNOSIS — I952 Hypotension due to drugs: Secondary | ICD-10-CM

## 2020-06-30 DIAGNOSIS — I6389 Other cerebral infarction: Secondary | ICD-10-CM | POA: Diagnosis not present

## 2020-06-30 DIAGNOSIS — R208 Other disturbances of skin sensation: Secondary | ICD-10-CM | POA: Diagnosis not present

## 2020-06-30 LAB — BASIC METABOLIC PANEL
Anion gap: 10 (ref 5–15)
BUN: 36 mg/dL — ABNORMAL HIGH (ref 8–23)
CO2: 21 mmol/L — ABNORMAL LOW (ref 22–32)
Calcium: 9.3 mg/dL (ref 8.9–10.3)
Chloride: 101 mmol/L (ref 98–111)
Creatinine, Ser: 2.21 mg/dL — ABNORMAL HIGH (ref 0.61–1.24)
GFR, Estimated: 30 mL/min — ABNORMAL LOW (ref >60)
Glucose, Bld: 123 mg/dL — ABNORMAL HIGH (ref 70–99)
Potassium: 4 mmol/L (ref 3.5–5.1)
Sodium: 132 mmol/L — ABNORMAL LOW (ref 135–145)

## 2020-06-30 LAB — CBC
HCT: 34.9 % — ABNORMAL LOW (ref 39.0–52.0)
Hemoglobin: 11.2 g/dL — ABNORMAL LOW (ref 13.0–17.0)
MCH: 30.4 pg (ref 26.0–34.0)
MCHC: 32.1 g/dL (ref 30.0–36.0)
MCV: 94.6 fL (ref 80.0–100.0)
Platelets: 154 10*3/uL (ref 150–400)
RBC: 3.69 MIL/uL — ABNORMAL LOW (ref 4.22–5.81)
RDW: 12.7 % (ref 11.5–15.5)
WBC: 13.8 10*3/uL — ABNORMAL HIGH (ref 4.0–10.5)
nRBC: 0 % (ref 0.0–0.2)

## 2020-06-30 MED ORDER — SODIUM CHLORIDE 0.9 % IV SOLN: INTRAVENOUS | Status: DC

## 2020-06-30 MED ORDER — GABAPENTIN 100 MG PO CAPS
100.0000 mg | ORAL_CAPSULE | Freq: Every day | ORAL | Status: DC
  Administered 2020-07-01 – 2020-07-08 (×8): 100 mg via ORAL
  Filled 2020-06-30 (×9): qty 1

## 2020-06-30 MED ORDER — LISINOPRIL 10 MG PO TABS
10.0000 mg | ORAL_TABLET | Freq: Every day | ORAL | Status: DC
  Administered 2020-07-01 – 2020-07-18 (×18): 10 mg via ORAL
  Filled 2020-06-30 (×18): qty 1

## 2020-06-30 NOTE — Progress Notes (Signed)
Patient is alert ; responds to conversations. Offered water to drink. Continued to monitor.MEWs protocol followed.

## 2020-06-30 NOTE — Progress Notes (Signed)
Patient is alert and engages in conversation. Offered and gave pt water to drink and claims he might be dehydrated. Continued MEWS protocol

## 2020-06-30 NOTE — Progress Notes (Signed)
   06/30/20 0944  Assess: MEWS Score  BP (!) 74/57 (MD notified)  Assess: MEWS Score  MEWS Temp 0  MEWS Systolic 2  MEWS Pulse 0  MEWS RR 0  MEWS LOC 0  MEWS Score 2  MEWS Score Color Yellow  Assess: if the MEWS score is Yellow or Red  Were vital signs taken at a resting state? Yes  Focused Assessment Change from prior assessment (see assessment flowsheet)  Early Detection of Sepsis Score *See Row Information* Low  MEWS guidelines implemented *See Row Information* Yes  Treat  Pain Scale 0-10  Pain Score 5  Pain Type Acute pain  Pain Location Hand  Pain Orientation Right  Pain Descriptors / Indicators Aching;Discomfort  Pain Onset On-going  Pain Intervention(s) Other (Comment)  Notify: Charge Nurse/RN  Name of Charge Nurse/RN Notified ashley RN  Date Charge Nurse/RN Notified 06/30/20  Time Charge Nurse/RN Notified [phone]  Notify: Provider  Provider Name/Title patel   Date Provider Notified 06/30/20  Time Provider Notified [phone]  Notification Type Call  Notification Reason Other (Comment)  Document  Patient Outcome Other (Comment) (monitoring per mews protocol)  Progress note created (see row info) Yes

## 2020-06-30 NOTE — Progress Notes (Signed)
   06/30/20 0956  Assess: MEWS Score  Temp (!) 97.4 F (36.3 C) (after eating magic cup)  BP 96/64  Pulse Rate 74  Resp 14  Level of Consciousness Alert  SpO2 94 %  O2 Device Room Air  Assess: if the MEWS score is Yellow or Red  Were vital signs taken at a resting state? Yes  Focused Assessment Change from prior assessment (see assessment flowsheet)  Early Detection of Sepsis Score *See Row Information* Low  MEWS guidelines implemented *See Row Information* Yes

## 2020-06-30 NOTE — Progress Notes (Signed)
RN was called to room per PT patient was sleepy. RN was able to have conversation with patient this morning when he was taking his medications. BP noted 117/71; morning meds given; PT rechecked bp after meds was 74/57; repeated bp on other arm and it was 75/54; Patient was awake and claims he feels dizzy when sitting on the bed. MD notified

## 2020-06-30 NOTE — Progress Notes (Signed)
Physical Therapy Session Note  Patient Details  Name: ISAHIA HOLLERBACH MRN: [id number] Date of Birth: 1946-05-13  Today's Date: 06/30/2020 PT Individual Time: 0900-0955 PT Individual Time Calculation (min): 55 min   Short Term Goals: Week 1:  PT Short Term Goal 1 (Week 1): Pt will perform bed mobility with CGA. PT Short Term Goal 2 (Week 1): Pt wil perform sit to stand with modA. PT Short Term Goal 3 (Week 1): Pt will perform bed to chair transfer with minA. PT Short Term Goal 4 (Week 1): Pt will ambulate 100' with minA and LRAD.  Skilled Therapeutic Interventions/Progress Updates:     Patient in bed asleep upon PT arrival. Patient aroused to verbal and tactile stimulation, but returns to sleeping without constant stimulation, remained lethargic throughout session. Patient reported B foot pain on palpation with grimacing, noted increased swelling R>L at particularly at great toe, increased pain indicators over joints during session, RN made aware. PT provided repositioning, rest breaks, and distraction as pain interventions throughout session.   Vitals: Position: supine BP: 79/55 HR: 76 SPO2: 93% on RA  RN made aware, cleared patient to sit EOB while monitoring vitals.  Position: sitting BP: 74/57 (symptomatic, dizzy, unable to resolve in sitting >2 min) HR: 92 SPO2: 96% on RA  RN made aware, returned patient to supine. Symptoms resolved in supine, patient with eyes closed in the bed. Focused remainder of session on arousal, drinking water, and eating a magic cup. Provided patient with blue tube with Koban around spoon for improved fit. Tried use of R hand for self-feeding, unable to grasp spoon with built-up handle without total A. Patient ate 50% of magic cup with total A and drank >50% of water in styrofoam cup set-up assist with a straw seated in the chair position in the bed. Patient maintained arousal >15 min.   Vitals:  Position: chair position in the bed BP: 96/64 HR:  77 SPO2: 97% on RA RN in room and aware  Therapeutic Activity: Bed Mobility: Patient performed supine to/from sit with min-mod A for lower extremity and trunk managementt due to lethargy with HOB elevated for sitting EOB. Provided verbal cues for sequencing and initiation throughout.  Patient in chair position, awake with nursing in the room, at end of session with breaks locked, bed alarm set, and all needs within reach.    Therapy Documentation Precautions:  Precautions Precautions: Fall Precaution Comments: watch HR Restrictions Weight Bearing Restrictions: No   Therapy/Group: Individual Therapy  Doreene Burke PT, DPT' 06/30/2020, 12:27 PM

## 2020-06-30 NOTE — Plan of Care (Signed)
  Problem: RH BOWEL ELIMINATION Goal: RH STG MANAGE BOWEL WITH ASSISTANCE Description: STG Manage Bowel with Assistance. Outcome: Not Progressing; constipation; miralax this morning; offered and gave patient water to drink

## 2020-06-30 NOTE — Progress Notes (Addendum)
[facility] PHYSICAL MEDICINE & REHABILITATION PROGRESS NOTE  Subjective/Complaints: Patient seen laying in bed this morning. He states he slept well overnight. Yesterday noted by regarding increased weakness. Later notified by nursing today regarding symptomatic hypotension. He states he notes improvement in pain with medications.  ROS: Denies CP, SOB, N/V/D  Objective: Vital Signs: Blood pressure 96/64, pulse 74, temperature 98.9 F (37.2 C), resp. rate 17, height 5\' 7"  (1.702 m), weight 83.2 kg, SpO2 91 %. No results found. Recent Labs    06/28/20 0407  WBC 9.0  HGB 12.7*  HCT 39.1  PLT 154   Recent Labs    06/28/20 0407 06/29/20 0441  NA 137 134*  K 3.8 4.1  CL 106 103  CO2 21* 20*  GLUCOSE 105* 119*  BUN 30* 22  CREATININE 1.63* 1.25*  CALCIUM 9.7 9.6    Intake/Output Summary (Last 24 hours) at 06/30/2020 0957 Last data filed at 06/30/2020 0943 Gross per 24 hour  Intake 50 ml  Output --  Net 50 ml        Physical Exam: BP 96/64 (BP Location: Left Arm)   Pulse 74   Temp 98.9 F (37.2 C)   Resp 17   Ht 5\' 7"  (1.702 m)   Wt 83.2 kg   SpO2 91%   BMI 28.73 kg/m  Constitutional: No distress . Vital signs reviewed. HENT: Normocephalic.  Atraumatic. Eyes: EOMI. No discharge. Cardiovascular: No JVD.  RRR. Respiratory: Normal effort.  No stridor.  Bilateral clear to auscultation. GI: Non-distended.  BS +. Skin: Warm and dry.  Intact. Psych: Normal mood.  Normal behavior. Musc: Right wrist with tenderness. No edema. Neuro: Alert Mild expressive aphasia and dysarthria, stable Right facial droop.  Motor: LUE/LLE: 5/5 proximal to distal RUE: 2+/5 proximal to distal with ataxia (? Pain inhibition) RLE: 2/5 proximal to distal  Assessment/Plan: 1. Functional deficits which require 3+ hours per day of interdisciplinary therapy in a comprehensive inpatient rehab setting.  Physiatrist is providing close team supervision and 24 hour management of active  medical problems listed below.  Physiatrist and rehab team continue to assess barriers to discharge/monitor patient progress toward functional and medical goals   Care Tool:  Bathing    Body parts bathed by patient: Abdomen, Chest, Front perineal area, Left upper leg, Right upper leg, Face, Right arm   Body parts bathed by helper: Left arm, Buttocks, Right lower leg, Left lower leg, Face     Bathing assist Assist Level: Maximal Assistance - Patient 24 - 49%     Upper Body Dressing/Undressing Upper body dressing   What is the patient wearing?: Pull over shirt    Upper body assist Assist Level: Total Assistance - Patient < 25%    Lower Body Dressing/Undressing Lower body dressing      What is the patient wearing?: Pants, Incontinence brief     Lower body assist Assist for lower body dressing: Total Assistance - Patient < 25%     Toileting Toileting Toileting Activity did not occur Landscape architect and hygiene only): N/A (no void or bm)  Toileting assist       Transfers Chair/bed transfer  Transfers assist  Chair/bed transfer activity did not occur: Safety/medical concerns  Chair/bed transfer assist level: Maximal Assistance - Patient 25 - 49%     Locomotion Ambulation   Ambulation assist   Ambulation activity did not occur: Safety/medical concerns          Walk 10 feet activity   Assist  Walk 10 feet activity did not occur: Safety/medical concerns        Walk 50 feet activity   Assist Walk 50 feet with 2 turns activity did not occur: Safety/medical concerns         Walk 150 feet activity   Assist Walk 150 feet activity did not occur: Safety/medical concerns         Walk 10 feet on uneven surface  activity   Assist Walk 10 feet on uneven surfaces activity did not occur: Safety/medical concerns         Wheelchair     Assist Will patient use wheelchair at discharge?: No             Wheelchair 50 feet with 2  turns activity    Assist            Wheelchair 150 feet activity     Assist           Medical Problem List and Plan: 1.  Impaired mobility and ADLs secondary to acute CVA with left midbrain infarct  Continue CIR  Repeat head CT ordered given Weakness and autonomic changes. 2.  Antithrombotics: -DVT/anticoagulation:  Pharmaceutical: Lovenox             -antiplatelet therapy: Aspirin 3. Pain Management: Tylenol prn.   Gabapentin 100 3 times daily started 11/26 for?  Neuropathic pain post stroke, decreased to nightly on 11/28 due to? Side effects 4. Mood: Continue Celexa 20mg  daily for depression 5. Neuropsych: This patient is not fully capable of making decisions on his own behalf. 6. Skin/Wound Care: ZioPatch in place, dry skin- added Eucerin cream 7. Fluids/Electrolytes/Nutrition: Monitor I/Os. Encourage fluid intake  8. HTN: Monitor BP   Decrease Amlodipine to 5mg , DC'd on 11/28.   Continue Lisinopril 40mg  daily, decreased to 10 on 11/28  Hypotension on 11/28  Monitor with increased mobility 9. Tobacco abuse: Provide counseling  10. Tachycardia/beats of NSVT: Zio patch in place.   Heart rate controlled on 11/28  Monitor with increased activity 11. AKI  Creatinine 1.25 on 11/26, repeat labs ordered  Will consider IVF pending results  Encourage fluids 12. ABLA  Hb 12.7 on 11/25, repeat labs ordered  Cont to monitor  LOS: 3 days A FACE TO FACE EVALUATION WAS PERFORMED  Mont Jagoda Lorie Phenix 06/30/2020, 9:57 AM

## 2020-06-30 NOTE — Plan of Care (Signed)
  Problem: RH Swallowing Goal: LTG Patient will consume least restrictive diet using compensatory strategies with assistance (SLP) Description: LTG:  Patient will consume least restrictive diet using compensatory strategies with assistance (SLP) Flowsheets (Taken 06/30/2020 1439) LTG: Pt Patient will consume least restrictive diet using compensatory strategies with assistance of (SLP): Modified Independent Note: Goal added 11/27 Goal: LTG Patient will participate in dysphagia therapy to increase swallow function with assistance (SLP) Description: LTG:  Patient will participate in dysphagia therapy to increase swallow function with assistance (SLP) Flowsheets (Taken 06/30/2020 1439) LTG: Pt will participate in dysphagia therapy to increase swallow function with assistance of (SLP): Modified Independent Note: Goal added 11/27 Goal: LTG Pt will demonstrate functional change in swallow as evidenced by bedside/clinical objective assessment (SLP) Description: LTG: Patient will demonstrate functional change in swallow as evidenced by bedside/clinical objective assessment (SLP) Flowsheets (Taken 06/30/2020 1439) LTG: Patient will demonstrate functional change in swallow as evidenced by bedside/clinical objective assessment: Oral swallow Note: Goal added 11/27

## 2020-06-30 NOTE — Evaluation (Signed)
Speech Language Pathology Bedside Swallow Evaluation   Patient Details  Name: Andres White MRN: [id number] Date of Birth: August 01, 1946  SLP Diagnosis: Dysphagia  Rehab Potential: Good ELOS: 17-21 days    Today's Date: 06/30/2020 SLP Individual Time: 1225-1250 SLP Individual Time Calculation (min): 25 min and Today's Date: 06/30/2020 SLP Missed Time: 35 Minutes Missed Time Reason: CT/MRI   Hospital Problem: Principal Problem:   CVA (cerebral vascular accident) [facility]) Active Problems:   Hypertension   Faintness   Chest discomfort   Left carotid stenosis   Right leg weakness   Stroke (cerebrum) ([place])   Brainstem infarct, acute ([place])   Acute blood loss anemia   AKI (acute kidney injury) ([place])   Tachycardia   Dysesthesia   Neuropathic pain   Drug-induced hypotension  Past Medical History:  Past Medical History:  Diagnosis Date  . Arthritis   . Chronic back pain   . Gout   . Hypertension   . Syncope and collapse    Past Surgical History:  Past Surgical History:  Procedure Laterality Date  . CARDIAC CATHETERIZATION  2015   [facility]   . KNEE SURGERY      Assessment / Plan / Recommendation Clinical Impression Patient demonstrates a mild oral dysphagia characterized by prolonged mastication with intermittent right buccal pocketing that patient eventually clears. Mild oral residue noted after the swallow that cleared with spontaneous liquid washes. Min-Mod verbal cues were also needed to monitor bolus size with solid textures. No overt s/s of aspiration noted with solids or thin liquids via straw. Patient is edentulous and reported increased ease with chopped solids, therefore, recommend patient continue current diet of Dys. 2 textures with thin liquids. Also recommend patient be placed on full supervision to maximize safety and encouragement with PO intake. Both the patent and his daughter verbalized understanding and agreement.    Skilled Therapeutic Interventions           Patient missed initial 60 minute session this morning due to being off the unit for a CT scan. Therefore, SLP re-attempted to see the patient with lunch to administer a BSE. Patient required encouragement for PO intake but was eventually agreeable. Please see above for details. Patient left upright in bed with all needs within reach, alarm on and daughter present. Continue with current plan of care.    SLP Assessment  Patient will need skilled Speech Lanaguage Pathology Services during CIR admission    Recommendations  SLP Diet Recommendations: Dysphagia 2 (Fine chop);Thin Liquid Administration via: Cup;Straw Medication Administration: Whole meds with puree Supervision: Patient able to self feed;Full supervision/cueing for compensatory strategies Compensations: Minimize environmental distractions;Slow rate;Small sips/bites;Lingual sweep for clearance of pocketing Postural Changes and/or Swallow Maneuvers: Seated upright 90 degrees Oral Care Recommendations: Oral care BID Recommendations for Other Services: Neuropsych consult Patient destination: Home Follow up Recommendations: 24 hour supervision/assistance;Home Health SLP Equipment Recommended: None recommended by SLP    SLP Frequency 3 to 5 out of 7 days   SLP Duration  SLP Intensity  SLP Treatment/Interventions 17-21 days  Minumum of 1-2 x/day, 30 to 90 minutes  Dysphagia/aspiration precaution training;Cueing hierarchy;Therapeutic Activities;Patient/family education;Functional tasks;Environmental controls;Internal/external aids    Pain No/Denies Pain  Bedside Swallowing Assessment General Date of Onset: 06/24/20 Previous Swallow Assessment: N/A Diet Prior to this Study: Regular;Thin liquids (RD changed to Dys. 2 textures) Temperature Spikes Noted: No Respiratory Status: Room air History of Recent Intubation: No Behavior/Cognition: Cooperative;Lethargic/Drowsy;Requires cueing Oral Cavity - Dentition:  Edentulous Self-Feeding Abilities: Able to feed self;Needs  assist;Needs set up Patient Positioning: Upright in bed Baseline Vocal Quality: Normal Volitional Cough: Strong Volitional Swallow: Able to elicit  Ice Chips Ice chips: Not tested Thin Liquid Thin Liquid: Within functional limits Presentation: Cup;Straw Nectar Thick Nectar Thick Liquid: Not tested Honey Thick Honey Thick Liquid: Not tested Puree Puree: Within functional limits Presentation: Self Fed;Spoon Solid Solid: Impaired Presentation: Self Fed;Spoon Oral Phase Functional Implications: Impaired mastication;Right lateral sulci pocketing BSE Assessment Risk for Aspiration Impact on safety and function: Mild aspiration risk Other Related Risk Factors: Lethargy;Deconditioning  Short Term Goals: Week 1: SLP Short Term Goal 1 (Week 1): Pt will demonstrate ability to detect functional errors within tasks with Mod A verbal/visual cues. SLP Short Term Goal 2 (Week 1): Pt will recall new and/or daily information with Mod A for use of compensatory strategies or aids. SLP Short Term Goal 3 (Week 1): Pt will recall 2 safety precuations with Mod A cues for use of aids or strategies. SLP Short Term Goal 4 (Week 1): Pt will demonstrate ability to problem solving basic to mildly complex functional tasks with Mod A verbal/visual cues. SLP Short Term Goal 5 (Week 1): Patient will consume current diet with minimal overt s/s of aspiration with supervision verbal cues for use of swallowing compensatory strategies. SLP Short Term Goal 6 (Week 1): Patient will demonstrate efficient mastication and complete oral clearance without overt s/s of aspiration with trials of Dys. 3 textures over 2 sessions prior to upgrade.  Refer to Care Plan for Long Term Goals  Recommendations for other services: Neuropsych  Discharge Criteria: Patient will be discharged from SLP if patient refuses treatment 3 consecutive times without medical reason, if  treatment goals not met, if there is a change in medical status, if patient makes no progress towards goals or if patient is discharged from hospital.  The above assessment, treatment plan, treatment alternatives and goals were discussed and mutually agreed upon: by patient and by family  Ishi Danser 06/30/2020, 2:44 PM

## 2020-06-30 NOTE — Progress Notes (Signed)
MD notified of last BP118/58; lab results and CT scan result .New orders noted

## 2020-06-30 NOTE — Progress Notes (Signed)
Occupational Therapy Session Note  Patient Details  Name: Andres White MRN: [id number] Date of Birth: 1946-02-17  Today's Date: 06/30/2020 OT Individual Time: 6440-3474 OT Individual Time Calculation (min): 30 min    Short Term Goals: Week 1:  OT Short Term Goal 1 (Week 1): Pt will complete sit<>stand with max A of 1 in preparation for BADL tasks OT Short Term Goal 2 (Week 1): Pt will complete toilet transfer with max A OT Short Term Goal 3 (Week 1): Pt will perform LB dressing with mod A OT Short Term Goal 4 (Week 1): Pt will perform UB dressing with min A  Skilled Therapeutic Interventions/Progress Updates:    1:1. Pt received in bed with breakfast tray present. Pt missed 30 min skilled OT d/t pt eating breakfast. Pt completes supine>sitting EOB with MAX A overall. Pt reporting 8/10 pain with repositioning as well as rest provided PRN. Pt washes face and applies lotion to LEs. Pt able to cross BLE into figure 4 with MIN A for positioning. Pt able to doff R sock and OT applies lotion. Ankles tender to touch and mildly swollen. Provided lightweight foam block to squeeze with RUE for grip strengthening Exited session with pt seated in bed, exit alarm on and call light in reach   Therapy Documentation Precautions:  Precautions Precautions: Fall Precaution Comments: watch HR Restrictions Weight Bearing Restrictions: No General:   Vital Signs:   Pain: Pain Assessment Pain Scale: 0-10 Pain Score: 5  Pain Type: Acute pain Pain Location: Generalized Pain Descriptors / Indicators: Aching;Discomfort Pain Onset: With Activity Pain Intervention(s): Medication (See eMAR);Repositioned ADL: ADL Eating: Maximal assistance (with R hand) Grooming: Moderate assistance (with R hand) Upper Body Bathing: Moderate assistance Lower Body Bathing: Maximal assistance Upper Body Dressing: Moderate assistance Lower Body Dressing: Maximal assistance, Dependent Toileting: Unable to assess Toilet  Transfer: Unable to assess Vision   Perception    Praxis   Exercises:   Other Treatments:     Therapy/Group: Individual Therapy  Tonny Branch 06/30/2020, 6:59 AM

## 2020-07-01 ENCOUNTER — Inpatient Hospital Stay (HOSPITAL_COMMUNITY): Payer: Medicare Other

## 2020-07-01 DIAGNOSIS — Z8679 Personal history of other diseases of the circulatory system: Secondary | ICD-10-CM

## 2020-07-01 DIAGNOSIS — D72829 Elevated white blood cell count, unspecified: Secondary | ICD-10-CM

## 2020-07-01 DIAGNOSIS — E871 Hypo-osmolality and hyponatremia: Secondary | ICD-10-CM

## 2020-07-01 DIAGNOSIS — I10 Essential (primary) hypertension: Secondary | ICD-10-CM

## 2020-07-01 DIAGNOSIS — R208 Other disturbances of skin sensation: Secondary | ICD-10-CM | POA: Diagnosis not present

## 2020-07-01 DIAGNOSIS — I6389 Other cerebral infarction: Secondary | ICD-10-CM | POA: Diagnosis not present

## 2020-07-01 DIAGNOSIS — N179 Acute kidney failure, unspecified: Secondary | ICD-10-CM | POA: Diagnosis not present

## 2020-07-01 DIAGNOSIS — I639 Cerebral infarction, unspecified: Secondary | ICD-10-CM | POA: Diagnosis not present

## 2020-07-01 DIAGNOSIS — I6522 Occlusion and stenosis of left carotid artery: Secondary | ICD-10-CM

## 2020-07-01 DIAGNOSIS — I69391 Dysphagia following cerebral infarction: Secondary | ICD-10-CM

## 2020-07-01 LAB — BASIC METABOLIC PANEL
Anion gap: 9 (ref 5–15)
BUN: 34 mg/dL — ABNORMAL HIGH (ref 8–23)
CO2: 20 mmol/L — ABNORMAL LOW (ref 22–32)
Calcium: 8.7 mg/dL — ABNORMAL LOW (ref 8.9–10.3)
Chloride: 101 mmol/L (ref 98–111)
Creatinine, Ser: 1.54 mg/dL — ABNORMAL HIGH (ref 0.61–1.24)
GFR, Estimated: 46 mL/min — ABNORMAL LOW (ref >60)
Glucose, Bld: 122 mg/dL — ABNORMAL HIGH (ref 70–99)
Potassium: 4 mmol/L (ref 3.5–5.1)
Sodium: 130 mmol/L — ABNORMAL LOW (ref 135–145)

## 2020-07-01 MED ORDER — PREDNISONE 20 MG PO TABS
40.0000 mg | ORAL_TABLET | Freq: Every day | ORAL | Status: DC
  Administered 2020-07-01 – 2020-07-05 (×5): 40 mg via ORAL
  Filled 2020-07-01 (×6): qty 2

## 2020-07-01 NOTE — Progress Notes (Signed)
[facility] PHYSICAL MEDICINE & REHABILITATION PROGRESS NOTE  Subjective/Complaints: Patient seen laying in bed this morning.  He states he slept very well overnight.  He continues to complain of wrist soreness, but no other joint pain. Patient with hypotension yesterday.   ROS: Right wrist pain.  Denies CP, SOB, N/V/D  Objective: Vital Signs: Blood pressure (!) 102/56, pulse 62, temperature 98.2 F (36.8 C), temperature source Oral, resp. rate 19, height 5\' 7"  (1.702 m), weight 83.2 kg, SpO2 100 %. CT HEAD WO CONTRAST  Result Date: 06/30/2020 CLINICAL DATA:  Stroke follow-up. EXAM: CT HEAD WITHOUT CONTRAST TECHNIQUE: Contiguous axial images were obtained from the base of the skull through the vertex without intravenous contrast. COMPARISON:  MRI and CT June 24, 2020. FINDINGS: Brain: Mild edema associated with the known left midbrain infarcts. No evidence of acute hemorrhage or substantial mass effect. Similar chronic infarcts involving the left frontal lobe, right parietal temporal lobe, bilateral cerebellar hemispheres, and bilateral thalami. Patchy white matter hypoattenuation, likely related to chronic microvascular ischemic disease. No hydrocephalus. Vascular: Calcific atherosclerosis. Skull: No acute fracture. Sinuses/Orbits: No acute finding. Other: No mastoid effusions. IMPRESSION: Mild edema associated with the known left midbrain infarcts. No evidence of acute hemorrhage or substantial mass effect. Electronically Signed   By: Margaretha Sheffield MD   On: 06/30/2020 13:15   Recent Labs    06/30/20 1031  WBC 13.8*  HGB 11.2*  HCT 34.9*  PLT 154   Recent Labs    06/30/20 1125 07/01/20 0106  NA 132* 130*  K 4.0 4.0  CL 101 101  CO2 21* 20*  GLUCOSE 123* 122*  BUN 36* 34*  CREATININE 2.21* 1.54*  CALCIUM 9.3 8.7*    Intake/Output Summary (Last 24 hours) at 07/01/2020 1807 Last data filed at 07/01/2020 1750 Gross per 24 hour  Intake 1150 ml  Output --  Net 1150 ml         Physical Exam: BP (!) 102/56 (BP Location: Right Arm)   Pulse 62   Temp 98.2 F (36.8 C) (Oral)   Resp 19   Ht 5\' 7"  (1.702 m)   Wt 83.2 kg   SpO2 100%   BMI 28.73 kg/m  Constitutional: No distress . Vital signs reviewed. HENT: Normocephalic.  Atraumatic. Eyes: EOMI. No discharge. Cardiovascular: No JVD.  RRR. Respiratory: Normal effort.  No stridor.  Bilateral clear to auscultation. GI: Non-distended.  BS +. Skin: Warm and dry.  Intact. Psych: Normal mood.  Normal behavior. Musc: Right wrist with significant tenderness.  No edema, erythema, warmth No tenderness in any other joints. Neuro: Alert Mild expressive aphasia and dysarthria, unchanged Right facial droop.  Motor: LUE/LLE: 5/5 proximal to distal RUE: 2+/5 proximal to distal with ataxia (? Pain inhibition), unchanged RLE: 2+/5 proximal to distal  Assessment/Plan: 1. Functional deficits which require 3+ hours per day of interdisciplinary therapy in a comprehensive inpatient rehab setting.  Physiatrist is providing close team supervision and 24 hour management of active medical problems listed below.  Physiatrist and rehab team continue to assess barriers to discharge/monitor patient progress toward functional and medical goals   Care Tool:  Bathing    Body parts bathed by patient: Abdomen, Chest, Front perineal area, Left upper leg, Right upper leg, Face, Right arm   Body parts bathed by helper: Left arm, Buttocks, Right lower leg, Left lower leg, Face     Bathing assist Assist Level: Maximal Assistance - Patient 24 - 49%     Upper Body Dressing/Undressing  Upper body dressing   What is the patient wearing?: Pull over shirt    Upper body assist Assist Level: Total Assistance - Patient < 25%    Lower Body Dressing/Undressing Lower body dressing      What is the patient wearing?: Pants, Incontinence brief     Lower body assist Assist for lower body dressing: Total Assistance - Patient < 25%      Toileting Toileting Toileting Activity did not occur Landscape architect and hygiene only): N/A (no void or bm)  Toileting assist       Transfers Chair/bed transfer  Transfers assist  Chair/bed transfer activity did not occur: Safety/medical concerns  Chair/bed transfer assist level: Total Assistance - Patient < 25% (with beezy board)     Locomotion Ambulation   Ambulation assist   Ambulation activity did not occur: Safety/medical concerns          Walk 10 feet activity   Assist  Walk 10 feet activity did not occur: Safety/medical concerns        Walk 50 feet activity   Assist Walk 50 feet with 2 turns activity did not occur: Safety/medical concerns         Walk 150 feet activity   Assist Walk 150 feet activity did not occur: Safety/medical concerns         Walk 10 feet on uneven surface  activity   Assist Walk 10 feet on uneven surfaces activity did not occur: Safety/medical concerns         Wheelchair     Assist Will patient use wheelchair at discharge?: No             Wheelchair 50 feet with 2 turns activity    Assist            Wheelchair 150 feet activity     Assist      Assist Level: Independent    Medical Problem List and Plan: 1.  Impaired mobility and ADLs secondary to acute CVA with left midbrain infarct  Continue CIR  Repeat head CT ordered given Weakness and autonomic changes. 2.  Antithrombotics: -DVT/anticoagulation:  Pharmaceutical: Lovenox             -antiplatelet therapy: Aspirin 3. Pain Management: Tylenol prn.   Gabapentin 100 3 times daily started 11/26 for?  Neuropathic pain post stroke, decreased to nightly on 11/28 due to? Side effects, with improvement  Right wrist pain-x-ray ordered, uric acid ordered   Empiric prednisone started for gout 4. Mood: Continue Celexa 20mg  daily for depression 5. Neuropsych: This patient is not fully capable of making decisions on his own  behalf. 6. Skin/Wound Care: ZioPatch in place, dry skin- added Eucerin cream 7. Fluids/Electrolytes/Nutrition: Monitor I/Os. Encourage fluid intake  8. HTN: Monitor BP   Decrease Amlodipine to 5mg , DC'd on 11/28.   Continue Lisinopril 40mg  daily, decreased to 10 on 11/28  Soft on 11/28  Monitor with increased mobility 9. Tobacco abuse: Provide counseling  10. Tachycardia/beats of NSVT: Zio patch in place.   Heart rate controlled on 11/28  Monitor with increased activity 11. AKI  Creatinine 1.54 on 11/28, labs ordered for tomorrow  IVF initiated on 11/27, DCed  Encourage fluids 12. ABLA  Hb 11.2 on 11/27, labs ordered for tomorrow  Cont to monitor 13.  Leukocytosis  WBCs 13.8 on 11/27  UA/Ucx ordered  CXR ordered  Afebrile  See #3 14.  Hyponatremia  Sodium 130 on 11/28, labs ordered for tomorrow 15.  Post stroke  dysphagia  D2 thins  Advance diet as tolerated  LOS: 4 days A FACE TO FACE EVALUATION WAS PERFORMED  Tashe Purdon Lorie Phenix 07/01/2020, 6:07 PM

## 2020-07-01 NOTE — Progress Notes (Signed)
Bladder scan volume was 480 ml. Patient declined in/out cath, states he would prefer to wait and see if void occurs within next hour or so. Education provided regarding intermittent catheterization.

## 2020-07-01 NOTE — Progress Notes (Signed)
Speech Language Pathology Daily Session Note  Patient Details  Name: Andres White MRN: [id number] Date of Birth: May 07, 1946  Today's Date: 07/01/2020 SLP Individual Time: 1100-1130 SLP Individual Time Calculation (min): 30 min  Short Term Goals: Week 1: SLP Short Term Goal 1 (Week 1): Pt will demonstrate ability to detect functional errors within tasks with Mod A verbal/visual cues. SLP Short Term Goal 2 (Week 1): Pt will recall new and/or daily information with Mod A for use of compensatory strategies or aids. SLP Short Term Goal 3 (Week 1): Pt will recall 2 safety precuations with Mod A cues for use of aids or strategies. SLP Short Term Goal 4 (Week 1): Pt will demonstrate ability to problem solving basic to mildly complex functional tasks with Mod A verbal/visual cues. SLP Short Term Goal 5 (Week 1): Patient will consume current diet with minimal overt s/s of aspiration with supervision verbal cues for use of swallowing compensatory strategies. SLP Short Term Goal 6 (Week 1): Patient will demonstrate efficient mastication and complete oral clearance without overt s/s of aspiration with trials of Dys. 3 textures over 2 sessions prior to upgrade.  Skilled Therapeutic Interventions: Pt seen for skilled treatment of cognitive linguistic goals. Pt lethargic this date after OT session, however agreeable to therapeutic tasks. Patient states month is "January or March" and year is "41 or '[birth year]" and states reason for hospitalization "here at [facility]" is because he has "knee accidents". SLP and patient completing November calendar to increase temporal orientation as well as recent medical events and hospitalization timeline. Pt benefiting from Max A verbal and visual cues to carryover learned information within treatment session, likely complicated by lethargy. Pt requiring frequent rest breaks and repetition of prompts during session. Pt left elevated in bed with alarm on and all needs met. Cont ST POC.    Pain Pain Assessment Pain Scale: 0-10 Pain Score: 0-No pain Pain Type: Acute pain Pain Location: Foot Pain Orientation: Right;Left Pain Intervention(s): Medication (See eMAR)  Therapy/Group: Individual Therapy  Dewaine Conger 07/01/2020, 11:51 AM

## 2020-07-01 NOTE — Progress Notes (Signed)
Speech Language Pathology Daily Session Note  Patient Details  Name: Andres White MRN: [id number] Date of Birth: 1946-02-09  Today's Date: 07/01/2020 SLP Individual Time: 1500-1527 SLP Individual Time Calculation (min): 27 min  Short Term Goals: Week 1: SLP Short Term Goal 1 (Week 1): Pt will demonstrate ability to detect functional errors within tasks with Mod A verbal/visual cues. SLP Short Term Goal 2 (Week 1): Pt will recall new and/or daily information with Mod A for use of compensatory strategies or aids. SLP Short Term Goal 3 (Week 1): Pt will recall 2 safety precuations with Mod A cues for use of aids or strategies. SLP Short Term Goal 4 (Week 1): Pt will demonstrate ability to problem solving basic to mildly complex functional tasks with Mod A verbal/visual cues. SLP Short Term Goal 5 (Week 1): Patient will consume current diet with minimal overt s/s of aspiration with supervision verbal cues for use of swallowing compensatory strategies. SLP Short Term Goal 6 (Week 1): Patient will demonstrate efficient mastication and complete oral clearance without overt s/s of aspiration with trials of Dys. 3 textures over 2 sessions prior to upgrade.  Skilled Therapeutic Interventions: Pt seen for skilled treatment of swallowing goals. Pt remains very lethargic this date, agreeable to raise [facility] for limited diet trials. Pt tolerating 3 bites Dys 2 snack provided Mod A verbal cues to maintain alertness for efficient mastication. Pt required liquid assistance to aid in bolus prep and transit, swallow initiation appeared timely. Mod diffuse oral stasis after all trials, effectively cleared with ice chip and/or thin liquid wash. Pt requesting to halt trials d/t lethargy and wanting to go back to sleep, provided education re: role of SLP, goals of tx, current dietary restrictions and recommended swallow strategies. Pt verbalized understanding. Pt left in bed with alarm set, all needs within reach. Cont ST  POC.   Pain Pain Assessment Pain Scale: 0-10 Pain Score: 0-No pain  Therapy/Group: Individual Therapy  Dewaine Conger 07/01/2020, 3:19 PM

## 2020-07-01 NOTE — Progress Notes (Signed)
Occupational Therapy Session Note  Patient Details  Name: Andres White MRN: [id number] Date of Birth: [birth year]  Today's Date: 07/01/2020 OT Individual Time: 1000-1045 OT Individual Time Calculation (min): 45 min    Short Term Goals: Week 1:  OT Short Term Goal 1 (Week 1): Pt will complete sit<>stand with max A of 1 in preparation for BADL tasks OT Short Term Goal 2 (Week 1): Pt will complete toilet transfer with max A OT Short Term Goal 3 (Week 1): Pt will perform LB dressing with mod A OT Short Term Goal 4 (Week 1): Pt will perform UB dressing with min A  Skilled Therapeutic Interventions/Progress Updates:    Pt received sitting in the w/c with no c/o at rest, but stating his feet have been hurting when attempting to bear any weight through them. Because of this, pt was unwilling to attempt any out of chair activity or to leave room. Pt agreeable to RUE NMR in chair. Pt with very painful shoulder with any hand/wrist, etc AROM/movement. Pt also with significant bracing/compensation 2/2 pain. Pt was guided through hand grasp and release movements with hand support in elevation to promote fluid reabsorption (hand and wrist with obvious edema). Pt required frequent cueing throughout session to maintain attention to RUE. Pt then held a styrofoam cup in his R hand and with min facilitation completed functional hand to mouth blocked practice with elbow stabilized. Pt's bracelets were pressing into his wrist with indention so they were cut off to ensure fluid return was possible and for skin integrity. Using the UE ranger pt had improvement in pain and he was able to completed x15 blocked practice shoulder extension to 10 degrees and flexion to 25 degrees with min stabilization provided at the ranger. Improved visual attention to RUE as well. NT entered room to inform OT pt slid out of chair earlier in the day and was requesting to be back in bed at end of session. Pt completed a slideboard transfer  using the beasy board with max A, with +2 present for safety. Pt was positioned in bed to support the RUE and left with all needs met, bed alarm set.   Therapy Documentation Precautions:  Precautions Precautions: Fall Precaution Comments: watch HR Restrictions Weight Bearing Restrictions: No   Therapy/Group: Individual Therapy  Curtis Sites 07/01/2020, 10:15 AM

## 2020-07-01 NOTE — Progress Notes (Signed)
Physical Therapy Session Note  Patient Details  Name: Andres White MRN: [id number] Date of Birth: [birth year]  Today's Date: 07/01/2020 PT Individual Time: 0800-0910 PT Individual Time Calculation (min): 70 min   Short Term Goals: Week 1:  PT Short Term Goal 1 (Week 1): Pt will perform bed mobility with CGA. PT Short Term Goal 2 (Week 1): Pt wil perform sit to stand with modA. PT Short Term Goal 3 (Week 1): Pt will perform bed to chair transfer with minA. PT Short Term Goal 4 (Week 1): Pt will ambulate 100' with minA and LRAD.  Skilled Therapeutic Interventions/Progress Updates:     Patient in bed asleep upon PT arrival. Patient easily aroused and agreeable to PT session. Patient reported feeling "unwell" at beginning of session and reported 8-9/10 B ankle/foot pain and L upper extremity pain with moiblity during session, RN made aware. PT provided repositioning, rest breaks, and distraction as pain interventions throughout session.   Vitals: BP: 98/60 HR: 66 SPO2: 97% Temp: 98.5 deg F  Therapeutic Activity: Bed Mobility: Patient performed rolling R/L with min A to change soiled brief with total A. He performed supine to/from sit with mod A x2 in a flat bed without use of bed rail. Provided verbal cues for rolling to L side-lying, cues for bringing L arm across to roll, then using his L arm to push up to his elbow then his hand to come to sitting. Patient attempted transfers, see below, and was unable without additional equipment. Returned to supine to retreive equipment and paper scrubs for patient. Donned pants with total A for energy/time management in supine and shirt with mod-max A with patient sitting EOB. Provided cues for hemi-technique throughout. Patient sat EOB with supervision for balance this session.  Transfers: Patient attempted sit to/from stand x2 with bed elevated using RW, patient unable to shift his weight into his feet to boost up due to B foot/ankle pain. Attempted  squat pivot x2 with the same difficulty of shifting his weight forward into weight bearing. Provided verbal cues for forward weight shift, hand placement, and improved breath support for pain management without improved success. Retreived [employer] board and patient performed transfer bed>w/c with total A, provided cues for board placement and technique. Patient continues to limit forward weight shift due to B foot/ankle pain. Once in w/c, performed reciprocal scooting x2 with max A for improved positioning in the chair for safety. Provided patient with elevating leg rests and R lap tray for improved positioning and reduced pain with lower extremity elevation. Patient stated that he would like to stay sitting up out of the bed. RN informed and instructed to provide frequent supervision with patient in the chair, noted patient's breakfast tray untouched and recommended NT provide supervision for eating during this time, until OT session in 1 hour. RN stated understanding.   Patient in w/c at end of session with breaks locked, seat belt alarm set, and all needs within reach.    Therapy Documentation Precautions:  Precautions Precautions: Fall Precaution Comments: watch HR Restrictions Weight Bearing Restrictions: No   Therapy/Group: Individual Therapy  Tyrees Chopin L Shayn Madole PT, DPT  07/01/2020, 3:57 PM

## 2020-07-02 ENCOUNTER — Inpatient Hospital Stay (HOSPITAL_COMMUNITY): Payer: Medicare Other | Admitting: Occupational Therapy

## 2020-07-02 ENCOUNTER — Inpatient Hospital Stay (HOSPITAL_COMMUNITY): Payer: Medicare Other

## 2020-07-02 ENCOUNTER — Inpatient Hospital Stay (HOSPITAL_COMMUNITY): Payer: Medicare Other | Admitting: Speech Pathology

## 2020-07-02 DIAGNOSIS — I639 Cerebral infarction, unspecified: Secondary | ICD-10-CM | POA: Diagnosis not present

## 2020-07-02 LAB — BASIC METABOLIC PANEL
Anion gap: 10 (ref 5–15)
BUN: 25 mg/dL — ABNORMAL HIGH (ref 8–23)
CO2: 18 mmol/L — ABNORMAL LOW (ref 22–32)
Calcium: 8.8 mg/dL — ABNORMAL LOW (ref 8.9–10.3)
Chloride: 104 mmol/L (ref 98–111)
Creatinine, Ser: 1.16 mg/dL (ref 0.61–1.24)
GFR, Estimated: >60 mL/min (ref >60)
Glucose, Bld: 138 mg/dL — ABNORMAL HIGH (ref 70–99)
Potassium: 4.8 mmol/L (ref 3.5–5.1)
Sodium: 132 mmol/L — ABNORMAL LOW (ref 135–145)

## 2020-07-02 LAB — CBC
HCT: 29.4 % — ABNORMAL LOW (ref 39.0–52.0)
Hemoglobin: 9.6 g/dL — ABNORMAL LOW (ref 13.0–17.0)
MCH: 30.5 pg (ref 26.0–34.0)
MCHC: 32.7 g/dL (ref 30.0–36.0)
MCV: 93.3 fL (ref 80.0–100.0)
Platelets: 176 10*3/uL (ref 150–400)
RBC: 3.15 MIL/uL — ABNORMAL LOW (ref 4.22–5.81)
RDW: 12.4 % (ref 11.5–15.5)
WBC: 8.6 10*3/uL (ref 4.0–10.5)
nRBC: 0 % (ref 0.0–0.2)

## 2020-07-02 LAB — URINALYSIS, COMPLETE (UACMP) WITH MICROSCOPIC
Bilirubin Urine: NEGATIVE
Glucose, UA: NEGATIVE mg/dL
Hgb urine dipstick: NEGATIVE
Ketones, ur: NEGATIVE mg/dL
Leukocytes,Ua: NEGATIVE
Nitrite: NEGATIVE
Protein, ur: NEGATIVE mg/dL
Specific Gravity, Urine: 1.015 (ref 1.005–1.030)
pH: 5 (ref 5.0–8.0)

## 2020-07-02 LAB — URIC ACID: Uric Acid, Serum: 7 mg/dL (ref 3.7–8.6)

## 2020-07-02 NOTE — Progress Notes (Signed)
Occupational Therapy Session Note  Patient Details  Name: Andres White MRN: [id number] Date of Birth: 07/18/1946  Today's Date: 07/02/2020 OT Individual Time: 0945-1100 OT Individual Time Calculation (min): 75 min    Short Term Goals: Week 1:  OT Short Term Goal 1 (Week 1): Pt will complete sit<>stand with max A of 1 in preparation for BADL tasks OT Short Term Goal 2 (Week 1): Pt will complete toilet transfer with max A OT Short Term Goal 3 (Week 1): Pt will perform LB dressing with mod A OT Short Term Goal 4 (Week 1): Pt will perform UB dressing with min A  Skilled Therapeutic Interventions/Progress Updates:   Pt greeted semi-reclined in bed asleep, easy to wake and agreeable to OT treatment session. Pt noted to have been incontinent of urine with brief and bed soaked through. Pt able to assist with washing peri-area, then needed total A for peri-care and brief change. Total  A to thread pants, then daughter entered room and pt able to bridge all the way up to allow OT to pull pants over hips. Min A to come to sitting EOB. Attempted sit<>stand 2x with just OT, but pt would not power up at all. Daughter stood in front of pt and held RW, then pt pulled up on RW and was able to stand with max A. Pt took side steps along EOB max A. Used Stedy with max of 1 to stand. Pt brought to the sink in stedy and UB bathing completed from [facility] with focus on upright posture and core strength. OT also removed R hand wrist cock-up splint as splint was causing edema and was not fitting correctly. [employer] forced use of R UE during BADL tasks with pt able to maintain grip on wash cloth and was under L arm. Shaving completed sitting at the sink with pt able to use R hand to spread shaving cream and L hand to shave. Mod/max A for UB dressing with education for hemi dressing techniques. Pt left seated in wc with 1/2 lap tray and R UE elevated. Chair alarm belt on and needs met.    Therapy  Documentation Precautions:  Precautions Precautions: Fall Precaution Comments: watch HR Restrictions Weight Bearing Restrictions: No Pain:   Pt reports pain in B feet and R wrist - aching pain, no number given. Rest and repositioned for comfort   Therapy/Group: Individual Therapy  Valma Cava 07/02/2020, 11:05 AM

## 2020-07-02 NOTE — Progress Notes (Signed)
[place] PHYSICAL MEDICINE & REHABILITATION PROGRESS NOTE  Subjective/Complaints: No complaints this morning. Denies pain, constipation, insomnia.   ROS:  Denies CP, SOB, N/V/D  Objective: Vital Signs: Blood pressure 123/73, pulse 64, temperature 97.8 F (36.6 C), temperature source Oral, resp. rate 19, height 5\' 7"  (1.702 m), weight 83.2 kg, SpO2 99 %. DG Chest 2 View  Result Date: 07/01/2020 CLINICAL DATA:  Leukocytosis EXAM: CHEST - 2 VIEW COMPARISON:  11/16/2017 FINDINGS: Cardiac shadow is within normal limits. External monitoring device is noted over the left chest. The lungs are clear bilaterally. No acute bony abnormality is seen. IMPRESSION: No active cardiopulmonary disease. Electronically Signed   By: Inez Catalina M.D.   On: 07/01/2020 21:58   DG Wrist Complete Right  Result Date: 07/01/2020 CLINICAL DATA:  Right wrist pain, no known injury, initial encounter EXAM: RIGHT WRIST - COMPLETE 3+ VIEW COMPARISON:  None. FINDINGS: Mild degenerative changes are noted at the radiocarpal joint. No acute fracture or dislocation is seen. Old healed fracture in the fifth metacarpal is noted. Degenerative changes in the first MCP joint and CMC joint are noted as well. Diffuse vascular calcifications are seen. No soft tissue abnormality is noted. IMPRESSION: Degenerative change of the wrist without acute abnormality. Electronically Signed   By: Inez Catalina M.D.   On: 07/01/2020 21:59   CT HEAD WO CONTRAST  Result Date: 06/30/2020 CLINICAL DATA:  Stroke follow-up. EXAM: CT HEAD WITHOUT CONTRAST TECHNIQUE: Contiguous axial images were obtained from the base of the skull through the vertex without intravenous contrast. COMPARISON:  MRI and CT June 24, 2020. FINDINGS: Brain: Mild edema associated with the known left midbrain infarcts. No evidence of acute hemorrhage or substantial mass effect. Similar chronic infarcts involving the left frontal lobe, right parietal temporal lobe, bilateral  cerebellar hemispheres, and bilateral thalami. Patchy white matter hypoattenuation, likely related to chronic microvascular ischemic disease. No hydrocephalus. Vascular: Calcific atherosclerosis. Skull: No acute fracture. Sinuses/Orbits: No acute finding. Other: No mastoid effusions. IMPRESSION: Mild edema associated with the known left midbrain infarcts. No evidence of acute hemorrhage or substantial mass effect. Electronically Signed   By: Margaretha Sheffield MD   On: 06/30/2020 13:15   Recent Labs    06/30/20 1031 07/02/20 0710  WBC 13.8* 8.6  HGB 11.2* 9.6*  HCT 34.9* 29.4*  PLT 154 176   Recent Labs    07/01/20 0106 07/02/20 0710  NA 130* 132*  K 4.0 4.8  CL 101 104  CO2 20* 18*  GLUCOSE 122* 138*  BUN 34* 25*  CREATININE 1.54* 1.16  CALCIUM 8.7* 8.8*    Intake/Output Summary (Last 24 hours) at 07/02/2020 1052 Last data filed at 07/02/2020 0700 Gross per 24 hour  Intake 660 ml  Output 425 ml  Net 235 ml        Physical Exam: BP 123/73 (BP Location: Left Arm)   Pulse 64   Temp 97.8 F (36.6 C) (Oral)   Resp 19   Ht 5\' 7"  (1.702 m)   Wt 83.2 kg   SpO2 99%   BMI 28.73 kg/m  Gen: no distress, normal appearing HEENT: oral mucosa pink and moist, NCAT Cardio: Reg rate Chest: normal effort, normal rate of breathing Abd: soft, non-distended Ext: no edema Skin: intact Psych: Normal mood.  Normal behavior. Musc: Right wrist with significant tenderness.  No edema, erythema, warmth No tenderness in any other joints. Neuro: Alert Mild expressive aphasia and dysarthria, unchanged Right facial droop.  Motor: LUE/LLE: 5/5 proximal to  distal RUE: 2+/5 proximal to distal with ataxia (? Pain inhibition), unchanged RLE: 2+/5 proximal to distal  Assessment/Plan: 1. Functional deficits which require 3+ hours per day of interdisciplinary therapy in a comprehensive inpatient rehab setting.  Physiatrist is providing close team supervision and 24 hour management of active  medical problems listed below.  Physiatrist and rehab team continue to assess barriers to discharge/monitor patient progress toward functional and medical goals   Care Tool:  Bathing    Body parts bathed by patient: Abdomen, Chest, Front perineal area, Left upper leg, Right upper leg, Face, Right arm   Body parts bathed by helper: Left arm, Buttocks, Right lower leg, Left lower leg, Face     Bathing assist Assist Level: Maximal Assistance - Patient 24 - 49%     Upper Body Dressing/Undressing Upper body dressing   What is the patient wearing?: Pull over shirt    Upper body assist Assist Level: Total Assistance - Patient < 25%    Lower Body Dressing/Undressing Lower body dressing      What is the patient wearing?: Pants, Incontinence brief     Lower body assist Assist for lower body dressing: Total Assistance - Patient < 25%     Toileting Toileting Toileting Activity did not occur Landscape architect and hygiene only): N/A (no void or bm)  Toileting assist       Transfers Chair/bed transfer  Transfers assist  Chair/bed transfer activity did not occur: Safety/medical concerns  Chair/bed transfer assist level: Total Assistance - Patient < 25% (with beezy board)     Locomotion Ambulation   Ambulation assist   Ambulation activity did not occur: Safety/medical concerns          Walk 10 feet activity   Assist  Walk 10 feet activity did not occur: Safety/medical concerns        Walk 50 feet activity   Assist Walk 50 feet with 2 turns activity did not occur: Safety/medical concerns         Walk 150 feet activity   Assist Walk 150 feet activity did not occur: Safety/medical concerns         Walk 10 feet on uneven surface  activity   Assist Walk 10 feet on uneven surfaces activity did not occur: Safety/medical concerns         Wheelchair     Assist Will patient use wheelchair at discharge?: No             Wheelchair 50  feet with 2 turns activity    Assist            Wheelchair 150 feet activity     Assist      Assist Level: Independent    Medical Problem List and Plan: 1.  Impaired mobility and ADLs secondary to acute CVA with left midbrain infarct  Conitnue CIR  Repeat head CT ordered 11/27 given Weakness and autonomic changes. Shows mild edema associated with the known left midbrain infarcts. No evidence of acute hemorrhage or substantial mass effect. 2.  Antithrombotics: -DVT/anticoagulation:  Pharmaceutical: Lovenox             -antiplatelet therapy: Aspirin 3. Pain Management: Tylenol prn.   Gabapentin 100 3 times daily started 11/26 for?  Neuropathic pain post stroke, decreased to nightly on 11/28 due to? Side effects, with improvement  Right wrist pain-x-ray ordered, uric acid ordered   Empiric prednisone started for gout  11/29: improved 4. Mood: Continue Celexa 20mg  daily for depression 5.  Neuropsych: This patient is not fully capable of making decisions on his own behalf. 6. Skin/Wound Care: ZioPatch in place, dry skin- added Eucerin cream 7. Fluids/Electrolytes/Nutrition: Monitor I/Os. Encourage fluid intake  8. HTN: Monitor BP   Decrease Amlodipine to 5mg , DC'd on 11/28.   Continue Lisinopril 40mg  daily, decreased to 10 on 11/28  Well controlled 11/29  Monitor with increased mobility 9. Tobacco abuse: Provide counseling  10. Tachycardia/beats of NSVT: Zio patch in place.   HR well controlled 11/29  Monitor with increased activity 11. AKI  Creatinine 1.54 on 11/28, down to 1.19 on 11/29  IVF initiated on 11/27, DCed  Encourage fluids 12. ABLA  Hb 11.2 on 11/27, labs ordered for tomorrow  Cont to monitor 13.  Leukocytosis  WBCs 13.8 on 11/27, down to 8/6 on 11/29.   UA equivocal, UC pending  CXR normal  Afebrile  See #3 14.  Hyponatremia  Sodium 130 on 11/28, 132 on 11/29 15.  Post stroke dysphagia  D2 thins  Advance diet as tolerated  LOS: 5 days A FACE  TO FACE EVALUATION WAS PERFORMED  Andres White P Andres White 07/02/2020, 10:52 AM

## 2020-07-02 NOTE — Care Management (Signed)
Inpatient [facility] Individual Statement of Services  Patient Name:  Andres White  Date:  07/02/2020  Welcome to the [facility].  Our goal is to provide you with an individualized program based on your diagnosis and situation, designed to meet your specific needs.  With this comprehensive rehabilitation program, you will be expected to participate in at least 3 hours of rehabilitation therapies Monday-Friday, with modified therapy programming on the weekends.  Your rehabilitation program will include the following services:  Physical Therapy (PT), Occupational Therapy (OT), Speech Therapy (ST), 24 hour per day rehabilitation nursing, Therapeutic Recreaction (TR), Psychology, Neuropsychology, Care Coordinator, Rehabilitation Medicine, Nutrition Services, Pharmacy Services and Other  Weekly team conferences will be held on Tuesdays to discuss your progress.  Your Inpatient Rehabilitation Care Coordinator will talk with you frequently to get your input and to update you on team discussions.  Team conferences with you and your family in attendance may also be held.  Expected length of stay: 17-21 days    Overall anticipated outcome: Supervision  Depending on your progress and recovery, your program may change. Your Inpatient Rehabilitation Care Coordinator will coordinate services and will keep you informed of any changes. Your Inpatient Rehabilitation Care Coordinator's name and contact numbers are listed  below.  The following services may also be recommended but are not provided by the [facility] will be made to provide these services after discharge if needed.  Arrangements include referral to agencies that provide these services.  Your insurance has been verified to be:  [facility] Medicare  Your  primary doctor is:  Harrell Gave Klipstein  Pertinent information will be shared with your doctor and your insurance company.  Inpatient Rehabilitation Care Coordinator:  Cathleen Corti [phone] or [phone]  Information discussed with and copy given to patient by: Rana Snare, 07/02/2020, 12:01 PM

## 2020-07-02 NOTE — Progress Notes (Signed)
Physical Therapy Session Note  Patient Details  Name: Andres White MRN: [id number] Date of Birth: 1946/01/14  Today's Date: 07/02/2020 PT Individual Time: 1346-1446 PT Individual Time Calculation (min): 60 min   Short Term Goals: Week 1:  PT Short Term Goal 1 (Week 1): Pt will perform bed mobility with CGA. PT Short Term Goal 2 (Week 1): Pt wil perform sit to stand with modA. PT Short Term Goal 3 (Week 1): Pt will perform bed to chair transfer with minA. PT Short Term Goal 4 (Week 1): Pt will ambulate 100' with minA and LRAD.  Skilled Therapeutic Interventions/Progress Updates:     Pt received seated in [facility] with daughter present. Pt is agreeable to therapy and daughter provides encouragement to participate with maximal effort. Pt complains of tingling pain in bilateral feet. Number not provided. PT provides rest breaks and mobility to manage pain symptoms. WC transport to gym for time management. Sit to stand to RW with modA. PT cues for hand placement, body mechanics, upright posture, extension of hips, and increased proximity to RW for safety. Pt ambulates 5' with very antalgic gait pattern and is guided to mat table for rest break. Pt then performs multiple reps of sit to stand from mat to RW, and marches in place with CGA from PT, with tactile cues for posture. Pt audibly short of breath during rest breaks and PT checks vitals, O2 at 100% and HR ~120 following each bout of marching in place. Pt given rest break until HR <100.  Pt then ambulates 47' with EVA walker and minA/modA from PT. Following rest break, pt ambulates 53' with RW and improved gait pattern relative to initial antalgic pattern, with PT providing modA for stability and RW management/safety.   Pt performs sidestepping in parallel bars with mirror for visual feedback. Sidesteps 5' in each direction with CGA form PT. Pt verbalizes significant fatigue following sidesteps and requests to return to room. Pt misses 15 minutes of  skilled PT due to fatigue. Left seated with R lap tray in place, alarm intact, and all needs within reach.  Therapy Documentation Precautions:  Precautions Precautions: Fall Precaution Comments: watch HR Restrictions Weight Bearing Restrictions: No General: PT Amount of Missed Time (min): 15 Minutes PT Missed Treatment Reason: Patient fatigue   Therapy/Group: Individual Therapy  Breck Coons, PT, DPT 07/02/2020, 3:49 PM

## 2020-07-02 NOTE — Progress Notes (Signed)
Speech Language Pathology Daily Session Note  Patient Details  Name: Andres White MRN: [id number] Date of Birth: [birth year]  Today's Date: 07/02/2020 SLP Individual Time: 0725-0820 SLP Individual Time Calculation (min): 55 min  Short Term Goals: Week 1: SLP Short Term Goal 1 (Week 1): Pt will demonstrate ability to detect functional errors within tasks with Mod A verbal/visual cues. SLP Short Term Goal 2 (Week 1): Pt will recall new and/or daily information with Mod A for use of compensatory strategies or aids. SLP Short Term Goal 3 (Week 1): Pt will recall 2 safety precuations with Mod A cues for use of aids or strategies. SLP Short Term Goal 4 (Week 1): Pt will demonstrate ability to problem solving basic to mildly complex functional tasks with Mod A verbal/visual cues. SLP Short Term Goal 5 (Week 1): Patient will consume current diet with minimal overt s/s of aspiration with supervision verbal cues for use of swallowing compensatory strategies. SLP Short Term Goal 6 (Week 1): Patient will demonstrate efficient mastication and complete oral clearance without overt s/s of aspiration with trials of Dys. 3 textures over 2 sessions prior to upgrade.  Skilled Therapeutic Interventions: Skilled treatment session focused on dysphagia and cognitive goals. SLP facilitated session by providing skilled observation with breakfast meal of Dys. 2 textures with thin liquids. Patient consumed meal without overt s/s of aspiration. However, mildly prolonged mastication with oral residue was present with solid textures but patient able to spontaneously clear with a liquid wash. Patient with minimal PO intake despite encouragement. Recommend patient continue current diet. SLP also facilitated session by providing Min A verbal cues to count money and Max A multimodal cues for functional problem solving during basic mathematical problems while making change. Patient left upright in bed with alarm on and all needs  within reach. Continue with current plan of care.      Pain No/Denies Pain   Therapy/Group: Individual Therapy  Camrin Lapre, [place] 07/02/2020, 1:22 PM

## 2020-07-03 ENCOUNTER — Inpatient Hospital Stay (HOSPITAL_COMMUNITY): Payer: Medicare Other

## 2020-07-03 ENCOUNTER — Inpatient Hospital Stay (HOSPITAL_COMMUNITY): Payer: Medicare Other | Admitting: Occupational Therapy

## 2020-07-03 ENCOUNTER — Inpatient Hospital Stay (HOSPITAL_COMMUNITY): Payer: Medicare Other | Admitting: Speech Pathology

## 2020-07-03 DIAGNOSIS — I639 Cerebral infarction, unspecified: Secondary | ICD-10-CM | POA: Diagnosis not present

## 2020-07-03 NOTE — Patient Care Conference (Signed)
Inpatient RehabilitationTeam Conference and Plan of Care Update Date: 07/03/2020   Time: 10:37 AM    Patient Name: Andres White      Medical Record Number: [id number]  Date of Birth: 02-Oct-1945 Sex: Male         Room/Bed: 4W25C/4W25C-01 Payor Info: Payor: [place] / Plan: [employer] MEDICARE / Product Type: *No Product type* /    Admit Date/Time:  06/27/2020  1:15 PM  Primary Diagnosis:  CVA (cerebral vascular accident) [facility])  Hospital Problems: Principal Problem:   CVA (cerebral vascular accident) ([place]) Active Problems:   Hypertension   Faintness   Chest discomfort   Left carotid stenosis   Right leg weakness   Stroke (cerebrum) (HCC)   Brainstem infarct, acute (HCC)   Acute blood loss anemia   AKI (acute kidney injury) ([place])   Tachycardia   Dysesthesia   Neuropathic pain   Drug-induced hypotension   Leukocytosis   Hyponatremia   History of hypertension   Dysphagia, post-stroke    Expected Discharge Date: Expected Discharge Date: 07/18/20  Team Members Present: Physician leading conference: Dr. Leeroy Cha Care Coodinator Present: Loralee Pacas, LCSWA;Xayvion Shirah Creig Hines, RN, BSN, [place] Nurse Present: Other (comment) Philip Aspen, RN) PT Present: Tereasa Coop, PT OT Present: Cherylynn Ridges, OT SLP Present: Weston Anna, SLP PPS Coordinator present : Ileana Ladd, Burna Mortimer, SLP     Current Status/Progress Goal Weekly Team Focus  Bowel/Bladder   Continent of B/B lbm 07/02/20  Maintain continence  QS/PRN assessment of toileting needs   Swallow/Nutrition/ Hydration   Dys. 2 textures with thin liquids, Supervision  Mod I  use of swallowing strategies, trials of Dys. 3 textures   ADL's   Max/total A overall, was able to [facility] transfer today with max A  Supervision/CGA  transfers, sit<>stand, activity tolerance, R NMR, sitting and standing balance, self-care retraining   Mobility   mod(A) bed mobility, modA sit to stand and gait 50' with  RW. Fluctuating presentation depending on pain in Bilateral feet  Supervision  consistency of performance, upright tolerance, ambulation, balance, safety, R hemibody NMR   Communication             Safety/Cognition/ Behavioral Observations  Mod-Max A  Supervision  orientation, basic problem solving   Pain   patient denies pain  < 3  QS/PRN assessment   Skin   skin intact  maintain skin integrity  Assess skin QS/PRN ,address ant new skin issues/concerns     Discharge Planning:  D/c to home with his daughter. Pt will ahve 24/7 support from various family members (daughter and her children).   Team Discussion: Continent B/B, pain has improved in hand. OT reports patient is a max assist to stand. Has supervision to contact guard goals. PT reports patient's complaints of pain decreased once he was up and moving. Mod assist for bed mobility, mod assist sit to stand, and gait 50'. He has supervision goals. SLP reports patient is on DYS 2, thin liquids, has poor memory, and poor problem solving. Goals are Mod I. Patient on target to meet rehab goals: yes, with the 24/7 support that his family will be able to provide.  *See Care Plan and progress notes for long and short-term goals.   Revisions to Treatment Plan:  Not at this time.  Teaching Needs: Continue with family education.  Current Barriers to Discharge: Medical stability and pain, dysphagia, edema, memory.  Possible Resolutions to Barriers: Continue current medications, elevate hand, continue hand splint when appropriate,  provide emotional support to family and patient.     Medical Summary Current Status: BM yesterday, edema and pain in hand is improving, hyponatremia, dysphagia, impaired memory, anemia  Barriers to Discharge: Medical stability  Barriers to Discharge Comments: Left eye pain, blurry vision, CMC joint pain, hyponatremia, dysphagia, impaired memory, Hgb 9.6 Possible Resolutions to [employer] Focus:  Neuro-opthalmology follow-up, monitor pain in [facility] joint, monitor Na and Hgb weekly, D2 diet, problem solving techniques   Continued Need for Acute Rehabilitation Level of Care: The patient requires daily medical management by a physician with specialized training in physical medicine and rehabilitation for the following reasons: Direction of a multidisciplinary physical rehabilitation program to maximize functional independence : Yes Medical management of patient stability for increased activity during participation in an intensive rehabilitation regime.: Yes Analysis of laboratory values and/or radiology reports with any subsequent need for medication adjustment and/or medical intervention. : Yes   I attest that I was present, lead the team conference, and concur with the assessment and plan of the team.   Cristi Loron 07/03/2020, 1:37 PM

## 2020-07-03 NOTE — Progress Notes (Signed)
Occupational Therapy Session Note  Patient Details  Name: Andres White MRN: [id number] Date of Birth: 1945/10/07  Today's Date: 07/03/2020  Session 1 OT Individual Time: 1000-1030 OT Individual Time Calculation (min): 30 min   Session 2 OT Individual Time: 1300-1345 OT Individual Time Calculation (min): 45 min    Short Term Goals: Week 1:  OT Short Term Goal 1 (Week 1): Pt will complete sit<>stand with max A of 1 in preparation for BADL tasks OT Short Term Goal 2 (Week 1): Pt will complete toilet transfer with max A OT Short Term Goal 3 (Week 1): Pt will perform LB dressing with mod A OT Short Term Goal 4 (Week 1): Pt will perform UB dressing with min A  Skilled Therapeutic Interventions/Progress Updates:  Session 1   Pt greeted semi-reclined in bed. Pt reported he had been incontinent bc he could not reach the urinal which was placed on footboard of the bed. Pt able to assist with washing peri-area and rolled with min A for brief change. Pt came to sitting EOB with min A and HOB flat. Worked on threading pants at EOB and forced use of R hand during LB dressing task. Sit<>stand w/ RW and max of 1. Pt needed OT assist to pull up pants. Mod A to then pivot to wc w/ RW. R UE ROM with adduction/abduction across table using towel. Isolated wrist flex/ext. Pt returned to room and left seated in wc with alarm belt on, call bell in reach, and needs met.  Session 2 Pt greeted seated in wc and agreeable to OT treatment session. Pt reported he did not need to go to the bathroom. Pt brought down to therapy gym in wc and OT applied kinesiotape to R hand for edema management. R hand fine motor control with graded peg board task with focus on facilittaing normal movement patterns of hand/wrist/shouldler. Sit><stand at high-low table with mod A. Then worked on balance, weight shifting, and R shoulder FF to reach and touch therapists hand outside base of support. Pt returned to room and left seated in wc  with chair alarm on, call bell in reach, and needs met.    Therapy Documentation Precautions:  Precautions Precautions: Fall Precaution Comments: watch HR Restrictions Weight Bearing Restrictions: No Pain:  denies pain today  Therapy/Group: Individual Therapy  Valma Cava 07/03/2020, 3:18 PM

## 2020-07-03 NOTE — Progress Notes (Signed)
[facility] PHYSICAL MEDICINE & REHABILITATION PROGRESS NOTE  Subjective/Complaints: Reports pain in left eye when asked about his squinting. Also says yes to blurry vision. Says this has been new since stroke. Discussed neuro-ophthalmology follow-up with him.  HR 45, asymptomatic  ROS: Denies CP, SOB, N/V/D  Objective: Vital Signs: Blood pressure 136/88, pulse (!) 45, temperature 97.6 F (36.4 C), resp. rate 16, height 5\' 7"  (1.702 m), weight 83.2 kg, SpO2 99 %. DG Chest 2 View  Result Date: 07/01/2020 CLINICAL DATA:  Leukocytosis EXAM: CHEST - 2 VIEW COMPARISON:  11/16/2017 FINDINGS: Cardiac shadow is within normal limits. External monitoring device is noted over the left chest. The lungs are clear bilaterally. No acute bony abnormality is seen. IMPRESSION: No active cardiopulmonary disease. Electronically Signed   By: Inez Catalina M.D.   On: 07/01/2020 21:58   DG Wrist Complete Right  Result Date: 07/01/2020 CLINICAL DATA:  Right wrist pain, no known injury, initial encounter EXAM: RIGHT WRIST - COMPLETE 3+ VIEW COMPARISON:  None. FINDINGS: Mild degenerative changes are noted at the radiocarpal joint. No acute fracture or dislocation is seen. Old healed fracture in the fifth metacarpal is noted. Degenerative changes in the first MCP joint and CMC joint are noted as well. Diffuse vascular calcifications are seen. No soft tissue abnormality is noted. IMPRESSION: Degenerative change of the wrist without acute abnormality. Electronically Signed   By: Inez Catalina M.D.   On: 07/01/2020 21:59   Recent Labs    07/02/20 0710  WBC 8.6  HGB 9.6*  HCT 29.4*  PLT 176   Recent Labs    07/01/20 0106 07/02/20 0710  NA 130* 132*  K 4.0 4.8  CL 101 104  CO2 20* 18*  GLUCOSE 122* 138*  BUN 34* 25*  CREATININE 1.54* 1.16  CALCIUM 8.7* 8.8*    Intake/Output Summary (Last 24 hours) at 07/03/2020 1147 Last data filed at 07/03/2020 0700 Gross per 24 hour  Intake 720 ml  Output 100 ml   Net 620 ml        Physical Exam: BP 136/88   Pulse (!) 45   Temp 97.6 F (36.4 C)   Resp 16   Ht 5\' 7"  (1.702 m)   Wt 83.2 kg   SpO2 99%   BMI 28.73 kg/m  Gen: no distress, normal appearing HEENT: oral mucosa pink and moist, NCAT Cardio: Reg rate Chest: normal effort, normal rate of breathing Abd: soft, non-distended Ext: no edema Musc: Right wrist swelling decreased, still with tenderness over CMC joint No tenderness in any other joints. Neuro: Alert Mild expressive aphasia and dysarthria, unchanged Right facial droop.  Motor: LUE/LLE: 5/5 proximal to distal RUE: 2+/5 proximal to distal with ataxia (? Pain inhibition), unchanged RLE: 2+/5 proximal to distal  Assessment/Plan: 1. Functional deficits which require 3+ hours per day of interdisciplinary therapy in a comprehensive inpatient rehab setting.  Physiatrist is providing close team supervision and 24 hour management of active medical problems listed below.  Physiatrist and rehab team continue to assess barriers to discharge/monitor patient progress toward functional and medical goals   Care Tool:  Bathing    Body parts bathed by patient: Abdomen, Chest, Front perineal area, Left upper leg, Right upper leg, Face, Right arm   Body parts bathed by helper: Left arm, Buttocks, Right lower leg, Left lower leg, Face     Bathing assist Assist Level: Maximal Assistance - Patient 24 - 49%     Upper Body Dressing/Undressing Upper body dressing  What is the patient wearing?: Pull over shirt    Upper body assist Assist Level: Total Assistance - Patient < 25%    Lower Body Dressing/Undressing Lower body dressing      What is the patient wearing?: Pants, Incontinence brief     Lower body assist Assist for lower body dressing: Total Assistance - Patient < 25%     Toileting Toileting    Toileting assist Assist for toileting: Maximal Assistance - Patient 25 - 49% (Per OT Eval for bed level hygiene and  donning brief)     Transfers Chair/bed transfer  Transfers assist  Chair/bed transfer activity did not occur: Safety/medical concerns  Chair/bed transfer assist level: Moderate Assistance - Patient 50 - 74%     Locomotion Ambulation   Ambulation assist   Ambulation activity did not occur: Safety/medical concerns  Assist level: Moderate Assistance - Patient 50 - 74% Assistive device: Walker-rolling Max distance: 50'   Walk 10 feet activity   Assist  Walk 10 feet activity did not occur: Safety/medical concerns  Assist level: Moderate Assistance - Patient - 50 - 74% Assistive device: Walker-rolling   Walk 50 feet activity   Assist Walk 50 feet with 2 turns activity did not occur: Safety/medical concerns  Assist level: Moderate Assistance - Patient - 50 - 74% Assistive device: Walker-rolling    Walk 150 feet activity   Assist Walk 150 feet activity did not occur: Safety/medical concerns         Walk 10 feet on uneven surface  activity   Assist Walk 10 feet on uneven surfaces activity did not occur: Safety/medical concerns         Wheelchair     Assist Will patient use wheelchair at discharge?: No             Wheelchair 50 feet with 2 turns activity    Assist            Wheelchair 150 feet activity     Assist      Assist Level: Independent    Medical Problem List and Plan: 1.  Impaired mobility and ADLs secondary to acute CVA with left midbrain infarct  Conitnue CIR  Repeat head CT ordered 11/27 given Weakness and autonomic changes. Shows mild edema associated with the known left midbrain infarcts. No evidence of acute hemorrhage or substantial mass effect. 2.  Antithrombotics: -DVT/anticoagulation:  Pharmaceutical: Lovenox             -antiplatelet therapy: Aspirin 3. Pain Management: Tylenol prn.   Gabapentin 100 3 times daily started 11/26 for?  Neuropathic pain post stroke, decreased to nightly on 11/28 due to?  Side effects, with improvement  Right wrist pain-x-ray shows OA, no acute changes, uric acid ordered   Empiric prednisone started for gout  11/29: improved 4. Mood: Continue Celexa 20mg  daily for depression 5. Neuropsych: This patient is not fully capable of making decisions on his own behalf. 6. Skin/Wound Care: ZioPatch in place, dry skin- added Eucerin cream 7. Fluids/Electrolytes/Nutrition: Monitor I/Os. Encourage fluid intake  8. HTN: Monitor BP   Decrease Amlodipine to 5mg , DC'd on 11/28.   Continue Lisinopril 40mg  daily, decreased to 10 on 11/28  Well controlled 11/30  Monitor with increased mobility 9. Tobacco abuse: Provide counseling  10. Tachycardia/beats of NSVT: Zio patch in place.   Down to 77 on 11/30, asymptomatic  Monitor with increased activity 11. AKI  Creatinine 1.54 on 11/28, down to 1.19 on 11/29  IVF initiated on 11/27,  DCed  Encourage fluids 12. ABLA  Hb 11.2 on 11/27, labs ordered for tomorrow  Cont to monitor 13.  Leukocytosis  WBCs 13.8 on 11/27, down to 8/6 on 11/29.   UA equivocal, UC pending  CXR normal  Afebrile  See #3 14.  Hyponatremia  Sodium 130 on 11/28, 132 on 11/29 15.  Post stroke dysphagia  D2 thins  Advance diet as tolerated  LOS: 6 days A FACE TO FACE EVALUATION WAS PERFORMED  Martha Clan P Aurther Harlin 07/03/2020, 11:47 AM

## 2020-07-03 NOTE — Progress Notes (Signed)
Physical Therapy Session Note  Patient Details  Name: Andres White MRN: [id number] Date of Birth: [birth year]  Today's Date: 07/03/2020 PT Individual Time: 1425-1525 PT Individual Time Calculation (min): 60 min   Short Term Goals: Week 1:  PT Short Term Goal 1 (Week 1): Pt will perform bed mobility with CGA. PT Short Term Goal 2 (Week 1): Pt wil perform sit to stand with modA. PT Short Term Goal 3 (Week 1): Pt will perform bed to chair transfer with minA. PT Short Term Goal 4 (Week 1): Pt will ambulate 100' with minA and LRAD.  Skilled Therapeutic Interventions/Progress Updates:     Pt received seated in [facility] and agrees to therapy. No complaint of pain. WC transport to gym for time management. Pt performs sit to stand transfer multiple times during session with use of RW. Initially pt requires modA but with subsequent reps improves to minA and increased time to complete. Pt ambulates bouts of 44', 80', and 90' during session. PT provides minA with facilitation of lateral weight shifting, safe RW management due to pt frequently shifting hands forward and walker, and providing multimodal cuing for upright posture and gaze, increased gait speed, and increased proximity to RW for safety. Pt requires extended seated rest breaks following each bout of ambulation.  Pt performs NMR in high kneeling position on mat table. ModA +2 to climb hand over hand onto mat. In high kneeling, PT provides tactile cues for hip and trunk extension. Pt able to maintain with CGA. Pt then reaches for 2kg medicine ball on L and manipulates to transfer ball laterally to the R. Cues for trunk rotation and utilization of gaze for improved accuracy.   Pt performs Nustep at workload of 5 for 8:00 with steps per minute >50. Performed for reciprocal coordination, strength, and endurance training. PT provides verbal and tactile cues for optimal placement of R hemibody.  Pt left seated in WC with alarm intact and all needs within  reach.  Therapy Documentation Precautions:  Precautions Precautions: Fall Precaution Comments: watch HR Restrictions Weight Bearing Restrictions: No    Therapy/Group: Individual Therapy  Breck Coons, PT, DPT 07/03/2020, 3:44 PM

## 2020-07-03 NOTE — Progress Notes (Signed)
Patient ID: Andres White, male   DOB: 1946/01/22, 74 y.o.   MRN: [id number]  SW met with pt to inform on d/c date. SW called [name] while in room to inform on d/c date 12/15 and requested return phone call to discuss further. SW waiting on follow-up.   Loralee Pacas, MSW, [place] Office: [phone] Cell: [phone] Fax: [phone]

## 2020-07-03 NOTE — Progress Notes (Signed)
Speech Language Pathology Daily Session Note  Patient Details  Name: Andres White MRN: [id number] Date of Birth: Jul 01, 1946  Today's Date: 07/03/2020 SLP Individual Time: 0735-0830 SLP Individual Time Calculation (min): 55 min  Short Term Goals: Week 1: SLP Short Term Goal 1 (Week 1): Pt will demonstrate ability to detect functional errors within tasks with Mod A verbal/visual cues. SLP Short Term Goal 2 (Week 1): Pt will recall new and/or daily information with Mod A for use of compensatory strategies or aids. SLP Short Term Goal 3 (Week 1): Pt will recall 2 safety precuations with Mod A cues for use of aids or strategies. SLP Short Term Goal 4 (Week 1): Pt will demonstrate ability to problem solving basic to mildly complex functional tasks with Mod A verbal/visual cues. SLP Short Term Goal 5 (Week 1): Patient will consume current diet with minimal overt s/s of aspiration with supervision verbal cues for use of swallowing compensatory strategies. SLP Short Term Goal 6 (Week 1): Patient will demonstrate efficient mastication and complete oral clearance without overt s/s of aspiration with trials of Dys. 3 textures over 2 sessions prior to upgrade.  Skilled Therapeutic Interventions: Skilled treatment session focused on dysphagia and cognitive goals. SLP facilitated session by providing skilled observation with breakfast meal of Dys. 2 textures with thin liquids. Patient consumed meal without overt s/s of aspiration but required Min verbal cues for use of swallowing compensatory strategies. Recommend patient continue current diet. SLP also facilitated session by providing total A for recall of his current medications and their functions. Patient also demonstrated some confusion when discussing medication management tasks at home but suspect patient organized his own medications at home with a pill box. Therefore, will attempt at next session. Patient left upright in bed with alarm on and all needs  within reach. Continue with current plan of care.      Pain No/Denies Pain   Therapy/Group: Individual Therapy  Versia Mignogna 07/03/2020, 10:22 AM

## 2020-07-04 ENCOUNTER — Inpatient Hospital Stay (HOSPITAL_COMMUNITY): Payer: Medicare Other | Admitting: Occupational Therapy

## 2020-07-04 ENCOUNTER — Inpatient Hospital Stay (HOSPITAL_COMMUNITY): Payer: Medicare Other | Admitting: Speech Pathology

## 2020-07-04 ENCOUNTER — Inpatient Hospital Stay (HOSPITAL_COMMUNITY): Payer: Medicare Other

## 2020-07-04 DIAGNOSIS — I639 Cerebral infarction, unspecified: Secondary | ICD-10-CM | POA: Diagnosis not present

## 2020-07-04 LAB — URINE CULTURE: Culture: 40000 — AB

## 2020-07-04 LAB — GLUCOSE, CAPILLARY: Glucose-Capillary: 75 mg/dL (ref 70–99)

## 2020-07-04 MED ORDER — NITROFURANTOIN MONOHYD MACRO 100 MG PO CAPS
100.0000 mg | ORAL_CAPSULE | Freq: Two times a day (BID) | ORAL | Status: DC
  Administered 2020-07-04 – 2020-07-13 (×18): 100 mg via ORAL
  Filled 2020-07-04 (×18): qty 1

## 2020-07-04 NOTE — Progress Notes (Signed)
Nutrition Follow-up  RD working remotely.  DOCUMENTATION CODES:   Not applicable  INTERVENTION:   - Continue Magic cup TID with meals, each supplement provides 290 kcal and 9 grams of protein  - Continue MVI with minerals daily  - Encourage adequate PO intake  NUTRITION DIAGNOSIS:   Inadequate oral intake related to (masticatory difficulty) as evidenced by per patient/family report.  Progressing  GOAL:   Patient will meet greater than or equal to 90% of their needs  Progressing  MONITOR:   PO intake, Supplement acceptance, Labs, Weight trends, Skin, I & O's  REASON FOR ASSESSMENT:   Malnutrition Screening Tool    ASSESSMENT:   Andres White is a 74 y.o. male with PMH of HTN, arthritis, tobacco use, who presented to [facility] on 06/24/20 with 4 day history of right leg weakness, L facial weakness, and speech changes. 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  Noted target d/c date of 12/15.  Spoke with pt briefly via phone call to room. Pt states that he has a good appetite and has been eating well. Pt did not answer when RD asked if he enjoyed Magic Cup supplements on meal trays. Pt seemed slightly confused. RD will continue with current supplement regimen and MVI.  Meal Completion: 25-100%  Medications reviewed and include: MVI with minerals, prednisone  Labs reviewed: sodium 132, BUN 25, hemoglobin 9.6  Diet Order:   Diet Order            DIET DYS 2 Room service appropriate? Yes with Assist; Fluid consistency: Thin  Diet effective now                 EDUCATION NEEDS:   Education needs have been addressed  Skin:  Skin Assessment: Reviewed RN Assessment  Last BM:  07/03/20 medium type 6  Height:   Ht Readings from Last 1 Encounters:  06/27/20 5\' 7"  (1.702 m)    Weight:   Wt  Readings from Last 1 Encounters:  06/27/20 83.2 kg    Ideal Body Weight:  67.3 kg  BMI:  Body mass index is 28.73 kg/m.  Estimated Nutritional Needs:   Kcal:  1800-2000  Protein:  100-115 grams  Fluid:  > 1.8 L    Gustavus Bryant, MS, RD, LDN Inpatient Clinical Dietitian Please see AMiON for contact information.

## 2020-07-04 NOTE — Progress Notes (Signed)
Speech Language Pathology Daily Session Note  Patient Details  Name: BRAXDEN LOVERING MRN: [id number] Date of Birth: 18-Feb-1946  Today's Date: 07/04/2020 SLP Individual Time: 0725-0820 SLP Individual Time Calculation (min): 55 min  Short Term Goals: Week 1: SLP Short Term Goal 1 (Week 1): Pt will demonstrate ability to detect functional errors within tasks with Mod A verbal/visual cues. SLP Short Term Goal 2 (Week 1): Pt will recall new and/or daily information with Mod A for use of compensatory strategies or aids. SLP Short Term Goal 3 (Week 1): Pt will recall 2 safety precuations with Mod A cues for use of aids or strategies. SLP Short Term Goal 4 (Week 1): Pt will demonstrate ability to problem solving basic to mildly complex functional tasks with Mod A verbal/visual cues. SLP Short Term Goal 5 (Week 1): Patient will consume current diet with minimal overt s/s of aspiration with supervision verbal cues for use of swallowing compensatory strategies. SLP Short Term Goal 6 (Week 1): Patient will demonstrate efficient mastication and complete oral clearance without overt s/s of aspiration with trials of Dys. 3 textures over 2 sessions prior to upgrade.  Skilled Therapeutic Interventions: Skilled treatment session focused on dysphagia and cognitive goals. SLP facilitated session by providing skilled observation with Dys. 2 textures with thin liquids. Patient consumed meal without overt s/s of aspiration and was overall Mod I for use of swallowing compensatory strategies. Therefore, recommend patient change to intermittent supervision with set-up for meals. SLP also facilitated session by providing overall Mod A verbal cues for complex problem solving during a complex medication management task. Patient required extra time to complete task, therefore, task will be completed tomorrow. Patient left upright in bed with alarm on and all needs within reach. Continue with current plan of care.       Pain Pain Assessment Pain Score: 0-No pain  Therapy/Group: Individual Therapy  Leilene Diprima 07/04/2020, 3:25 PM

## 2020-07-04 NOTE — Progress Notes (Signed)
Physical Therapy Weekly Progress Note  Patient Details  Name: Andres White MRN: [id number] Date of Birth: September 02, 1945  Beginning of progress report period: June 29, 2020 End of progress report period: July 04, 2020  Today's Date: 07/04/2020 PT Individual Time: 2426-8341 PT Individual Time Calculation (min): 72 min   Patient has met 3 of 4 short term goals.  Pt is progressing very well toward mobility goals. Pt has complained much less of pain in bilateral feet in recent sessions and has worked Biomedical engineer well with therapy. Pt performing sit to stand and bed to chair transfers consistently at minA level and occasionally with CGA. Pt is ambulating up to 100' with light minA. Pt demonstrates safety awareness deficits, and safety impairments with use of RW, and requires frequent verbal cues for safe use. Focus of coming week to be increased safety with RW, increased endurance, and independence with mobility.  Patient continues to demonstrate the following deficits muscle weakness, decreased cardiorespiratoy endurance, decreased coordination, decreased awareness, decreased problem solving, decreased safety awareness and decreased memory and decreased standing balance and hemiplegia and therefore will continue to benefit from skilled PT intervention to increase functional independence with mobility.  Patient progressing toward long term goals..  Continue plan of care.  PT Short Term Goals Week 1:  PT Short Term Goal 1 (Week 1): Pt will perform bed mobility with CGA. PT Short Term Goal 1 - Progress (Week 1): Progressing toward goal PT Short Term Goal 2 (Week 1): Pt wil perform sit to stand with modA. PT Short Term Goal 2 - Progress (Week 1): Met PT Short Term Goal 3 (Week 1): Pt will perform bed to chair transfer with minA. PT Short Term Goal 3 - Progress (Week 1): Met PT Short Term Goal 4 (Week 1): Pt will ambulate 100' with minA and LRAD. PT Short Term Goal 4 - Progress (Week 1): Met Week 2:   PT Short Term Goal 1 (Week 2): Pt will perform bed mobility with CGA PT Short Term Goal 2 (Week 2): Pt will perform bed to chair transfer consistently with CGA. PT Short Term Goal 3 (Week 2): Pt will ambulate 150' with CGA and LRAD.  Skilled Therapeutic Interventions/Progress Updates:  Ambulation/gait training;Cognitive remediation/compensation;Discharge planning;DME/adaptive equipment instruction;Functional mobility training;Pain management;Psychosocial support;Splinting/orthotics;Therapeutic Activities;UE/LE Strength taining/ROM;Balance/vestibular training;Community reintegration;Disease management/prevention;Functional electrical stimulation;Neuromuscular re-education;Patient/family education;Skin care/wound management;Stair training;Therapeutic Exercise;UE/LE Coordination activities;Wheelchair propulsion/positioning   Pt received seated in [facility] with daughter present. Pt agreeable to therapy and no complaint of pain. WC transport to gym for time management. Pt performs NMR for standing balance with cognitive overlay with use of BITS system. Pt initially performs targeted reaching in sequence with PT providing CGA primarily for reaching outside BOS, with verbal cues on body mechanics. Pt then performs path finding activity with similar assist and cuing form PT. Pt demonstrates improvement in efficiency and performance with subsequent trials of activity.   Pt ambulates multiple bouts with RW and minA/CGA. Initially pt ambulates 95'. Following seated rest break pt ambulates in and out of cones to increase dynamic challenge, 3x100' with seated rest breaks. Pt ambulates final bout of 110'. PT provides verbal cues for safety with RW, hand placement, maintaining upright gaze to improve posture and balance, and RW management especially during turns.  Pt completes x8:00 on Nustep at workload of 5 with SPM ~50, taking x1 extended rest break. Performed for strength and endurance training and reciprocal coordination  training.  Pt left seated in WC with alarm intact and all needs within  reach.  Therapy Documentation Precautions:  Precautions Precautions: Fall Precaution Comments: watch HR Restrictions Weight Bearing Restrictions: No   Therapy/Group: Individual Therapy  Breck Coons, PT, DPT 07/04/2020, 3:39 PM

## 2020-07-04 NOTE — Progress Notes (Signed)
Occupational Therapy Session Note  Patient Details  Name: Andres White MRN: [id number] Date of Birth: 04-Jul-1946  Today's Date: 07/04/2020 OT Individual Time: 8119-1478 OT Individual Time Calculation (min): 75 min    Short Term Goals: Week 1:  OT Short Term Goal 1 (Week 1): Pt will complete sit<>stand with max A of 1 in preparation for BADL tasks OT Short Term Goal 2 (Week 1): Pt will complete toilet transfer with max A OT Short Term Goal 3 (Week 1): Pt will perform LB dressing with mod A OT Short Term Goal 4 (Week 1): Pt will perform UB dressing with min A  Skilled Therapeutic Interventions/Progress Updates:    Pt received in bed and agreeable to therapy.  Pt denied needing to use the bathroom, sat to EOB with min A and then had a wet brief.  Focused on mobility to access to toilet.  Pt worked on sit to stand from bed with pushing forward from bed and then reaching to RW which worked well but does need frequent cuing with foot placement. Once pt rose to stand with min -mod A, used Rw with min -mod to pivot to w/c.  Pt taken to toilet and did very well using R hand on bar and L on wc arm rest to push up and pivot to toilet with min -mod A.  A with clothing management. Pt had to sit for a few minutes but was able to have a BM. He did part of the clean up using his R hand (covered with a glove), he was able to weight shift to L and reach with R but needed A from therapist for thorough cleansing. Sit to stand and stand pivot back to wc to move to sink to bathe.  Did well using R hand to wash L arm with assist , A to wash feet.  Pt did have some edema in B feet with R foot more so than L.  He does not have an order for TEDs, but applied them to reduce the swelling. Encouraged pt to actively move his feet.  Sit to stand several times at the sink for donning brief and pants with min A with support from sink.   Pt using R arm more actively, slightly improved grasp.  Pt resting in wc with belt alarm on  and all needs met.  Call light in reach.   Therapy Documentation Precautions:  Precautions Precautions: Fall Precaution Comments: watch HR Restrictions Weight Bearing Restrictions: No      Pain: Pain Assessment Pain Score: 0-No pain   Therapy/Group: Individual Therapy  Nardos Putnam 07/04/2020, 1:19 PM

## 2020-07-04 NOTE — Progress Notes (Addendum)
[place] PHYSICAL MEDICINE & REHABILITATION PROGRESS NOTE  Subjective/Complaints: No complaints this morning. Denies pain, constipation, insomnia.  Eye pain is improved Wrist swelling has improved  ROS: Denies CP, SOB, N/V/D  Objective: Vital Signs: Blood pressure 122/69, pulse (!) 54, temperature 97.6 F (36.4 C), resp. rate 16, height 5\' 7"  (1.702 m), weight 83.2 kg, SpO2 96 %. No results found. Recent Labs    07/02/20 0710  WBC 8.6  HGB 9.6*  HCT 29.4*  PLT 176   Recent Labs    07/02/20 0710  NA 132*  K 4.8  CL 104  CO2 18*  GLUCOSE 138*  BUN 25*  CREATININE 1.16  CALCIUM 8.8*    Intake/Output Summary (Last 24 hours) at 07/04/2020 1615 Last data filed at 07/04/2020 1433 Gross per 24 hour  Intake 462 ml  Output 525 ml  Net -63 ml        Physical Exam: BP 122/69 (BP Location: Left Arm)   Pulse (!) 54   Temp 97.6 F (36.4 C)   Resp 16   Ht 5\' 7"  (1.702 m)   Wt 83.2 kg   SpO2 96%   BMI 28.73 kg/m  Gen: no distress, normal appearing HEENT: oral mucosa pink and moist, NCAT Cardio: Reg rate Chest: normal effort, normal rate of breathing Abd: soft, non-distended Musc: Right wrist swelling decreased, still with tenderness over CMC joint No tenderness in any other joints. Neuro: Alert Mild expressive aphasia and dysarthria, unchanged Right facial droop.  Motor: LUE/LLE: 5/5 proximal to distal RUE: 2+/5 proximal to distal with ataxia (? Pain inhibition), unchanged RLE: 2+/5 proximal to distal  Assessment/Plan: 1. Functional deficits which require 3+ hours per day of interdisciplinary therapy in a comprehensive inpatient rehab setting.  Physiatrist is providing close team supervision and 24 hour management of active medical problems listed below.  Physiatrist and rehab team continue to assess barriers to discharge/monitor patient progress toward functional and medical goals   Care Tool:  Bathing    Body parts bathed by patient: Abdomen,  Chest, Front perineal area, Left upper leg, Right upper leg, Face, Right arm   Body parts bathed by helper: Right lower leg, Left lower leg, Buttocks, Left arm     Bathing assist Assist Level: Moderate Assistance - Patient 50 - 74%     Upper Body Dressing/Undressing Upper body dressing   What is the patient wearing?: Pull over shirt    Upper body assist Assist Level: Minimal Assistance - Patient > 75%    Lower Body Dressing/Undressing Lower body dressing      What is the patient wearing?: Pants, Incontinence brief     Lower body assist Assist for lower body dressing: Moderate Assistance - Patient 50 - 74%     Toileting Toileting    Toileting assist Assist for toileting: Moderate Assistance - Patient 50 - 74%     Transfers Chair/bed transfer  Transfers assist  Chair/bed transfer activity did not occur: Safety/medical concerns  Chair/bed transfer assist level: Moderate Assistance - Patient 50 - 74%     Locomotion Ambulation   Ambulation assist   Ambulation activity did not occur: Safety/medical concerns  Assist level: Minimal Assistance - Patient > 75% Assistive device: Walker-rolling Max distance: 110'   Walk 10 feet activity   Assist  Walk 10 feet activity did not occur: Safety/medical concerns  Assist level: Minimal Assistance - Patient > 75% Assistive device: Walker-rolling   Walk 50 feet activity   Assist Walk 50 feet with 2  turns activity did not occur: Safety/medical concerns  Assist level: Minimal Assistance - Patient > 75% Assistive device: Walker-rolling    Walk 150 feet activity   Assist Walk 150 feet activity did not occur: Safety/medical concerns         Walk 10 feet on uneven surface  activity   Assist Walk 10 feet on uneven surfaces activity did not occur: Safety/medical concerns         Wheelchair     Assist Will patient use wheelchair at discharge?: No             Wheelchair 50 feet with 2 turns  activity    Assist            Wheelchair 150 feet activity     Assist      Assist Level: Independent    Medical Problem List and Plan: 1.  Impaired mobility and ADLs secondary to acute CVA with left midbrain infarct  Continue CIR  Repeat head CT ordered 11/27 given Weakness and autonomic changes. Shows mild edema associated with the known left midbrain infarcts. No evidence of acute hemorrhage or substantial mass effect. 2.  Antithrombotics: -DVT/anticoagulation:  Pharmaceutical: Lovenox             -antiplatelet therapy: Aspirin 3. Pain Management: Tylenol prn.   Gabapentin 100 3 times daily started 11/26 for?  Neuropathic pain post stroke, decreased to nightly on 11/28 due to? Side effects, with improvement  Right wrist pain-x-ray shows OA, no acute changes, uric acid ordered and is normal   Empiric prednisone started for gout  12/1: improved.  4. Mood: Continue Celexa 20mg  daily for depression 5. Neuropsych: This patient is not fully capable of making decisions on his own behalf. 6. Skin/Wound Care: ZioPatch in place, dry skin- added Eucerin cream 7. Fluids/Electrolytes/Nutrition: Monitor I/Os. Encourage fluid intake  8. HTN: Monitor BP   Decrease Amlodipine to 5mg , DC'd on 11/28.   Continue Lisinopril 40mg  daily, decreased to 10 on 11/28  Well controlled 12/1  Monitor with increased mobility 9. Tobacco abuse: Provide counseling  10. Tachycardia/beats of NSVT: Zio patch in place.   Down to 44 on 12/1, asymptomatic  Monitor with increased activity 11. AKI  Creatinine 1.54 on 11/28, down to 1.19 on 11/29  IVF initiated on 11/27, DCed  Encourage fluids 12. ABLA  Hb 11.2 on 11/27, 9.6 on 11/29: asymptomatic. Repeat 12/2.  Cont to monitor 13.  Leukocytosis  WBCs 13.8 on 11/27, down to 8/6 on 11/29.   UA equivocal, UC returned positive for enterococcus faecalis sensitive to nitrofurantoin- ordered.   CXR normal  Afebrile  See #3 14.  Hyponatremia  Sodium 130  on 11/28, 132 on 11/29 15.  Post stroke dysphagia  D2 thins  Advance diet as tolerated  LOS: 7 days A FACE TO FACE EVALUATION WAS PERFORMED  Martha Clan P Tru Rana 07/04/2020, 4:15 PM

## 2020-07-05 ENCOUNTER — Inpatient Hospital Stay (HOSPITAL_COMMUNITY): Payer: Medicare Other | Admitting: Occupational Therapy

## 2020-07-05 ENCOUNTER — Inpatient Hospital Stay (HOSPITAL_COMMUNITY): Payer: Medicare Other | Admitting: Speech Pathology

## 2020-07-05 ENCOUNTER — Inpatient Hospital Stay (HOSPITAL_COMMUNITY): Payer: Medicare Other

## 2020-07-05 DIAGNOSIS — M792 Neuralgia and neuritis, unspecified: Secondary | ICD-10-CM | POA: Diagnosis not present

## 2020-07-05 DIAGNOSIS — I69391 Dysphagia following cerebral infarction: Secondary | ICD-10-CM | POA: Diagnosis not present

## 2020-07-05 DIAGNOSIS — I10 Essential (primary) hypertension: Secondary | ICD-10-CM | POA: Diagnosis not present

## 2020-07-05 DIAGNOSIS — M109 Gout, unspecified: Secondary | ICD-10-CM

## 2020-07-05 LAB — CBC
HCT: 27.4 % — ABNORMAL LOW (ref 39.0–52.0)
Hemoglobin: 9.2 g/dL — ABNORMAL LOW (ref 13.0–17.0)
MCH: 30.7 pg (ref 26.0–34.0)
MCHC: 33.6 g/dL (ref 30.0–36.0)
MCV: 91.3 fL (ref 80.0–100.0)
Platelets: 235 10*3/uL (ref 150–400)
RBC: 3 MIL/uL — ABNORMAL LOW (ref 4.22–5.81)
RDW: 12.4 % (ref 11.5–15.5)
WBC: 12.7 10*3/uL — ABNORMAL HIGH (ref 4.0–10.5)
nRBC: 0 % (ref 0.0–0.2)

## 2020-07-05 MED ORDER — ACETAMINOPHEN 325 MG PO TABS
325.0000 mg | ORAL_TABLET | ORAL | Status: DC | PRN
Start: 2020-07-05 — End: 2023-03-11

## 2020-07-05 MED ORDER — PREDNISONE 5 MG PO TABS
30.0000 mg | ORAL_TABLET | Freq: Every day | ORAL | Status: DC
  Administered 2020-07-06: 30 mg via ORAL
  Filled 2020-07-05: qty 1

## 2020-07-05 NOTE — Progress Notes (Signed)
Physical Therapy Session Note  Patient Details  Name: Andres White MRN: [id number] Date of Birth: 05-18-1946  Today's Date: 07/05/2020 PT Individual Time: 1000-1054 PT Individual Time Calculation (min): 54 min   Short Term Goals: Week 2:  PT Short Term Goal 1 (Week 2): Pt will perform bed mobility with CGA PT Short Term Goal 2 (Week 2): Pt will perform bed to chair transfer consistently with CGA. PT Short Term Goal 3 (Week 2): Pt will ambulate 150' with CGA and LRAD.  Skilled Therapeutic Interventions/Progress Updates:    Pt greeted supine in bed, agreeable to PT session, reports no pain. He requests that his PIV be removed from his L wrist prior to participating in therapy. RN called and notified and NT shortly arriving to assist with this. Completed supine<>sit with HOB flat with minA for trunk control. Able to sit EOB with close supervision and provided him with scrub pants which he required minA for threading LLE. Required elevated bed height to complete sit<>stand with min/modA to RW, Required minA guard in standing while he pulled pants over hips with minA as well. Stand<>pivot transfer with minA from EOB to w/c, cues for safety approach and RW management. Transported in w/c from his room to day room gym. Required minA for stand<>pivot transfer from w/c to Nustep. He completed a total of x8 min at workload of 4 with ~40-50 steps/minute. He required x2 brief seated rest breaks to complete the 8 minute trial. Focus of Nustep knee control, cardiovascular endurance, and BLE coordination. Pt reports moderate fatigue after completion. Stand<>pivot with minA and no AD from Nustep to w/c. Transported to main therapy gym in w/c and performed stand<>pivot with CGA and RW from w/c to mat table. Cues for using L Hand on armest with R hand on RW which appeared to improve transfer. Completed NMR in standing for dynamic standing balance with 2kg med ball. Performed upward reaching, thoracic rotations, and  forward reaching, 2x10 reps each, with minA guard. Cues for erect posture, upward gaze, and sequencing. He then ambulated 2x70ft with mostly CGA and RW (a few instances of minA for turning). Returned to his room in w/c and stand>step transfer with CGA and RW from w/c to EOB. Completed sit>supine with supervision with HOB Flat and use of bed rail. He remained semi-reclined at end of session with bed alarm on, needs in reach, made comfortable.    Therapy Documentation Precautions:  Precautions Precautions: Fall Precaution Comments: watch HR Restrictions Weight Bearing Restrictions: No  Therapy/Group: Individual Therapy  Chrishonda Hesch P Hallee Mckenny PT 07/05/2020, 10:24 AM

## 2020-07-05 NOTE — Progress Notes (Signed)
Occupational Therapy Weekly Progress Note  Patient Details  Name: Andres White MRN: [id number] Date of Birth: Apr 14, 1946  Beginning of progress report period: June 29, 2020 End of progress report period: July 15, 2020  Today's Date: 07/05/2020 OT Individual Time: 4174-0814 OT Individual Time Calculation (min): 73 min   Patient has met 4 of 4 short term goals.  Patient is making steady progress towards OT goals. Pt can transfer squat or stand-pivot with mod A of 1. He needs mod A for LB ADLs and continues to have posterior lean in standing requiring mod A for static standing balance within BADL tasks. Pt's pain has improved greatly and he is using R UE more consistently. Continue current POC.  Patient continues to demonstrate the following deficits: muscle weakness and muscle joint tightness, decreased cardiorespiratoy endurance, decreased attention to right and decreased motor planning, decreased awareness, decreased problem solving, decreased safety awareness and decreased memory and decreased sitting balance, decreased standing balance, decreased postural control and decreased balance strategies and therefore will continue to benefit from skilled OT intervention to enhance overall performance with BADL and Reduce care partner burden.  Patient progressing toward long term goals..  Continue plan of care.  OT Short Term Goals Week 1:  OT Short Term Goal 1 (Week 1): Pt will complete sit<>stand with max A of 1 in preparation for BADL tasks OT Short Term Goal 1 - Progress (Week 1): Met OT Short Term Goal 2 (Week 1): Pt will complete toilet transfer with max A OT Short Term Goal 2 - Progress (Week 1): Met OT Short Term Goal 3 (Week 1): Pt will perform LB dressing with mod A OT Short Term Goal 3 - Progress (Week 1): Met OT Short Term Goal 4 (Week 1): Pt will perform UB dressing with min A OT Short Term Goal 4 - Progress (Week 1): Met Week 2:  OT Short Term Goal 1 (Week 2): Pt will  complete toilet transfer with min A OT Short Term Goal 2 (Week 2): Pt will complete 1/3 toileting steps OT Short Term Goal 3 (Week 2): Pt will maintain standing balance with min A within BADL task  Skilled Therapeutic Interventions/Progress Updates:    Patient greeted semi-reclined in bed. Room smelled like urine and when assessed, pt had been incontinent of urine and soaked through pants and bedding. Pt's urinal was at the bottom of the bed and he could not reach it. Educated on importance of calling nursing for assistance and not just urinating in brief. Patient came to sitting EOB with supervision. Mod A stand-pivot to wc on R side. Bathing/dressing completed sit<>stand at the sink with overall mod A. When patient standing to wash peri-area, pt with incontinent void of urine all over the floor. Pt needed OT assist to wash lower legs and feet from urine. Pt brought to therapy gym in wc for time management. Pt completed 10 mins on [employer] arm bike with 2 rest breaks. OT removed kinesiotape with much noted improvement in R hand edema. Pt returned to room and completed stand-pivot back to bed with mod A. Pt left semi-reclined in bed with bed alarm on, call bell in reach, and needs met.   Therapy Documentation Precautions:  Precautions Precautions: Fall Precaution Comments: watch HR Restrictions Weight Bearing Restrictions: No Pain:   denies pain today  Therapy/Group: Individual Therapy  Valma Cava 07/05/2020, 2:08 PM

## 2020-07-05 NOTE — Progress Notes (Signed)
[place] PHYSICAL MEDICINE & REHABILITATION PROGRESS NOTE  Subjective/Complaints: Tells me slept well last night. No new complaints.   ROS: Patient denies fever, rash, sore throat, blurred vision, nausea, vomiting, diarrhea, cough, shortness of breath or chest pain, joint or back pain, headache, or mood change.    Objective: Vital Signs: Blood pressure 135/76, pulse (!) 47, temperature 97.6 F (36.4 C), temperature source Oral, resp. rate 18, height 5\' 7"  (1.702 m), weight 83.2 kg, SpO2 100 %. No results found. Recent Labs    07/05/20 0557  WBC 12.7*  HGB 9.2*  HCT 27.4*  PLT 235   No results for input(s): NA, K, CL, CO2, GLUCOSE, BUN, CREATININE, CALCIUM in the last 72 hours.  Intake/Output Summary (Last 24 hours) at 07/05/2020 1108 Last data filed at 07/05/2020 0357 Gross per 24 hour  Intake 399 ml  Output 350 ml  Net 49 ml        Physical Exam: BP 135/76 (BP Location: Right Arm)   Pulse (!) 47   Temp 97.6 F (36.4 C) (Oral)   Resp 18   Ht 5\' 7"  (1.702 m)   Wt 83.2 kg   SpO2 100%   BMI 28.73 kg/m  Constitutional: No distress . Vital signs reviewed. HEENT: EOMI, oral membranes moist Neck: supple Cardiovascular: RRR without murmur. No JVD    Respiratory/Chest: CTA Bilaterally without wheezes or rales. Normal effort    GI/Abdomen: BS +, non-tender, non-distended Ext: no clubbing, cyanosis, or edema Psych: pleasant and cooperative Musc: right wrist sl tender. Minimal swelling, K-tape Neuro: Alert Right C7, dysarthria  Motor: LUE/LLE: 5/5 proximal to distal RUE: 2+ to 3/5 proximal to distal with ataxia (? Pain inhibition), unchanged RLE: 2+ /5 proximal to distal  Assessment/Plan: 1. Functional deficits which require 3+ hours per day of interdisciplinary therapy in a comprehensive inpatient rehab setting.  Physiatrist is providing close team supervision and 24 hour management of active medical problems listed below.  Physiatrist and rehab team continue to  assess barriers to discharge/monitor patient progress toward functional and medical goals   Care Tool:  Bathing    Body parts bathed by patient: Abdomen, Chest, Front perineal area, Left upper leg, Right upper leg, Face, Right arm   Body parts bathed by helper: Right lower leg, Left lower leg, Buttocks, Left arm     Bathing assist Assist Level: Moderate Assistance - Patient 50 - 74%     Upper Body Dressing/Undressing Upper body dressing   What is the patient wearing?: Pull over shirt    Upper body assist Assist Level: Minimal Assistance - Patient > 75%    Lower Body Dressing/Undressing Lower body dressing      What is the patient wearing?: Pants, Incontinence brief     Lower body assist Assist for lower body dressing: Moderate Assistance - Patient 50 - 74%     Toileting Toileting    Toileting assist Assist for toileting: Moderate Assistance - Patient 50 - 74%     Transfers Chair/bed transfer  Transfers assist  Chair/bed transfer activity did not occur: Safety/medical concerns  Chair/bed transfer assist level: Moderate Assistance - Patient 50 - 74%     Locomotion Ambulation   Ambulation assist   Ambulation activity did not occur: Safety/medical concerns  Assist level: Minimal Assistance - Patient > 75% Assistive device: Walker-rolling Max distance: 110'   Walk 10 feet activity   Assist  Walk 10 feet activity did not occur: Safety/medical concerns  Assist level: Minimal Assistance - Patient >  75% Assistive device: Walker-rolling   Walk 50 feet activity   Assist Walk 50 feet with 2 turns activity did not occur: Safety/medical concerns  Assist level: Minimal Assistance - Patient > 75% Assistive device: Walker-rolling    Walk 150 feet activity   Assist Walk 150 feet activity did not occur: Safety/medical concerns         Walk 10 feet on uneven surface  activity   Assist Walk 10 feet on uneven surfaces activity did not occur:  Safety/medical concerns         Wheelchair     Assist Will patient use wheelchair at discharge?: No             Wheelchair 50 feet with 2 turns activity    Assist            Wheelchair 150 feet activity     Assist      Assist Level: Independent   BP 135/76 (BP Location: Right Arm)   Pulse (!) 47   Temp 97.6 F (36.4 C) (Oral)   Resp 18   Ht 5\' 7"  (1.702 m)   Wt 83.2 kg   SpO2 100%   BMI 28.73 kg/m   Medical Problem List and Plan: 1.  Impaired mobility and ADLs secondary to acute CVA with left midbrain infarct  Continue CIR  Repeat head CT ordered 11/27 given Weakness and autonomic changes. Shows mild edema associated with the known left midbrain infarcts. No evidence of acute hemorrhage or substantial mass effect.  -12/2 appears neurologically stable today 2.  Antithrombotics: -DVT/anticoagulation:  Pharmaceutical: Lovenox             -antiplatelet therapy: Aspirin 3. Pain Management: Tylenol prn.   Gabapentin 100 3 times daily started 11/26 for?  Neuropathic pain post stroke, decreased to nightly on 11/28 due to? Side effects, with improvement  Right wrist pain-x-ray shows OA, no acute changes, uric acid ordered and is normal   Empiric prednisone started for gout  12/1-2: improved--wean prednisone to 30mg  daily 4. Mood: Continue Celexa 20mg  daily for depression 5. Neuropsych: This patient is not fully capable of making decisions on his own behalf. 6. Skin/Wound Care: ZioPatch in place, dry skin- added Eucerin cream 7. Fluids/Electrolytes/Nutrition: Monitor I/Os. Encourage fluid intake  8. HTN: Monitor BP   Decrease Amlodipine to 5mg , DC'd on 11/28.   Continue Lisinopril 40mg  daily, decreased to 10 on 11/28  Well controlled 12/1-2    9. Tobacco abuse: Provide counseling  10. Tachycardia/beats of NSVT: Zio patch in place.   HR in 40-50's, asymptomatic---observe for now 11. AKI  Creatinine 1.54 on 11/28, down to 1.19 on 11/29  IVF initiated  on 11/27, DCed  Encourage fluids 12. ABLA  Hb 11.2 on 11/27, 9.6 on 11/29:     12/2 further drop to 9.2 today---check stool for OB 13.  Leukocytosis  WBCs 13.8 on 11/27, down to 8/6 on 11/29.   UA equivocal, UC returned positive for enterococcus faecalis sensitive to nitrofurantoin- ordered.   CXR normal  Afebrile  Wbc 12.7 12/2---likely steroid related 14.  Hyponatremia  Sodium 130 on 11/28, 132 on 11/29 15.  Post stroke dysphagia  D2 thins  Advance diet as tolerated  LOS: 8 days A FACE TO FACE EVALUATION WAS PERFORMED  Meredith Staggers 07/05/2020, 11:08 AM

## 2020-07-05 NOTE — Progress Notes (Signed)
Speech Language Pathology Weekly Progress and Session Note  Patient Details  Name: Andres White MRN: [id number] Date of Birth: 1946-05-09  Beginning of progress report period: June 27, 2020 End of progress report period: July 05, 2020  Today's Date: 07/05/2020 SLP Individual Time: 0700-0755 SLP Individual Time Calculation (min): 55 min  Short Term Goals: Week 1: SLP Short Term Goal 1 (Week 1): Pt will demonstrate ability to detect functional errors within tasks with Mod A verbal/visual cues. SLP Short Term Goal 1 - Progress (Week 1): Met SLP Short Term Goal 2 (Week 1): Pt will recall new and/or daily information with Mod A for use of compensatory strategies or aids. SLP Short Term Goal 2 - Progress (Week 1): Met SLP Short Term Goal 3 (Week 1): Pt will recall 2 safety precuations with Mod A cues for use of aids or strategies. SLP Short Term Goal 3 - Progress (Week 1): Met SLP Short Term Goal 4 (Week 1): Pt will demonstrate ability to problem solving basic to mildly complex functional tasks with Mod A verbal/visual cues. SLP Short Term Goal 4 - Progress (Week 1): Met SLP Short Term Goal 5 (Week 1): Patient will consume current diet with minimal overt s/s of aspiration with supervision verbal cues for use of swallowing compensatory strategies. SLP Short Term Goal 5 - Progress (Week 1): Met SLP Short Term Goal 6 (Week 1): Patient will demonstrate efficient mastication and complete oral clearance without overt s/s of aspiration with trials of Dys. 3 textures over 2 sessions prior to upgrade. SLP Short Term Goal 6 - Progress (Week 1): Not met    New Short Term Goals: Week 2: SLP Short Term Goal 1 (Week 2): Pt will self-monitor and correct errors during functional tasks wth Min verbal cues. SLP Short Term Goal 2 (Week 2): Pt will recall new and/or daily information with Min A for use of compensatory strategies or aids. SLP Short Term Goal 3 (Week 2): Pt will recall 2 safety precuations  with Min A cues for use of aids or strategies. SLP Short Term Goal 4 (Week 2): Pt will demonstrate ability to problem solving basic to mildly complex functional tasks with Min A verbal/visual cues. SLP Short Term Goal 5 (Week 2): Patient will consume current diet with minimal overt s/s of aspiration with Mod I for use of swallowing compensatory strategies. SLP Short Term Goal 6 (Week 2): Patient will demonstrate efficient mastication and complete oral clearance without overt s/s of aspiration with trials of Dys. 3 textures over 2 sessions prior to upgrade.  Weekly Progress Updates: Patient has made functional gains and has met 5 of 6 STGs this reporting period. Currently, patient is consuming dys. 2 textures with thin liquids with minimal overt s/s of aspiration and overall supervision verbal cues for use of swallowing compensatory strategies. Patient also requires overall Mod A verbal cues to complete functional and mildly complex tasks safely in regards to problem solving, recall, and awareness. Patient and family education is ongoing. Patient would benefit from continued skilled SLP intervention to maximize his cognitive and swallowing function prior to discharge.     Intensity: Minumum of 1-2 x/day, 30 to 90 minutes Frequency: 3 to 5 out of 7 days Duration/Length of Stay: 07/18/20 Treatment/Interventions: Dysphagia/aspiration precaution training;Cueing hierarchy;Therapeutic Activities;Patient/family education;Functional tasks;Environmental controls;Internal/external aids;Cognitive remediation/compensation   Daily Session  Skilled Therapeutic Interventions: Skilled treatment session focused on dysphagia and cognitive goals. SLP facilitated session by providing skilled observation with breakfast meal of Dys. 2 textures with  thin liquids. Patient consumed meal without overt s/s of aspiration and was overall Mod I for use of swallowing compensatory strategies. Therefore, recommend patient continue  current diet. SLP also facilitated session by finishing a complex medication management task. Patient required Mod A verbal cues for problem solving with task and Max A verbal cues for error awareness. Patient left upright in bed with alarm on and all needs within reach. Continue with current plan of care.      Pain No/Denies Pain   Therapy/Group: Individual Therapy  Alontae Chaloux 07/05/2020, 6:17 AM

## 2020-07-06 ENCOUNTER — Inpatient Hospital Stay (HOSPITAL_COMMUNITY): Payer: Medicare Other | Admitting: Occupational Therapy

## 2020-07-06 ENCOUNTER — Inpatient Hospital Stay (HOSPITAL_COMMUNITY): Payer: Medicare Other

## 2020-07-06 DIAGNOSIS — M792 Neuralgia and neuritis, unspecified: Secondary | ICD-10-CM | POA: Diagnosis not present

## 2020-07-06 DIAGNOSIS — I10 Essential (primary) hypertension: Secondary | ICD-10-CM | POA: Diagnosis not present

## 2020-07-06 DIAGNOSIS — I69391 Dysphagia following cerebral infarction: Secondary | ICD-10-CM | POA: Diagnosis not present

## 2020-07-06 DIAGNOSIS — M109 Gout, unspecified: Secondary | ICD-10-CM | POA: Diagnosis not present

## 2020-07-06 MED ORDER — PREDNISONE 20 MG PO TABS
20.0000 mg | ORAL_TABLET | Freq: Every day | ORAL | Status: DC
  Administered 2020-07-07: 20 mg via ORAL
  Filled 2020-07-06: qty 1

## 2020-07-06 NOTE — Progress Notes (Signed)
Occupational Therapy Session Note  Patient Details  Name: Andres White MRN: [id number] Date of Birth: [birth year]  Today's Date: 07/06/2020  Session 1 OT Individual Time: 1157-2620 OT Individual Time Calculation (min): 57 min   Session 2 OT Individual Time: 3559-7416 OT Individual Time Calculation (min): 44 min    Short Term Goals: Week 2:  OT Short Term Goal 1 (Week 2): Pt will complete toilet transfer with min A OT Short Term Goal 2 (Week 2): Pt will complete 1/3 toileting steps OT Short Term Goal 3 (Week 2): Pt will maintain standing balance with min A within BADL task  Skilled Therapeutic Interventions/Progress Updates:  Session 1   Patient greeted semi-reclined in bed and agreeable to OT treatment session. Pt's brief was dry as was bedding, so no sign of incontinence this am! OT encouraged pt to go to the bathroom, but he stated he went no too long ago and did not feel the urge to void. Patient came to sitting EOB with HOB elevated and supervision. Squat-pivot transfer to wc with mod A. Pt brought to the sink for BADL tasks. UB bathing completed with set-up A and R NMR with winging out wash cloths and opening containers. Sit<>stands at the sink with mod A and min/mod A for dynamic balance when washing peri-area. When standing to wash peri-area, patient incontinent of urine. Patient stated " I have to pee when I stand up." Discussed importance of trying to void now before BADLs. Worked on LB bathing/dressing with pt needing OT assist to wash lower legs and feet. Patient able to thread pants today! Worked on dynamc balance and alternating UE support on the sink to pull up pants. Worked on fine motor control with buttoning up shirt with medium sized buttons. Pt left seated in wc with chair alarm on, call bell in reach, and needs met  Session 2 Pt greeted seated in wc after lunch and agreeable to OT treatment session. Worked on R hand fine motor control with box and block test. R: 20, L:29.  OT issued pt with soft tan theraputty and worked on coordination, grip, and pinch strength. OT then placed 10 small beads in putty and had pt using functional pinch to pick out beads. SiT<>stand at high-low table with mod A. Worked on standing balance/endurance, problem solving, and R hand coordination with graded peg board task. Used alternating 2 colors and horizontal pattern with min verbal cues. Pt returned to room and pivoted back to bed with mod A. Pt left semi-reclined in bed with bed alarm on, call bell in reach, and needs met.   Therapy Documentation Precautions:  Precautions Precautions: Fall Precaution Comments: watch HR Restrictions Weight Bearing Restrictions: No Pain:  denies pain  Therapy/Group: Individual Therapy  Valma Cava 07/06/2020, 1:59 PM

## 2020-07-06 NOTE — Progress Notes (Signed)
Physical Therapy Session Note  Patient Details  Name: Andres White MRN: [id number] Date of Birth: 1946/06/17  Today's Date: 07/06/2020 PT Individual Time: 1130-1200 PT Individual Time Calculation (min): 30 min   Short Term Goals: Week 2:  PT Short Term Goal 1 (Week 2): Pt will perform bed mobility with CGA PT Short Term Goal 2 (Week 2): Pt will perform bed to chair transfer consistently with CGA. PT Short Term Goal 3 (Week 2): Pt will ambulate 150' with CGA and LRAD.  Skilled Therapeutic Interventions/Progress Updates:    Pt greeted seated in w/c, agreeable to PT session, without reports of pain. Family at bedside. Spoke with family briefly regarding pt's progress with therapy and continued barriers to functional mobility. W/c transport from time management to main therapy gym. Performed stand<>pivot transfer with minA and RW from w/c to mat table, required cues for safety approach and slowed activity. Performed repeated sit<>stands with minA and RW from lowered mat table height. He then ambulated 134ft with mostly CGA (a few instances of minA required for turning) and RW while on level surfaces. Stand<>pivot with minA and RW back to his w/c from mat table and he show's improved safety awareness with transfer this time. Returned to his room in his w/c and he remained seated with safety belt alarm on and needs in reach.  Therapy Documentation Precautions:  Precautions Precautions: Fall Precaution Comments: watch HR Restrictions Weight Bearing Restrictions: No  Therapy/Group: Individual Therapy  Andres White P Andres White PT 07/06/2020, 12:36 PM

## 2020-07-06 NOTE — Progress Notes (Signed)
[facility] PHYSICAL MEDICINE & REHABILITATION PROGRESS NOTE  Subjective/Complaints: Had a good night. Right hand feels better. No complaints  ROS: Patient denies fever, rash, sore throat, blurred vision, nausea, vomiting, diarrhea, cough, shortness of breath or chest pain, joint or back pain, headache, or mood change.     Objective: Vital Signs: Blood pressure 134/76, pulse (!) 45, temperature 98.9 F (37.2 C), temperature source Oral, resp. rate 19, height 5\' 7"  (1.702 m), weight 83.2 kg, SpO2 100 %. No results found. Recent Labs    07/05/20 0557  WBC 12.7*  HGB 9.2*  HCT 27.4*  PLT 235   No results for input(s): NA, K, CL, CO2, GLUCOSE, BUN, CREATININE, CALCIUM in the last 72 hours.  Intake/Output Summary (Last 24 hours) at 07/06/2020 1144 Last data filed at 07/06/2020 0541 Gross per 24 hour  Intake --  Output 700 ml  Net -700 ml        Physical Exam: BP 134/76 (BP Location: Right Arm)   Pulse (!) 45   Temp 98.9 F (37.2 C) (Oral)   Resp 19   Ht 5\' 7"  (1.702 m)   Wt 83.2 kg   SpO2 100%   BMI 28.73 kg/m  Constitutional: No distress . Vital signs reviewed. HEENT: EOMI, oral membranes moist Neck: supple Cardiovascular: RRR without murmur. No JVD    Respiratory/Chest: CTA Bilaterally without wheezes or rales. Normal effort    GI/Abdomen: BS +, non-tender, non-distended Ext: no clubbing, cyanosis, or edema Psych: pleasant and cooperative Musc: right wrist non-tender. Swelling gone Neuro: Alert Right C7, dysarthria  Motor: LUE/LLE: 5/5 proximal to distal RUE:   3 to 3+/5 proximal to distal with ataxia    RLE: 2+ /5 proximal to distal  Assessment/Plan: 1. Functional deficits which require 3+ hours per day of interdisciplinary therapy in a comprehensive inpatient rehab setting.  Physiatrist is providing close team supervision and 24 hour management of active medical problems listed below.  Physiatrist and rehab team continue to assess barriers to  discharge/monitor patient progress toward functional and medical goals   Care Tool:  Bathing    Body parts bathed by patient: Abdomen, Chest, Front perineal area, Left upper leg, Right upper leg, Face, Right arm   Body parts bathed by helper: Right lower leg, Left lower leg, Buttocks, Left arm     Bathing assist Assist Level: Moderate Assistance - Patient 50 - 74%     Upper Body Dressing/Undressing Upper body dressing   What is the patient wearing?: Pull over shirt    Upper body assist Assist Level: Minimal Assistance - Patient > 75%    Lower Body Dressing/Undressing Lower body dressing      What is the patient wearing?: Pants, Incontinence brief     Lower body assist Assist for lower body dressing: Moderate Assistance - Patient 50 - 74%     Toileting Toileting    Toileting assist Assist for toileting: Moderate Assistance - Patient 50 - 74%     Transfers Chair/bed transfer  Transfers assist  Chair/bed transfer activity did not occur: Safety/medical concerns  Chair/bed transfer assist level: Moderate Assistance - Patient 50 - 74%     Locomotion Ambulation   Ambulation assist   Ambulation activity did not occur: Safety/medical concerns  Assist level: Minimal Assistance - Patient > 75% Assistive device: Walker-rolling Max distance: 110'   Walk 10 feet activity   Assist  Walk 10 feet activity did not occur: Safety/medical concerns  Assist level: Minimal Assistance - Patient > 75%  Assistive device: Walker-rolling   Walk 50 feet activity   Assist Walk 50 feet with 2 turns activity did not occur: Safety/medical concerns  Assist level: Minimal Assistance - Patient > 75% Assistive device: Walker-rolling    Walk 150 feet activity   Assist Walk 150 feet activity did not occur: Safety/medical concerns         Walk 10 feet on uneven surface  activity   Assist Walk 10 feet on uneven surfaces activity did not occur: Safety/medical  concerns         Wheelchair     Assist Will patient use wheelchair at discharge?: No             Wheelchair 50 feet with 2 turns activity    Assist            Wheelchair 150 feet activity     Assist      Assist Level: Independent   BP 134/76 (BP Location: Right Arm)   Pulse (!) 45   Temp 98.9 F (37.2 C) (Oral)   Resp 19   Ht 5\' 7"  (1.702 m)   Wt 83.2 kg   SpO2 100%   BMI 28.73 kg/m   Medical Problem List and Plan: 1.  Impaired mobility and ADLs secondary to acute CVA with left midbrain infarct  Continue CIR PT, OT, SLP  -12/3 at neurological baseline 2.  Antithrombotics: -DVT/anticoagulation:  Pharmaceutical: Lovenox             -antiplatelet therapy: Aspirin 3. Pain Management: Tylenol prn.   Gabapentin 100 3 times daily started 11/26 for?  Neuropathic pain post stroke, decreased to nightly on 11/28 due to? Side effects, with improvement  Right wrist pain-x-ray shows OA, no acute changes, uric acid ordered and is normal   Empiric prednisone started for gout  12/3: improved--wean prednisone to 20mg  daily starting 12/4 4. Mood: Continue Celexa 20mg  daily for depression 5. Neuropsych: This patient is not fully capable of making decisions on his own behalf. 6. Skin/Wound Care: ZioPatch in place, dry skin- added Eucerin cream 7. Fluids/Electrolytes/Nutrition: Monitor I/Os. Encourage fluid intake  8. HTN: Monitor BP   Decrease Amlodipine to 5mg , DC'd on 11/28.   Continue Lisinopril 40mg  daily, decreased to 10 on 11/28  Well controlled 12/3    9. Tobacco abuse: Provide counseling  10. Tachycardia/beats of NSVT: Zio patch in place.   HR in 40-50's, asymptomatic---observe for now 11. AKI  Creatinine 1.54 on 11/28, down to 1.19 on 11/29  IVF initiated on 11/27, DCed  Encourage fluids  -recheck labs next week 12. ABLA  Hb 11.2 on 11/27, 9.6 on 11/29:     12/2 further drop to 9.2 today---check stool for OB 13.  Leukocytosis  WBCs 13.8 on  11/27, down to 8/6 on 11/29.   UA equivocal, UC returned positive for enterococcus faecalis sensitive to nitrofurantoin- ordered.   CXR normal  Afebrile  Wbc 12.7 12/2---likely steroid related 14.  Hyponatremia  Sodium 130 on 11/28, 132 on 11/29 15.  Post stroke dysphagia  D2 thins  Advance diet as tolerated per SLP  LOS: 9 days A FACE TO FACE EVALUATION WAS PERFORMED  Andres White 07/06/2020, 11:44 AM

## 2020-07-07 DIAGNOSIS — I10 Essential (primary) hypertension: Secondary | ICD-10-CM | POA: Diagnosis not present

## 2020-07-07 DIAGNOSIS — M109 Gout, unspecified: Secondary | ICD-10-CM | POA: Diagnosis not present

## 2020-07-07 DIAGNOSIS — I69391 Dysphagia following cerebral infarction: Secondary | ICD-10-CM | POA: Diagnosis not present

## 2020-07-07 DIAGNOSIS — M792 Neuralgia and neuritis, unspecified: Secondary | ICD-10-CM | POA: Diagnosis not present

## 2020-07-07 MED ORDER — PREDNISONE 5 MG PO TABS
10.0000 mg | ORAL_TABLET | Freq: Every day | ORAL | Status: DC
  Administered 2020-07-08: 10 mg via ORAL
  Filled 2020-07-07: qty 2

## 2020-07-07 NOTE — Progress Notes (Signed)
[facility] PHYSICAL MEDICINE & REHABILITATION PROGRESS NOTE  Subjective/Complaints: Up in bed about to eat breakfast. No complaints. Feels that therapy is helping  ROS: Patient denies fever, rash, sore throat, blurred vision, nausea, vomiting, diarrhea, cough, shortness of breath or chest pain,  headache, or mood change.      Objective: Vital Signs: Blood pressure 132/72, pulse (!) 48, temperature 97.9 F (36.6 C), temperature source Oral, resp. rate 18, height 5\' 7"  (1.702 m), weight 83.2 kg, SpO2 100 %. No results found. Recent Labs    07/05/20 0557  WBC 12.7*  HGB 9.2*  HCT 27.4*  PLT 235   No results for input(s): NA, K, CL, CO2, GLUCOSE, BUN, CREATININE, CALCIUM in the last 72 hours.  Intake/Output Summary (Last 24 hours) at 07/07/2020 1113 Last data filed at 07/07/2020 0751 Gross per 24 hour  Intake 960 ml  Output 625 ml  Net 335 ml        Physical Exam: BP 132/72 (BP Location: Right Arm)   Pulse (!) 48   Temp 97.9 F (36.6 C) (Oral)   Resp 18   Ht 5\' 7"  (1.702 m)   Wt 83.2 kg   SpO2 100%   BMI 28.73 kg/m  Constitutional: No distress . Vital signs reviewed. HEENT: EOMI, oral membranes moist Neck: supple Cardiovascular: RRR without murmur. No JVD    Respiratory/Chest: CTA Bilaterally without wheezes or rales. Normal effort    GI/Abdomen: BS +, non-tender, non-distended Ext: no clubbing, cyanosis, or edema Psych: pleasant and cooperative Musc: right wrist non-tender. Swelling resolved Neuro: Alert, reasonable insight Right C7, dysarthria persistent Motor: LUE/LLE: 5/5 proximal to distal RUE:   3 to 3+/5 proximal to distal with ataxia    RLE: 2+ /5 proximal to distal  Assessment/Plan: 1. Functional deficits which require 3+ hours per day of interdisciplinary therapy in a comprehensive inpatient rehab setting.  Physiatrist is providing close team supervision and 24 hour management of active medical problems listed below.  Physiatrist and rehab team  continue to assess barriers to discharge/monitor patient progress toward functional and medical goals   Care Tool:  Bathing    Body parts bathed by patient: Abdomen, Chest, Front perineal area, Left upper leg, Right upper leg, Face, Right arm   Body parts bathed by helper: Right lower leg, Left lower leg, Buttocks, Left arm     Bathing assist Assist Level: Moderate Assistance - Patient 50 - 74%     Upper Body Dressing/Undressing Upper body dressing   What is the patient wearing?: Pull over shirt    Upper body assist Assist Level: Minimal Assistance - Patient > 75%    Lower Body Dressing/Undressing Lower body dressing      What is the patient wearing?: Pants, Incontinence brief     Lower body assist Assist for lower body dressing: Moderate Assistance - Patient 50 - 74%     Toileting Toileting    Toileting assist Assist for toileting: Moderate Assistance - Patient 50 - 74%     Transfers Chair/bed transfer  Transfers assist  Chair/bed transfer activity did not occur: Safety/medical concerns  Chair/bed transfer assist level: Moderate Assistance - Patient 50 - 74%     Locomotion Ambulation   Ambulation assist   Ambulation activity did not occur: Safety/medical concerns  Assist level: Minimal Assistance - Patient > 75% Assistive device: Walker-rolling Max distance: 110'   Walk 10 feet activity   Assist  Walk 10 feet activity did not occur: Safety/medical concerns  Assist level: Minimal  Assistance - Patient > 75% Assistive device: Walker-rolling   Walk 50 feet activity   Assist Walk 50 feet with 2 turns activity did not occur: Safety/medical concerns  Assist level: Minimal Assistance - Patient > 75% Assistive device: Walker-rolling    Walk 150 feet activity   Assist Walk 150 feet activity did not occur: Safety/medical concerns         Walk 10 feet on uneven surface  activity   Assist Walk 10 feet on uneven surfaces activity did not  occur: Safety/medical concerns         Wheelchair     Assist Will patient use wheelchair at discharge?: No             Wheelchair 50 feet with 2 turns activity    Assist            Wheelchair 150 feet activity     Assist      Assist Level: Independent   BP 132/72 (BP Location: Right Arm)   Pulse (!) 48   Temp 97.9 F (36.6 C) (Oral)   Resp 18   Ht 5\' 7"  (1.702 m)   Wt 83.2 kg   SpO2 100%   BMI 28.73 kg/m   Medical Problem List and Plan: 1.  Impaired mobility and ADLs secondary to acute CVA with left midbrain infarct  Continue CIR PT, OT, SLP  -12/3 at neurological baseline 2.  Antithrombotics: -DVT/anticoagulation:  Pharmaceutical: Lovenox             -antiplatelet therapy: Aspirin 3. Pain Management: Tylenol prn.   Gabapentin 100 3 times daily started 11/26 for?  Neuropathic pain post stroke, decreased to nightly on 11/28 due to? Side effects, with improvement  Right wrist pain-x-ray shows OA, no acute changes, uric acid ordered and is normal   Empiric prednisone started for gout  12/4: improved--wean prednisone to 20mg  daily starting today---reduce to 10mg  tomorrow 4. Mood: Continue Celexa 20mg  daily for depression 5. Neuropsych: This patient is not fully capable of making decisions on his own behalf. 6. Skin/Wound Care: ZioPatch in place, dry skin- added Eucerin cream 7. Fluids/Electrolytes/Nutrition: Monitor I/Os. Encourage fluid intake  8. HTN: Monitor BP   Decrease Amlodipine to 5mg , DC'd on 11/28.   Continue Lisinopril 40mg  daily, decreased to 10 on 11/28  Well controlled 12/4    9. Tobacco abuse: Provide counseling  10. Tachycardia/beats of NSVT: Zio patch in place.   HR in 40-50's, asymptomatic---observe for now 11. AKI  Creatinine 1.54 on 11/28, down to 1.19 on 11/29  IVF initiated on 11/27, DCed  Encourage fluids  -recheck labs Monday 12. ABLA  Hb 11.2 on 11/27, 9.6 on 11/29:     12/2 further drop to 9.2. need sample of  stool for OB 13.  Leukocytosis  WBCs 13.8 on 11/27, down to 8/6 on 11/29.   UA equivocal, UC returned positive for enterococcus faecalis sensitive to nitrofurantoin- ordered.   CXR normal  Afebrile  Wbc 12.7 12/2---likely steroid related 14.  Hyponatremia  Sodium 130 on 11/28, 132 on 11/29 15.  Post stroke dysphagia  D2 thins  Advance diet as tolerated per SLP  LOS: 10 days A FACE TO FACE EVALUATION WAS PERFORMED  Meredith Staggers 07/07/2020, 11:13 AM

## 2020-07-08 DIAGNOSIS — I10 Essential (primary) hypertension: Secondary | ICD-10-CM | POA: Diagnosis not present

## 2020-07-08 DIAGNOSIS — I69391 Dysphagia following cerebral infarction: Secondary | ICD-10-CM | POA: Diagnosis not present

## 2020-07-08 DIAGNOSIS — M109 Gout, unspecified: Secondary | ICD-10-CM | POA: Diagnosis not present

## 2020-07-08 DIAGNOSIS — M792 Neuralgia and neuritis, unspecified: Secondary | ICD-10-CM | POA: Diagnosis not present

## 2020-07-08 LAB — OCCULT BLOOD X 1 CARD TO LAB, STOOL: Fecal Occult Bld: POSITIVE — AB

## 2020-07-08 MED ORDER — PANTOPRAZOLE SODIUM 40 MG PO TBEC
40.0000 mg | DELAYED_RELEASE_TABLET | Freq: Every day | ORAL | Status: DC
  Administered 2020-07-08 – 2020-07-12 (×5): 40 mg via ORAL
  Filled 2020-07-08: qty 1
  Filled 2020-07-08: qty 2
  Filled 2020-07-08 (×3): qty 1

## 2020-07-08 MED ORDER — PREDNISONE 5 MG PO TABS
5.0000 mg | ORAL_TABLET | Freq: Every day | ORAL | Status: DC
  Administered 2020-07-09: 5 mg via ORAL
  Filled 2020-07-08 (×4): qty 1

## 2020-07-08 NOTE — Progress Notes (Signed)
[facility] PHYSICAL MEDICINE & REHABILITATION PROGRESS NOTE  Subjective/Complaints: Just finished breakfast. In good spirits. No pain.   ROS: Patient denies fever, rash, sore throat, blurred vision, nausea, vomiting, diarrhea, cough, shortness of breath or chest pain, joint or back pain, headache, or mood change.     Objective: Vital Signs: Blood pressure (!) 148/96, pulse (!) 52, temperature 98.2 F (36.8 C), resp. rate 18, height 5\' 7"  (1.702 m), weight 83.2 kg, SpO2 100 %. No results found. No results for input(s): WBC, HGB, HCT, PLT in the last 72 hours. No results for input(s): NA, K, CL, CO2, GLUCOSE, BUN, CREATININE, CALCIUM in the last 72 hours.  Intake/Output Summary (Last 24 hours) at 07/08/2020 1059 Last data filed at 07/08/2020 0800 Gross per 24 hour  Intake 960 ml  Output 1725 ml  Net -765 ml        Physical Exam: BP (!) 148/96 (BP Location: Right Arm)   Pulse (!) 52   Temp 98.2 F (36.8 C)   Resp 18   Ht 5\' 7"  (1.702 m)   Wt 83.2 kg   SpO2 100%   BMI 28.73 kg/m  Constitutional: No distress . Vital signs reviewed. HEENT: EOMI, oral membranes moist Neck: supple Cardiovascular: RRR without murmur. No JVD    Respiratory/Chest: CTA Bilaterally without wheezes or rales. Normal effort    GI/Abdomen: BS +, non-tender, non-distended Ext: no clubbing, cyanosis, or edema Psych: pleasant and cooperative Musc: right wrist non-tender. Swelling resolved Neuro: Alert, reasonable insight Right C7, dysarthric Motor: LUE/LLE: 5/5 proximal to distal RUE:   3+/5 proximal to distal with ataxia    RLE: 2+ to 3- /5 proximal to distal  Assessment/Plan: 1. Functional deficits which require 3+ hours per day of interdisciplinary therapy in a comprehensive inpatient rehab setting.  Physiatrist is providing close team supervision and 24 hour management of active medical problems listed below.  Physiatrist and rehab team continue to assess barriers to discharge/monitor patient  progress toward functional and medical goals   Care Tool:  Bathing    Body parts bathed by patient: Abdomen, Chest, Front perineal area, Left upper leg, Right upper leg, Face, Right arm   Body parts bathed by helper: Right lower leg, Left lower leg, Buttocks, Left arm     Bathing assist Assist Level: Moderate Assistance - Patient 50 - 74%     Upper Body Dressing/Undressing Upper body dressing   What is the patient wearing?: Pull over shirt    Upper body assist Assist Level: Minimal Assistance - Patient > 75%    Lower Body Dressing/Undressing Lower body dressing      What is the patient wearing?: Pants, Incontinence brief     Lower body assist Assist for lower body dressing: Moderate Assistance - Patient 50 - 74%     Toileting Toileting    Toileting assist Assist for toileting: Moderate Assistance - Patient 50 - 74%     Transfers Chair/bed transfer  Transfers assist  Chair/bed transfer activity did not occur: Safety/medical concerns  Chair/bed transfer assist level: Moderate Assistance - Patient 50 - 74%     Locomotion Ambulation   Ambulation assist   Ambulation activity did not occur: Safety/medical concerns  Assist level: Minimal Assistance - Patient > 75% Assistive device: Walker-rolling Max distance: 110'   Walk 10 feet activity   Assist  Walk 10 feet activity did not occur: Safety/medical concerns  Assist level: Minimal Assistance - Patient > 75% Assistive device: Walker-rolling   Walk 50 feet activity  Assist Walk 50 feet with 2 turns activity did not occur: Safety/medical concerns  Assist level: Minimal Assistance - Patient > 75% Assistive device: Walker-rolling    Walk 150 feet activity   Assist Walk 150 feet activity did not occur: Safety/medical concerns         Walk 10 feet on uneven surface  activity   Assist Walk 10 feet on uneven surfaces activity did not occur: Safety/medical concerns          Wheelchair     Assist Will patient use wheelchair at discharge?: No             Wheelchair 50 feet with 2 turns activity    Assist            Wheelchair 150 feet activity     Assist      Assist Level: Independent   BP (!) 148/96 (BP Location: Right Arm)   Pulse (!) 52   Temp 98.2 F (36.8 C)   Resp 18   Ht 5\' 7"  (1.702 m)   Wt 83.2 kg   SpO2 100%   BMI 28.73 kg/m   Medical Problem List and Plan: 1.  Impaired mobility and ADLs secondary to acute CVA with left midbrain infarct  Continue CIR PT, OT, SLP  Pt remains motivated 2.  Antithrombotics: -DVT/anticoagulation:  Pharmaceutical: Lovenox             -antiplatelet therapy: Aspirin 3. Pain Management: Tylenol prn.   Gabapentin 100 3 times daily started 11/26 for?  Neuropathic pain post stroke, decreased to nightly on 11/28 due to? Side effects, with improvement  Right wrist pain-x-ray shows OA, no acute changes, uric acid ordered and is normal   Empiric prednisone started for gout  12/5: improving pain, prednisone 10mg  today, reduce to 5mg  tomorrow 4. Mood: Continue Celexa 20mg  daily for depression 5. Neuropsych: This patient is not fully capable of making decisions on his own behalf. 6. Skin/Wound Care: ZioPatch in place, dry skin- added Eucerin cream 7. Fluids/Electrolytes/Nutrition: Monitor I/Os. Encourage fluid intake  8. HTN: Monitor BP   Decrease Amlodipine to 5mg , DC'd on 11/28.   Continue Lisinopril 40mg  daily, decreased to 10 on 11/28  Generally has been controlled. No changes today 12/5    9. Tobacco abuse: Provide counseling  10. Tachycardia/beats of NSVT: Zio patch in place.   HR in 40-50's, asymptomatic---observe for now 11. AKI  Creatinine 1.54 on 11/28, down to 1.19 on 11/29  IVF initiated on 11/27, DCed  Encourage fluids  -recheck labs Monday 12. ABLA  Hb 11.2 on 11/27, 9.6 on 11/29:     further drop to 9.2 on 12/2. stool OB+ today 12/5  -recheck CBC tomorrow, check  another stool sample for OB tomorrow as well 13.  Leukocytosis  WBCs 13.8 on 11/27, down to 8/6 on 11/29.   UA equivocal, UC returned positive for enterococcus faecalis sensitive to nitrofurantoin- ordered.   CXR normal  Afebrile  Wbc 12.7 12/2---likely steroid related 14.  Hyponatremia  Sodium 130 on 11/28, 132 on 11/29---recheck Monday 15.  Post stroke dysphagia  D2 thins  Advance diet as tolerated per SLP  LOS: 11 days A FACE TO FACE EVALUATION WAS PERFORMED  Meredith Staggers 07/08/2020, 10:59 AM

## 2020-07-09 ENCOUNTER — Inpatient Hospital Stay (HOSPITAL_COMMUNITY): Payer: Medicare Other

## 2020-07-09 ENCOUNTER — Inpatient Hospital Stay (HOSPITAL_COMMUNITY): Payer: Medicare Other | Admitting: Speech Pathology

## 2020-07-09 ENCOUNTER — Inpatient Hospital Stay (HOSPITAL_COMMUNITY): Payer: Medicare Other | Admitting: Occupational Therapy

## 2020-07-09 DIAGNOSIS — I6389 Other cerebral infarction: Secondary | ICD-10-CM | POA: Diagnosis not present

## 2020-07-09 LAB — BASIC METABOLIC PANEL
Anion gap: 8 (ref 5–15)
BUN: 26 mg/dL — ABNORMAL HIGH (ref 8–23)
CO2: 22 mmol/L (ref 22–32)
Calcium: 9.1 mg/dL (ref 8.9–10.3)
Chloride: 104 mmol/L (ref 98–111)
Creatinine, Ser: 1.16 mg/dL (ref 0.61–1.24)
GFR, Estimated: >60 mL/min (ref >60)
Glucose, Bld: 97 mg/dL (ref 70–99)
Potassium: 5.1 mmol/L (ref 3.5–5.1)
Sodium: 134 mmol/L — ABNORMAL LOW (ref 135–145)

## 2020-07-09 LAB — CBC
HCT: 29.5 % — ABNORMAL LOW (ref 39.0–52.0)
Hemoglobin: 9.5 g/dL — ABNORMAL LOW (ref 13.0–17.0)
MCH: 30.1 pg (ref 26.0–34.0)
MCHC: 32.2 g/dL (ref 30.0–36.0)
MCV: 93.4 fL (ref 80.0–100.0)
Platelets: 333 10*3/uL (ref 150–400)
RBC: 3.16 MIL/uL — ABNORMAL LOW (ref 4.22–5.81)
RDW: 13 % (ref 11.5–15.5)
WBC: 18.5 10*3/uL — ABNORMAL HIGH (ref 4.0–10.5)
nRBC: 0.1 % (ref 0.0–0.2)

## 2020-07-09 LAB — OCCULT BLOOD X 1 CARD TO LAB, STOOL: Fecal Occult Bld: POSITIVE — AB

## 2020-07-09 LAB — URIC ACID: Uric Acid, Serum: 7.2 mg/dL (ref 3.7–8.6)

## 2020-07-09 MED ORDER — GABAPENTIN 100 MG PO CAPS
100.0000 mg | ORAL_CAPSULE | Freq: Two times a day (BID) | ORAL | Status: DC
  Administered 2020-07-09 – 2020-07-18 (×19): 100 mg via ORAL
  Filled 2020-07-09 (×20): qty 1

## 2020-07-09 MED ORDER — DICLOFENAC SODIUM 1 % EX GEL
2.0000 g | Freq: Four times a day (QID) | CUTANEOUS | Status: DC
  Administered 2020-07-09 – 2020-07-18 (×26): 2 g via TOPICAL
  Filled 2020-07-09: qty 100

## 2020-07-09 MED ORDER — PREDNISONE 5 MG PO TABS
10.0000 mg | ORAL_TABLET | Freq: Every day | ORAL | Status: DC
  Administered 2020-07-09 – 2020-07-12 (×4): 10 mg via ORAL
  Filled 2020-07-09 (×3): qty 2

## 2020-07-09 NOTE — Progress Notes (Signed)
Speech Language Pathology Daily Session Note  Patient Details  Name: Andres White MRN: [id number] Date of Birth: Feb 29, 1946  Today's Date: 07/09/2020 SLP Individual Time: 0725-0820 SLP Individual Time Calculation (min): 55 min  Short Term Goals: Week 2: SLP Short Term Goal 1 (Week 2): Pt will self-monitor and correct errors during functional tasks wth Min verbal cues. SLP Short Term Goal 2 (Week 2): Pt will recall new and/or daily information with Min A for use of compensatory strategies or aids. SLP Short Term Goal 3 (Week 2): Pt will recall 2 safety precuations with Min A cues for use of aids or strategies. SLP Short Term Goal 4 (Week 2): Pt will demonstrate ability to problem solving basic to mildly complex functional tasks with Min A verbal/visual cues. SLP Short Term Goal 5 (Week 2): Patient will consume current diet with minimal overt s/s of aspiration with Mod I for use of swallowing compensatory strategies. SLP Short Term Goal 6 (Week 2): Patient will demonstrate efficient mastication and complete oral clearance without overt s/s of aspiration with trials of Dys. 3 textures over 2 sessions prior to upgrade.  Skilled Therapeutic Interventions: Skilled treatment session focused on dysphagia and cognitive goals. SLP facilitated session by providing trials of Dys. 3 textures. Patient demonstrated efficient mastication and complete oral clearance without overt s/s of aspiration. Recommend trial tray prior to upgrade. SLP also facilitated session by assessing patient's reading comprehension. Patient demonstrated difficulty naming letters and was unable to decode the word level but could recognize words when given context in the form of objects from a field of 2. 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 Patient spent time educating SLP how he compensated for decreased reading ability when taking medications, paying bills and utilizing  his telephone. Patient left upright in bed with alarm on and all needs within reach. Continue with current plan of care.      Pain No/Denies Pain   Therapy/Group: Individual Therapy  Yoona Ishii 07/09/2020, 12:46 PM

## 2020-07-09 NOTE — Progress Notes (Signed)
Occupational Therapy Session Note  Patient Details  Name: Andres White MRN: [id number] Date of Birth: March 17, 1946  Today's Date: 07/09/2020 OT Individual Time: 9357-0177 OT Individual Time Calculation (min): 32 min  and Today's Date: 07/09/2020 OT Missed Time: 28 Minutes Missed Time Reason: Patient fatigue;Pain  Short Term Goals: Week 2:  OT Short Term Goal 1 (Week 2): Pt will complete toilet transfer with min A OT Short Term Goal 2 (Week 2): Pt will complete 1/3 toileting steps OT Short Term Goal 3 (Week 2): Pt will maintain standing balance with min A within BADL task  Skilled Therapeutic Interventions/Progress Updates:    Patient greeted semi-reclined in bed. Pt reported pain in R knee and declined any OOB activity. OT offered bathing/dressing at bed level and EOB, but patient refused. Pt agreeable to bed level UB there-ex. OT issued orange theraband and pt completed 3 sets of 10 seated row, triceps press, and chest pull. Pt reported need to urinate and was able to manage pants and place urinal for continent void. Pt declined any further participation despite encouragement. PT left semi-reclined in bed with bed alarm on, call bell in reach, and needs met.   Therapy Documentation Precautions:  Precautions Precautions: Fall Precaution Comments: watch HR Restrictions Weight Bearing Restrictions: No General: General OT Amount of Missed Time: 28 MinutesPain:  7/10 pain in R knee. Rest and repositioned for pain management  Therapy/Group: Individual Therapy  Valma Cava 07/09/2020, 2:13 PM

## 2020-07-09 NOTE — Progress Notes (Addendum)
PT notified me that c/o pain to right foot and had difficulty walking today during session. Right foot/leg are hypersensive but c/o of burning or tingling sensation just reports that it hurts. Will inform PA. Notified pa, new orders received.

## 2020-07-09 NOTE — Progress Notes (Signed)
Physical Therapy Session Note  Patient Details  Name: Andres White MRN: [id number] Date of Birth: 11-23-1945  Today's Date: 07/09/2020 PT Individual Time: 1115-1200 PT Individual Time Calculation (min): 45 min   Short Term Goals: Week 2:  PT Short Term Goal 1 (Week 2): Pt will perform bed mobility with CGA PT Short Term Goal 2 (Week 2): Pt will perform bed to chair transfer consistently with CGA. PT Short Term Goal 3 (Week 2): Pt will ambulate 150' with CGA and LRAD.  Skilled Therapeutic Interventions/Progress Updates:    Pt greeted supine in bed, awake and agreeable to PT session. He reports 8/10 R knee and ankle/foot pain. Pt described pain as burning and achy, tender to palpation on dorsal foot and reports hypersensitivity. Team is aware. Rest breaks and mobility provided for pain management. He was able to don L sock with setupA but required totalA for R sock due to pain. He required minA for supine<>sit with HOB elevated and use of bedrail. Able to scoot forwards at EOB with CGA. Attempted sit<>stand with lowered EOB to RW however he was unable to clear hips from bed. Even with an elevated EOB, he was unable to produce enough hip extension to clear hips. Abandoned RW and performed squat<>pivot transfer with a heavy maxA from EOB to w/c with cues for setup, sequencing, and technique. Pt transported for time management to ortho gym. Performed stand<>pivot transfer with maxA and no AD via face-to-face technique from w/c to Nustep. He completed a total of x17min at workload of 4 with ~30 steps/minute frequency. He required x3 lengthy seated rest breaks to complete 5 minutes of work due to fatigue. He completed stand<>pivot with maxA with similar technique as above from nustep to w/c. Pt then returned to his room with totalA and performed an additional stand<>pivot with maxA from w/c to EOB. He was able to perform supine scooting with setupA with HOB flat and use of bedrail. He remained semi-reclined  in bed at end of session with bed alarm on.  Therapy Documentation Precautions:  Precautions Precautions: Fall Precaution Comments: watch HR Restrictions Weight Bearing Restrictions: No  Therapy/Group: Individual Therapy  Querida Beretta P Doris Mcgilvery PT 07/09/2020, 7:49 AM

## 2020-07-09 NOTE — Progress Notes (Signed)
Pre-renal azotemia improved but note upward trend in potassium. Question ACE medicated. Will recheck labs in am for recheck. He is reporting right foot pain today with hypersensitivity. Steroids have been weaned down to 5 mg. Will order Xray for follow up--as had similar symptoms with inability to WB on foot post admission 11/26 and increase gabapentin to 100 mg bid.

## 2020-07-09 NOTE — Progress Notes (Signed)
Physical Therapy Session Note  Patient Details  Name: Andres White MRN: [id number] Date of Birth: 1946-07-16  Today's Date: 07/09/2020 PT Individual Time: 0902-0944 PT Individual Time Calculation (min): 42 min   Short Term Goals: Week 2:  PT Short Term Goal 1 (Week 2): Pt will perform bed mobility with CGA PT Short Term Goal 2 (Week 2): Pt will perform bed to chair transfer consistently with CGA. PT Short Term Goal 3 (Week 2): Pt will ambulate 150' with CGA and LRAD.  Skilled Therapeutic Interventions/Progress Updates:     Pt received supine in bed and agrees to therapy. No complaint of pain initially. Supine to sit with supervision and use of bed features. Pt performs stand pivot transfer to [facility] with modA and exhibits significant pain reaction when putting weight through both feet. Pt also presents with slight edema in R foot. WC transport to gym for time management. Pt attempts to stand multiple reps with RW and is able to complete with modA, but ambulation unable to be attempted due to pain in feet and safety impairments due to pain. Pt stands with very forward flexed posture and unable to maintain safe body mechanics to progress ambulation.   Pt performs squat pivot to mat table with modA. Seated at edge of mat, pt performs NMR for sitting balance and pelvic/hip movements for balance reactions. Pt reaches to the R, using exercise ball to promote extension through R hemibody and allow for more effective weight shifting. Pt reaches for bean bags and transfers to L hand, then rotates to place bag in bucket. Pt then performs same activity, reaching to the R but rotating to grab bag with L hand. PT provides multimodal cues for body mechanics and proper sequencing. Squat pivot from mat>WC>bed with modA. Pt left supine in bed with alarm intact and all needs within reach.  Therapy Documentation Precautions:  Precautions Precautions: Fall Precaution Comments: watch HR Restrictions Weight Bearing  Restrictions: No    Therapy/Group: Individual Therapy  Breck Coons, PT, DPT 07/09/2020, 12:27 PM

## 2020-07-09 NOTE — Progress Notes (Signed)
[facility] PHYSICAL MEDICINE & REHABILITATION PROGRESS NOTE  Subjective/Complaints: No fevere or chills , breathing and bowels ok   C/o RIght elbow knee , ankle and foot pain   ROS: Patient denies CP, SOB, N/V/D  Objective: Vital Signs: Blood pressure (!) 113/57, pulse (!) 58, temperature 98.7 F (37.1 C), resp. rate 16, height 5\' 7"  (1.702 m), weight 83.2 kg, SpO2 99 %. No results found. Recent Labs    07/09/20 0637  WBC 18.5*  HGB 9.5*  HCT 29.5*  PLT 333   Recent Labs    07/09/20 0637  NA 134*  K 5.1  CL 104  CO2 22  GLUCOSE 97  BUN 26*  CREATININE 1.16  CALCIUM 9.1    Intake/Output Summary (Last 24 hours) at 07/09/2020 1024 Last data filed at 07/09/2020 3557 Gross per 24 hour  Intake 837 ml  Output 1525 ml  Net -688 ml        Physical Exam: BP (!) 113/57 (BP Location: Left Arm)   Pulse (!) 58   Temp 98.7 F (37.1 C)   Resp 16   Ht 5\' 7"  (1.702 m)   Wt 83.2 kg   SpO2 99%   BMI 28.73 kg/m   General: No acute distress Mood and affect are appropriate Heart: Regular rate and rhythm no rubs murmurs or extra sounds Lungs: Clear to auscultation, breathing unlabored, no rales or wheezes Abdomen: Positive bowel sounds, soft nontender to palpation, nondistended Extremities: No clubbing, cyanosis, or edema Skin: No evidence of breakdown, no evidence of rash Neurologic: Cranial nerves II through XII intact, motor strength is 5/5 in bilateral deltoid, bicep, tricep, grip, hip flexor, knee extensors, ankle dorsiflexor and plantar flexor Sensory exam normal sensation to light touch and proprioception in bilateral upper and lower extremities Cerebellar exam normal finger to nose to finger as well as heel to shin in bilateral upper and lower extremities Musculoskeletal: Right foot pain, 1st MTP swelling as well as R ankle swelling, pain with R knee ROM no sig effusion, also with tenderness R olecranon bursa, msmall bursal fluid collection   \ dysarthric Motor:  LUE/LLE: 5/5 proximal to distal RUE:   3+/5 proximal to distal with ataxia    RLE: 2+ to 3- /5 proximal to distal  Assessment/Plan: 1. Functional deficits which require 3+ hours per day of interdisciplinary therapy in a comprehensive inpatient rehab setting.  Physiatrist is providing close team supervision and 24 hour management of active medical problems listed below.  Physiatrist and rehab team continue to assess barriers to discharge/monitor patient progress toward functional and medical goals   Care Tool:  Bathing    Body parts bathed by patient: Abdomen, Chest, Front perineal area, Left upper leg, Right upper leg, Face, Right arm   Body parts bathed by helper: Right lower leg, Left lower leg, Buttocks, Left arm     Bathing assist Assist Level: Moderate Assistance - Patient 50 - 74%     Upper Body Dressing/Undressing Upper body dressing   What is the patient wearing?: Pull over shirt    Upper body assist Assist Level: Minimal Assistance - Patient > 75%    Lower Body Dressing/Undressing Lower body dressing      What is the patient wearing?: Pants, Incontinence brief     Lower body assist Assist for lower body dressing: Moderate Assistance - Patient 50 - 74%     Toileting Toileting    Toileting assist Assist for toileting: Moderate Assistance - Patient 50 - 74%  Transfers Chair/bed transfer  Transfers assist  Chair/bed transfer activity did not occur: Safety/medical concerns  Chair/bed transfer assist level: Moderate Assistance - Patient 50 - 74%     Locomotion Ambulation   Ambulation assist   Ambulation activity did not occur: Safety/medical concerns  Assist level: Minimal Assistance - Patient > 75% Assistive device: Walker-rolling Max distance: 110'   Walk 10 feet activity   Assist  Walk 10 feet activity did not occur: Safety/medical concerns  Assist level: Minimal Assistance - Patient > 75% Assistive device: Walker-rolling   Walk 50  feet activity   Assist Walk 50 feet with 2 turns activity did not occur: Safety/medical concerns  Assist level: Minimal Assistance - Patient > 75% Assistive device: Walker-rolling    Walk 150 feet activity   Assist Walk 150 feet activity did not occur: Safety/medical concerns         Walk 10 feet on uneven surface  activity   Assist Walk 10 feet on uneven surfaces activity did not occur: Safety/medical concerns         Wheelchair     Assist Will patient use wheelchair at discharge?: No             Wheelchair 50 feet with 2 turns activity    Assist            Wheelchair 150 feet activity     Assist      Assist Level: Independent   BP (!) 113/57 (BP Location: Left Arm)   Pulse (!) 58   Temp 98.7 F (37.1 C)   Resp 16   Ht 5\' 7"  (1.702 m)   Wt 83.2 kg   SpO2 99%   BMI 28.73 kg/m   Medical Problem List and Plan: 1.  Impaired mobility and ADLs secondary to acute CVA with left midbrain infarct  Continue CIR PT, OT, SLP  Pt remains motivated 2.  Antithrombotics: -DVT/anticoagulation:  Pharmaceutical: Lovenox             -antiplatelet therapy: Aspirin 3. Pain Management: Tylenol prn.   Gabapentin 100 3 times daily started 11/26 for?  Neuropathic pain post stroke, decreased to nightly on 11/28 due to? 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 Mood: Continue Celexa 20mg  daily for depression 5. Neuropsych: This patient is not fully capable of making decisions on his own behalf. 6. Skin/Wound Care: ZioPatch in place, dry skin- added Eucerin cream 7. Fluids/Electrolytes/Nutrition:  Monitor I/Os. Encourage fluid intake  8. HTN: Monitor BP   Decrease Amlodipine to 5mg , DC'd on 11/28.   Continue Lisinopril 40mg  daily, decreased to 10 on 11/28  Generally has been controlled. No changes today 12/5    9. Tobacco abuse: Provide counseling  10. Tachycardia/beats of NSVT: Zio patch in place.   HR in 40-50's, asymptomatic---observe for now 11. AKI  Creatinine 1.54 on 11/28, down to 1.19 on 11/29  IVF initiated on 11/27, DCed  Encourage fluids, will bolus 230ml on 12/6  -recheck labs Monday 12. ABLA  Hb 11.2 on 11/27, 9.6 on 11/29:     further drop to 9.2 on 12/2. stool OB+ today 12/5  -recheck CBC tomorrow, check  another stool sample for OB tomorrow as well 13.  Leukocytosis steroids plus UTI   WBCs 18.5   UA equivocal, UC returned positive for enterococcus faecalis sensitive to nitrofurantoin- ordered.   CXR normal  Afebrile  Wbc 12.7 12/2---likely steroid related 14.  Hyponatremia  Sodium 130 on 11/28, 132 on 11/29---recheck Monday 15.  Post stroke dysphagia  D2 thins  Advance diet as tolerated per SLP  LOS: 12 days A FACE TO FACE EVALUATION WAS PERFORMED  Charlett Blake 07/09/2020, 10:24 AM

## 2020-07-10 ENCOUNTER — Inpatient Hospital Stay (HOSPITAL_COMMUNITY): Payer: Medicare Other

## 2020-07-10 ENCOUNTER — Inpatient Hospital Stay (HOSPITAL_COMMUNITY): Payer: Medicare Other | Admitting: Physical Therapy

## 2020-07-10 ENCOUNTER — Inpatient Hospital Stay (HOSPITAL_COMMUNITY): Payer: Medicare Other | Admitting: Occupational Therapy

## 2020-07-10 ENCOUNTER — Inpatient Hospital Stay (HOSPITAL_COMMUNITY): Payer: Medicare Other | Admitting: Speech Pathology

## 2020-07-10 DIAGNOSIS — I69391 Dysphagia following cerebral infarction: Secondary | ICD-10-CM | POA: Diagnosis not present

## 2020-07-10 DIAGNOSIS — I10 Essential (primary) hypertension: Secondary | ICD-10-CM | POA: Diagnosis not present

## 2020-07-10 DIAGNOSIS — M792 Neuralgia and neuritis, unspecified: Secondary | ICD-10-CM | POA: Diagnosis not present

## 2020-07-10 DIAGNOSIS — M109 Gout, unspecified: Secondary | ICD-10-CM | POA: Diagnosis not present

## 2020-07-10 LAB — BASIC METABOLIC PANEL
Anion gap: 10 (ref 5–15)
BUN: 31 mg/dL — ABNORMAL HIGH (ref 8–23)
CO2: 21 mmol/L — ABNORMAL LOW (ref 22–32)
Calcium: 9.2 mg/dL (ref 8.9–10.3)
Chloride: 103 mmol/L (ref 98–111)
Creatinine, Ser: 1.27 mg/dL — ABNORMAL HIGH (ref 0.61–1.24)
GFR, Estimated: 59 mL/min — ABNORMAL LOW (ref >60)
Glucose, Bld: 110 mg/dL — ABNORMAL HIGH (ref 70–99)
Potassium: 4.6 mmol/L (ref 3.5–5.1)
Sodium: 134 mmol/L — ABNORMAL LOW (ref 135–145)

## 2020-07-10 NOTE — Patient Care Conference (Signed)
Inpatient RehabilitationTeam Conference and Plan of Care Update Date: 07/10/2020   Time: 10:38 AM    Patient Name: Andres White      Medical Record Number: [id number]  Date of Birth: [birth year] Sex: Male         Room/Bed: 4W25C/4W25C-01 Payor Info: Payor: [employer] MEDICARE / Plan: [employer] MEDICARE / Product Type: *No Product type* /    Admit Date/Time:  06/27/2020  1:15 PM  Primary Diagnosis:  CVA (cerebral vascular accident) [facility])  Hospital Problems: Principal Problem:   CVA (cerebral vascular accident) ([facility]) Active Problems:   Hypertension   Faintness   Chest discomfort   Left carotid stenosis   Right leg weakness   Stroke (cerebrum) (HCC)   Brainstem infarct, acute (HCC)   Acute blood loss anemia   AKI (acute kidney injury) ([place])   Tachycardia   Dysesthesia   Neuropathic pain   Drug-induced hypotension   Leukocytosis   Hyponatremia   History of hypertension   Dysphagia, post-stroke    Expected Discharge Date: Expected Discharge Date: 07/18/20  Team Members Present: Physician leading conference: Dr. Alger Simons Care Coodinator Present: Loralee Pacas, LCSWA;Cullen Vanallen Creig Hines, RN, BSN, CRRN Nurse Present: Isla Pence, RN PT Present: Tereasa Coop, PT OT Present: Elisabeth Most, OT SLP Present: Weston Anna, SLP PPS Coordinator present : Gunnar Fusi, SLP     Current Status/Progress Goal Weekly Team Focus  Bowel/Bladder   Pt is continent of b/b, LBM 07/09/20  Maintain continence  Q2h toileting?PRN   Swallow/Nutrition/ Hydration   Dys. 2 textures with thin liquids, Mod I  Mod I  trial tray of Dys. 3 textures   ADL's   Mod A overall, much improved transfers and BADL participation  supervision/CGA  transfers, sit<>stands, self-care retraining, activity tolerance, dc planning, pt/family education   Mobility   supervision bed mobility, can be minA for transfers and ambulation up to 100' with RW, but at times mod/maxA for transfers and unable to ambulate  due to pain in both feet  Supervision  balance, ambulation, cognition, R hemibody NMR   Communication             Safety/Cognition/ Behavioral Observations  Min-Mod A  Supervision  basic problem solving, recall   Pain   Pt denies pain at this time.  pain will remain less than  Assess pain Qshift/PRN   Skin   Skin intact  Maintain skin integrity  Assess skin qshift/PRN     Discharge Planning:  D/c to home with his daughter. Pt will ahve 24/7 support from various family members (daughter and her children).   Team Discussion: Patient continent B/B, encouraging fluids. OT reports patient goals are supervision. Currently is a mod assist overall but has improved with transfers and participation. PT reports patient has pain in both feet, and unable to ambulate. Supervision for bed mobility, can be min assist for transfers and ambulate up to 100' with a RW. At times he requires mod-max assist for transfers. Goals are for supervision. SLP reports that the patient states he can't read but the daughter says that he could. Don't know his baseline. At this point he cannot read and they are working on basic low level things. MD reports he is still having gout pain in the wrist.  Patient on target to meet rehab goals: yes  *See Care Plan and progress notes for long and short-term goals.   Revisions to Treatment Plan:  Currently weaning prednisone as long as pain permits.   Teaching Needs: Continue  with family education.  Current Barriers to Discharge: Decreased caregiver support, Medical stability, Home enviroment access/layout, Lack of/limited family support, Behavior, Nutritional means and pain.  Possible Resolutions to Barriers: Continue with current medications, offer nutritional supplementation, continue to encourage fluids, continue to offer prn pain medications, provide emotional support to patient and family.     Medical Summary Current Status: left midbrain infarct, still with joint  pain/gout. trying to wean prednisone , pain permitting. anemia stable although stools Heme + x 2.  Barriers to Discharge: Medical stability   Possible Resolutions to [employer] Focus: regular lab mgt, pain control/prednisone rx, monitoring stool for blood. improve nutrition   Continued Need for Acute Rehabilitation Level of Care: The patient requires daily medical management by a physician with specialized training in physical medicine and rehabilitation for the following reasons: Direction of a multidisciplinary physical rehabilitation program to maximize functional independence : Yes Medical management of patient stability for increased activity during participation in an intensive rehabilitation regime.: Yes Analysis of laboratory values and/or radiology reports with any subsequent need for medication adjustment and/or medical intervention. : Yes   I attest that I was present, lead the team conference, and concur with the assessment and plan of the team.   Cristi Loron 07/10/2020, 1:52 PM

## 2020-07-10 NOTE — Progress Notes (Signed)
Physical Therapy Session Note  Patient Details  Name: Andres White MRN: [id number] Date of Birth: 1945/12/02  Today's Date: 07/10/2020 PT Individual Time: 2552-5894 PT Individual Time Calculation (min): 70 min   Short Term Goals: Week 2:  PT Short Term Goal 1 (Week 2): Pt will perform bed mobility with CGA PT Short Term Goal 2 (Week 2): Pt will perform bed to chair transfer consistently with CGA. PT Short Term Goal 3 (Week 2): Pt will ambulate 150' with CGA and LRAD.  Skilled Therapeutic Interventions/Progress Updates: Pt presented in w/c with dgt present agreeable to therapy. Pt c/o mild pain in R foot with activity. Rest breaks provided as needed. Pt propelled using BUE to rehab gym with x 3 rest breaks provided due to fatigue. Pt then transported remaining distance to ortho gym. Performed stand pivot transfer w/c to mat with RW and CGA. Participated in standing activities with RW including horseshoe toss on level tile, then with single LE on 4in step and throwing horseshoe with contralateral extremity. Pt also participated in bed mobility on flat mat with dgt present. Pt was able to perform sit to/from supine with supervision. Pt also participated in static standing balance on Airex. Pt required minA to step onto Airex and was initially min A for static standing progressing to CGA. Pt then incorporated alternating shoulder flexion x 10 bilaterally with CGA. Pt instructed to step backwards to step off Airex however unable to follow commands and performed uncontrolled sit to mat despite max multimodal cues. After seated rest pt stood and stepped back onto Airex, pt then performed static stand with PTA performing gentle permutations. Pt was able to follow commands and step backwards onto level tile. Pt then performed ambulatory transfer to w/c (approx 51f) with PTA providing max cues when pt attempted to walk backwards from mat vs ambulating towards w/c then turning. Pt transported back to room at end  of session and performed ambulatory transfer to bed with CGA. Performed sit to supine with Supervision and pt left in bed with bed alarm on, call bell within reach and current needs met. Per dgt no additional queries at end of session and thankful to see pt's current status.      Therapy Documentation Precautions:  Precautions Precautions: Fall Precaution Comments: watch HR Restrictions Weight Bearing Restrictions: No General:   Vital Signs: Therapy Vitals Temp: 97.6 F (36.4 C) Temp Source: Oral Pulse Rate: 73 Resp: 15 BP: 106/76 Patient Position (if appropriate): Sitting Oxygen Therapy SpO2: 100 % O2 Device: Room Air    Therapy/Group: Individual Therapy  Telvin Reinders  Michele Kerlin, PTA  07/10/2020, 3:49 PM

## 2020-07-10 NOTE — Progress Notes (Signed)
Physical Therapy Session Note  Patient Details  Name: Andres White MRN: [id number] Date of Birth: [birth year]  Today's Date: 07/10/2020 PT Individual Time: 2563-8937 PT Individual Time Calculation (min): 41 min   Short Term Goals: Week 2:  PT Short Term Goal 1 (Week 2): Pt will perform bed mobility with CGA PT Short Term Goal 2 (Week 2): Pt will perform bed to chair transfer consistently with CGA. PT Short Term Goal 3 (Week 2): Pt will ambulate 150' with CGA and LRAD.  Skilled Therapeutic Interventions/Progress Updates:     Pt received seated in [facility] and agrees to therapy. Initially does not complain of pain but when questioned more specifically, admits to pain in feet. PT palpates both feet and notes significant tenderness to moderate pressure in mid/lateral foot. PT provides mobility and rest breaks to manage pain. Daughter present for modified family education.  Pt transported to gym for time management and energy conservation. Pt performs multiple reps of sit to stand during session, requiring CGA and verbal cues on hand placement and body mechanics. Pt ambulates with RW and minA 17', 50', and 90'. Pt has very antalgic gait pattern that improves with increased distance and time on feet. PT provides multimodal cuing for upright posture, hip extension, improved safety with RW, and positioning for safe transfer back to chair. Pt requires extended seated rest breaks between each bout. Pt left seated in WC with alarm intact and all needs within reach.  Therapy Documentation Precautions:  Precautions Precautions: Fall Precaution Comments: watch HR Restrictions Weight Bearing Restrictions: No   Therapy/Group: Individual Therapy  Breck Coons, PT, DPT 07/10/2020, 12:13 PM

## 2020-07-10 NOTE — Progress Notes (Signed)
[place] PHYSICAL MEDICINE & REHABILITATION PROGRESS NOTE  Subjective/Complaints: Up with SLP counting money. Had a long day yesterday. Said he needed a nap when he was done!  ROS: Patient denies fever, rash, sore throat, blurred vision, nausea, vomiting, diarrhea, cough, shortness of breath or chest pain, joint or back pain, headache, or mood change.    Objective: Vital Signs: Blood pressure 137/72, pulse (!) 43, temperature 97.6 F (36.4 C), resp. rate 16, height 5\' 7"  (1.702 m), weight 83.2 kg, SpO2 100 %. DG Foot Complete Right  Result Date: 07/09/2020 CLINICAL DATA:  Right foot pain EXAM: RIGHT FOOT COMPLETE - 3+ VIEW COMPARISON:  None. FINDINGS: Advanced degenerative changes at the 1st MTP joint with mild hallux valgus deformity. Mild degenerative changes in the tarsal region. No acute bony abnormality. Specifically, no fracture, subluxation, or dislocation. IMPRESSION: Degenerative changes in the right foot as above. No acute bony abnormality. Electronically Signed   By: Rolm Baptise M.D.   On: 07/09/2020 11:00   Recent Labs    07/09/20 0637  WBC 18.5*  HGB 9.5*  HCT 29.5*  PLT 333   Recent Labs    07/09/20 0637 07/10/20 0448  NA 134* 134*  K 5.1 4.6  CL 104 103  CO2 22 21*  GLUCOSE 97 110*  BUN 26* 31*  CREATININE 1.16 1.27*  CALCIUM 9.1 9.2    Intake/Output Summary (Last 24 hours) at 07/10/2020 0908 Last data filed at 07/10/2020 0856 Gross per 24 hour  Intake 1200 ml  Output 925 ml  Net 275 ml        Physical Exam: BP 137/72 (BP Location: Left Arm)   Pulse (!) 43   Temp 97.6 F (36.4 C)   Resp 16   Ht 5\' 7"  (1.702 m)   Wt 83.2 kg   SpO2 100%   BMI 28.73 kg/m   Constitutional: No distress . Vital signs reviewed. HEENT: EOMI, oral membranes moist Neck: supple Cardiovascular: RRR without murmur. No JVD    Respiratory/Chest: crackles lower half right lung. Normal effort    GI/Abdomen: BS +, non-tender, non-distended Ext: no clubbing, cyanosis,  or edema Psych: pleasant and cooperative Musculoskeletal: Right foot pain resolved, 1st MTP swelling as well as R ankle swelling minimal, pain with R knee ROM no sig effusion, also with tenderness R olecranon bursa, small bursal fluid collection  dysarthric Motor: LUE/LLE: 5/5 proximal to distal RUE:   3+/5 proximal to distal with ataxia    RLE:  3- to3 /5 proximal to distal  Assessment/Plan: 1. Functional deficits which require 3+ hours per day of interdisciplinary therapy in a comprehensive inpatient rehab setting.  Physiatrist is providing close team supervision and 24 hour management of active medical problems listed below.  Physiatrist and rehab team continue to assess barriers to discharge/monitor patient progress toward functional and medical goals   Care Tool:  Bathing    Body parts bathed by patient: Abdomen, Chest, Front perineal area, Left upper leg, Right upper leg, Face, Right arm   Body parts bathed by helper: Right lower leg, Left lower leg, Buttocks, Left arm     Bathing assist Assist Level: Moderate Assistance - Patient 50 - 74%     Upper Body Dressing/Undressing Upper body dressing   What is the patient wearing?: Pull over shirt    Upper body assist Assist Level: Minimal Assistance - Patient > 75%    Lower Body Dressing/Undressing Lower body dressing      What is the patient wearing?: Pants,  Incontinence brief     Lower body assist Assist for lower body dressing: Moderate Assistance - Patient 50 - 74%     Toileting Toileting    Toileting assist Assist for toileting: Moderate Assistance - Patient 50 - 74%     Transfers Chair/bed transfer  Transfers assist  Chair/bed transfer activity did not occur: Safety/medical concerns  Chair/bed transfer assist level: Moderate Assistance - Patient 50 - 74%     Locomotion Ambulation   Ambulation assist   Ambulation activity did not occur: Safety/medical concerns  Assist level: Minimal Assistance -  Patient > 75% Assistive device: Walker-rolling Max distance: 110'   Walk 10 feet activity   Assist  Walk 10 feet activity did not occur: Safety/medical concerns  Assist level: Minimal Assistance - Patient > 75% Assistive device: Walker-rolling   Walk 50 feet activity   Assist Walk 50 feet with 2 turns activity did not occur: Safety/medical concerns  Assist level: Minimal Assistance - Patient > 75% Assistive device: Walker-rolling    Walk 150 feet activity   Assist Walk 150 feet activity did not occur: Safety/medical concerns         Walk 10 feet on uneven surface  activity   Assist Walk 10 feet on uneven surfaces activity did not occur: Safety/medical concerns         Wheelchair     Assist Will patient use wheelchair at discharge?: No             Wheelchair 50 feet with 2 turns activity    Assist            Wheelchair 150 feet activity     Assist      Assist Level: Independent   BP 137/72 (BP Location: Left Arm)   Pulse (!) 43   Temp 97.6 F (36.4 C)   Resp 16   Ht 5\' 7"  (1.702 m)   Wt 83.2 kg   SpO2 100%   BMI 28.73 kg/m   Medical Problem List and Plan: 1.  Impaired mobility and ADLs secondary to acute CVA with left midbrain infarct  Continue CIR PT, OT, SLP  Team conference today 2.  Antithrombotics: -DVT/anticoagulation:  Pharmaceutical: Lovenox             -antiplatelet therapy: Aspirin 3. Pain Management: Tylenol prn.   Gabapentin 100 3 times daily started 11/26 for?  Neuropathic pain post stroke, decreased to nightly on 11/28 due to? Side effects, with improvement  Right wrist pain-x-ray shows OA, no acute changes, uric acid ordered and is normal   Empiric prednisone started for gout  12/7 gout pain flared with reduction to 5mg , continue at 10mg  for now   -no pain today   -UA level normal 4. Mood: Continue Celexa 20mg  daily for depression 5. Neuropsych: This patient is not fully capable of making decisions on  his own behalf. 6. Skin/Wound Care: ZioPatch in place, dry skin- added Eucerin cream 7. Fluids/Electrolytes/Nutrition: Monitor I/Os. Encourage fluid intake  8. HTN: Monitor BP   Decrease Amlodipine to 5mg , DC'd on 11/28.   Continue Lisinopril 40mg  daily, decreased to 10 on 11/28  Generally has been controlled. No changes today 12/7    9. Tobacco abuse: Provide counseling  10. Tachycardia/beats of NSVT: Zio patch in place.   HR in 40-50's, asymptomatic---observe for now 11. AKI  Creatinine 1.54 on 11/28, down to 1.19 on 11/29  IVF initiated on 11/27, DCed  Encourage fluids, will bolus 228ml on 12/6  -recheck labs Monday  12. ABLA  Hb 11.2 on 11/27, 9.6 on 11/29:     further drop to 9.2 on 12/2. stool OB+ today 12/5  -hgb up to 9.5 on 12/6, stool OB+ x 2  -12/7 won't pursue further GI unless drop in Hgb. Consider stopping lovenox (walking 100+ ft) 13.  Leukocytosis steroids plus UTI   WBCs 18.5 12/6--recheck tomorrow  UA equivocal, UC returned positive for enterococcus faecalis sensitive to nitrofurantoin- ordered.   Recent CXR normal, but crackles on right today  Remains Afebrile   consider re-check cxr depending upon labs  -Incentive spirometry 14.  Hyponatremia  Sodium 134 12/6 15.  Post stroke dysphagia  D2 thins  Advance diet as tolerated per SLP    LOS: 13 days A FACE TO FACE EVALUATION WAS PERFORMED  Meredith Staggers 07/10/2020, 9:08 AM

## 2020-07-10 NOTE — Progress Notes (Signed)
Occupational Therapy Session Note  Patient Details  Name: Andres White MRN: [id number] Date of Birth: [birth year]  Today's Date: 07/10/2020 OT Individual Time: 0730-0820 OT Individual Time Calculation (min): 50 min  and Today's Date: 07/10/2020 OT Missed Time: 10 Minutes Missed Time Reason: Other (comment);Nursing care (eating breakfast and nursing care)   Short Term Goals: Week 2:  OT Short Term Goal 1 (Week 2): Pt will complete toilet transfer with min A OT Short Term Goal 2 (Week 2): Pt will complete 1/3 toileting steps OT Short Term Goal 3 (Week 2): Pt will maintain standing balance with min A within BADL task   Skilled Therapeutic Interventions/Progress Updates:    Pt greeted at time of session reclined in bed resting agreeable to OT session, no pain throughout session stating he had an injection in his RLE yesterday. Supine to sit Supervision, squat pivot bed > w/c Mod/Max A with cues for hand placement and difficulty sequencing squat pivot instead of standing despite numerous cues and instruction. Stand pivot w/c <> toilet with BSC over toilet with Mod A, blocking RLE. Pt did have (+) void for B&B, CGA for hygiene and Max A for clothing management for brief. Supervision for hand hygiene and washing face at sink seated level. Set up in wheelchair and breakfast tray arrived and RN to perform med pass, pt given a few minutes to eat breakfast and missed 10 minutes of OT. Declined clothing, wanting gown only and Min A to don/doff gown. Set up to finish breakfast with alarm on, call bell in reach.    Therapy Documentation Precautions:  Precautions Precautions: Fall Precaution Comments: watch HR Restrictions Weight Bearing Restrictions: No    Therapy/Group: Individual Therapy  Viona Gilmore 07/10/2020, 8:16 AM

## 2020-07-10 NOTE — Progress Notes (Signed)
Speech Language Pathology Daily Session Note  Patient Details  Name: Andres White MRN: [id number] Date of Birth: 05/15/1946  Today's Date: 07/10/2020 SLP Individual Time: 0835-0900 SLP Individual Time Calculation (min): 25 min  Short Term Goals: Week 2: SLP Short Term Goal 1 (Week 2): Pt will self-monitor and correct errors during functional tasks wth Min verbal cues. SLP Short Term Goal 2 (Week 2): Pt will recall new and/or daily information with Min A for use of compensatory strategies or aids. SLP Short Term Goal 3 (Week 2): Pt will recall 2 safety precuations with Min A cues for use of aids or strategies. SLP Short Term Goal 4 (Week 2): Pt will demonstrate ability to problem solving basic to mildly complex functional tasks with Min A verbal/visual cues. SLP Short Term Goal 5 (Week 2): Patient will consume current diet with minimal overt s/s of aspiration with Mod I for use of swallowing compensatory strategies. SLP Short Term Goal 6 (Week 2): Patient will demonstrate efficient mastication and complete oral clearance without overt s/s of aspiration with trials of Dys. 3 textures over 2 sessions prior to upgrade.  Skilled Therapeutic Interventions: Skilled treatment session focused on cognitive goals. SLP facilitated session by providing extra time and overall Min A verbal cues for basic problem solving during a money management task. Patient's daughter present and reported decreased ability to read is not baseline and a new impairment since admission. Will continue ongoing diagnostic treatment. Patient left upright in the wheelchair with alarm on and all needs within reach. Continue with current plan of care.      Pain No/Denies Pain   Therapy/Group: Individual Therapy  Venora Kautzman 07/10/2020, 10:02 AM

## 2020-07-10 NOTE — Progress Notes (Signed)
Patient ID: Andres White, male   DOB: April 09, 1946, 74 y.o.   MRN: [id number]  SW met with pt and pt dtr Heather in room to provide updates from team conference, and no change in d/c date. Discussed family edu; scheduled for Friday 1pm-3pm and Sun 10am-12pm. SW informed current DME rec is RW, and SW will follow-up if there are any additional items. SW to provide HHA list.   Loralee Pacas, MSW, [place] Office: [phone] Cell: [phone] Fax: [phone]

## 2020-07-11 ENCOUNTER — Inpatient Hospital Stay (HOSPITAL_COMMUNITY): Payer: Medicare Other

## 2020-07-11 ENCOUNTER — Inpatient Hospital Stay (HOSPITAL_COMMUNITY): Payer: Medicare Other | Admitting: Speech Pathology

## 2020-07-11 DIAGNOSIS — I10 Essential (primary) hypertension: Secondary | ICD-10-CM | POA: Diagnosis not present

## 2020-07-11 DIAGNOSIS — M792 Neuralgia and neuritis, unspecified: Secondary | ICD-10-CM | POA: Diagnosis not present

## 2020-07-11 DIAGNOSIS — M7989 Other specified soft tissue disorders: Secondary | ICD-10-CM

## 2020-07-11 DIAGNOSIS — I69391 Dysphagia following cerebral infarction: Secondary | ICD-10-CM | POA: Diagnosis not present

## 2020-07-11 DIAGNOSIS — M109 Gout, unspecified: Secondary | ICD-10-CM | POA: Diagnosis not present

## 2020-07-11 LAB — CBC
HCT: 26.8 % — ABNORMAL LOW (ref 39.0–52.0)
Hemoglobin: 8.9 g/dL — ABNORMAL LOW (ref 13.0–17.0)
MCH: 30.9 pg (ref 26.0–34.0)
MCHC: 33.2 g/dL (ref 30.0–36.0)
MCV: 93.1 fL (ref 80.0–100.0)
Platelets: 332 10*3/uL (ref 150–400)
RBC: 2.88 MIL/uL — ABNORMAL LOW (ref 4.22–5.81)
RDW: 13.2 % (ref 11.5–15.5)
WBC: 18.8 10*3/uL — ABNORMAL HIGH (ref 4.0–10.5)
nRBC: 0 % (ref 0.0–0.2)

## 2020-07-11 MED ORDER — ENSURE ENLIVE PO LIQD
237.0000 mL | Freq: Two times a day (BID) | ORAL | Status: DC
  Administered 2020-07-11 – 2020-07-18 (×14): 237 mL via ORAL
  Filled 2020-07-11 (×2): qty 237

## 2020-07-11 NOTE — Progress Notes (Addendum)
Patient ID: Andres White, male   DOB: Dec 27, 1945, 74 y.o.   MRN: [id number]   SW called [name] [phone]) to inform HHA list left in room and apologized for not leaving in room yesterday. SW informed will order RW and will inform if there is more DME.   SW ordered RW with [employer] via parachute.   Loralee Pacas, MSW, [place] Office: [phone] Cell: [phone] Fax: [phone]

## 2020-07-11 NOTE — Progress Notes (Signed)
Bilateral lower extremity venous study completed.      Please see CV Proc for preliminary results.   Ahsan Esterline, RVT  

## 2020-07-11 NOTE — Progress Notes (Signed)
Nutrition Follow-up  RD working remotely.  DOCUMENTATION CODES:   Not applicable  INTERVENTION:   - Please obtain updated weight, last available weight is from 11/24  - Continue Magic cup TID with meals, each supplement provides 290 kcal and 9 grams of protein  - Add Ensure Enlive po BID, each supplement provides 350 kcal and 20 grams of protein  - Continue MVI with minerals daily  - Encourage adequate PO intake  NUTRITION DIAGNOSIS:   Inadequate oral intake related to  (masticatory difficulty) as evidenced by per patient/family report.  Progressing  GOAL:   Patient will meet greater than or equal to 90% of their needs  Progressing  MONITOR:   PO intake, Supplement acceptance, Labs, Weight trends, Skin, I & O's  REASON FOR ASSESSMENT:   Malnutrition Screening Tool    ASSESSMENT:   HOLDEN MANISCALCO is a 74 y.o. male with PMH of HTN, arthritis, tobacco use, who presented to [facility] on 06/24/20 with 4 day history of right leg weakness, L facial weakness, and speech changes. 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  Unable to reach pt via phone call to room today. Meal completions have improved to 50-100% (averaging ~65%). Pt receiving Magic Cups with meal trays and is taking MVI per [facility] documentation. RD will order Ensure Enlive supplements between meals to aid pt in meeting kcal and protein needs.  No new weighs since admission. Recommend obtaining updated weight.  Diet was advanced from dysphagia 2 to dysphagia 3 this AM by SLP. Hopeful for continued improvements with PO intake since diet has been advanced.  Meal Completion: 50-100%  Medications reviewed and include: MVI with minerals, protonix, prednisone  Labs reviewed: sodium 134, BUN 31, creatinine 1.27, hemoglobin 8.9  Diet Order:   Diet  Order            DIET DYS 3 Room service appropriate? Yes; Fluid consistency: Thin  Diet effective 1000                 EDUCATION NEEDS:   Education needs have been addressed  Skin:  Skin Assessment: Reviewed RN Assessment  Last BM:  07/10/20  Height:   Ht Readings from Last 1 Encounters:  06/27/20 5\' 7"  (1.702 m)    Weight:   Wt Readings from Last 1 Encounters:  06/27/20 83.2 kg    Ideal Body Weight:  67.3 kg  BMI:  Body mass index is 28.73 kg/m.  Estimated Nutritional Needs:   Kcal:  1800-2000  Protein:  100-115 grams  Fluid:  > 1.8 L    Gustavus Bryant, MS, RD, LDN Inpatient Clinical Dietitian Please see AMiON for contact information.

## 2020-07-11 NOTE — Progress Notes (Addendum)
[facility] PHYSICAL MEDICINE & REHABILITATION PROGRESS NOTE  Subjective/Complaints: Slept well. Feet felt better yesterday with increase of prednisone back to 10mg .   ROS: Patient denies fever, rash, sore throat, blurred vision, nausea, vomiting, diarrhea, cough, shortness of breath or chest pain, joint or back pain, headache, or mood change.    Objective: Vital Signs: Blood pressure (!) 112/59, pulse (!) 49, temperature 98.2 F (36.8 C), temperature source Oral, resp. rate 16, height 5\' 7"  (1.702 m), weight 83.2 kg, SpO2 100 %. DG Foot Complete Right  Result Date: 07/09/2020 CLINICAL DATA:  Right foot pain EXAM: RIGHT FOOT COMPLETE - 3+ VIEW COMPARISON:  None. FINDINGS: Advanced degenerative changes at the 1st MTP joint with mild hallux valgus deformity. Mild degenerative changes in the tarsal region. No acute bony abnormality. Specifically, no fracture, subluxation, or dislocation. IMPRESSION: Degenerative changes in the right foot as above. No acute bony abnormality. Electronically Signed   By: Rolm Baptise M.D.   On: 07/09/2020 11:00   Recent Labs    07/09/20 0637 07/11/20 0509  WBC 18.5* 18.8*  HGB 9.5* 8.9*  HCT 29.5* 26.8*  PLT 333 332   Recent Labs    07/09/20 0637 07/10/20 0448  NA 134* 134*  K 5.1 4.6  CL 104 103  CO2 22 21*  GLUCOSE 97 110*  BUN 26* 31*  CREATININE 1.16 1.27*  CALCIUM 9.1 9.2    Intake/Output Summary (Last 24 hours) at 07/11/2020 0852 Last data filed at 07/11/2020 9323 Gross per 24 hour  Intake 2400 ml  Output 850 ml  Net 1550 ml        Physical Exam: BP (!) 112/59 (BP Location: Left Arm)   Pulse (!) 49   Temp 98.2 F (36.8 C) (Oral)   Resp 16   Ht 5\' 7"  (1.702 m)   Wt 83.2 kg   SpO2 100%   BMI 28.73 kg/m   Constitutional: No distress . Vital signs reviewed. HEENT: EOMI, oral membranes moist Neck: supple Cardiovascular: RRR without murmur. No JVD    Respiratory/Chest: crackles on right.. Normal effort    GI/Abdomen: BS +,  non-tender, non-distended Ext: no clubbing, cyanosis, or edema Psych: pleasant and cooperative Musculoskeletal: Right foot pain resolved, 1st MTP swelling as well as R ankle swelling minimal, pain with R knee ROM no sig effusion, also with tenderness R olecranon bursa, small bursal fluid collection  dysarthric Motor: LUE/LLE: 5/5 proximal to distal RUE:   3+/5 proximal to distal with ataxia    RLE:  3- to3 /5 proximal to distal  Assessment/Plan: 1. Functional deficits which require 3+ hours per day of interdisciplinary therapy in a comprehensive inpatient rehab setting.  Physiatrist is providing close team supervision and 24 hour management of active medical problems listed below.  Physiatrist and rehab team continue to assess barriers to discharge/monitor patient progress toward functional and medical goals   Care Tool:  Bathing    Body parts bathed by patient: Abdomen, Chest, Front perineal area, Left upper leg, Right upper leg, Face, Right arm   Body parts bathed by helper: Right lower leg, Left lower leg, Buttocks, Left arm     Bathing assist Assist Level: Moderate Assistance - Patient 50 - 74%     Upper Body Dressing/Undressing Upper body dressing   What is the patient wearing?: Pull over shirt    Upper body assist Assist Level: Minimal Assistance - Patient > 75%    Lower Body Dressing/Undressing Lower body dressing      What  is the patient wearing?: Pants, Incontinence brief     Lower body assist Assist for lower body dressing: Moderate Assistance - Patient 50 - 74%     Toileting Toileting    Toileting assist Assist for toileting: Moderate Assistance - Patient 50 - 74%     Transfers Chair/bed transfer  Transfers assist  Chair/bed transfer activity did not occur: Safety/medical concerns  Chair/bed transfer assist level: Moderate Assistance - Patient 50 - 74%     Locomotion Ambulation   Ambulation assist   Ambulation activity did not occur:  Safety/medical concerns  Assist level: Minimal Assistance - Patient > 75% Assistive device: Walker-rolling Max distance: 90'   Walk 10 feet activity   Assist  Walk 10 feet activity did not occur: Safety/medical concerns  Assist level: Minimal Assistance - Patient > 75% Assistive device: Walker-rolling   Walk 50 feet activity   Assist Walk 50 feet with 2 turns activity did not occur: Safety/medical concerns  Assist level: Minimal Assistance - Patient > 75% Assistive device: Walker-rolling    Walk 150 feet activity   Assist Walk 150 feet activity did not occur: Safety/medical concerns         Walk 10 feet on uneven surface  activity   Assist Walk 10 feet on uneven surfaces activity did not occur: Safety/medical concerns         Wheelchair     Assist Will patient use wheelchair at discharge?: No             Wheelchair 50 feet with 2 turns activity    Assist            Wheelchair 150 feet activity     Assist      Assist Level: Independent   BP (!) 112/59 (BP Location: Left Arm)   Pulse (!) 49   Temp 98.2 F (36.8 C) (Oral)   Resp 16   Ht 5\' 7"  (1.702 m)   Wt 83.2 kg   SpO2 100%   BMI 28.73 kg/m   Medical Problem List and Plan: 1.  Impaired mobility and ADLs secondary to acute CVA with left midbrain infarct  Continue CIR PT, OT, SLP    2.  Antithrombotics: -DVT/anticoagulation:  Pharmaceutical: Lovenox (hold)  -check dopplers             -antiplatelet therapy: Aspirin 3. Pain Management: Tylenol prn.   Gabapentin 100 3 times daily started 11/26 for?  Neuropathic pain post stroke, decreased to nightly on 11/28 due to? Side effects, with improvement  Right wrist pain-x-ray shows OA, no acute changes, uric acid ordered and is normal   Empiric prednisone started for gout  12/8 gout pain flared with reduction to 5mg , continue at 10mg  for now   -pain improved at 10mg  dose   -UA level normal 4. Mood: Continue Celexa 20mg   daily for depression 5. Neuropsych: This patient is not fully capable of making decisions on his own behalf. 6. Skin/Wound Care: ZioPatch in place, dry skin- added Eucerin cream 7. Fluids/Electrolytes/Nutrition: Monitor I/Os. Encourage fluid intake  8. HTN: Monitor BP   Decrease Amlodipine to 5mg , DC'd on 11/28.   Continue Lisinopril 40mg  daily, decreased to 10 on 11/28  Generally has been controlled. No changes today 12/8    9. Tobacco abuse: Provide counseling  10. Tachycardia/beats of NSVT: Zio patch in place.   HR in 40-50's, asymptomatic---observe for now 11. AKI  Creatinine 1.54 on 11/28, down to 1.19 on 11/29  IVF initiated on 11/27, [facility]  Encourage fluids, will bolus 251ml on 12/6  -recheck labs Monday 12. ABLA  Hb 11.2 on 11/27, 9.6 on 11/29:     further drop to 9.2 on 12/2. stool OB+ today 12/5  -hgb up to 9.5 on 12/6, stool OB+ x 2  -12/8 further dip in hgb today to 8.9   -hold lovenox    -check venous dopplers 13.  Leukocytosis steroids plus UTI   WBCs 18.5 12/6--18.8 12/8  UA equivocal, UC returned positive for enterococcus faecalis sensitive to nitrofurantoin- ordered.   Recent CXR normal but crackles on right  Remains Afebrile  re-check cxr today 12/8  -Incentive spirometry 14.  Hyponatremia  Sodium 134 12/6 15.  Post stroke dysphagia  D2 thins  Advance diet as tolerated per SLP    LOS: 14 days A FACE TO FACE EVALUATION WAS PERFORMED  Andres White 07/11/2020, 8:52 AM

## 2020-07-11 NOTE — Progress Notes (Signed)
Physical Therapy Session Note  Patient Details  Name: Andres White MRN: [id number] Date of Birth: 15-Apr-1946  Today's Date: 07/11/2020 PT Individual Time: 0905-0959 PT Individual Time Calculation (min): 54 min   Short Term Goals: Week 2:  PT Short Term Goal 1 (Week 2): Pt will perform bed mobility with CGA PT Short Term Goal 2 (Week 2): Pt will perform bed to chair transfer consistently with CGA. PT Short Term Goal 3 (Week 2): Pt will ambulate 150' with CGA and LRAD.  Skilled Therapeutic Interventions/Progress Updates:     Pt received supine in bed and agrees to therapy. No complaint of pain when initially asked but when questioned further, admits to pain in both feet, R>L. Supine to sit with supervision and cues on positioning at EOB for safety. Stand pivot to WC with minA. WC transport to gym for time management. Pt transfers to Nustep and completes total of 10:00 with multiple rest breaks, at workload of 6 with steps per minute >40. Activity completed for strength and endurance training as well as reciprocal coordination. Longest consecutive bout on Nustep ~3:00. Pt then performs ambulation, x70', and x50', with extended seated rest break. Pt ambulates with forward flexed posture, antalgic gait pattern, and during first bout has episode of R leg/knee buckling. Pt ambulates with minA. WC transport back to room. Pt left seated in WC with alarm intact and all needs within reach.  Therapy Documentation Precautions:  Precautions Precautions: Fall Precaution Comments: watch HR Restrictions Weight Bearing Restrictions: No    Therapy/Group: Individual Therapy  Breck Coons, PT, DPT 07/11/2020, 4:01 PM

## 2020-07-11 NOTE — Progress Notes (Signed)
Speech Language Pathology Daily Session Note  Patient Details  Name: Andres White MRN: [id number] Date of Birth: [birth year]  Today's Date: 07/11/2020 SLP Individual Time: 1100-1155 SLP Individual Time Calculation (min): 55 min  Short Term Goals: Week 2: SLP Short Term Goal 1 (Week 2): Pt will self-monitor and correct errors during functional tasks wth Min verbal cues. SLP Short Term Goal 2 (Week 2): Pt will recall new and/or daily information with Min A for use of compensatory strategies or aids. SLP Short Term Goal 3 (Week 2): Pt will recall 2 safety precuations with Min A cues for use of aids or strategies. SLP Short Term Goal 4 (Week 2): Pt will demonstrate ability to problem solving basic to mildly complex functional tasks with Min A verbal/visual cues. SLP Short Term Goal 5 (Week 2): Patient will consume current diet with minimal overt s/s of aspiration with Mod I for use of swallowing compensatory strategies. SLP Short Term Goal 6 (Week 2): Patient will demonstrate efficient mastication and complete oral clearance without overt s/s of aspiration with trials of Dys. 3 textures over 2 sessions prior to upgrade.  Skilled Therapeutic Interventions: Skilled treatment session focused on dysphagia and cognitive goals. SLP facilitated session by providing skilled observation with trial tray of Dys. 3 textures. Patient demonstrated efficient mastication with complete oral clearance without overt s/s of aspiration. Therefore, recommend patient upgrade to Dys. 3 textures and continue thin liquids. Patient continues to demonstrate decreased ability to decode at the word level with intermittent phonemic paraphasias. Patient also demonstrates intermittent language of confusion but difficult to determine language deficits vs cognitive deficits vs both. Patient answered complex yes.no questions with 60% accuracy and followed 3-step commands with 100% accuracy. Patient also performed responsive naming tasks  with 100% accuracy but was unable to complete divergent or convergent naming tasks. For example, reported that the three different colors all smelled the same and reported that the 3 weather elements were detergents. However, patient is able to participate in verbal conversations with minimal difficulty. Ongoing diagnostic treatment needed. Patient transferred back to bed at end of session. Patient left upright in bed with alarm on and all needs within reach. Continue with current plan of care.      Pain Pain Assessment Pain Scale: 0-10 Pain Score: 0-No pain  Therapy/Group: Individual Therapy  Sibley Rolison 07/11/2020, 12:25 PM

## 2020-07-11 NOTE — Progress Notes (Signed)
Occupational Therapy Session Note  Patient Details  Name: Andres White MRN: [id number] Date of Birth: 1945-10-26  Today's Date: 07/11/2020 OT Individual Time: 8144-3926 OT Individual Time Calculation (min): 57 min    Short Term Goals: Week 2:  OT Short Term Goal 1 (Week 2): Pt will complete toilet transfer with min A OT Short Term Goal 2 (Week 2): Pt will complete 1/3 toileting steps OT Short Term Goal 3 (Week 2): Pt will maintain standing balance with min A within BADL task  Skilled Therapeutic Interventions/Progress Updates:    Pt received supine with no c/o pain. Pt initially declining participation stating "it is too late for a bath, why didn't you come earlier". Discussed schedule and indication for OT and pt was agreeable to EOB participation only. Pt came EOB with CGA, use of bed rail from flat bed. Pt sat EOB and compelted UB bathing with set up assist. Sit > stand from EOB with mod A, heavy support needed to stabilize RW. Pt completed peri hygiene with min A. Anterior hygiene with set up assist while seated. Pt stood once more with mod A after rest break, with mod A. Brief donned with mod A, pt declining to wear pants. Pt reporting pain 5/10 in his R foot, described as aching. Foot quite swollen but pt declining teds. Larger socks donned to prevent skin indentation. Pt returned to supine with min A. From supine pt completed BUE strengthening circuit with closed chain to promote bimanual integration, holding a 3 lb dowel, 2x15 bicep curls, shoulder flexion and chest press. Pt then completed [facility] activity with deck of playing cards, focused on coordination with lateral pinch and pincer grasp. Pt was left supine with all needs met, bed alarm set.   Therapy Documentation Precautions:  Precautions Precautions: Fall Precaution Comments: watch HR Restrictions Weight Bearing Restrictions: No  Therapy/Group: Individual Therapy  Curtis Sites 07/11/2020, 3:00 PM

## 2020-07-12 ENCOUNTER — Inpatient Hospital Stay (HOSPITAL_COMMUNITY): Payer: Medicare Other

## 2020-07-12 ENCOUNTER — Inpatient Hospital Stay (HOSPITAL_COMMUNITY): Payer: Medicare Other | Admitting: Occupational Therapy

## 2020-07-12 DIAGNOSIS — R000 Tachycardia, unspecified: Secondary | ICD-10-CM | POA: Diagnosis not present

## 2020-07-12 DIAGNOSIS — I6389 Other cerebral infarction: Secondary | ICD-10-CM | POA: Diagnosis not present

## 2020-07-12 DIAGNOSIS — D62 Acute posthemorrhagic anemia: Secondary | ICD-10-CM | POA: Diagnosis not present

## 2020-07-12 DIAGNOSIS — D649 Anemia, unspecified: Secondary | ICD-10-CM

## 2020-07-12 DIAGNOSIS — R195 Other fecal abnormalities: Secondary | ICD-10-CM

## 2020-07-12 DIAGNOSIS — I69391 Dysphagia following cerebral infarction: Secondary | ICD-10-CM | POA: Diagnosis not present

## 2020-07-12 LAB — URINALYSIS, COMPLETE (UACMP) WITH MICROSCOPIC
Bacteria, UA: NONE SEEN
Bilirubin Urine: NEGATIVE
Glucose, UA: NEGATIVE mg/dL
Hgb urine dipstick: NEGATIVE
Ketones, ur: NEGATIVE mg/dL
Leukocytes,Ua: NEGATIVE
Nitrite: NEGATIVE
Protein, ur: NEGATIVE mg/dL
Specific Gravity, Urine: 1.015 (ref 1.005–1.030)
pH: 5 (ref 5.0–8.0)

## 2020-07-12 LAB — CBC
HCT: 26.8 % — ABNORMAL LOW (ref 39.0–52.0)
Hemoglobin: 8.6 g/dL — ABNORMAL LOW (ref 13.0–17.0)
MCH: 30.3 pg (ref 26.0–34.0)
MCHC: 32.1 g/dL (ref 30.0–36.0)
MCV: 94.4 fL (ref 80.0–100.0)
Platelets: 330 10*3/uL (ref 150–400)
RBC: 2.84 MIL/uL — ABNORMAL LOW (ref 4.22–5.81)
RDW: 13.2 % (ref 11.5–15.5)
WBC: 18.5 10*3/uL — ABNORMAL HIGH (ref 4.0–10.5)
nRBC: 0 % (ref 0.0–0.2)

## 2020-07-12 MED ORDER — PANTOPRAZOLE SODIUM 40 MG PO TBEC
40.0000 mg | DELAYED_RELEASE_TABLET | Freq: Two times a day (BID) | ORAL | Status: DC
  Administered 2020-07-12 – 2020-07-18 (×12): 40 mg via ORAL
  Filled 2020-07-12 (×9): qty 1
  Filled 2020-07-12 (×2): qty 2
  Filled 2020-07-12: qty 1

## 2020-07-12 MED ORDER — METOCLOPRAMIDE HCL 5 MG/ML IJ SOLN
10.0000 mg | Freq: Once | INTRAMUSCULAR | Status: AC
  Administered 2020-07-12: 10 mg via INTRAVENOUS
  Filled 2020-07-12: qty 2

## 2020-07-12 MED ORDER — PEG-KCL-NACL-NASULF-NA ASC-C 100 G PO SOLR
0.5000 | Freq: Once | ORAL | Status: AC
  Administered 2020-07-12: 100 g via ORAL
  Filled 2020-07-12 (×2): qty 1

## 2020-07-12 MED ORDER — PEG-KCL-NACL-NASULF-NA ASC-C 100 G PO SOLR: 1.0000 | Freq: Once | ORAL | Status: DC

## 2020-07-12 MED ORDER — METOCLOPRAMIDE HCL 5 MG/ML IJ SOLN
10.0000 mg | Freq: Once | INTRAMUSCULAR | Status: AC
  Administered 2020-07-13: 10 mg via INTRAVENOUS
  Filled 2020-07-12: qty 2

## 2020-07-12 MED ORDER — PREDNISONE 5 MG PO TABS
7.5000 mg | ORAL_TABLET | Freq: Every day | ORAL | Status: DC
  Administered 2020-07-13 – 2020-07-15 (×3): 7.5 mg via ORAL
  Filled 2020-07-12 (×4): qty 2

## 2020-07-12 MED ORDER — BISACODYL 5 MG PO TBEC
20.0000 mg | DELAYED_RELEASE_TABLET | Freq: Once | ORAL | Status: AC
  Administered 2020-07-12: 20 mg via ORAL
  Filled 2020-07-12: qty 4

## 2020-07-12 MED ORDER — PEG-KCL-NACL-NASULF-NA ASC-C 100 G PO SOLR
0.5000 | Freq: Once | ORAL | Status: AC
  Administered 2020-07-12: 100 g via ORAL
  Filled 2020-07-12: qty 1

## 2020-07-12 NOTE — Consult Note (Addendum)
[facility] Consult: 1:03 PM 07/12/2020  LOS: 15 days    Referring Provider: Dr Naaman Plummer Primary Care Physician:  [name], MD in [place]. Primary Gastroenterologist:  [facility]    Reason for Consultation:  Anemia, FOBT +   HPI: Andres White is a 74 y.o. male.  PMH Carotid stenosis.  Spinal stenosis, osteoarthritis.  Hypertension. 01/2014 colonoscopy Two 2 to 4 mm polyps in the transverse colon. Resected, retrieved.  One 2 mm polyp in the ascending colon. Resected, retrieved.  2 confluent ulcers in the ascending colon of uncertain significance. Biopsied.  Internal hemorrhoids.  Examined portion of the ileum was normal. I am unable to locate path report.    Was due repeat colonoscopy 01/2019 but has not undergone repeat surveillance study.  No previous EGD  Suffered acute CVA.  Right leg weakness for 4 days, difficulty walking, mild dysarthria.  Admitted to [facility] 06/24/20.  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  Carotid ultrasound resealed 70 to 99% left ICA stenosis.  Brief episodes of asymptomatic NSVT during admission Discharge to [facility] inpatient rehab 11/24.  Neurology recommended aspirin and statin for secondary stroke prevention.  Not on Plavix. At rehab has been receiving DVT prophylaxis injections of Lovenox these were discontinued due to anemia and last dose was on 12/7.   Patient states his stools are brown, formed and occur about every day.  No abdominal pain.  Appetite is good, no dysphagia.  No nausea or vomiting.  No heartburn symptoms.  No use of NSAIDs.  Does not use and is not prescribed PPI or H2 blocker. In terms of progress following stroke patient requiring minimal to moderate assistance for sitting to standing and pivot  transfers. Hgb 14.2 at admission 11/21 >> 9.6 on 11/29 >> 9.2 on 12/2 >> drifting to 8.6 today.  MCV 91.   FOBT + but no overt blood loss above or below.   Concomitant elevation of WBCs to 18.8.  Minor renal insufficiency.   BUN 31 two d ago, down from 36 eleven d ago.    Family history pertinent for cirrhosis in his daughter.   Social history.  Long-term smoker.  Charting mentions typically drinking 2-3 beers daily.   Lives in [place] with his long-term girlfriend.  Past Medical History:  Diagnosis Date  . Arthritis   . Chronic back pain   . Gout   . Hypertension   . Syncope and collapse     Past Surgical History:  Procedure Laterality Date  . CARDIAC CATHETERIZATION  2015   [facility]   . KNEE SURGERY      Prior to Admission medications   Medication Sig Start Date End Date Taking? Authorizing Provider  acetaminophen (TYLENOL) 325 MG tablet Take 1-2 tablets (325-650 mg total) by mouth every 4 (four) hours as needed for mild pain. 07/05/20   Love, [name], PA-C  amLODipine (NORVASC) 10 MG tablet Take 10 mg by mouth daily.    [provider]  aspirin EC 81 MG tablet Take  1 tablet (81 mg total) by mouth daily. 04/13/14   [name], MD  atorvastatin (LIPITOR) 40 MG tablet Take 1 tablet (40 mg total) by mouth daily. 06/27/20   Pokhrel, [name], MD  citalopram (CELEXA) 20 MG tablet Take 20 mg by mouth daily.    [provider]  colchicine 0.6 MG tablet Take 0.6 mg by mouth 2 (two) times daily as needed.    [provider]  lisinopril (PRINIVIL,ZESTRIL) 40 MG tablet Take 40 mg by mouth daily.    [provider]  senna-docusate (SENOKOT-S) 8.6-50 MG tablet Take 1 tablet by mouth at bedtime as needed for moderate constipation. 06/27/20   Pokhrel, [name], MD  tamsulosin (FLOMAX) 0.4 MG CAPS capsule Take 0.4 mg by mouth daily. 04/07/20   [provider]    Scheduled Meds: . aspirin EC  81 mg Oral Daily  . atorvastatin  40 mg Oral Daily  .  citalopram  20 mg Oral Daily  . diclofenac Sodium  2 g Topical QID  . feeding supplement  237 mL Oral BID BM  . gabapentin  100 mg Oral BID  . hydrocerin   Topical BID  . lisinopril  10 mg Oral Daily  . multivitamin with minerals  1 tablet Oral Daily  . nitrofurantoin (macrocrystal-monohydrate)  100 mg Oral Q12H  . pantoprazole  40 mg Oral BID  . [START ON 07/13/2020] predniSONE  7.5 mg Oral Q breakfast  . tamsulosin  0.4 mg Oral Daily   Infusions:  PRN Meds: acetaminophen, alum & mag hydroxide-simeth, bisacodyl, diphenhydrAMINE, guaiFENesin-dextromethorphan, polyethylene glycol, prochlorperazine **OR** prochlorperazine **OR** prochlorperazine, sodium phosphate, traZODone   Allergies as of 06/27/2020  . (No Known Allergies)    Family History  Problem Relation Age of Onset  . Hypertension Mother     Social History   Socioeconomic History  . Marital status: Single    Spouse name: Not on file  . Number of children: Not on file  . Years of education: Not on file  . Highest education level: Not on file  Occupational History  . Not on file  Tobacco Use  . Smoking status: Current Every Day Smoker    Years: 20.00    Types: Cigarettes  . Smokeless tobacco: Never Used  Vaping Use  . Vaping Use: Never used  Substance and Sexual Activity  . Alcohol use: Yes    Comment: occasional  . Drug use: No  . Sexual activity: Not on file  Other Topics Concern  . Not on file  Social History Narrative  . Not on file   Social Determinants of Health   Financial Resource Strain: Not on file  Food Insecurity: Not on file  Transportation Needs: Not on file  Physical Activity: Not on file  Stress: Not on file  Social Connections: Not on file  Intimate Partner Violence: Not on file    REVIEW OF SYSTEMS: Constitutional: Denies weakness. ENT:  No nose bleeds Pulm: Denies shortness of breath or cough. CV:  No palpitations.  No angina.  New bilateral pedal swelling which is actually  worse on the nonstroke affected right side than the left side. GU: Positive urinary incontinence since the stroke.  This may be more a matter of him not being able to get up to urinate after the stroke.  No hematuria, no frequency GI: See HPI.    Heme: Denies unusual or excessive bleeding. Transfusions: None Neuro: Stroke with residual left-sided weakness no headaches, no peripheral tingling or numbness Derm:  No itching, no rash or sores.  Endocrine:  No sweats or chills.  No polyuria or dysuria Immunization: Has been vaccinated for COVID-19. Travel:  None beyond local counties in last few months.    PHYSICAL EXAM: Vital signs in last 24 hours: Vitals:   07/12/20 0315 07/12/20 0804  BP: (!) 105/58 110/71  Pulse: 63 77  Resp: 17   Temp: 98.9 F (37.2 C)   SpO2: 100%    Wt Readings from Last 3 Encounters:  07/11/20 85 kg  06/24/20 74.4 kg  03/30/18 88.5 kg    General: Pleasant, comfortable, does not look acutely ill. Head: No facial asymmetry or swelling.  No signs of head trauma. Eyes: No scleral icterus or conjunctival pallor. Ears: Not hard of hearing Nose: No congestion or discharge Mouth: Oral mucosa moist, pink, clear.  Edentulous. Neck: No JVD, masses, thyromegaly. Lungs: Clear bilaterally.  No labored breathing or cough. Heart: RRR.  No MRG.  S1, S2 present.  "Zio" monitor in position in upper left chest (HR monitor) Abdomen: Soft, nontender, nondistended.  No HSM, masses, bruits, hernias active bowel sounds..  Rectal: Deferred GU: Patient's slacks and depends are wet.  There is the smell of urine consistent with urinary incontinence. Musc/Skeltl: No joint deformities, contractures. Extremities: Pitting edema of the lower legs into the feet bilaterally.  Worse on the right.  Not tender. Neurologic: Alert.  Appropriate.  Oriented x3.  Some weakness in the right foot.  Profound weakness on the left arm and foot which he is able to move against gravity. Skin: No rash,  no sores, no suspicious lesions Nodes: No cervical adenopathy Psych: Cooperative, calm, pleasant, neutral affect.  Intake/Output from previous day: 12/08 0701 - 12/09 0700 In: 1020 [P.O.:1020] Out: 1600 [Urine:1600] Intake/Output this shift: Total I/O In: 238 [P.O.:238] Out: -   LAB RESULTS: Recent Labs    07/11/20 0509 07/12/20 0303  WBC 18.8* 18.5*  HGB 8.9* 8.6*  HCT 26.8* 26.8*  PLT 332 330   BMET Lab Results  Component Value Date   NA 134 (L) 07/10/2020   NA 134 (L) 07/09/2020   NA 132 (L) 07/02/2020   K 4.6 07/10/2020   K 5.1 07/09/2020   K 4.8 07/02/2020   CL 103 07/10/2020   CL 104 07/09/2020   CL 104 07/02/2020   CO2 21 (L) 07/10/2020   CO2 22 07/09/2020   CO2 18 (L) 07/02/2020   GLUCOSE 110 (H) 07/10/2020   GLUCOSE 97 07/09/2020   GLUCOSE 138 (H) 07/02/2020   BUN 31 (H) 07/10/2020   BUN 26 (H) 07/09/2020   BUN 25 (H) 07/02/2020   CREATININE 1.27 (H) 07/10/2020   CREATININE 1.16 07/09/2020   CREATININE 1.16 07/02/2020   CALCIUM 9.2 07/10/2020   CALCIUM 9.1 07/09/2020   CALCIUM 8.8 (L) 07/02/2020   LFT No results for input(s): PROT, ALBUMIN, AST, ALT, ALKPHOS, BILITOT, BILIDIR, IBILI in the last 72 hours. PT/INR Lab Results  Component Value Date   INR 1.1 06/24/2020   Hepatitis Panel No results for input(s): HEPBSAG, HCVAB, HEPAIGM, HEPBIGM in the last 72 hours. C-Diff No components found for: CDIFF Lipase     Component Value Date/Time   LIPASE 124 01/06/2014 1157    Drugs of Abuse     Component Value Date/Time   LABOPIA NONE DETECTED 06/24/2020 0732   COCAINSCRNUR NONE DETECTED 06/24/2020 0732   LABBENZ NONE DETECTED 06/24/2020 0732   AMPHETMU NONE DETECTED 06/24/2020 0732   THCU NONE DETECTED 06/24/2020 0732  LABBARB NONE DETECTED 06/24/2020 0732     RADIOLOGY STUDIES: DG Chest 2 View  Result Date: 07/11/2020 CLINICAL DATA:  Cough. EXAM: CHEST - 2 VIEW COMPARISON:  July 01, 2020. FINDINGS: The heart size and mediastinal  contours are within normal limits. Both lungs are clear. The visualized skeletal structures are unremarkable. IMPRESSION: No active cardiopulmonary disease. Electronically Signed   By: Marijo Conception M.D.   On: 07/11/2020 12:45   VAS [place] LOWER EXTREMITY VENOUS (DVT)  Result Date: 07/11/2020 Summary: RIGHT: - There is no evidence of deep vein thrombosis in the lower extremity.  - No cystic structure found in the popliteal fossa.  LEFT: - There is no evidence of deep vein thrombosis in the lower extremity.  - No cystic structure found in the popliteal fossa.    IMPRESSION:   *   FOBT + anemia DVT prophy Lovenox d/c'd, last given 12/7 No overt bleeding.   01/2014 colonoscopy with multiple polyps removed, nonspecific ulcers at ascending colon.  Do not have access to pathology from this procedure.  Was to repeat colonoscopy in 01/2019.   No previous EGD.  *    Acute on chronic stroke.  Secondary stroke prevention with low-dose ASA and Lipitor.   PLAN:     *     Pursue EGD alone versus EGD/colonoscopy. Prep for colonoscopy will be challenging due to his needing assistance anytime he gets up out of bed. Both procedures discussed with the patient and he is game to pursue either plan.  Has eaten solid food up until lunchtime today.   Azucena Freed  07/12/2020, 1:03 PM Phone [phone]    Attending Physician Note   I have taken a history, examined the patient and reviewed the chart. I agree with the Advanced Practitioner's note, impression and recommendations.  Occult blood in stool and progressively worsening anemia (Hgb14.2 to 8.6). History of 3 small colon polyps, nonspecific ascending colon ulcers and internal hemorrhoids at colonoscopy in June 2015 however we do not have pathology available. No overt bleeding noted. He feels he will be able to complete a bowel prep for colonoscopy with appropriate assistance. Hold Lovenox. Schedule colonoscopy and EGD for tomorrow. Change to clear liquid  diet.   Recent stroke, making a good recovery.   Lucio Edward, MD [facility]

## 2020-07-12 NOTE — H&P (View-Only) (Signed)
[facility] Consult: 1:03 PM 07/12/2020  LOS: 15 days    Referring Provider: Dr Naaman Plummer Primary Care Physician:  [name], MD in [place]. Primary Gastroenterologist:  [facility]    Reason for Consultation:  Anemia, FOBT +   HPI: Andres White is a 74 y.o. male.  PMH Carotid stenosis.  Spinal stenosis, osteoarthritis.  Hypertension. 01/2014 colonoscopy Two 2 to 4 mm polyps in the transverse colon. Resected, retrieved.  One 2 mm polyp in the ascending colon. Resected, retrieved.  2 confluent ulcers in the ascending colon of uncertain significance. Biopsied.  Internal hemorrhoids.  Examined portion of the ileum was normal. I am unable to locate path report.    Was due repeat colonoscopy 01/2019 but has not undergone repeat surveillance study.  No previous EGD  Suffered acute CVA.  Right leg weakness for 4 days, difficulty walking, mild dysarthria.  Admitted to [facility] 06/24/20.  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  Carotid ultrasound resealed 70 to 99% left ICA stenosis.  Brief episodes of asymptomatic NSVT during admission Discharge to [facility] inpatient rehab 11/24.  Neurology recommended aspirin and statin for secondary stroke prevention.  Not on Plavix. At rehab has been receiving DVT prophylaxis injections of Lovenox these were discontinued due to anemia and last dose was on 12/7.   Patient states his stools are brown, formed and occur about every day.  No abdominal pain.  Appetite is good, no dysphagia.  No nausea or vomiting.  No heartburn symptoms.  No use of NSAIDs.  Does not use and is not prescribed PPI or H2 blocker. In terms of progress following stroke patient requiring minimal to moderate assistance for sitting to standing and pivot  transfers. Hgb 14.2 at admission 11/21 >> 9.6 on 11/29 >> 9.2 on 12/2 >> drifting to 8.6 today.  MCV 91.   FOBT + but no overt blood loss above or below.   Concomitant elevation of WBCs to 18.8.  Minor renal insufficiency.   BUN 31 two d ago, down from 36 eleven d ago.    Family history pertinent for cirrhosis in his daughter.   Social history.  Long-term smoker.  Charting mentions typically drinking 2-3 beers daily.   Lives in [name] with his long-term girlfriend.  Past Medical History:  Diagnosis Date  . Arthritis   . Chronic back pain   . Gout   . Hypertension   . Syncope and collapse     Past Surgical History:  Procedure Laterality Date  . CARDIAC CATHETERIZATION  2015   [facility]   . KNEE SURGERY      Prior to Admission medications   Medication Sig Start Date End Date Taking? Authorizing Provider  acetaminophen (TYLENOL) 325 MG tablet Take 1-2 tablets (325-650 mg total) by mouth every 4 (four) hours as needed for mild pain. 07/05/20   Love, [name], PA-C  amLODipine (NORVASC) 10 MG tablet Take 10 mg by mouth daily.    [provider]  aspirin EC 81 MG tablet Take  1 tablet (81 mg total) by mouth daily. 04/13/14   [name], MD  atorvastatin (LIPITOR) 40 MG tablet Take 1 tablet (40 mg total) by mouth daily. 06/27/20   Pokhrel, [name], MD  citalopram (CELEXA) 20 MG tablet Take 20 mg by mouth daily.    [provider]  colchicine 0.6 MG tablet Take 0.6 mg by mouth 2 (two) times daily as needed.    [provider]  lisinopril (PRINIVIL,ZESTRIL) 40 MG tablet Take 40 mg by mouth daily.    [provider]  senna-docusate (SENOKOT-S) 8.6-50 MG tablet Take 1 tablet by mouth at bedtime as needed for moderate constipation. 06/27/20   Pokhrel, [name], MD  tamsulosin (FLOMAX) 0.4 MG CAPS capsule Take 0.4 mg by mouth daily. 04/07/20   [provider]    Scheduled Meds: . aspirin EC  81 mg Oral Daily  . atorvastatin  40 mg Oral Daily  .  citalopram  20 mg Oral Daily  . diclofenac Sodium  2 g Topical QID  . feeding supplement  237 mL Oral BID BM  . gabapentin  100 mg Oral BID  . hydrocerin   Topical BID  . lisinopril  10 mg Oral Daily  . multivitamin with minerals  1 tablet Oral Daily  . nitrofurantoin (macrocrystal-monohydrate)  100 mg Oral Q12H  . pantoprazole  40 mg Oral BID  . [START ON 07/13/2020] predniSONE  7.5 mg Oral Q breakfast  . tamsulosin  0.4 mg Oral Daily   Infusions:  PRN Meds: acetaminophen, alum & mag hydroxide-simeth, bisacodyl, diphenhydrAMINE, guaiFENesin-dextromethorphan, polyethylene glycol, prochlorperazine **OR** prochlorperazine **OR** prochlorperazine, sodium phosphate, traZODone   Allergies as of 06/27/2020  . (No Known Allergies)    Family History  Problem Relation Age of Onset  . Hypertension Mother     Social History   Socioeconomic History  . Marital status: Single    Spouse name: Not on file  . Number of children: Not on file  . Years of education: Not on file  . Highest education level: Not on file  Occupational History  . Not on file  Tobacco Use  . Smoking status: Current Every Day Smoker    Years: 20.00    Types: Cigarettes  . Smokeless tobacco: Never Used  Vaping Use  . Vaping Use: Never used  Substance and Sexual Activity  . Alcohol use: Yes    Comment: occasional  . Drug use: No  . Sexual activity: Not on file  Other Topics Concern  . Not on file  Social History Narrative  . Not on file   Social Determinants of Health   Financial Resource Strain: Not on file  Food Insecurity: Not on file  Transportation Needs: Not on file  Physical Activity: Not on file  Stress: Not on file  Social Connections: Not on file  Intimate Partner Violence: Not on file    REVIEW OF SYSTEMS: Constitutional: Denies weakness. ENT:  No nose bleeds Pulm: Denies shortness of breath or cough. CV:  No palpitations.  No angina.  New bilateral pedal swelling which is actually  worse on the nonstroke affected right side than the left side. GU: Positive urinary incontinence since the stroke.  This may be more a matter of him not being able to get up to urinate after the stroke.  No hematuria, no frequency GI: See HPI.    Heme: Denies unusual or excessive bleeding. Transfusions: None Neuro: Stroke with residual left-sided weakness no headaches, no peripheral tingling or numbness Derm:  No itching, no rash or sores.  Endocrine:  No sweats or chills.  No polyuria or dysuria Immunization: Has been vaccinated for COVID-19. Travel:  None beyond local counties in last few months.    PHYSICAL EXAM: Vital signs in last 24 hours: Vitals:   07/12/20 0315 07/12/20 0804  BP: (!) 105/58 110/71  Pulse: 63 77  Resp: 17   Temp: 98.9 F (37.2 C)   SpO2: 100%    Wt Readings from Last 3 Encounters:  07/11/20 85 kg  06/24/20 74.4 kg  03/30/18 88.5 kg    General: Pleasant, comfortable, does not look acutely ill. Head: No facial asymmetry or swelling.  No signs of head trauma. Eyes: No scleral icterus or conjunctival pallor. Ears: Not hard of hearing Nose: No congestion or discharge Mouth: Oral mucosa moist, pink, clear.  Edentulous. Neck: No JVD, masses, thyromegaly. Lungs: Clear bilaterally.  No labored breathing or cough. Heart: RRR.  No MRG.  S1, S2 present.  "Zio" monitor in position in upper left chest (HR monitor) Abdomen: Soft, nontender, nondistended.  No HSM, masses, bruits, hernias active bowel sounds..  Rectal: Deferred GU: Patient's slacks and depends are wet.  There is the smell of urine consistent with urinary incontinence. Musc/Skeltl: No joint deformities, contractures. Extremities: Pitting edema of the lower legs into the feet bilaterally.  Worse on the right.  Not tender. Neurologic: Alert.  Appropriate.  Oriented x3.  Some weakness in the right foot.  Profound weakness on the left arm and foot which he is able to move against gravity. Skin: No rash,  no sores, no suspicious lesions Nodes: No cervical adenopathy Psych: Cooperative, calm, pleasant, neutral affect.  Intake/Output from previous day: 12/08 0701 - 12/09 0700 In: 1020 [P.O.:1020] Out: 1600 [Urine:1600] Intake/Output this shift: Total I/O In: 238 [P.O.:238] Out: -   LAB RESULTS: Recent Labs    07/11/20 0509 07/12/20 0303  WBC 18.8* 18.5*  HGB 8.9* 8.6*  HCT 26.8* 26.8*  PLT 332 330   BMET Lab Results  Component Value Date   NA 134 (L) 07/10/2020   NA 134 (L) 07/09/2020   NA 132 (L) 07/02/2020   K 4.6 07/10/2020   K 5.1 07/09/2020   K 4.8 07/02/2020   CL 103 07/10/2020   CL 104 07/09/2020   CL 104 07/02/2020   CO2 21 (L) 07/10/2020   CO2 22 07/09/2020   CO2 18 (L) 07/02/2020   GLUCOSE 110 (H) 07/10/2020   GLUCOSE 97 07/09/2020   GLUCOSE 138 (H) 07/02/2020   BUN 31 (H) 07/10/2020   BUN 26 (H) 07/09/2020   BUN 25 (H) 07/02/2020   CREATININE 1.27 (H) 07/10/2020   CREATININE 1.16 07/09/2020   CREATININE 1.16 07/02/2020   CALCIUM 9.2 07/10/2020   CALCIUM 9.1 07/09/2020   CALCIUM 8.8 (L) 07/02/2020   LFT No results for input(s): PROT, ALBUMIN, AST, ALT, ALKPHOS, BILITOT, BILIDIR, IBILI in the last 72 hours. PT/INR Lab Results  Component Value Date   INR 1.1 06/24/2020   Hepatitis Panel No results for input(s): HEPBSAG, HCVAB, HEPAIGM, HEPBIGM in the last 72 hours. C-Diff No components found for: CDIFF Lipase     Component Value Date/Time   LIPASE 124 01/06/2014 1157    Drugs of Abuse     Component Value Date/Time   LABOPIA NONE DETECTED 06/24/2020 0732   COCAINSCRNUR NONE DETECTED 06/24/2020 0732   LABBENZ NONE DETECTED 06/24/2020 0732   AMPHETMU NONE DETECTED 06/24/2020 0732   THCU NONE DETECTED 06/24/2020 0732  LABBARB NONE DETECTED 06/24/2020 0732     RADIOLOGY STUDIES: DG Chest 2 View  Result Date: 07/11/2020 CLINICAL DATA:  Cough. EXAM: CHEST - 2 VIEW COMPARISON:  July 01, 2020. FINDINGS: The heart size and mediastinal  contours are within normal limits. Both lungs are clear. The visualized skeletal structures are unremarkable. IMPRESSION: No active cardiopulmonary disease. Electronically Signed   By: Marijo Conception M.D.   On: 07/11/2020 12:45   VAS [place] LOWER EXTREMITY VENOUS (DVT)  Result Date: 07/11/2020 Summary: RIGHT: - There is no evidence of deep vein thrombosis in the lower extremity.  - No cystic structure found in the popliteal fossa.  LEFT: - There is no evidence of deep vein thrombosis in the lower extremity.  - No cystic structure found in the popliteal fossa.    IMPRESSION:   *   FOBT + anemia DVT prophy Lovenox d/c'd, last given 12/7 No overt bleeding.   01/2014 colonoscopy with multiple polyps removed, nonspecific ulcers at ascending colon.  Do not have access to pathology from this procedure.  Was to repeat colonoscopy in 01/2019.   No previous EGD.  *    Acute on chronic stroke.  Secondary stroke prevention with low-dose ASA and Lipitor.   PLAN:     *     Pursue EGD alone versus EGD/colonoscopy. Prep for colonoscopy will be challenging due to his needing assistance anytime he gets up out of bed. Both procedures discussed with the patient and he is game to pursue either plan.  Has eaten solid food up until lunchtime today.   Azucena Freed  07/12/2020, 1:03 PM Phone [phone]    Attending Physician Note   I have taken a history, examined the patient and reviewed the chart. I agree with the Advanced Practitioner's note, impression and recommendations.  Occult blood in stool and progressively worsening anemia (Hgb14.2 to 8.6). History of 3 small colon polyps, nonspecific ascending colon ulcers and internal hemorrhoids at colonoscopy in June 2015 however we do not have pathology available. No overt bleeding noted. He feels he will be able to complete a bowel prep for colonoscopy with appropriate assistance. Hold Lovenox. Schedule colonoscopy and EGD for tomorrow. Change to clear liquid  diet.   Recent stroke, making a good recovery.   Lucio Edward, MD [facility]

## 2020-07-12 NOTE — Plan of Care (Signed)
  Problem: RH Balance Goal: LTG Patient will maintain dynamic standing with ADLs (OT) Description: LTG:  Patient will maintain dynamic standing balance with assist during activities of daily living (OT)  Flowsheets (Taken 07/12/2020 0958) LTG: Pt will maintain dynamic standing balance during ADLs with: Contact Guard/Touching assist Note: Goal downgraded 12/9 due to lack of progress-ESD   Problem: RH Bathing Goal: LTG Patient will bathe all body parts with assist levels (OT) Description: LTG: Patient will bathe all body parts with assist levels (OT) Flowsheets (Taken 07/12/2020 0958) LTG: Pt will perform bathing with assistance level/cueing: Minimal Assistance - Patient > 75% Note: Goal downgraded 12/9 due to lack of progress-ESD   Problem: RH Dressing Goal: LTG Patient will perform lower body dressing w/assist (OT) Description: LTG: Patient will perform lower body dressing with assist, with/without cues in positioning using equipment (OT) Flowsheets (Taken 07/12/2020 0958) LTG: Pt will perform lower body dressing with assistance level of: Minimal Assistance - Patient > 75% Note: Goal downgraded 12/9 due to lack of progress-ESD   Problem: RH Toileting Goal: LTG Patient will perform toileting task (3/3 steps) with assistance level (OT) Description: LTG: Patient will perform toileting task (3/3 steps) with assistance level (OT)  Flowsheets (Taken 07/12/2020 0958) LTG: Pt will perform toileting task (3/3 steps) with assistance level: Minimal Assistance - Patient > 75% Note: Goal downgraded 12/9 due to lack of progress-ESD

## 2020-07-12 NOTE — Progress Notes (Signed)
Speech Language Pathology Daily Session Note  Patient Details  Name: Andres White MRN: [id number] Date of Birth: Nov 20, 1945  Today's Date: 07/12/2020 SLP Individual Time: 1300-1400 SLP Individual Time Calculation (min): 60 min  Short Term Goals: Week 2: SLP Short Term Goal 1 (Week 2): Pt will self-monitor and correct errors during functional tasks wth Min verbal cues. SLP Short Term Goal 2 (Week 2): Pt will recall new and/or daily information with Min A for use of compensatory strategies or aids. SLP Short Term Goal 3 (Week 2): Pt will recall 2 safety precuations with Min A cues for use of aids or strategies. SLP Short Term Goal 4 (Week 2): Pt will demonstrate ability to problem solving basic to mildly complex functional tasks with Min A verbal/visual cues. SLP Short Term Goal 5 (Week 2): Patient will consume current diet with minimal overt s/s of aspiration with Mod I for use of swallowing compensatory strategies. SLP Short Term Goal 6 (Week 2): Patient will demonstrate efficient mastication and complete oral clearance without overt s/s of aspiration with trials of Dys. 3 textures over 2 sessions prior to upgrade.  Skilled Therapeutic Interventions: Skilled treatment session focused on cognitive goals. SLP facilitating session by providing mod A verbal cues for convergent naming task to increase word finding abilities. Pt requiring mod A cues to ID broad category and min A cues to name additional appropriate item in category. Pt participating in written processing task to attempt to further identify changes in reading ability/comprehension. SLP initially presenting simple words on white board for patient to read (I.e. cat, dog, arm, etc) with patient unable to accurately read any presented words. Further assessment revealed difficulty identifying single letters (D-O-G patient spelling out B-O-T and C-A-T spelling out O-A-G). At letter level, pt able to accurately ID letter in 3/15 trials. Pt  asking "how can I be so smart and so dumb?" following task. Pt re-educated on CVA dx and stroke recovery, would benefit from ongoing education and support. Pt left in bed with alarm on and all needs within reach. Cont ST POC.   Pain Pain Assessment Pain Scale: 0-10 Pain Score: 0-No pain  Therapy/Group: Individual Therapy  Dewaine Conger 07/12/2020, 1:48 PM

## 2020-07-12 NOTE — Progress Notes (Signed)
[place] PHYSICAL MEDICINE & REHABILITATION PROGRESS NOTE  Subjective/Complaints: PT reports that Mr. Pavey HR was up to 120's with therapy. He felt tired, sl dizzy. Feeling better since getting back in bed. Asked him if he'd every seen a GI doctor before, and he hasn't. Had formed bm this morning without blood. Feet feeling ok.  ROS: Patient denies fever, rash, sore throat, blurred vision, nausea, vomiting, diarrhea, cough,  chest pain, joint or back pain, headache, or mood change.     Objective: Vital Signs: Blood pressure 110/71, pulse 77, temperature 98.9 F (37.2 C), resp. rate 17, height 5\' 7"  (1.702 m), weight 85 kg, SpO2 100 %. DG Chest 2 View  Result Date: 07/11/2020 CLINICAL DATA:  Cough. EXAM: CHEST - 2 VIEW COMPARISON:  July 01, 2020. FINDINGS: The heart size and mediastinal contours are within normal limits. Both lungs are clear. The visualized skeletal structures are unremarkable. IMPRESSION: No active cardiopulmonary disease. Electronically Signed   By: Marijo Conception M.D.   On: 07/11/2020 12:45   VAS [place] LOWER EXTREMITY VENOUS (DVT)  Result Date: 07/11/2020  Lower Venous DVT Study Indications: Swelling.  Performing Technologist: Vonzell Schlatter RVT  Examination Guidelines: A complete evaluation includes B-mode imaging, spectral Doppler, color Doppler, and power Doppler as needed of all accessible portions of each vessel. Bilateral testing is considered an integral part of a complete examination. Limited examinations for reoccurring indications may be performed as noted. The reflux portion of the exam is performed with the patient in reverse Trendelenburg.  +---------+---------------+---------+-----------+----------+--------------+ RIGHT    CompressibilityPhasicitySpontaneityPropertiesThrombus Aging +---------+---------------+---------+-----------+----------+--------------+ CFV      Full           Yes      Yes                                  +---------+---------------+---------+-----------+----------+--------------+ SFJ      Full                                                        +---------+---------------+---------+-----------+----------+--------------+ FV Prox  Full                                                        +---------+---------------+---------+-----------+----------+--------------+ FV Mid   Full                                                        +---------+---------------+---------+-----------+----------+--------------+ FV DistalFull                                                        +---------+---------------+---------+-----------+----------+--------------+ PFV      Full                                                        +---------+---------------+---------+-----------+----------+--------------+  POP      Full           Yes      Yes                                 +---------+---------------+---------+-----------+----------+--------------+ PTV      Full                                                        +---------+---------------+---------+-----------+----------+--------------+ PERO     Full                                                        +---------+---------------+---------+-----------+----------+--------------+   +---------+---------------+---------+-----------+----------+--------------+ LEFT     CompressibilityPhasicitySpontaneityPropertiesThrombus Aging +---------+---------------+---------+-----------+----------+--------------+ CFV      Full           Yes      Yes                                 +---------+---------------+---------+-----------+----------+--------------+ SFJ      Full                                                        +---------+---------------+---------+-----------+----------+--------------+ FV Prox  Full                                                         +---------+---------------+---------+-----------+----------+--------------+ FV Mid   Full                                                        +---------+---------------+---------+-----------+----------+--------------+ FV DistalFull                                                        +---------+---------------+---------+-----------+----------+--------------+ PFV      Full                                                        +---------+---------------+---------+-----------+----------+--------------+ POP      Full           Yes      Yes                                 +---------+---------------+---------+-----------+----------+--------------+  PTV      Full                                                        +---------+---------------+---------+-----------+----------+--------------+ PERO     Full                                                        +---------+---------------+---------+-----------+----------+--------------+     Summary: RIGHT: - There is no evidence of deep vein thrombosis in the lower extremity.  - No cystic structure found in the popliteal fossa.  LEFT: - There is no evidence of deep vein thrombosis in the lower extremity.  - No cystic structure found in the popliteal fossa.  *See table(s) above for measurements and observations.    Preliminary    Recent Labs    07/11/20 0509 07/12/20 0303  WBC 18.8* 18.5*  HGB 8.9* 8.6*  HCT 26.8* 26.8*  PLT 332 330   Recent Labs    07/10/20 0448  NA 134*  K 4.6  CL 103  CO2 21*  GLUCOSE 110*  BUN 31*  CREATININE 1.27*  CALCIUM 9.2    Intake/Output Summary (Last 24 hours) at 07/12/2020 1057 Last data filed at 07/12/2020 0240 Gross per 24 hour  Intake 658 ml  Output 1400 ml  Net -742 ml        Physical Exam: BP 110/71   Pulse 77   Temp 98.9 F (37.2 C)   Resp 17   Ht 5\' 7"  (1.702 m)   Wt 85 kg   SpO2 100%   BMI 29.35 kg/m   Constitutional: No distress . Vital signs  reviewed. HEENT: EOMI, oral membranes moist Neck: supple Cardiovascular: RRR without murmur. No JVD    Respiratory/Chest: CTA Bilaterally without wheezes or rales. Normal effort    GI/Abdomen: BS +, non-tender, non-distended Ext: no clubbing, cyanosis, or edema Psych: pleasant and cooperative Musculoskeletal: Right foot pain resolved, 1st MTP swelling as well as R ankle swelling is minimal, pain with R knee ROM no sig effusion, also with tenderness R olecranon bursa, small bursal fluid collection  dysarthric Motor: LUE/LLE: 5/5 proximal to distal RUE:   3+/5 proximal to distal with ataxia    RLE:  3- to3 /5 proximal to distal  Assessment/Plan: 1. Functional deficits which require 3+ hours per day of interdisciplinary therapy in a comprehensive inpatient rehab setting.  Physiatrist is providing close team supervision and 24 hour management of active medical problems listed below.  Physiatrist and rehab team continue to assess barriers to discharge/monitor patient progress toward functional and medical goals   Care Tool:  Bathing    Body parts bathed by patient: Abdomen,Chest,Front perineal area,Left upper leg,Right upper leg,Face,Right arm   Body parts bathed by helper: Right lower leg,Left lower leg,Buttocks,Left arm     Bathing assist Assist Level: Moderate Assistance - Patient 50 - 74%     Upper Body Dressing/Undressing Upper body dressing   What is the patient wearing?: Pull over shirt    Upper body assist Assist Level: Minimal Assistance - Patient > 75%    Lower Body Dressing/Undressing Lower body dressing  What is the patient wearing?: Pants,Incontinence brief     Lower body assist Assist for lower body dressing: Moderate Assistance - Patient 50 - 74%     Toileting Toileting    Toileting assist Assist for toileting: Moderate Assistance - Patient 50 - 74%     Transfers Chair/bed transfer  Transfers assist  Chair/bed transfer activity did not occur:  Safety/medical concerns  Chair/bed transfer assist level: Minimal Assistance - Patient > 75%     Locomotion Ambulation   Ambulation assist   Ambulation activity did not occur: Safety/medical concerns  Assist level: Minimal Assistance - Patient > 75% Assistive device: Walker-rolling Max distance: 70'   Walk 10 feet activity   Assist  Walk 10 feet activity did not occur: Safety/medical concerns  Assist level: Minimal Assistance - Patient > 75% Assistive device: Walker-rolling   Walk 50 feet activity   Assist Walk 50 feet with 2 turns activity did not occur: Safety/medical concerns  Assist level: Minimal Assistance - Patient > 75% Assistive device: Walker-rolling    Walk 150 feet activity   Assist Walk 150 feet activity did not occur: Safety/medical concerns         Walk 10 feet on uneven surface  activity   Assist Walk 10 feet on uneven surfaces activity did not occur: Safety/medical concerns         Wheelchair     Assist Will patient use wheelchair at discharge?: No             Wheelchair 50 feet with 2 turns activity    Assist            Wheelchair 150 feet activity     Assist      Assist Level: Independent   BP 110/71   Pulse 77   Temp 98.9 F (37.2 C)   Resp 17   Ht 5\' 7"  (1.702 m)   Wt 85 kg   SpO2 100%   BMI 29.35 kg/m   Medical Problem List and Plan: 1.  Impaired mobility and ADLs secondary to acute CVA with left midbrain infarct  Continue CIR PT, OT, SLP    2.  Antithrombotics: -DVT/anticoagulation:  Pharmaceutical: Lovenox (hold)  -check dopplers             -antiplatelet therapy: Aspirin 3. Pain Management: Tylenol prn.   Gabapentin 100 3 times daily started 11/26 for?  Neuropathic pain post stroke, decreased to nightly on 11/28 due to? Side effects, with improvement  Right wrist pain-x-ray shows OA, no acute changes, uric acid ordered and is normal   Empiric prednisone started for gout  12/8 gout  pain flared with reduction to 5mg , continue at 10mg  for now   -pain improved at 10mg  dose   -UA level normal  12/9 reduce prednisone to 7.5mg  tomorrow 4. Mood: Continue Celexa 20mg  daily for depression 5. Neuropsych: This patient is not fully capable of making decisions on his own behalf. 6. Skin/Wound Care: ZioPatch in place, dry skin- added Eucerin cream 7. Fluids/Electrolytes/Nutrition: Monitor I/Os. Encourage fluid intake  8. HTN: Monitor BP   Decrease Amlodipine to 5mg , DC'd on 11/28.   Continue Lisinopril 40mg  daily, decreased to 10 on 11/28  Generally has been controlled. No changes today 12/9    9. Tobacco abuse: Provide counseling  10. Tachycardia/beats of NSVT: Zio patch in place.   HR elevated with activity today   -resume norvasc 2.5mg  today   -encourage fluids   -rx anemia   -if elevations are persistent,  may re-consult cards 11. AKI  Creatinine 1.54 on 11/28, down to 1.19 on 11/29  IVF initiated on 11/27, DCed  Encourage fluids, will bolus 222ml on 12/6  -recheck labs Friday 12. ABLA  Hb 11.2 on 11/27, 9.6 on 11/29:     further drop to 9.2 on 12/2. stool OB+ today 12/5  -hgb up to 9.5 on 12/6, stool OB+ x 2  -12/9 further dip in hgb today to 8.6   -holding lovenox    -protonix bid   -weaning prednisone   -ask GI for recs, I don't see that he's seen GI in past 13.  Leukocytosis steroids plus UTI   WBCs 18.5 12/6--18.8 12/8  UA equivocal, UC returned positive for enterococcus faecalis sensitive to nitrofurantoin- ordered.   Recent CXR normal but crackles on right  Remains Afebrile  CXR 12/8 is clear  -Incentive spirometry  -likely steroid related  -recheck UA and urine culture 14.  Hyponatremia  Sodium 134 12/6 15.  Post stroke dysphagia  D2 thins  Advance diet as tolerated per SLP   Greater than 35 total minutes was spent in examination of patient, assessment of pertinent data,  formulation of a treatment plan, and in discussion with patient and/or  family.    LOS: 15 days A FACE TO FACE EVALUATION WAS PERFORMED  Meredith Staggers 07/12/2020, 10:57 AM

## 2020-07-12 NOTE — Progress Notes (Signed)
Occupational Therapy Weekly Progress Note  Patient Details  Name: Andres White MRN: [id number] Date of Birth: Jul 03, 1946  Beginning of progress report period: June 29, 2020 End of progress report period: July 12, 2020  Today's Date: 07/12/2020 OT Individual Time: 1000-1100 OT Individual Time Calculation (min): 60 min    Patient has met 2 of 3 short term goals.  Patient has been making steady progress towards OT goals. Pt needs min/mod A for sit<>stand and stand-pivot transfers depending on pain level as he has intermittent pain in LEs. Patient can use urinal at bed level and is working on toileting tasks in the bathroom as well. Min/mod A for LB ADLs and set-up A for UB. Continue current POC.  Patient continues to demonstrate the following deficits: muscle weakness, decreased cardiorespiratoy endurance, abnormal tone, unbalanced muscle activation, ataxia and decreased coordination, decreased problem solving and decreased memory and decreased sitting balance, decreased standing balance, decreased postural control, hemiplegia and decreased balance strategies and therefore will continue to benefit from skilled OT intervention to enhance overall performance with BADL and Reduce care partner burden.  Patient progressing toward long term goals..  Continue plan of care.  OT Short Term Goals Week 3:  OT Short Term Goal 1 (Week 3): LTG=STG 2.2 ELOS  Skilled Therapeutic Interventions/Progress Updates:    Pt greeted semi-reclined in bed and agreeable to OT treatment session. Pt reported he had already gotten dressed and did not want to bathe today. Pt came to sitting EOB with supervision. Pt completed sit<>stand w/ RW and min A. Verbal and tactile cues to achieve full hip and trunk extension prior to pivoting to the wc. Min A for stand-pivot w/ RW. Pt brought down to therapy gym and completed stand-pivot w/ RW in similar fashion to therapy mat. Pt needs verbal cues for hand placement and  controlled descent to sit. Worked on standing balance/endurance and functional use of R UE with horse shoe hanging task on taller basketball rim. Incorporated trunk rotation and R shoulder ROM with tossing horse shoes back onto mat behind pt in standing. Functional ambulation 5 feet w/ RW and min A before reaching max fatigue. Pt returned to room and requested to return to bed. Min A squat-pivot with use of bed rails. Pt left semi-reclined in bed with bed alarm on, call bell in reach, and needs met.   Therapy Documentation Precautions:  Precautions Precautions: Fall Precaution Comments: watch HR Restrictions Weight Bearing Restrictions: No Pain:   Pt reports pain in R LE, no number given. Rest and repositioned for comfort.    Therapy/Group: Individual Therapy  Valma Cava 07/12/2020, 11:03 AM

## 2020-07-12 NOTE — Progress Notes (Signed)
Physical Therapy Weekly Progress Note  Patient Details  Name: Andres White MRN: [id number] Date of Birth: [birth year]  Beginning of progress report period: July 04, 2020 End of progress report period: July 12, 2020  Today's Date: 07/12/2020 PT Individual Time: 0802-0911 PT Individual Time Calculation (min): 69 min   Patient has met 1 of 3 short term goals. Pt progressing toward therapy goals, improving independence with bed mobility, balance, transfers, and ambulation. Pt had been making very good progress toward goals earlier in reporting period, but last several sessions pt has regressed, complaining of increased pain in both feet and limiting tolerance to standing activities. Pt also had tachycardia with activity and fatigues quickly. Pt mobility dependent on level of pain in feet and pt also demonstrates poor insight into situation and deficits. Focus of coming week to be improved independence with transfers, balance, ambulation, and DC prep.  Patient continues to demonstrate the following deficits muscle weakness, decreased cardiorespiratoy endurance, decreased awareness, decreased problem solving, decreased safety awareness and decreased memory and decreased sitting balance, decreased standing balance and decreased balance strategies and therefore will continue to benefit from skilled PT intervention to increase functional independence with mobility.  Patient progressing toward long term goals..  Continue plan of care.  PT Short Term Goals Week 2:  PT Short Term Goal 1 (Week 2): Pt will perform bed mobility with CGA PT Short Term Goal 1 - Progress (Week 2): Met PT Short Term Goal 2 (Week 2): Pt will perform bed to chair transfer consistently with CGA. PT Short Term Goal 2 - Progress (Week 2): Progressing toward goal PT Short Term Goal 3 (Week 2): Pt will ambulate 150' with CGA and LRAD. PT Short Term Goal 3 - Progress (Week 2): Progressing toward goal Week 3:  PT Short Term Goal  1 (Week 3): STGs=LTGs  Skilled Therapeutic Interventions/Progress Updates:     Pt received supine in bed and agrees to therapy. Reports pain in both feet, R>L. Supine to sit with supervision and cues for hand placement and body mechanics. Stand pivot transfer to [facility] with RW and minA. WC transport to therapy gym for time management. Pt performs NMR for standing balance and activity tolerance with focus on reaction and spontaneous weight shifting. Pt stands with RW and close supervision, though transfer appears very antalgic and pt takes increased time to complete. PT provides verbal and tactile cues for posture including increased hip and trunk extension. Pt then uses bilateral upper extremities to toss ball into trampoline and catch rebound. PT provides minA at hips for stability. Pt also incorporates overhead ball toss to encourage increased extension and promote increased weight shifting. Pt able to complete several bouts of x20 reps, and requires extended seated rest break between each bout. PT assesses vitals and pt's HR up to 140 immediately following activity. Pt takes extended break prior to additional mobility. Pt attempts ambulation but only takes several steps prior to verbalizing need for bowel movement. WC transport to room for time management. Stand pivot to toilet with minA and grab bars. Pt is continent of bowel and performs pericare with set-up assist. Pt requests to return to bed due to fatigue following toileting. Stand step transfer back to bed with minA. Return to supine with minA. Left with alarm intact and all needs within reach.  Therapy Documentation Precautions:  Precautions Precautions: Fall Precaution Comments: watch HR Restrictions Weight Bearing Restrictions: No   Therapy/Group: Individual Therapy  Breck Coons, PT, DPT 07/12/2020, 4:10 PM

## 2020-07-13 ENCOUNTER — Encounter (HOSPITAL_COMMUNITY): Payer: Medicare Other | Admitting: Psychology

## 2020-07-13 ENCOUNTER — Inpatient Hospital Stay (HOSPITAL_COMMUNITY): Payer: Medicare Other | Admitting: Occupational Therapy

## 2020-07-13 ENCOUNTER — Encounter (HOSPITAL_COMMUNITY): Payer: Self-pay | Admitting: Physical Medicine & Rehabilitation

## 2020-07-13 ENCOUNTER — Encounter (HOSPITAL_COMMUNITY): Payer: Medicare Other | Admitting: Speech Pathology

## 2020-07-13 ENCOUNTER — Inpatient Hospital Stay (HOSPITAL_COMMUNITY): Payer: Medicare Other | Admitting: Certified Registered Nurse Anesthetist

## 2020-07-13 ENCOUNTER — Ambulatory Visit (HOSPITAL_COMMUNITY): Payer: Medicare Other

## 2020-07-13 ENCOUNTER — Encounter (HOSPITAL_COMMUNITY): Payer: Medicare Other | Admitting: Occupational Therapy

## 2020-07-13 ENCOUNTER — Encounter (HOSPITAL_COMMUNITY)
Admission: RE | Disposition: A | Discharge status: Home Health | Payer: Self-pay | Source: Other Acute Inpatient Hospital | Attending: Physical Medicine & Rehabilitation

## 2020-07-13 ENCOUNTER — Inpatient Hospital Stay (HOSPITAL_COMMUNITY): Payer: Medicare Other

## 2020-07-13 DIAGNOSIS — K317 Polyp of stomach and duodenum: Secondary | ICD-10-CM

## 2020-07-13 DIAGNOSIS — R195 Other fecal abnormalities: Secondary | ICD-10-CM

## 2020-07-13 DIAGNOSIS — K635 Polyp of colon: Secondary | ICD-10-CM

## 2020-07-13 DIAGNOSIS — D649 Anemia, unspecified: Secondary | ICD-10-CM

## 2020-07-13 DIAGNOSIS — K2970 Gastritis, unspecified, without bleeding: Secondary | ICD-10-CM

## 2020-07-13 HISTORY — PX: COLONOSCOPY WITH PROPOFOL: SHX5780

## 2020-07-13 HISTORY — PX: POLYPECTOMY: SHX5525

## 2020-07-13 HISTORY — PX: BIOPSY: SHX5522

## 2020-07-13 HISTORY — PX: ESOPHAGOGASTRODUODENOSCOPY: SHX5428

## 2020-07-13 LAB — FERRITIN: Ferritin: 932 ng/mL — ABNORMAL HIGH (ref 24–336)

## 2020-07-13 LAB — CBC
HCT: 27.5 % — ABNORMAL LOW (ref 39.0–52.0)
Hemoglobin: 8.7 g/dL — ABNORMAL LOW (ref 13.0–17.0)
MCH: 30 pg (ref 26.0–34.0)
MCHC: 31.6 g/dL (ref 30.0–36.0)
MCV: 94.8 fL (ref 80.0–100.0)
Platelets: 326 10*3/uL (ref 150–400)
RBC: 2.9 MIL/uL — ABNORMAL LOW (ref 4.22–5.81)
RDW: 13.4 % (ref 11.5–15.5)
WBC: 17.5 10*3/uL — ABNORMAL HIGH (ref 4.0–10.5)
nRBC: 0 % (ref 0.0–0.2)

## 2020-07-13 LAB — IRON AND TIBC
Iron: 67 ug/dL (ref 45–182)
Saturation Ratios: 24 % (ref 17.9–39.5)
TIBC: 280 ug/dL (ref 250–450)
UIBC: 213 ug/dL

## 2020-07-13 LAB — FOLATE: Folate: 13.5 ng/mL (ref >5.9)

## 2020-07-13 LAB — BASIC METABOLIC PANEL
Anion gap: 10 (ref 5–15)
BUN: 28 mg/dL — ABNORMAL HIGH (ref 8–23)
CO2: 25 mmol/L (ref 22–32)
Calcium: 9 mg/dL (ref 8.9–10.3)
Chloride: 102 mmol/L (ref 98–111)
Creatinine, Ser: 1.29 mg/dL — ABNORMAL HIGH (ref 0.61–1.24)
GFR, Estimated: 57 mL/min — ABNORMAL LOW (ref >60)
Glucose, Bld: 103 mg/dL — ABNORMAL HIGH (ref 70–99)
Potassium: 4.9 mmol/L (ref 3.5–5.1)
Sodium: 137 mmol/L (ref 135–145)

## 2020-07-13 LAB — GLUCOSE, CAPILLARY: Glucose-Capillary: 120 mg/dL — ABNORMAL HIGH (ref 70–99)

## 2020-07-13 LAB — VITAMIN B12: Vitamin B-12: 446 pg/mL (ref 180–914)

## 2020-07-13 SURGERY — COLONOSCOPY WITH PROPOFOL
Anesthesia: Monitor Anesthesia Care

## 2020-07-13 MED ORDER — PROPOFOL 10 MG/ML IV BOLUS
INTRAVENOUS | Status: DC | PRN
  Administered 2020-07-13: 10 mg via INTRAVENOUS
  Administered 2020-07-13: 20 mg via INTRAVENOUS
  Administered 2020-07-13: 10 mg via INTRAVENOUS
  Administered 2020-07-13 (×3): 20 mg via INTRAVENOUS

## 2020-07-13 MED ORDER — AMOXICILLIN 250 MG PO CAPS
250.0000 mg | ORAL_CAPSULE | Freq: Three times a day (TID) | ORAL | Status: AC
  Administered 2020-07-13 – 2020-07-16 (×9): 250 mg via ORAL
  Filled 2020-07-13 (×9): qty 1

## 2020-07-13 MED ORDER — EPHEDRINE SULFATE-NACL 50-0.9 MG/10ML-% IV SOSY
PREFILLED_SYRINGE | INTRAVENOUS | Status: DC | PRN
  Administered 2020-07-13 (×2): 5 mg via INTRAVENOUS

## 2020-07-13 MED ORDER — LIDOCAINE 2% (20 MG/ML) 5 ML SYRINGE
INTRAMUSCULAR | Status: DC | PRN
  Administered 2020-07-13: 20 mg via INTRAVENOUS
  Administered 2020-07-13: 60 mg via INTRAVENOUS
  Administered 2020-07-13: 20 mg via INTRAVENOUS

## 2020-07-13 MED ORDER — ONDANSETRON HCL 4 MG/2ML IJ SOLN
INTRAMUSCULAR | Status: DC | PRN
  Administered 2020-07-13: 4 mg via INTRAVENOUS

## 2020-07-13 MED ORDER — LACTATED RINGERS IV SOLN
INTRAVENOUS | Status: DC | PRN
Start: 2020-07-13 — End: 2020-07-13

## 2020-07-13 MED ORDER — PROPOFOL 500 MG/50ML IV EMUL
INTRAVENOUS | Status: DC | PRN
  Administered 2020-07-13: 125 ug/kg/min via INTRAVENOUS

## 2020-07-13 MED ORDER — PHENYLEPHRINE 40 MCG/ML (10ML) SYRINGE FOR IV PUSH (FOR BLOOD PRESSURE SUPPORT)
PREFILLED_SYRINGE | INTRAVENOUS | Status: DC | PRN
  Administered 2020-07-13: 80 ug via INTRAVENOUS
  Administered 2020-07-13: 120 ug via INTRAVENOUS
  Administered 2020-07-13: 80 ug via INTRAVENOUS
  Administered 2020-07-13: 200 ug via INTRAVENOUS
  Administered 2020-07-13 (×2): 80 ug via INTRAVENOUS
  Administered 2020-07-13: 120 ug via INTRAVENOUS
  Administered 2020-07-13: 80 ug via INTRAVENOUS

## 2020-07-13 MED ORDER — AMLODIPINE BESYLATE 2.5 MG PO TABS
2.5000 mg | ORAL_TABLET | Freq: Every day | ORAL | Status: DC
  Administered 2020-07-13 – 2020-07-15 (×3): 2.5 mg via ORAL
  Filled 2020-07-13 (×3): qty 1

## 2020-07-13 SURGICAL SUPPLY — 22 items

## 2020-07-13 NOTE — Anesthesia Procedure Notes (Signed)
Procedure Name: MAC Date/Time: 07/13/2020 9:37 AM Performed by: Dorthea Cove, CRNA Pre-anesthesia Checklist: Emergency Drugs available, Patient identified, Suction available, Patient being monitored and Timeout performed Patient Re-evaluated:Patient Re-evaluated prior to induction Oxygen Delivery Method: Nasal cannula Preoxygenation: Pre-oxygenation with 100% oxygen Induction Type: IV induction

## 2020-07-13 NOTE — Progress Notes (Addendum)
Pt down for colonoscopy at this time. No complications noted. Morning meds held.  Sheela Stack, LPN

## 2020-07-13 NOTE — Progress Notes (Signed)
Occupational Therapy Note  Patient Details  Name: Andres White MRN: [id number] Date of Birth: [birth year]  Today's Date: 07/13/2020 OT Missed Time: 30 Minutes Missed Time Reason: Out of hospital appointment (Pt off the unit for colonoscopy)  Patient off the unit for colonoscopy procedure. Will follow up when patient returns to the unit.    [name] 07/13/2020, 1:48 PM

## 2020-07-13 NOTE — Progress Notes (Signed)
Physical Therapy Session Note  Patient Details  Name: Andres White MRN: [id number] Date of Birth: [birth year]  Today's Date: 07/13/2020 PT Missed Time: 45 Minutes Missed Time Reason: Unavailable (Comment) (off floor for colonoscopy)  Short Term Goals: Week 3:  PT Short Term Goal 1 (Week 3): STGs=LTGs  Skilled Therapeutic Interventions/Progress Updates:     Pt off floor for colonoscopy. PT to follow up.  Therapy Documentation Precautions:  Precautions Precautions: Fall Precaution Comments: watch HR Restrictions Weight Bearing Restrictions: No General: PT Amount of Missed Time (min): 45 Minutes PT Missed Treatment Reason: Unavailable (Comment) (off floor for colonoscopy)   Therapy/Group: Individual Therapy  Breck Coons, PT, DPT 07/13/2020, 4:04 PM

## 2020-07-13 NOTE — Anesthesia Preprocedure Evaluation (Signed)
Anesthesia Evaluation  Patient identified by MRN, date of birth, ID band Patient awake    Reviewed: Allergy & Precautions, NPO status , Patient's Chart, lab work & pertinent test results  Airway Mallampati: II  TM Distance: >3 FB Neck ROM: Full    Dental  (+) Edentulous Upper, Edentulous Lower   Pulmonary Current Smoker and Patient abstained from smoking.,    Pulmonary exam normal breath sounds clear to auscultation       Cardiovascular hypertension, Pt. on medications Normal cardiovascular exam Rhythm:Regular Rate:Normal     Neuro/Psych CVA, No Residual Symptoms negative psych ROS   GI/Hepatic Neg liver ROS, Bowel prep,  Endo/Other  negative endocrine ROS  Renal/GU Renal disease     Musculoskeletal  (+) Arthritis , Gout Chronic back pain   Abdominal   Peds  Hematology  (+) anemia , HLD   Anesthesia Other Findings FOBT positive anemia.  Undetermined types of colon polyps removed in 2015.  Reproductive/Obstetrics                             Anesthesia Physical Anesthesia Plan  ASA: III  Anesthesia Plan: MAC   Post-op Pain Management:    Induction: Intravenous  PONV Risk Score and Plan: 0 and Propofol infusion and Treatment may vary due to age or medical condition  Airway Management Planned: Nasal Cannula  Additional Equipment:   Intra-op Plan:   Post-operative Plan:   Informed Consent: I have reviewed the patients History and Physical, chart, labs and discussed the procedure including the risks, benefits and alternatives for the proposed anesthesia with the patient or authorized representative who has indicated his/her understanding and acceptance.       Plan Discussed with: CRNA  Anesthesia Plan Comments:         Anesthesia Quick Evaluation

## 2020-07-13 NOTE — Progress Notes (Signed)
Occupational Therapy Session Note  Patient Details  Name: NATALIO SALOIS MRN: [id number] Date of Birth: 05-Dec-1945  Today's Date: 07/13/2020 OT Individual Time: 1300-1343 OT Individual Time Calculation (min): 43 min    Short Term Goals: Week 3:  OT Short Term Goal 1 (Week 3): LTG=STG 2.2 ELOS  Skilled Therapeutic Interventions/Progress Updates:    Patient greeted semi-reclined in bed and agreeable to OT treatment session. Pt's daughter on her way up for family education. Pt washed peri-area in bed and OT assisted with donning clean brief. Pt then came to sitting EOB with supervision. OT educated pt's daughter on BADL participation and modifications. Pt donned pants at EOB with supervision, then needed min A sit<>stand with RW and min A to pull up pants. Pt returned to sitting and donned shirt with set-up A. Educated daughter on positioning for RW and she provided min A to ambulate to the wc~ 5 feet. Pt brought down to tub room and practiced tub bench transfer with min A to ambulate and supervision for lifting B LEs in and out of tub. Pt returned to room and left seated in wc with daughter present to await next therapy session.   Therapy Documentation Precautions:  Precautions Precautions: Fall Precaution Comments: watch HR Restrictions Weight Bearing Restrictions: No Pain: Pain Assessment Pain Scale: 0-10 Pain Score: 0-No pain   Therapy/Group: Individual Therapy  Valma Cava 07/13/2020, 1:50 PM

## 2020-07-13 NOTE — Progress Notes (Signed)
Pt return to unit per bed. No complications noted.  Sheela Stack, LPN

## 2020-07-13 NOTE — Progress Notes (Signed)
Speech Language Pathology Weekly Progress and Session Note  Patient Details  Name: Andres White MRN: [id number] Date of Birth: 07-May-1946  Beginning of progress report period: July 05, 2020 End of progress report period: July 13, 2020  Today's Date: 07/13/2020 SLP Individual Time: 4742-5956 SLP Individual Time Calculation (min): 26 min  Short Term Goals: Week 2: SLP Short Term Goal 1 (Week 2): Pt will self-monitor and correct errors during functional tasks wth Min verbal cues. SLP Short Term Goal 1 - Progress (Week 2): Not met SLP Short Term Goal 2 (Week 2): Pt will recall new and/or daily information with Min A for use of compensatory strategies or aids. SLP Short Term Goal 2 - Progress (Week 2): Not met SLP Short Term Goal 3 (Week 2): Pt will recall 2 safety precuations with Min A cues for use of aids or strategies. SLP Short Term Goal 3 - Progress (Week 2): Not met SLP Short Term Goal 4 (Week 2): Pt will demonstrate ability to problem solving basic to mildly complex functional tasks with Min A verbal/visual cues. SLP Short Term Goal 4 - Progress (Week 2): Not met SLP Short Term Goal 5 (Week 2): Patient will consume current diet with minimal overt s/s of aspiration with Mod I for use of swallowing compensatory strategies. SLP Short Term Goal 5 - Progress (Week 2): Met SLP Short Term Goal 6 (Week 2): Patient will demonstrate efficient mastication and complete oral clearance without overt s/s of aspiration with trials of Dys. 3 textures over 2 sessions prior to upgrade. SLP Short Term Goal 6 - Progress (Week 2): Met    New Short Term Goals: Week 3: SLP Short Term Goal 1 (Week 3): STGs=LTGs due to ELOS  Weekly Progress Updates: Patient's progress has been inconsistent and patient has me 2 of 6 STGs this reporting period. Currently, patient is consuming Dys. 3 textures with thin liquids with minimal overt s/s of aspiration and overall Mod I for use of swallowing compensatory  strategies. Patient currently requires overall Mod A multimodal cues to complete functional and familiar tasks safely in regards to problem solving, recall and awareness. Patient and family education ongoing.  Patient would benefit from continued skilled SLP intervention to maximize his cognitive functioning and overall functional independence prior to discharge.      Intensity: Minumum of 1-2 x/day, 30 to 90 minutes Frequency: 3 to 5 out of 7 days Duration/Length of Stay: 07/18/20 Treatment/Interventions: Dysphagia/aspiration precaution training;Cueing hierarchy;Therapeutic Activities;Patient/family education;Functional tasks;Environmental controls;Internal/external aids;Cognitive remediation/compensation   Daily Session  Skilled Therapeutic Interventions: Skilled treatment session focused on completion of family education with the patient and his daughter. SLP facilitated session by providing education regarding patient's current swallowing function, diet recommendations, appropriate textures and compensatory strategies. She verbalized understanding. SLP also facilitated session by providing education regarding patient's current cognitive functioning and the importance of 24 hour supervision to maximize safety and overall cognitive functioning. She was also provided handouts for memory compensatory strategies. She verbalized understanding of all information. Patient left upright in the wheelchair with daughter present. Continue with current plan of care.      Pain No/Denies Pain   Therapy/Group: Individual Therapy  Meia Emley 07/13/2020, 6:28 AM

## 2020-07-13 NOTE — Progress Notes (Signed)
Patient ID: Andres White, male   DOB: 27-Sep-1945, 74 y.o.   MRN: [id number]  SW met with pt and pt dtr Heather in room to review discharge recommendations: HHPT/OT/SLP and DME: adding 3in1 BSC, TTB, and w/c. SW informed TTB will not be covered by insurance, and will see if w/c will be covered under insurance. Dtr states she already spoke with [facility] about RW that was ordered last week. Preferred [facility] is [place]. Prefers to not have [facility].   SW spoke with [facility] about referral. SW waiting on follow-up.    Loralee Pacas, MSW, [place] Office: [phone] Cell: [phone] Fax: [phone]

## 2020-07-13 NOTE — Interval H&P Note (Signed)
History and Physical Interval Note:  07/13/2020 9:07 AM  Andres White  has presented today for surgery, with the diagnosis of FOBT positive anemia.  Undetermined types of colon polyps removed in 2015..  The various methods of treatment have been discussed with the patient and family. After consideration of risks, benefits and other options for treatment, the patient has consented to  Procedure(s): COLONOSCOPY WITH PROPOFOL (N/A) ESOPHAGOGASTRODUODENOSCOPY (EGD) (N/A) as a surgical intervention.  The patient's history has been reviewed, patient examined, no change in status, stable for surgery.  I have reviewed the patient's chart and labs.  Questions were answered to the patient's satisfaction.     Pricilla Riffle. Fuller Plan

## 2020-07-13 NOTE — Op Note (Addendum)
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 Fuller Plan, MD, Glori Bickers, RN, Lesia Sago, Technician Referring MD:             Alger Simons, MD Medicines:                Monitored Anesthesia Care Complications:            No immediate complications. Estimated blood loss:                            None. Estimated Blood Loss:     Estimated blood loss: none. Procedure:                Pre-Anesthesia Assessment:                           - Prior to the procedure, a History and Physical                            was performed, and patient medications and                            allergies were reviewed. The patient's tolerance of                            previous anesthesia was also reviewed. The risks                            and benefits of the procedure and the sedation                            options and risks were discussed with the patient.                            All questions were answered, and informed consent                            was obtained. Prior Anticoagulants: The patient has                            taken no previous anticoagulant or antiplatelet                            agents. ASA Grade Assessment: III - A patient with  severe systemic disease. After reviewing the risks                            and benefits, the patient was deemed in                            satisfactory condition to undergo the procedure.                           After obtaining informed consent, the colonoscope                            was passed under direct vision. Throughout the                             procedure, the patient's blood pressure, pulse, and                            oxygen saturations were monitored continuously. The                            CF-HQ190L ([id number] Olympus colonoscope was                            introduced through the anus and advanced to the the                            cecum, identified by appendiceal orifice and                            ileocecal valve. The ileocecal valve, appendiceal                            orifice, and rectum were photographed. The quality                            of the bowel preparation was inadequate. The                            colonoscopy was performed without difficulty. The                            patient tolerated the procedure well. Scope In: 9:42:01 AM Scope Out: 10:12:19 AM Scope Withdrawal Time: 0 hours 18 minutes 40 seconds  Total Procedure Duration: 0 hours 30 minutes 18 seconds  Findings:      The perianal and digital rectal examinations were normal.      A large amount of semi-liquid stool was found in the entire colon,       interfering with visualization. Lavage of the area was performed using a       large amount of tap water, resulting in incomplete clearance with fair       visualization.      Eight sessile polyps were found in the sigmoid colon (1), descending       colon (2), transverse  colon (4) and ascending colon (1). The polyps were       5 to 8 mm in size. Not removed due to large amount of stool. I was       concerned that retrieving the polyps could be problematic. The       colonoscopy needs to be repeated with more extensive bowel prep at which       time polypectomies will be performed.      Non-bleeding internal hemorrhoids were found during retroflexion. The       hemorrhoids were small and Grade I (internal hemorrhoids that do not       prolapse).      The exam was otherwise without abnormality on direct and retroflexion       views. Impression:               - Preparation of the  colon was inadequate.                           - Stool in the entire examined colon.                           - Eight 5 to 8 mm polyps in the sigmoid colon, in                            the descending colon, in the transverse colon and                            in the ascending colon. Not removed due to large                            amount of stool.                           - Non-bleeding internal hemorrhoids.                           - The examination was otherwise normal on direct                            and retroflexion views. 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                           - Return patient to hospital ward for ongoing care.                           -  Resume previous diet.                           - Continue present medications.                           - EGD today as planned for further evaluation of                            heme + stool and anemia. Procedure Code(s):        --- Professional ---                           [phone], Colonoscopy, flexible; with removal of                            tumor(s), polyp(s), or other lesion(s) by snare                            technique Diagnosis Code(s):        --- Professional ---                           K64.0, First degree hemorrhoids                           K63.5, Polyp of colon                           R19.5, Other fecal abnormalities                           D50.0, Iron deficiency anemia secondary to blood                            loss (chronic) CPT copyright 2019 American Medical Association. All rights reserved. The codes documented in this report are preliminary and upon coder review may  be revised to meet current compliance requirements. Ladene Artist, MD 07/13/2020 10:21:23 AM This report has been signed electronically. Number of Addenda: 0

## 2020-07-13 NOTE — Progress Notes (Signed)
Physical Therapy Session Note  Patient Details  Name: [name] MRN: [id number] Date of Birth: [birth year]  Today's Date: 07/13/2020 PT Individual Time: 3567-0141 PT Individual Time Calculation (min): 43 min   Short Term Goals: Week 3:  PT Short Term Goal 1 (Week 3): STGs=LTGs  Skilled Therapeutic Interventions/Progress Updates:     Pt received seated in [facility] and agrees to therapy. No complaint of pain initially but apparent that both feet hurt when pt attempts ambulating. PT provides rest breaks to manage pain symptoms. WC transport to gym for time management and energy conservation. Multiple reps of sit to stand with RW and CGA with cues on hand placement and body mechanics. Pt ambulates 115' x2 with extended seated rest break. Pt utilizes RW and PT provides CGA/minA  Pt ambulates with very short bilateral stride lengths, increased WB through RW, and forward flexed posture. Able to correct partially with verbal and tactile cues from PT. Pt performs initiation of stair training with use of 5 inch platform. Pt utilizes RW for support and performs x3 and x8 step ups and step backs with minA at hips and verbal cues on sequencing and positioning. Extended seated rest break between bouts. WC transport back to room. Pt left seated in [facility] with daughter present and all needs within reach.  Therapy Documentation Precautions:  Precautions Precautions: Fall Precaution Comments: watch HR Restrictions Weight Bearing Restrictions: No   Therapy/Group: Individual Therapy  Breck Coons, PT, DPT 07/13/2020, 4:03 PM

## 2020-07-13 NOTE — Transfer of Care (Signed)
Immediate Anesthesia Transfer of Care Note  Patient: Andres White  Procedure(s) Performed: COLONOSCOPY WITH PROPOFOL (N/A ) ESOPHAGOGASTRODUODENOSCOPY (EGD) (N/A ) POLYPECTOMY BIOPSY  Patient Location: Endoscopy Unit  Anesthesia Type:MAC  Level of Consciousness: awake, alert  and oriented  Airway & Oxygen Therapy: Patient Spontanous Breathing  Post-op Assessment: Report given to RN and Post -op Vital signs reviewed and stable  Post vital signs: Reviewed and stable  Last Vitals:  Vitals Value Taken Time  BP 147/70   Temp    Pulse 95   Resp 12   SpO2 97     Last Pain:  Vitals:   07/13/20 0847  TempSrc: Oral  PainSc: 0-No pain      Patients Stated Pain Goal: 0 (53/66/44 0347)  Complications: No complications documented.

## 2020-07-13 NOTE — Op Note (Addendum)
[facility] Patient Name: Andres White Procedure Date : 07/13/2020 MRN: [id number] Attending MD: Ladene Artist , MD Date of Birth: [birth year] CSN: [id number] Age: 74 Admit Type: Outpatient Procedure:                Upper GI endoscopy Indications:              Anemia secondary to chronic blood loss, Heme                            positive stool Providers:                Pricilla Riffle. Fuller Plan, MD, Glori Bickers, RN, Lesia Sago, Technician Referring MD:             Alger Simons, MD Medicines:                Monitored Anesthesia Care Complications:            No immediate complications. Estimated Blood Loss:     Estimated blood loss was minimal. Procedure:                Pre-Anesthesia Assessment:                           - Prior to the procedure, a History and Physical                            was performed, and patient medications and                            allergies were reviewed. The patient's tolerance of                            previous anesthesia was also reviewed. The risks                            and benefits of the procedure and the sedation                            options and risks were discussed with the patient.                            All questions were answered, and informed consent                            was obtained. Prior Anticoagulants: The patient has                            taken no previous anticoagulant or antiplatelet                            agents. ASA Grade Assessment: III - A patient with  severe systemic disease. After reviewing the risks                            and benefits, the patient was deemed in                            satisfactory condition to undergo the procedure.                           - Prior to the procedure, a History and Physical                            was performed, and patient medications and                            allergies  were reviewed. The patient's tolerance of                            previous anesthesia was also reviewed. The risks                            and benefits of the procedure and the sedation                            options and risks were discussed with the patient.                            All questions were answered, and informed consent                            was obtained. Prior Anticoagulants: The patient has                            taken no previous anticoagulant or antiplatelet                            agents. ASA Grade Assessment: III - A patient with                            severe systemic disease. After reviewing the risks                            and benefits, the patient was deemed in                            satisfactory condition to undergo the procedure.                           After obtaining informed consent, the endoscope was                            passed under direct vision. Throughout the  procedure, the patient's blood pressure, pulse, and                            oxygen saturations were monitored continuously. The                            [id number]-[id number] ([id number]) Olympus gastroscope was                            introduced through the mouth, and advanced to the                            second part of duodenum. The upper GI endoscopy was                            accomplished without difficulty. The patient                            tolerated the procedure well. Scope In: Scope Out: Findings:      The examined esophagus was normal.      Patchy mild inflammation characterized by erosions and granularity was       found in the gastric body and in the gastric antrum. Biopsies were taken       with a cold forceps for histology.      The exam of the stomach was otherwise normal.      Three 5 to 7 mm sessile polyps with no bleeding were found in the second       portion of the duodenum (1) and in the third portion of  the duodenum       (2). Biopsies were taken with a cold forceps for histology in separate       specimen jars.      The exam of the duodenum was otherwise normal. Impression:               - Normal esophagus.                           - Mild gastritis. Biopsied.                           - Three duodenal polyps. Biopsied. Recommendation:           - Return patient to hospital ward for ongoing care.                           - Resume previous diet.                           - Continue present medications.                           - Await pathology results.                           - Protonix (pantoprazole) 40 mg PO daily.                           -  GI signing off. Outpatient GI office follow up in                            6 weeks. Procedure Code(s):        --- Professional ---                           [phone], Esophagogastroduodenoscopy, flexible,                            transoral; with biopsy, single or multiple Diagnosis Code(s):        --- Professional ---                           K29.70, Gastritis, unspecified, without bleeding                           K31.7, Polyp of stomach and duodenum                           D50.0, Iron deficiency anemia secondary to blood                            loss (chronic)                           R19.5, Other fecal abnormalities CPT copyright 2019 American Medical Association. All rights reserved. The codes documented in this report are preliminary and upon coder review may  be revised to meet current compliance requirements. Ladene Artist, MD 07/13/2020 10:46:38 AM This report has been signed electronically. Number of Addenda: 0

## 2020-07-13 NOTE — Progress Notes (Signed)
[place] PHYSICAL MEDICINE & REHABILITATION PROGRESS NOTE  Subjective/Complaints: PT reports that Mr. Mundorf HR was up to 120's with therapy. He felt tired, sl dizzy. Feeling better since getting back in bed. Asked him if he'd every seen a GI doctor before, and he hasn't. Had formed bm this morning without blood. Feet feeling ok.  ROS: Patient denies fever, rash, sore throat, blurred vision, nausea, vomiting, diarrhea, cough,  chest pain, joint or back pain, headache, or mood change.     Objective: Vital Signs: Blood pressure (!) 155/68, pulse 90, temperature (!) 97.4 F (36.3 C), temperature source Tympanic, resp. rate 17, height 5\' 7"  (1.702 m), weight 85 kg, SpO2 98 %. VAS [place] LOWER EXTREMITY VENOUS (DVT)  Result Date: 07/12/2020  Lower Venous DVT Study Indications: Swelling.  Performing Technologist: Vonzell Schlatter RVT  Examination Guidelines: A complete evaluation includes B-mode imaging, spectral Doppler, color Doppler, and power Doppler as needed of all accessible portions of each vessel. Bilateral testing is considered an integral part of a complete examination. Limited examinations for reoccurring indications may be performed as noted. The reflux portion of the exam is performed with the patient in reverse Trendelenburg.  +---------+---------------+---------+-----------+----------+--------------+ RIGHT    CompressibilityPhasicitySpontaneityPropertiesThrombus Aging +---------+---------------+---------+-----------+----------+--------------+ CFV      Full           Yes      Yes                                 +---------+---------------+---------+-----------+----------+--------------+ SFJ      Full                                                        +---------+---------------+---------+-----------+----------+--------------+ FV Prox  Full                                                        +---------+---------------+---------+-----------+----------+--------------+  FV Mid   Full                                                        +---------+---------------+---------+-----------+----------+--------------+ FV DistalFull                                                        +---------+---------------+---------+-----------+----------+--------------+ PFV      Full                                                        +---------+---------------+---------+-----------+----------+--------------+ POP      Full           Yes      Yes                                 +---------+---------------+---------+-----------+----------+--------------+  PTV      Full                                                        +---------+---------------+---------+-----------+----------+--------------+ PERO     Full                                                        +---------+---------------+---------+-----------+----------+--------------+   +---------+---------------+---------+-----------+----------+--------------+ LEFT     CompressibilityPhasicitySpontaneityPropertiesThrombus Aging +---------+---------------+---------+-----------+----------+--------------+ CFV      Full           Yes      Yes                                 +---------+---------------+---------+-----------+----------+--------------+ SFJ      Full                                                        +---------+---------------+---------+-----------+----------+--------------+ FV Prox  Full                                                        +---------+---------------+---------+-----------+----------+--------------+ FV Mid   Full                                                        +---------+---------------+---------+-----------+----------+--------------+ FV DistalFull                                                        +---------+---------------+---------+-----------+----------+--------------+ PFV      Full                                                         +---------+---------------+---------+-----------+----------+--------------+ POP      Full           Yes      Yes                                 +---------+---------------+---------+-----------+----------+--------------+ PTV      Full                                                        +---------+---------------+---------+-----------+----------+--------------+  PERO     Full                                                        +---------+---------------+---------+-----------+----------+--------------+     Summary: RIGHT: - There is no evidence of deep vein thrombosis in the lower extremity.  - No cystic structure found in the popliteal fossa.  LEFT: - There is no evidence of deep vein thrombosis in the lower extremity.  - No cystic structure found in the popliteal fossa.  *See table(s) above for measurements and observations. Electronically signed by Monica Martinez MD on 07/12/2020 at 4:54:51 PM.    Final    Recent Labs    07/12/20 0303 07/13/20 0412  WBC 18.5* 17.5*  HGB 8.6* 8.7*  HCT 26.8* 27.5*  PLT 330 326   Recent Labs    07/13/20 0412  NA 137  K 4.9  CL 102  CO2 25  GLUCOSE 103*  BUN 28*  CREATININE 1.29*  CALCIUM 9.0    Intake/Output Summary (Last 24 hours) at 07/13/2020 1236 Last data filed at 07/13/2020 1207 Gross per 24 hour  Intake 289 ml  Output 925 ml  Net -636 ml        Physical Exam: BP (!) 155/68   Pulse 90   Temp (!) 97.4 F (36.3 C) (Tympanic)   Resp 17   Ht 5\' 7"  (1.702 m)   Wt 85 kg   SpO2 98%   BMI 29.35 kg/m   Constitutional: No distress . Vital signs reviewed. HEENT: EOMI, oral membranes moist Neck: supple Cardiovascular: RRR without murmur. No JVD    Respiratory/Chest: CTA Bilaterally without wheezes or rales. Normal effort    GI/Abdomen: BS +, non-tender, non-distended Ext: no clubbing, cyanosis, or edema Psych: pleasant and cooperative Musculoskeletal: Right foot pain resolved, 1st  MTP swelling as well as R ankle swelling is minimal, pain with R knee ROM no sig effusion, also with tenderness R olecranon bursa, small bursal fluid collection  dysarthric Motor: LUE/LLE: 5/5 proximal to distal RUE:   3+/5 proximal to distal with ataxia    RLE:  3- to3 /5 proximal to distal  Assessment/Plan: 1. Functional deficits which require 3+ hours per day of interdisciplinary therapy in a comprehensive inpatient rehab setting.  Physiatrist is providing close team supervision and 24 hour management of active medical problems listed below.  Physiatrist and rehab team continue to assess barriers to discharge/monitor patient progress toward functional and medical goals   Care Tool:  Bathing    Body parts bathed by patient: Abdomen,Chest,Front perineal area,Left upper leg,Right upper leg,Face,Right arm   Body parts bathed by helper: Right lower leg,Left lower leg,Buttocks,Left arm     Bathing assist Assist Level: Moderate Assistance - Patient 50 - 74%     Upper Body Dressing/Undressing Upper body dressing   What is the patient wearing?: Pull over shirt    Upper body assist Assist Level: Minimal Assistance - Patient > 75%    Lower Body Dressing/Undressing Lower body dressing      What is the patient wearing?: Pants,Incontinence brief     Lower body assist Assist for lower body dressing: Moderate Assistance - Patient 50 - 74%     Toileting Toileting    Toileting assist Assist for toileting: Moderate Assistance - Patient 50 - 74%  Transfers Chair/bed transfer  Transfers assist  Chair/bed transfer activity did not occur: Safety/medical concerns  Chair/bed transfer assist level: Minimal Assistance - Patient > 75%     Locomotion Ambulation   Ambulation assist   Ambulation activity did not occur: Safety/medical concerns  Assist level: Minimal Assistance - Patient > 75% Assistive device: Walker-rolling Max distance: 70'   Walk 10 feet  activity   Assist  Walk 10 feet activity did not occur: Safety/medical concerns  Assist level: Minimal Assistance - Patient > 75% Assistive device: Walker-rolling   Walk 50 feet activity   Assist Walk 50 feet with 2 turns activity did not occur: Safety/medical concerns  Assist level: Minimal Assistance - Patient > 75% Assistive device: Walker-rolling    Walk 150 feet activity   Assist Walk 150 feet activity did not occur: Safety/medical concerns         Walk 10 feet on uneven surface  activity   Assist Walk 10 feet on uneven surfaces activity did not occur: Safety/medical concerns         Wheelchair     Assist Will patient use wheelchair at discharge?: No             Wheelchair 50 feet with 2 turns activity    Assist            Wheelchair 150 feet activity     Assist      Assist Level: Independent   BP (!) 155/68   Pulse 90   Temp (!) 97.4 F (36.3 C) (Tympanic)   Resp 17   Ht 5\' 7"  (1.702 m)   Wt 85 kg   SpO2 98%   BMI 29.35 kg/m   Medical Problem List and Plan: 1.  Impaired mobility and ADLs secondary to acute CVA with left midbrain infarct  Continue CIR PT, OT, SLP   ELOS 12/15 2.  Antithrombotics: -DVT/anticoagulation:  Pharmaceutical: Lovenox (hold)  -check dopplers             -antiplatelet therapy: Aspirin 3. Pain Management: Tylenol prn.   Gabapentin 100 3 times daily started 11/26 for?  Neuropathic pain post stroke, decreased to nightly on 11/28 due to? Side effects, with improvement  Right wrist pain-x-ray shows OA, no acute changes, uric acid ordered and is normal   Empiric prednisone started for gout  12/8 gout pain flared with reduction to 5mg , continue at 10mg  for now   -pain improved at 10mg  dose   -UA level normal  12/10 reduced prednisone to 7.5mg  today---continue thru weekend 4. Mood: Continue Celexa 20mg  daily for depression 5. Neuropsych: This patient is not fully capable of making decisions on his  own behalf. 6. Skin/Wound Care: ZioPatch in place, dry skin- added Eucerin cream 7. Fluids/Electrolytes/Nutrition: Monitor I/Os. Encourage fluid intake  8. HTN: Monitor BP   Decrease Amlodipine to 5mg , DC'd on 11/28.   Continue Lisinopril 40mg  daily, decreased to 10 on 11/28  Generally has been controlled. Resuming 2.5 norvasc d/t HR    9. Tobacco abuse: Provide counseling  10. Tachycardia/beats of NSVT: Zio patch in place.   HR elevated intermittently with therapy   -resume norvasc 2.5mg  12/10   -encouraging fluids   -rx anemia   -if elevations are persistent, may re-consult cards 11. AKI  Has received intermitent IVF  12/10: BUN/Cr: 28/1.29--probably not too far off his baseline 12. ABLA  Hb 11.2 on 11/27, 9.6 on 11/29:     further drop to 9.2 on 12/2. stool OB+ today 12/5  -  hgb up to 9.5 on 12/6, stool OB+ x 2  -12/10 s/p EGD, colonoscopy today by Dr. Fuller Plan   -EGD: mild gastritis, duodenal polyps   -Col: limited d/t retained stool, non-bleeding internal hemorrhoids seen, otherwise normal in the limited view obtained   -repeat colonoscopy in 3 mos   -continue PPI, lovenox to remain on hold 13.  Leukocytosis steroids plus UTI   WBCs 18.5 12/6--18.8 12/8  UA equivocal, UC returned positive for enterococcus faecalis sensitive to nitrofurantoin- ordered.   Recent CXR normal but crackles on right  Remains Afebrile  CXR 12/8 is clear  -Incentive spirometry  -likely steroid related  -12/10 repeat UA negative, ucx with 20k again with enterococcus   -dc macrobid and try 3 days amoxil instead   -wbc's 17k today 14.  Hyponatremia  Sodium 137 12/10 15.  Post stroke dysphagia  D2 thins  Advance diet as tolerated per SLP      LOS: 16 days A FACE TO FACE EVALUATION WAS PERFORMED  Meredith Staggers 07/13/2020, 12:36 PM

## 2020-07-14 ENCOUNTER — Encounter (HOSPITAL_COMMUNITY): Payer: Medicare Other

## 2020-07-14 ENCOUNTER — Ambulatory Visit (HOSPITAL_COMMUNITY): Payer: Medicare Other | Admitting: Physical Therapy

## 2020-07-14 NOTE — Anesthesia Postprocedure Evaluation (Signed)
Anesthesia Post Note  Patient: Andres White  Procedure(s) Performed: COLONOSCOPY WITH PROPOFOL (N/A ) ESOPHAGOGASTRODUODENOSCOPY (EGD) (N/A ) POLYPECTOMY BIOPSY     Patient location during evaluation: Endoscopy Anesthesia Type: MAC Level of consciousness: awake Pain management: pain level controlled Vital Signs Assessment: post-procedure vital signs reviewed and stable Respiratory status: spontaneous breathing, nonlabored ventilation, respiratory function stable and patient connected to nasal cannula oxygen Cardiovascular status: stable and blood pressure returned to baseline Postop Assessment: no apparent nausea or vomiting Anesthetic complications: no   No complications documented.  Last Vitals:  Vitals:   07/13/20 2045 07/14/20 0420  BP:  105/63  Pulse:  70  Resp: 20 20  Temp:  36.4 C  SpO2:  99%    Last Pain:  Vitals:   07/14/20 0420  TempSrc: Oral  PainSc: 0-No pain                 Parissa Chiao P Noelene Gang

## 2020-07-14 NOTE — Progress Notes (Signed)
Occupational Therapy Session Note  Patient Details  Name: Andres White MRN: [id number] Date of Birth: November 21, 1945  Today's Date: 07/14/2020 OT Individual Time: 1000-1044 OT Individual Time Calculation (min): 44 min    Short Term Goals: Week 1:  OT Short Term Goal 1 (Week 1): Pt will complete sit<>stand with max A of 1 in preparation for BADL tasks OT Short Term Goal 1 - Progress (Week 1): Met OT Short Term Goal 2 (Week 1): Pt will complete toilet transfer with max A OT Short Term Goal 2 - Progress (Week 1): Met OT Short Term Goal 3 (Week 1): Pt will perform LB dressing with mod A OT Short Term Goal 3 - Progress (Week 1): Met OT Short Term Goal 4 (Week 1): Pt will perform UB dressing with min A OT Short Term Goal 4 - Progress (Week 1): Met  Skilled Therapeutic Interventions/Progress Updates:    1:1. Pt received in bed. No family present for family education. Daughter was present yesterday. Pt completes dressing at EOB with CGA to pull second shirt down back and threads BLE into pants iwith increased time seated EOB and VC for pulling pants up from the back. Pt requires MAX A to stand from EOB with RW but once powered up can stand with CGA while OT pulls pants past hips. Pt completes pivot steps to chair with MIN A. Pt completes 3STS at table wit VC for positioning feet and hands with MAX decreasing to MOD A over trials. Exited session with pt seated in w/c, exit alarm on and call light in reach. .  Therapy Documentation Precautions:  Precautions Precautions: Fall Precaution Comments: watch HR Restrictions Weight Bearing Restrictions: No General:   Vital Signs: Therapy Vitals BP: 110/70 Pain:   ADL: ADL Eating: Maximal assistance (with R hand) Grooming: Moderate assistance (with R hand) Upper Body Bathing: Moderate assistance Lower Body Bathing: Maximal assistance Upper Body Dressing: Moderate assistance Lower Body Dressing: Maximal assistance,Dependent Toileting: Unable  to assess Toilet Transfer: Unable to assess Vision   Perception    Praxis   Exercises:   Other Treatments:     Therapy/Group: Individual Therapy  Tonny Branch 07/14/2020, 10:33 AM

## 2020-07-14 NOTE — Progress Notes (Signed)
Physical Therapy Session Note  Patient Details  Name: Andres White MRN: [id number] Date of Birth: 01/05/1946  Today's Date: 07/14/2020 PT Individual Time: 1130-1155 PT Individual Time Calculation (min): 25 min   Short Term Goals:  Week 3:  PT Short Term Goal 1 (Week 3): STGs=LTGs  Skilled Therapeutic Interventions/Progress Updates:   Pt received sitting in WC and agreeable to PT. No family present for scheduled education session. Pt transported to rehab gym in [facility]. Sit<>stand with min assist after 2 tries and UE support on RW. Pt attempted to perform gait training x 74f, but unable to continue due to severe pain in the R knee, but did not rate.  Seated LE therex:  LAQ with AAROM on the L. X 10 Hip abduction with level 2 tband x10 Ankle PF x 20 level 1 tband.  HS curl x 10 level 1 tband  Hip extension x 10 with level 1 tband  Reciprocal march x 12 with level 1 tband.   Pt returned to room and performed stand pivot transfer to bed with RW and min assist. Sit>supine completed with min assist for safety, and left supine in bed with call bell in reach and all needs met.       Therapy Documentation Precautions:  Precautions Precautions: Fall Precaution Comments: watch HR Restrictions Weight Bearing Restrictions: No    Vital Signs: Therapy Vitals BP: 110/70 Pain:   see above Therapy/Group: Individual Therapy  ALorie Phenix12/06/2020, 11:53 AM

## 2020-07-14 NOTE — Progress Notes (Signed)
[place] PHYSICAL MEDICINE & REHABILITATION PROGRESS NOTE  Subjective/Complaints: No complaints this morning BP soft Other VSS  ROS: Patient denies fever, rash, sore throat, blurred vision, nausea, vomiting, diarrhea, cough,  chest pain, joint or back pain, headache, or mood change.     Objective: Vital Signs: Blood pressure 106/63, pulse 82, temperature 99.3 F (37.4 C), resp. rate 16, height 5\' 7"  (1.702 m), weight 85 kg, SpO2 99 %. No results found. Recent Labs    07/12/20 0303 07/13/20 0412  WBC 18.5* 17.5*  HGB 8.6* 8.7*  HCT 26.8* 27.5*  PLT 330 326   Recent Labs    07/13/20 0412  NA 137  K 4.9  CL 102  CO2 25  GLUCOSE 103*  BUN 28*  CREATININE 1.29*  CALCIUM 9.0    Intake/Output Summary (Last 24 hours) at 07/14/2020 1615 Last data filed at 07/14/2020 1300 Gross per 24 hour  Intake 718 ml  Output 600 ml  Net 118 ml        Physical Exam: BP 106/63 (BP Location: Left Arm)   Pulse 82   Temp 99.3 F (37.4 C)   Resp 16   Ht 5\' 7"  (1.702 m)   Wt 85 kg   SpO2 99%   BMI 29.35 kg/m  Gen: no distress, normal appearing HEENT: oral mucosa pink and moist, NCAT Cardio: Reg rate Chest: normal effort, normal rate of breathing Abd: soft, non-distended Ext: no edema Musculoskeletal: Right foot pain resolved, 1st MTP swelling as well as R ankle swelling is minimal, pain with R knee ROM no sig effusion, also with tenderness R olecranon bursa, small bursal fluid collection  dysarthric Motor: LUE/LLE: 5/5 proximal to distal RUE:   3+/5 proximal to distal with ataxia    RLE:  3- to3 /5 proximal to distal  Assessment/Plan: 1. Functional deficits which require 3+ hours per day of interdisciplinary therapy in a comprehensive inpatient rehab setting.  Physiatrist is providing close team supervision and 24 hour management of active medical problems listed below.  Physiatrist and rehab team continue to assess barriers to discharge/monitor patient progress toward  functional and medical goals   Care Tool:  Bathing    Body parts bathed by patient: Right arm,Left arm,Chest,Abdomen,Front perineal area,Buttocks,Face,Left lower leg,Right lower leg,Left upper leg,Right upper leg   Body parts bathed by helper: Right lower leg,Left lower leg,Buttocks,Left arm     Bathing assist Assist Level: Minimal Assistance - Patient > 75%     Upper Body Dressing/Undressing Upper body dressing   What is the patient wearing?: Pull over shirt    Upper body assist Assist Level: Contact Guard/Touching assist    Lower Body Dressing/Undressing Lower body dressing      What is the patient wearing?: Pants     Lower body assist Assist for lower body dressing: Minimal Assistance - Patient > 75%     Toileting Toileting    Toileting assist Assist for toileting: Minimal Assistance - Patient > 75%     Transfers Chair/bed transfer  Transfers assist  Chair/bed transfer activity did not occur: Safety/medical concerns  Chair/bed transfer assist level: Minimal Assistance - Patient > 75%     Locomotion Ambulation   Ambulation assist   Ambulation activity did not occur: Safety/medical concerns  Assist level: Minimal Assistance - Patient > 75% Assistive device: Walker-rolling Max distance: 115'   Walk 10 feet activity   Assist  Walk 10 feet activity did not occur: Safety/medical concerns  Assist level: Minimal Assistance - Patient >  75% Assistive device: Walker-rolling   Walk 50 feet activity   Assist Walk 50 feet with 2 turns activity did not occur: Safety/medical concerns  Assist level: Minimal Assistance - Patient > 75% Assistive device: Walker-rolling    Walk 150 feet activity   Assist Walk 150 feet activity did not occur: Safety/medical concerns         Walk 10 feet on uneven surface  activity   Assist Walk 10 feet on uneven surfaces activity did not occur: Safety/medical concerns         Wheelchair     Assist Will  patient use wheelchair at discharge?: No             Wheelchair 50 feet with 2 turns activity    Assist            Wheelchair 150 feet activity     Assist      Assist Level: Independent   BP 106/63 (BP Location: Left Arm)   Pulse 82   Temp 99.3 F (37.4 C)   Resp 16   Ht 5\' 7"  (1.702 m)   Wt 85 kg   SpO2 99%   BMI 29.35 kg/m   Medical Problem List and Plan: 1.  Impaired mobility and ADLs secondary to acute CVA with left midbrain infarct  Continue CIR PT, OT, SLP   ELOS 12/15 2.  Antithrombotics: -DVT/anticoagulation:  Pharmaceutical: Lovenox (hold)  -check dopplers             -antiplatelet therapy: Aspirin 3. Pain Management: Tylenol prn.   Gabapentin 100 3 times daily started 11/26 for?  Neuropathic pain post stroke, decreased to nightly on 11/28 due to? Side effects, with improvement  Right wrist pain-x-ray shows OA, no acute changes, uric acid ordered and is normal   Empiric prednisone started for gout  12/8 gout pain flared with reduction to 5mg , continue at 10mg  for now   -pain improved at 10mg  dose   -UA level normal  12/10 reduced prednisone to 7.5mg  today---continue thru weekend 4. Mood: Continue Celexa 20mg  daily for depression 5. Neuropsych: This patient is not fully capable of making decisions on his own behalf. 6. Skin/Wound Care: ZioPatch in place, dry skin- added Eucerin cream 7. Fluids/Electrolytes/Nutrition: Monitor I/Os. Encourage fluid intake  8. HTN: Monitor BP   Decrease Amlodipine to 5mg , DC'd on 11/28.   Continue Lisinopril 40mg  daily, decreased to 10 on 11/28  Generally has been controlled. Resuming 2.5 norvasc d/t HR   12/11: BP and HR well controlled- continue current regimen.  9. Tobacco abuse: Provide counseling  10. Tachycardia/beats of NSVT: Zio patch in place.   HR elevated intermittently with therapy   -resume norvasc 2.5mg  12/10   -encouraging fluids   -rx anemia   -if elevations are persistent, may re-consult  cards 11. AKI  Has received intermitent IVF  12/10: BUN/Cr: 28/1.29--probably not too far off his baseline 12. ABLA  Hb 11.2 on 11/27, 9.6 on 11/29:     further drop to 9.2 on 12/2. stool OB+ today 12/5  -hgb up to 9.5 on 12/6, stool OB+ x 2  -12/10 s/p EGD, colonoscopy today by Dr. Fuller Plan   -EGD: mild gastritis, duodenal polyps   -Col: limited d/t retained stool, non-bleeding internal hemorrhoids seen, otherwise normal in the limited view obtained   -repeat colonoscopy in 3 mos   -continue PPI, lovenox to remain on hold 13.  Leukocytosis steroids plus UTI   WBCs 18.5 12/6--18.8 12/8  UA equivocal, UC  returned positive for enterococcus faecalis sensitive to nitrofurantoin- ordered.   Recent CXR normal but crackles on right  Remains Afebrile  CXR 12/8 is clear  -Incentive spirometry  -likely steroid related  -12/10 repeat UA negative, ucx with 20k again with enterococcus   -dc macrobid and try 3 days amoxil instead   -wbc's 17k 12/10: repeat weekly.  14.  Hyponatremia  Sodium 137 12/10 15.  Post stroke dysphagia  D2 thins  Advance diet as tolerated per SLP      LOS: 17 days A FACE TO FACE EVALUATION WAS PERFORMED  Martha Clan P Tamee Battin 07/14/2020, 4:15 PM

## 2020-07-14 NOTE — Progress Notes (Signed)
Speech Language Pathology Daily Session Note  Patient Details  Name: Andres White MRN: [id number] Date of Birth: 11-15-1945  Today's Date: 07/14/2020 SLP Individual Time: 1104-1130 SLP Individual Time Calculation (min): 26 min  Short Term Goals: Week 3: SLP Short Term Goal 1 (Week 3): STGs=LTGs due to ELOS  Skilled Therapeutic Interventions:Skilled ST services focused on cognitive skills. Pt's daughter was not present for planned education. SLP facilitated reading comprehensions, noted deficit by primary SLP.  Pt matched word to picture given three choices inconsistently on RCBA subsection. SLP created a list of alphabetic letters out of sequential order, pt was able to identify 16 out 26 letters. Pt demonstrated more naming errors on second trial, however a greater ability to self-correct. Pt appeared to misidentify letters that were similar in structure, however this was not always consistent. SLP created an alphabetical list in order and instructed pt to practice naming letter in a familiar pattern. Pt was left in room with call bell within reach and chair alarm set. SLP recommends to continue skilled services.     Pain Pain Assessment Pain Score: 0-No pain  Therapy/Group: Individual Therapy  Andres White  [facility] 07/14/2020, 3:11 PM

## 2020-07-15 ENCOUNTER — Encounter (HOSPITAL_COMMUNITY): Payer: Self-pay | Admitting: Gastroenterology

## 2020-07-15 NOTE — Progress Notes (Signed)
[place] PHYSICAL MEDICINE & REHABILITATION PROGRESS NOTE  Subjective/Complaints: No complaints BP soft  ROS: Patient denies fever, rash, sore throat, blurred vision, nausea, vomiting, diarrhea, cough,  chest pain, joint or back pain, headache, or mood change.     Objective: Vital Signs: Blood pressure (!) 96/54, pulse 91, temperature 100 F (37.8 C), temperature source Oral, resp. rate 20, height 5\' 7"  (1.702 m), weight 85 kg, SpO2 98 %. No results found. Recent Labs    07/13/20 0412  WBC 17.5*  HGB 8.7*  HCT 27.5*  PLT 326   Recent Labs    07/13/20 0412  NA 137  K 4.9  CL 102  CO2 25  GLUCOSE 103*  BUN 28*  CREATININE 1.29*  CALCIUM 9.0    Intake/Output Summary (Last 24 hours) at 07/15/2020 1502 Last data filed at 07/15/2020 1300 Gross per 24 hour  Intake 560 ml  Output 1230 ml  Net -670 ml        Physical Exam: BP (!) 96/54 (BP Location: Left Arm)   Pulse 91   Temp 100 F (37.8 C) (Oral)   Resp 20   Ht 5\' 7"  (1.702 m)   Wt 85 kg   SpO2 98%   BMI 29.35 kg/m  Gen: no distress, normal appearing HEENT: oral mucosa pink and moist, NCAT Cardio: Reg rate Chest: normal effort, normal rate of breathing Abd: soft, non-distended Ext: no edema Musculoskeletal: Right foot pain resolved, 1st MTP swelling as well as R ankle swelling is minimal, pain with R knee ROM no sig effusion, also with tenderness R olecranon bursa, small bursal fluid collection  dysarthric Motor: LUE/LLE: 5/5 proximal to distal RUE:   3+/5 proximal to distal with ataxia    RLE:  3- to3 /5 proximal to distal  Assessment/Plan: 1. Functional deficits which require 3+ hours per day of interdisciplinary therapy in a comprehensive inpatient rehab setting.  Physiatrist is providing close team supervision and 24 hour management of active medical problems listed below.  Physiatrist and rehab team continue to assess barriers to discharge/monitor patient progress toward functional and  medical goals   Care Tool:  Bathing    Body parts bathed by patient: Right arm,Left arm,Chest,Abdomen,Front perineal area,Buttocks,Face,Left lower leg,Right lower leg,Left upper leg,Right upper leg   Body parts bathed by helper: Right lower leg,Left lower leg,Buttocks,Left arm     Bathing assist Assist Level: Minimal Assistance - Patient > 75%     Upper Body Dressing/Undressing Upper body dressing   What is the patient wearing?: Pull over shirt    Upper body assist Assist Level: Contact Guard/Touching assist    Lower Body Dressing/Undressing Lower body dressing      What is the patient wearing?: Pants     Lower body assist Assist for lower body dressing: Minimal Assistance - Patient > 75%     Toileting Toileting    Toileting assist Assist for toileting: Minimal Assistance - Patient > 75%     Transfers Chair/bed transfer  Transfers assist  Chair/bed transfer activity did not occur: Safety/medical concerns  Chair/bed transfer assist level: Minimal Assistance - Patient > 75%     Locomotion Ambulation   Ambulation assist   Ambulation activity did not occur: Safety/medical concerns  Assist level: Minimal Assistance - Patient > 75% Assistive device: Walker-rolling Max distance: 115'   Walk 10 feet activity   Assist  Walk 10 feet activity did not occur: Safety/medical concerns  Assist level: Minimal Assistance - Patient > 75% Assistive device: Walker-rolling  Walk 50 feet activity   Assist Walk 50 feet with 2 turns activity did not occur: Safety/medical concerns  Assist level: Minimal Assistance - Patient > 75% Assistive device: Walker-rolling    Walk 150 feet activity   Assist Walk 150 feet activity did not occur: Safety/medical concerns         Walk 10 feet on uneven surface  activity   Assist Walk 10 feet on uneven surfaces activity did not occur: Safety/medical concerns         Wheelchair     Assist Will patient use  wheelchair at discharge?: No             Wheelchair 50 feet with 2 turns activity    Assist            Wheelchair 150 feet activity     Assist      Assist Level: Independent   BP (!) 96/54 (BP Location: Left Arm)   Pulse 91   Temp 100 F (37.8 C) (Oral)   Resp 20   Ht 5\' 7"  (1.702 m)   Wt 85 kg   SpO2 98%   BMI 29.35 kg/m   Medical Problem List and Plan: 1.  Impaired mobility and ADLs secondary to acute CVA with left midbrain infarct  Continue CIR PT, OT, SLP   ELOS 12/15 2.  Antithrombotics: -DVT/anticoagulation:  Pharmaceutical: Lovenox (hold)  -check dopplers             -antiplatelet therapy: Continue Aspirin 81mg  daily 3. Pain Management: Tylenol prn.   Gabapentin 100 3 times daily started 11/26 for?  Neuropathic pain post stroke, decreased to nightly on 11/28 due to? Side effects, with improvement  Right wrist pain-x-ray shows OA, no acute changes, uric acid ordered and is normal   Empiric prednisone started for gout  12/8 gout pain flared with reduction to 5mg , continue at 10mg  for now   -pain improved at 10mg  dose   -UA level normal  12/10 reduced prednisone to 7.5mg  today---continue thru weekend 4. Mood: Continue Celexa 20mg  daily for depression 5. Neuropsych: This patient is not fully capable of making decisions on his own behalf. 6. Skin/Wound Care: ZioPatch in place, dry skin- added Eucerin cream 7. Fluids/Electrolytes/Nutrition: Monitor I/Os. Encourage fluid intake  8. HTN: Monitor BP   Decrease Amlodipine to 5mg , DC'd on 11/28.   Continue Lisinopril 40mg  daily, decreased to 10 on 11/28  Generally has been controlled. Resuming 2.5 norvasc d/t HR   12/12: Bp soft to 96/54: stop amlodipine.   9. Tobacco abuse: Provide counseling  10. Tachycardia/beats of NSVT: Zio patch in place.   HR elevated intermittently with therapy   -resume norvasc 2.5mg  12/10   -encouraging fluids   -rx anemia   -if elevations are persistent, may re-consult  cards 11. AKI  Has received intermitent IVF  12/10: BUN/Cr: 28/1.29--probably not too far off his baseline 12. ABLA  Hb 11.2 on 11/27, 9.6 on 11/29:     further drop to 9.2 on 12/2. stool OB+ today 12/5  -hgb up to 9.5 on 12/6, stool OB+ x 2  -12/10 s/p EGD, colonoscopy today by Dr. Fuller Plan   -EGD: mild gastritis, duodenal polyps   -Col: limited d/t retained stool, non-bleeding internal hemorrhoids seen, otherwise normal in the limited view obtained   -repeat colonoscopy in 3 mos   -continue PPI, lovenox to remain on hold 13.  Leukocytosis steroids plus UTI   WBCs 18.5 12/6--18.8 12/8  UA equivocal, UC returned positive  for enterococcus faecalis sensitive to nitrofurantoin- ordered.   Recent CXR normal but crackles on right  Remains Afebrile  CXR 12/8 is clear  -Incentive spirometry  -likely steroid related  -12/10 repeat UA negative, ucx with 20k again with enterococcus   -dc macrobid and try 3 days amoxil instead   -wbc's 17k 12/10: repeat weekly.  14.  Hyponatremia  Sodium 137 12/10 15.  Post stroke dysphagia  D2 thins  Advance diet as tolerated per SLP 16. HLD: continue lipitor 40mg  daily      LOS: 18 days A FACE TO FACE EVALUATION WAS PERFORMED  Varsha Knock P Wolfgang Finigan 07/15/2020, 3:02 PM

## 2020-07-16 ENCOUNTER — Inpatient Hospital Stay (HOSPITAL_COMMUNITY): Payer: Medicare Other

## 2020-07-16 ENCOUNTER — Inpatient Hospital Stay (HOSPITAL_COMMUNITY): Payer: Medicare Other | Admitting: Occupational Therapy

## 2020-07-16 ENCOUNTER — Other Ambulatory Visit: Payer: Self-pay | Admitting: Physician Assistant

## 2020-07-16 LAB — URINE CULTURE: Culture: 20000 — AB

## 2020-07-16 LAB — BASIC METABOLIC PANEL
Anion gap: 8 (ref 5–15)
BUN: 33 mg/dL — ABNORMAL HIGH (ref 8–23)
CO2: 24 mmol/L (ref 22–32)
Calcium: 8.7 mg/dL — ABNORMAL LOW (ref 8.9–10.3)
Chloride: 100 mmol/L (ref 98–111)
Creatinine, Ser: 1.47 mg/dL — ABNORMAL HIGH (ref 0.61–1.24)
GFR, Estimated: 49 mL/min — ABNORMAL LOW (ref >60)
Glucose, Bld: 127 mg/dL — ABNORMAL HIGH (ref 70–99)
Potassium: 4.1 mmol/L (ref 3.5–5.1)
Sodium: 132 mmol/L — ABNORMAL LOW (ref 135–145)

## 2020-07-16 MED ORDER — PREDNISONE 5 MG PO TABS
5.0000 mg | ORAL_TABLET | Freq: Every day | ORAL | Status: DC
  Administered 2020-07-16 – 2020-07-17 (×2): 5 mg via ORAL
  Filled 2020-07-16: qty 1

## 2020-07-16 NOTE — Progress Notes (Signed)
Patient ID: BOYCE KELTNER, male   DOB: 1945/11/30, 74 y.o.   MRN: [id number]  SW waiting on updates from [facility] on if able to accept referral.   SW confirms DME present: w/c, TTB, and 3in1 BSC.  *SW received updates that referral was declined by [facility].   SW sent HHPT/OT/SLP/aide referral to Teresa/[facility]. SW waiting on follow-up.  *Referral declined.  SW sent HHPT/OT/SLP/aide referral to Cory/[facility]. SW waiting on follow-up.  *referral declined.   SW sent HHPT/OT/SLP/aide referral to Amy/[facility]. SW waiting on follow-up.   Loralee Pacas, MSW, [place] Office: [phone] Cell: [phone] Fax: [phone]

## 2020-07-16 NOTE — Progress Notes (Signed)
[place] PHYSICAL MEDICINE & REHABILITATION PROGRESS NOTE  Subjective/Complaints: Pt is feeling well today. Feet,joints don't hurt, moving bowels.   ROS: Patient denies fever, rash, sore throat, blurred vision, nausea, vomiting, diarrhea, cough, shortness of breath or chest pain, joint or back pain, headache, or mood change.   Objective: Vital Signs: Blood pressure 104/61, pulse 73, temperature 98.5 F (36.9 C), resp. rate 18, height 5\' 7"  (1.702 m), weight 85 kg, SpO2 97 %. No results found. No results for input(s): WBC, HGB, HCT, PLT in the last 72 hours. Recent Labs    07/16/20 0511  NA 132*  K 4.1  CL 100  CO2 24  GLUCOSE 127*  BUN 33*  CREATININE 1.47*  CALCIUM 8.7*    Intake/Output Summary (Last 24 hours) at 07/16/2020 1145 Last data filed at 07/16/2020 0700 Gross per 24 hour  Intake 600 ml  Output 950 ml  Net -350 ml        Physical Exam: BP 104/61 (BP Location: Left Arm)   Pulse 73   Temp 98.5 F (36.9 C)   Resp 18   Ht 5\' 7"  (1.702 m)   Wt 85 kg   SpO2 97%   BMI 29.35 kg/m  Constitutional: No distress . Vital signs reviewed. HEENT: EOMI, oral membranes moist Neck: supple Cardiovascular: RRR without murmur. No JVD    Respiratory/Chest: CTA Bilaterally without wheezes or rales. Normal effort    GI/Abdomen: BS +, non-tender, Sl-distended Ext: no clubbing, cyanosis, or edema Psych: pleasant and cooperative Musculoskeletal: Right foot pain resolved,still some sl swelling in right foot,ankle.  R olecranon bursa, small bursal fluid collection  dysarthric Motor: LUE/LLE: 5/5 proximal to distal RUE:   3+/5 proximal to distal--no change    RLE:  3- to3 /5 proximal to distal  Assessment/Plan: 1. Functional deficits which require 3+ hours per day of interdisciplinary therapy in a comprehensive inpatient rehab setting.  Physiatrist is providing close team supervision and 24 hour management of active medical problems listed below.  Physiatrist and rehab  team continue to assess barriers to discharge/monitor patient progress toward functional and medical goals   Care Tool:  Bathing    Body parts bathed by patient: Right arm,Left arm,Chest,Abdomen,Front perineal area,Buttocks,Face,Left lower leg,Right lower leg,Left upper leg,Right upper leg   Body parts bathed by helper: Right lower leg,Left lower leg,Buttocks,Left arm     Bathing assist Assist Level: Minimal Assistance - Patient > 75%     Upper Body Dressing/Undressing Upper body dressing   What is the patient wearing?: Pull over shirt    Upper body assist Assist Level: Contact Guard/Touching assist    Lower Body Dressing/Undressing Lower body dressing      What is the patient wearing?: Pants     Lower body assist Assist for lower body dressing: Minimal Assistance - Patient > 75%     Toileting Toileting    Toileting assist Assist for toileting: Minimal Assistance - Patient > 75%     Transfers Chair/bed transfer  Transfers assist  Chair/bed transfer activity did not occur: Safety/medical concerns  Chair/bed transfer assist level: Minimal Assistance - Patient > 75%     Locomotion Ambulation   Ambulation assist   Ambulation activity did not occur: Safety/medical concerns  Assist level: Minimal Assistance - Patient > 75% Assistive device: Walker-rolling Max distance: 115'   Walk 10 feet activity   Assist  Walk 10 feet activity did not occur: Safety/medical concerns  Assist level: Minimal Assistance - Patient > 75% Assistive device: Walker-rolling  Walk 50 feet activity   Assist Walk 50 feet with 2 turns activity did not occur: Safety/medical concerns  Assist level: Minimal Assistance - Patient > 75% Assistive device: Walker-rolling    Walk 150 feet activity   Assist Walk 150 feet activity did not occur: Safety/medical concerns         Walk 10 feet on uneven surface  activity   Assist Walk 10 feet on uneven surfaces activity did not  occur: Safety/medical concerns         Wheelchair     Assist Will patient use wheelchair at discharge?: No             Wheelchair 50 feet with 2 turns activity    Assist            Wheelchair 150 feet activity     Assist      Assist Level: Independent   BP 104/61 (BP Location: Left Arm)   Pulse 73   Temp 98.5 F (36.9 C)   Resp 18   Ht 5\' 7"  (1.702 m)   Wt 85 kg   SpO2 97%   BMI 29.35 kg/m   Medical Problem List and Plan: 1.  Impaired mobility and ADLs secondary to acute CVA with left midbrain infarct  Continue CIR PT, OT, SLP   ELOS 12/15 2.  Antithrombotics: -DVT/anticoagulation:  Pharmaceutical: Lovenox (hold)  -dopplers are ok             -antiplatelet therapy: Continue Aspirin 81mg  daily 3. Pain Management: Tylenol prn.   Gabapentin 100 3 times daily started 11/26 for?  Neuropathic pain post stroke, decreased to nightly on 11/28 due to? Side effects, with improvement  Right wrist pain-x-ray shows OA, no acute changes, uric acid ordered and is normal   Empiric prednisone started for gout  12/8 gout pain flared with reduction to 5mg , continue at 10mg  for now   -pain improved at 10mg  dose   -UA level normal  12/13 tolerated 7.5 prednisone without issue. Reduce to 5mg  daily today 4. Mood: Continue Celexa 20mg  daily for depression 5. Neuropsych: This patient is not fully capable of making decisions on his own behalf. 6. Skin/Wound Care: ZioPatch in place, dry skin- added Eucerin cream 7. Fluids/Electrolytes/Nutrition: Monitor I/Os. Encourage fluid intake  8. HTN: Monitor BP   Decrease Amlodipine to 5mg , DC'd on 11/28.   Continue Lisinopril 40mg  daily, decreased to 10 on 11/28  Generally has been controlled. Resuming 2.5 norvasc d/t HR   12/13: norvasc stopped d/t hypotension again, HR holding  9. Tobacco abuse: Provide counseling  10. Tachycardia/beats of NSVT: Zio patch in place.   HR elevated intermittently with therapy   -resume  norvasc 2.5mg  12/10   -encouraging fluids   -rx anemia   -if elevations are persistent, may re-consult cards 11. AKI  Has received intermitent IVF  12/10: BUN/Cr: 28/1.29--probably not too far off his baseline  12/13: BUN/Cr 33/1.47 12. ABLA  Hb 11.2 on 11/27, 9.6 on 11/29:     further drop to 9.2 on 12/2. stool OB+ today 12/5  -hgb up to 9.5 on 12/6, stool OB+ x 2  -12/10 s/p EGD, colonoscopy today by Dr. Fuller Plan   -EGD: mild gastritis, duodenal polyps   -Col: limited d/t retained stool, non-bleeding internal hemorrhoids seen, otherwise normal in the limited view obtained   -repeat colonoscopy in 3 mos   -continue PPI, lovenox to remain on hold 13.  Leukocytosis steroids plus UTI   WBCs 18.5 12/6--18.8 12/8  UA equivocal, UC returned positive for enterococcus faecalis sensitive to nitrofurantoin- ordered.   Recent CXR normal but crackles on right  Remains Afebrile  CXR 12/8 is clear  -Incentive spirometry  -likely steroid related  -12/10 repeat UA negative, ucx with 20k again with enterococcus   -dc macrobid and try 3 days amoxil instead   -wbc's 17k 12/10: repeat weekly.  14.  Hyponatremia  Sodium 132 12/13 15.  Post stroke dysphagia  D2 thins  Advance diet as tolerated per SLP 16. HLD: continue lipitor 40mg  daily      LOS: 19 days [place] 07/16/2020, 11:45 AM

## 2020-07-16 NOTE — Progress Notes (Signed)
Physical Therapy Session Note  Patient Details  Name: Andres White MRN: [id number] Date of Birth: 06/11/1946  Today's Date: 07/16/2020 PT Individual Time: 1500-1600 PT Individual Time Calculation (min): 60 min   Short Term Goals: Week 3:  PT Short Term Goal 1 (Week 3): STGs=LTGs  Skilled Therapeutic Interventions/Progress Updates:     Pt received seated in WC and agrees to therapy. Reports soreness in both feet, which has been a consistent complaint of pt throughout rehab stay, but today apparently is worse than normal. Number not provided. PT alerts RN to pain following session. WC transport to gym for time management. Session intended to focus on stair training but pt unable to complete sit to stand transfer. PT attempts multiple strategies to complete sit to stand, but pt cannot even clear his buttocks from chair, with complaints of extreme pain in feet. PT palpates both feet and both are very tender to moderate pressure, L>R. Pt performs 1x5 LAQs with alternating lower extremities with level 1 theraband resistance. Pt left seated in WC with alarm intact and all needs within reach.  Therapy Documentation Precautions:  Precautions Precautions: Fall Precaution Comments: watch HR Restrictions Weight Bearing Restrictions: No   Therapy/Group: Individual Therapy  Breck Coons, PT, DPT 07/16/2020, 12:11 PM

## 2020-07-16 NOTE — Progress Notes (Signed)
Speech Language Pathology Daily Session Note  Patient Details  Name: Andres White MRN: [id number] Date of Birth: 05/02/1946  Today's Date: 07/16/2020 SLP Individual Time: 0810-0907 SLP Individual Time Calculation (min): 57 min  Short Term Goals: Week 3: SLP Short Term Goal 1 (Week 3): STGs=LTGs due to ELOS  Skilled Therapeutic Interventions:Skilled ST services focused on cognitive skills. SLP continued to facilitated assessment of reading deficits. Pt demonstrated ability to recognize 26 /26 letters when listed in sequential order, however immediately following this task could only recognize/decode 10 out 26. Pt also required max A verbal cues to identify the sound of letters A-F  As well as a word with that initial speech sound. Pt appears to demonstrate a reading deficit in phonetics/decoding verse visual processing deficit. However later in the session pt demonstrated difficulty recognizing shapes and noted  "dim"ness in left eye, so visual deficits are likely a factor as well. SLP administered cognitive linguistic assessment SLUMS, pt scored 4 out 30 (n=>20 for 5th grade education.) Pt reports novel deficits in reading with acute infarct compared to baseline deficits. SLP attempted to contact pt's daughter and left a voicemail pertaining to current deficits and need of assistance. Pt was left in room with call bell within reach and bed alarm set. SLP recommends to continue skilled services.     Pain Pain Assessment Pain Scale: 0-10 Pain Score: 0-No pain Pain Type: Acute pain Pain Location: Ankle Pain Orientation: Left Pain Descriptors / Indicators: Aching;Grimacing;Guarding Pain Onset: With Activity Pain Intervention(s): Repositioned;Rest  Therapy/Group: Individual Therapy  Andres White  [facility] 07/16/2020, 4:08 PM

## 2020-07-16 NOTE — Progress Notes (Signed)
Occupational Therapy Session Note  Patient Details  Name: Andres White MRN: [id number] Date of Birth: 1945/11/11  Today's Date: 07/16/2020 OT Individual Time: 1000-1100 OT Individual Time Calculation (min): 60 min    Short Term Goals: Week 2:  OT Short Term Goal 1 (Week 2): Pt will complete toilet transfer with min A OT Short Term Goal 1 - Progress (Week 2): Met OT Short Term Goal 2 (Week 2): Pt will complete 1/3 toileting steps OT Short Term Goal 2 - Progress (Week 2): Met OT Short Term Goal 3 (Week 2): Pt will maintain standing balance with min A within BADL task OT Short Term Goal 3 - Progress (Week 2): Progressing toward goal Week 3:  OT Short Term Goal 1 (Week 3): LTG=STG 2.2 ELOS  Skilled Therapeutic Interventions/Progress Updates:    Patient in bed, alert - states that he does not have pain but with poor tolerance to donning teds due to hypersensitivity.  Supine to sitting edge of bed with min A.  Tolerated unsupported sitting to complete face washing with set up, donning [place] shirt with set up, pants mod/max A.  Significant difficulty with sit to stand requiring increased time, elevated bed surface and max A to complete SPT with RW to w/c.  Attempted sit to stand again multiple times during session but he was unable to get to standing position.  Completed trunk control and UB/ LB AROM with cues.  He remained seated in w/c at close of session, seat belt alarm set and call bell in hand.    Therapy Documentation Precautions:  Precautions Precautions: Fall Precaution Comments: watch HR Restrictions Weight Bearing Restrictions: No   Therapy/Group: Individual Therapy  Carlos Levering 07/16/2020, 7:40 AM

## 2020-07-16 NOTE — Progress Notes (Signed)
Physical Therapy Session Note  Patient Details  Name: Andres White MRN: [id number] Date of Birth: 05/24/1946  Today's Date: 07/16/2020 PT Individual Time: 1500-1600 PT Individual Time Calculation (min): 60 min   Short Term Goals: Week 3:  PT Short Term Goal 1 (Week 3): STGs=LTGs  Skilled Therapeutic Interventions/Progress Updates:    Patient in supine asleep, but easily aroused.  Supine to sit increased time and assist for R LE guiding off bed.  Patient seated edge of bed several moments reports has not been able to walk today due to pain in his L foot.  Performed sit to stand after three attempts elevating height of bed each time.  Stand with RW to step to w/c max A and mod cues for safety with flexed posture. Patient in w/c propelled about 15-20' with S and increased time prior to reporting too fatigued to continue.  Patient seated in w/c on Kinetron at 40 cm/sec 2 x 2 minutes without c/o pain.  In parallel bars pt sit to stand max A and cues and able to take 2 steps forward and back.  Second trial stood with mod A in bars and two steps again forward and back.  Scooting transfer onto mat with min to mod A; max cues and demonstration for head/hips relationship.  Patient assisted in w/c to room.  Sit to stand to RW mod A with momentum and max cues for hand placement.  Stand step to bed max A with RW and uncontrolled descent onto bed.  Seated for ankle DF/PF on L and reports moving better than earlier today.  Sit to supine assist for LE's into bed.  Left with call bell and table in reach and bed alarm activated.   Therapy Documentation Precautions:  Precautions Precautions: Fall Precaution Comments: watch HR Restrictions Weight Bearing Restrictions: No Pain: Pain Assessment Pain Scale: 0-10 Pain Score: 8  Pain Type: Acute pain Pain Location: Ankle Pain Orientation: Left Pain Descriptors / Indicators: Aching;Grimacing;Guarding Pain Onset: With Activity Pain Intervention(s):  Repositioned;Rest       Therapy/Group: Individual Therapy  Reginia Naas  [place], PT 07/16/2020, 4:05 PM

## 2020-07-17 ENCOUNTER — Encounter: Payer: Self-pay | Admitting: Gastroenterology

## 2020-07-17 ENCOUNTER — Inpatient Hospital Stay (HOSPITAL_COMMUNITY): Payer: Medicare Other

## 2020-07-17 ENCOUNTER — Inpatient Hospital Stay (HOSPITAL_COMMUNITY): Payer: Medicare Other | Admitting: Speech Pathology

## 2020-07-17 ENCOUNTER — Inpatient Hospital Stay (HOSPITAL_COMMUNITY): Payer: Medicare Other | Admitting: Occupational Therapy

## 2020-07-17 LAB — CBC
HCT: 28.3 % — ABNORMAL LOW (ref 39.0–52.0)
Hemoglobin: 9 g/dL — ABNORMAL LOW (ref 13.0–17.0)
MCH: 30.1 pg (ref 26.0–34.0)
MCHC: 31.8 g/dL (ref 30.0–36.0)
MCV: 94.6 fL (ref 80.0–100.0)
Platelets: 285 10*3/uL (ref 150–400)
RBC: 2.99 MIL/uL — ABNORMAL LOW (ref 4.22–5.81)
RDW: 13.4 % (ref 11.5–15.5)
WBC: 12.1 10*3/uL — ABNORMAL HIGH (ref 4.0–10.5)
nRBC: 0 % (ref 0.0–0.2)

## 2020-07-17 LAB — SURGICAL PATHOLOGY

## 2020-07-17 MED ORDER — PREDNISONE 5 MG PO TABS
10.0000 mg | ORAL_TABLET | Freq: Every day | ORAL | Status: DC
  Administered 2020-07-18: 10 mg via ORAL
  Filled 2020-07-17: qty 2

## 2020-07-17 MED ORDER — PREDNISONE 5 MG PO TABS
5.0000 mg | ORAL_TABLET | Freq: Once | ORAL | Status: AC
  Administered 2020-07-17: 5 mg via ORAL
  Filled 2020-07-17: qty 1

## 2020-07-17 NOTE — Patient Care Conference (Signed)
Inpatient RehabilitationTeam Conference and Plan of Care Update Date: 07/17/2020   Time: 10:52 AM    Patient Name: Andres White      Medical Record Number: [id number]  Date of Birth: Jul 04, 1946 Sex: Male         Room/Bed: 4W25C/4W25C-01 Payor Info: Payor: [employer] MEDICARE / Plan: [employer] MEDICARE / Product Type: *No Product type* /    Admit Date/Time:  06/27/2020  1:15 PM  Primary Diagnosis:  CVA (cerebral vascular accident) [facility])  Hospital Problems: Principal Problem:   CVA (cerebral vascular accident) ([place]) Active Problems:   Hypertension   Faintness   Chest discomfort   Left carotid stenosis   Right leg weakness   Stroke (cerebrum) (HCC)   Brainstem infarct, acute (HCC)   Acute blood loss anemia   AKI (acute kidney injury) ([place])   Tachycardia   Dysesthesia   Neuropathic pain   Drug-induced hypotension   Leukocytosis   Hyponatremia   History of hypertension   Dysphagia, post-stroke   Occult blood in stools   Symptomatic anemia    Expected Discharge Date: Expected Discharge Date: 07/18/20  Team Members Present: Physician leading conference: Dr. Alger Simons Care Coodinator Present: Loralee Pacas, LCSWA;Dorris Vangorder Creig Hines, RN, BSN, [place] Nurse Present: Other (comment) Philip Aspen, RN) PT Present: Tereasa Coop, PT OT Present: Cherylynn Ridges, OT SLP Present: Weston Anna, SLP PPS Coordinator present : Ileana Ladd, Burna Mortimer, SLP     Current Status/Progress Goal Weekly Team Focus  Bowel/Bladder             Swallow/Nutrition/ Hydration   regular textures and thin liquids, Mod I  Mod I  education and carryover of strategies   ADL's   Min A overall  supervision/CGA  transfers, activity tolerance, dc planning, sit<>stand, family education   Mobility   supervision bed mobility and transfers when foot pain manageable. minA >100' ambulation.. when feet are hurting pt is unable to stand and heavy maxA for transfers. minA >100' ambulation.   Supervision  DC prep   Communication             Safety/Cognition/ Behavioral Observations  Min-Supervision A  Supervision A  education, basic/mildly complex problem solving and recall   Pain   c/o right foot psin. Voltarin gel and tylenol has been effecrtive in providing relief  reach paIn goal 2/10  continue to treat pain has ordered   Skin   skin intact           Discharge Planning:  D/c to home with his daughter. Pt will ahve 24/7 support from various family members (daughter and her children). Family edu completed on 12/10 and 12/12 with pt dtr Heather. DME delivered. Working on [facility] to accept pt. So far no referrals. Prepare to send home pt with home exercise program (HEP), and SW will continue to explore HHAs if unable to obtain by time of discharge.   Team Discussion: Will need another colonoscopy after discharge. PT told nursing that patient needed pain medication, Tylenol given. Continent B/B. Volterin gel applied to right foot for pain. OT reports patient had been doing well, but this week not as much. PT reports patient most consistently complains of pain in feet. SLP reports patient working on sequencing picture cards but just can't figure out the patient's base line. Patient on target to meet rehab goals: yes  *See Care Plan and progress notes for long and short-term goals.   Revisions to Treatment Plan:  Medically stable for discharge  Teaching Needs:  Family education complete  Current Barriers to Discharge: Home enviroment access/layout, Lack of/limited family support, Behavior, Nutritional means and pain management.  Possible Resolutions to Barriers: Continue current medications, continue current pain management schedule, offer nutritional supplements, provide emotional support to patient and family.     Medical Summary Current Status: ongoing gouty arthritis. bowels stable, GI w/u with endo and colonscopy. mild gastritis and polyps, lovenox held. PPI  Barriers to  Discharge: Medical stability   Possible Resolutions to Barriers/Weekly Focus: pain mgt, increase prednisone. regular f/u of labs, pt data   Continued Need for Acute Rehabilitation Level of Care: The patient requires daily medical management by a physician with specialized training in physical medicine and rehabilitation for the following reasons: Direction of a multidisciplinary physical rehabilitation program to maximize functional independence : Yes Medical management of patient stability for increased activity during participation in an intensive rehabilitation regime.: Yes Analysis of laboratory values and/or radiology reports with any subsequent need for medication adjustment and/or medical intervention. : Yes   I attest that I was present, lead the team conference, and concur with the assessment and plan of the team.   Cristi Loron 07/17/2020, 3:57 PM

## 2020-07-17 NOTE — Progress Notes (Signed)
Speech Language Pathology Discharge Summary  Patient Details  Name: Andres White MRN: [id number] Date of Birth: 1946/06/14  Today's Date: 07/17/2020 SLP Individual Time: 0725-0810 SLP Individual Time Calculation (min): 45 min   Skilled Therapeutic Interventions:  Skilled treatment session focused on cognitive goals. SLP facilitated session by providing extra time and Mod verbal cues for patient to verbalize the actions within basic sequencing cards. Mod-Max A verbal cues were also needed for problem solving with 4-step picture sequencing cards. Patient left upright in bed with alarm on and all needs within reach. Continue with current plan of care.    Patient has met 4 of 8 long term goals.  Patient to discharge at overall Mod level.   Reasons goals not met: Patient's cognitive-linguistic function fluctuates but patient requires overall Mod A to complete basic and functional tasks safely in regards to problem solving and recall.   Clinical Impression/Discharge Summary: Patient has made functional but inconsistent gains and has met 4 of 8 LTGs this reporting period. Currently, patient is consuming regular textures with thin liquids with minimal overt s/s of aspiration and is overall Mod I for use of swallowing compensatory strategies. Patient demonstrates improved awareness of deficits but continues to require overall Mod A verbal cues to complete functional and familiar tasks safely in regards to recall and problem solving. Deficits are further exacerbated by patient's impairments in reading comprehension which limits utilization of compensatory strategies. Patient and family education is complete and patient will discharge home with 24 hour supervision from family. Patient would benefit from f/u SLP services to maximize his cognitive-linguistic function and overall functional independence in order to reduce caregiver burden.   Care Partner:  Caregiver Able to Provide Assistance: Yes  Type of  Caregiver Assistance: Physical;Cognitive  Recommendation:  24 hour supervision/assistance;Home Health SLP  Rationale for SLP Follow Up: Reduce caregiver burden;Maximize cognitive function and independence;Maximize functional communication   Equipment: N/A   Reasons for discharge: Discharged from hospital   Patient/Family Agrees with Progress Made and Goals Achieved: Yes    [place], [place] 07/17/2020, 6:43 AM

## 2020-07-17 NOTE — Progress Notes (Signed)
[place] PHYSICAL MEDICINE & REHABILITATION PROGRESS NOTE  Subjective/Complaints: Up in bed. Told me his feet felt good. Got up with therapy and apparently it was a different story.   ROS: Limited due to cognitive/behavioral   Objective: Vital Signs: Blood pressure 122/65, pulse 68, temperature 99.5 F (37.5 C), temperature source Oral, resp. rate 18, height 5\' 7"  (1.702 m), weight 85 kg, SpO2 100 %. No results found. No results for input(s): WBC, HGB, HCT, PLT in the last 72 hours. Recent Labs    07/16/20 0511  NA 132*  K 4.1  CL 100  CO2 24  GLUCOSE 127*  BUN 33*  CREATININE 1.47*  CALCIUM 8.7*    Intake/Output Summary (Last 24 hours) at 07/17/2020 1312 Last data filed at 07/17/2020 0957 Gross per 24 hour  Intake 596 ml  Output 1475 ml  Net -879 ml        Physical Exam: BP 122/65   Pulse 68   Temp 99.5 F (37.5 C) (Oral)   Resp 18   Ht 5\' 7"  (1.702 m)   Wt 85 kg   SpO2 100%   BMI 29.35 kg/m  Constitutional: No distress . Vital signs reviewed. HEENT: EOMI, oral membranes moist Neck: supple Cardiovascular: RRR without murmur. No JVD    Respiratory/Chest: CTA Bilaterally without wheezes or rales. Normal effort    GI/Abdomen: BS +, non-tender, non-distended Ext: no clubbing, cyanosis, or edema Psych: pleasant and cooperative Musculoskeletal: no right foot or ankle pain with general palpation,persistent swelling in right foot,ankle.  R olecranon bursa, small bursal fluid collection  dysarthric Motor: LUE/LLE: 5/5 proximal to distal RUE:   3+/5 proximal to distal--stable    RLE:  3- to3 /5 proximal to distal  Assessment/Plan: 1. Functional deficits which require 3+ hours per day of interdisciplinary therapy in a comprehensive inpatient rehab setting.  Physiatrist is providing close team supervision and 24 hour management of active medical problems listed below.  Physiatrist and rehab team continue to assess barriers to discharge/monitor patient  progress toward functional and medical goals   Care Tool:  Bathing    Body parts bathed by patient: Right arm,Left arm,Chest,Abdomen,Front perineal area,Buttocks,Face,Left lower leg,Right lower leg,Left upper leg,Right upper leg   Body parts bathed by helper: Right lower leg,Left lower leg,Buttocks,Left arm     Bathing assist Assist Level: Minimal Assistance - Patient > 75%     Upper Body Dressing/Undressing Upper body dressing   What is the patient wearing?: Pull over shirt    Upper body assist Assist Level: Contact Guard/Touching assist    Lower Body Dressing/Undressing Lower body dressing      What is the patient wearing?: Pants     Lower body assist Assist for lower body dressing: Minimal Assistance - Patient > 75%     Toileting Toileting    Toileting assist Assist for toileting: Minimal Assistance - Patient > 75%     Transfers Chair/bed transfer  Transfers assist  Chair/bed transfer activity did not occur: Safety/medical concerns  Chair/bed transfer assist level: Maximal Assistance - Patient 25 - 49%     Locomotion Ambulation   Ambulation assist   Ambulation activity did not occur: Safety/medical concerns  Assist level: Moderate Assistance - Patient 50 - 74% Assistive device: Parallel bars Max distance: 2'   Walk 10 feet activity   Assist  Walk 10 feet activity did not occur: Safety/medical concerns  Assist level: Minimal Assistance - Patient > 75% Assistive device: Walker-rolling   Walk 50 feet activity  Assist Walk 50 feet with 2 turns activity did not occur: Safety/medical concerns  Assist level: Minimal Assistance - Patient > 75% Assistive device: Walker-rolling    Walk 150 feet activity   Assist Walk 150 feet activity did not occur: Safety/medical concerns         Walk 10 feet on uneven surface  activity   Assist Walk 10 feet on uneven surfaces activity did not occur: Safety/medical concerns          Wheelchair     Assist Will patient use wheelchair at discharge?: No Type of Wheelchair: Manual    Wheelchair assist level: Supervision/Verbal cueing Max wheelchair distance: 15'    Wheelchair 50 feet with 2 turns activity    Assist            Wheelchair 150 feet activity     Assist      Assist Level: Independent   BP 122/65   Pulse 68   Temp 99.5 F (37.5 C) (Oral)   Resp 18   Ht 5\' 7"  (1.702 m)   Wt 85 kg   SpO2 100%   BMI 29.35 kg/m   Medical Problem List and Plan: 1.  Impaired mobility and ADLs secondary to acute CVA with left midbrain infarct  Continue CIR PT, OT, SLP   ELOS 12/15 2.  Antithrombotics: -DVT/anticoagulation:  Pharmaceutical: Lovenox (hold)  -dopplers are ok             -antiplatelet therapy: Continue Aspirin 81mg  daily 3. Pain Management: Tylenol prn.   Gabapentin 100 3 times daily started 11/26 for?  Neuropathic pain post stroke, decreased to nightly on 11/28 due to? Side effects, with improvement  Right wrist pain-x-ray shows OA, no acute changes, uric acid ordered and is normal      12/8 gout pain flared with reduction to 5mg , continue at 10mg  for now   -pain improved at 10mg  dose   -UA level normal  12/14 not tolerating drop to 5mg  prednisone   -resume 10mg  dose today and dc home on this dose for now   -?behavioral component. Pain more pronounced when therapy visits 4. Mood: Continue Celexa 20mg  daily for depression 5. Neuropsych: This patient is not fully capable of making decisions on his own behalf. 6. Skin/Wound Care: ZioPatch in place, dry skin- added Eucerin cream 7. Fluids/Electrolytes/Nutrition: Monitor I/Os. Encourage fluid intake  8. HTN: Monitor BP   Decrease Amlodipine to 5mg , DC'd on 11/28.   Continue Lisinopril 40mg  daily, decreased to 10 on 11/28  Generally has been controlled. Resuming 2.5 norvasc d/t HR   12/13: norvasc stopped d/t hypotension again, HR holding  9. Tobacco abuse: Provide counseling   10. Tachycardia/beats of NSVT: Zio patch in place.   HR elevated intermittently with therapy   -resume norvasc 2.5mg  12/10   -encouraging fluids   -rx anemia   -if elevations are persistent, may re-consult cards 11. AKI  Has received intermitent IVF  12/10: BUN/Cr: 28/1.29--probably not too far off his baseline  12/13: BUN/Cr 33/1.47 12. ABLA  Hb 11.2 on 11/27, 9.6 on 11/29:     further drop to 9.2 on 12/2. stool OB+ today 12/5  -hgb up to 9.5 on 12/6, stool OB+ x 2  -12/10 s/p EGD, colonoscopy today by Dr. Fuller Plan   -EGD: mild gastritis, duodenal polyps   -Col: limited d/t retained stool, non-bleeding internal hemorrhoids seen, otherwise normal in the limited view obtained   -repeat colonoscopy in 3 mos   -continue PPI, lovenox to  remain on hold   -recheck CBC today 12/14 13.  Leukocytosis steroids plus UTI   WBCs 18.5 12/6--18.8 12/8  UA equivocal, UC returned positive for enterococcus faecalis sensitive to nitrofurantoin- ordered.   Recent CXR normal but crackles on right  Remains Afebrile  CXR 12/8 is clear  -Incentive spirometry  -likely steroid related  -12/10 repeat UA negative, ucx with 20k again with enterococcus   -dc macrobid and try 3 days amoxil instead   -wbc's 17k 12/10: repeat today 12/14.  14.  Hyponatremia  Sodium 132 12/13 15.  Post stroke dysphagia  D2 thins  Advance diet as tolerated per SLP 16. HLD: continue lipitor 40mg  daily      LOS: 20 days A FACE TO FACE EVALUATION WAS PERFORMED  Meredith Staggers 07/17/2020, 1:12 PM

## 2020-07-17 NOTE — Plan of Care (Signed)
  Problem: RH Balance Goal: LTG: Patient will maintain dynamic sitting balance (OT) Description: LTG:  Patient will maintain dynamic sitting balance with assistance during activities of daily living (OT) Outcome: Completed/Met Goal: LTG Patient will maintain dynamic standing with ADLs (OT) Description: LTG:  Patient will maintain dynamic standing balance with assist during activities of daily living (OT)  Outcome: Completed/Met   Problem: Sit to Stand Goal: LTG:  Patient will perform sit to stand in prep for activites of daily living with assistance level (OT) Description: LTG:  Patient will perform sit to stand in prep for activites of daily living with assistance level (OT) Outcome: Completed/Met   Problem: RH Eating Goal: LTG Patient will perform eating w/assist, cues/equip (OT) Description: LTG: Patient will perform eating with assist, with/without cues using equipment (OT) Outcome: Completed/Met   Problem: RH Grooming Goal: LTG Patient will perform grooming w/assist,cues/equip (OT) Description: LTG: Patient will perform grooming with assist, with/without cues using equipment (OT) Outcome: Completed/Met   Problem: RH Bathing Goal: LTG Patient will bathe all body parts with assist levels (OT) Description: LTG: Patient will bathe all body parts with assist levels (OT) Outcome: Completed/Met   Problem: RH Dressing Goal: LTG Patient will perform upper body dressing (OT) Description: LTG Patient will perform upper body dressing with assist, with/without cues (OT). Outcome: Completed/Met Goal: LTG Patient will perform lower body dressing w/assist (OT) Description: LTG: Patient will perform lower body dressing with assist, with/without cues in positioning using equipment (OT) Outcome: Completed/Met   Problem: RH Toileting Goal: LTG Patient will perform toileting task (3/3 steps) with assistance level (OT) Description: LTG: Patient will perform toileting task (3/3 steps) with  assistance level (OT)  Outcome: Completed/Met   Problem: RH Functional Use of Upper Extremity Goal: LTG Patient will use RT/LT upper extremity as a (OT) Description: LTG: Patient will use right/left upper extremity as a stabilizer/gross assist/diminished/nondominant/dominant level with assist, with/without cues during functional activity (OT) Outcome: Completed/Met   Problem: RH Toilet Transfers Goal: LTG Patient will perform toilet transfers w/assist (OT) Description: LTG: Patient will perform toilet transfers with assist, with/without cues using equipment (OT) Outcome: Completed/Met   Problem: RH Tub/Shower Transfers Goal: LTG Patient will perform tub/shower transfers w/assist (OT) Description: LTG: Patient will perform tub/shower transfers with assist, with/without cues using equipment (OT) Outcome: Completed/Met   Problem: RH Awareness Goal: LTG: Patient will demonstrate awareness during functional activites type of (OT) Description: LTG: Patient will demonstrate awareness during functional activites type of (OT) Outcome: Completed/Met

## 2020-07-17 NOTE — Progress Notes (Signed)
Patient ID: Andres White, male   DOB: 09/16/1945, 74 y.o.   MRN: [id number]  SW sent HHPT/OT/SLP/aide referral to [facility]. SW waiting on follow-up.  *Referral declined due to staffing.   SW sent referral to [facility]. SW waiting on follow-up.   Loralee Pacas, MSW, [place] Office: [phone] Cell: [phone] Fax: [phone]

## 2020-07-17 NOTE — Progress Notes (Signed)
Occupational Therapy Discharge Summary  Patient Details  Name: Andres White MRN: [id number] Date of Birth: 11-12-1945  Today's Date: 07/17/2020 OT Individual Time: 1300-1400 OT Individual Time Calculation (min): 60 min    Pt greeted seated in wc and agreeable to OT treatment session. Pt declined BADLs, but agreeable to practice LB dressing. Sit<>stand at the sink with verbal cues for hand placement and mod A to achieve stand. OT facilitation to achieve full upright position, then pt able to remove unilateral UE to pull down pants. Practiced donning pants with pt able to reach forward enough to thread them without OT assist. 2nd sit<>stand at the sink was min A and pt able to maintain balance to alternate UEs to pull up pants. OT provided pt with home exercises program and home fine motor program and reviewed them with patient and his daughter. Had patient practice each exercise with orange theraband 5 reps. Pt left seated in wc at end of session with daughter present, alarm belt on, and needs met.   Patient has met 13 of 13 long term goals due to improved activity tolerance, improved balance, postural control, ability to compensate for deficits, functional use of  RIGHT upper and RIGHT lower extremity, improved attention, improved awareness and improved coordination.  Patient to discharge at [facility] Assist level.  Patient's care partner is independent to provide the necessary physical and cognitive assistance at discharge.    Reasons goals not met: N/A  Recommendation:  Patient will benefit from ongoing skilled OT services in home health setting to continue to advance functional skills in the area of BADL, Reduce care partner burden and FUNCTIONAL USE OF R UE.  Equipment: Tub transfer bench, 3-in-1 BSC, RW, wheelchair  Reasons for discharge: treatment goals met and discharge from hospital  Patient/family agrees with progress made and goals achieved: Yes  OT  Discharge Precautions/Restrictions  Precautions Precautions: Fall Restrictions Weight Bearing Restrictions: No Pain Pt reports pain in B feet, no number given. Rest and repositioned for comfort. ADL ADL Eating: Independent Grooming: Modified independent Upper Body Bathing: Setup,Supervision/safety Lower Body Bathing: Minimal assistance Upper Body Dressing: Supervision/safety Lower Body Dressing: Minimal assistance Toileting: Unable to assess Toilet Transfer: Minimal assistance Tub/Shower Transfer: Minimal assistance Cognition Overall Cognitive Status: Impaired/Different from baseline Arousal/Alertness: Awake/alert Orientation Level: Oriented to person;Oriented to place;Oriented to situation;Disoriented to time Attention: Sustained Focused Attention: Appears intact Sustained Attention: Appears intact Memory: Impaired Memory Impairment: Decreased short term memory Awareness: Impaired Awareness Impairment: Anticipatory impairment Problem Solving: Impaired Problem Solving Impairment: Verbal basic;Functional basic Safety/Judgment: Impaired Sensation Sensation Light Touch: Appears Intact Hot/Cold: Appears Intact Coordination Gross Motor Movements are Fluid and Coordinated: No Fine Motor Movements are Fluid and Coordinated: No Coordination and Movement Description: improved smoothness and accuracy with R hand Motor  Motor Motor - Discharge Observations: Mild hemiplegia Mobility  Bed Mobility Supine to Sit: Supervision/Verbal cueing Sit to Supine: Supervision/Verbal cueing  Balance Static Sitting Balance Static Sitting - Balance Support: Feet supported Static Sitting - Level of Assistance: 5: Stand by assistance Dynamic Sitting Balance Dynamic Sitting - Balance Support: During functional activity Dynamic Sitting - Level of Assistance: 5: Stand by assistance Static Standing Balance Static Standing - Balance Support: During functional activity Static Standing - Level of  Assistance: 4: Min assist Dynamic Standing Balance Dynamic Standing - Balance Support: During functional activity Dynamic Standing - Level of Assistance: 4: Min assist Extremity/Trunk Assessment RUE Assessment RUE Assessment: Exceptions to [facility] Active Range of Motion (AROM) Comments: Shoulder/elbow/wrist/Hand ROM WFL- still  has strength deficits and coordination deficits in R hand RUE Body System: Neuro Brunstrum levels for arm and hand: Hand;Arm Brunstrum level for arm: Stage V Relative Independence from Synergy Brunstrum level for hand: Stage VI Isolated joint movements RUE Strength Right Shoulder Flexion: 4/5 LUE Assessment LUE Assessment: Within Functional Limits   Daneen Schick Zaxton Angerer 07/17/2020, 3:39 PM

## 2020-07-17 NOTE — Progress Notes (Signed)
Physical Therapy Discharge Summary  Patient Details  Name: Andres White MRN: [id number] Date of Birth: 29-Apr-1946  Today's Date: 07/17/2020 PT Individual Time: 867-619 and [id number] PT Time: 60 min and 30 min Missed Time: 15 min (pain)   Patient has met 4 of 9 long term goals due to improved activity tolerance, improved balance, improved postural control and increased strength.  Patient to discharge at an ambulatory level [place].   Patient's care partner is independent to provide the necessary physical assistance at discharge.  Reasons goals not met: Pt has had fluctuating levels of bilateral foot pain that has inhibited his progress at times. Pt's wife has been present to see pt when feet are hurting and is aware and capable of providing aid as needed when pt requires increased assistance.  Recommendation:  Patient will benefit from ongoing skilled PT services in home health setting to continue to advance safe functional mobility, address ongoing impairments in strength, balance, ambulation, and minimize fall risk.  Equipment: 18" x 18" manual WC  Reasons for discharge: discharge from hospital  Patient/family agrees with progress made and goals achieved: Yes   Skilled Therapeutic Interventions:  1st Session: Pt received supine in bed and agrees to therapy. No complaint of pain. Supine to sit with supervision and use of bed features. Pt attempts sit to stand with RW but is unable to complete transfer secondary to complaint of pain in feet. PT provides mod/maxA and pt able to complete transfer and stand pivot to WC. WC transport to gym for time management. Pt attempts standing in parallel bars and is still unable to complete without assistance. Pt performs x3 reps sit to stand in parallel bars with modA and multimodal cues on body mechanics and hand placement. Pt attempts ambulating in bars but cannot clear feet. Pt says feet and back are both hurting and this is why he cannot perform  mobility successfully. Activity adjusted and pt attempts to transfer to mat table but cannot clear buttocks from WC. Pt transported back to room and left seated in [facility] with alarm intact and all needs within reach. RN alerted to pt's pain levels and session ended 15 minutes early due to uncontrolled pain inhibiting participation.  2nd Session: Pt received seated in [facility] with daughter present and is agreeable to therapy. Reports improvement in pain levels. WC transport to gym for time management. Pt performs car transfer with RW and minA. Pt performs sit to stand with daughter providing minA and ambulates x50' with RW and CGA. Pt then performs x1 step with RW and PT providing CGA, with cues on RW management and sequencing. WC transport back to room. PT provides pt with HEP printout. Stand pivot transfer back to bed with supervision and cues on sequencing. Sit to supine with supervision. Left supine with alarm intact and all needs within reach.  PT Discharge Precautions/Restrictions Precautions Precautions: Fall Restrictions Weight Bearing Restrictions: No Vision/Perception  Praxis Praxis: Intact  Cognition Overall Cognitive Status: Impaired/Different from baseline Arousal/Alertness: Awake/alert Orientation Level: Oriented to person;Oriented to place;Oriented to situation;Disoriented to time Attention: Sustained Focused Attention: Appears intact Sustained Attention: Appears intact Memory: Impaired Memory Impairment: Decreased short term memory Awareness: Impaired Awareness Impairment: Anticipatory impairment Problem Solving: Impaired Problem Solving Impairment: Verbal basic;Functional basic Safety/Judgment: Impaired Sensation Sensation Light Touch: Appears Intact Hot/Cold: Appears Intact Coordination Gross Motor Movements are Fluid and Coordinated: No Fine Motor Movements are Fluid and Coordinated: No Coordination and Movement Description: improved smoothness and accuracy with R  hand Motor  Motor Motor - Discharge Observations: Mild hemiplegia  Mobility Bed Mobility Supine to Sit: Supervision/Verbal cueing Sit to Supine: Supervision/Verbal cueing Transfers Transfers: Stand Pivot Transfers;Squat Pivot Transfers Stand Pivot Transfers: Minimal Assistance - Patient > 75% Squat Pivot Transfers: Contact Guard/Touching assist Locomotion  Gait Ambulation: Yes Gait Assistance: Contact Guard/Touching assist Gait Distance (Feet): 50 Feet Assistive device: Rolling walker Gait Gait: Yes Gait Pattern: Impaired Gait Pattern: Trunk flexed;Antalgic;Decreased stride length Gait velocity: decreased Stairs / Additional Locomotion Stairs: Yes Stairs Assistance: Contact Guard/Touching assist Stair Management Technique: With walker Number of Stairs: 1 Height of Stairs: 3 Curb: Contact Guard/Touching assist Wheelchair Mobility Wheelchair Mobility: No  Trunk/Postural Assessment  Cervical Assessment Cervical Assessment:  (forward head) Thoracic Assessment Thoracic Assessment:  (rounded shoulders) Lumbar Assessment Lumbar Assessment:  (posterior pelvic tilt) Postural Control Postural Control:  (posterior lean in sitting)  Balance Static Sitting Balance Static Sitting - Balance Support: Feet supported Static Sitting - Level of Assistance: 5: Stand by assistance Dynamic Sitting Balance Dynamic Sitting - Balance Support: During functional activity Dynamic Sitting - Level of Assistance: 5: Stand by assistance Static Standing Balance Static Standing - Balance Support: During functional activity Static Standing - Level of Assistance: 4: Min assist Dynamic Standing Balance Dynamic Standing - Balance Support: During functional activity Dynamic Standing - Level of Assistance: 4: Min assist Extremity Assessment  RUE Assessment RUE Assessment: Exceptions to [facility] Active Range of Motion (AROM) Comments: Shoulder/elbow/wrist/Hand ROM WFL- still has strength deficits and  coordination deficits in R hand RUE Body System: Neuro Brunstrum levels for arm and hand: Hand;Arm Brunstrum level for arm: Stage V Relative Independence from [employer] Brunstrum level for hand: Stage VI Isolated joint movements RUE Strength Right Shoulder Flexion: 4/5 LUE Assessment LUE Assessment: Within Functional Limits RLE Assessment RLE Assessment: Exceptions to [facility] General Strength Comments: Grossly 3/5 LLE Assessment LLE Assessment: Exceptions to [facility] General Strength Comments: Grossly 4/5    Breck Coons, PT, DPT 07/17/2020, 3:36 PM

## 2020-07-18 MED ORDER — ATORVASTATIN CALCIUM 40 MG PO TABS: 40.0000 mg | ORAL_TABLET | Freq: Every day | ORAL | 0 refills | Status: DC

## 2020-07-18 MED ORDER — LISINOPRIL 10 MG PO TABS: 10.0000 mg | ORAL_TABLET | Freq: Every day | ORAL | 0 refills | Status: DC

## 2020-07-18 MED ORDER — CITALOPRAM HYDROBROMIDE 20 MG PO TABS: 20.0000 mg | ORAL_TABLET | Freq: Every day | ORAL | 0 refills | Status: DC

## 2020-07-18 MED ORDER — GABAPENTIN 100 MG PO CAPS: 100.0000 mg | ORAL_CAPSULE | Freq: Two times a day (BID) | ORAL | 0 refills | Status: DC

## 2020-07-18 MED ORDER — DICLOFENAC SODIUM 1 % EX GEL: 2.0000 g | Freq: Four times a day (QID) | CUTANEOUS | 1 refills | Status: DC

## 2020-07-18 MED ORDER — PANTOPRAZOLE SODIUM 40 MG PO TBEC: 40.0000 mg | DELAYED_RELEASE_TABLET | Freq: Two times a day (BID) | ORAL | 1 refills | Status: DC

## 2020-07-18 MED ORDER — ASPIRIN EC 81 MG PO TBEC: 81.0000 mg | DELAYED_RELEASE_TABLET | Freq: Every day | ORAL | 3 refills | Status: DC

## 2020-07-18 MED ORDER — TAMSULOSIN HCL 0.4 MG PO CAPS: 0.4000 mg | ORAL_CAPSULE | Freq: Every day | ORAL | 0 refills | Status: DC

## 2020-07-18 MED ORDER — LISINOPRIL 10 MG PO TABS
10.0000 mg | ORAL_TABLET | Freq: Every day | ORAL | 0 refills | Status: DC
Start: 2020-07-19 — End: 2020-08-06

## 2020-07-18 MED ORDER — PANTOPRAZOLE SODIUM 40 MG PO TBEC: 40.0000 mg | DELAYED_RELEASE_TABLET | Freq: Two times a day (BID) | ORAL | 0 refills | Status: DC

## 2020-07-18 MED ORDER — PREDNISONE 10 MG PO TABS: ORAL_TABLET | ORAL | 0 refills | Status: DC

## 2020-07-18 MED ORDER — ADULT MULTIVITAMIN W/MINERALS CH
1.0000 | ORAL_TABLET | Freq: Every day | ORAL | Status: AC
Start: 2020-07-19 — End: ?

## 2020-07-18 MED ORDER — ADULT MULTIVITAMIN W/MINERALS CH: 1.0000 | ORAL_TABLET | Freq: Every day | ORAL | Status: DC

## 2020-07-18 MED ORDER — HYDROCERIN EX CREA: 1.0000 "application " | TOPICAL_CREAM | Freq: Two times a day (BID) | CUTANEOUS | 0 refills | Status: DC

## 2020-07-18 NOTE — Discharge Instructions (Signed)
Inpatient Rehab Discharge Instructions  Andres White Discharge date and time:  07/18/20  Activities/Precautions/ Functional Status: Activity: no lifting, driving, or strenuous exercise till cleared by MD Diet: Chopped foods--need to increase fluid intake.  Wound Care: none needed    Functional status:  ___ No restrictions     ___ Walk up steps independently _X__ 24/7 supervision/assistance   ___ Walk up steps with assistance ___ Intermittent supervision/assistance  ___ Bathe/dress independently ___ Walk with walker     ___ Bathe/dress with assistance ___ Walk Independently    ___ Shower independently ___ Walk with assistance    _X__ Shower with assistance _X__ No alcohol     ___ Return to work/school ________   COMMUNITY REFERRALS UPON DISCHARGE:    Home Health:   PT     OT     ST     SNA                       Agency: Phone:   Medical Equipment/Items Ordered: rolling walker, 3-in-1 bedside commode, tub transfer bench, wheelchair                                                 Agency/Supplier: [employer] Cigarette smoking nearly doubles your risk of having a stroke & is the single most alterable risk factor  If you smoke or have smoked in the last 12 months, you are advised to quit smoking for your health.  Most of the excess cardiovascular risk related to smoking disappears within a year of stopping.  Ask you doctor about anti-smoking medications  [place] Quit Line: 1-800-QUIT NOW  Free Smoking Cessation Classes [phone]  CHOLESTEROL Know your levels; limit fat & cholesterol in your diet  Lipid Panel     Component Value Date/Time   CHOL 96 06/25/2020 0441   TRIG 87 06/25/2020 0441   HDL 47 06/25/2020 0441   CHOLHDL 2.0 06/25/2020 0441   VLDL 17 06/25/2020 0441   LDLCALC 32 06/25/2020 0441      Many patients benefit from treatment even if their cholesterol is at goal.  Goal: Total Cholesterol (CHOL)  less than 160  Goal:  Triglycerides (TRIG) less than 150  Goal:  HDL greater than 40  Goal:  LDL (LDLCALC) less than 100   BLOOD PRESSURE American Stroke Association blood pressure target is less that 120/80 mm/Hg  Your discharge blood pressure is:  BP: 106/63  Monitor your blood pressure  Limit your salt and alcohol intake  Many individuals will require more than one medication for high blood pressure  DIABETES (A1c is a blood sugar average for last 3 months) Goal HGBA1c is under 7% (HBGA1c is blood sugar average for last 3 months)  Diabetes: No known diagnosis of diabetes    Lab Results  Component Value Date   HGBA1C 4.8 06/25/2020     Your HGBA1c can be lowered with medications, healthy diet, and exercise.  Check your blood sugar as directed by your physician  Call your physician if you experience unexplained or low blood sugars.  PHYSICAL ACTIVITY/REHABILITATION Goal is 30 minutes at least 4 days per week  Activity: No driving, Therapies: see above Return to work: N/A  Activity decreases your risk of heart attack and stroke and makes your heart stronger.  It helps control your weight and blood pressure; helps you relax and can improve your mood.  Participate in a regular exercise program.  Talk with your doctor about the best form of exercise for you (dancing, walking, swimming, cycling).  DIET/WEIGHT Goal is to maintain a healthy weight  Your discharge diet is:  Diet Order            Diet Heart Room service appropriate? Yes; Fluid consistency: Thin  Diet effective now                liquids Your height is:  Height: 5\' 7"  (170.2 cm) Your current weight is: Weight: 85 kg Your Body Mass Index (BMI) is:  BMI (Calculated): 29.34  Following the type of diet specifically designed for you will help prevent another stroke.  Your goal weight is:  159 lbs  Your goal Body Mass Index (BMI) is 19-24.  Healthy food habits can help reduce 3 risk factors for stroke:  High  cholesterol, hypertension, and excess weight.  RESOURCES Stroke/Support Group:  Call [phone]   STROKE EDUCATION PROVIDED/REVIEWED AND GIVEN TO PATIENT Stroke warning signs and symptoms How to activate emergency medical system (call 911). Medications prescribed at discharge. Need for follow-up after discharge. Personal risk factors for stroke. Pneumonia vaccine given:  Flu vaccine given:  My questions have been answered, the writing is legible, and I understand these instructions.  I will adhere to these goals & educational materials that have been provided to me after my discharge from the hospital.    Special Instructions: 1. Need to increase water intake--you are getting dehydrated and this can cause gout to flare up.     My questions have been answered and I understand these instructions. I will adhere to these goals and the provided educational materials after my discharge from the hospital.  Patient/Caregiver Signature _______________________________ Date __________  Clinician Signature _______________________________________ Date __________  Please bring this form and your medication list with you to all your follow-up doctor's appointments.

## 2020-07-18 NOTE — Progress Notes (Signed)
[place] PHYSICAL MEDICINE & REHABILITATION PROGRESS NOTE  Subjective/Complaints: No new complaints. Happy to be going home today!. Denies pain  ROS: Patient denies fever, rash, sore throat, blurred vision, nausea, vomiting, diarrhea, cough, shortness of breath or chest pain, joint or back pain, headache, or mood change.    Objective: Vital Signs: Blood pressure (!) 102/54, pulse 62, temperature 98 F (36.7 C), resp. rate 18, height 5\' 7"  (1.702 m), weight 85 kg, SpO2 100 %. No results found. Recent Labs    07/17/20 1421  WBC 12.1*  HGB 9.0*  HCT 28.3*  PLT 285   Recent Labs    07/16/20 0511  NA 132*  K 4.1  CL 100  CO2 24  GLUCOSE 127*  BUN 33*  CREATININE 1.47*  CALCIUM 8.7*    Intake/Output Summary (Last 24 hours) at 07/18/2020 0843 Last data filed at 07/18/2020 0416 Gross per 24 hour  Intake 236 ml  Output 1275 ml  Net -1039 ml        Physical Exam: BP (!) 102/54 (BP Location: Left Arm)   Pulse 62   Temp 98 F (36.7 C)   Resp 18   Ht 5\' 7"  (1.702 m)   Wt 85 kg   SpO2 100%   BMI 29.35 kg/m  Constitutional: No distress . Vital signs reviewed. HEENT: EOMI, oral membranes moist Neck: supple Cardiovascular: RRR without murmur. No JVD    Respiratory/Chest: CTA Bilaterally without wheezes or rales. Normal effort    GI/Abdomen: BS +, non-tender, non-distended Ext: no clubbing, cyanosis, or edema Psych: pleasant and cooperative Musculoskeletal: no right foot or ankle pain with general palpation,persistent swelling in right foot,ankle really unchanged. .  R olecranon bursa, small bursal fluid collection  dysarthric Motor: LUE/LLE: 5/5 proximal to distal RUE:   3+/5 proximal to distal   RLE:  3 /5 proximal to distal  Assessment/Plan: 1. Functional deficits which require 3+ hours per day of interdisciplinary therapy in a comprehensive inpatient rehab setting.  Physiatrist is providing close team supervision and 24 hour management of active medical  problems listed below.  Physiatrist and rehab team continue to assess barriers to discharge/monitor patient progress toward functional and medical goals   Care Tool:  Bathing    Body parts bathed by patient: Right arm,Left arm,Abdomen,Chest,Buttocks,Front perineal area,Right upper leg,Left upper leg,Left lower leg,Right lower leg,Face   Body parts bathed by helper: Right lower leg,Left lower leg,Buttocks,Left arm     Bathing assist Assist Level: Minimal Assistance - Patient > 75%     Upper Body Dressing/Undressing Upper body dressing   What is the patient wearing?: Pull over shirt    Upper body assist Assist Level: Set up assist    Lower Body Dressing/Undressing Lower body dressing      What is the patient wearing?: Pants     Lower body assist Assist for lower body dressing: Contact Guard/Touching assist     Toileting Toileting    Toileting assist Assist for toileting: Minimal Assistance - Patient > 75%     Transfers Chair/bed transfer  Transfers assist  Chair/bed transfer activity did not occur: Safety/medical concerns  Chair/bed transfer assist level: Supervision/Verbal cueing     Locomotion Ambulation   Ambulation assist   Ambulation activity did not occur: Safety/medical concerns  Assist level: Contact Guard/Touching assist Assistive device: Walker-rolling Max distance: 50'   Walk 10 feet activity   Assist  Walk 10 feet activity did not occur: Safety/medical concerns  Assist level: Contact Guard/Touching assist Assistive device:  Walker-rolling   Walk 50 feet activity   Assist Walk 50 feet with 2 turns activity did not occur: Safety/medical concerns  Assist level: Contact Guard/Touching assist Assistive device: Walker-rolling    Walk 150 feet activity   Assist Walk 150 feet activity did not occur: Safety/medical concerns         Walk 10 feet on uneven surface  activity   Assist Walk 10 feet on uneven surfaces activity did  not occur: Safety/medical concerns         Wheelchair     Assist Will patient use wheelchair at discharge?: No Type of Wheelchair: Manual    Wheelchair assist level: Supervision/Verbal cueing Max wheelchair distance: 15'    Wheelchair 50 feet with 2 turns activity    Assist            Wheelchair 150 feet activity     Assist      Assist Level: Independent   BP (!) 102/54 (BP Location: Left Arm)   Pulse 62   Temp 98 F (36.7 C)   Resp 18   Ht 5\' 7"  (1.702 m)   Wt 85 kg   SpO2 100%   BMI 29.35 kg/m   Medical Problem List and Plan: 1.  Impaired mobility and ADLs secondary to acute CVA with left midbrain infarct  Dc home today  Patient to see me in the office for transitional care encounter in 1-2 weeks.  2.  Antithrombotics: -DVT/anticoagulation:  Pharmaceutical: Lovenox (hold)  -dopplers are ok             -antiplatelet therapy: Continue Aspirin 81mg  daily 3. Pain Management: Tylenol prn.   Gabapentin 100 3 times daily started 11/26 for?  Neuropathic pain post stroke, decreased to nightly on 11/28 due to? Side effects, with improvement  Right wrist pain-x-ray shows OA, no acute changes, uric acid ordered and is normal      12/8 gout pain flared with reduction to 5mg , continue at 10mg  for now   -pain improved at 10mg  dose   -UA level normal  12/14 not tolerating drop to 5mg  prednisone   -resume 10mg  dose today and dc home on this dose for now   -appears to be a behavioral component. Pain more pronounced when therapy visits  12/15 can continue prednisone 10mg  daily for one week then 5mg  daily for one week, then stop. 4. Mood: Continue Celexa 20mg  daily for depression 5. Neuropsych: This patient is not fully capable of making decisions on his own behalf. 6. Skin/Wound Care: ZioPatch in place, dry skin- added Eucerin cream 7. Fluids/Electrolytes/Nutrition: Monitor I/Os. Encourage fluid intake  8. HTN: Monitor BP   Decrease Amlodipine to 5mg , DC'd  on 11/28.   Continue Lisinopril 40mg  daily, decreased to 10 on 11/28  Generally has been controlled. Resuming 2.5 norvasc d/t HR   12/15: norvasc stopped d/t hypotension again, HR holding  9. Tobacco abuse: Provide counseling  10. Tachycardia/beats of NSVT: Zio patch in place.   HR elevated intermittently with therapy   -improved in general   -encourage adequate fluids   -rx anemia 11. AKI  Has received intermitent IVF  12/10: BUN/Cr: 28/1.29--probably not too far off his baseline  12/13: BUN/Cr 33/1.47 12. ABLA  Heme + stool, hgb drop  -12/10 s/p EGD, colonoscopy today by Dr. Fuller Plan   -EGD: mild gastritis, duodenal polyps   -Col: limited d/t retained stool, non-bleeding internal hemorrhoids seen, otherwise normal in the limited view obtained   -repeat colonoscopy in 3 mos   -  continue PPI, lovenox to remain on hold   -hgb stable and now up to 9 today 12/15! 13.  Leukocytosis steroids plus UTI   WBCs 18.5 12/6--18.8 12/8  UA equivocal, UC returned positive for enterococcus faecalis sensitive to nitrofurantoin- ordered.   Recent CXR normal but crackles on right  Remains Afebrile  CXR 12/8 is clear  -Incentive spirometry  -likely steroid related  -12/10 repeat UA negative, ucx with 20k again with enterococcus   -dc macrobid and try 3 days amoxil instead   -wbc's down to 12k 12/14 14.  Hyponatremia  Sodium 132 12/13 15.  Post stroke dysphagia  D2 thins  Advance diet as tolerated per SLP 16. HLD: continue lipitor 40mg  daily      LOS: 21 days A FACE TO FACE EVALUATION WAS PERFORMED  Meredith Staggers 07/18/2020, 8:43 AM

## 2020-07-18 NOTE — Progress Notes (Signed)
Inpatient Rehabilitation Care Coordinator  Discharge Note  The overall goal for the admission was met for:   Discharge location: Yes. D/c to his daughter Heather's home in [place]. Family to provide 24/7 care.   Length of Stay: Yes. 20 days.   Discharge activity level: Yes. Minimal Assistance  Home/community participation: Yes. Limited.   Services provided included: MD, RD, PT, OT, SLP, RN, CM, TR, Pharmacy, Neuropsych and SW  Financial Services: Private Insurance: [facility] Medicare  Follow-up services arranged: Home Health: [facility] for HHPT/OT/SLP/Aide (start or care on 12/20) and DME: [place] for RW, w/c, 3in1 BSC,and TTB  Comments (or additional information): contact [name] [phone]  Patient/Family verbalized understanding of follow-up arrangements: Yes  Individual responsible for coordination of the follow-up plan: Pt to have assistance with coordinating care needs.   Confirmed correct DME delivered: Rana Snare 07/18/2020    Rana Snare

## 2020-07-18 NOTE — Discharge Summary (Signed)
Physician Discharge Summary  Patient ID: Andres White MRN: [id number] DOB/AGE: 03/01/1946 74 y.o.  Admit date: 06/27/2020 Discharge date: 07/18/2020  Discharge Diagnoses:  Principal Problem:   Brainstem infarct, acute [facility]) Active Problems:   Left carotid stenosis   Right leg weakness   Acute blood loss anemia   AKI (acute kidney injury) ([place])   Dysesthesia   Neuropathic pain   Leukocytosis   Hyponatremia   History of hypertension   Dysphagia, post-stroke   Occult blood in stools   Discharged Condition: stable   Significant Diagnostic Studies: DG Chest 2 View  Result Date: 07/11/2020 CLINICAL DATA:  Cough. EXAM: CHEST - 2 VIEW COMPARISON:  July 01, 2020. FINDINGS: The heart size and mediastinal contours are within normal limits. Both lungs are clear. The visualized skeletal structures are unremarkable. IMPRESSION: No active cardiopulmonary disease. Electronically Signed   By: Marijo Conception M.D.   On: 07/11/2020 12:45   DG Chest 2 View  Result Date: 07/01/2020 CLINICAL DATA:  Leukocytosis EXAM: CHEST - 2 VIEW COMPARISON:  11/16/2017 FINDINGS: Cardiac shadow is within normal limits. External monitoring device is noted over the left chest. The lungs are clear bilaterally. No acute bony abnormality is seen. IMPRESSION: No active cardiopulmonary disease. Electronically Signed   By: Inez Catalina M.D.   On: 07/01/2020 21:58   DG Wrist Complete Right  Result Date: 07/01/2020 CLINICAL DATA:  Right wrist pain, no known injury, initial encounter EXAM: RIGHT WRIST - COMPLETE 3+ VIEW COMPARISON:  None. FINDINGS: Mild degenerative changes are noted at the radiocarpal joint. No acute fracture or dislocation is seen. Old healed fracture in the fifth metacarpal is noted. Degenerative changes in the first MCP joint and CMC joint are noted as well. Diffuse vascular calcifications are seen. No soft tissue abnormality is noted. IMPRESSION: Degenerative change of the wrist without acute  abnormality. Electronically Signed   By: Inez Catalina M.D.   On: 07/01/2020 21:59   CT HEAD WO CONTRAST  Result Date: 06/30/2020 CLINICAL DATA:  Stroke follow-up. EXAM: CT HEAD WITHOUT CONTRAST TECHNIQUE: Contiguous axial images were obtained from the base of the skull through the vertex without intravenous contrast. COMPARISON:  MRI and CT June 24, 2020. FINDINGS: Brain: Mild edema associated with the known left midbrain infarcts. No evidence of acute hemorrhage or substantial mass effect. Similar chronic infarcts involving the left frontal lobe, right parietal temporal lobe, bilateral cerebellar hemispheres, and bilateral thalami. Patchy white matter hypoattenuation, likely related to chronic microvascular ischemic disease. No hydrocephalus. Vascular: Calcific atherosclerosis. Skull: No acute fracture. Sinuses/Orbits: No acute finding. Other: No mastoid effusions. IMPRESSION: Mild edema associated with the known left midbrain infarcts. No evidence of acute hemorrhage or substantial mass effect. Electronically Signed   By: Margaretha Sheffield MD   On: 06/30/2020 13:15    DG Foot Complete Right  Result Date: 07/09/2020 CLINICAL DATA:  Right foot pain EXAM: RIGHT FOOT COMPLETE - 3+ VIEW COMPARISON:  None. FINDINGS: Advanced degenerative changes at the 1st MTP joint with mild hallux valgus deformity. Mild degenerative changes in the tarsal region. No acute bony abnormality. Specifically, no fracture, subluxation, or dislocation. IMPRESSION: Degenerative changes in the right foot as above. No acute bony abnormality. Electronically Signed   By: Rolm Baptise M.D.   On: 07/09/2020 11:00       Labs:  Basic Metabolic Panel: BMP Latest Ref Rng & Units 07/16/2020 07/13/2020 07/10/2020  Glucose 70 - 99 mg/dL 127(H) 103(H) 110(H)  BUN 8 - 23 mg/dL 33(H)  28(H) 31(H)  Creatinine 0.61 - 1.24 mg/dL 1.47(H) 1.29(H) 1.27(H)  Sodium 135 - 145 mmol/L 132(L) 137 134(L)  Potassium 3.5 - 5.1 mmol/L 4.1 4.9 4.6   Chloride 98 - 111 mmol/L 100 102 103  CO2 22 - 32 mmol/L 24 25 21(L)  Calcium 8.9 - 10.3 mg/dL 8.7(L) 9.0 9.2    CBC: Recent Labs  Lab 07/12/20 0303 07/13/20 0412 07/17/20 1421  WBC 18.5* 17.5* 12.1*  HGB 8.6* 8.7* 9.0*  HCT 26.8* 27.5* 28.3*  MCV 94.4 94.8 94.6  PLT 330 326 285    CBG: Recent Labs  Lab 07/13/20 2101  GLUCAP 120*    Brief HPI:   Andres White is a 74 y.o. male with history of HTN, arthritis, tobacco use who was admitted to [facility] on 06/24/2020 with 4-day history of right leg weakness, left facial weakness and speech difficulties.  MRI brain was positive for small acute infarcts in left midbrain.  Carotid Dopplers done showing 70 to 99% stenosis of left ICA/CCA as well as moderate heterogenous calcified plaque at right carotid bifurcation.  2D echo showed EF of 60 to 65% with severe concentric left ventricular hypertrophy.  Neurology recommended aspirin and statin for secondary stroke prevention.  Zio patch placed due to bouts of asymptomatic NSVT and patient follow-up with cardiology on outpatient basis.  Therapy evaluations were completed revealing mild to moderate cognitive linguistic deficits with mild dysarthria, right-sided weakness as well as cognitive deficits affecting ADLs and mobility.  CIR was recommended due to functional decline.   Hospital Course: Andres White was admitted to rehab 06/27/2020 for inpatient therapies to consist of PT, ST and OT at least three hours five days a week. Past admission physiatrist, therapy team and rehab RN have worked together to provide customized collaborative inpatient rehab.  His blood pressures were monitored on twice daily basis and were noted to be on low side.  Amlodipine was discontinued and lisinopril was decreased to 10 mg a day.   Hospital course was significant for downward trending hemoglobin from 11.2-9.2 with heme positive stools.  Lovenox was discontinued.  Dr. Fuller Plan gastroenterology was consulted for input  and patient underwent EGD and colonoscopy showing mild gastritis with duodenal polyps.  Colonoscopy was limited due to retained stool and nonbleeding internal hemorrhoids noted.  He was placed on PPI and is to follow-up with GI in 2 to 3 months for repeat colonoscopy.  Enterococcus UTI was treated with 10 day course of nitrofurantoin.  Reactive leukocytosis is resolving with mild elevation likely due to presence of steroids. Acute on chronic renal failure has been monitored with serial check of electrolytes and he was briefly treated with IV fluids with improvement. Currently he is showing upward trend in creatinine back to 1.3 and has been advised to increase fluid intake after discharge.  He was noted to have significant decline post admission due to right wrist and foot pain. CT head was repeated and was negative for bleed or acute changes.   X-rays done revealing degenerative changes.  Gabapentin was added for pain management without improvement and patient was felt to have gout flare therefore was treated with course of steroids with improvement in pain and symptoms.  However with discontinuation of steroids, he developed left foot pain impacting overall mobility.  Steroids were resumed with slow taper to be completed over the next week.  Swallow function improved he was advanced to regular textures and is tolerating this without difficulty.  He has been making steady  progress but has been limited by issues with pain.  He currently requires min assist with ADLs and mobility.  He will continue to receive further follow-up home health PT, OT and ST by advanced home care after discharge.   Rehab course: During patient's stay in rehab weekly team conferences were held to monitor patient's progress, set goals and discuss barriers to discharge. At admission, patient required max to total assist with basic self-care task and max assist with mobility. He exhibited mild oral dysphagia with residue and moderate  cognitive deficits with decrease in orientation. He  has had improvement in activity tolerance, balance, postural control as well as ability to compensate for deficits. He has had improvement in functional use RUE  and RLE as well as improvement in awareness.  He is able to complete ADL tasks with min assist. He is able to utilize safe swallow strategies at modified independent level and is tolerating regular textures/thin liquids with minimal s/s of aspiration.  He requires mod verbal cues to complete functional familiar tasks.  Family education has been completed regarding all aspects of cognitive linguistic function as well as mobility.   Disposition: Home  Diet: Heart Healthy.   Special Instructions: 1.  Increase fluid intake.  Repeat BMET in 1-2 weeks to monitor renal status.. 2.   Discharge Instructions    Ambulatory referral to Physical Medicine Rehab   Complete by: As directed    1-2 weeks TC appt     Allergies as of 07/18/2020   No Known Allergies     Medication List    STOP taking these medications   amLODipine 10 MG tablet Commonly known as: NORVASC   senna-docusate 8.6-50 MG tablet Commonly known as: Senokot-S     TAKE these medications   acetaminophen 325 MG tablet Commonly known as: TYLENOL Take 1-2 tablets (325-650 mg total) by mouth every 4 (four) hours as needed for mild pain.   aspirin EC 81 MG tablet Take 1 tablet (81 mg total) by mouth daily.   atorvastatin 40 MG tablet Commonly known as: LIPITOR Take 1 tablet (40 mg total) by mouth daily.   citalopram 20 MG tablet Commonly known as: CELEXA Take 1 tablet (20 mg total) by mouth daily.   colchicine 0.6 MG tablet Take 0.6 mg by mouth 2 (two) times daily as needed.   diclofenac Sodium 1 % Gel Commonly known as: VOLTAREN Apply 2 g topically 4 (four) times daily.   gabapentin 100 MG capsule Commonly known as: NEURONTIN Take 1 capsule (100 mg total) by mouth 2 (two) times daily.   hydrocerin  Crea Apply 1 application topically 2 (two) times daily.   lisinopril 10 MG tablet Commonly known as: ZESTRIL Take 1 tablet (10 mg total) by mouth daily. Start taking on: July 19, 2020 What changed:   medication strength  how much to take   multivitamin with minerals Tabs tablet Take 1 tablet by mouth daily. Start taking on: July 19, 2020   pantoprazole 40 MG tablet Commonly known as: PROTONIX Take 1 tablet (40 mg total) by mouth 2 (two) times daily.   predniSONE 10 MG tablet Commonly known as: DELTASONE Take one pill daily for 6 days. Starting next Wednesday decrease pill to 1/2 pill per day till gone.   tamsulosin 0.4 MG Caps capsule Commonly known as: FLOMAX Take 1 capsule (0.4 mg total) by mouth daily.       Follow-up Information    Raulkar, Clide Deutscher, MD Follow up.   Specialty:  Physical Medicine and Rehabilitation Why: Office will call you with follow up appt Contact information: [address] [place] [address] [phone]        [facility] Follow up.   Why: for post stroke follow  up Contact information: [address] [place]       [name], MD. Call on 07/19/2020.   Specialty: Internal Medicine Why: for post hospital follow up Contact information: [id number] [place] [address] [phone]        Ladene Artist, MD. Call.   Specialty: Gastroenterology Why: for follow up in 4-5 weeks.  Contact information: [address] [place] [address] [phone]               Signed: Bary Leriche 07/18/2020, 11:20 AM

## 2020-07-18 NOTE — Progress Notes (Signed)
Patient ID: Andres White, male   DOB: Jan 30, 1946, 74 y.o.   MRN: [id number]  SW received updates from [facility] that referral was declined due to staffing.  SW called [name] [phone]) to inform on challenges,and will continue to explore HHA.SW reminded her that pt has HEP and will follow-up once there is an agency in place.   SW sent referral again to [place]. SW waiting on updates from [facility].   *SW later received updates after patient discharge that referral for HHPT/OT/SLP/aide was accepted, and start of care will be on Monday. SW called [name] to inform on above and stated the agency will call her.   Loralee Pacas, MSW, [place] Office: [phone] Cell: [phone] Fax: [phone]

## 2020-07-20 ENCOUNTER — Telehealth: Payer: Self-pay

## 2020-07-20 ENCOUNTER — Telehealth: Payer: Self-pay | Admitting: *Deleted

## 2020-07-20 NOTE — Telephone Encounter (Signed)
Spoke with [name] regarding the patients Zio monitor. The patient's daughter [name]) stated, "the patient is still wearing the monitor." I told the daughter that the patient was only to wear for 7 days. I gave instructions to the daughter [name]) to remove the monitor from the patient and return the monitor in the [place] box that was given to them at the time of placement.

## 2020-07-20 NOTE — Telephone Encounter (Signed)
Transitional Care call--I spoke with his daughter [name]    1. Are you/is patient experiencing any problems since coming home? Are there any questions regarding any aspect of care? NO 2. Are there any questions regarding medications administration/dosing? Are meds being taken as prescribed? Patient should review meds with caller to confirm [name] confirms she got all the medications on his list 3. Have there been any falls? NO 4. Has Home Health been to the house and/or have they contacted you? If not, have you tried to contact them? Can we help you contact them? NO they are scheduled to start care 07/23/20 [place] 5. Are bowels and bladder emptying properly? Are there any unexpected incontinence issues? If applicable, is patient following bowel/bladder programs? NO 6. Any fevers, problems with breathing, unexpected pain? NO 7. Are there any skin problems or new areas of breakdown? NO 8. Has the patient/family member arranged specialty MD follow up (ie cardiology/neurology/renal/surgical/etc)?  Can we help arrange? I have given her the appt to see Danella Sensing NP for Dr Ranell Patrick on 07/25/20 and reviewed the other physicians per the discharge note she should call for appts. 9. Does the patient need any other services or support that we can help arrange? NO 10. Are caregivers following through as expected in assisting the patient? YES, He is going to be staying with his daughter [name] in [place] (his home is in [place]) [place] went to his home so I have reminded her to please arrive early for his appt so they can fill out papers. 11. Has the patient quit smoking, drinking alcohol, or using drugs as recommended?N/A  Appointment Wednesday 07/25/20 @ 1:00 arrive by 12:30 to see Danella Sensing NP then back to [facility] Address reviewed. Packet went to his home address in [place] and it is too late to mail one to her address in [place] [address] [phone]

## 2020-07-23 ENCOUNTER — Telehealth: Payer: Self-pay

## 2020-07-23 NOTE — Telephone Encounter (Signed)
Per protocol hospital discharge notes reviewed: Verbal okay given to [facility] PT with [place].  For home visits twice a week for 2 week for 1 one, once a week for one week, then twice a week for one week. To address balance, independence, muscle weakness & mobility endurance.    Patient has decline PA because the family can bath him.   Call back phone [phone]

## 2020-07-25 ENCOUNTER — Encounter: Payer: Medicare Other | Attending: Registered Nurse | Admitting: Registered Nurse

## 2020-07-26 ENCOUNTER — Telehealth: Payer: Self-pay | Admitting: Gastroenterology

## 2020-07-26 NOTE — Telephone Encounter (Signed)
For elective endoscopic procedures we recommend waiting at least 3 months after a CVA.   [facility] hospital consult note indicates he has his GI care at [facility] and if he wants to continue at [facility] please arrange GI follow up there.  If he wants to have his GI care with [place] then: Please arrange an outpatient office appt with Dr. 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

## 2020-07-26 NOTE — Telephone Encounter (Signed)
Letter states duodenal polyp should be removed in the next few months, although recall is for 12/2020.  Can he schedule now?

## 2020-07-26 NOTE — Telephone Encounter (Signed)
I spoke with the daughter and she would like to have his GI care remain here in [place]. Patient has been scheduled for an office visit on 09/28/20 to discuss duodenal polypectomy.  Daughter aware I will call her back once the March schedule is available to set up repeat colonoscopy.

## 2020-07-26 NOTE — Telephone Encounter (Signed)
Pt's daughter is requesting a call back from a nurse to discuss the letter the pt received for his EGD 12/2020, caller wants to know why this has to wait so long.

## 2020-08-01 ENCOUNTER — Ambulatory Visit: Payer: Medicare Other | Admitting: Internal Medicine

## 2020-08-02 ENCOUNTER — Telehealth: Payer: Self-pay

## 2020-08-02 ENCOUNTER — Ambulatory Visit: Payer: Medicare Other

## 2020-08-02 ENCOUNTER — Encounter: Payer: Self-pay | Admitting: Internal Medicine

## 2020-08-02 NOTE — Telephone Encounter (Signed)
Spoke with patients daughter (okay per DPR)  Made her aware we have the monitor results and that Dr. Okey Dupre stated patient will need to be seen relatively soon. Patients daughter confirmed the appointment they have for Monday 08/07/2019 at the [facility] office.  With Dr. Okey Dupre beside me he wanted me to see if patient was having any symptoms and let the daughter know if symptoms worsen he will need to be taken back to the ER.   Patients daughter was very understanding.

## 2020-08-02 NOTE — Telephone Encounter (Signed)
Contacted [employer] regarding wrong DOB that was documented on patient ZIO report. Spoke with Brittany(would not provide last name) and she confirmed the correct DOB was submitted when monitor was placed-1946/04/21. She has placed a request for a new report to be uploaded,

## 2020-08-02 NOTE — Telephone Encounter (Signed)
Patient's event monitor presented to me as DOD.  Monitor was placed at the request of the hospitalist provider while the patient was hospitalized for stroke last month.  He was not seen by cardiology during that admission.  Monitor shows predominantly sinus rhythm with rare PACs and occasional PVCs.  Multiple episodes of wide-complex tachycardia are seen, lasting up to 39 seconds.  Episodes are concerning for ventricular tachycardia though atrial runs with aberrancy cannot be entirely excluded.  Patient's family was contacted and reports that he has stable shortness of breath and dizziness, having been discharged from inpatient rehab.  He was scheduled to be seen in our office yesterday but canceled this visit.  He now has an appointment with Dr. Flora Lipps in our [place] office next week.  Paper report will be scanned into Epic (media tab), as connectivity issues preclude final interpretation of the report in Epic.  Patient's family member advised to have Andres White go to the ER if he has worsening dyspnea or recurrent dizziness.  Yvonne Kendall, MD [facility]

## 2020-08-05 NOTE — Progress Notes (Signed)
Cardiology Office Note:   Date:  08/06/2020  NAME:  Andres White    MRN: [id number] DOB:  07-22-1946   PCP:  Cain Sieve, MD  Cardiologist:  No primary care provider on file.   Referring MD: Cain Sieve,*   Chief Complaint  Patient presents with  . Irregular Heart Beat        History of Present Illness:   Andres White is a 75 y.o. male with a hx of stroke, HTN, HLD who is being seen today for the evaluation of ventricular tachycardia at the request of Cain Sieve, MD.  Recent admission in November of this year for left thalamic stroke.  This appears to have been a small vessel stroke.  He also completed rehabilitation.  He was sent home with a monitor and found to have ventricular tachycardia episodes.  He did have sustained ventricular tachycardia for 39 seconds at a rate of 127 bpm.  There were fusion complexes present.  He also had some fast nonsustained episodes lasting roughly 13 seconds.  His PVC burden was roughly 1.6%.  His blood pressure today is 84/57.  He has been on blood pressure medications despite having low blood pressure.  He presents with his daughter.  He does have deficits and uses a wheelchair.  He describes no episodes of chest pain or shortness of breath.  There have been no syncope reported.  He reports he can feel his heart racing but this is mainly when he tries to do activity.  Blood pressure has been very low at home.  He denies any fevers chills cough.  No infectious symptoms reported.  No pain with urination.  He is still smoking 2 cigarettes/day.  Medical history significant for longstanding hypertension.  Most recent cholesterol well controlled.  I also reviewed his carotid ultrasounds.  There is mention of a 70 to 99% stenosis in the left ICA.  He has no bruit on examination.  I think this is overestimated and this was mentioned in the report.  I also reviewed his echocardiogram from the hospital.  He had normal LV function.  There  is no evidence of prior infarction.   Zio 08/02/2020 (My review) Minimum heart rate 39 bpm (sinus bradycardia) Maximum sinus rate 146 bpm.  158 Ventricular tachycardia episodes occurred (fastest 12.8 seconds ~187 bpm and longest 39 seconds ~127 bpm) -fusion complexes present -monomorphic  -1.6% PVC burden Atrial tachycardia detected up to 3.8 seconds No atrial fibrillation  Problem List 1. HTN 2. 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 Tobacco abuse 4. HLD -T chol 96, LDL 32, HDL47, TG 87 -A1c 4.8 5. Carotid Artery Disease -L ICA 70-99% (overestimated) 6. Ventricular tachycardia -captured on monitor; no symptoms; monomorphic  -EF 55-60%   Past Medical History: Past Medical History:  Diagnosis Date  . Arthritis   . Chronic back pain   . Gout   . Hypertension   . Syncope and collapse     Past Surgical History: Past Surgical History:  Procedure Laterality Date  . BIOPSY  07/13/2020   Procedure: BIOPSY;  Surgeon: Meryl Dare, MD;  Location: [facility] ENDOSCOPY;  Service: Endoscopy;;  . CARDIAC CATHETERIZATION  2015   [facility]   . COLONOSCOPY WITH PROPOFOL N/A 07/13/2020   Procedure: COLONOSCOPY WITH PROPOFOL;  Surgeon: Meryl Dare, MD;  Location[facility] ENDOSCOPY;  Service: Endoscopy;  Laterality: N/A;  . ESOPHAGOGASTRODUODENOSCOPY N/A 07/13/2020   Procedure: ESOPHAGOGASTRODUODENOSCOPY (EGD);  Surgeon: Meryl Dare, MD;  Location:  [place] ENDOSCOPY;  Service: Endoscopy;  Laterality: N/A;  . KNEE SURGERY    . POLYPECTOMY  07/13/2020   Procedure: POLYPECTOMY;  Surgeon: Ladene Artist, MD;  Location: [facility] ENDOSCOPY;  Service: Endoscopy;;    Current Medications: Current Meds  Medication Sig  . atorvastatin (LIPITOR) 20 MG tablet Take 40 mg by mouth daily.  . metoprolol succinate (TOPROL-XL) 25 MG 24 hr tablet Take 1 tablet (25 mg total) by mouth daily. Take with or immediately following a meal.  . [DISCONTINUED] carvedilol (COREG) 3.125 MG tablet Take 3.125 mg by  mouth 2 (two) times daily with a meal.     Allergies:    Patient has no known allergies.   Social History: Social History   Socioeconomic History  . Marital status: Single    Spouse name: Not on file  . Number of children: Not on file  . Years of education: Not on file  . Highest education level: Not on file  Occupational History  . Not on file  Tobacco Use  . Smoking status: Current Every Day Smoker    Years: 20.00    Types: Cigarettes  . Smokeless tobacco: Never Used  Vaping Use  . Vaping Use: Never used  Substance and Sexual Activity  . Alcohol use: Yes    Comment: occasional  . Drug use: No  . Sexual activity: Not on file  Other Topics Concern  . Not on file  Social History Narrative  . Not on file   Social Determinants of Health   Financial Resource Strain: Not on file  Food Insecurity: Not on file  Transportation Needs: Not on file  Physical Activity: Not on file  Stress: Not on file  Social Connections: Not on file     Family History: The patient's family history includes Hypertension in his mother.  ROS:   All other ROS reviewed and negative. Pertinent positives noted in the HPI.     EKGs/Labs/Other Studies Reviewed:   The following studies were personally reviewed by me today:  EKG:  EKG is ordered today.  The ekg ordered today demonstrates sinus tachycardia, heart rate 102, old anterior infarct, PVC noted, and was personally reviewed by me.   Zio 08/02/2020 (My review) Minimum heart rate 39 bpm (sinus bradycardia) Maximum sinus rate 146 bpm.  158 Ventricular tachycardia episodes occurred (fastest 12.8 seconds ~187 bpm and longest 39 seconds ~127 bpm) -fusion complexes present -monomorphic  -<1.6% PVC burden Atrial tachycardia detected up to 3.8 seconds No atrial fibrillation  Recent Labs: 06/28/2020: ALT 30 07/16/2020: BUN 33; Creatinine, Ser 1.47; Potassium 4.1; Sodium 132 07/17/2020: Hemoglobin 9.0; Platelets 285   Recent Lipid Panel     Component Value Date/Time   CHOL 96 06/25/2020 0441   TRIG 87 06/25/2020 0441   HDL 47 06/25/2020 0441   CHOLHDL 2.0 06/25/2020 0441   VLDL 17 06/25/2020 0441   LDLCALC 32 06/25/2020 0441    Physical Exam:   VS:  BP (!) 84/57   Pulse (!) 102   Ht 5\' 7"  (1.702 m)   Wt 187 lb (84.8 kg)   SpO2 98%   BMI 29.29 kg/m    Wt Readings from Last 3 Encounters:  08/06/20 187 lb (84.8 kg)  07/13/20 187 lb 6.3 oz (85 kg)  06/24/20 164 lb (74.4 kg)    General: Well nourished, well developed, in no acute distress Head: Atraumatic, normal size  Eyes: PEERLA, EOMI  Neck: Supple, no JVD Endocrine: No thryomegaly Cardiac: Normal S1, S2; RRR;  no murmurs, rubs, or gallops Lungs: Clear to auscultation bilaterally, no wheezing, rhonchi or rales  Abd: Soft, nontender, no hepatomegaly  Ext: No edema, pulses 2+ Musculoskeletal: No deformities, BUE and BLE strength normal and equal Skin: Warm and dry, no rashes   Neuro: Alert and oriented to person, place, time, and situation, CNII-XII grossly intact, no focal deficits  Psych: Normal mood and affect   ASSESSMENT:   Andres White is a 75 y.o. male who presents for the following: 1. Ventricular tachycardia ([place])   2. Primary hypertension   3. Mixed hyperlipidemia   4. Left carotid stenosis     PLAN:   1. Ventricular tachycardia ([place]) -Recent monitor for extended A. fib monitoring after recent stroke demonstrated sustained ventricular tachycardia.  Most of his episodes were brief less than 30 seconds.  Longest episode of 39 seconds.  On my review they appear monomorphic.  There are fusion complexes as well.  He describes no symptoms of this.  No syncope. -Echocardiogram in the hospital shows normal LV function and no wall motion normalities concerning for prior infarction. -1.6% PVC burden. -He is without any major symptoms from this.  I recommended a nuclear medicine stress test.  He had a recent stroke and is requiring a wheelchair.  He is  not that active.  I do not think a left heart catheterization is warranted at this time. Episodes are monomorphic. We will restratify him with a nuclear medicine stress test. -There is no increased wall thickness or any abnormal wall motion normality suggest underlying condition such as sarcoidosis.  His EKG today also demonstrates PVCs with old anteroseptal infarct. -In the interim we will switch him over to metoprolol succinate.  We will stop his Coreg and lisinopril.  Hopefully we can get a better PVC suppression with this.  2. Primary hypertension -BP quite low.  Stop lisinopril and Coreg.  Start metoprolol succinate for ventricular tachycardia as above.  3. Mixed hyperlipidemia -Most recent LDL cholesterol at goal.  Continue Lipitor 40 mg a day.  His most recent LDL is 32.  4. Left carotid stenosis -Mention of 70 to 99% stenosis in the left ICA.  No bruit.  I do mention this is overestimated.  I highly suspect this is just over called.  We will plan to repeat a carotid ultrasound next year.  If stroke was a small vessel stroke (thalamic).  Given that his stroke was lacunar in nature I see no need for further extended A. fib monitoring.  Disposition: Return in about 3 months (around 11/04/2020).  Medication Adjustments/Labs and Tests Ordered: Current medicines are reviewed at length with the patient today.  Concerns regarding medicines are outlined above.  Orders Placed This Encounter  Procedures  . MYOCARDIAL PERFUSION IMAGING  . EKG 12-Lead   Meds ordered this encounter  Medications  . metoprolol succinate (TOPROL-XL) 25 MG 24 hr tablet    Sig: Take 1 tablet (25 mg total) by mouth daily. Take with or immediately following a meal.    Dispense:  90 tablet    Refill:  1    Patient Instructions  Medication Instructions:  Stop Carvedilol  Stop Lisinopril Start Metoprolol Succinate 25 mg daily   *If you need a refill on your cardiac medications before your next appointment, please  call your pharmacy*   Testing/Procedures: Your physician has requested that you have a lexiscan myoview. A cardiac stress test is a cardiological test that measures the heart's ability to respond to external stress in  a controlled clinical environment. The stress response is induced by intravenous pharmacological stimulation.  Follow-Up: At [facility], you and your health needs are our priority.  As part of our continuing mission to provide you with exceptional heart care, we have created designated Provider Care Teams.  These Care Teams include your primary Cardiologist (physician) and Advanced Practice Providers (APPs -  Physician Assistants and Nurse Practitioners) who all work together to provide you with the care you need, when you need it.  We recommend signing up for the patient portal called "MyChart".  Sign up information is provided on this After Visit Summary.  MyChart is used to connect with patients for Virtual Visits (Telemedicine).  Patients are able to view lab/test results, encounter notes, upcoming appointments, etc.  Non-urgent messages can be sent to your provider as well.   To learn more about what you can do with MyChart, go to [email or web address].    Your next appointment:   3 month(s)  The format for your next appointment:   In Person  Provider:   Eleonore Chiquito, MD     Signed, Addison Naegeli. Audie Box, [place]  [address], [place], [place] [address] [phone]  08/06/2020 5:01 PM

## 2020-08-06 ENCOUNTER — Encounter: Payer: Self-pay | Admitting: Cardiovascular Disease

## 2020-08-06 ENCOUNTER — Ambulatory Visit (INDEPENDENT_AMBULATORY_CARE_PROVIDER_SITE_OTHER): Payer: Medicare Other | Admitting: Cardiovascular Disease

## 2020-08-06 ENCOUNTER — Other Ambulatory Visit: Payer: Self-pay

## 2020-08-06 VITALS — BP 84/57 | HR 102 | Ht 67.0 in | Wt 187.0 lb

## 2020-08-06 DIAGNOSIS — I10 Essential (primary) hypertension: Secondary | ICD-10-CM | POA: Diagnosis not present

## 2020-08-06 DIAGNOSIS — I472 Ventricular tachycardia, unspecified: Secondary | ICD-10-CM

## 2020-08-06 DIAGNOSIS — E782 Mixed hyperlipidemia: Secondary | ICD-10-CM

## 2020-08-06 DIAGNOSIS — I6522 Occlusion and stenosis of left carotid artery: Secondary | ICD-10-CM | POA: Diagnosis not present

## 2020-08-06 MED ORDER — METOPROLOL SUCCINATE ER 25 MG PO TB24: 25.0000 mg | ORAL_TABLET | Freq: Every day | ORAL | 1 refills | Status: DC

## 2020-08-06 NOTE — Patient Instructions (Signed)
Medication Instructions:  Stop Carvedilol  Stop Lisinopril Start Metoprolol Succinate 25 mg daily   *If you need a refill on your cardiac medications before your next appointment, please call your pharmacy*   Testing/Procedures: Your physician has requested that you have a lexiscan myoview. A cardiac stress test is a cardiological test that measures the heart's ability to respond to external stress in a controlled clinical environment. The stress response is induced by intravenous pharmacological stimulation.  Follow-Up: At [facility], you and your health needs are our priority.  As part of our continuing mission to provide you with exceptional heart care, we have created designated Provider Care Teams.  These Care Teams include your primary Cardiologist (physician) and Advanced Practice Providers (APPs -  Physician Assistants and Nurse Practitioners) who all work together to provide you with the care you need, when you need it.  We recommend signing up for the patient portal called "MyChart".  Sign up information is provided on this After Visit Summary.  MyChart is used to connect with patients for Virtual Visits (Telemedicine).  Patients are able to view lab/test results, encounter notes, upcoming appointments, etc.  Non-urgent messages can be sent to your provider as well.   To learn more about what you can do with MyChart, go to [email or web address].    Your next appointment:   3 month(s)  The format for your next appointment:   In Person  Provider:   Lennie Odor, MD

## 2020-08-08 ENCOUNTER — Ambulatory Visit (HOSPITAL_COMMUNITY): Payer: Medicare Other | Attending: Cardiovascular Disease

## 2020-08-08 ENCOUNTER — Other Ambulatory Visit: Payer: Self-pay | Admitting: *Deleted

## 2020-08-08 ENCOUNTER — Other Ambulatory Visit: Payer: Self-pay

## 2020-08-08 DIAGNOSIS — R0609 Other forms of dyspnea: Secondary | ICD-10-CM | POA: Diagnosis not present

## 2020-08-08 DIAGNOSIS — I10 Essential (primary) hypertension: Secondary | ICD-10-CM | POA: Diagnosis not present

## 2020-08-08 DIAGNOSIS — R0600 Dyspnea, unspecified: Secondary | ICD-10-CM | POA: Diagnosis not present

## 2020-08-08 DIAGNOSIS — I472 Ventricular tachycardia, unspecified: Secondary | ICD-10-CM

## 2020-08-08 DIAGNOSIS — I471 Supraventricular tachycardia: Secondary | ICD-10-CM

## 2020-08-08 LAB — MYOCARDIAL PERFUSION IMAGING
LV dias vol: 54 mL (ref 62–150)
LV sys vol: 22 mL
Peak HR: 82 {beats}/min
Rest HR: 71 {beats}/min
SDS: 1
SRS: 0
SSS: 1
TID: 0.95

## 2020-08-08 MED ORDER — TECHNETIUM TC 99M TETROFOSMIN IV KIT
31.9000 | PACK | Freq: Once | INTRAVENOUS | Status: AC | PRN
  Administered 2020-08-08: 31.9 via INTRAVENOUS
  Filled 2020-08-08: qty 32

## 2020-08-08 MED ORDER — TECHNETIUM TC 99M TETROFOSMIN IV KIT
10.2000 | PACK | Freq: Once | INTRAVENOUS | Status: AC | PRN
  Administered 2020-08-08: 10.2 via INTRAVENOUS
  Filled 2020-08-08: qty 11

## 2020-08-08 MED ORDER — REGADENOSON 0.4 MG/5ML IV SOLN
0.4000 mg | Freq: Once | INTRAVENOUS | Status: AC
Start: 2020-08-08 — End: 2020-08-08
  Administered 2020-08-08: 0.4 mg via INTRAVENOUS

## 2020-08-10 ENCOUNTER — Telehealth: Payer: Self-pay | Admitting: *Deleted

## 2020-08-10 NOTE — Telephone Encounter (Signed)
Andres White called for POC 1wk1, 2wk4, 1wk2.  Approval given.

## 2020-08-13 ENCOUNTER — Telehealth: Payer: Self-pay | Admitting: *Deleted

## 2020-08-13 NOTE — Telephone Encounter (Signed)
Brandon OT [facility] called for POC 1wk1, 2wk3,1wk1.  Approval given.

## 2020-08-15 ENCOUNTER — Telehealth: Payer: Self-pay

## 2020-08-15 NOTE — Telephone Encounter (Signed)
Claiborne Billings, PT from [facility] called requesting verbal orders for [facility] 2wk3, 1wk3. Orders approved and given.

## 2020-08-20 ENCOUNTER — Other Ambulatory Visit: Payer: Self-pay | Admitting: Physical Medicine and Rehabilitation

## 2020-08-23 ENCOUNTER — Telehealth: Payer: Self-pay | Admitting: *Deleted

## 2020-08-23 NOTE — Telephone Encounter (Signed)
Shelda Pal [facility] called to report that Mr Rueth will have 2 missed visits this week due to cancellation by family due to illness in the home.

## 2020-08-24 ENCOUNTER — Other Ambulatory Visit: Payer: Self-pay | Admitting: Physical Medicine and Rehabilitation

## 2020-08-24 NOTE — Telephone Encounter (Signed)
Called pharmacy and advised to send refill request to patient PCP. Declined refill.

## 2020-08-27 ENCOUNTER — Telehealth: Payer: Self-pay

## 2020-08-27 NOTE — Telephone Encounter (Signed)
I spoke with the daughter today and rescheduled colonoscopy with her and pre-visit for 10/30/20 and 10/16/20

## 2020-08-27 NOTE — Telephone Encounter (Signed)
-----   Message from Marlon Pel, RN sent at 07/26/2020 11:47 AM EST ----- Call daughter and arrange colon with [facility] for March with double prep.  See phone notes from 07/26/20

## 2020-08-28 ENCOUNTER — Other Ambulatory Visit: Payer: Self-pay | Admitting: Cardiovascular Disease

## 2020-08-30 ENCOUNTER — Ambulatory Visit (INDEPENDENT_AMBULATORY_CARE_PROVIDER_SITE_OTHER): Payer: Medicare Other | Admitting: Neurology

## 2020-08-30 ENCOUNTER — Telehealth: Payer: Self-pay | Admitting: Neurology

## 2020-08-30 ENCOUNTER — Encounter: Payer: Self-pay | Admitting: Neurology

## 2020-08-30 VITALS — BP 142/84 | Ht 67.0 in | Wt 178.4 lb

## 2020-08-30 DIAGNOSIS — I6381 Other cerebral infarction due to occlusion or stenosis of small artery: Secondary | ICD-10-CM | POA: Diagnosis not present

## 2020-08-30 DIAGNOSIS — I69311 Memory deficit following cerebral infarction: Secondary | ICD-10-CM | POA: Diagnosis not present

## 2020-08-30 DIAGNOSIS — G8191 Hemiplegia, unspecified affecting right dominant side: Secondary | ICD-10-CM | POA: Diagnosis not present

## 2020-08-30 NOTE — Patient Instructions (Signed)
I had a long discussion the patient and his daughter regarding his recent lacunar stroke and right hemiparesis as well as post stroke memory loss and cognitive impairment and answered questions.  I recommend further evaluation by checking memory panel labs, EEG, CT angiogram of the brain and neck.  Continue aspirin for stroke prevention and maintain aggressive risk factor modification strict control of hypertension blood pressure goal below 130/90, lipids with LDL cholesterol goal below 70 mg percent and diabetes with hemoglobin A1c goal below 6.5%.  I advised the patient also to quit smoking completely and he is agreeable.  Continue ongoing home physical and occupational therapy for gait and balance.  I also encouraged him to increase participation in cognitively challenging activities like solving crossword puzzles, playing bridge and sodoku.  We also discussed memory compensation strategies.  Will return for follow-up in the future in 3 months or call earlier if necessary. Memory Compensation Strategies  1. Use "WARM" strategy.  W= write it down  A= associate it  R= repeat it  M= make a mental note  2.   You can keep a Social worker.  Use a 3-ring notebook with sections for the following: calendar, important names and phone numbers,  medications, doctors' names/phone numbers, lists/reminders, and a section to journal what you did  each day.   3.    Use a calendar to write appointments down.  4.    Write yourself a schedule for the day.  This can be placed on the calendar or in a separate section of the Memory Notebook.  Keeping a  regular schedule can help memory.  5.    Use medication organizer with sections for each day or morning/evening pills.  You may need help loading it  6.    Keep a basket, or pegboard by the door.  Place items that you need to take out with you in the basket or on the pegboard.  You may also want to  include a message board for reminders.  7.    Use sticky  notes.  Place sticky notes with reminders in a place where the task is performed.  For example: " turn off the  stove" placed by the stove, "lock the door" placed on the door at eye level, " take your medications" on  the bathroom mirror or by the place where you normally take your medications.  8.    Use alarms/timers.  Use while cooking to remind yourself to check on food or as a reminder to take your medicine, or as a  reminder to make a call, or as a reminder to perform another task, etc.  Stroke Prevention Some medical conditions and behaviors are associated with a higher chance of having a stroke. You can help prevent a stroke by making nutrition, lifestyle, and other changes, including managing any medical conditions you may have. What nutrition changes can be made?  Eat healthy foods. You can do this by: ? Choosing foods high in fiber, such as fresh fruits and vegetables and whole grains. ? Eating at least 5 or more servings of fruits and vegetables a day. Try to fill half of your plate at each meal with fruits and vegetables. ? Choosing lean protein foods, such as lean cuts of meat, poultry without skin, fish, tofu, beans, and nuts. ? Eating low-fat dairy products. ? Avoiding foods that are high in salt (sodium). This can help lower blood pressure. ? Avoiding foods that have saturated fat, trans fat, and cholesterol.  This can help prevent high cholesterol. ? Avoiding processed and premade foods.  Follow your health care provider's specific guidelines for losing weight, controlling high blood pressure (hypertension), lowering high cholesterol, and managing diabetes. These may include: ? Reducing your daily calorie intake. ? Limiting your daily sodium intake to 1,500 milligrams (mg). ? Using only healthy fats for cooking, such as olive oil, canola oil, or sunflower oil. ? Counting your daily carbohydrate intake.   What lifestyle changes can be made?  Maintain a healthy weight. Talk  to your health care provider about your ideal weight.  Get at least 30 minutes of moderate physical activity at least 5 days a week. Moderate activity includes brisk walking, biking, and swimming.  Do not use any products that contain nicotine or tobacco, such as cigarettes and e-cigarettes. If you need help quitting, ask your health care provider. It may also be helpful to avoid exposure to secondhand smoke.  Limit alcohol intake to no more than 1 drink a day for nonpregnant women and 2 drinks a day for men. One drink equals 12 oz of beer, 5 oz of wine, or 1 oz of hard liquor.  Stop any illegal drug use.  Avoid taking birth control pills. Talk to your health care provider about the risks of taking birth control pills if: ? You are over 33 years old. ? You smoke. ? You get migraines. ? You have ever had a blood clot. What other changes can be made?  Manage your cholesterol levels. ? Eating a healthy diet is important for preventing high cholesterol. If cholesterol cannot be managed through diet alone, you may also need to take medicines. ? Take any prescribed medicines to control your cholesterol as told by your health care provider.  Manage your diabetes. ? Eating a healthy diet and exercising regularly are important parts of managing your blood sugar. If your blood sugar cannot be managed through diet and exercise, you may need to take medicines. ? Take any prescribed medicines to control your diabetes as told by your health care provider.  Control your hypertension. ? To reduce your risk of stroke, try to keep your blood pressure below 130/80. ? Eating a healthy diet and exercising regularly are an important part of controlling your blood pressure. If your blood pressure cannot be managed through diet and exercise, you may need to take medicines. ? Take any prescribed medicines to control hypertension as told by your health care provider. ? Ask your health care provider if you should  monitor your blood pressure at home. ? Have your blood pressure checked every year, even if your blood pressure is normal. Blood pressure increases with age and some medical conditions.  Get evaluated for sleep disorders (sleep apnea). Talk to your health care provider about getting a sleep evaluation if you snore a lot or have excessive sleepiness.  Take over-the-counter and prescription medicines only as told by your health care provider. Aspirin or blood thinners (antiplatelets or anticoagulants) may be recommended to reduce your risk of forming blood clots that can lead to stroke.  Make sure that any other medical conditions you have, such as atrial fibrillation or atherosclerosis, are managed. What are the warning signs of a stroke? The warning signs of a stroke can be easily remembered as BEFAST.  B is for balance. Signs include: ? Dizziness. ? Loss of balance or coordination. ? Sudden trouble walking.  E is for eyes. Signs include: ? A sudden change in vision. ? Trouble seeing.  F is for face. Signs include: ? Sudden weakness or numbness of the face. ? The face or eyelid drooping to one side.  A is for arms. Signs include: ? Sudden weakness or numbness of the arm, usually on one side of the body.  S is for speech. Signs include: ? Trouble speaking (aphasia). ? Trouble understanding.  T is for time. ? These symptoms may represent a serious problem that is an emergency. Do not wait to see if the symptoms will go away. Get medical help right away. Call your local emergency services (911 in the [place] Do not drive yourself to the hospital.  Other signs of stroke may include: ? A sudden, severe headache with no known cause. ? Nausea or vomiting. ? Seizure. Where to find more information For more information, visit:  American Stroke Association: www.strokeassociation.org  National Stroke Association: www.stroke.org Summary  You can prevent a stroke by eating healthy,  exercising, not smoking, limiting alcohol intake, and managing any medical conditions you may have.  Do not use any products that contain nicotine or tobacco, such as cigarettes and e-cigarettes. If you need help quitting, ask your health care provider. It may also be helpful to avoid exposure to secondhand smoke.  Remember BEFAST for warning signs of stroke. Get help right away if you or a loved one has any of these signs. This information is not intended to replace advice given to you by your health care provider. Make sure you discuss any questions you have with your health care provider. Document Revised: 07/03/2017 Document Reviewed: 08/26/2016 Elsevier Patient Education  2021 [employer].

## 2020-08-30 NOTE — Progress Notes (Signed)
[facility] [address] [place]. [place] [address] [phone]       OFFICE CONSULT NOTE  [name] Date of Birth:  1945/11/10 Medical Record Number:  [id number]   Referring MD: Reesa Chew, PA-C Reason for Referral: Stroke  HPI:  ROS:   14 system review of systems is positive for weakness, memory loss, and attention difficulties, imbalance, dysarthria, all other systems negative.  [name] is a 75 year old African-American male seen today for initial office consultation visit for stroke.  Is accompanied by his daughter who provides history.  I also reviewed electronic medical records and imaging films in PACS.  He has a past medical history of hypertension, tobacco abuse, arthritis who presented to [facility] on 06/25/2020 with right leg weakness for 4 days prior to admission.  He was found to have mild right hand and more leg weakness.  MRI scan of the brain showed a left midbrain lacunar infarct.  Carotid ultrasound suggested moderate 70 to 90% left ICA stenosis which is asymptomatic.  Echocardiogram showed normal ejection fraction without cardiac source of embolism.  LDL cholesterol was 32 mg percent.  Hemoglobin A1c was 4.8.  Lower extremity venous Dopplers were negative.  Patient subsequently had 2 weeks external cardiac monitor done which was negative for paroxysmal A. fib.  Patient was started on aspirin 81 mg daily which he is tolerating well without bruising or bleeding.  He went to inpatient rehab for 4 weeks and subsequently is at home living with her daughter.  Is able to ambulate independently now though he still has some diminished fine motor skills and mild right leg dragging.  His speech is improved.  He however has significant memory loss and cognitive impairment following his stroke as per the daughter.  He is quite forgetful and cannot remember recent conversations and needs constant reminders.  He was previously living  independently and managing his own affairs but daughter feels he is no longer able to do so.  Patient states he is cut back smoking significantly but still smokes a few cigarettes a day.  His blood pressure is well controlled and today it is 142/86.  There is no family Struve dementia.  Patient has no prior history of seizures or significant neurological problems. PMH:  Past Medical History:  Diagnosis Date  . Arthritis   . Chronic back pain   . Gout   . Hypertension   . Stroke ([place]) 06/2020  . Syncope and collapse     Social History:  Social History   Socioeconomic History  . Marital status: Single    Spouse name: Not on file  . Number of children: Not on file  . Years of education: Not on file  . Highest education level: Not on file  Occupational History  . Not on file  Tobacco Use  . Smoking status: Current Every Day Smoker    Years: 20.00    Types: Cigarettes  . Smokeless tobacco: Never Used  Vaping Use  . Vaping Use: Never used  Substance and Sexual Activity  . Alcohol use: Yes    Comment: occasional  . Drug use: No  . Sexual activity: Not on file  Other Topics Concern  . Not on file  Social History Narrative   Lives with daughter, son in law and 2 grandchildren   Right Handed   Drinks 1 cup caffeine 4 times a week-ish   Social Determinants of Health   Financial Resource Strain: Not on file  Food Insecurity: Not on file  Transportation Needs: Not on file  Physical Activity: Not on file  Stress: Not on file  Social Connections: Not on file  Intimate Partner Violence: Not on file    Medications:   Current Outpatient Medications on File Prior to Visit  Medication Sig Dispense Refill  . acetaminophen (TYLENOL) 325 MG tablet Take 1-2 tablets (325-650 mg total) by mouth every 4 (four) hours as needed for mild pain.    [name] aspirin EC 81 MG tablet Take 1 tablet (81 mg total) by mouth daily. 90 tablet 3  . atorvastatin (LIPITOR) 20 MG tablet Take 40 mg by mouth  daily.    . citalopram (CELEXA) 20 MG tablet Take 1 tablet (20 mg total) by mouth daily. 30 tablet 0  . colchicine 0.6 MG tablet Take 0.6 mg by mouth 2 (two) times daily as needed.    . diclofenac Sodium (VOLTAREN) 1 % GEL Apply 2 g topically 4 (four) times daily. 100 g 1  . gabapentin (NEURONTIN) 100 MG capsule Take 1 capsule (100 mg total) by mouth 2 (two) times daily. 60 capsule 0  . hydrocerin (EUCERIN) CREA Apply 1 application topically 2 (two) times daily.  0  . metoprolol succinate (TOPROL-XL) 25 MG 24 hr tablet Take 1 tablet (25 mg total) by mouth daily. Take with or immediately following a meal. 90 tablet 1  . Multiple Vitamin (MULTIVITAMIN WITH MINERALS) TABS tablet Take 1 tablet by mouth daily.    . pantoprazole (PROTONIX) 40 MG tablet Take 1 tablet (40 mg total) by mouth 2 (two) times daily. 60 tablet 1  . predniSONE (DELTASONE) 10 MG tablet Take one pill daily for 6 days. Starting next Wednesday decrease pill to 1/2 pill per day till gone. 11 tablet 0  . tamsulosin (FLOMAX) 0.4 MG CAPS capsule Take 1 capsule (0.4 mg total) by mouth daily. 30 capsule 0   No current facility-administered medications on file prior to visit.    Allergies:  No Known Allergies  Physical Exam General: well developed, well nourished, seated, in no evident distress Head: head normocephalic and atraumatic.   Neck: supple with no carotid or supraclavicular bruits Cardiovascular: regular rate and rhythm, no murmurs Musculoskeletal: no deformity Skin:  no rash/petichiae Vascular:  Normal pulses all extremities  Neurologic Exam Mental Status: Awake and fully alert. Oriented to place and time. Recent and remote memory  poor. attention span, concentration and fund of knowledge diminished.  Mini-Mental status exam scored 14/30 with deficits in orientation, attention, registration, recall, comprehension, reading, writing and following commands.  Difficulty in copying pentagons and clock drawing appropriate.  Mood and affect appropriate.  Cranial Nerves: Fundoscopic exam reveals sharp disc margins. Pupils equal, briskly reactive to light. Extraocular movements full without nystagmus. Visual fields full to confrontation. Hearing intact. Facial sensation intact. Face, tongue, palate moves normally and symmetrically.  Motor: Normal bulk and tone. Normal strength in all tested extremity muscles except mild right grip weakness and diminished fine finger movements on the right and orbits left to right upper extremity.  Mild weakness of right hip flexor and ankle dorsiflexors.. Sensory.: intact to touch , pinprick , position and vibratory sensation.  Coordination: Rapid alternating movements normal in all extremities. Finger-to-nose and heel-to-shin performed accurately bilaterally. Gait and Station: Arises from chair without difficulty. Stance is normal. Gait demonstrates slight dragging of the right leg.  Not able to heel, toe and tandem walk without difficulty.  Reflexes: 1+ and symmetric. Toes downgoing.   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  He has post stroke residual mild right hemiparesis as well as cognitive impairment and memory loss following his stroke.     PLAN: I had a long discussion the patient and his daughter regarding his recent lacunar stroke and right hemiparesis as well as post stroke memory loss and cognitive impairment and answered questions.  I recommend further evaluation by checking memory panel labs, EEG, CT angiogram of the brain and neck.  Continue aspirin for stroke prevention and maintain aggressive risk factor modification strict control of hypertension blood pressure goal below 130/90, lipids with LDL cholesterol goal below 70 mg percent and diabetes with hemoglobin A1c goal below 6.5%.  I advised  the patient also to quit smoking completely and he is agreeable.  Continue ongoing home physical and occupational therapy for gait and balance.  I also encouraged him to increase participation in cognitively challenging activities like solving crossword puzzles, playing bridge and sodoku.  We also discussed memory compensation strategies.  If patient's cognitive deficits do not resolve on follow-up visit may consider trial of medication like Aricept or Namenda will return for follow-up in the future in 3 months or call earlier if necessary.  Greater than 50% time during this prolonged 60-minute consultation was it was spent on counseling and coordination of care about his lacunar stroke, hemiparesis, memory loss and cognitive impairment and answering questions Antony Contras, MD Note: This document was prepared with digital dictation and possible smart phrase technology. Any transcriptional errors that result from this process are unintentional.

## 2020-08-30 NOTE — Telephone Encounter (Signed)
[employer] medicare order sent to GI. No auth they will reach out to the patient to schedule.  

## 2020-08-31 LAB — DEMENTIA PANEL
Homocysteine: 16.4 umol/L (ref 0.0–19.2)
RPR Ser Ql: NONREACTIVE
TSH: 3.11 u[IU]/mL (ref 0.450–4.500)
Vitamin B-12: 439 pg/mL (ref 232–1245)

## 2020-09-05 ENCOUNTER — Ambulatory Visit: Payer: Medicare Other | Admitting: Neurology

## 2020-09-05 DIAGNOSIS — I69311 Memory deficit following cerebral infarction: Secondary | ICD-10-CM

## 2020-09-05 DIAGNOSIS — R410 Disorientation, unspecified: Secondary | ICD-10-CM | POA: Diagnosis not present

## 2020-09-05 NOTE — Progress Notes (Signed)
Kindly inform the patient that lab work for reversible causes of memory loss was all satisfactory

## 2020-09-06 ENCOUNTER — Telehealth: Payer: Self-pay | Admitting: Emergency Medicine

## 2020-09-06 NOTE — Telephone Encounter (Signed)
-----   Message from Garvin Fila, MD sent at 09/05/2020  3:43 PM EST ----- Kindly inform the patient that lab work for reversible causes of memory loss was all satisfactory

## 2020-09-06 NOTE — Telephone Encounter (Signed)
FYI: Pt's daughter, [name] (on [place]) returning phone call. Informed Ms. Womack of her father's results as instructed in the telephone note. Ms. Cruz Condon expressed understood.

## 2020-09-06 NOTE — Telephone Encounter (Signed)
Left Voicemail for return call to office.

## 2020-09-07 ENCOUNTER — Telehealth: Payer: Self-pay | Admitting: *Deleted

## 2020-09-07 NOTE — Telephone Encounter (Signed)
I left her a message telling her to make sure he understand he n eeds to make an appt to be seen in order for the recertification.

## 2020-09-07 NOTE — Telephone Encounter (Signed)
Please let patient and therapist know he needs to be seen in clinic first before we can recertify need for therapy

## 2020-09-07 NOTE — Telephone Encounter (Signed)
Henderson Newcomer OT [facility] called to extend Andres White OT 9FY1 and then recert for 1wk4.  We have a problem in that he never came in for his TC visit and has not been seen.  What do you want to do about these orders?

## 2020-09-11 NOTE — Progress Notes (Signed)
Kindly inform the patient that EEG study was normal

## 2020-09-12 ENCOUNTER — Telehealth: Payer: Self-pay | Admitting: *Deleted

## 2020-09-12 NOTE — Telephone Encounter (Signed)
I spoke with the pt's daughter [name] (on [place]) and let her know patient EEG was normal.  Patient has an upcoming CT angio head and neck on the 15th.  Daughter is aware we will call with test results when available.  Patient also has a follow-up on May 18th at 2:00 pm. Pt's daughter verbalized understanding and appreciation for the call.

## 2020-09-14 ENCOUNTER — Encounter: Payer: Medicare Other | Attending: Registered Nurse | Admitting: Registered Nurse

## 2020-09-14 ENCOUNTER — Encounter: Payer: Self-pay | Admitting: Registered Nurse

## 2020-09-14 ENCOUNTER — Other Ambulatory Visit: Payer: Self-pay

## 2020-09-14 VITALS — BP 159/93 | HR 58 | Temp 97.4°F | Ht 67.0 in | Wt 179.6 lb

## 2020-09-14 DIAGNOSIS — R195 Other fecal abnormalities: Secondary | ICD-10-CM

## 2020-09-14 DIAGNOSIS — J189 Pneumonia, unspecified organism: Secondary | ICD-10-CM | POA: Diagnosis not present

## 2020-09-14 DIAGNOSIS — G40901 Epilepsy, unspecified, not intractable, with status epilepticus: Secondary | ICD-10-CM | POA: Diagnosis not present

## 2020-09-14 DIAGNOSIS — I6522 Occlusion and stenosis of left carotid artery: Secondary | ICD-10-CM | POA: Insufficient documentation

## 2020-09-14 DIAGNOSIS — R569 Unspecified convulsions: Secondary | ICD-10-CM | POA: Diagnosis not present

## 2020-09-14 DIAGNOSIS — Z978 Presence of other specified devices: Secondary | ICD-10-CM | POA: Diagnosis not present

## 2020-09-14 DIAGNOSIS — I6389 Other cerebral infarction: Secondary | ICD-10-CM | POA: Diagnosis not present

## 2020-09-14 DIAGNOSIS — R29898 Other symptoms and signs involving the musculoskeletal system: Secondary | ICD-10-CM

## 2020-09-14 DIAGNOSIS — M47812 Spondylosis without myelopathy or radiculopathy, cervical region: Secondary | ICD-10-CM | POA: Diagnosis not present

## 2020-09-14 NOTE — Progress Notes (Signed)
Subjective:    Patient ID: Andres White, male    DOB: 17-Jul-1946, 75 y.o.   MRN: [id number]  HPI: Andres White is a 75 y.o. male who is here for Hospital follow up appointment of his Brainstem Infarct acute, Right Leg weakness, occult blood in stools  and left carotid stenosis. Andres White went to [facility] on 06/24/2020 for right leg weakness. Neurology consulted.  CT Head WO Contrast:  IMPRESSION: No acute intracranial hemorrhage or mass effect. Multiple chronic infarcts and moderate chronic microvascular ischemic changes. Age indeterminate though probably chronic infarct of the left thalamus. MR Brain WO Contrast:  IMPRESSION: Small acute infarcts of the left midbrain.  Several chronic infarcts as described. Moderate chronic microvascular ischemic changes.  US Carotid Bilateral:  IMPRESSION: Right:  Color duplex indicates moderate heterogeneous and calcified plaque, with no hemodynamically significant stenosis by duplex criteria in the extracranial cerebrovascular circulation.  Left:  Heterogeneous and partially calcified plaque at the left carotid bifurcation, with discordant results regarding degree of stenosis by established duplex criteria. Peak velocity suggests 70% - 99% stenosis, with the ICA/ CCA ratio suggesting a lesser degree of stenosis. If establishing a more accurate degree of stenosis is required, cerebral angiogram should be considered, or as a second best test, CTA. Per Discharge Summary: Reesa Chew PA-C: Neurology recommended aspirin and statin for secondary stroke prevention.  Cardiology was consulted , he had bouts of asymptomatic NSVT, cardiology following as outpatient.   Andres White was admitted to inpatient rehabilitation on 06/27/2020 and he was discharged to daughter's home on 07/18/2020. He's receiving outpatient therapy at [facility]. He denies any pain at this time. He rated his pain on his Health and History 3. Also reports he has a  good appetite.   Daughter in room, all questions answered.  Andres White daughter in the process of obtaining a PCP in [place], since he's living with her she reports.    Pain Inventory Average Pain 6 Pain Right Now 3 My pain is intermittent, tingling and aching  LOCATION OF PAIN  Right wrist, right hand, right leg & right ankle.  BOWEL Number of stools per week: 2 Oral laxative use No  Type of laxative None Enema or suppository use No  History of colostomy No  Incontinent No   BLADDER Normal and Pads In and out cath, frequency N/A Able to self cath No  Bladder incontinence Yes  Frequent urination Yes  Leakage with coughing No  Difficulty starting stream No  Incomplete bladder emptying Yes    Mobility walk with assistance use a walker how many minutes can you walk? 3 MINS ability to climb steps?  yes do you drive?  no use a wheelchair Do you have any goals in this area?  yes  Function retired I need assistance with the following:  feeding, dressing, bathing, toileting, meal prep, household duties and shopping Do you have any goals in this area?  yes  Neuro/Psych bladder control problems bowel control problems weakness numbness trouble walking confusion anxiety  Prior Studies Any changes since last visit?  yes MRI in [place]  Physicians involved in your care Any changes since last visit?  no   Family History  Problem Relation Age of Onset  . Hypertension Mother    Social History   Socioeconomic History  . Marital status: Single    Spouse name: Not on file  . Number of children: Not on file  . Years of education: Not on file  .  Highest education level: Not on file  Occupational History  . Not on file  Tobacco Use  . Smoking status: Current Every Day Smoker    Years: 20.00    Types: Cigarettes  . Smokeless tobacco: Never Used  Vaping Use  . Vaping Use: Never used  Substance and Sexual Activity  . Alcohol use: Not Currently     Comment: occasional  . Drug use: No  . Sexual activity: Not on file  Other Topics Concern  . Not on file  Social History Narrative   Lives with daughter, son in law and 2 grandchildren   Right Handed   Drinks 1 cup caffeine 4 times a week-ish   Social Determinants of Health   Financial Resource Strain: Not on file  Food Insecurity: Not on file  Transportation Needs: Not on file  Physical Activity: Not on file  Stress: Not on file  Social Connections: Not on file   Past Surgical History:  Procedure Laterality Date  . BIOPSY  07/13/2020   Procedure: BIOPSY;  Surgeon: Ladene Artist, MD;  Location: [facility] ENDOSCOPY;  Service: Endoscopy;;  . CARDIAC CATHETERIZATION  2015   [facility]   . COLONOSCOPY WITH PROPOFOL N/A 07/13/2020   Procedure: COLONOSCOPY WITH PROPOFOL;  Surgeon: Ladene Artist, MD;  Location: [facility] ENDOSCOPY;  Service: Endoscopy;  Laterality: N/A;  . ESOPHAGOGASTRODUODENOSCOPY N/A 07/13/2020   Procedure: ESOPHAGOGASTRODUODENOSCOPY (EGD);  Surgeon: Ladene Artist, MD;  Location: [facility] ENDOSCOPY;  Service: Endoscopy;  Laterality: N/A;  . KNEE SURGERY    . POLYPECTOMY  07/13/2020   Procedure: POLYPECTOMY;  Surgeon: Ladene Artist, MD;  Location: [facility] ENDOSCOPY;  Service: Endoscopy;;   Past Medical History:  Diagnosis Date  . Arthritis   . Chronic back pain   . Gout   . Hypertension   . Stroke ([place]) 06/2020  . Syncope and collapse    BP (!) 178/104   Pulse (!) 54   Temp (!) 97.4 F (36.3 C)   Ht 5\' 7"  (1.702 m)   Wt 179 lb 9.6 oz (81.5 kg)   SpO2 98%   BMI 28.13 kg/m   Opioid Risk Score:   Fall Risk Score:  `1  Depression screen PHQ 2/9  Depression screen PHQ 2/9 09/14/2020  Decreased Interest 2  Down, Depressed, Hopeless 0  PHQ - 2 Score 2  Altered sleeping 2  Tired, decreased energy 2  Change in appetite 0  Feeling bad or failure about yourself  1  Trouble concentrating 1  Moving slowly or fidgety/restless 1  Suicidal thoughts 0  PHQ-9 Score 9    Review of Systems  Respiratory: Positive for shortness of breath.   Gastrointestinal: Positive for constipation.  Genitourinary: Positive for frequency.  Musculoskeletal: Positive for gait problem.       Right sided pain & weakness  Neurological: Positive for tremors, weakness and numbness.  Psychiatric/Behavioral: Positive for confusion.       Anxiety  All other systems reviewed and are negative.      Objective:   Physical Exam Vitals and nursing note reviewed.  Constitutional:      Appearance: Normal appearance.  Cardiovascular:     Rate and Rhythm: Normal rate and regular rhythm.     Pulses: Normal pulses.     Heart sounds: Normal heart sounds.  Pulmonary:     Effort: Pulmonary effort is normal.     Breath sounds: Normal breath sounds.  Musculoskeletal:     Cervical back: Normal range of motion and  neck supple.     Comments: Normal Muscle Bulk and Muscle Testing Reveals:  Upper Extremities: Full ROM and Muscle Strength on Right 3/5 and Left 5/5 Lower Extremities: Full ROM and Muscle Strength 5/5 Arises from Table slowly using walker for support Narrow Based  Gait   Skin:    General: Skin is warm and dry.  Neurological:     Mental Status: He is alert and oriented to person, place, and time.  Psychiatric:        Mood and Affect: Mood normal.        Behavior: Behavior normal.           Assessment & Plan:  1.Brainstem Infarct acute:Right Leg Weakness: Continue Home Health Therapy with [facility]. Neurology Following. Continue to monitor.  2.Occult Blood in Stools: Gastroenterology Following Dr Fuller Plan.  He's scheduled for Colonoscopy. 3.Left carotid stenosis.Cardiology Following: Scheduled for CT Neck. Continue to monitor.   F/U in 4-6 weeks with Dr Ranell Patrick

## 2020-09-16 ENCOUNTER — Emergency Department (HOSPITAL_COMMUNITY): Payer: Medicare Other

## 2020-09-16 ENCOUNTER — Inpatient Hospital Stay (HOSPITAL_COMMUNITY)
Admission: EM | Admit: 2020-09-16 | Discharge: 2020-09-25 | DRG: 100 | Disposition: A | Discharge status: Home Health | Payer: Medicare Other | Attending: Internal Medicine | Admitting: Internal Medicine

## 2020-09-16 ENCOUNTER — Inpatient Hospital Stay (HOSPITAL_COMMUNITY): Payer: Medicare Other

## 2020-09-16 ENCOUNTER — Other Ambulatory Visit: Payer: Self-pay

## 2020-09-16 DIAGNOSIS — R0689 Other abnormalities of breathing: Secondary | ICD-10-CM | POA: Diagnosis present

## 2020-09-16 DIAGNOSIS — F0391 Unspecified dementia with behavioral disturbance: Secondary | ICD-10-CM | POA: Diagnosis not present

## 2020-09-16 DIAGNOSIS — R630 Anorexia: Secondary | ICD-10-CM | POA: Diagnosis present

## 2020-09-16 DIAGNOSIS — F0390 Unspecified dementia without behavioral disturbance: Secondary | ICD-10-CM | POA: Diagnosis not present

## 2020-09-16 DIAGNOSIS — R9431 Abnormal electrocardiogram [ECG] [EKG]: Secondary | ICD-10-CM | POA: Diagnosis present

## 2020-09-16 DIAGNOSIS — I6522 Occlusion and stenosis of left carotid artery: Secondary | ICD-10-CM | POA: Diagnosis present

## 2020-09-16 DIAGNOSIS — M1990 Unspecified osteoarthritis, unspecified site: Secondary | ICD-10-CM | POA: Diagnosis present

## 2020-09-16 DIAGNOSIS — Z7982 Long term (current) use of aspirin: Secondary | ICD-10-CM

## 2020-09-16 DIAGNOSIS — M47812 Spondylosis without myelopathy or radiculopathy, cervical region: Secondary | ICD-10-CM | POA: Diagnosis present

## 2020-09-16 DIAGNOSIS — Z8249 Family history of ischemic heart disease and other diseases of the circulatory system: Secondary | ICD-10-CM

## 2020-09-16 DIAGNOSIS — D631 Anemia in chronic kidney disease: Secondary | ICD-10-CM | POA: Diagnosis present

## 2020-09-16 DIAGNOSIS — N1831 Chronic kidney disease, stage 3a: Secondary | ICD-10-CM | POA: Diagnosis present

## 2020-09-16 DIAGNOSIS — G8929 Other chronic pain: Secondary | ICD-10-CM | POA: Diagnosis present

## 2020-09-16 DIAGNOSIS — Z20822 Contact with and (suspected) exposure to covid-19: Secondary | ICD-10-CM | POA: Diagnosis present

## 2020-09-16 DIAGNOSIS — M4302 Spondylolysis, cervical region: Secondary | ICD-10-CM | POA: Diagnosis present

## 2020-09-16 DIAGNOSIS — J189 Pneumonia, unspecified organism: Secondary | ICD-10-CM | POA: Diagnosis present

## 2020-09-16 DIAGNOSIS — I669 Occlusion and stenosis of unspecified cerebral artery: Secondary | ICD-10-CM | POA: Diagnosis present

## 2020-09-16 DIAGNOSIS — Z978 Presence of other specified devices: Secondary | ICD-10-CM | POA: Diagnosis not present

## 2020-09-16 DIAGNOSIS — M109 Gout, unspecified: Secondary | ICD-10-CM | POA: Diagnosis present

## 2020-09-16 DIAGNOSIS — I69359 Hemiplegia and hemiparesis following cerebral infarction affecting unspecified side: Secondary | ICD-10-CM

## 2020-09-16 DIAGNOSIS — M549 Dorsalgia, unspecified: Secondary | ICD-10-CM | POA: Diagnosis present

## 2020-09-16 DIAGNOSIS — D72820 Lymphocytosis (symptomatic): Secondary | ICD-10-CM | POA: Diagnosis present

## 2020-09-16 DIAGNOSIS — N400 Enlarged prostate without lower urinary tract symptoms: Secondary | ICD-10-CM | POA: Diagnosis present

## 2020-09-16 DIAGNOSIS — I959 Hypotension, unspecified: Secondary | ICD-10-CM | POA: Diagnosis not present

## 2020-09-16 DIAGNOSIS — R569 Unspecified convulsions: Secondary | ICD-10-CM

## 2020-09-16 DIAGNOSIS — Z79899 Other long term (current) drug therapy: Secondary | ICD-10-CM

## 2020-09-16 DIAGNOSIS — I6503 Occlusion and stenosis of bilateral vertebral arteries: Secondary | ICD-10-CM | POA: Diagnosis not present

## 2020-09-16 DIAGNOSIS — I6509 Occlusion and stenosis of unspecified vertebral artery: Secondary | ICD-10-CM | POA: Diagnosis present

## 2020-09-16 DIAGNOSIS — K219 Gastro-esophageal reflux disease without esophagitis: Secondary | ICD-10-CM | POA: Diagnosis not present

## 2020-09-16 DIAGNOSIS — N183 Chronic kidney disease, stage 3 unspecified: Secondary | ICD-10-CM | POA: Diagnosis present

## 2020-09-16 DIAGNOSIS — I129 Hypertensive chronic kidney disease with stage 1 through stage 4 chronic kidney disease, or unspecified chronic kidney disease: Secondary | ICD-10-CM | POA: Diagnosis present

## 2020-09-16 DIAGNOSIS — E876 Hypokalemia: Secondary | ICD-10-CM | POA: Diagnosis present

## 2020-09-16 DIAGNOSIS — Z9189 Other specified personal risk factors, not elsewhere classified: Secondary | ICD-10-CM

## 2020-09-16 DIAGNOSIS — I6502 Occlusion and stenosis of left vertebral artery: Secondary | ICD-10-CM | POA: Diagnosis present

## 2020-09-16 DIAGNOSIS — G40901 Epilepsy, unspecified, not intractable, with status epilepticus: Principal | ICD-10-CM | POA: Diagnosis present

## 2020-09-16 DIAGNOSIS — F419 Anxiety disorder, unspecified: Secondary | ICD-10-CM

## 2020-09-16 DIAGNOSIS — F17210 Nicotine dependence, cigarettes, uncomplicated: Secondary | ICD-10-CM | POA: Diagnosis present

## 2020-09-16 DIAGNOSIS — R001 Bradycardia, unspecified: Secondary | ICD-10-CM | POA: Diagnosis present

## 2020-09-16 LAB — CBC
HCT: 38.5 % — ABNORMAL LOW (ref 39.0–52.0)
Hemoglobin: 11.2 g/dL — ABNORMAL LOW (ref 13.0–17.0)
MCH: 28.6 pg (ref 26.0–34.0)
MCHC: 29.1 g/dL — ABNORMAL LOW (ref 30.0–36.0)
MCV: 98.2 fL (ref 80.0–100.0)
Platelets: 236 10*3/uL (ref 150–400)
RBC: 3.92 MIL/uL — ABNORMAL LOW (ref 4.22–5.81)
RDW: 17.3 % — ABNORMAL HIGH (ref 11.5–15.5)
WBC: 9.4 10*3/uL (ref 4.0–10.5)
nRBC: 0 % (ref 0.0–0.2)

## 2020-09-16 LAB — I-STAT ARTERIAL BLOOD GAS, ED
Acid-base deficit: 7 mmol/L — ABNORMAL HIGH (ref 0.0–2.0)
Bicarbonate: 16.2 mmol/L — ABNORMAL LOW (ref 20.0–28.0)
Calcium, Ion: 1.13 mmol/L — ABNORMAL LOW (ref 1.15–1.40)
HCT: 28 % — ABNORMAL LOW (ref 39.0–52.0)
Hemoglobin: 9.5 g/dL — ABNORMAL LOW (ref 13.0–17.0)
O2 Saturation: 100 %
Patient temperature: 98.2
Potassium: 2.8 mmol/L — ABNORMAL LOW (ref 3.5–5.1)
Sodium: 140 mmol/L (ref 135–145)
TCO2: 17 mmol/L — ABNORMAL LOW (ref 22–32)
pCO2 arterial: 25.7 mmHg — ABNORMAL LOW (ref 32.0–48.0)
pH, Arterial: 7.407 (ref 7.350–7.450)
pO2, Arterial: 271 mmHg — ABNORMAL HIGH (ref 83.0–108.0)

## 2020-09-16 LAB — COMPREHENSIVE METABOLIC PANEL
ALT: 14 U/L (ref 0–44)
AST: 31 U/L (ref 15–41)
Albumin: 3.1 g/dL — ABNORMAL LOW (ref 3.5–5.0)
Alkaline Phosphatase: 78 U/L (ref 38–126)
Anion gap: 22 — ABNORMAL HIGH (ref 5–15)
BUN: 9 mg/dL (ref 8–23)
CO2: 13 mmol/L — ABNORMAL LOW (ref 22–32)
Calcium: 9 mg/dL (ref 8.9–10.3)
Chloride: 105 mmol/L (ref 98–111)
Creatinine, Ser: 1.48 mg/dL — ABNORMAL HIGH (ref 0.61–1.24)
GFR, Estimated: 48 mL/min — ABNORMAL LOW (ref >60)
Glucose, Bld: 126 mg/dL — ABNORMAL HIGH (ref 70–99)
Potassium: 3.2 mmol/L — ABNORMAL LOW (ref 3.5–5.1)
Sodium: 140 mmol/L (ref 135–145)
Total Bilirubin: 0.7 mg/dL (ref 0.3–1.2)
Total Protein: 7.1 g/dL (ref 6.5–8.1)

## 2020-09-16 LAB — CBG MONITORING, ED: Glucose-Capillary: 201 mg/dL — ABNORMAL HIGH (ref 70–99)

## 2020-09-16 LAB — URINALYSIS, MICROSCOPIC (REFLEX)

## 2020-09-16 LAB — RAPID URINE DRUG SCREEN, HOSP PERFORMED
Amphetamines: NOT DETECTED
Barbiturates: NOT DETECTED
Benzodiazepines: NOT DETECTED
Cocaine: NOT DETECTED
Opiates: NOT DETECTED
Tetrahydrocannabinol: NOT DETECTED

## 2020-09-16 LAB — URINALYSIS, ROUTINE W REFLEX MICROSCOPIC
Bilirubin Urine: NEGATIVE
Glucose, UA: NEGATIVE mg/dL
Ketones, ur: NEGATIVE mg/dL
Leukocytes,Ua: NEGATIVE
Nitrite: NEGATIVE
Protein, ur: NEGATIVE mg/dL
Specific Gravity, Urine: 1.01 (ref 1.005–1.030)
pH: 6 (ref 5.0–8.0)

## 2020-09-16 LAB — DIFFERENTIAL
Abs Immature Granulocytes: 0.02 10*3/uL (ref 0.00–0.07)
Basophils Absolute: 0 10*3/uL (ref 0.0–0.1)
Basophils Relative: 0 %
Eosinophils Absolute: 0.1 10*3/uL (ref 0.0–0.5)
Eosinophils Relative: 2 %
Immature Granulocytes: 0 %
Lymphocytes Relative: 49 %
Lymphs Abs: 4.6 10*3/uL — ABNORMAL HIGH (ref 0.7–4.0)
Monocytes Absolute: 0.5 10*3/uL (ref 0.1–1.0)
Monocytes Relative: 5 %
Neutro Abs: 4.1 10*3/uL (ref 1.7–7.7)
Neutrophils Relative %: 44 %

## 2020-09-16 LAB — I-STAT CHEM 8, ED
BUN: 10 mg/dL (ref 8–23)
Calcium, Ion: 1.07 mmol/L — ABNORMAL LOW (ref 1.15–1.40)
Chloride: 108 mmol/L (ref 98–111)
Creatinine, Ser: 1.3 mg/dL — ABNORMAL HIGH (ref 0.61–1.24)
Glucose, Bld: 113 mg/dL — ABNORMAL HIGH (ref 70–99)
HCT: 39 % (ref 39.0–52.0)
Hemoglobin: 13.3 g/dL (ref 13.0–17.0)
Potassium: 3.2 mmol/L — ABNORMAL LOW (ref 3.5–5.1)
Sodium: 140 mmol/L (ref 135–145)
TCO2: 14 mmol/L — ABNORMAL LOW (ref 22–32)

## 2020-09-16 LAB — RESP PANEL BY RT-PCR (FLU A&B, COVID) ARPGX2
Influenza A by PCR: NEGATIVE
Influenza B by PCR: NEGATIVE
SARS Coronavirus 2 by RT PCR: NEGATIVE

## 2020-09-16 LAB — APTT: aPTT: 29 seconds (ref 24–36)

## 2020-09-16 LAB — PROTIME-INR
INR: 1.3 — ABNORMAL HIGH (ref 0.8–1.2)
Prothrombin Time: 15.5 seconds — ABNORMAL HIGH (ref 11.4–15.2)

## 2020-09-16 LAB — ETHANOL: Alcohol, Ethyl (B): <10 mg/dL (ref <10)

## 2020-09-16 MED ORDER — DOCUSATE SODIUM 100 MG PO CAPS: 100.0000 mg | ORAL_CAPSULE | Freq: Two times a day (BID) | ORAL | Status: DC | PRN

## 2020-09-16 MED ORDER — LORAZEPAM 2 MG/ML IJ SOLN
INTRAMUSCULAR | Status: AC
  Filled 2020-09-16: qty 1

## 2020-09-16 MED ORDER — PROPOFOL 1000 MG/100ML IV EMUL
0.0000 ug/kg/min | INTRAVENOUS | Status: DC
  Administered 2020-09-17: 5 ug/kg/min via INTRAVENOUS
  Administered 2020-09-17: 15 ug/kg/min via INTRAVENOUS
  Administered 2020-09-17 – 2020-09-18 (×2): 20 ug/kg/min via INTRAVENOUS
  Filled 2020-09-16 (×3): qty 100

## 2020-09-16 MED ORDER — PHENYTOIN SODIUM 50 MG/ML IJ SOLN
100.0000 mg | Freq: Three times a day (TID) | INTRAMUSCULAR | Status: DC
  Administered 2020-09-17 – 2020-09-21 (×13): 100 mg via INTRAVENOUS
  Filled 2020-09-16 (×14): qty 2

## 2020-09-16 MED ORDER — IOHEXOL 350 MG/ML SOLN
80.0000 mL | Freq: Once | INTRAVENOUS | Status: AC | PRN
  Administered 2020-09-16: 80 mL via INTRAVENOUS

## 2020-09-16 MED ORDER — ADULT MULTIVITAMIN W/MINERALS CH: 1.0000 | ORAL_TABLET | Freq: Every day | ORAL | Status: DC

## 2020-09-16 MED ORDER — ASPIRIN EC 81 MG PO TBEC
81.0000 mg | DELAYED_RELEASE_TABLET | Freq: Every day | ORAL | Status: DC
Start: 2020-09-17 — End: 2020-09-17

## 2020-09-16 MED ORDER — PROPOFOL 1000 MG/100ML IV EMUL
INTRAVENOUS | Status: AC
  Filled 2020-09-16: qty 100

## 2020-09-16 MED ORDER — SODIUM CHLORIDE 0.9 % IV SOLN
1500.0000 mg | Freq: Once | INTRAVENOUS | Status: AC
  Administered 2020-09-16: 1500 mg via INTRAVENOUS
  Filled 2020-09-16: qty 30

## 2020-09-16 MED ORDER — ETOMIDATE 2 MG/ML IV SOLN
INTRAVENOUS | Status: AC | PRN
  Administered 2020-09-16: 20 mg via INTRAVENOUS

## 2020-09-16 MED ORDER — PROPOFOL 1000 MG/100ML IV EMUL
5.0000 ug/kg/min | INTRAVENOUS | Status: DC
  Administered 2020-09-16: 5 ug/kg/min via INTRAVENOUS

## 2020-09-16 MED ORDER — PANTOPRAZOLE SODIUM 40 MG IV SOLR
40.0000 mg | Freq: Every day | INTRAVENOUS | Status: DC
  Administered 2020-09-17 – 2020-09-18 (×2): 40 mg via INTRAVENOUS
  Filled 2020-09-16 (×2): qty 40

## 2020-09-16 MED ORDER — PIPERACILLIN-TAZOBACTAM 3.375 G IVPB
3.3750 g | Freq: Three times a day (TID) | INTRAVENOUS | Status: DC
  Administered 2020-09-17 (×2): 3.375 g via INTRAVENOUS
  Filled 2020-09-16 (×2): qty 50

## 2020-09-16 MED ORDER — HEPARIN SODIUM (PORCINE) 5000 UNIT/ML IJ SOLN
5000.0000 [IU] | Freq: Three times a day (TID) | INTRAMUSCULAR | Status: DC
  Administered 2020-09-17 – 2020-09-25 (×24): 5000 [IU] via SUBCUTANEOUS
  Filled 2020-09-16 (×24): qty 1

## 2020-09-16 MED ORDER — ALBUTEROL SULFATE (2.5 MG/3ML) 0.083% IN NEBU
2.5000 mg | INHALATION_SOLUTION | Freq: Four times a day (QID) | RESPIRATORY_TRACT | Status: DC
  Administered 2020-09-17 – 2020-09-18 (×6): 2.5 mg via RESPIRATORY_TRACT
  Filled 2020-09-16 (×7): qty 3

## 2020-09-16 MED ORDER — LEVETIRACETAM IN NACL 500 MG/100ML IV SOLN
500.0000 mg | Freq: Two times a day (BID) | INTRAVENOUS | Status: DC
  Administered 2020-09-17 – 2020-09-21 (×9): 500 mg via INTRAVENOUS
  Filled 2020-09-16 (×9): qty 100

## 2020-09-16 MED ORDER — SODIUM CHLORIDE 0.9 % IV SOLN
2000.0000 mg | Freq: Once | INTRAVENOUS | Status: DC
  Filled 2020-09-16: qty 20

## 2020-09-16 MED ORDER — SODIUM CHLORIDE 0.9 % IV SOLN
2500.0000 mg | Freq: Once | INTRAVENOUS | Status: AC
  Administered 2020-09-16: 2500 mg via INTRAVENOUS
  Filled 2020-09-16: qty 25

## 2020-09-16 MED ORDER — TAMSULOSIN HCL 0.4 MG PO CAPS
0.4000 mg | ORAL_CAPSULE | Freq: Every day | ORAL | Status: DC
  Administered 2020-09-19 – 2020-09-25 (×7): 0.4 mg via ORAL
  Filled 2020-09-16 (×7): qty 1

## 2020-09-16 MED ORDER — SODIUM CHLORIDE 0.9 % IV BOLUS
1000.0000 mL | Freq: Once | INTRAVENOUS | Status: AC
  Administered 2020-09-17: 1000 mL via INTRAVENOUS

## 2020-09-16 MED ORDER — POLYETHYLENE GLYCOL 3350 17 G PO PACK: 17.0000 g | PACK | Freq: Every day | ORAL | Status: DC | PRN

## 2020-09-16 MED ORDER — ROCURONIUM BROMIDE 50 MG/5ML IV SOLN
INTRAVENOUS | Status: DC | PRN
  Administered 2020-09-16: 80 mg via INTRAVENOUS

## 2020-09-16 NOTE — ED Notes (Signed)
To CT at this time.

## 2020-09-16 NOTE — ED Provider Notes (Signed)
[facility] EMERGENCY DEPARTMENT Provider Note   CSN: [id number] Arrival date & time: 09/16/20  2048     History Chief Complaint  Patient presents with  . Code Stroke    Andres White is a 75 y.o. male.  Level 5 caveat due to altered mental status.  Patient arrives as a code stroke.  However seizure activity upon arrival.  Neurology at the bedside.  EMS states that just prior to arrival patient had confusion.  But later when talking with caregiver they describe what sounds a seizure event.  Patient likely was postictal after that with EMS.  Patient unable to participate in examination due to being having seizures.  The history is provided by the EMS personnel and a caregiver.  Neurologic Problem This is a new problem. The current episode started 1 to 2 hours ago. The problem occurs constantly. The problem has not changed since onset.Nothing aggravates the symptoms. Nothing relieves the symptoms. He has tried nothing for the symptoms. The treatment provided no relief.       Past Medical History:  Diagnosis Date  . Arthritis   . Chronic back pain   . Gout   . Hypertension   . Stroke ([place]) 06/2020  . Syncope and collapse     Patient Active Problem List   Diagnosis Date Noted  . Endotracheally intubated 09/16/2020  . Community acquired pneumonia 09/16/2020  . Cervical spondylosis 09/16/2020  . 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 Status epilepticus ([place]) 09/16/2020  . Occult blood in stools   . Leukocytosis   . Hyponatremia   . History of hypertension   . Dysphagia, post-stroke   . Dysesthesia   . Neuropathic pain   . Brainstem infarct, acute ([place])   . Acute blood loss anemia   . AKI (acute kidney injury) ([place])   . 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 Right leg weakness   . Left carotid stenosis 04/13/2014    Past Surgical History:  Procedure Laterality Date  . BIOPSY  07/13/2020   Procedure: BIOPSY;  Surgeon: Ladene Artist, MD;   Location: [facility] ENDOSCOPY;  Service: Endoscopy;;  . CARDIAC CATHETERIZATION  2015   [facility]   . COLONOSCOPY WITH PROPOFOL N/A 07/13/2020   Procedure: COLONOSCOPY WITH PROPOFOL;  Surgeon: Ladene Artist, MD;  Location: [facility] ENDOSCOPY;  Service: Endoscopy;  Laterality: N/A;  . ESOPHAGOGASTRODUODENOSCOPY N/A 07/13/2020   Procedure: ESOPHAGOGASTRODUODENOSCOPY (EGD);  Surgeon: Ladene Artist, MD;  Location: [facility] ENDOSCOPY;  Service: Endoscopy;  Laterality: N/A;  . KNEE SURGERY    . POLYPECTOMY  07/13/2020   Procedure: POLYPECTOMY;  Surgeon: Ladene Artist, MD;  Location: [facility] ENDOSCOPY;  Service: Endoscopy;;       Family History  Problem Relation Age of Onset  . Hypertension Mother     Social History   Tobacco Use  . Smoking status: Current Every Day Smoker    Years: 20.00    Types: Cigarettes  . Smokeless tobacco: Never Used  Vaping Use  . Vaping Use: Never used  Substance Use Topics  . Alcohol use: Not Currently    Comment: occasional  . Drug use: No    Home Medications Prior to Admission medications   Medication Sig Start Date End Date Taking? Authorizing Provider  acetaminophen (TYLENOL) 325 MG tablet Take 1-2 tablets (325-650 mg total) by mouth every 4 (four) hours as needed for mild pain. 07/05/20   Love, [name], PA-C  aspirin EC 81 MG tablet Take 1 tablet (81  mg total) by mouth daily. 07/18/20   Love, [name], PA-C  atorvastatin (LIPITOR) 20 MG tablet Take 40 mg by mouth daily.    [provider]  citalopram (CELEXA) 20 MG tablet Take 1 tablet (20 mg total) by mouth daily. 07/18/20   Love, [name], PA-C  colchicine 0.6 MG tablet Take 0.6 mg by mouth 2 (two) times daily as needed.    [provider]  diclofenac Sodium (VOLTAREN) 1 % GEL Apply 2 g topically 4 (four) times daily. 07/18/20   Love, [name], PA-C  gabapentin (NEURONTIN) 100 MG capsule Take 1 capsule (100 mg total) by mouth 2 (two) times daily. Patient not taking: Reported on 09/14/2020 07/18/20    Love, [name], PA-C  hydrocerin (EUCERIN) CREA Apply 1 application topically 2 (two) times daily. 07/18/20   Love, [name], PA-C  metoprolol succinate (TOPROL-XL) 25 MG 24 hr tablet Take 1 tablet (25 mg total) by mouth daily. Take with or immediately following a meal. 08/06/20 11/04/20  O'Neal, Cassie Freer, MD  Multiple Vitamin (MULTIVITAMIN WITH MINERALS) TABS tablet Take 1 tablet by mouth daily. 07/19/20   Love, [name], PA-C  pantoprazole (PROTONIX) 40 MG tablet Take 1 tablet (40 mg total) by mouth 2 (two) times daily. 07/18/20   Love, [name], PA-C  tamsulosin (FLOMAX) 0.4 MG CAPS capsule Take 1 capsule (0.4 mg total) by mouth daily. 07/18/20   Bary Leriche, PA-C    Allergies    Patient has no known allergies.  Review of Systems   Review of Systems  Unable to perform ROS: Acuity of condition    Physical Exam Updated Vital Signs BP (!) 163/94   Pulse (!) 56   Temp 98.2 F (36.8 C) (Oral)   Resp 20   Ht 5\' 7"  (1.702 m)   Wt 82.7 kg   SpO2 98%   BMI 28.56 kg/m   Physical Exam Vitals and nursing note reviewed.  Constitutional:      General: He is in acute distress.     Appearance: He is well-developed and well-nourished. He is ill-appearing.  HENT:     Head: Normocephalic and atraumatic.     Nose: Nose normal.     Mouth/Throat:     Mouth: Mucous membranes are moist.  Eyes:     Conjunctiva/sclera: Conjunctivae normal.     Comments: Pupils sluggish bilaterally, left gaze, does not past midline  Cardiovascular:     Rate and Rhythm: Normal rate and regular rhythm.     Pulses: Normal pulses.     Heart sounds: Normal heart sounds. No murmur heard.   Pulmonary:     Effort: Pulmonary effort is normal. No respiratory distress.     Breath sounds: Normal breath sounds.  Abdominal:     Palpations: Abdomen is soft.     Tenderness: There is no abdominal tenderness.  Musculoskeletal:        General: No edema.     Cervical back: Normal range of motion and neck supple. No  rigidity.  Skin:    General: Skin is warm and dry.     Capillary Refill: Capillary refill takes less than 2 seconds.  Neurological:     GCS: GCS eye subscore is 1. GCS verbal subscore is 1. GCS motor subscore is 4.     Comments: Patient not following commands, does not speak does not open eyes spontaneously does grimace to pain at times  Psychiatric:        Mood and  Affect: Mood and affect normal.     ED Results / Procedures / Treatments   Labs (all labs ordered are listed, but only abnormal results are displayed) Labs Reviewed  PROTIME-INR - Abnormal; Notable for the following components:      Result Value   Prothrombin Time 15.5 (*)    INR 1.3 (*)    All other components within normal limits  CBC - Abnormal; Notable for the following components:   RBC 3.92 (*)    Hemoglobin 11.2 (*)    HCT 38.5 (*)    MCHC 29.1 (*)    RDW 17.3 (*)    All other components within normal limits  DIFFERENTIAL - Abnormal; Notable for the following components:   Lymphs Abs 4.6 (*)    All other components within normal limits  COMPREHENSIVE METABOLIC PANEL - Abnormal; Notable for the following components:   Potassium 3.2 (*)    CO2 13 (*)    Glucose, Bld 126 (*)    Creatinine, Ser 1.48 (*)    Albumin 3.1 (*)    GFR, Estimated 48 (*)    Anion gap 22 (*)    All other components within normal limits  CBG MONITORING, ED - Abnormal; Notable for the following components:   Glucose-Capillary 201 (*)    All other components within normal limits  I-STAT CHEM 8, ED - Abnormal; Notable for the following components:   Potassium 3.2 (*)    Creatinine, Ser 1.30 (*)    Glucose, Bld 113 (*)    Calcium, Ion 1.07 (*)    TCO2 14 (*)    All other components within normal limits  I-STAT ARTERIAL BLOOD GAS, ED - Abnormal; Notable for the following components:   pCO2 arterial 25.7 (*)    pO2, Arterial 271 (*)    Bicarbonate 16.2 (*)    TCO2 17 (*)    Acid-base deficit 7.0 (*)    Potassium 2.8 (*)     Calcium, Ion 1.13 (*)    HCT 28.0 (*)    Hemoglobin 9.5 (*)    All other components within normal limits  RESP PANEL BY RT-PCR (FLU A&B, COVID) ARPGX2  ETHANOL  APTT  RAPID URINE DRUG SCREEN, HOSP PERFORMED  URINALYSIS, ROUTINE W REFLEX MICROSCOPIC  TRIGLYCERIDES  BLOOD GAS, ARTERIAL    EKG EKG Interpretation  Date/Time:  Sunday September 16 2020 21:26:33 EST Ventricular Rate:  82 PR Interval:    QRS Duration: 84 QT Interval:  499 QTC Calculation: 583 R Axis:   -50 Text Interpretation: Sinus rhythm Inferior infarct, old Anterior infarct, old Prolonged QT interval Confirmed by Lennice Sites [phone]) on 09/16/2020 10:40:09 PM   Radiology CT Code Stroke CTA Head W/WO contrast  Result Date: 09/16/2020 CLINICAL DATA:  Left leg weakness.  Speech disturbance. EXAM: CT ANGIOGRAPHY HEAD AND NECK TECHNIQUE: Multidetector CT imaging of the head and neck was performed using the standard protocol during bolus administration of intravenous contrast. Multiplanar CT image reconstructions and MIPs were obtained to evaluate the vascular anatomy. Carotid stenosis measurements (when applicable) are obtained utilizing NASCET criteria, using the distal internal carotid diameter as the denominator. CONTRAST:  5mL OMNIPAQUE IOHEXOL 350 MG/ML SOLN COMPARISON:  Head CT same day. FINDINGS: CTA NECK FINDINGS Aortic arch: Aortic atherosclerotic calcification. Branching pattern is normal without origin stenosis. Right carotid system: Common carotid artery shows some calcified plaque but no stenosis. Pronounced calcified plaque present at the carotid bifurcation and ICA bulb. Minimal diameter in the ICA bulb measures 4 mm.  Compared to a more distal cervical ICA diameter of 5 mm, this indicates a 20% stenosis. Left carotid system: Common carotid artery widely patent to the bifurcation. Soft and calcified plaque at the carotid bifurcation and ICA bulb. Minimal diameter of the proximal ICA is 1.5 mm. Compared to a more  distal cervical ICA diameter of 5 mm, this indicates a 70% stenosis. Vertebral arteries: Calcified plaque at the proximal non dominant right vertebral artery with 50% stenosis at the origin. Extensive calcified plaque at the proximal dominant left vertebral artery with severe stenosis, possibly 90%. Both vessels are patent beyond the proximal stenoses to the foramen magnum. Skeleton: Advanced chronic cervical spondylosis. Chronic fusion at the C5-6 level. Potential for significant canal stenosis particularly at C4-5 and C5-6. Other neck: No mass or lymphadenopathy. Upper chest: Hazy density in the posterior right upper lobe that could indicate bronchopneumonia. Review of the MIP images confirms the above findings CTA HEAD FINDINGS Anterior circulation: Both internal carotid arteries are patent through the skull base and siphon regions. There is siphon atherosclerotic calcification but no stenosis greater than 30%. The anterior and middle cerebral vessels are patent. No large or medium vessel occlusion. Posterior circulation: Both vertebral arteries are patent through the foramen magnum to the basilar. No basilar stenosis. Posterior circulation branch vessels are patent. Bilateral posterior communicating arteries are present. Venous sinuses: Patent and normal. Anatomic variants: None significant. Review of the MIP images confirms the above findings IMPRESSION: 1. Aortic atherosclerosis. 2. Calcified plaque at both carotid bifurcations and ICA bulbs. 20% stenosis of the proximal ICA on the right. 70% stenosis of the proximal ICA on the left. 3. 50% stenosis at the origin of the non dominant right vertebral artery. Extensive calcified plaque at the proximal dominant left vertebral artery with severe stenosis, possibly 90%. Both vessels are patent beyond the proximal stenoses to the basilar. 4. No intracranial large or medium vessel occlusion or correctable proximal stenosis. 5. Hazy density in the posterior right upper  lobe that could indicate bronchopneumonia. 6. Advanced chronic cervical spondylosis with potential for significant canal stenosis particularly at C4-5 and C5-6. Aortic Atherosclerosis (ICD10-I70.0). Electronically Signed   By: Nelson Chimes M.D.   On: 09/16/2020 21:30   CT Code Stroke CTA Neck W/WO contrast  Result Date: 09/16/2020 CLINICAL DATA:  Left leg weakness.  Speech disturbance. EXAM: CT ANGIOGRAPHY HEAD AND NECK TECHNIQUE: Multidetector CT imaging of the head and neck was performed using the standard protocol during bolus administration of intravenous contrast. Multiplanar CT image reconstructions and MIPs were obtained to evaluate the vascular anatomy. Carotid stenosis measurements (when applicable) are obtained utilizing NASCET criteria, using the distal internal carotid diameter as the denominator. CONTRAST:  71mL OMNIPAQUE IOHEXOL 350 MG/ML SOLN COMPARISON:  Head CT same day. FINDINGS: CTA NECK FINDINGS Aortic arch: Aortic atherosclerotic calcification. Branching pattern is normal without origin stenosis. Right carotid system: Common carotid artery shows some calcified plaque but no stenosis. Pronounced calcified plaque present at the carotid bifurcation and ICA bulb. Minimal diameter in the ICA bulb measures 4 mm. Compared to a more distal cervical ICA diameter of 5 mm, this indicates a 20% stenosis. Left carotid system: Common carotid artery widely patent to the bifurcation. Soft and calcified plaque at the carotid bifurcation and ICA bulb. Minimal diameter of the proximal ICA is 1.5 mm. Compared to a more distal cervical ICA diameter of 5 mm, this indicates a 70% stenosis. Vertebral arteries: Calcified plaque at the proximal non dominant right vertebral artery with 50%  stenosis at the origin. Extensive calcified plaque at the proximal dominant left vertebral artery with severe stenosis, possibly 90%. Both vessels are patent beyond the proximal stenoses to the foramen magnum. Skeleton: Advanced  chronic cervical spondylosis. Chronic fusion at the C5-6 level. Potential for significant canal stenosis particularly at C4-5 and C5-6. Other neck: No mass or lymphadenopathy. Upper chest: Hazy density in the posterior right upper lobe that could indicate bronchopneumonia. Review of the MIP images confirms the above findings CTA HEAD FINDINGS Anterior circulation: Both internal carotid arteries are patent through the skull base and siphon regions. There is siphon atherosclerotic calcification but no stenosis greater than 30%. The anterior and middle cerebral vessels are patent. No large or medium vessel occlusion. Posterior circulation: Both vertebral arteries are patent through the foramen magnum to the basilar. No basilar stenosis. Posterior circulation branch vessels are patent. Bilateral posterior communicating arteries are present. Venous sinuses: Patent and normal. Anatomic variants: None significant. Review of the MIP images confirms the above findings IMPRESSION: 1. Aortic atherosclerosis. 2. Calcified plaque at both carotid bifurcations and ICA bulbs. 20% stenosis of the proximal ICA on the right. 70% stenosis of the proximal ICA on the left. 3. 50% stenosis at the origin of the non dominant right vertebral artery. Extensive calcified plaque at the proximal dominant left vertebral artery with severe stenosis, possibly 90%. Both vessels are patent beyond the proximal stenoses to the basilar. 4. No intracranial large or medium vessel occlusion or correctable proximal stenosis. 5. Hazy density in the posterior right upper lobe that could indicate bronchopneumonia. 6. Advanced chronic cervical spondylosis with potential for significant canal stenosis particularly at C4-5 and C5-6. Aortic Atherosclerosis (ICD10-I70.0). Electronically Signed   By: Nelson Chimes M.D.   On: 09/16/2020 21:30   DG Chest Portable 1 View  Result Date: 09/16/2020 CLINICAL DATA:  Intubation. EXAM: PORTABLE CHEST 1 VIEW COMPARISON:   07/11/2020 FINDINGS: Endotracheal tube tip 3.6 cm from the carina. Enteric tube in place with tip and side-port below the diaphragm. Lung volumes are low. Heart is normal in size with unchanged mediastinal contours. There is mild perivascular haziness suggesting vascular congestion. Subsegmental opacities at the left lung base. No pleural fluid or pneumothorax. No acute osseous abnormalities are seen. IMPRESSION: 1. Endotracheal tube tip 3.6 cm from the carina. Enteric tube in place. 2. Low lung volumes with mild vascular congestion. Electronically Signed   By: Keith Rake M.D.   On: 09/16/2020 22:24   CT HEAD CODE STROKE WO CONTRAST  Addendum Date: 09/16/2020   ADDENDUM REPORT: 09/16/2020 21:33 ADDENDUM: Dr. Malen Gauze called to discuss the case. There could potentially be acute low-density in the left cerebellar peduncle that could indicate acute infarction in that region. There is some beam hardening in the area and I am not completely sure of that. Electronically Signed   By: Nelson Chimes M.D.   On: 09/16/2020 21:33   Result Date: 09/16/2020 CLINICAL DATA:  Code stroke. Left leg weakness. Speech disturbance. EXAM: CT HEAD WITHOUT CONTRAST TECHNIQUE: Contiguous axial images were obtained from the base of the skull through the vertex without intravenous contrast. COMPARISON:  06/30/2020 FINDINGS: Brain: There are old small vessel infarctions affecting the cerebellum. Chronic small-vessel changes are seen affecting the pons and midbrain. Old left thalamic lacunar infarction. Chronic small-vessel ischemic changes affect the cerebral hemispheric white matter. No sign of acute infarction, mass lesion, hemorrhage, hydrocephalus or extra-axial collection. Vascular: There is atherosclerotic calcification of the major vessels at the base of the brain. Skull:  Negative Sinuses/Orbits: Clear/normal Other: None ASPECTS ([place] Stroke Program Early CT Score) - Ganglionic level infarction (caudate, lentiform nuclei,  internal capsule, insula, M1-M3 cortex): 7 - Supraganglionic infarction (M4-M6 cortex): 3 Total score (0-10 with 10 being normal): 10 IMPRESSION: 1. No acute finding by CT. Old small vessel infarctions affecting the cerebellum and left thalamus. Chronic small-vessel ischemic changes of the cerebral hemispheric white matter. 2. ASPECTS is 10. 3. Call report in progress. Electronically Signed: By: Nelson Chimes M.D. On: 09/16/2020 21:09    Procedures .Critical Care Performed by: Lennice Sites, DO Authorized by: Lennice Sites, DO   Critical care provider statement:    Critical care time (minutes):  35   Critical care was necessary to treat or prevent imminent or life-threatening deterioration of the following conditions:  CNS failure or compromise   Critical care was time spent personally by me on the following activities:  Blood draw for specimens, development of treatment plan with patient or surrogate, discussions with primary provider, evaluation of patient's response to treatment, examination of patient, ordering and performing treatments and interventions, ordering and review of laboratory studies, ordering and review of radiographic studies, pulse oximetry, re-evaluation of patient's condition, review of old charts, discussions with consultants and obtaining history from patient or surrogate   I assumed direction of critical care for this patient from another provider in my specialty: no       Medications Ordered in ED Medications  LORazepam (ATIVAN) 2 MG/ML injection (has no administration in time range)  LORazepam (ATIVAN) 2 MG/ML injection (has no administration in time range)  propofol (DIPRIVAN) 1000 MG/100ML infusion (has no administration in time range)  rocuronium (ZEMURON) injection (80 mg Intravenous Given 09/16/20 2157)  propofol (DIPRIVAN) 1000 MG/100ML infusion (0 mcg/kg/min  82.7 kg Intravenous Stopped 09/16/20 2241)  levETIRAcetam (KEPPRA) IVPB 500 mg/100 mL premix (has no  administration in time range)  phenytoin (DILANTIN) injection 100 mg (has no administration in time range)  levETIRAcetam (KEPPRA) 2,500 mg in sodium chloride 0.9 % 250 mL IVPB (0 mg Intravenous Stopped 09/16/20 2146)  iohexol (OMNIPAQUE) 350 MG/ML injection 80 mL (80 mLs Intravenous Contrast Given 09/16/20 2117)  fosPHENYtoin (CEREBYX) 1,500 mg PE in sodium chloride 0.9 % 50 mL IVPB (0 mg PE Intravenous Stopped 09/16/20 2230)  etomidate (AMIDATE) injection (20 mg Intravenous Given 09/16/20 2152)    ED Course  I have reviewed the triage vital signs and the nursing notes.  Pertinent labs & imaging results that were available during my care of the patient were reviewed by me and considered in my medical decision making (see chart for details).    MDM Rules/Calculators/A&P                          Andres White is a 75 year old male with history of stroke presents the ED as code stroke.  However seizure activity upon arrival.  Suspect seizure at home and then postictal state.  Appears to be having ongoing seizure activity and was given IV Ativan.  Neurology was at the bedside upon arrival went directly to CT scan and no evidence of acute stroke on CT imaging.  No fever.  Normal white count.  Overall suspect status epilepticus given seizure-like activity for the span of maybe 2 hours.  Neurology loaded patient with fosphenytoin, Keppra.  Family was updated and patient is full code.  Patient was intubated for airway protection given poor GCS.  Patient was intubated with etomidate and rocuronium  with a grade 1 view by my resident and I supervised and was available for help.  Then had some post fosphenytoin and post intubation and post propofol hypotension that resolved with stopping propofol.  Will place in soft restraints and continue propofol once more hemodynamically stable.  Chest x-ray shows no infectious process.  Confirmed ET tube placement as well.  Patient has an anion gap elevation is likely from a  lactic acidosis from seizure activity.  But no concern for infectious process.  Patient to be admitted to the medical ICU for further care.  Will be set up for continuous EEG.  At this time do not believe patient is having any more seizures.  As gaze is more midline.  Neurology will continue to follow.  This chart was dictated using voice recognition software.  Despite best efforts to proofread,  errors can occur which can change the documentation meaning.    Final Clinical Impression(s) / ED Diagnoses Final diagnoses:  Status epilepticus [facility])    Rx / [place] Orders ED Discharge Orders    None       Lennice Sites, DO 09/16/20 2316

## 2020-09-16 NOTE — ED Triage Notes (Signed)
Brought in by [facility] from home, c/o altered mental status [id number]- family noticed pt becoming out of it and unresponsive.   Per family, pt had seizure like activity at home.

## 2020-09-16 NOTE — ED Notes (Signed)
Seizure like activity noted by Dr. Malen Gauze, with verbal orders made. Ativan 1mg  given IVP by Kerrie Pleasure.

## 2020-09-16 NOTE — ED Notes (Addendum)
Pt gazed to the left and started having seizure-like activity. Nystagmus and deviated gaze to left.

## 2020-09-16 NOTE — ED Notes (Signed)
Dr. Carson Myrtle at bedside.

## 2020-09-16 NOTE — Consult Note (Addendum)
Neurology Consultation  Reason for Consult: Altered mental status, code stroke Referring Physician: Dr. Ronnald Nian via [facility]  CC: Altered mental status, leftward gaze-code stroke  History is obtained from: Chart, patient's daughter  HPI: RAEL YO is a 75 y.o. 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 Upon EMS arrival, they noted the patient to have a leftward gaze, unresponsive to voice and not following commands with some possible left-sided weakness to noxious stimulation. Later on, the family, daughter [name], came to the hospital and described him being last known well right at the start of a football game which was at 6:30 PM and soon after acting out of sorts with gaze to the left and upwards. Soon after that he had some stiffening movement with frothing through his mouth and was unresponsive to voice. He did not come around and hence EMS was called. On EMS arrival, he continued to have a leftward gaze and some left-sided weakness that they had noted. He was brought into the emergency room as an acute code stroke. As he was being evaluated at the bridge as a code stroke, he had forced left gaze deviation, if really cry and stiffening of the whole body followed by generalized shaking that lasted for about 45 seconds to 1 minute and resolved spontaneously. He was given a milligram of Ativan because his gaze was still deviated to the left. This did not help his gaze. Keppra load was ordered. He was taken in for a stat CT head and CTA head and neck given the recent history of stroke. His gaze still remained fixed to the left but he was not clinically shaking or stiff. Keppra load of 2500 mg was initiated. His mentation continued to be worse. Intubation discussed with the family-family  agreeable In the interim, a fosphenytoin load of 1500 mg was also ordered. Patient is currently being intubated.   LKW: 6:30 PM on 09/16/2020 tpa given?: no, multiple seizures, less likely new stroke Premorbid modified Rankin scale (mRS): 2-3  ROS: Unable to perform due to patient's mentation According to the daughter, he was not sick. No history of fevers or chills preceding this hospital visit. No cough cold. No sick exposures. His appetite is normal and he had a meal prior to becoming unresponsive.  Past Medical History:  Diagnosis Date  . Arthritis   . Chronic back pain   . Gout   . Hypertension   . Stroke ([place]) 06/2020  . Syncope and collapse     Family History  Problem Relation Age of Onset  . Hypertension Mother      Social History:   reports that he has been smoking cigarettes. He has smoked for the past 20.00 years. He has never used smokeless tobacco. He reports previous alcohol use. He reports that he does not use drugs.  Medications  Current Facility-Administered Medications:  .  fosPHENYtoin (CEREBYX) 1,500 mg PE in sodium chloride 0.9 % 50 mL IVPB, 1,500 mg PE, Intravenous, Once, Amie Portland, MD .  LORazepam (ATIVAN) 2 MG/ML injection, , , ,  .  LORazepam (ATIVAN) 2 MG/ML injection, , , ,  .  propofol (DIPRIVAN) 1000 MG/100ML infusion, , , ,  .  rocuronium (ZEMURON) injection, , , PRN, Curatolo, Adam, DO, 80 mg at 09/16/20 2157  Current Outpatient Medications:  .  acetaminophen (TYLENOL)  325 MG tablet, Take 1-2 tablets (325-650 mg total) by mouth every 4 (four) hours as needed for mild pain., Disp: , Rfl:  .  aspirin EC 81 MG tablet, Take 1 tablet (81 mg total) by mouth daily., Disp: 90 tablet, Rfl: 3 .  atorvastatin (LIPITOR) 20 MG tablet, Take 40 mg by mouth daily., Disp: , Rfl:  .  citalopram (CELEXA) 20 MG tablet, Take 1 tablet (20 mg total) by mouth daily., Disp: 30 tablet, Rfl: 0 .  colchicine 0.6 MG tablet, Take 0.6 mg by mouth 2 (two) times daily as  needed., Disp: , Rfl:  .  diclofenac Sodium (VOLTAREN) 1 % GEL, Apply 2 g topically 4 (four) times daily., Disp: 100 g, Rfl: 1 .  gabapentin (NEURONTIN) 100 MG capsule, Take 1 capsule (100 mg total) by mouth 2 (two) times daily. (Patient not taking: Reported on 09/14/2020), Disp: 60 capsule, Rfl: 0 .  hydrocerin (EUCERIN) CREA, Apply 1 application topically 2 (two) times daily., Disp: , Rfl: 0 .  metoprolol succinate (TOPROL-XL) 25 MG 24 hr tablet, Take 1 tablet (25 mg total) by mouth daily. Take with or immediately following a meal., Disp: 90 tablet, Rfl: 1 .  Multiple Vitamin (MULTIVITAMIN WITH MINERALS) TABS tablet, Take 1 tablet by mouth daily., Disp: , Rfl:  .  pantoprazole (PROTONIX) 40 MG tablet, Take 1 tablet (40 mg total) by mouth 2 (two) times daily., Disp: 60 tablet, Rfl: 1 .  tamsulosin (FLOMAX) 0.4 MG CAPS capsule, Take 1 capsule (0.4 mg total) by mouth daily., Disp: 30 capsule, Rfl: 0   Exam: Current vital signs: BP (!) 179/98   Pulse 80   Temp 98.2 F (36.8 C) (Oral)   Resp 14   Ht 5\' 7"  (1.702 m)   Wt 82.7 kg   SpO2 99%   BMI 28.56 kg/m  Vital signs in last 24 hours: Temp:  [98.2 F (36.8 C)] 98.2 F (36.8 C) (02/13 2125) Pulse Rate:  [71-82] 80 (02/13 2158) Resp:  [14-31] 14 (02/13 2158) BP: (137-179)/(87-98) 179/98 (02/13 2158) SpO2:  [84 %-99 %] 99 % (02/13 2158) Weight:  [82.7 kg] 82.7 kg (02/13 2104) General: Extremely drowsy, in no apparent distress HEENT: Normocephalic atraumatic, no neck stiffness. Lungs: Clear Cardiovascular: Regular rate rhythm Abdomen soft nondistended nontender Extremities with 2+ pitting edema Neurological exam Extremely drowsy, opens eyes to voice initially answered his name Jacson To other questions, kept repeating and perseverating his name. Moderately dysarthric speech Extremely poor attention concentration Cranial nerves: Pupils are equal round reactive to light, he has forced leftward gaze preference, does not blink to threat  from either side, edentulous face-hard to ascertain symmetry. Motor examination: Grimaces to noxious stimulation in all fours. Mild withdrawal in all fours to noxious stimulation. Sensory exam: As above Coordination cannot be tested due to his mentation.  Labs I have reviewed labs in epic and the results pertinent to this consultation are:  CBC    Component Value Date/Time   WBC 9.4 09/16/2020 2147   RBC 3.92 (L) 09/16/2020 2147   HGB 13.3 09/16/2020 2157   HGB 12.6 (L) 03/23/2014 2009   HCT 39.0 09/16/2020 2157   HCT 39.7 (L) 03/23/2014 2009   PLT 236 09/16/2020 2147   PLT 176 03/23/2014 2009   MCV 98.2 09/16/2020 2147   MCV 95 03/23/2014 2009   MCH 28.6 09/16/2020 2147   MCHC 29.1 (L) 09/16/2020 2147   RDW 17.3 (H) 09/16/2020 2147   RDW 14.1 03/23/2014 2009   LYMPHSABS  4.6 (H) 09/16/2020 2147   LYMPHSABS 2.9 03/23/2014 2009   MONOABS 0.5 09/16/2020 2147   MONOABS 0.5 03/23/2014 2009   EOSABS 0.1 09/16/2020 2147   EOSABS 0.1 03/23/2014 2009   BASOSABS 0.0 09/16/2020 2147   BASOSABS 0.0 03/23/2014 2009    CMP     Component Value Date/Time   NA 140 09/16/2020 2157   NA 134 (L) 03/23/2014 2009   K 3.2 (L) 09/16/2020 2157   K 3.5 03/23/2014 2009   CL 108 09/16/2020 2157   CL 106 03/23/2014 2009   CO2 24 07/16/2020 0511   CO2 17 (L) 03/23/2014 2009   GLUCOSE 113 (H) 09/16/2020 2157   GLUCOSE 116 (H) 03/23/2014 2009   BUN 10 09/16/2020 2157   BUN 36 (H) 03/23/2014 2009   CREATININE 1.30 (H) 09/16/2020 2157   CREATININE 1.88 (H) 03/23/2014 2009   CALCIUM 8.7 (L) 07/16/2020 0511   CALCIUM 8.9 03/23/2014 2009   PROT 7.0 06/28/2020 0407   PROT 8.2 01/06/2014 1157   ALBUMIN 3.3 (L) 06/28/2020 0407   ALBUMIN 3.5 01/06/2014 1157   AST 36 06/28/2020 0407   AST 51 (H) 01/06/2014 1157   ALT 30 06/28/2020 0407   ALT 43 01/06/2014 1157   ALKPHOS 65 06/28/2020 0407   ALKPHOS 76 01/06/2014 1157   BILITOT 1.2 06/28/2020 0407   BILITOT 0.4 01/06/2014 1157   GFRNONAA 49  (L) 07/16/2020 0511   GFRNONAA 35 (L) 03/23/2014 2009   GFRAA 32 (L) 11/18/2017 1830   GFRAA 41 (L) 03/23/2014 2009   Imaging I have reviewed the images obtained:  CT-scan of the brain-initially read as no acute changes but there is a acute low-density in the left cerebellar peduncle that could indicate acute infarction in that region. There is some beam hardening artifact in that area hence not entirely sure of the significance of that hypodensity. Multiple old strokes  In both hemispheres including lacunes.  CTA Head + Neck:  Calcified plaque at both carotid 5 locations with 20% stenosis of the proximal ICA on the right, 70% stenosis of the proximal ICA on the left. 50% stenosis of the origin of the nondominant right vertebral artery. Extensive calcified plaque at the proximal dominant left vert with severe stenosis-nearly 90%. Both vessels are patent beyond the proximal stenosis to the basilar. No intracranial large or medium vessel occlusion or correctable proximal stenosis. Hazy density in the posterior right upper lobe could indicate bronchopneumonia.   Assessment: 75 year old man with history documented above, recent stroke in November 2021, presenting with concern for stroke with EMS but on further history taking, appears that he had a seizure at home and had another seizure in the emergency room while he was being evaluated. No history of seizures but has a history of cognitive decline and history of brainstem stroke in November 2021 and based on imaging possibly prior strokes as well. Was loaded with Keppra 2500 mg after receiving 2 mg of Ativan which did not break his leftward gaze although he had stopped shaking and was not stiff. Fosphenytoin 1500 mg ordered after that, which she is receiving and at this point, requires emergent intubation for airway protection. Once he is intubated, he would be moved to the ICU.   No fever, no neck stiffness, no leukocytosis - low suspicion for  CNS infection. Will not recommend LP for now.   Impression: New onset seizure and status epilepticus History of strokes   Recommendations: Loaded with Keppra and fosphenytoin Continue Keppra 500 twice  daily 12 hours from the load Continue Dilantin 100 3 times daily 8 hours from the load Check Dilantin level 2 hours after the fosphenytoin load Start on propofol after intubation Stat cEEG - technologist notified. If he continues to show seizure activity on the EEG, will consider loading with phenobarbital. Vent management per primary team MRI brain after cEEG is completed and seizures/status is well controlled.  Discussed my plan in detail with Dr. Lanny Hurst and Dr. Ronnald Nian in the ER. Discussed in detail with the daughter [name] in the ER. Neurology will continue to follow.   Addendum 2359hrs Preliminary read EEG-slowing.  No seizures. The above course of treatment  -- Amie Portland, MD Neurologist [facility] Pager: [phone]  CRITICAL CARE ATTESTATION Performed by: Amie Portland, MD Total critical care time: 80 minutes Critical care time was exclusive of separately billable procedures and treating other patients and/or supervising APPs/Residents/Students Critical care was necessary to treat or prevent imminent or life-threatening deterioration due to new onset seizure, status epilepticus. This patient is critically ill and at significant risk for neurological worsening and/or death and care requires constant monitoring. Critical care was time spent personally by me on the following activities: development of treatment plan with patient and/or surrogate as well as nursing, discussions with consultants, evaluation of patient's response to treatment, examination of patient, obtaining history from patient or surrogate, ordering and performing treatments and interventions, ordering and review of laboratory studies, ordering and review of radiographic studies, pulse  oximetry, re-evaluation of patient's condition, participation in multidisciplinary rounds and medical decision making of high complexity in the care of this patient.

## 2020-09-16 NOTE — ED Notes (Signed)
Shanon Brow RN at bedside trying to place another IV on pt. Lab at bedside for 2nd attempt at blood draws.

## 2020-09-16 NOTE — ED Notes (Signed)
Ativan 1mg  IVP given by Daivd RN.

## 2020-09-16 NOTE — Progress Notes (Signed)
   09/16/20 2100  Clinical Encounter Type  Visited With Family  Visit Type Initial  Referral From Nurse  Consult/Referral To Chaplain  Spiritual Encounters  Spiritual Needs Emotional  The chaplain responded to page to provide the family of the patient support. The family is worried. The chaplain spoke to the family and informed the family of presence of the chaplain in the hospital overnight. The family will contact the chaplain if additional support is needed. The chaplain will follow up as needed.

## 2020-09-16 NOTE — ED Provider Notes (Signed)
Procedure Name: Intubation Date/Time: 09/16/2020 10:05 PM Performed by: Christy Gentles, MD Pre-anesthesia Checklist: Patient identified, Patient being monitored, Timeout performed, Emergency Drugs available and Suction available Oxygen Delivery Method: Ambu bag Preoxygenation: Pre-oxygenation with 100% oxygen Induction Type: Rapid sequence Ventilation: Mask ventilation without difficulty Laryngoscope Size: Glidescope Grade View: Grade I Tube size: 7.5 mm Number of attempts: 1 Airway Equipment and Method: Stylet and Video-laryngoscopy Placement Confirmation: ETT inserted through vocal cords under direct vision,  Positive ETCO2,  CO2 detector and Breath sounds checked- equal and bilateral Secured at: 23 cm Tube secured with: ETT holder Dental Injury: Teeth and Oropharynx as per pre-operative assessment  Comments: ETT intubation for airway protection in setting of status epilepticus. Procedure performed under direct supervision of Dr. Ronnald Nian (ED Attending).        Christy Gentles, MD 09/16/20 [place], [place], DO 09/16/20 2316

## 2020-09-16 NOTE — Progress Notes (Signed)
Pharmacy Antibiotic Note  Andres White is a 75 y.o. male admitted on 09/16/2020 with seizures.  Pharmacy has been consulted for Zosyn dosing for aspiration pneumonia. WBC ok. Mild bump in Scr.   Plan: Zosyn 3.375G IV q8h to be infused over 4 hours Trend WBC, temp, renal function  F/U infectious work-up   Height: 5\' 7"  (170.2 cm) Weight: 82.7 kg (182 lb 5.1 oz) IBW/kg (Calculated) : 66.1  Temp (24hrs), Avg:98.2 F (36.8 C), Min:98.2 F (36.8 C), Max:98.2 F (36.8 C)  Recent Labs  Lab 09/16/20 2147 09/16/20 2157  WBC 9.4  --   CREATININE 1.48* 1.30*    Estimated Creatinine Clearance: 48.9 mL/min (A) (by C-G formula based on SCr of 1.3 mg/dL (H)).    No Known Allergies  Narda Bonds, PharmD, BCPS Clinical Pharmacist Phone: [phone]

## 2020-09-16 NOTE — ED Notes (Signed)
Pt will be intubated, RT at bedside. Dr. Ronnald Nian at bedside.

## 2020-09-16 NOTE — ED Notes (Signed)
BVM ongoing.

## 2020-09-16 NOTE — H&P (Addendum)
NAME:  Andres White MRN:  [id number] DOB:  [birth year] LOS: 0 ADMISSION DATE:  09/16/2020 DATE OF SERVICE:  09/16/2020  CHIEF COMPLAINT:  Status epilepticus   HISTORY & PHYSICAL  History of Present Illness  This 75 y.o. African-American male presented to the [facility] Emergency Department via EMS with complaints of observed seizure activity at home.  Indeed, the patient had 2 additional seizure episodes while in the emergency department.  He has a known history of previous stroke; however, he has never been diagnosed with seizures.  He was intubated in the emergency department in a postictal state.  He has been loaded with Keppra and Dilantin.  Neurology is following.  Plans for EEG noted.  At the time of clinical interview, the patient is still under the effects of RSI medications for intubation.  Propofol was started after intubation but is currently on hold due to hypotension.  Per verbal report from ER team, the patient initially presented with lateral gaze preference and intermittent seizure-like activity.  He was treated with Ativan.  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  REVIEW OF SYSTEMS This patient is critically ill and cannot provide additional history nor review of systems due to mental status/unconsciousness and endotracheally intubated.   Past Medical/Surgical/Social/Family History   Past Medical History:  Diagnosis Date  . Arthritis   . Chronic back pain   . Gout   . Hypertension   . Stroke ([place]) 06/2020  . Syncope and collapse     Past Surgical History:  Procedure Laterality Date  . BIOPSY  07/13/2020   Procedure: BIOPSY;  Surgeon: Ladene Artist, MD;  Location: [facility] ENDOSCOPY;  Service: Endoscopy;;  . CARDIAC CATHETERIZATION  2015   [facility]   . COLONOSCOPY WITH PROPOFOL N/A 07/13/2020    Procedure: COLONOSCOPY WITH PROPOFOL;  Surgeon: Ladene Artist, MD;  Location: [facility] ENDOSCOPY;  Service: Endoscopy;  Laterality: N/A;  . ESOPHAGOGASTRODUODENOSCOPY N/A 07/13/2020   Procedure: ESOPHAGOGASTRODUODENOSCOPY (EGD);  Surgeon: Ladene Artist, MD;  Location: [facility] ENDOSCOPY;  Service: Endoscopy;  Laterality: N/A;  . KNEE SURGERY    . POLYPECTOMY  07/13/2020   Procedure: POLYPECTOMY;  Surgeon: Ladene Artist, MD;  Location: [facility] ENDOSCOPY;  Service: Endoscopy;;    Social History   Tobacco Use  . Smoking status: Current Every Day Smoker    Years: 20.00    Types: Cigarettes  . Smokeless tobacco: Never Used  Substance Use Topics  . Alcohol use: Not Currently    Comment: occasional    Family History  Problem Relation Age of Onset  . Hypertension Mother      Procedures:  2/13: Endotracheal intubation in ER   Significant Diagnostic Tests:  2/13: CTA head/neck shows aortic atherosclerosis; calcified plaque in both carotid bifurcations (20% right proximal ICA stenosis, 70% left proximal ICA stenosis); 50% stenosis of the nondominant right vertebral artery, severe (~90%) stenosis of the left vertebral artery; advanced, chronic cervical spondylosis; hazy density in the right upper lobe (lung).  Micro Data:   Results for orders placed or performed during the hospital encounter of 06/27/20  Urine Culture     Status: Abnormal   Collection Time: 07/01/20  3:21 AM   Specimen: Urine, Random  Result Value Ref Range Status   Specimen Description URINE, RANDOM  Final   Special Requests NONE  Final   Culture (  A)  Final    40,000 COLONIES/mL ENTEROCOCCUS FAECALIS >=100,000 COLONIES/mL AEROCOCCUS SPECIES Standardized susceptibility testing for this organism is not available. Performed at [facility], [place] [address], [place], [place] [address]    Report Status 07/04/2020 FINAL  Final   Organism ID, Bacteria ENTEROCOCCUS FAECALIS (A)  Final      Susceptibility   Enterococcus  faecalis - MIC*    AMPICILLIN <=2 SENSITIVE Sensitive     NITROFURANTOIN <=16 SENSITIVE Sensitive     VANCOMYCIN 2 SENSITIVE Sensitive     * 40,000 COLONIES/mL ENTEROCOCCUS FAECALIS  Urine Culture     Status: Abnormal   Collection Time: 07/12/20 11:07 AM   Specimen: Urine, Clean Catch  Result Value Ref Range Status   Specimen Description URINE, CLEAN CATCH  Final   Special Requests NONE  Final   Culture (A)  Final    20,000 COLONIES/mL ENTEROCOCCUS FAECALIS 80,000 COLONIES/mL AEROCOCCUS URINAE Standardized susceptibility testing for this organism is not available. Performed at [facility], [place] [address], [place], [place] [address]    Report Status 07/16/2020 FINAL  Final   Organism ID, Bacteria ENTEROCOCCUS FAECALIS (A)  Final      Susceptibility   Enterococcus faecalis - MIC*    AMPICILLIN <=2 SENSITIVE Sensitive     NITROFURANTOIN <=16 SENSITIVE Sensitive     VANCOMYCIN 2 SENSITIVE Sensitive     * 20,000 COLONIES/mL ENTEROCOCCUS FAECALIS      Antimicrobials:   N/A   Interim history/subjective:  N/A   Objective   BP (!) 71/58 (BP Location: Left Arm)   Pulse (!) 56   Temp 98.2 F (36.8 C) (Oral)   Resp 20   Ht 5\' 7"  (1.702 m)   Wt 82.7 kg   SpO2 100%   BMI 28.56 kg/m     Filed Weights   09/16/20 2104  Weight: 82.7 kg   No intake or output data in the 24 hours ending 09/16/20 2254  Vent Mode: PRVC FiO2 (%):  [100 %] 100 % Set Rate:  [20 bmp] 20 bmp Vt Set:  [520 mL] 520 mL PEEP:  [5 cmH20] 5 cmH20 Plateau Pressure:  [15 cmH20] 15 cmH20   Examination: GENERAL: Intubated.  Still under the effects of RSI medications. No acute distress. HEAD: normocephalic, atraumatic EYE: No gaze preference.  Pupils equal.  No scleral icterus.  No pallor.  Arcus senilis. THROAT/ORAL CAVITY: ETT in situ. NECK: supple, no thyromegaly, no JVD, no lymphadenopathy. Trachea midline. CHEST/LUNG: symmetric in development and expansion. Good air entry.  Bilateral rales  and rhonchi.   HEART: Regular S1 and S2 without murmur, rub or gallop. ABDOMEN: soft, nontender, nondistended. Normoactive bowel sounds. No rebound. No guarding. No hepatosplenomegaly. EXTREMITIES: Edema: Trace. No cyanosis. No clubbing. 2+ DP pulses LYMPHATIC: no cervical/axillary/inguinal lymph nodes appreciated MUSCULOSKELETAL: No bulk atrophy. Joints: normal inspection.  SKIN:  No rash or lesion. NEUROLOGIC: still under the effects of RSI medications.  Synchronous with ventilator.  No cough.  Facial symmetry.  Resolved Hospital Problem list   N/A   Assessment & Plan:   ASSESSMENT/PLAN:  ASSESSMENT (included in the Hospital Problem List)  Principal Problem:   Status epilepticus ([place]) Active Problems:   Endotracheally intubated   Community acquired pneumonia   CKD (chronic kidney disease) stage 3, GFR 30-59 ml/min (HCC)   Left carotid stenosis   Cervical spondylosis   Vertebral artery stenosis   By systems: PULMONARY  Endotracheal intubation  Risk for aspiration  Right upper  lobe infiltrate: Possible aspiration pneumonia. VAP bundle. Hold propofol for now. Empiric Zosyn.   CARDIOVASCULAR  Hypotension Improving off propofol. IV fluid (normal saline) 1 L bolus.   RENAL  CKD stage IIIa  IV dye exposure today (2/13) for CTA head/neck Monitor urine output. Monitor serum creatinine. Avoid nephrotoxic drugs. Renal dosing of medications.   GASTROINTESTINAL: No acute issues  GI PROPHYLAXIS: Protonix   HEMATOLOGIC  Relative lymphocytosis, unknown clinical significance  DVT PROPHYLAXIS: Heparin   INFECTIOUS: No acute issues   ENDOCRINE: No acute issues   NEUROLOGIC  Status epilepticus On Keppra, Dilantin per neurology. Plans for EEG noted. Neurology help appreciated.  PLAN/RECOMMENDATIONS   Admit to ICU under my service (Attending: Renee Pain, MD) with the diagnoses highlighted above in the active Hospital Problem List (ASSESSMENT).  Start  tube feeds.    My assessment, plan of care, findings, medications, side effects, etc. were discussed with: nurse.   Best practice:  Diet: Tube feeds Pain/Anxiety/Delirium protocol (if indicated): Yes VAP protocol (if indicated): Yes DVT prophylaxis: Heparin GI prophylaxis: Protonix Glucose control: Accu-Cheks.  No history of diabetes Mobility/Activity: Bedrest CODE STATUS: Full code   Code Status: Prior Family Communication:  No family at bedside Disposition: Admit to ICU (4N)   Labs   CBC: Recent Labs  Lab 09/16/20 2147 09/16/20 2157  WBC 9.4  --   NEUTROABS 4.1  --   HGB 11.2* 13.3  HCT 38.5* 39.0  MCV 98.2  --   PLT 236  --     Basic Metabolic Panel: Recent Labs  Lab 09/16/20 2147 09/16/20 2157  NA 140 140  K 3.2* 3.2*  CL 105 108  CO2 13*  --   GLUCOSE 126* 113*  BUN 9 10  CREATININE 1.48* 1.30*  CALCIUM 9.0  --    GFR: Estimated Creatinine Clearance: 48.9 mL/min (A) (by C-G formula based on SCr of 1.3 mg/dL (H)). Recent Labs  Lab 09/16/20 2147  WBC 9.4    Liver Function Tests: Recent Labs  Lab 09/16/20 2147  AST 31  ALT 14  ALKPHOS 78  BILITOT 0.7  PROT 7.1  ALBUMIN 3.1*   No results for input(s): LIPASE, AMYLASE in the last 168 hours. No results for input(s): AMMONIA in the last 168 hours.  ABG    Component Value Date/Time   TCO2 14 (L) 09/16/2020 2157     Coagulation Profile: Recent Labs  Lab 09/16/20 2147  INR 1.3*    Cardiac Enzymes: No results for input(s): CKTOTAL, CKMB, CKMBINDEX, TROPONINI in the last 168 hours.  HbA1C: Hgb A1c MFr Bld  Date/Time Value Ref Range Status  06/25/2020 04:41 AM 4.8 4.8 - 5.6 % Final    Comment:    (NOTE) Pre diabetes:          5.7%-6.4%  Diabetes:              >6.4%  Glycemic control for   <7.0% adults with diabetes     CBG: Recent Labs  Lab 09/16/20 2050  GLUCAP 201*     Past Medical History   Past Medical History:  Diagnosis Date  . Arthritis   . Chronic back  pain   . Gout   . Hypertension   . Stroke ([place]) 06/2020  . Syncope and collapse       Surgical History    Past Surgical History:  Procedure Laterality Date  . BIOPSY  07/13/2020   Procedure: BIOPSY;  Surgeon: Ladene Artist, MD;  Location: [facility]  ENDOSCOPY;  Service: Endoscopy;;  . CARDIAC CATHETERIZATION  2015   [facility]   . COLONOSCOPY WITH PROPOFOL N/A 07/13/2020   Procedure: COLONOSCOPY WITH PROPOFOL;  Surgeon: Ladene Artist, MD;  Location: [facility] ENDOSCOPY;  Service: Endoscopy;  Laterality: N/A;  . ESOPHAGOGASTRODUODENOSCOPY N/A 07/13/2020   Procedure: ESOPHAGOGASTRODUODENOSCOPY (EGD);  Surgeon: Ladene Artist, MD;  Location: [facility] ENDOSCOPY;  Service: Endoscopy;  Laterality: N/A;  . KNEE SURGERY    . POLYPECTOMY  07/13/2020   Procedure: POLYPECTOMY;  Surgeon: Ladene Artist, MD;  Location: [facility] ENDOSCOPY;  Service: Endoscopy;;      Social History   Social History   Socioeconomic History  . Marital status: Single    Spouse name: Not on file  . Number of children: Not on file  . Years of education: Not on file  . Highest education level: Not on file  Occupational History  . Not on file  Tobacco Use  . Smoking status: Current Every Day Smoker    Years: 20.00    Types: Cigarettes  . Smokeless tobacco: Never Used  Vaping Use  . Vaping Use: Never used  Substance and Sexual Activity  . Alcohol use: Not Currently    Comment: occasional  . Drug use: No  . Sexual activity: Not on file  Other Topics Concern  . Not on file  Social History Narrative   Lives with daughter, son in law and 2 grandchildren   Right Handed   Drinks 1 cup caffeine 4 times a week-ish   Social Determinants of Health   Financial Resource Strain: Not on file  Food Insecurity: Not on file  Transportation Needs: Not on file  Physical Activity: Not on file  Stress: Not on file  Social Connections: Not on file      Family History    Family History  Problem Relation Age of Onset  .  Hypertension Mother    family history includes Hypertension in his mother.    Allergies No Known Allergies    Current Medications  Current Facility-Administered Medications:  .  [START ON 09/17/2020] levETIRAcetam (KEPPRA) IVPB 500 mg/100 mL premix, 500 mg, Intravenous, Q12H, Amie Portland, MD .  LORazepam (ATIVAN) 2 MG/ML injection, , , ,  .  LORazepam (ATIVAN) 2 MG/ML injection, , , ,  .  [START ON 09/17/2020] phenytoin (DILANTIN) injection 100 mg, 100 mg, Intravenous, Q8H, Amie Portland, MD .  propofol (DIPRIVAN) 1000 MG/100ML infusion, , , ,  .  propofol (DIPRIVAN) 1000 MG/100ML infusion, 5-80 mcg/kg/min, Intravenous, Titrated, Curatolo, Adam, DO, Stopped at 09/16/20 2241 .  rocuronium (ZEMURON) injection, , , PRN, Lennice Sites, DO, 80 mg at 09/16/20 2157  Current Outpatient Medications:  .  acetaminophen (TYLENOL) 325 MG tablet, Take 1-2 tablets (325-650 mg total) by mouth every 4 (four) hours as needed for mild pain., Disp: , Rfl:  .  aspirin EC 81 MG tablet, Take 1 tablet (81 mg total) by mouth daily., Disp: 90 tablet, Rfl: 3 .  atorvastatin (LIPITOR) 20 MG tablet, Take 40 mg by mouth daily., Disp: , Rfl:  .  citalopram (CELEXA) 20 MG tablet, Take 1 tablet (20 mg total) by mouth daily., Disp: 30 tablet, Rfl: 0 .  colchicine 0.6 MG tablet, Take 0.6 mg by mouth 2 (two) times daily as needed., Disp: , Rfl:  .  diclofenac Sodium (VOLTAREN) 1 % GEL, Apply 2 g topically 4 (four) times daily., Disp: 100 g, Rfl: 1 .  gabapentin (NEURONTIN) 100 MG capsule, Take 1 capsule (100  mg total) by mouth 2 (two) times daily. (Patient not taking: Reported on 09/14/2020), Disp: 60 capsule, Rfl: 0 .  hydrocerin (EUCERIN) CREA, Apply 1 application topically 2 (two) times daily., Disp: , Rfl: 0 .  metoprolol succinate (TOPROL-XL) 25 MG 24 hr tablet, Take 1 tablet (25 mg total) by mouth daily. Take with or immediately following a meal., Disp: 90 tablet, Rfl: 1 .  Multiple Vitamin (MULTIVITAMIN WITH  MINERALS) TABS tablet, Take 1 tablet by mouth daily., Disp: , Rfl:  .  pantoprazole (PROTONIX) 40 MG tablet, Take 1 tablet (40 mg total) by mouth 2 (two) times daily., Disp: 60 tablet, Rfl: 1 .  tamsulosin (FLOMAX) 0.4 MG CAPS capsule, Take 1 capsule (0.4 mg total) by mouth daily., Disp: 30 capsule, Rfl: 0   Home Medications  Prior to Admission medications   Medication Sig Start Date End Date Taking? Authorizing Provider  acetaminophen (TYLENOL) 325 MG tablet Take 1-2 tablets (325-650 mg total) by mouth every 4 (four) hours as needed for mild pain. 07/05/20   Love, [name], PA-C  aspirin EC 81 MG tablet Take 1 tablet (81 mg total) by mouth daily. 07/18/20   Love, [name], PA-C  atorvastatin (LIPITOR) 20 MG tablet Take 40 mg by mouth daily.    [provider]  citalopram (CELEXA) 20 MG tablet Take 1 tablet (20 mg total) by mouth daily. 07/18/20   Love, [name], PA-C  colchicine 0.6 MG tablet Take 0.6 mg by mouth 2 (two) times daily as needed.    [provider]  diclofenac Sodium (VOLTAREN) 1 % GEL Apply 2 g topically 4 (four) times daily. 07/18/20   Love, [name], PA-C  gabapentin (NEURONTIN) 100 MG capsule Take 1 capsule (100 mg total) by mouth 2 (two) times daily. Patient not taking: Reported on 09/14/2020 07/18/20   Love, [name], PA-C  hydrocerin (EUCERIN) CREA Apply 1 application topically 2 (two) times daily. 07/18/20   Love, [name], PA-C  metoprolol succinate (TOPROL-XL) 25 MG 24 hr tablet Take 1 tablet (25 mg total) by mouth daily. Take with or immediately following a meal. 08/06/20 11/04/20  O'Neal, Cassie Freer, MD  Multiple Vitamin (MULTIVITAMIN WITH MINERALS) TABS tablet Take 1 tablet by mouth daily. 07/19/20   Love, [name], PA-C  pantoprazole (PROTONIX) 40 MG tablet Take 1 tablet (40 mg total) by mouth 2 (two) times daily. 07/18/20   Love, [name], PA-C  tamsulosin (FLOMAX) 0.4 MG CAPS capsule Take 1 capsule (0.4 mg total) by mouth daily. 07/18/20   Bary Leriche, PA-C      Critical care time: 30 minutes.  The treatment and management of the patient's condition was required based on the threat of imminent deterioration. This time reflects time spent by the physician evaluating, providing care and managing the critically ill patient's care. The time was spent at the immediate bedside (or on the same floor/unit and dedicated to this patient's care). Time involved in separately billable procedures is NOT included int he critical care time indicated above. Family meeting and update time may be included above if and only if the patient is unable/incompetent to participate in clinical interview and/or decision making, and the discussion was necessary to determining treatment decisions.   Renee Pain, MD Board Certified by the ABIM, [place]

## 2020-09-16 NOTE — ED Notes (Signed)
ED Provider Dr. Ronnald Nian and Dr. Malen Gauze (neuro) at bedside.

## 2020-09-16 NOTE — ED Notes (Signed)
COVID swab done and given to [place] NT.

## 2020-09-16 NOTE — ED Notes (Signed)
Ativan 1mg  given IVP by Kerrie Pleasure.

## 2020-09-16 NOTE — ED Notes (Signed)
Lab at bedside tried sticking pt for blood draws but unable to at this time.

## 2020-09-16 NOTE — ED Notes (Signed)
Pt hypotensive, Dr. Ronnald Nian made aware.

## 2020-09-16 NOTE — ED Notes (Signed)
Pt arrival.

## 2020-09-17 ENCOUNTER — Other Ambulatory Visit: Payer: Self-pay

## 2020-09-17 ENCOUNTER — Inpatient Hospital Stay (HOSPITAL_COMMUNITY): Payer: Medicare Other

## 2020-09-17 ENCOUNTER — Encounter (HOSPITAL_COMMUNITY): Payer: Self-pay | Admitting: Pulmonary Disease

## 2020-09-17 DIAGNOSIS — G40901 Epilepsy, unspecified, not intractable, with status epilepticus: Secondary | ICD-10-CM | POA: Diagnosis not present

## 2020-09-17 LAB — BASIC METABOLIC PANEL
Anion gap: 12 (ref 5–15)
BUN: 8 mg/dL (ref 8–23)
CO2: 22 mmol/L (ref 22–32)
Calcium: 8.8 mg/dL — ABNORMAL LOW (ref 8.9–10.3)
Chloride: 108 mmol/L (ref 98–111)
Creatinine, Ser: 1.34 mg/dL — ABNORMAL HIGH (ref 0.61–1.24)
GFR, Estimated: 55 mL/min — ABNORMAL LOW (ref >60)
Glucose, Bld: 121 mg/dL — ABNORMAL HIGH (ref 70–99)
Potassium: 3 mmol/L — ABNORMAL LOW (ref 3.5–5.1)
Sodium: 142 mmol/L (ref 135–145)

## 2020-09-17 LAB — URINALYSIS, ROUTINE W REFLEX MICROSCOPIC
Bacteria, UA: NONE SEEN
Bilirubin Urine: NEGATIVE
Glucose, UA: NEGATIVE mg/dL
Ketones, ur: 5 mg/dL — AB
Leukocytes,Ua: NEGATIVE
Nitrite: NEGATIVE
Protein, ur: NEGATIVE mg/dL
Specific Gravity, Urine: 1.009 (ref 1.005–1.030)
pH: 7 (ref 5.0–8.0)

## 2020-09-17 LAB — PHOSPHORUS
Phosphorus: 2.7 mg/dL (ref 2.5–4.6)
Phosphorus: 3.3 mg/dL (ref 2.5–4.6)

## 2020-09-17 LAB — POCT I-STAT 7, (LYTES, BLD GAS, ICA,H+H)
Acid-Base Excess: 0 mmol/L (ref 0.0–2.0)
Acid-base deficit: 4 mmol/L — ABNORMAL HIGH (ref 0.0–2.0)
Bicarbonate: 20.5 mmol/L (ref 20.0–28.0)
Bicarbonate: 23.3 mmol/L (ref 20.0–28.0)
Calcium, Ion: 1.2 mmol/L (ref 1.15–1.40)
Calcium, Ion: 1.19 mmol/L (ref 1.15–1.40)
HCT: 33 % — ABNORMAL LOW (ref 39.0–52.0)
HCT: 32 % — ABNORMAL LOW (ref 39.0–52.0)
Hemoglobin: 11.2 g/dL — ABNORMAL LOW (ref 13.0–17.0)
Hemoglobin: 10.9 g/dL — ABNORMAL LOW (ref 13.0–17.0)
O2 Saturation: 69 %
O2 Saturation: 100 %
Patient temperature: 97.4
Patient temperature: 97.4
Potassium: 2.8 mmol/L — ABNORMAL LOW (ref 3.5–5.1)
Potassium: 2.7 mmol/L — CL (ref 3.5–5.1)
Sodium: 141 mmol/L (ref 135–145)
Sodium: 141 mmol/L (ref 135–145)
TCO2: 22 mmol/L (ref 22–32)
TCO2: 24 mmol/L (ref 22–32)
pCO2 arterial: 32.5 mmHg (ref 32.0–48.0)
pCO2 arterial: 32.6 mmHg (ref 32.0–48.0)
pH, Arterial: 7.407 (ref 7.350–7.450)
pH, Arterial: 7.459 — ABNORMAL HIGH (ref 7.350–7.450)
pO2, Arterial: 34 mmHg — CL (ref 83.0–108.0)
pO2, Arterial: 316 mmHg — ABNORMAL HIGH (ref 83.0–108.0)

## 2020-09-17 LAB — GLUCOSE, CAPILLARY
Glucose-Capillary: 117 mg/dL — ABNORMAL HIGH (ref 70–99)
Glucose-Capillary: 116 mg/dL — ABNORMAL HIGH (ref 70–99)
Glucose-Capillary: 136 mg/dL — ABNORMAL HIGH (ref 70–99)

## 2020-09-17 LAB — I-STAT ARTERIAL BLOOD GAS, ED
Acid-base deficit: 4 mmol/L — ABNORMAL HIGH (ref 0.0–2.0)
Bicarbonate: 19.6 mmol/L — ABNORMAL LOW (ref 20.0–28.0)
Calcium, Ion: 1.14 mmol/L — ABNORMAL LOW (ref 1.15–1.40)
HCT: 34 % — ABNORMAL LOW (ref 39.0–52.0)
Hemoglobin: 11.6 g/dL — ABNORMAL LOW (ref 13.0–17.0)
O2 Saturation: 100 %
Patient temperature: 98.2
Potassium: 2.6 mmol/L — CL (ref 3.5–5.1)
Sodium: 142 mmol/L (ref 135–145)
TCO2: 20 mmol/L — ABNORMAL LOW (ref 22–32)
pCO2 arterial: 29 mmHg — ABNORMAL LOW (ref 32.0–48.0)
pH, Arterial: 7.437 (ref 7.350–7.450)
pO2, Arterial: 267 mmHg — ABNORMAL HIGH (ref 83.0–108.0)

## 2020-09-17 LAB — MAGNESIUM
Magnesium: 1.9 mg/dL (ref 1.7–2.4)
Magnesium: 1.9 mg/dL (ref 1.7–2.4)
Magnesium: 2 mg/dL (ref 1.7–2.4)

## 2020-09-17 LAB — CBC
HCT: 36.9 % — ABNORMAL LOW (ref 39.0–52.0)
Hemoglobin: 11.1 g/dL — ABNORMAL LOW (ref 13.0–17.0)
MCH: 27.8 pg (ref 26.0–34.0)
MCHC: 30.1 g/dL (ref 30.0–36.0)
MCV: 92.3 fL (ref 80.0–100.0)
Platelets: 221 10*3/uL (ref 150–400)
RBC: 4 MIL/uL — ABNORMAL LOW (ref 4.22–5.81)
RDW: 17.2 % — ABNORMAL HIGH (ref 11.5–15.5)
WBC: 7.8 10*3/uL (ref 4.0–10.5)
nRBC: 0 % (ref 0.0–0.2)

## 2020-09-17 LAB — TRIGLYCERIDES: Triglycerides: 97 mg/dL (ref <150)

## 2020-09-17 LAB — PROCALCITONIN: Procalcitonin: <0.1 ng/mL

## 2020-09-17 LAB — PHENYTOIN LEVEL, TOTAL: Phenytoin Lvl: 15.5 ug/mL (ref 10.0–20.0)

## 2020-09-17 LAB — MRSA PCR SCREENING: MRSA by PCR: NEGATIVE

## 2020-09-17 LAB — POTASSIUM: Potassium: 3 mmol/L — ABNORMAL LOW (ref 3.5–5.1)

## 2020-09-17 MED ORDER — ASPIRIN 81 MG PO CHEW
81.0000 mg | CHEWABLE_TABLET | Freq: Every day | ORAL | Status: DC
  Administered 2020-09-17 – 2020-09-18 (×2): 81 mg
  Filled 2020-09-17 (×2): qty 1

## 2020-09-17 MED ORDER — CHLORHEXIDINE GLUCONATE CLOTH 2 % EX PADS
6.0000 | MEDICATED_PAD | Freq: Every day | CUTANEOUS | Status: DC
  Administered 2020-09-17 – 2020-09-25 (×9): 6 via TOPICAL

## 2020-09-17 MED ORDER — INSULIN ASPART 100 UNIT/ML ~~LOC~~ SOLN
0.0000 [IU] | SUBCUTANEOUS | Status: DC
  Administered 2020-09-18 – 2020-09-19 (×4): 1 [IU] via SUBCUTANEOUS

## 2020-09-17 MED ORDER — ADULT MULTIVITAMIN W/MINERALS CH
1.0000 | ORAL_TABLET | Freq: Every day | ORAL | Status: DC
  Administered 2020-09-17 – 2020-09-18 (×2): 1
  Filled 2020-09-17 (×2): qty 1

## 2020-09-17 MED ORDER — VITAL HIGH PROTEIN PO LIQD: 1000.0000 mL | ORAL | Status: DC

## 2020-09-17 MED ORDER — ORAL CARE MOUTH RINSE
15.0000 mL | OROMUCOSAL | Status: DC
  Administered 2020-09-17 – 2020-09-18 (×14): 15 mL via OROMUCOSAL

## 2020-09-17 MED ORDER — HYDRALAZINE HCL 20 MG/ML IJ SOLN
10.0000 mg | INTRAMUSCULAR | Status: DC | PRN
  Administered 2020-09-17 – 2020-09-19 (×2): 10 mg via INTRAVENOUS
  Filled 2020-09-17 (×4): qty 1

## 2020-09-17 MED ORDER — POTASSIUM CHLORIDE 10 MEQ/100ML IV SOLN
10.0000 meq | INTRAVENOUS | Status: AC
  Administered 2020-09-17 (×8): 10 meq via INTRAVENOUS
  Filled 2020-09-17 (×8): qty 100

## 2020-09-17 MED ORDER — VITAL AF 1.2 CAL PO LIQD
1000.0000 mL | ORAL | Status: DC
  Administered 2020-09-17 – 2020-09-18 (×2): 1000 mL

## 2020-09-17 MED ORDER — FENTANYL CITRATE (PF) 100 MCG/2ML IJ SOLN: 25.0000 ug | INTRAMUSCULAR | Status: DC | PRN

## 2020-09-17 MED ORDER — MAGNESIUM SULFATE 2 GM/50ML IV SOLN
2.0000 g | Freq: Once | INTRAVENOUS | Status: AC
  Administered 2020-09-17: 2 g via INTRAVENOUS
  Filled 2020-09-17: qty 50

## 2020-09-17 MED ORDER — PROSOURCE TF PO LIQD: 45.0000 mL | Freq: Two times a day (BID) | ORAL | Status: DC

## 2020-09-17 MED ORDER — POLYETHYLENE GLYCOL 3350 17 G PO PACK: 17.0000 g | PACK | Freq: Every day | ORAL | Status: DC | PRN

## 2020-09-17 MED ORDER — AMLODIPINE BESYLATE 5 MG PO TABS
5.0000 mg | ORAL_TABLET | Freq: Every day | ORAL | Status: DC
  Administered 2020-09-17 – 2020-09-18 (×2): 5 mg
  Filled 2020-09-17 (×2): qty 1

## 2020-09-17 MED ORDER — DOCUSATE SODIUM 50 MG/5ML PO LIQD: 100.0000 mg | Freq: Every day | ORAL | Status: DC | PRN

## 2020-09-17 MED ORDER — CHLORHEXIDINE GLUCONATE 0.12% ORAL RINSE (MEDLINE KIT)
15.0000 mL | Freq: Two times a day (BID) | OROMUCOSAL | Status: DC
  Administered 2020-09-17 – 2020-09-18 (×4): 15 mL via OROMUCOSAL

## 2020-09-17 NOTE — Progress Notes (Addendum)
NAME:  Andres White MRN:  [id number] DOB:  Jun 11, 1946 LOS: 1 ADMISSION DATE:  09/16/2020 DATE OF SERVICE:  09/16/2020 CHIEF COMPLAINT:  Status epilepticus   Brief History:  4 yoM from home with new onset seizure.  In SE requiring intubation for airway protection in ER.   History of Present Illness  This 75 y.o. African-American male presented to the [facility] Emergency Department via EMS with complaints of observed seizure activity at home.  Indeed, the patient had 2 additional seizure episodes while in the emergency department.  He has a known history of previous stroke; however, he has never been diagnosed with seizures.  He was intubated in the emergency department in a postictal state.  He has been loaded with Keppra and Dilantin.  Neurology is following.  Plans for EEG noted.  At the time of clinical interview, the patient is still under the effects of RSI medications for intubation.  Propofol was started after intubation but is currently on hold due to hypotension.  Per verbal report from ER team, the patient initially presented with lateral gaze preference and intermittent seizure-like activity.  He was treated with Ativan.  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   Past Medical/Surgical/Social/Family History   Past Medical History:  Diagnosis Date  . Arthritis   . Chronic back pain   . Gout   . Hypertension   . Stroke ([place]) 06/2020  . Syncope and collapse    Social History   Tobacco Use  . Smoking status: Current Every Day Smoker    Years: 20.00    Types: Cigarettes  . Smokeless tobacco: Never Used  Substance Use Topics  . Alcohol use: Not Currently    Comment: occasional    Family History  Problem Relation Age of Onset  . Hypertension Mother    Procedures:  2/13: Endotracheal intubation in ER  >>  Significant Diagnostic Tests:  2/13: CTA head/neck shows aortic atherosclerosis; calcified plaque in both carotid bifurcations (20% right proximal ICA stenosis, 70% left proximal ICA stenosis); 50% stenosis of the nondominant right vertebral artery, severe (~90%) stenosis of the left vertebral artery; advanced, chronic cervical spondylosis; hazy density in the right upper lobe (lung). Addendum: potentially be acute low-density in the left cerebellar peduncle that could indicate acute infarction in that region.  MR brain  Micro Data:  2/13 SARS >>neg 2/14 MRSA >> neg  Antimicrobials:  2/13 zosyn   Interim history/subjective:  Remains on cEEG Sedated on low dose propofol   Objective   BP 138/74   Pulse (!) 47   Temp (!) 97.3 F (36.3 C) (Axillary)   Resp 20   Ht 5\' 7"  (1.702 m)   Wt 82.7 kg   SpO2 100%   BMI 28.56 kg/m     Filed Weights   09/16/20 2104  Weight: 82.7 kg    Intake/Output Summary (Last 24 hours) at 09/17/2020 1145 Last data filed at 09/17/2020 1100 Gross per 24 hour  Intake 907.95 ml  Output 2650 ml  Net -1742.05 ml    Vent Mode: PRVC FiO2 (%):  [40 %-100 %] 40 % Set Rate:  [16 bmp-20 bmp] 16 bmp Vt Set:  [520 mL] 520 mL PEEP:  [5 cmH20] 5 cmH20 Plateau Pressure:  [14 cmH20-16 cmH20] 16 cmH20   Examination: General:  Critically ill elderly male sedated/ intubated on  MV HEENT: MM pink/moist, pupils 5/reactive Neuro: sedated, minimally withdrawals to noxious stimuli in extremities, some rhythmic muscle movements on right side of chest/ shoulder and sporadic movements of LLE CV: rr, SB PULM:  MV supported breaths, CTA, scant white/ thin secretions GI: soft, bs active, foley   Extremities: warm/dry, no LE edema  Skin: no rashes   Resolved Hospital Problem list    Assessment & Plan:   Status Epilepticus, new onset Hx prior CVA, r/o new process  -Primary management per neurology  -Respiratory support as below/ above -cEEG per Neurology   -Maintain neuro protective measures; goal for eurothermia, euglycemia, eunatermia, normoxia, and PCO2 goal of 35-40 -Nutrition and bowel regiment  -Seizure precautions  -AEDs per neurology - keppra 500mg  BID, dilantin 100mg  TID -Aspirations precautions  -MRI when able to come off cEEG, CTH showing old cerebellum and left thalamus infarctions; addendum noted: potentially be acute low-density in the left cerebellar peduncle that could indicate acute infarction in that region. 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  Patient will need MRI prior to extubation but remains on cEEG for now - CXR improved, hold on further abx, PCT reassuring < 0.10   Hypotension, now resolved, hypertensive at times Hx HTN - hold home metoprolol given bradycardia - apresoline prn SBP >170 - start norvasc 5 mg - continue ASA  Bradycardia  Prolonged QTc - EKG SB, prolonged QTC - tele monitoring  - bradycardic prior to propofol - K goal > 4, Mag > 2  AoCKD stage IIIa, s/p dye exposure w/ CTA head/ neck Hypokalemia - mag 1.9, s/p k replete, will give mag 2gm now - recheck K this afternoon - Trend BMP / urinary output - Replace electrolytes as indicated - Avoid nephrotoxic agents, ensure adequate renal perfusion    Anemia of chronic disease - H/H stable  - trend CBC, transfuse per protocol      Best practice:  Diet: Tube feeds Pain/Anxiety/Delirium  protocol (if indicated): Yes VAP protocol (if indicated): Yes DVT prophylaxis: Heparin SQ GI prophylaxis: Protonix Glucose control: CBG,> 200, adding SSI Mobility/Activity: Bedrest CODE STATUS: Full code   Code Status: Full Code Family Communication:  [name], patient's daughter, updated at bedside on plan of care 2/14. Patient lives with her.  Disposition: ICU   Labs   CBC: Recent Labs  Lab 09/16/20 2147 09/16/20 2157 09/16/20 2301 09/17/20 0049 09/17/20 0420 09/17/20 0431  WBC 9.4  --   --   --   --   --   NEUTROABS 4.1  --   --   --   --   --   HGB 11.2* 13.3 9.5* 11.6* 11.2* 10.9*  HCT 38.5* 39.0 28.0* 34.0* 33.0* 32.0*  MCV 98.2  --   --   --   --   --   PLT 236  --   --   --   --   --     Basic Metabolic Panel: Recent Labs  Lab 09/16/20 2147  09/16/20 2157 09/16/20 2301 09/17/20 0049 09/17/20 0420 09/17/20 0431 09/17/20 0556  NA 140 140 140 142 141 141 142  K 3.2* 3.2* 2.8* 2.6* 2.8* 2.7* 3.0*  CL 105 108  --   --   --   --  108  CO2 13*  --   --   --   --   --  22  GLUCOSE 126* 113*  --   --   --   --  121*  BUN 9 10  --   --   --   --  8  CREATININE 1.48* 1.30*  --   --   --   --  1.34*  CALCIUM 9.0  --   --   --   --   --  8.8*  MG  --   --   --   --   --   --  1.9   GFR: Estimated Creatinine Clearance: 47.5 mL/min (A) (by C-G formula based on SCr of 1.34 mg/dL (H)). Recent Labs  Lab 09/16/20 2147  PROCALCITON <0.10  WBC 9.4    Liver Function Tests: Recent Labs  Lab 09/16/20 2147  AST 31  ALT 14  ALKPHOS 78  BILITOT 0.7  PROT 7.1  ALBUMIN 3.1*   No results for input(s): LIPASE, AMYLASE in the last 168 hours. No results for input(s): AMMONIA in the last 168 hours.  ABG    Component Value Date/Time   PHART 7.459 (H) 09/17/2020 0431   PCO2ART 32.6 09/17/2020 0431   PO2ART 316 (H) 09/17/2020 0431   HCO3 23.3 09/17/2020 0431   TCO2 24 09/17/2020 0431   ACIDBASEDEF 4.0 (H) 09/17/2020 0420   O2SAT 100.0 09/17/2020 0431      Coagulation Profile: Recent Labs  Lab 09/16/20 2147  INR 1.3*    Cardiac Enzymes: No results for input(s): CKTOTAL, CKMB, CKMBINDEX, TROPONINI in the last 168 hours.  HbA1C: Hgb A1c MFr Bld  Date/Time Value Ref Range Status  06/25/2020 04:41 AM 4.8 4.8 - 5.6 % Final    Comment:    (NOTE) Pre diabetes:          5.7%-6.4%  Diabetes:              >6.4%  Glycemic control for   <7.0% adults with diabetes     CBG: Recent Labs  Lab 09/16/20 2050  GLUCAP 201*    Critical care time: 35 minutes.       Kennieth Rad, ACNP [facility] 09/17/2020, 11:46 AM

## 2020-09-17 NOTE — ED Notes (Signed)
Date and time results received: 09/17/20 0058   Test: potassium  Critical Value: 2.6  Name of Provider Notified: Clement Husbands RN

## 2020-09-17 NOTE — Procedures (Signed)
Patient Name: Andres White  MRN: [id number]  Epilepsy Attending: Lora Havens  Referring Physician/Provider: Dr. Amie Portland Duration: 09/16/2020 2327 to 09/17/2020 2327  Patient history: 75 year old male presented with seizure.  EEG to evaluate for seizures.  Level of alertness: asleep  AEDs during EEG study: LEV, PHT, Propofol  Technical aspects: This EEG study was done with scalp electrodes positioned according to the 10-20 International system of electrode placement. Electrical activity was acquired at a sampling rate of 500Hz  and reviewed with a high frequency filter of 70Hz  and a low frequency filter of 1Hz . EEG data were recorded continuously and digitally stored.   Description: No posterior dominant rhythm was seen. Sleep was characterized by vertex waves, sleep spindles (12 to 14 Hz), maximal frontocentral region.  EEG showed continuous generalized 3 to 6 Hz theta-delta slowing. Hyperventilation and photic stimulation were not performed.     Event button was pressed on 09/17/2020 at 1122 and 1559 for right upper extremity tremor which was difficult to visualize on camera.  Concomitant EEG before, during and after the event did not show any EEG changes suggest seizure.  ABNORMALITY -Continuous slow, generalized  IMPRESSION: This study is suggestive of moderate diffuse encephalopathy, nonspecific etiology.  Event button was pressed on 09/17/2020 at 1122 and1559 for right upper extremity tremor without concomitant EEG change.  Focal motor seizures may not be seen on scalp EEG, therefore clinical correlation is recommended.     Totiana Everson [name]

## 2020-09-17 NOTE — Progress Notes (Signed)
LTM maintenance completed- checked under A1 and A2, no skin breakdown was seen.

## 2020-09-17 NOTE — Progress Notes (Signed)
Initial Nutrition Assessment  DOCUMENTATION CODES:   Not applicable  INTERVENTION:   Initiate tube feeding via OG tube: Vital AF 1.2 at 65 ml/h (1560 ml per day)  Provides 1872 kcal, 117 gm protein, 1265 ml free water daily   NUTRITION DIAGNOSIS:   Inadequate oral intake related to inability to eat as evidenced by NPO status.  GOAL:   Patient will meet greater than or equal to 90% of their needs  MONITOR:   TF tolerance,Vent status  REASON FOR ASSESSMENT:   Consult,Ventilator Enteral/tube feeding initiation and management  ASSESSMENT:   Pt with PMH of CVA admitted from home after witnessed seizure   Pt discussed during ICU rounds and with RN.   Patient is currently intubated on ventilator support MV: 10 L/min Temp (24hrs), Avg:97.6 F (36.4 C), Min:97.3 F (36.3 C), Max:98.2 F (36.8 C)  Propofol: currently off due to hypotension  Medications reviewed and include: SSI, MVI with minerals Mag sulfate x 1   Labs reviewed: K+ 3   16 F OG tube: per xray enteric tube in place  NUTRITION - FOCUSED PHYSICAL EXAM:  Flowsheet Row Most Recent Value  Orbital Region No depletion  Upper Arm Region No depletion  Thoracic and Lumbar Region No depletion  Buccal Region No depletion  Temple Region No depletion  Clavicle Bone Region No depletion  Clavicle and Acromion Bone Region No depletion  Scapular Bone Region No depletion  Dorsal Hand No depletion  Patellar Region No depletion  Anterior Thigh Region No depletion  Posterior Calf Region No depletion  Edema (RD Assessment) None  Hair Reviewed  Eyes Unable to assess  Mouth Unable to assess  Skin Reviewed  Nails Reviewed       Diet Order:   Diet Order            Diet NPO time specified  Diet effective now                 EDUCATION NEEDS:   No education needs have been identified at this time  Skin:  Skin Assessment: Reviewed RN Assessment  Last BM:     Height:   Ht Readings from Last 1  Encounters:  09/16/20 5\' 7"  (1.702 m)    Weight:   Wt Readings from Last 1 Encounters:  09/16/20 82.7 kg    Ideal Body Weight:  67.2 kg  BMI:  Body mass index is 28.56 kg/m.  Estimated Nutritional Needs:   Kcal:  1900  Protein:  100-120 grams  Fluid:  > 1.9 L/day  Lockie Pares., RD, LDN, CNSC See AMiON for contact information

## 2020-09-17 NOTE — Progress Notes (Signed)
STAT LTM started in ED; advised nurse how to move pt if gets a room; no initial skin breakdown was seen.

## 2020-09-17 NOTE — Progress Notes (Signed)
eLink Physician-Brief Progress Note Patient Name: Andres White DOB: 10/23/1945 MRN: [id number]   Date of Service  09/17/2020  HPI/Events of Note  New admit just arrived to ICU. Seen by CCM in ED already. Low K, need replacements.  75 yr old admitted for status epilepticus, on Vent /propofol. S/p keppra.   Camera: Discussed with bed side RN. In synchrony with vent. P peak 18. PEEP 5. HR 45. Sinus brady. UOP good. Obese.   eICU Interventions  - ordering K replacement via PIV - on sq heparin as VTE - BG goals < 180. - continue care.      Intervention Category Major Interventions: Seizures - evaluation and management Intermediate Interventions: Electrolyte abnormality - evaluation and management Evaluation Type: New Patient Evaluation  [name] 09/17/2020, 1:42 AM

## 2020-09-17 NOTE — Progress Notes (Signed)
Transported patient from ED to [id number] without event.

## 2020-09-17 NOTE — Progress Notes (Signed)
[facility] Progress Note Patient Name: Andres White DOB: October 06, 1945 MRN: [id number]   Date of Service  09/17/2020  HPI/Events of Note  AM K+ 2.7 with no GFR calclulation, on vent  with NG and PIV's,  04:30 ABG in epic on PRVC 40/5/14/520      update currently receiving K+ runs  eICU Interventions  Getting K run replacement. Continue care.  Discussed with RN     Intervention Category Intermediate Interventions: Electrolyte abnormality - evaluation and management  [name] 09/17/2020, 4:58 AM

## 2020-09-17 NOTE — ED Notes (Signed)
Per Dr. Carson Myrtle - start propofol once pt tries to bite on tube/ pull tube.

## 2020-09-17 NOTE — Progress Notes (Signed)
.Neurology Consult Follow Up Note  Reason for Consult: Altered mental status, code stroke, seizures  Interval history: 2 witnessed seizures in the ED. S/p intubation. Loaded with Keppra and fosphenytoin. Stabilized and admitted to the  Neurologic ICU. Both hypotensive and hypertensive since admission with bradycardia and prolong Qt interval.   ROS: Unable to perform due to intubation and sedation.       Past Medical History:  Diagnosis Date  . Arthritis   . Chronic back pain   . Gout   . Hypertension   . Stroke ([place]) 06/2020  . Syncope and collapse          Family History  Problem Relation Age of Onset  . Hypertension Mother    Social History:   reports that he has been smoking cigarettes. He has smoked for the past 20.00 years. He has never used smokeless tobacco. He reports previous alcohol use. He reports that he does not use drugs.  Medications  Current Facility-Administered Medications:  .  fosPHENYtoin (CEREBYX) 1,500 mg PE in sodium chloride 0.9 % 50 mL IVPB, 1,500 mg PE, Intravenous, Once, Amie Portland, MD .  LORazepam (ATIVAN) 2 MG/ML injection, , , ,  .  LORazepam (ATIVAN) 2 MG/ML injection, , , ,  .  propofol (DIPRIVAN) 1000 MG/100ML infusion, , , ,  .  rocuronium (ZEMURON) injection, , , PRN, Cur[place]olo, Adam, DO, 80 mg at 09/16/20 2157  Current Outpatient Medications:  .  acetaminophen (TYLENOL) 325 MG tablet, Take 1-2 tablets (325-650 mg total) by mouth every 4 (four) hours as needed for mild pain., Disp: , Rfl:  .  aspirin EC 81 MG tablet, Take 1 tablet (81 mg total) by mouth daily., Disp: 90 tablet, Rfl: 3 .  atorvastatin (LIPITOR) 20 MG tablet, Take 40 mg by mouth daily., Disp: , Rfl:  .  citalopram (CELEXA) 20 MG tablet, Take 1 tablet (20 mg total) by mouth daily., Disp: 30 tablet, Rfl: 0 .  colchicine 0.6 MG tablet, Take 0.6 mg by mouth 2 (two) times daily as needed., Disp: , Rfl:  .  diclofenac Sodium (VOLTAREN) 1 % GEL, Apply 2 g topically 4  (four) times daily., Disp: 100 g, Rfl: 1 .  gabapentin (NEURONTIN) 100 MG capsule, Take 1 capsule (100 mg total) by mouth 2 (two) times daily. (Patient not taking: Reported on 09/14/2020), Disp: 60 capsule, Rfl: 0 .  hydrocerin (EUCERIN) CREA, Apply 1 application topically 2 (two) times daily., Disp: , Rfl: 0 .  metoprolol succinate (TOPROL-XL) 25 MG 24 hr tablet, Take 1 tablet (25 mg total) by mouth daily. Take with or immediately following a meal., Disp: 90 tablet, Rfl: 1 .  Multiple Vitamin (MULTIVITAMIN WITH MINERALS) TABS tablet, Take 1 tablet by mouth daily., Disp: , Rfl:  .  pantoprazole (PROTONIX) 40 MG tablet, Take 1 tablet (40 mg total) by mouth 2 (two) times daily., Disp: 60 tablet, Rfl: 1 .  tamsulosin (FLOMAX) 0.4 MG CAPS capsule, Take 1 capsule (0.4 mg total) by mouth daily., Disp: 30 capsule, Rfl: 0  Exam: Current vital signs: Vitals:   09/17/20 1400 09/17/20 1500 09/17/20 1520 09/17/20 1601  BP: (!) 151/76 128/68 128/68   Pulse:   (!) 52   Temp:    (!) 97.5 F (36.4 C)  Resp: 16 16 16    Height:      Weight:      SpO2:   98%   TempSrc:    Axillary  BMI (Calculated):  Vital signs in last 24 hours: General:HEENT: Normocephalic atraumatic.  Resp: Vented on  Extremities with 2+ pitting edema Neurological exam  Temp:  [97.3 F (36.3 C)-98.2 F (36.8 C)] 97.5 F (36.4 C) (02/14 1601) Pulse Rate:  [47-87] 52 (02/14 1520) Resp:  [12-33] 16 (02/14 1520) BP: (62-212)/(49-120) 128/68 (02/14 1520) SpO2:  [84 %-100 %] 98 % (02/14 1520) FiO2 (%):  [40 %-100 %] 40 % (02/14 1520) Weight:  [82.7 kg] 82.7 kg (02/13 2104)   Mental Status -  Critically ill elderly male intubated and sedated on propofol infusion Cranial Nerves II - XII - II - PERRL  III, IV, VI - Eyes midline without nystagmus.  V - Corneal reflexes intact.  VII - Unable to assess intubated sedated patient  VIII - Does not respond to name or clap.  X -+cough XI - Head midline XII - Unable to assess  d/t intubation  Motor Strength/Sensory - Currently restrained with mitten restraints bilat UE.  Withdraws to pain briskly in LUE bilat LE, delayed response in RUE  Coordination -  Tremor was present transiently RUE.  Gait and Station - deferred.  Labs I have reviewed labs in epic and the results pertinent to this consultation are:    UDS negative 2/13  CBC Labs (Brief)          Component Value Date/Time   WBC 9.4 09/16/2020 2147   RBC 3.92 (L) 09/16/2020 2147   HGB 13.3 09/16/2020 2157   HGB 12.6 (L) 03/23/2014 2009   HCT 39.0 09/16/2020 2157   HCT 39.7 (L) 03/23/2014 2009   PLT 236 09/16/2020 2147   PLT 176 03/23/2014 2009   MCV 98.2 09/16/2020 2147   MCV 95 03/23/2014 2009   MCH 28.6 09/16/2020 2147   MCHC 29.1 (L) 09/16/2020 2147   RDW 17.3 (H) 09/16/2020 2147   RDW 14.1 03/23/2014 2009   LYMPHSABS 4.6 (H) 09/16/2020 2147   LYMPHSABS 2.9 03/23/2014 2009   MONOABS 0.5 09/16/2020 2147   MONOABS 0.5 03/23/2014 2009   EOSABS 0.1 09/16/2020 2147   EOSABS 0.1 03/23/2014 2009   BASOSABS 0.0 09/16/2020 2147   BASOSABS 0.0 03/23/2014 2009      CMP     Labs (Brief)          Component Value Date/Time   NA 140 09/16/2020 2157   NA 134 (L) 03/23/2014 2009   K 3.2 (L) 09/16/2020 2157   K 3.5 03/23/2014 2009   CL 108 09/16/2020 2157   CL 106 03/23/2014 2009   CO2 24 07/16/2020 0511   CO2 17 (L) 03/23/2014 2009   GLUCOSE 113 (H) 09/16/2020 2157   GLUCOSE 116 (H) 03/23/2014 2009   BUN 10 09/16/2020 2157   BUN 36 (H) 03/23/2014 2009   CREATININE 1.30 (H) 09/16/2020 2157   CREATININE 1.88 (H) 03/23/2014 2009   CALCIUM 8.7 (L) 07/16/2020 0511   CALCIUM 8.9 03/23/2014 2009   PROT 7.0 06/28/2020 0407   PROT 8.2 01/06/2014 1157   ALBUMIN 3.3 (L) 06/28/2020 0407   ALBUMIN 3.5 01/06/2014 1157   AST 36 06/28/2020 0407   AST 51 (H) 01/06/2014 1157   ALT 30 06/28/2020 0407   ALT 43 01/06/2014 1157   ALKPHOS 65 06/28/2020  0407   ALKPHOS 76 01/06/2014 1157   BILITOT 1.2 06/28/2020 0407   BILITOT 0.4 01/06/2014 1157   GFRNONAA 49 (L) 07/16/2020 0511   GFRNONAA 35 (L) 03/23/2014 2009   GFRAA 32 (L) 11/18/2017 1830   GFRAA 41 (  L) 03/23/2014 2009     EEG 09/16/2020 IMPRESSION: This study is suggestive of moderate diffuse encephalopathy, nonspecific etiology. No seizures or epileptiform discharges were seen throughout the recording.  IMAGING: I have reviewed the images obtained:  CT-scan of the brain-initially read as no acute changes but there is a acute low-density in the left cerebellar peduncle that could indicate acute infarction in that region. There is some beam hardening artifact in that area hence not entirely sure of the significance of that hypodensity. Multiple old strokes  In both hemispheres including lacunes.  CTA Head + Neck:  Calcified plaque at both carotid 5 locations with 20% stenosis of the proximal ICA on the right, 70% stenosis of the proximal ICA on the left. 50% stenosis of the origin of the nondominant right vertebral artery. Extensive calcified plaque at the proximal dominant left vert with severe stenosis-nearly 90%. Both vessels are patent beyond the proximal stenosis to the basilar. No intracranial large or medium vessel occlusion or correctable proximal stenosis. Hazy density in the posterior right upper lobe could indicate bronchopneumonia.  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  Impression: 1. New onset seizure and status epilepticus 2. History of strokes 3. Possible acute low-density in the left cerebellar      peduncle infarct  4. S/p Intubation for airway protection, concern for      possible aspiration  Recommendations:  Loaded with Keppra and Dilantin  Continue Keppra 500mg  twice daily   Continue Dilantin 100mg  3 times daily  LTM read  shows no seizure activity, tremors seen on exam communicated to Dr. Hortense Ramal who reviewed LTM and did not see any EEG correlate to suggest seizure with noted tremor activity. Per her advice will check levels and consider adjusting doses.   Vent management per primary team  MRI brain PENDING when adequately stable and seizures/status is well controlled.  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  HGB A1C is PENDING  Lipid panel is PENDING, On lipitor 40mg  PTA  UDS negative.   Recent Echo 06/25/2020 showed EF 60-65%, no shunt   Recent long term monitoring (Dec 21 and Jan 22) without atrial fibrillation identified   On ASA 81 mg PTA, currently ordered  Neurology will continue to follow.    Attestation:  I saw this patient with the APP on 09/17/20, obtained pertinent aspects of the history, and performed relevant physical and neurological examination as documented. Also, I reviewed the available laboratory data and neuroimages, and other relevant tests/notes/procedures.  My examination findings include sedated, intubated, unable to participate in exam. See detailed documentation in APP note.  Impression: No indication on ongoing subclinical status on the EEG. Clinically definite seizures of new onset. Etiology unclear at this time. Prior stroke seems to be in a location unlikely to contribute to seizure tendencies, but perhaps other areas were affected.  Recommendations: Will check phenytoin level. Continue keppra. Will consider ongoing maintenance dose of fosphenytoin. Will continue to follow along.  Other findings and recommendations as documented.  Thank you.  Perfecto Kingdom, MD

## 2020-09-18 ENCOUNTER — Other Ambulatory Visit: Payer: Medicare Other

## 2020-09-18 ENCOUNTER — Inpatient Hospital Stay: Admission: RE | Admit: 2020-09-18 | Payer: Medicare Other | Source: Ambulatory Visit

## 2020-09-18 DIAGNOSIS — N183 Chronic kidney disease, stage 3 unspecified: Secondary | ICD-10-CM | POA: Diagnosis not present

## 2020-09-18 DIAGNOSIS — G40901 Epilepsy, unspecified, not intractable, with status epilepticus: Secondary | ICD-10-CM | POA: Diagnosis not present

## 2020-09-18 LAB — LIPID PANEL
Cholesterol: 79 mg/dL (ref 0–200)
HDL: 44 mg/dL (ref >40)
LDL Cholesterol: 19 mg/dL (ref 0–99)
Total CHOL/HDL Ratio: 1.8 RATIO
Triglycerides: 81 mg/dL (ref <150)
VLDL: 16 mg/dL (ref 0–40)

## 2020-09-18 LAB — CBC
HCT: 35.6 % — ABNORMAL LOW (ref 39.0–52.0)
Hemoglobin: 11 g/dL — ABNORMAL LOW (ref 13.0–17.0)
MCH: 28 pg (ref 26.0–34.0)
MCHC: 30.9 g/dL (ref 30.0–36.0)
MCV: 90.6 fL (ref 80.0–100.0)
Platelets: 220 10*3/uL (ref 150–400)
RBC: 3.93 MIL/uL — ABNORMAL LOW (ref 4.22–5.81)
RDW: 17.6 % — ABNORMAL HIGH (ref 11.5–15.5)
WBC: 9.7 10*3/uL (ref 4.0–10.5)
nRBC: 0 % (ref 0.0–0.2)

## 2020-09-18 LAB — BASIC METABOLIC PANEL
Anion gap: 12 (ref 5–15)
Anion gap: 11 (ref 5–15)
BUN: 8 mg/dL (ref 8–23)
BUN: 9 mg/dL (ref 8–23)
CO2: 21 mmol/L — ABNORMAL LOW (ref 22–32)
CO2: 21 mmol/L — ABNORMAL LOW (ref 22–32)
Calcium: 8.6 mg/dL — ABNORMAL LOW (ref 8.9–10.3)
Calcium: 8.7 mg/dL — ABNORMAL LOW (ref 8.9–10.3)
Chloride: 105 mmol/L (ref 98–111)
Chloride: 107 mmol/L (ref 98–111)
Creatinine, Ser: 1.33 mg/dL — ABNORMAL HIGH (ref 0.61–1.24)
Creatinine, Ser: 1.14 mg/dL (ref 0.61–1.24)
GFR, Estimated: 55 mL/min — ABNORMAL LOW (ref >60)
GFR, Estimated: >60 mL/min (ref >60)
Glucose, Bld: 124 mg/dL — ABNORMAL HIGH (ref 70–99)
Glucose, Bld: 105 mg/dL — ABNORMAL HIGH (ref 70–99)
Potassium: 3.3 mmol/L — ABNORMAL LOW (ref 3.5–5.1)
Potassium: 3.6 mmol/L (ref 3.5–5.1)
Sodium: 138 mmol/L (ref 135–145)
Sodium: 139 mmol/L (ref 135–145)

## 2020-09-18 LAB — PHOSPHORUS
Phosphorus: 3.4 mg/dL (ref 2.5–4.6)
Phosphorus: 2.7 mg/dL (ref 2.5–4.6)

## 2020-09-18 LAB — PROCALCITONIN: Procalcitonin: <0.1 ng/mL

## 2020-09-18 LAB — MAGNESIUM
Magnesium: 2.2 mg/dL (ref 1.7–2.4)
Magnesium: 2.1 mg/dL (ref 1.7–2.4)

## 2020-09-18 LAB — GLUCOSE, CAPILLARY
Glucose-Capillary: 136 mg/dL — ABNORMAL HIGH (ref 70–99)
Glucose-Capillary: 147 mg/dL — ABNORMAL HIGH (ref 70–99)
Glucose-Capillary: 105 mg/dL — ABNORMAL HIGH (ref 70–99)
Glucose-Capillary: 118 mg/dL — ABNORMAL HIGH (ref 70–99)
Glucose-Capillary: 115 mg/dL — ABNORMAL HIGH (ref 70–99)
Glucose-Capillary: 101 mg/dL — ABNORMAL HIGH (ref 70–99)

## 2020-09-18 LAB — HEMOGLOBIN A1C
Hgb A1c MFr Bld: 4.5 % — ABNORMAL LOW (ref 4.8–5.6)
Mean Plasma Glucose: 82 mg/dL

## 2020-09-18 MED ORDER — POTASSIUM CHLORIDE 20 MEQ PO PACK
20.0000 meq | PACK | ORAL | Status: AC
  Administered 2020-09-18 (×2): 20 meq
  Filled 2020-09-18 (×2): qty 1

## 2020-09-18 MED ORDER — DEXTROSE-NACL 5-0.45 % IV SOLN: INTRAVENOUS | Status: DC

## 2020-09-18 MED ORDER — POLYETHYLENE GLYCOL 3350 17 G PO PACK
17.0000 g | PACK | Freq: Every day | ORAL | Status: DC | PRN
  Administered 2020-09-23 – 2020-09-24 (×2): 17 g via ORAL
  Filled 2020-09-18 (×2): qty 1

## 2020-09-18 MED ORDER — FENTANYL CITRATE (PF) 100 MCG/2ML IJ SOLN: 25.0000 ug | INTRAMUSCULAR | Status: DC | PRN

## 2020-09-18 MED ORDER — POTASSIUM CHLORIDE 10 MEQ/100ML IV SOLN
10.0000 meq | INTRAVENOUS | Status: AC
  Administered 2020-09-18 (×4): 10 meq via INTRAVENOUS
  Filled 2020-09-18 (×4): qty 100

## 2020-09-18 MED ORDER — ASPIRIN 81 MG PO CHEW
81.0000 mg | CHEWABLE_TABLET | Freq: Every day | ORAL | Status: DC
  Administered 2020-09-19 – 2020-09-25 (×7): 81 mg via ORAL
  Filled 2020-09-18 (×7): qty 1

## 2020-09-18 MED ORDER — AMLODIPINE BESYLATE 5 MG PO TABS
5.0000 mg | ORAL_TABLET | Freq: Every day | ORAL | Status: DC
  Administered 2020-09-19 – 2020-09-25 (×7): 5 mg via ORAL
  Filled 2020-09-18 (×7): qty 1

## 2020-09-18 MED ORDER — DOCUSATE SODIUM 50 MG/5ML PO LIQD
100.0000 mg | Freq: Every day | ORAL | Status: DC | PRN
  Administered 2020-09-22: 100 mg via ORAL
  Filled 2020-09-18: qty 10

## 2020-09-18 MED ORDER — ADULT MULTIVITAMIN W/MINERALS CH
1.0000 | ORAL_TABLET | Freq: Every day | ORAL | Status: DC
  Administered 2020-09-19 – 2020-09-25 (×7): 1 via ORAL
  Filled 2020-09-18 (×7): qty 1

## 2020-09-18 MED ORDER — ORAL CARE MOUTH RINSE
15.0000 mL | Freq: Two times a day (BID) | OROMUCOSAL | Status: DC
  Administered 2020-09-18: 15 mL via OROMUCOSAL

## 2020-09-18 MED ORDER — ALBUTEROL SULFATE (2.5 MG/3ML) 0.083% IN NEBU: 2.5000 mg | INHALATION_SOLUTION | RESPIRATORY_TRACT | Status: DC | PRN

## 2020-09-18 NOTE — Progress Notes (Signed)
Brief Progress Note Received a call that patient has self extubated. He is currently on 2L with sat of 98%. Awake and following commands. No increased WOB Propofol is off , and has been removed from the med profile.  Fentanyl 25 mcg prn for pain as needed Q4 Will continue to monitor.   Magdalen Spatz, MSN, AGACNP-BC [place] for personal pager 09/18/2020 2:25 PM

## 2020-09-18 NOTE — Procedures (Signed)
Patient Name: Andres White  MRN: [id number]  Epilepsy Attending: Lora Havens  Referring Physician/Provider: Dr. Amie Portland Duration: 09/17/2020 2327 to 09/18/2020 1030  Patient history: 75 year old male presented with seizure.  EEG to evaluate for seizures.  Level of alertness: awake, asleep  AEDs during EEG study: LEV, PHT, Propofol  Technical aspects: This EEG study was done with scalp electrodes positioned according to the 10-20 International system of electrode placement. Electrical activity was acquired at a sampling rate of 500Hz  and reviewed with a high frequency filter of 70Hz  and a low frequency filter of 1Hz . EEG data were recorded continuously and digitally stored.   Description: No posterior dominant rhythm was seen. Sleep was characterized by vertex waves, sleep spindles (12 to 14 Hz), maximal frontocentral region.  EEG showed continuous generalized 3 to 6 Hz theta-delta slowing. Hyperventilation and photic stimulation were not performed.     ABNORMALITY -Continuous slow, generalized  IMPRESSION: This study is suggestive of moderate diffuse encephalopathy, nonspecific etiology.  No definite seizures or epileptiform discharges were seen during the study.  [name] [name]

## 2020-09-18 NOTE — Progress Notes (Addendum)
NAME:  Andres White MRN:  [id number] DOB:  August 19, 1945 LOS: 2 ADMISSION DATE:  09/16/2020 DATE OF SERVICE:  09/16/2020 CHIEF COMPLAINT:  Status epilepticus   Brief History:  13 yoM from home with new onset seizure.  In SE requiring intubation for airway protection in ER.   History of Present Illness  This 75 y.o. African-American male presented to the [facility] Emergency Department via EMS with complaints of observed seizure activity at home.  Indeed, the patient had 2 additional seizure episodes while in the emergency department.  He has a known history of previous stroke; however, he has never been diagnosed with seizures.  He was intubated in the emergency department in a postictal state.  He has been loaded with Keppra and Dilantin.  Neurology is following.  Plans for EEG noted.  At the time of clinical interview, the patient is still under the effects of RSI medications for intubation.  Propofol was started after intubation but is currently on hold due to hypotension.  Per verbal report from ER team, the patient initially presented with lateral gaze preference and intermittent seizure-like activity.  He was treated with Ativan.  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   Past Medical/Surgical/Social/Family History   Past Medical History:  Diagnosis Date  . Arthritis   . Chronic back pain   . Gout   . Hypertension   . Stroke ([place]) 06/2020  . Syncope and collapse    Social History   Tobacco Use  . Smoking status: Current Every Day Smoker    Years: 20.00    Types: Cigarettes  . Smokeless tobacco: Never Used  Substance Use Topics  . Alcohol use: Not Currently    Comment: occasional    Family History  Problem Relation Age of Onset  . Hypertension Mother    Procedures:  2/13: Endotracheal intubation in ER  >>  Significant Diagnostic Tests:  2/13: CTA head/neck shows aortic atherosclerosis; calcified plaque in both carotid bifurcations (20% right proximal ICA stenosis, 70% left proximal ICA stenosis); 50% stenosis of the nondominant right vertebral artery, severe (~90%) stenosis of the left vertebral artery; advanced, chronic cervical spondylosis; hazy density in the right upper lobe (lung). Addendum: potentially be acute low-density in the left cerebellar peduncle that could indicate acute infarction in that region.  Micro Data:  2/13 SARS >>neg 2/14 MRSA >> neg  Antimicrobials:  2/13 zosyn   Interim history/subjective:  Remains on cEEG>> MRI pending Sedated on low dose propofol at 15 mcg/kg/min Net negative 1797 K 3.3, Mag 2.2, Creatinine 1.33 T max 99, WBC 9.7 1450 UO last 24 hours No obvious seizure activity per nursing. No documentation of cEEG yet this am.  Tolerating tube feeds Objective   BP 136/78   Pulse 68   Temp 98.4 F (36.9 C) (Axillary)   Resp 15   Ht 5\' 7"  (1.702 m)   Wt 82.7 kg   SpO2 99%   BMI 28.56 kg/m     Filed Weights   09/16/20 2104  Weight: 82.7 kg    Intake/Output Summary (Last 24 hours) at 09/18/2020 0814 Last data filed at 09/18/2020 0600 Gross per 24 hour  Intake 2032.43 ml  Output 1450 ml  Net 582.43 ml    Vent Mode: PRVC FiO2 (%):  [40 %] 40 % Set Rate:  [6 bmp-16 bmp] 6 bmp  Vt Set:  [520 mL] 520 mL PEEP:  [5 cmH20] 5 cmH20 Plateau Pressure:  [15 KDX83-38 cmH20] 15 cmH20   Examination: General:  Critically ill elderly male sedated/ intubated on MV, cEEG HEENT: MM pink/dry, pupils 2-3/reactive Neuro: sedated, following simple commands, Following commands with L hand, not right, nodding head to simple questions, no seizure activity noted on my exam CV: rr, SR PULM:  MV supported breaths, CTA, scant white/ thin secretions GI: soft, bs active, foley with clear amber urine   Extremities: warm/dry, no LE edema  Skin: no rashes , lesions  or breakdown noted  Resolved Hospital Problem list    Assessment & Plan:   Status Epilepticus, new onset Hx prior CVA, r/o new process  -Primary management per neurology  -Respiratory support as below/ above -cEEG per Neurology  -Maintain neuro protective measures; goal for eurothermia, euglycemia, eunatermia, normoxia, and PCO2 goal of 35-40 -Nutrition and bowel regiment  -Seizure precautions  -AEDs per neurology - keppra 500mg  BID, dilantin 100mg  TID -Aspirations precautions  -MRI when able to come off cEEG, CTH showing old cerebellum and left thalamus infarctions; addendum noted: potentially be acute low-density in the left cerebellar peduncle that could indicate acute infarction in that region. 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  Patient will need MRI prior to extubation but remains on cEEG for now - CXR improved, hold on further abx, PCT reassuring < 0.10   Hypotension, now resolved, hypertensive at times Hx HTN - hold home metoprolol given bradycardia - apresoline prn SBP >170 - start norvasc 5 mg - continue ASA  Bradycardia>> SR 2/15 Prolonged QTc - EKG SB, prolonged QTC - tele monitoring  - bradycardic prior to propofol - K goal > 4, Mag > 2 - EKG in am and prn  AoCKD stage IIIa, s/p dye exposure w/ CTA head/ neck Hypokalemia>> repletion again 2/15 - mag 2.2, after repletion 2/14 - recheck K this afternoon 2/15 -  Trend BMP / urinary output - Replace electrolytes as indicated - Avoid nephrotoxic agents, ensure adequate renal perfusion    Anemia of chronic disease - H/H stable  - trend CBC, transfuse per protocol      Best practice:  Diet: Tube feeds Pain/Anxiety/Delirium protocol (if indicated): Yes VAP protocol (if indicated): Yes DVT prophylaxis: Heparin SQ GI prophylaxis: Protonix Glucose control: CBG,> 200, adding SSI Mobility/Activity: Bedrest CODE STATUS: Full code   Code Status: Full Code Family Communication:  [name], patient's daughter, updated at bedside on plan of care 2/14. Patient lives with her.  Disposition: ICU   Labs   CBC: Recent Labs  Lab 09/16/20 2147 09/16/20 2157 09/17/20 0049 09/17/20 0420 09/17/20 0431 09/17/20 1107 09/18/20 0616  WBC 9.4  --   --   --   --  7.8 9.7  NEUTROABS 4.1  --   --   --   --   --   --   HGB 11.2*   < > 11.6* 11.2* 10.9* 11.1* 11.0*  HCT 38.5*   < > 34.0* 33.0* 32.0* 36.9* 35.6*  MCV 98.2  --   --   --   --  92.3 90.6  PLT 236  --   --   --   --  221 220   < > = values in this interval not displayed.    Basic Metabolic Panel: Recent Labs  Lab 09/16/20 2147 09/16/20 2157 09/16/20 2301 09/17/20 0049 09/17/20 0420 09/17/20 0431 09/17/20 0556 09/17/20 1107 09/17/20 1914 09/18/20 0616  NA 140 140   < > 142 141 141 142  --   --  138  K 3.2* 3.2*   < > 2.6* 2.8* 2.7* 3.0* 3.0*  --  3.3*  CL 105 108  --   --   --   --  108  --   --  105  CO2 13*  --   --   --   --   --  22  --   --  21*  GLUCOSE 126* 113*  --   --   --   --  121*  --   --  124*  BUN 9 10  --   --   --   --  8  --   --  8  CREATININE 1.48* 1.30*  --   --   --   --  1.34*  --   --  1.33*  CALCIUM 9.0  --   --   --   --   --  8.8*  --   --  8.6*  MG  --   --   --   --   --   --  1.9 1.9 2.0 2.2  PHOS  --   --   --   --   --   --   --  2.7 3.3 3.4   < > = values in this interval not displayed.   GFR: Estimated Creatinine Clearance: 47.8 mL/min (A) (by  C-G formula based on SCr of 1.33 mg/dL (H)). Recent Labs  Lab 09/16/20 2147 09/17/20 1107 09/18/20 0616  PROCALCITON <0.10  --   --   WBC 9.4 7.8 9.7    Liver Function Tests: Recent Labs  Lab 09/16/20 2147  AST 31  ALT 14  ALKPHOS 78  BILITOT 0.7  PROT 7.1  ALBUMIN 3.1*   No results for input(s): LIPASE, AMYLASE in the last 168 hours. No results for input(s): AMMONIA in the last 168 hours.  ABG    Component Value Date/Time   PHART 7.459 (H) 09/17/2020 0431   PCO2ART 32.6 09/17/2020 0431   PO2ART 316 (H) 09/17/2020 0431   HCO3 23.3 09/17/2020 0431   TCO2 24 09/17/2020 0431   ACIDBASEDEF 4.0 (H) 09/17/2020 0420   O2SAT 100.0 09/17/2020 0431     Coagulation Profile: Recent Labs  Lab 09/16/20 2147  INR 1.3*    Cardiac Enzymes: No results for input(s): CKTOTAL, CKMB, CKMBINDEX, TROPONINI in the last 168 hours.  HbA1C: Hgb A1c MFr Bld  Date/Time Value Ref Range Status  09/17/2020 11:07 AM 4.5 (L) 4.8 - 5.6 % Final    Comment:    (NOTE)         Prediabetes: 5.7 - 6.4         Diabetes: >6.4         Glycemic control for adults with diabetes: <7.0   06/25/2020 04:41 AM 4.8 4.8 - 5.6 % Final    Comment:    (  NOTE) Pre diabetes:          5.7%-6.4%  Diabetes:              >6.4%  Glycemic control for   <7.0% adults with diabetes     CBG: Recent Labs  Lab 09/17/20 1527 09/17/20 1924 09/17/20 2315 09/18/20 0312 09/18/20 0736  GLUCAP 117* 116* 136* 136* 147*    Critical care time: 35 minutes.       Magdalen Spatz, MSN, AGACNP-BC [place] for personal pager PCCM on call pager [phone] 09/18/2020, 8:14 AM

## 2020-09-18 NOTE — Progress Notes (Signed)
LTM maintenance completed; checked skin under Fp1, Fp2, and A1; no skin breakdown was seen. Also changed EKG position.

## 2020-09-18 NOTE — Progress Notes (Signed)
RT called to bedside by RN because patient self-extuabted. Upon arrival RN x3 bedside and patient sating 98% on 4L Kay. Patient speaking and no stridor noted at this time. No respiratory distress noted. RT will continue to monitor as needed.

## 2020-09-19 ENCOUNTER — Inpatient Hospital Stay (HOSPITAL_COMMUNITY): Payer: Medicare Other

## 2020-09-19 DIAGNOSIS — G40901 Epilepsy, unspecified, not intractable, with status epilepticus: Secondary | ICD-10-CM | POA: Diagnosis not present

## 2020-09-19 LAB — CBC
HCT: 32.7 % — ABNORMAL LOW (ref 39.0–52.0)
Hemoglobin: 10.1 g/dL — ABNORMAL LOW (ref 13.0–17.0)
MCH: 27.7 pg (ref 26.0–34.0)
MCHC: 30.9 g/dL (ref 30.0–36.0)
MCV: 89.6 fL (ref 80.0–100.0)
Platelets: 166 10*3/uL (ref 150–400)
RBC: 3.65 MIL/uL — ABNORMAL LOW (ref 4.22–5.81)
RDW: 17.2 % — ABNORMAL HIGH (ref 11.5–15.5)
WBC: 11.4 10*3/uL — ABNORMAL HIGH (ref 4.0–10.5)
nRBC: 0 % (ref 0.0–0.2)

## 2020-09-19 LAB — COMPREHENSIVE METABOLIC PANEL
ALT: 14 U/L (ref 0–44)
AST: 34 U/L (ref 15–41)
Albumin: 2.9 g/dL — ABNORMAL LOW (ref 3.5–5.0)
Alkaline Phosphatase: 70 U/L (ref 38–126)
Anion gap: 12 (ref 5–15)
BUN: 7 mg/dL — ABNORMAL LOW (ref 8–23)
CO2: 20 mmol/L — ABNORMAL LOW (ref 22–32)
Calcium: 8.9 mg/dL (ref 8.9–10.3)
Chloride: 107 mmol/L (ref 98–111)
Creatinine, Ser: 0.96 mg/dL (ref 0.61–1.24)
GFR, Estimated: >60 mL/min (ref >60)
Glucose, Bld: 91 mg/dL (ref 70–99)
Potassium: 3.7 mmol/L (ref 3.5–5.1)
Sodium: 139 mmol/L (ref 135–145)
Total Bilirubin: 0.4 mg/dL (ref 0.3–1.2)
Total Protein: 7.1 g/dL (ref 6.5–8.1)

## 2020-09-19 LAB — GLUCOSE, CAPILLARY
Glucose-Capillary: 94 mg/dL (ref 70–99)
Glucose-Capillary: 86 mg/dL (ref 70–99)
Glucose-Capillary: 90 mg/dL (ref 70–99)
Glucose-Capillary: 125 mg/dL — ABNORMAL HIGH (ref 70–99)
Glucose-Capillary: 97 mg/dL (ref 70–99)
Glucose-Capillary: 102 mg/dL — ABNORMAL HIGH (ref 70–99)

## 2020-09-19 LAB — MAGNESIUM
Magnesium: UNDETERMINED mg/dL (ref 1.7–2.4)
Magnesium: 1.8 mg/dL (ref 1.7–2.4)

## 2020-09-19 MED ORDER — CITALOPRAM HYDROBROMIDE 10 MG PO TABS
20.0000 mg | ORAL_TABLET | Freq: Every day | ORAL | Status: DC
  Administered 2020-09-19 – 2020-09-25 (×7): 20 mg via ORAL
  Filled 2020-09-19 (×7): qty 2

## 2020-09-19 MED ORDER — LORAZEPAM 2 MG/ML IJ SOLN
1.0000 mg | INTRAMUSCULAR | Status: DC | PRN
  Administered 2020-09-19 – 2020-09-20 (×3): 1 mg via INTRAVENOUS
  Filled 2020-09-19 (×3): qty 1

## 2020-09-19 MED ORDER — HYDRALAZINE HCL 20 MG/ML IJ SOLN
10.0000 mg | Freq: Once | INTRAMUSCULAR | Status: AC
  Administered 2020-09-19: 10 mg via INTRAVENOUS

## 2020-09-19 MED ORDER — PANTOPRAZOLE SODIUM 40 MG PO TBEC
40.0000 mg | DELAYED_RELEASE_TABLET | Freq: Every day | ORAL | Status: DC
  Administered 2020-09-19 – 2020-09-25 (×7): 40 mg via ORAL
  Filled 2020-09-19 (×7): qty 1

## 2020-09-19 MED ORDER — RESOURCE THICKENUP CLEAR PO POWD
ORAL | Status: DC | PRN
  Filled 2020-09-19: qty 125

## 2020-09-19 NOTE — Progress Notes (Signed)
Nutrition Follow-up  DOCUMENTATION CODES:   Not applicable  INTERVENTION:   Magic cup TID with meals, each supplement provides 290 kcal and 9 grams of protein  Encourage PO intake    NUTRITION DIAGNOSIS:   Inadequate oral intake related to lethargy/confusion,dysphagia as evidenced by per patient/family report. Ongoing.   GOAL:   Patient will meet greater than or equal to 90% of their needs Progressing.   MONITOR:   PO intake,Supplement acceptance  REASON FOR ASSESSMENT:   Consult,Ventilator Enteral/tube feeding initiation and management  ASSESSMENT:   Pt with PMH of CVA admitted from home after witnessed seizure   Pt discussed during ICU rounds and with RN.  Pt passed swallow evaluation and started on Dysphagia 3 diet with nectar thickened liquids Pt awake and alert but confused.   2/15 self extubated  Medications reviewed and include: SSI, MVI with minerals D5 1/2 NS @ 50 ml/hr Keppra   Labs reviewed     Diet Order:   Diet Order            DIET DYS 3 Room service appropriate? Yes; Fluid consistency: Nectar Thick  Diet effective now                 EDUCATION NEEDS:   No education needs have been identified at this time  Skin:  Skin Assessment: Reviewed RN Assessment  Last BM:  2/15 x 3 large; type 7  Height:   Ht Readings from Last 1 Encounters:  09/16/20 5\' 7"  (1.702 m)    Weight:   Wt Readings from Last 1 Encounters:  09/16/20 82.7 kg    Ideal Body Weight:  67.2 kg  BMI:  Body mass index is 28.56 kg/m.  Estimated Nutritional Needs:   Kcal:  1900-2100  Protein:  100-120 grams  Fluid:  > 1.9 L/day  Lockie Pares., RD, LDN, CNSC See AMiON for contact information

## 2020-09-19 NOTE — NC FL2 (Signed)
[place] LEVEL OF CARE SCREENING TOOL     IDENTIFICATION  Patient Name: Andres White Birthdate: [birth year] Sex: male Admission Date (Current Location): 09/16/2020  [facility] and [place] Number:  Herbalist and Address:  [facility], [place] [address], [place], [place] [address]      Provider Number: [id number]  Attending Physician Name and Address:  Renee Pain, MD  Relative Name and Phone Number:  [name] (daughter) [phone]    Current Level of Care: Hospital Recommended Level of Care: [place] Prior Approval Number:    Date Approved/Denied:   PASRR Number: [id number]  Discharge Plan: SNF    Current Diagnoses: Patient Active Problem List   Diagnosis Date Noted  . Endotracheally intubated 09/16/2020  . Community acquired pneumonia 09/16/2020  . Cervical spondylosis 09/16/2020  . 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 Status epilepticus ([place]) 09/16/2020  . CKD (chronic kidney disease) stage 3, GFR 30-59 ml/min (HCC) 09/16/2020  . Occult blood in stools   . Leukocytosis   . Hyponatremia   . History of hypertension   . Dysphagia, post-stroke   . Dysesthesia   . Neuropathic pain   . Brainstem infarct, acute ([place])   . Acute blood loss anemia   . AKI (acute kidney injury) ([place])   . Stroke (cerebrum) ([place]) 06/27/2020  . Right leg weakness   . Left carotid stenosis 04/13/2014    Orientation RESPIRATION BLADDER Height & Weight     Self  Normal Continent,External catheter Weight: 182 lb 5.1 oz (82.7 kg) Height:  5\' 7"  (170.2 cm)  BEHAVIORAL SYMPTOMS/MOOD NEUROLOGICAL BOWEL NUTRITION STATUS    Convulsions/Seizures Incontinent Diet (See DC summary)  AMBULATORY STATUS COMMUNICATION OF NEEDS Skin   Extensive Assist Verbally Normal                       Personal Care Assistance Level of Assistance  Bathing,Feeding,Dressing Bathing Assistance: Maximum assistance Feeding assistance:  Independent Dressing Assistance: Maximum assistance     Functional Limitations Info  Sight,Hearing,Speech Sight Info: Adequate Hearing Info: Adequate Speech Info: Adequate    SPECIAL CARE FACTORS FREQUENCY  PT (By licensed PT),OT (By licensed OT)     PT Frequency: 5X per week OT Frequency: 5X per week            Contractures Contractures Info: Not present    Additional Factors Info  Code Status,Allergies,Insulin Sliding Scale Code Status Info: FULL Allergies Info: NKA   Insulin Sliding Scale Info: See DC summary       Current Medications (09/19/2020):  This is the current hospital active medication list Current Facility-Administered Medications  Medication Dose Route Frequency Provider Last Rate Last Admin  . albuterol (PROVENTIL) (2.5 MG/3ML) 0.083% nebulizer solution 2.5 mg  2.5 mg Nebulization Q4H PRN Renee Pain, MD      . amLODipine (NORVASC) tablet 5 mg  5 mg Oral Daily Magdalen Spatz, NP   5 mg at 09/19/20 1052  . aspirin chewable tablet 81 mg  81 mg Oral Daily Magdalen Spatz, NP   81 mg at 09/19/20 1052  . Chlorhexidine Gluconate Cloth 2 % PADS 6 each  6 each Topical Daily Renee Pain, MD   6 each at 09/19/20 1614  . citalopram (CELEXA) tablet 20 mg  20 mg Oral Daily Corey Harold, NP   20 mg at 09/19/20 1056  . dextrose 5 %-0.45 % sodium chloride infusion   Intravenous Continuous Groce,  Lars Masson, NP 50 mL/hr at 09/19/20 1600 Infusion Verify at 09/19/20 1600  . docusate (COLACE) 50 MG/5ML liquid 100 mg  100 mg Oral Daily PRN Magdalen Spatz, NP      . heparin injection 5,000 Units  5,000 Units Subcutaneous Q8H Renee Pain, MD   5,000 Units at 09/19/20 1341  . hydrALAZINE (APRESOLINE) injection 10 mg  10 mg Intravenous Q4H PRN Jennelle Human B, NP   10 mg at 09/19/20 0751  . insulin aspart (novoLOG) injection 0-9 Units  0-9 Units Subcutaneous Q4H Jennelle Human B, NP   1 Units at 09/19/20 1616  . levETIRAcetam (KEPPRA) IVPB 500 mg/100 mL premix  500  mg Intravenous Q12H Amie Portland, MD   Stopped at 09/19/20 1006  . multivitamin with minerals tablet 1 tablet  1 tablet Oral Daily Magdalen Spatz, NP   1 tablet at 09/19/20 1052  . pantoprazole (PROTONIX) EC tablet 40 mg  40 mg Oral Daily Renee Pain, MD   40 mg at 09/19/20 1056  . phenytoin (DILANTIN) injection 100 mg  100 mg Intravenous Q8H Amie Portland, MD   100 mg at 09/19/20 1341  . polyethylene glycol (MIRALAX / GLYCOLAX) packet 17 g  17 g Oral Daily PRN Magdalen Spatz, NP      . Resource ThickenUp Clear   Oral PRN Renee Pain, MD      . rocuronium Alfredia Client) injection    PRN Lennice Sites, DO   80 mg at 09/16/20 2157  . tamsulosin (FLOMAX) capsule 0.4 mg  0.4 mg Oral Daily Renee Pain, MD   0.4 mg at 09/19/20 1052     Discharge Medications: Please see discharge summary for a list of discharge medications.  Relevant Imaging Results:  Relevant Lab Results:   Additional Information SSN: [id number]. Moderna COVID-19 Vaccine 01/08/2020 , 12/11/2019  Benard Halsted, LCSW

## 2020-09-19 NOTE — Evaluation (Signed)
Physical Therapy Evaluation Patient Details Name: Andres White MRN: [id number] DOB: 06-07-1946 Today's Date: 09/19/2020   History of Present Illness  Pt is a 75 y.o. male with medical history significant of hypertension, arthritis, tobacco use x20 years, gout, previous CVAs (left midbrain, left frontal and parietal lobes, right parietotemporal lobe, bilateral thalamus, and bilateral cerebellum) presents to ED on 2/13 with seizures with L and upwards gaze deviation. x2 more seizures in ED. CTA head and neck shows calcified plaque at both carotid 5 locations with 20% stenosis of the proximal ICA on the right, 70% stenosis of the proximal ICA on the left. 50% stenosis of the origin of the nondominant right vertebral artery. CT initially shows no acute changes, repeat CT shows acute low-density area in L cerebellar peduncle which could indicate acute infarct. EEG shows moderate diffuse encephalopathy, ETT 2/13-2/15.  Clinical Impression   Pt presents with generalized confusion, AMS and unsure of pt baseline, difficulty performing mobility tasks, impaired sitting and standing balance, and decreased activity tolerance. Pt to benefit from acute PT to address deficits. Pt required up to mod assist for bed mobility and transfer to standing, pt tolerating lateral steps towards HOB only secondary to significant unsteadiness. PT unsure of pt baseline given pt is a poor historian, recommending SNF level of care post-acutely to address mobility deficits. PT to progress mobility as tolerated, and will continue to follow acutely.      Follow Up Recommendations SNF    Equipment Recommendations  Other (comment) (TBD)    Recommendations for Other Services       Precautions / Restrictions Precautions Precautions: Fall Precaution Comments: waist restraint, mitts Restrictions Weight Bearing Restrictions: No      Mobility  Bed Mobility Overal bed mobility: Needs Assistance Bed Mobility: Supine to Sit;Sit  to Supine     Supine to sit: Min assist Sit to supine: Min assist   General bed mobility comments: min assist for LE management, light trunk assist, and boost up in bed upon return to supine.    Transfers Overall transfer level: Needs assistance Equipment used: 1 person hand held assist Transfers: Sit to/from [facility] Sit to Stand: Mod assist Stand pivot transfers: Mod assist       General transfer comment: mod assist for sit<>stand for power up, rise, and steady. STS x2, second attempt to able to take lateral steps towards HOB with steadying and weight shift assist.  Ambulation/Gait             General Gait Details: unable this day, posterior unsteadiness and LE weakness with lateral stepping  Stairs            Wheelchair Mobility    Modified Rankin (Stroke Patients Only)       Balance Overall balance assessment: Needs assistance Sitting-balance support: No upper extremity supported;Feet supported Sitting balance-Leahy Scale: Fair     Standing balance support: No upper extremity supported;During functional activity Standing balance-Leahy Scale: Poor Standing balance comment: reliant on external assist, posterior unsteadiness                             Pertinent Vitals/Pain Pain Assessment: Faces Faces Pain Scale: No hurt Pain Intervention(s): Limited activity within patient's tolerance;Monitored during session    [place] expects to be discharged to:: Private residence Living Arrangements: Children Available Help at Discharge: Family (states he lives with his daughter) Type of Home: House Home Access: Stairs to enter Entrance  Stairs-Rails: None Entrance Stairs-Number of Steps: 2 Home Layout: One level Home Equipment: Cane - single point Additional Comments: equipment and home set up obtained from previous PT charting, pt states today he lives with his daughter in a house and has a few steps but not  PLOF    Prior Function           Comments: unsure of PLOF, pt could not tell me if he uses equipment. Pt does states "my daughter helps me, with everything"     Hand Dominance   Dominant Hand: Right    Extremity/Trunk Assessment   Upper Extremity Assessment Upper Extremity Assessment: Defer to OT evaluation    Lower Extremity Assessment Lower Extremity Assessment: Generalized weakness (no focal weakness when assessed)    Cervical / Trunk Assessment Cervical / Trunk Assessment: Normal  Communication   Communication: No difficulties  Cognition Arousal/Alertness: Awake/alert Behavior During Therapy: Restless Overall Cognitive Status: No family/caregiver present to determine baseline cognitive functioning Area of Impairment: Orientation;Attention;Memory;Following commands;Safety/judgement;Problem solving;Awareness                 Orientation Level: Disoriented to;Place;Time;Situation Current Attention Level: Sustained Memory: Decreased short-term memory Following Commands: Follows one step commands inconsistently Safety/Judgement: Decreased awareness of deficits;Decreased awareness of safety Awareness: Intellectual Problem Solving: Difficulty sequencing;Requires verbal cues;Requires tactile cues;Decreased initiation General Comments: Oriented to self only, states he is in [place] and then [place] after PT reorients. Pt unaware that he is in a hospital or what year it is. Pt states his socks are called "blue jeans" and PT's gloves are called "negligee", when PT corrected him pt states "well we all call stuff different things". Restless throughout session, pulling at lines/leads. Inconsistent command following.      General Comments      Exercises     Assessment/Plan    PT Assessment Patient needs continued PT services  PT Problem List Decreased strength;Decreased mobility;Decreased safety awareness;Decreased balance;Decreased activity tolerance;Decreased  knowledge of use of DME;Decreased cognition       PT Treatment Interventions DME instruction;Therapeutic activities;Gait training;Patient/family education;Therapeutic exercise;Balance training;Functional mobility training;Neuromuscular re-education;Stair training    PT Goals (Current goals can be found in the Care Plan section)  Acute Rehab PT Goals PT Goal Formulation: With patient Time For Goal Achievement: 10/03/20 Potential to Achieve Goals: Fair    Frequency Min 3X/week   Barriers to discharge        Co-evaluation               AM-PAC PT "6 Clicks" Mobility  Outcome Measure Help needed turning from your back to your side while in a flat bed without using bedrails?: A Little Help needed moving from lying on your back to sitting on the side of a flat bed without using bedrails?: A Little Help needed moving to and from a bed to a chair (including a wheelchair)?: A Lot Help needed standing up from a chair using your arms (e.g., wheelchair or bedside chair)?: A Lot Help needed to walk in hospital room?: A Lot Help needed climbing 3-5 steps with a railing? : Total 6 Click Score: 13    End of Session Equipment Utilized During Treatment: Other (comment) (waist restraint, mitts reapplied) Activity Tolerance: Patient limited by fatigue Patient left: in bed;with bed alarm set;with call bell/phone within reach;with restraints reapplied Nurse Communication: Mobility status PT Visit Diagnosis: Other abnormalities of gait and mobility (R26.89);Difficulty in walking, not elsewhere classified (R26.2)    Time: 2992-4268 PT Time Calculation (min) (ACUTE ONLY):  29 min   Charges:   PT Evaluation $PT Eval Low Complexity: 1 Low PT Treatments $Therapeutic Activity: 8-22 mins       Pasty Manninen S, PT Acute Rehabilitation Services Pager [phone]  Office [phone]   Jillien Yakel E Ruffin Pyo 09/19/2020, 1:10 PM

## 2020-09-19 NOTE — Progress Notes (Signed)
NAME:  Andres White MRN:  [id number] DOB:  11-24-1945 LOS: 3 ADMISSION DATE:  09/16/2020 DATE OF SERVICE:  09/16/2020 CHIEF COMPLAINT:  Status epilepticus   Brief History:  56 yoM from home with new onset seizure.  In SE requiring intubation for airway protection in ER.   History of Present Illness  This 75 y.o. African-American male presented to the [facility] Emergency Department via EMS with complaints of observed seizure activity at home.  Indeed, the patient had 2 additional seizure episodes while in the emergency department.  He has a known history of previous stroke; however, he has never been diagnosed with seizures.  He was intubated in the emergency department in a postictal state.  He has been loaded with Keppra and Dilantin.  Neurology is following.  Plans for EEG noted.  At the time of clinical interview, the patient is still under the effects of RSI medications for intubation.  Propofol was started after intubation but is currently on hold due to hypotension.  Per verbal report from ER team, the patient initially presented with lateral gaze preference and intermittent seizure-like activity.  He was treated with Ativan.  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   Past Medical/Surgical/Social/Family History   Past Medical History:  Diagnosis Date  . Arthritis   . Chronic back pain   . Gout   . Hypertension   . Stroke ([place]) 06/2020  . Syncope and collapse    Social History   Tobacco Use  . Smoking status: Current Every Day Smoker    Years: 20.00    Types: Cigarettes  . Smokeless tobacco: Never Used  Substance Use Topics  . Alcohol use: Not Currently    Comment: occasional    Family History  Problem Relation Age of Onset  . Hypertension Mother    Procedures:  2/13: Endotracheal intubation in ER  >>2/15  Significant Diagnostic Tests:  2/13: CTA head/neck shows aortic atherosclerosis; calcified plaque in both carotid bifurcations (20% right proximal ICA stenosis, 70% left proximal ICA stenosis); 50% stenosis of the nondominant right vertebral artery, severe (~90%) stenosis of the left vertebral artery; advanced, chronic cervical spondylosis; hazy density in the right upper lobe (lung) Addendum: potentially be acute low-density in the left cerebellar peduncle that could indicate acute infarction in that region.  Micro Data:  2/13 SARS >>neg 2/14 MRSA >> neg  Antimicrobials:  2/13 Zosyn   Interim history/subjective:   Self extubated yesterday and tolerated well No complaints this morning, however, quite confused   Objective   BP (!) 159/95   Pulse 84   Temp 98.6 F (37 C) (Oral)   Resp (!) 24   Ht 5\' 7"  (1.702 m)   Wt 82.7 kg   SpO2 (!) 88%   BMI 28.56 kg/m     Filed Weights   09/16/20 2104  Weight: 82.7 kg    Intake/Output Summary (Last 24 hours) at 09/19/2020 0724 Last data filed at 09/19/2020 0600 Gross per 24 hour  Intake 1920.05 ml  Output 2250 ml  Net -329.95 ml    Vent Mode: CPAP;PSV FiO2 (%):  [40 %] 40 % Set Rate:  [6 bmp] 6 bmp Vt Set:  [520 mL] 520 mL PEEP:  [5 cmH20] 5 cmH20 Pressure Support:  [10 cmH20] 10 cmH20 Plateau Pressure:  [15 cmH20] 15 cmH20   Examination: General:  [place]/AT, PERRL, no JVD Neuro: Awake, alert, confused CV: RRR, no MRG PULM:  Extubated, clear bilateral breath sounds, no distress.  GI: Soft, non-tender, non-distended Extremities: No acute deformity. Moving all extremities.  Skin: no rashes , lesions or breakdown noted  Resolved Hospital Problem list   Acute respiratory insufficiency requiring MV, self extubated 2/15. Hypotension  Assessment & Plan:   Status Epilepticus, new onset: in setting of prior stroke, and possibly new acute stroke.  -Primary management per neurology -EEG off -AEDs per neurology - keppra  500mg  BID, dilantin 100mg  TID -Aspirations precautions   Possible new acute CVA -CTA noted significant vertebral artery stenosis and left internal carotid artery stenosis along with cervical spondylolysis Hx prior CVA  - Will need MRI, but with his confusion I doubt it will be a high quality study. Will defer to neurology.  - holding home neurontin, Celexa - PT/SLP evals  Possible aspiration, some concern for RUL opacity initially. CXR improved PCT reassuring < 0.10 - No ABX, monitor clinically.  Hypertension with history of same.  - apresoline prn SBP >170 mmHg - Need SLP eval prior to starting home meds.  - continue ASA  Bradycardia> improved.  Prolonged QTc > improved - tele monitoring  - K goal > 4, Mag > 2  AoCKD stage IIIa, s/p dye exposure w/ CTA head/ neck. Serum creatinine slowly normalizing.  Hypokalemia - AM labs still pending. Will follow.  - Trend BMP / urinary output - Replace electrolytes as indicated   Anemia of chronic disease - trend CBC  Best practice:  Diet: NPO, SLP pending Pain/Anxiety/Delirium protocol (if indicated): NA VAP protocol (if indicated): Yes DVT prophylaxis: Heparin SQ GI prophylaxis: Protonix Glucose control: CBG monitoring and SSI Mobility/Activity: Bedrest CODE STATUS: Full code   Code Status: Full Code Family Communication:  Daughter [name] updated via phone 2/16 Disposition: tx to tele   Labs   CBC: Recent Labs  Lab 09/16/20 2147 09/16/20 2157 09/17/20 0420 09/17/20 0431 09/17/20 1107 09/18/20 0616 09/19/20 0216  WBC 9.4  --   --   --  7.8 9.7 11.4*  NEUTROABS 4.1  --   --   --   --   --   --   HGB 11.2*   < > 11.2* 10.9* 11.1* 11.0* 10.1*  HCT 38.5*   < > 33.0* 32.0* 36.9* 35.6* 32.7*  MCV 98.2  --   --   --  92.3 90.6 89.6  PLT 236  --   --   --  221 220 166   < > = values in this interval not displayed.    Basic Metabolic Panel: Recent Labs  Lab 09/16/20 2147 09/16/20 2157 09/16/20 2301 09/17/20 0420  09/17/20 0431 09/17/20 0556 09/17/20 0556 09/17/20 1107 09/17/20 1914 09/18/20 0616 09/18/20 1801 09/19/20 0216 09/19/20 0442  NA 140 140   < > 141 141 142  --   --   --  138 139  --   --   K 3.2* 3.2*   < > 2.8* 2.7* 3.0*  --  3.0*  --  3.3* 3.6  --   --   CL 105 108  --   --   --  108  --   --   --  105 107  --   --   CO2 13*  --   --   --   --  22  --   --   --  21* 21*  --   --  GLUCOSE 126* 113*  --   --   --  121*  --   --   --  124* 105*  --   --   BUN 9 10  --   --   --  8  --   --   --  8 9  --   --   CREATININE 1.48* 1.30*  --   --   --  1.34*  --   --   --  1.33* 1.14  --   --   CALCIUM 9.0  --   --   --   --  8.8*  --   --   --  8.6* 8.7*  --   --   MG  --   --   --   --   --  1.9   < > 1.9 2.0 2.2 2.1 QUANTITY NOT SUFFICIENT, UNABLE TO PERFORM TEST 1.8  PHOS  --   --   --   --   --   --   --  2.7 3.3 3.4 2.7  --   --    < > = values in this interval not displayed.   GFR: Estimated Creatinine Clearance: 55.8 mL/min (by C-G formula based on SCr of 1.14 mg/dL). Recent Labs  Lab 09/16/20 2147 09/17/20 1107 09/18/20 0616 09/19/20 0216  PROCALCITON <0.10  --  <0.10  --   WBC 9.4 7.8 9.7 11.4*    Liver Function Tests: Recent Labs  Lab 09/16/20 2147  AST 31  ALT 14  ALKPHOS 78  BILITOT 0.7  PROT 7.1  ALBUMIN 3.1*   No results for input(s): LIPASE, AMYLASE in the last 168 hours. No results for input(s): AMMONIA in the last 168 hours.  ABG    Component Value Date/Time   PHART 7.459 (H) 09/17/2020 0431   PCO2ART 32.6 09/17/2020 0431   PO2ART 316 (H) 09/17/2020 0431   HCO3 23.3 09/17/2020 0431   TCO2 24 09/17/2020 0431   ACIDBASEDEF 4.0 (H) 09/17/2020 0420   O2SAT 100.0 09/17/2020 0431     Coagulation Profile: Recent Labs  Lab 09/16/20 2147  INR 1.3*    Cardiac Enzymes: No results for input(s): CKTOTAL, CKMB, CKMBINDEX, TROPONINI in the last 168 hours.  HbA1C: Hgb A1c MFr Bld  Date/Time Value Ref Range Status  09/17/2020 11:07 AM 4.5 (L) 4.8  - 5.6 % Final    Comment:    (NOTE)         Prediabetes: 5.7 - 6.4         Diabetes: >6.4         Glycemic control for adults with diabetes: <7.0   06/25/2020 04:41 AM 4.8 4.8 - 5.6 % Final    Comment:    (NOTE) Pre diabetes:          5.7%-6.4%  Diabetes:              >6.4%  Glycemic control for   <7.0% adults with diabetes     CBG: Recent Labs  Lab 09/18/20 1130 09/18/20 1515 09/18/20 1918 09/18/20 2321 09/19/20 0321  GLUCAP 105* 118* 115* 101* 94    Critical care time: NA     Georgann Housekeeper, AGACNP-BC [facility]  See Amion for personal pager PCCM on call pager [phone] until 7pm. Please call Elink 7p-7a. [phone]  09/19/2020 8:42 AM

## 2020-09-19 NOTE — TOC Initial Note (Signed)
Transition of Care [facility]) - Initial/Assessment Note    Patient Details  Name: Andres White MRN: [id number] Date of Birth: 1946-05-24  [facility] CM/SW Contact:    Benard Halsted, LCSW Phone Number: 09/19/2020, 4:49 PM  Clinical Narrative:                 CSW received consult for possible SNF placement at time of discharge. CSW spoke with patient's daughter, [name], via phone. [name] reported that patient's family is currently unable to care for patient at their home given patient's current physical needs and fall risk. She expressed understanding of PT recommendation and is agreeable to SNF placement at time of discharge. She stated that patient had been to CIR last admission but she felt it was too intense for him. CSW discussed insurance authorization process and will provide Medicare SNF ratings list as beds become available. Patient has received the COVID vaccines.     Expected Discharge Plan: Skilled Nursing Facility Barriers to Discharge: Continued Medical Work up,SNF Pending bed offer   Patient Goals and CMS Choice Patient states their goals for this hospitalization and ongoing recovery are:: Rehab CMS Medicare.gov Compare Post Acute Care list provided to:: Patient Represenative (must comment) (daughter) Choice offered to / list presented to : Adult Children  Expected Discharge Plan and Services Expected Discharge Plan: [place] In-house Referral: Clinical Social Work   Post Acute Care Choice: [place] Living arrangements for the past 2 months: [place]                                      Prior Living Arrangements/Services Living arrangements for the past 2 months: Single Family Home Lives with:: Adult Children Patient language and need for interpreter reviewed:: Yes Do you feel safe going back to the place where you live?: Yes      Need for Family Participation in Patient Care: Yes (Comment) Care giver  support system in place?: Yes (comment) Current home services: Home OT,Home PT Criminal Activity/Legal Involvement Pertinent to Current Situation/Hospitalization: No - Comment as needed  Activities of Daily Living Home Assistive Devices/Equipment: Grab bars around toilet,Grab bars in shower,Hand-held shower hose,Cane (specify quad or straight),Wheelchair,Raised toilet seat with rails ADL Screening (condition at time of admission) Patient's cognitive ability adequate to safely complete daily activities?: Yes Is the patient deaf or have difficulty hearing?: No Does the patient have difficulty seeing, even when wearing glasses/contacts?: No Does the patient have difficulty concentrating, remembering, or making decisions?: Yes Patient able to express need for assistance with ADLs?: Yes Does the patient have difficulty dressing or bathing?: Yes Independently performs ADLs?: No Communication: Independent Dressing (OT): Needs assistance Is this a change from baseline?: Pre-admission baseline Grooming: Needs assistance Is this a change from baseline?: Pre-admission baseline Feeding: Independent Bathing: Needs assistance Is this a change from baseline?: Pre-admission baseline Toileting: Needs assistance Is this a change from baseline?: Pre-admission baseline In/Out Bed: Needs assistance Is this a change from baseline?: Pre-admission baseline Walks in Home: Independent with device (comment) Does the patient have difficulty walking or climbing stairs?: Yes Weakness of Legs: Right Weakness of Arms/Hands: Right  Permission Sought/Granted Permission sought to share information with : Facility Contact Representative,Family Supports Permission granted to share information with : No  Share Information with NAME: [name]  Permission granted to share info w AGENCY: SNFs  Permission granted to share info  w Relationship: Daugher  Permission granted to share info w Contact Information:  [phone]  Emotional Assessment Appearance:: Appears stated age Attitude/Demeanor/Rapport: Unable to Assess Affect (typically observed): Unable to Assess Orientation: : Oriented to Self Alcohol / Substance Use: Not Applicable Psych Involvement: No (comment)  Admission diagnosis:  Status epilepticus ([place]) [G40.901] Patient Active Problem List   Diagnosis Date Noted  . Endotracheally intubated 09/16/2020  . Community acquired pneumonia 09/16/2020  . Cervical spondylosis 09/16/2020  . 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 Status epilepticus ([place]) 09/16/2020  . CKD (chronic kidney disease) stage 3, GFR 30-59 ml/min (HCC) 09/16/2020  . Occult blood in stools   . Leukocytosis   . Hyponatremia   . History of hypertension   . Dysphagia, post-stroke   . Dysesthesia   . Neuropathic pain   . Brainstem infarct, acute ([place])   . Acute blood loss anemia   . AKI (acute kidney injury) ([place])   . Stroke (cerebrum) ([place]) 06/27/2020  . Right leg weakness   . Left carotid stenosis 04/13/2014   PCP:  [name], MD Pharmacy:   [facility] [phone] [address], [address] AT [facility] [address] [place] [address] Phone: [phone] Fax: [place], [place] [address] Phone: [phone] Fax: [phone]     Social Determinants of Health (SDOH) Interventions    Readmission Risk Interventions No flowsheet data found.

## 2020-09-19 NOTE — Progress Notes (Signed)
Called about patient's agitation  Placed an order for Ativan 1 mg every 4 as needed

## 2020-09-19 NOTE — Progress Notes (Signed)
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 In the ED, he was witnessed to have an episode of generalized stiffening followed by generalized shaking for approximately 1 minute that resolved spontaneously. He was given 1 mg Ativan, 2,500 mg Keppra load without resolution of forced gaze, he was intubated and given a 1,500 mg fosphenytoin load. CT imaging negative for acute changes, with evidence of multiple old strokes. Started on maintenance Keppra 500 mg BID, fosphenytoin 100 mg TID EEG 2/13: suggestive of moderate diffuse encephalopathy without seizures or epileptiform discharges EEG 2/14: suggestive of mild diffuse encephalopathy, right upper extremity tremor without concomitant EEG change. EEG 2/15: suggestive of moderate diffuse encephalopathy, nonspecific etiology. No definite seizures of epileptiform discharges seen. LTM EEG discontinued. 2/16: Patient self extubated, off sedation. MRI brain ordered for further assessment of seizure etiology.  Subjective: Patient self-extubated overnight, on 2L . Off sedation. Transferred to 3W this afternoon. Pleasantly confused on examination.  Of note, patient states that he drinks one pint of liquor daily.  Exam: Vitals:   09/19/20 0600 09/19/20 0700  BP: (!) 159/95 (!) 164/78  Pulse: 84   Resp:    Temp:    SpO2: (!) 88%    Gen: In bed, awake, laughing with staff Resp: non-labored breathing, no respiratory distress Abd: soft, non-distended, non-tender  Neuro: Mental Status: Patient is awake, follows commands. He is oriented to self, states he is in the hospital but unable to state the hospital name or city, he states it is 2002. When asked the month he  states "It is October 25, 1945". His speech is dysarthric, however, patient is edentulous at baseline. No aphasia or neglect noted. He is unable to give a clear or coherent history. States "he doesn't think" he has ever had a seizure before. Sometimes perseverates during conversation.  Cranial Nerves II - XII - II - PERRL 3 mm/brisk, visual fields full III, IV, VI - EOMI, no ptosis noted V - Face sensation intact and symmetrical to light touch VII - Face is symmetric resting and smiline VIII - Hearing intact bilaterally X: Palate elevates symmetrically. Phonation normal.  XI - Shoulder shrug intact and symmetric bilaterally.  XII - Tongue protrudes midline without atrophy or fasciculations.  Motor: moves all extremities antigravity, 5/5 strength in bilateral lower extremities and left upper extremity, right upper extremity 4/5 states it feels "heavy" with minimal drift with antigravity movement. Patient claims right upper extremity is always "heavy". Ankle dorsi/plantar flexion 5/5. Sensory: Intact and symmetric to light touch bilaterally in all four extremities  Pertinent Labs: CBC    Component Value Date/Time   WBC 11.4 (H) 09/19/2020 0216   RBC 3.65 (L) 09/19/2020 0216   HGB 10.1 (L) 09/19/2020 0216   HGB 12.6 (L) 03/23/2014 2009   HCT 32.7 (L) 09/19/2020 0216   HCT 39.7 (L) 03/23/2014 2009   PLT 166 09/19/2020 0216   PLT 176 03/23/2014 2009   MCV 89.6 09/19/2020 0216   MCV 95 03/23/2014 2009   MCH 27.7 09/19/2020 0216   MCHC 30.9 09/19/2020 0216   RDW 17.2 (H) 09/19/2020 0216   RDW 14.1 03/23/2014 2009   LYMPHSABS 4.6 (H) 09/16/2020 2147   LYMPHSABS 2.9 03/23/2014 2009   MONOABS 0.5 09/16/2020 2147   MONOABS  0.5 03/23/2014 2009   EOSABS 0.1 09/16/2020 2147   EOSABS 0.1 03/23/2014 2009   BASOSABS 0.0 09/16/2020 2147   BASOSABS 0.0 03/23/2014 2009   CMP     Component Value Date/Time   NA 139 09/19/2020 0442   NA 134 (L) 03/23/2014 2009   K 3.7 09/19/2020 0442   K 3.5  03/23/2014 2009   CL 107 09/19/2020 0442   CL 106 03/23/2014 2009   CO2 20 (L) 09/19/2020 0442   CO2 17 (L) 03/23/2014 2009   GLUCOSE 91 09/19/2020 0442   GLUCOSE 116 (H) 03/23/2014 2009   BUN 7 (L) 09/19/2020 0442   BUN 36 (H) 03/23/2014 2009   CREATININE 0.96 09/19/2020 0442   CREATININE 1.88 (H) 03/23/2014 2009   CALCIUM 8.9 09/19/2020 0442   CALCIUM 8.9 03/23/2014 2009   PROT 7.1 09/19/2020 0442   PROT 8.2 01/06/2014 1157   ALBUMIN 2.9 (L) 09/19/2020 0442   ALBUMIN 3.5 01/06/2014 1157   AST 34 09/19/2020 0442   AST 51 (H) 01/06/2014 1157   ALT 14 09/19/2020 0442   ALT 43 01/06/2014 1157   ALKPHOS 70 09/19/2020 0442   ALKPHOS 76 01/06/2014 1157   BILITOT 0.4 09/19/2020 0442   BILITOT 0.4 01/06/2014 1157   GFRNONAA >60 09/19/2020 0442   GFRNONAA 35 (L) 03/23/2014 2009   GFRAA 32 (L) 11/18/2017 1830   GFRAA 41 (L) 03/23/2014 2009   Alcohol Level    Component Value Date/Time   [facility] <10 09/16/2020 2147   Assessment:75 year old man with 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   Impression: 1. New onset seizure and status epilepticus; patient claims he drinks daily though he remains confused. Unsure if reliable information at this time.  2. History of strokes 3. Possible acute low-density in the left cerebellar peduncle infarct  4. S/p Intubation for airway protection, concern for possible aspiration. Self extubated 2/16, now on room air.   Recommendations:  Loaded with Keppra and Dilantin  Continue Keppra 500mg  twice daily   Continue Dilantin 100mg  3 times daily  LTM read shows no seizure activity, tremors seen on exam communicated to Dr. Hortense Ramal who reviewed LTM and did not see any EEG correlate to suggest seizure with noted tremor activity. Per her advice will check levels and consider adjusting doses.   MRI brain ordered with stabilization and no EEG evidence of seizures/status; off  LTM today. Patient with bird shot in lower extremity, hold MRI until oriented to provide further information prior to obtaining MRI.   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  Recent Echo 06/25/2020 showed EF 60-65%, no shunt   Recent long term monitoring (Dec 21 and Jan 22) without atrial fibrillation identified   On ASA 81 mg PTA, currently ordered  Neurology will continue to follow.    Anibal Henderson, AGACNP-BC [facility] [phone]

## 2020-09-19 NOTE — Evaluation (Signed)
Clinical/Bedside Swallow Evaluation Patient Details  Name: Andres White MRN: [id number] Date of Birth: 1945/08/10  Today's Date: 09/19/2020 Time: SLP Start Time (ACUTE ONLY): 7673 SLP Stop Time (ACUTE ONLY): 0925 SLP Time Calculation (min) (ACUTE ONLY): 21 min  Past Medical History:  Past Medical History:  Diagnosis Date  . Arthritis   . Chronic back pain   . Gout   . Hypertension   . Stroke ([place]) 06/2020  . Syncope and collapse    Past Surgical History:  Past Surgical History:  Procedure Laterality Date  . BIOPSY  07/13/2020   Procedure: BIOPSY;  Surgeon: Ladene Artist, MD;  Location: [facility] ENDOSCOPY;  Service: Endoscopy;;  . CARDIAC CATHETERIZATION  2015   [facility]   . COLONOSCOPY WITH PROPOFOL N/A 07/13/2020   Procedure: COLONOSCOPY WITH PROPOFOL;  Surgeon: Ladene Artist, MD;  Location: [facility] ENDOSCOPY;  Service: Endoscopy;  Laterality: N/A;  . ESOPHAGOGASTRODUODENOSCOPY N/A 07/13/2020   Procedure: ESOPHAGOGASTRODUODENOSCOPY (EGD);  Surgeon: Ladene Artist, MD;  Location[facility] ENDOSCOPY;  Service: Endoscopy;  Laterality: N/A;  . KNEE SURGERY    . POLYPECTOMY  07/13/2020   Procedure: POLYPECTOMY;  Surgeon: Ladene Artist, MD;  Location: [facility] ENDOSCOPY;  Service: Endoscopy;;   HPI:  Pt is a 75 y.o. male who arrived as a code stroke with seizure activity upon arrival. Pt intubated from 09/16/20 to 09/18/20 (self-extubated). CT Head from 2/13 showed potential acute low-density in the left cerebellar peduncle that could  indicate acute infarction in that region. CXR from 2/14 showed equivocal left pleural effusion mild bibasilar atelectasis; no edema or airspace opacities noted. PMH includes syncope, CVA, HTN. Pt was seen by SLP after CVA in 2021, initially on Dys 2 diet to facilitate oral prep in light of mentation and no dentition, but advanced to Dys 3 before discharge.   Assessment / Plan / Recommendation Clinical Impression  Pt demonstrated coughing following all PO trials of thin  liquids which appeared to be alleviated with nectar thick liquids even when challegend with consecutive sips. No overt s/s of aspiration were noted with purees or solids. Previous evaluation from 06/30/20 noted oral residue and buccal pocketing which weren't demonstrated during today's assessment. Pt's vocal quality does not appear dysphonic despite self- extubation. Recomend DYS 3 diet which is most consistent with his diet PTA, and nectar thick liquids given coughing that was noted with thin liquids. SLP will continue to f/u for potential upgrade to thin liquids.  SLP Visit Diagnosis: Dysphagia, unspecified (R13.10)    Aspiration Risk  Mild aspiration risk    Diet Recommendation Dysphagia 3 (Mech soft);Nectar-thick liquid   Liquid Administration via: Cup;Straw Medication Administration: Whole meds with puree Supervision: Staff to assist with self feeding;Full supervision/cueing for compensatory strategies Compensations: Minimize environmental distractions;Slow rate;Small sips/bites Postural Changes: Seated upright at 90 degrees    Other  Recommendations Oral Care Recommendations: Oral care BID Other Recommendations: Order thickener from pharmacy;Prohibited food (jello, ice cream, thin soups);Remove water pitcher   Follow up Recommendations Skilled Nursing facility;24 hour supervision/assistance      Frequency and Duration min 2x/week  2 weeks       Prognosis Prognosis for Safe Diet Advancement: Good      Swallow Study   General HPI: Pt is a 75 y.o. male who arrived as a code stroke with seizure activity upon arrival. Pt intubated from 09/16/20 to 09/18/20 (self-extubated). CT Head from 2/13 showed potential acute low-density in the left cerebellar peduncle that could  indicate acute infarction in  that region. CXR from 2/14 showed equivocal left pleural effusion mild bibasilar atelectasis; no edema or airspace opacities noted. PMH includes syncope, CVA, HTN. Pt was seen by SLP after CVA  in 2021, initially on Dys 2 diet to facilitate oral prep in light of mentation and no dentition, but advanced to Dys 3 before discharge. Type of Study: Bedside Swallow Evaluation Previous Swallow Assessment: Yes Diet Prior to this Study: NPO Temperature Spikes Noted: No History of Recent Intubation: Yes Length of Intubations (days): 3 days Date extubated: 09/18/20 Behavior/Cognition: Alert;Cooperative Oral Cavity Assessment: Within Functional Limits Oral Care Completed by SLP: No Oral Cavity - Dentition: Edentulous Vision: Functional for self-feeding Self-Feeding Abilities: Needs assist Patient Positioning: Upright in bed Baseline Vocal Quality: Normal Volitional Cough: Strong Volitional Swallow: Able to elicit    Oral/Motor/Sensory Function Overall Oral Motor/Sensory Function: Within functional limits   Ice Chips Ice chips: Within functional limits Presentation: Spoon   Thin Liquid Thin Liquid: Impaired Presentation: Self Fed;Spoon;Straw;Cup Pharyngeal  Phase Impairments: Cough - Immediate    Nectar Thick Nectar Thick Liquid: Within functional limits   Honey Thick Honey Thick Liquid: Not tested   Puree Puree: Within functional limits   Solid     Solid: Within functional limits     Jeanine Luz., SLP Student 09/19/2020,12:18 PM

## 2020-09-20 ENCOUNTER — Inpatient Hospital Stay (HOSPITAL_COMMUNITY): Payer: Medicare Other

## 2020-09-20 DIAGNOSIS — I6503 Occlusion and stenosis of bilateral vertebral arteries: Secondary | ICD-10-CM | POA: Diagnosis not present

## 2020-09-20 DIAGNOSIS — I6522 Occlusion and stenosis of left carotid artery: Secondary | ICD-10-CM | POA: Diagnosis not present

## 2020-09-20 LAB — BASIC METABOLIC PANEL
Anion gap: 13 (ref 5–15)
BUN: 5 mg/dL — ABNORMAL LOW (ref 8–23)
CO2: 21 mmol/L — ABNORMAL LOW (ref 22–32)
Calcium: 9.1 mg/dL (ref 8.9–10.3)
Chloride: 103 mmol/L (ref 98–111)
Creatinine, Ser: 0.88 mg/dL (ref 0.61–1.24)
GFR, Estimated: >60 mL/min (ref >60)
Glucose, Bld: 105 mg/dL — ABNORMAL HIGH (ref 70–99)
Potassium: 3.2 mmol/L — ABNORMAL LOW (ref 3.5–5.1)
Sodium: 137 mmol/L (ref 135–145)

## 2020-09-20 LAB — GLUCOSE, CAPILLARY
Glucose-Capillary: 102 mg/dL — ABNORMAL HIGH (ref 70–99)
Glucose-Capillary: 108 mg/dL — ABNORMAL HIGH (ref 70–99)
Glucose-Capillary: 110 mg/dL — ABNORMAL HIGH (ref 70–99)
Glucose-Capillary: 112 mg/dL — ABNORMAL HIGH (ref 70–99)
Glucose-Capillary: 114 mg/dL — ABNORMAL HIGH (ref 70–99)
Glucose-Capillary: 131 mg/dL — ABNORMAL HIGH (ref 70–99)
Glucose-Capillary: 55 mg/dL — ABNORMAL LOW (ref 70–99)
Glucose-Capillary: 97 mg/dL (ref 70–99)

## 2020-09-20 LAB — PHOSPHORUS: Phosphorus: 3.4 mg/dL (ref 2.5–4.6)

## 2020-09-20 LAB — CBC
HCT: 34.5 % — ABNORMAL LOW (ref 39.0–52.0)
Hemoglobin: 10.8 g/dL — ABNORMAL LOW (ref 13.0–17.0)
MCH: 27.6 pg (ref 26.0–34.0)
MCHC: 31.3 g/dL (ref 30.0–36.0)
MCV: 88 fL (ref 80.0–100.0)
Platelets: 215 10*3/uL (ref 150–400)
RBC: 3.92 MIL/uL — ABNORMAL LOW (ref 4.22–5.81)
RDW: 17.2 % — ABNORMAL HIGH (ref 11.5–15.5)
WBC: 7.4 10*3/uL (ref 4.0–10.5)
nRBC: 0 % (ref 0.0–0.2)

## 2020-09-20 LAB — MAGNESIUM: Magnesium: 1.7 mg/dL (ref 1.7–2.4)

## 2020-09-20 MED ORDER — COLCHICINE 0.6 MG PO TABS
0.6000 mg | ORAL_TABLET | Freq: Every day | ORAL | Status: DC
  Administered 2020-09-20 – 2020-09-25 (×6): 0.6 mg via ORAL
  Filled 2020-09-20 (×6): qty 1

## 2020-09-20 MED ORDER — LORAZEPAM 2 MG/ML IJ SOLN
1.0000 mg | Freq: Once | INTRAMUSCULAR | Status: AC
  Administered 2020-09-21: 1 mg via INTRAVENOUS
  Filled 2020-09-20: qty 1

## 2020-09-20 NOTE — Evaluation (Signed)
Occupational Therapy Evaluation Patient Details Name: Andres White MRN: [id number] DOB: 09-29-1945 Today's Date: 09/20/2020    History of Present Illness Pt is a 75 y.o. male with medical history significant of hypertension, arthritis, tobacco use x20 years, gout, previous CVAs (left midbrain, left frontal and parietal lobes, right parietotemporal lobe, bilateral thalamus, and bilateral cerebellum) presents to ED on 2/13 with seizures with L and upwards gaze deviation. x2 more seizures in ED. CTA head and neck shows calcified plaque at both carotid 5 locations with 20% stenosis of the proximal ICA on the right, 70% stenosis of the proximal ICA on the left. 50% stenosis of the origin of the nondominant right vertebral artery. CT initially shows no acute changes, repeat CT shows acute low-density area in L cerebellar peduncle which could indicate acute infarct. EEG shows moderate diffuse encephalopathy, ETT 2/13-2/15.   Clinical Impression   Patient met lying supine in bed asleep requiring increased time to arouse. Patient lethargic from Ativan given this a.m. Per chart review, patient was living with family in a private residence and may have required assist for ADLs at baseline. Patient unable to provide home set-up or PLOF 2/2 decreased cognition and lethargy. Patient presents with deficits not limited to decreased sitting/standing balance, decreased safety awareness, generalized weakness with noted weaker gross grasp on R, and decreased cognition. Unable to formally assess MMT 2/2 patient with decreased level of arousal and ability to follow 1-step verbal commands with only 50% accuracy this date. Patient able to stand from EOB to RW with max cues for had placement, power negotiation and sequencing and able to complete seated lateral scoots toward [facility] with Min guard for steadying. Did not progress beyond EOB 2/2 lethargy. Patient would benefit from continued acute OT services to maximize safety and  independence with self-care tasks, decrease risk of falls, and decrease caregiver burden. Given patient CLOF, recommendation for SNF rehab.     Follow Up Recommendations  SNF;Supervision/Assistance - 24 hour    Equipment Recommendations  Other (comment) (Please defer to next level of care.)    Recommendations for Other Services       Precautions / Restrictions Precautions Precautions: Fall Precaution Comments: waist restraint, bilateral mitts, rectal tube Restrictions Weight Bearing Restrictions: No      Mobility Bed Mobility Overal bed mobility: Needs Assistance Bed Mobility: Supine to Sit;Sit to Supine     Supine to sit: Mod assist Sit to supine: Mod assist   General bed mobility comments: Assist to advance BLE toward EOB and elevate trunk. Patient requiring more assist 2/2 lethargy.    Transfers Overall transfer level: Needs assistance Equipment used: 1 person hand held assist Transfers: Sit to/from Stand;Lateral/Scoot Transfers Sit to Stand: Mod assist        Lateral/Scoot Transfers: Min guard General transfer comment: Mod A for sit to stand from EOB to RW with heavy cues for hand placement, power negotiation and technique. Patient able to complete lateral scoots toward [facility] with Min guard for steadying/safety.    Balance Overall balance assessment: Needs assistance Sitting-balance support: No upper extremity supported;Feet supported Sitting balance-Leahy Scale: Poor Sitting balance - Comments: External assist required to maintain static sitting balance this date. Likely 2/2 lethargy from Ativan.   Standing balance support: No upper extremity supported;During functional activity Standing balance-Leahy Scale: Poor Standing balance comment: reliant on external assist, posterior unsteadiness  ADL either performed or assessed with clinical judgement   ADL Overall ADL's : Needs assistance/impaired                 Upper  Body Dressing : Moderate assistance;Sitting Upper Body Dressing Details (indicate cue type and reason): Cues for upright posture with patient preferring to rest forarms on knees. Assist to thread BUE and to maintain static sitting balance at EOB. Lower Body Dressing: Maximal assistance;Bed level Lower Body Dressing Details (indicate cue type and reason): Max A to don footwear. Patient able to follow cues to lift BLE off bed surface to don socks.               General ADL Comments: Patient limited by decreased cognition, generalized weakness, and poor sitting/standing balance.     Vision Baseline Vision/History:  (Unable to state.) Patient Visual Report: Other (comment) (Unable to state 2/2 decreased level of arousal.) Additional Comments: Will continue to assess.     Perception     Praxis      Pertinent Vitals/Pain Pain Assessment: Faces Faces Pain Scale: No hurt Pain Location: Generalized discomfort with movement. Pain Descriptors / Indicators: Grimacing Pain Intervention(s): Monitored during session;Repositioned     Hand Dominance Right   Extremity/Trunk Assessment Upper Extremity Assessment Upper Extremity Assessment: Generalized weakness;Difficult to assess due to impaired cognition (Not formally assessed 2/2 decreased cognition and lethargy. Spontaneous movement of BUE. Gross grasp appears decreased on R.)       Cervical / Trunk Assessment Cervical / Trunk Assessment: Normal   Communication Communication Communication: No difficulties   Cognition Arousal/Alertness: Lethargic;Suspect due to medications Behavior During Therapy: Restless Overall Cognitive Status: No family/caregiver present to determine baseline cognitive functioning Area of Impairment: Orientation;Attention;Memory;Following commands;Safety/judgement;Problem solving;Awareness                 Orientation Level: Disoriented to;Place;Time;Situation Current Attention Level: Sustained Memory:  Decreased short-term memory Following Commands: Follows one step commands inconsistently Safety/Judgement: Decreased awareness of deficits;Decreased awareness of safety Awareness: Intellectual Problem Solving: Difficulty sequencing;Requires verbal cues;Requires tactile cues;Decreased initiation General Comments: Patient oriented to person only. States that the month is May and the year is "the 1980". Patient unable to hold casual conversation but follows 1-step verbal commands with 50% accuracy and max repeat cueing.   General Comments       Exercises     Shoulder Instructions      Home Living Family/patient expects to be discharged to:: Private residence Living Arrangements: Children Available Help at Discharge: Family (states he lives with his daughter) Type of Home: House Home Access: Stairs to enter Technical brewer of Steps: 2 Entrance Stairs-Rails: None Home Layout: One level     Bathroom Shower/Tub: Teacher, early years/pre: Standard     Home Equipment: [employer] - single point   Additional Comments: equipment and home set up obtained from previous PT charting, pt states today he lives with his daughter in a house and has a few steps but not PLOF      Prior Functioning/Environment          Comments: Unknown. Patient is a poor historian. Per chart review, patient reports assist with ADLs from daughter.        OT Problem List: Decreased strength;Decreased range of motion;Decreased activity tolerance;Impaired balance (sitting and/or standing);Decreased coordination;Decreased cognition;Decreased safety awareness;Decreased knowledge of use of DME or AE;Decreased knowledge of precautions;Impaired UE functional use      OT Treatment/Interventions: Self-care/ADL training;Therapeutic exercise;Energy conservation;DME and/or AE instruction;Therapeutic activities;Patient/family  education;Balance training    OT Goals(Current goals can be found in the care plan  section) Acute Rehab OT Goals Patient Stated Goal: None stated. OT Goal Formulation: Patient unable to participate in goal setting Time For Goal Achievement: 10/04/20 Potential to Achieve Goals: Fair ADL Goals Pt Will Perform Eating: with set-up;sitting Pt Will Perform Grooming: standing;with min guard assist Pt Will Perform Upper Body Dressing: with set-up;sitting Pt Will Perform Lower Body Dressing: sit to/from stand;with min guard assist Pt Will Transfer to Toilet: ambulating;with min guard assist Pt Will Perform Toileting - Clothing Manipulation and hygiene: sit to/from stand;with min guard assist Additional ADL Goal #1: Patient will maintain static sitting balance at EOB with no more than supervision A in prep for ADLs.  OT Frequency: Min 2X/week   Barriers to D/C: Decreased caregiver support  Family unable to provide current level of assist.       Co-evaluation              AM-PAC OT "6 Clicks" Daily Activity     Outcome Measure Help from another person eating meals?: A Lot Help from another person taking care of personal grooming?: A Lot Help from another person toileting, which includes using toliet, bedpan, or urinal?: Total (Condom cath and rectal tube) Help from another person bathing (including washing, rinsing, drying)?: A Lot Help from another person to put on and taking off regular upper body clothing?: A Lot Help from another person to put on and taking off regular lower body clothing?: A Lot 6 Click Score: 11   End of Session Equipment Utilized During Treatment: Gait belt;Rolling walker  Activity Tolerance: Patient limited by lethargy Patient left: in bed;with call bell/phone within reach;with chair alarm set;with restraints reapplied  OT Visit Diagnosis: Unsteadiness on feet (R26.81);Other abnormalities of gait and mobility (R26.89);Muscle weakness (generalized) (M62.81)                Time: 3220-2542 OT Time Calculation (min): 32 min Charges:  OT  General Charges $OT Visit: 1 Visit OT Evaluation $OT Eval Moderate Complexity: 1 Mod OT Treatments $Therapeutic Activity: 8-22 mins  Dennette Faulconer H. OTR/L Supplemental OT, Department of rehab services [phone]  Janaki Exley R H. 09/20/2020, 11:46 AM

## 2020-09-20 NOTE — Progress Notes (Signed)
PROGRESS NOTE    Andres White   EXN:[id number]  DOB: [birth year]  DOA: 09/16/2020 PCP: [name], MD   Brief Narrative:  Andres White is a 75 year old male with hypertension, gout and a history of CVA in 12/29 who presented to the hospital for seizure-like activity at home.  He had further seizures while in the ED and was intubated for airway protection while in a post ictal state.  He was loaded with Keppra and and Dilantin and admitted to the ICU. On 2/16, the patient self extubated. He is transferred to the triad hospitalist service on 2/17.  Subjective: No complaints.     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 - Per CTA, there is 70% stenosis of the left proximal ICA and 90% stenosis of left vertebral artery  - cont aspirin, statin and risk factor modification for stroke prevention  Hypertension -The patient is on metoprolol as outpatient which is currently on hold and is receiving amlodipine in the hospital  GERD -Continue Protonix  BPH -Continue Flomax  H/o Gout - on daily Colchicine- can continue this    Time spent in minutes: 35 DVT prophylaxis: heparin injection 5,000 Units Start: 09/17/20 1400 SCDs Start: 09/16/20 2315  Code Status: Full code Family Communication:  Level of Care: Level of care: Telemetry Medical Disposition Plan:  Status is: Inpatient  Remains inpatient appropriate because:Inpatient level of care appropriate due to severity of illness   Dispo: The patient is from: Home              Anticipated d/c is to: TBD              Anticipated d/c date is: 2 days               Patient currently is not medically stable to d/c.   Difficult to place patient No      Consultants:   Neurology  PCCM Procedures:     Antimicrobials:  Anti-infectives (From admission, onward)   Start     Dose/Rate Route Frequency Ordered Stop   09/16/20 2330  piperacillin-tazobactam (ZOSYN) IVPB 3.375 g  Status:  Discontinued        3.375 g 12.5 mL/hr over 240 Minutes Intravenous Every 8 hours 09/16/20 2325 09/17/20 1110       Objective: Vitals:   09/19/20 2015 09/20/20 0024 09/20/20 0447 09/20/20 0500  BP: (!) 142/83 136/84 (!) 177/91   Pulse: 89 89 75   Resp: 17 18 20    Temp: 98 F (36.7 C) 98.6 F (37 C) 97.7 F (36.5 C)   TempSrc:   Oral   SpO2:   99%   Weight:    82.6 kg  Height:        Intake/Output Summary (Last 24 hours) at 09/20/2020 0741 Last data filed at 09/19/2020 2359 Gross per 24 hour  Intake 841.88 ml  Output 950 ml  Net -108.12 ml   Filed Weights   09/16/20 2104 09/20/20 0500  Weight: 82.7 kg 82.6 kg    Examination: General exam: Appears comfortable  HEENT: PERRLA, oral mucosa moist, no sclera icterus or thrush Respiratory system:  Clear to auscultation. Respiratory effort normal. Cardiovascular system: S1 & S2 heard, RRR.   Gastrointestinal system: Abdomen soft, non-tender, nondistended. Normal bowel sounds. Central nervous system: Alert and oriented. No focal neurological deficits. Extremities: No cyanosis, clubbing or edema Skin: No rashes or ulcers Psychiatry:  Mood & affect appropriate.     Data Reviewed: I have personally reviewed following labs and imaging studies  CBC: Recent Labs  Lab 09/16/20 2147 09/16/20 2157 09/17/20 0420 09/17/20 0431 09/17/20 1107 09/18/20 0616 09/19/20 0216  WBC 9.4  --   --   --  7.8 9.7 11.4*  NEUTROABS 4.1  --   --   --   --   --   --   HGB 11.2*   < > 11.2* 10.9* 11.1* 11.0* 10.1*  HCT 38.5*   < > 33.0* 32.0* 36.9* 35.6* 32.7*  MCV 98.2  --   --   --  92.3 90.6 89.6  PLT  236  --   --   --  221 220 166   < > = values in this interval not displayed.   Basic Metabolic Panel: Recent Labs  Lab 09/16/20 2147 09/16/20 2157 09/16/20 2301 09/17/20 0431 09/17/20 0431 09/17/20 0556 09/17/20 1107 09/17/20 1914 09/18/20 0616 09/18/20 1801 09/19/20 0216 09/19/20 0442  NA 140 140   < > 141  --  142  --   --  138 139  --  139  K 3.2* 3.2*   < > 2.7*  --  3.0* 3.0*  --  3.3* 3.6  --  3.7  CL 105 108  --   --   --  108  --   --  105 107  --  107  CO2 13*  --   --   --   --  22  --   --  21* 21*  --  20*  GLUCOSE 126* 113*  --   --   --  121*  --   --  124* 105*  --  91  BUN 9 10  --   --   --  8  --   --  8 9  --  7*  CREATININE 1.48* 1.30*  --   --   --  1.34*  --   --  1.33* 1.14  --  0.96  CALCIUM 9.0  --   --   --   --  8.8*  --   --  8.6* 8.7*  --  8.9  MG  --   --   --   --    < > 1.9 1.9 2.0 2.2 2.1 QUANTITY NOT SUFFICIENT, UNABLE TO PERFORM TEST 1.8  PHOS  --   --   --   --   --   --  2.7 3.3 3.4 2.7  --   --    < > = values in this interval not displayed.   GFR: Estimated Creatinine Clearance: 66.3 mL/min (by C-G formula based on SCr of 0.96 mg/dL). Liver Function Tests: Recent Labs  Lab 09/16/20 2147 09/19/20 0442  AST 31 34  ALT 14 14  ALKPHOS 78 70  BILITOT 0.7 0.4  PROT 7.1 7.1  ALBUMIN 3.1* 2.9*   No results for input(s): LIPASE, AMYLASE in the last 168 hours. No results for input(s): AMMONIA in the last 168 hours. Coagulation Profile: Recent Labs  Lab 09/16/20 2147  INR 1.3*   Cardiac Enzymes: No results for input(s): CKTOTAL, CKMB, CKMBINDEX, TROPONINI in  the last 168 hours. BNP (last 3 results) No results for input(s): PROBNP in the last 8760 hours. HbA1C: Recent Labs    09/17/20 1107  HGBA1C 4.5*   CBG: Recent Labs  Lab 09/19/20 1501 09/19/20 1828 09/19/20 2133 09/20/20 0025 09/20/20 0417  GLUCAP 125* 97 102* 97 110*   Lipid Profile: Recent Labs    09/18/20 0616  CHOL 79  HDL 44  LDLCALC 19  TRIG 81   CHOLHDL 1.8   Thyroid Function Tests: No results for input(s): TSH, T4TOTAL, FREET4, T3FREE, THYROIDAB in the last 72 hours. Anemia Panel: No results for input(s): VITAMINB12, FOLATE, FERRITIN, TIBC, IRON, RETICCTPCT in the last 72 hours. Urine analysis:    Component Value Date/Time   COLORURINE COLORLESS (A) 09/17/2020 0212   APPEARANCEUR CLEAR 09/17/2020 0212   APPEARANCEUR Clear 03/23/2014 2009   LABSPEC 1.009 09/17/2020 0212   LABSPEC 1.010 03/23/2014 2009   PHURINE 7.0 09/17/2020 0212   GLUCOSEU NEGATIVE 09/17/2020 0212   GLUCOSEU Negative 03/23/2014 2009   HGBUR LARGE (A) 09/17/2020 0212   BILIRUBINUR NEGATIVE 09/17/2020 0212   BILIRUBINUR Negative 03/23/2014 2009   KETONESUR 5 (A) 09/17/2020 0212   PROTEINUR NEGATIVE 09/17/2020 0212   NITRITE NEGATIVE 09/17/2020 0212   LEUKOCYTESUR NEGATIVE 09/17/2020 0212   LEUKOCYTESUR Negative 03/23/2014 2009   Sepsis Labs: @LABRCNTIP (procalcitonin:4,lacticidven:4) ) Recent Results (from the past 240 hour(s))  Resp Panel by RT-PCR (Flu A&B, Covid)     Status: None   Collection Time: 09/16/20  9:29 PM   Specimen: Nasopharyngeal(NP) swabs in vial transport medium  Result Value Ref Range Status   SARS Coronavirus 2 by RT PCR NEGATIVE NEGATIVE Final    Comment: (NOTE) SARS-CoV-2 target nucleic acids are NOT DETECTED.  The SARS-CoV-2 RNA is generally detectable in upper respiratory specimens during the acute phase of infection. The lowest concentration of SARS-CoV-2 viral copies this assay can detect is 138 copies/mL. A negative result does not preclude SARS-Cov-2 infection and should not be used as the sole basis for treatment or other patient management decisions. A negative result may occur with  improper specimen collection/handling, submission of specimen other than nasopharyngeal swab, presence of viral mutation(s) within the areas targeted by this assay, and inadequate number of viral copies(<138 copies/mL). A negative  result must be combined with clinical observations, patient history, and epidemiological information. The expected result is Negative.  Fact Sheet for Patients:  EntrepreneurPulse.com.au  Fact Sheet for Healthcare Providers:  IncredibleEmployment.be  This test is no t yet approved or cleared by the [place] FDA and  has been authorized for detection and/or diagnosis of SARS-CoV-2 by FDA under an Emergency Use Authorization (EUA). This EUA will remain  in effect (meaning this test can be used) for the duration of the COVID-19 declaration under Section 564(b)(1) of the Act, 21 U.S.C.section 360bbb-3(b)(1), unless the authorization is terminated  or revoked sooner.       Influenza A by PCR NEGATIVE NEGATIVE Final   Influenza B by PCR NEGATIVE NEGATIVE Final    Comment: (NOTE) The Xpert Xpress SARS-CoV-2/FLU/RSV plus assay is intended as an aid in the diagnosis of influenza from Nasopharyngeal swab specimens and should not be used as a sole basis for treatment. Nasal washings and aspirates are unacceptable for Xpert Xpress SARS-CoV-2/FLU/RSV testing.  Fact Sheet for Patients: EntrepreneurPulse.com.au  Fact Sheet for Healthcare Providers: IncredibleEmployment.be  This test is not yet approved or cleared by the [place] FDA and has been authorized for detection and/or diagnosis of SARS-CoV-2 by  FDA under an Emergency Use Authorization (EUA). This EUA will remain in effect (meaning this test can be used) for the duration of the COVID-19 declaration under Section 564(b)(1) of the Act, 21 U.S.C. section 360bbb-3(b)(1), unless the authorization is terminated or revoked.  Performed at [facility], [place] [address] [place], [place] [address]   MRSA PCR Screening     Status: None   Collection Time: 09/17/20  1:49 AM   Specimen: Nasal Mucosa; Nasopharyngeal  Result Value Ref Range Status   MRSA  by PCR NEGATIVE NEGATIVE Final    Comment:        The GeneXpert MRSA Assay (FDA approved for NASAL specimens only), is one component of a comprehensive MRSA colonization surveillance program. It is not intended to diagnose MRSA infection nor to guide or monitor treatment for MRSA infections. Performed at [facility], [place] [address] [place], [place] [address]          Radiology Studies: DG CHEST PORT 1 VIEW  Result Date: 09/19/2020 CLINICAL DATA:  Wrist for aspiration. EXAM: PORTABLE CHEST 1 VIEW COMPARISON:  09/17/2020. FINDINGS: Endotracheal tube and feeding tube have been removed. Borderline cardiomegaly with pulmonary venous congestion. Mild bilateral interstitial prominence. Mild component CHF cannot be excluded. Pneumonitis cannot be excluded. Low lung volumes with mild left mid lung subsegmental atelectasis. IMPRESSION: 1. Interval removal of endotracheal tube and feeding tube. 2. Borderline cardiomegaly with pulmonary venous congestion. Mild bilateral interstitial prominence. A mild component of CHF cannot be excluded. Pneumonitis cannot be excluded. 3. Cm.  Mild left mid lung field subsegmental atelectasis. Electronically Signed   By: [place]   On: 09/19/2020 05:29      Scheduled Meds: . amLODipine  5 mg Oral Daily  . aspirin  81 mg Oral Daily  . Chlorhexidine Gluconate Cloth  6 each Topical Daily  . citalopram  20 mg Oral Daily  . heparin  5,000 Units Subcutaneous Q8H  . insulin aspart  0-9 Units Subcutaneous Q4H  . multivitamin with minerals  1 tablet Oral Daily  . pantoprazole  40 mg Oral Daily  . phenytoin (DILANTIN) IV  100 mg Intravenous Q8H  . tamsulosin  0.4 mg Oral Daily   Continuous Infusions: . dextrose 5 % and 0.45% NaCl 50 mL/hr at 09/19/20 1600  . levETIRAcetam 500 mg (09/19/20 2126)     LOS: 4 days      Debbe Odea, MD [facility] Pager: [email or web address] 09/20/2020, 7:41 AM

## 2020-09-20 NOTE — Progress Notes (Signed)
Patient's CBG was 55. Patient was given apple juice, will recheck CBG in 15 min.

## 2020-09-20 NOTE — Progress Notes (Signed)
  Speech Language Pathology Treatment: Dysphagia  Patient Details Name: Andres White MRN: [id number] DOB: [birth year] Today's Date: 09/20/2020 Time: 5053-9767 SLP Time Calculation (min) (ACUTE ONLY): 17 min  Assessment / Plan / Recommendation Clinical Impression  No overt s/s of aspiration were noted during all PO trials, even when challenged with the [facility] Screen with thin liquids. Prolonged mastication was noted with solids; however, still appeared functional. Mild oral residue was also noted with solids which was alleviated with liquid wash. Pt benefited from [place] verbal cues to make him more arousable during PO trials especially with upgraded trials of thin liquids. Recommend DYS 3 diet and thin liquids - SLP will continue to f/u to assess safety with current diet.    HPI HPI: Pt is a 75 y.o. male who arrived as a code stroke with seizure activity upon arrival. Pt intubated from 09/16/20 to 09/18/20 (self-extubated). CT Head from 2/13 showed potential acute low-density in the left cerebellar peduncle that could  indicate acute infarction in that region. CXR from 2/14 showed equivocal left pleural effusion mild bibasilar atelectasis; no edema or airspace opacities noted. PMH includes syncope, CVA, HTN. Pt was seen by SLP after CVA in 2021, initially on Dys 2 diet to facilitate oral prep in light of mentation and no dentition, but advanced to Dys 3 before discharge.      SLP Plan  Continue with current plan of care       Recommendations  Diet recommendations: Dysphagia 3 (mechanical soft);Thin liquid Liquids provided via: Cup;Straw Medication Administration: Whole meds with puree Supervision: Staff to assist with self feeding;Full supervision/cueing for compensatory strategies Compensations: Minimize environmental distractions;Slow rate;Small sips/bites Postural Changes and/or Swallow Maneuvers: Seated upright 90 degrees                Oral Care Recommendations: Oral care  BID Follow up Recommendations: Skilled Nursing facility;24 hour supervision/assistance SLP Visit Diagnosis: Dysphagia, unspecified (R13.10) Plan: Continue with current plan of care       GO               Jeanine Luz., SLP Student 09/20/2020, 11:46 AM

## 2020-09-20 NOTE — TOC Progression Note (Signed)
Transition of Care [facility]) - Progression Note    Patient Details  Name: Andres White MRN: [id number] Date of Birth: [birth year]  Transition of Care [facility]) CM/SW [place], [place] Phone Number: 09/20/2020, 1:24 PM  Clinical Narrative:   CSW met with patient's daughter, [name], to discuss bed offers. [name] indicated interest in [facility] and [facility], which are still pending. CSW asked them to review. CSW to follow.    Expected Discharge Plan: [place] Barriers to Discharge: Continued Medical Work up,SNF Pending bed offer  Expected Discharge Plan and Services Expected Discharge Plan: [place] In-house Referral: Clinical Social Work   Post Acute Care Choice: [place] Living arrangements for the past 2 months: Single Family Home                                       Social Determinants of Health (SDOH) Interventions    Readmission Risk Interventions No flowsheet data found.

## 2020-09-20 NOTE — Care Management Important Message (Signed)
Important Message  Patient Details  Name: Andres White MRN: [id number] Date of Birth: 05-May-1946   Medicare Important Message Given:  Yes     [name] [address] 09/20/2020, 3:07 PM

## 2020-09-21 DIAGNOSIS — I6522 Occlusion and stenosis of left carotid artery: Secondary | ICD-10-CM | POA: Diagnosis not present

## 2020-09-21 DIAGNOSIS — G40901 Epilepsy, unspecified, not intractable, with status epilepticus: Secondary | ICD-10-CM | POA: Diagnosis not present

## 2020-09-21 LAB — GLUCOSE, CAPILLARY
Glucose-Capillary: 103 mg/dL — ABNORMAL HIGH (ref 70–99)
Glucose-Capillary: 108 mg/dL — ABNORMAL HIGH (ref 70–99)
Glucose-Capillary: 116 mg/dL — ABNORMAL HIGH (ref 70–99)
Glucose-Capillary: 126 mg/dL — ABNORMAL HIGH (ref 70–99)
Glucose-Capillary: 90 mg/dL (ref 70–99)

## 2020-09-21 MED ORDER — LEVETIRACETAM 500 MG PO TABS
500.0000 mg | ORAL_TABLET | Freq: Two times a day (BID) | ORAL | Status: DC
  Administered 2020-09-21 – 2020-09-25 (×8): 500 mg via ORAL
  Filled 2020-09-21 (×8): qty 1

## 2020-09-21 MED ORDER — PHENYTOIN SODIUM EXTENDED 100 MG PO CAPS
100.0000 mg | ORAL_CAPSULE | Freq: Three times a day (TID) | ORAL | Status: DC
  Administered 2020-09-21 – 2020-09-25 (×13): 100 mg via ORAL
  Filled 2020-09-21 (×13): qty 1

## 2020-09-21 NOTE — TOC Progression Note (Signed)
Transition of Care (TOC) - Progression Note    Patient Details  Name: Andres White MRN: [id number] Date of Birth: 09/01/1945  Transition of Care (TOC) CM/SW Contact   M , LCSW Phone Number: 09/21/2020, 1:10 PM  Clinical Narrative:   CSW following for discharge to SNF. [facility] has offered a bed for the patient, but [facility] is still reviewing. They will need to review referral after patient is out of restraints for 24 hours, won't consider while he's still in restraints. CSW spoke with daughter Heather to provide update, and she would really like [facility] if they'll offer a bed. Patient continues with restraints today and received Ativan last night for behaviors, so not stable for SNF at this time. CSW to follow.    Expected Discharge Plan: Skilled Nursing Facility Barriers to Discharge: Continued Medical Work up,Insurance Authorization,Facility will not accept until restraint criteria met  Expected Discharge Plan and Services Expected Discharge Plan: Skilled Nursing Facility In-house Referral: Clinical Social Work   Post Acute Care Choice: Skilled Nursing Facility Living arrangements for the past 2 months: Single Family Home                                       Social Determinants of Health (SDOH) Interventions    Readmission Risk Interventions No flowsheet data found.  

## 2020-09-21 NOTE — Progress Notes (Signed)
PROGRESS NOTE    Andres White   ZGY:[id number]  DOB: 02-Jul-1946  DOA: 09/16/2020 PCP: [name], MD   Brief Narrative:  Andres White is a 75 year old male with hypertension, gout and a history of CVA in 12/21 who presented to the hospital for seizure-like activity at home.  He had further seizures while in the ED and was intubated for airway protection while in a post ictal state.  He was loaded with Keppra and and Dilantin and admitted to the ICU. On 2/16, the patient self extubated. He is transferred to the triad hospitalist service on 2/17.  Subjective: Has no complaints.  The breakfast tray is sitting beside him and he states he does not feel like eating.  Thinks he is in the hospital for a stroke.    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 -Loaded with Keppra and Dilantin in the ED -Being continued on Keppra twice daily and Dilantin 3 times daily -Multiple EEGs have been performed and no seizure-like activity or epileptogenic focus found -MRI of the brain shows multiple chronic infarcts and mild to moderate atrophy of the brain  Active Problems:  Intracranial artery stenosis/  prior CVA of the brainstem in 12/21 - Per CTA, there is 70% stenosis of the left proximal ICA and 90% stenosis of left vertebral artery  - cont aspirin, statin and risk factor modification for stroke prevention  Loss of appetite -I have encouraged the patient to eat his meals -We will follow documentation of oral intake  Hypertension -The patient is on metoprolol as outpatient which is currently on hold and is receiving amlodipine in the hospital  GERD -Continue Protonix  BPH -Continue Flomax  H/o Gout - on daily Colchicine- can continue this    Time spent in minutes: 35 DVT prophylaxis: heparin injection 5,000 Units Start: 09/17/20 1400 SCDs Start: 09/16/20 2315  Code Status: Full code Family Communication:   Level of Care: Level of care: Telemetry Medical Disposition Plan:  Status is: Inpatient  Remains inpatient appropriate because:Inpatient level of care appropriate due to severity of illness   Dispo: The patient is from: Home              Anticipated d/c is to: SNF              Anticipated d/c date is: 2 days              Patient currently is not medically stable to d/c.   Difficult to place patient No      Consultants:   Neurology  PCCM Procedures:   EEGs Antimicrobials:  Anti-infectives (From admission, onward)   Start     Dose/Rate Route Frequency Ordered Stop   09/16/20 2330  piperacillin-tazobactam (ZOSYN) IVPB 3.375 g  Status:  Discontinued        3.375 g 12.5 mL/hr over 240 Minutes Intravenous Every 8 hours 09/16/20 2325 09/17/20 1110       Objective: Vitals:   09/21/20 0342 09/21/20 0500 09/21/20 0756 09/21/20 1018  BP: 127/76  113/67 (!) 161/78  Pulse: 87  74   Resp: 17  18   Temp: 98.4 F (36.9 C)  97.9 F (36.6 C)   TempSrc: Oral  Oral   SpO2:   100%   Weight:  81.7 kg    Height:        Intake/Output Summary (Last 24 hours) at 09/21/2020 1359 Last data filed at 09/21/2020 [id number]  Gross per 24 hour  Intake -  Output 1000 ml  Net -1000 ml   Filed Weights   09/16/20 2104 09/20/20 0500 09/21/20 0500  Weight: 82.7 kg 82.6 kg 81.7 kg    Examination: General exam: Appears comfortable  HEENT: PERRLA, oral mucosa moist, no sclera icterus or thrush Respiratory system: Clear to auscultation. Respiratory effort normal. Cardiovascular system: S1 & S2 heard, regular rate and rhythm Gastrointestinal system: Abdomen soft, non-tender, nondistended. Normal bowel sounds   Central nervous system: Alert and oriented. No focal neurological deficits. Extremities: No cyanosis, clubbing or edema Skin: No rashes or ulcers Psychiatry:  Mood & affect appropriate.    Data Reviewed: I have personally reviewed following labs and imaging studies  CBC: Recent Labs   Lab 09/16/20 2147 09/16/20 2157 09/17/20 0431 09/17/20 1107 09/18/20 0616 09/19/20 0216 09/20/20 0713  WBC 9.4  --   --  7.8 9.7 11.4* 7.4  NEUTROABS 4.1  --   --   --   --   --   --   HGB 11.2*   < > 10.9* 11.1* 11.0* 10.1* 10.8*  HCT 38.5*   < > 32.0* 36.9* 35.6* 32.7* 34.5*  MCV 98.2  --   --  92.3 90.6 89.6 88.0  PLT 236  --   --  221 220 166 215   < > = values in this interval not displayed.   Basic Metabolic Panel: Recent Labs  Lab 09/17/20 0556 09/17/20 1107 09/17/20 1914 09/18/20 0616 09/18/20 1801 09/19/20 0216 09/19/20 0442 09/20/20 0713  NA 142  --   --  138 139  --  139 137  K 3.0* 3.0*  --  3.3* 3.6  --  3.7 3.2*  CL 108  --   --  105 107  --  107 103  CO2 22  --   --  21* 21*  --  20* 21*  GLUCOSE 121*  --   --  124* 105*  --  91 105*  BUN 8  --   --  8 9  --  7* 5*  CREATININE 1.34*  --   --  1.33* 1.14  --  0.96 0.88  CALCIUM 8.8*  --   --  8.6* 8.7*  --  8.9 9.1  MG 1.9 1.9 2.0 2.2 2.1 QUANTITY NOT SUFFICIENT, UNABLE TO PERFORM TEST 1.8 1.7  PHOS  --  2.7 3.3 3.4 2.7  --   --  3.4   GFR: Estimated Creatinine Clearance: 71.9 mL/min (by C-G formula based on SCr of 0.88 mg/dL). Liver Function Tests: Recent Labs  Lab 09/16/20 2147 09/19/20 0442  AST 31 34  ALT 14 14  ALKPHOS 78 70  BILITOT 0.7 0.4  PROT 7.1 7.1  ALBUMIN 3.1* 2.9*   No results for input(s): LIPASE, AMYLASE in the last 168 hours. No results for input(s): AMMONIA in the last 168 hours. Coagulation Profile: Recent Labs  Lab 09/16/20 2147  INR 1.3*   Cardiac Enzymes: No results for input(s): CKTOTAL, CKMB, CKMBINDEX, TROPONINI in the last 168 hours. BNP (last 3 results) No results for input(s): PROBNP in the last 8760 hours. HbA1C: No results for input(s): HGBA1C in the last 72 hours. CBG: Recent Labs  Lab 09/20/20 1724 09/20/20 1956 09/20/20 2332 09/21/20 0343 09/21/20 0756  GLUCAP 114* 131* 102* 103* 90   Lipid Profile: No results for input(s): CHOL, HDL,  LDLCALC, TRIG, CHOLHDL, LDLDIRECT in the last 72 hours. Thyroid Function Tests: No results for input(s):  TSH, T4TOTAL, FREET4, T3FREE, THYROIDAB in the last 72 hours. Anemia Panel: No results for input(s): VITAMINB12, FOLATE, FERRITIN, TIBC, IRON, RETICCTPCT in the last 72 hours. Urine analysis:    Component Value Date/Time   COLORURINE COLORLESS (A) 09/17/2020 0212   APPEARANCEUR CLEAR 09/17/2020 0212   APPEARANCEUR Clear 03/23/2014 2009   LABSPEC 1.009 09/17/2020 0212   LABSPEC 1.010 03/23/2014 2009   PHURINE 7.0 09/17/2020 0212   GLUCOSEU NEGATIVE 09/17/2020 0212   GLUCOSEU Negative 03/23/2014 2009   HGBUR LARGE (A) 09/17/2020 0212   BILIRUBINUR NEGATIVE 09/17/2020 0212   BILIRUBINUR Negative 03/23/2014 2009   KETONESUR 5 (A) 09/17/2020 0212   PROTEINUR NEGATIVE 09/17/2020 0212   NITRITE NEGATIVE 09/17/2020 0212   LEUKOCYTESUR NEGATIVE 09/17/2020 0212   LEUKOCYTESUR Negative 03/23/2014 2009   Sepsis Labs: @LABRCNTIP (procalcitonin:4,lacticidven:4) ) Recent Results (from the past 240 hour(s))  Resp Panel by RT-PCR (Flu A&B, Covid)     Status: None   Collection Time: 09/16/20  9:29 PM   Specimen: Nasopharyngeal(NP) swabs in vial transport medium  Result Value Ref Range Status   SARS Coronavirus 2 by RT PCR NEGATIVE NEGATIVE Final    Comment: (NOTE) SARS-CoV-2 target nucleic acids are NOT DETECTED.  The SARS-CoV-2 RNA is generally detectable in upper respiratory specimens during the acute phase of infection. The lowest concentration of SARS-CoV-2 viral copies this assay can detect is 138 copies/mL. A negative result does not preclude SARS-Cov-2 infection and should not be used as the sole basis for treatment or other patient management decisions. A negative result may occur with  improper specimen collection/handling, submission of specimen other than nasopharyngeal swab, presence of viral mutation(s) within the areas targeted by this assay, and inadequate number of  viral copies(<138 copies/mL). A negative result must be combined with clinical observations, patient history, and epidemiological information. The expected result is Negative.  Fact Sheet for Patients:  EntrepreneurPulse.com.au  Fact Sheet for Healthcare Providers:  IncredibleEmployment.be  This test is no t yet approved or cleared by the [place] FDA and  has been authorized for detection and/or diagnosis of SARS-CoV-2 by FDA under an Emergency Use Authorization (EUA). This EUA will remain  in effect (meaning this test can be used) for the duration of the COVID-19 declaration under Section 564(b)(1) of the Act, 21 U.S.C.section 360bbb-3(b)(1), unless the authorization is terminated  or revoked sooner.       Influenza A by PCR NEGATIVE NEGATIVE Final   Influenza B by PCR NEGATIVE NEGATIVE Final    Comment: (NOTE) The Xpert Xpress SARS-CoV-2/FLU/RSV plus assay is intended as an aid in the diagnosis of influenza from Nasopharyngeal swab specimens and should not be used as a sole basis for treatment. Nasal washings and aspirates are unacceptable for Xpert Xpress SARS-CoV-2/FLU/RSV testing.  Fact Sheet for Patients: EntrepreneurPulse.com.au  Fact Sheet for Healthcare Providers: IncredibleEmployment.be  This test is not yet approved or cleared by the [place] FDA and has been authorized for detection and/or diagnosis of SARS-CoV-2 by FDA under an Emergency Use Authorization (EUA). This EUA will remain in effect (meaning this test can be used) for the duration of the COVID-19 declaration under Section 564(b)(1) of the Act, 21 U.S.C. section 360bbb-3(b)(1), unless the authorization is terminated or revoked.  Performed at [facility], [place] [address], [place], [place] [address]   MRSA PCR Screening     Status: None   Collection Time: 09/17/20  1:49 AM   Specimen: Nasal Mucosa; Nasopharyngeal   Result Value Ref Range Status  MRSA by PCR NEGATIVE NEGATIVE Final    Comment:        The GeneXpert MRSA Assay (FDA approved for NASAL specimens only), is one component of a comprehensive MRSA colonization surveillance program. It is not intended to diagnose MRSA infection nor to guide or monitor treatment for MRSA infections. Performed at [facility], [place] [address] [place], [place] [address]          Radiology Studies: MR BRAIN WO CONTRAST  Result Date: 09/20/2020 CLINICAL DATA:  Seizure, abnormal neuro exam. EXAM: MRI HEAD WITHOUT CONTRAST TECHNIQUE: Multiplanar, multiecho pulse sequences of the brain and surrounding structures were obtained without intravenous contrast. COMPARISON:  CT angiogram head/neck 09/16/2020. Head CT 09/16/2020. Brain MRI 06/24/2020. FINDINGS: Brain: The examination is intermittently motion degraded, limiting evaluation. Most notably, there is moderate motion degradation of the sagittal T1 weighted sequence, mild motion degradation of the axial T1 weighted sequence, mild-to-moderate motion degradation of the coronal T2 weighted sequence oriented perpendicular to the long axis of the hippocampi and mild motion degradation of the coronal T2 weighted sequence oriented perpendicular to the long axis of the hippocampi. Mild-to-moderate cerebral and cerebellar atrophy. Redemonstrated chronic cortically based infarcts within the left frontal and parietal lobes as well as right temporoparietal lobes. Redemonstrated small chronic infarcts within the left cerebral white matter, right caudate head, bilateral thalami and bilateral cerebellar hemispheres. Background moderate multifocal T2/FLAIR hyperintensity within the cerebral white matter is nonspecific, but compatible with chronic small vessel ischemic disease. Within the limitations of motion degradation, the hippocampi appear symmetric in size and signal. There is no acute infarct. No evidence of intracranial  mass. No chronic intracranial blood products. No extra-axial fluid collection. No midline shift. Vascular: Expected proximal arterial flow voids. Skull and upper cervical spine: No focal marrow lesion is identified. Sinuses/Orbits: Visualized orbits show no acute finding. Redemonstrated chronic deformity of the right lamina papyracea. Trace ethmoid sinus mucosal thickening. IMPRESSION: Motion degraded exam, as described. No evidence of acute intracranial abnormality. Redemonstrated small chronic cortically based infarcts within the left frontal and parietal lobes, as well as right temporoparietal lobes. Redemonstrated chronic infarcts within the left cerebral white matter, right basal ganglia, bilateral thalami and bilateral cerebellar hemispheres. Stable mild-to-moderate generalized atrophy of the brain and background moderate cerebral white matter chronic small vessel ischemic disease. Mild ethmoid sinus mucosal thickening. Electronically Signed   By: Kellie Simmering DO   On: 09/20/2020 11:21      Scheduled Meds: . amLODipine  5 mg Oral Daily  . aspirin  81 mg Oral Daily  . Chlorhexidine Gluconate Cloth  6 each Topical Daily  . citalopram  20 mg Oral Daily  . colchicine  0.6 mg Oral Daily  . heparin  5,000 Units Subcutaneous Q8H  . multivitamin with minerals  1 tablet Oral Daily  . pantoprazole  40 mg Oral Daily  . phenytoin (DILANTIN) IV  100 mg Intravenous Q8H  . tamsulosin  0.4 mg Oral Daily   Continuous Infusions: . levETIRAcetam 500 mg (09/21/20 1044)     LOS: 5 days      Debbe Odea, MD [facility] Pager: [email or web address] 09/21/2020, 1:59 PM

## 2020-09-21 NOTE — Progress Notes (Signed)
Neurology Progress Note  S: Asking to go home. States he feels well.   O: Current vital signs: BP (!) 161/78   Pulse 74   Temp 97.9 F (36.6 C) (Oral)   Resp 18   Ht 5\' 7"  (1.702 m)   Wt 81.7 kg   SpO2 100%   BMI 28.21 kg/m  Vital signs in last 24 hours: Temp:  [97.5 F (36.4 C)-98.7 F (37.1 C)] 97.9 F (36.6 C) (02/18 0756) Pulse Rate:  [74-88] 74 (02/18 0756) Resp:  [16-18] 18 (02/18 0756) BP: (113-161)/(67-86) 161/78 (02/18 1018) SpO2:  [98 %-100 %] 100 % (02/18 0756) Weight:  [81.7 kg] 81.7 kg (02/18 0500)  GENERAL: Awake, alert in NAD HEENT: Normocephalic and atraumatic, dry mm LUNGS: Normal respiratory effort.  CV: RRR  Ext: warm   NEURO:  Mental Status: AA&O to self and day of week. Not oriented to place, month, or date. States he is at home.  Speech/Language: speech is without dysarthria.  Naming, fluency, and comprehension intact but slowed. Does not repeat phrases.  Patient is unable to give clear history and needs coaching to follow some commands.   Cranial Nerves:  II: PERRL. Visual fields full. Tracks NP. III, IV, VI: EOMI. Eyelids elevate symmetrically.  V: Sensation is intact to light touch and symmetrical to face.  VII: Smile and face is symmetrical.  VIII: hearing intact to voice. IX, X: Palate elevates symmetrically. Phonation is normal.  XI: Shoulder shrug 5/5. XII: tongue is midline without fasciculations. Motor: Right UE and LE are 4/5. Left UE and LE are 5/5.  Tone: is normal and bulk is normal Sensation- Intact to light touch bilaterally. He has to be coached in distinguishing right and left, and after that, he gets right, left, and both correct.    Coordination: FTN intact bilaterally. Does not understand how to perform HKS.  He requires coaching for extinction exam. After coaching, he has no problem with right, left, or both testing. No drift.  Gait- deferred  Medications  Current Facility-Administered Medications:  .  albuterol  (PROVENTIL) (2.5 MG/3ML) 0.083% nebulizer solution 2.5 mg, 2.5 mg, Nebulization, Q4H PRN, Renee Pain, MD .  amLODipine (NORVASC) tablet 5 mg, 5 mg, Oral, Daily, Elie Confer, Sarah F, NP, 5 mg at 09/21/20 1018 .  aspirin chewable tablet 81 mg, 81 mg, Oral, Daily, Magdalen Spatz, NP, 81 mg at 09/21/20 1018 .  Chlorhexidine Gluconate Cloth 2 % PADS 6 each, 6 each, Topical, Daily, Renee Pain, MD, 6 each at 09/20/20 1116 .  citalopram (CELEXA) tablet 20 mg, 20 mg, Oral, Daily, Corey Harold, NP, 20 mg at 09/21/20 1018 .  colchicine tablet 0.6 mg, 0.6 mg, Oral, Daily, Rizwan, Saima, MD, 0.6 mg at 09/21/20 1018 .  docusate (COLACE) 50 MG/5ML liquid 100 mg, 100 mg, Oral, Daily PRN, Magdalen Spatz, NP .  heparin injection 5,000 Units, 5,000 Units, Subcutaneous, Q8H, Renee Pain, MD, 5,000 Units at 09/21/20 0548 .  hydrALAZINE (APRESOLINE) injection 10 mg, 10 mg, Intravenous, Q4H PRN, Jennelle Human B, NP, 10 mg at 09/19/20 0751 .  levETIRAcetam (KEPPRA) IVPB 500 mg/100 mL premix, 500 mg, Intravenous, Q12H, Amie Portland, MD, Last Rate: 400 mL/hr at 09/20/20 2358, 500 mg at 09/20/20 2358 .  multivitamin with minerals tablet 1 tablet, 1 tablet, Oral, Daily, Magdalen Spatz, NP, 1 tablet at 09/21/20 1018 .  pantoprazole (PROTONIX) EC tablet 40 mg, 40 mg, Oral, Daily, Renee Pain, MD, 40 mg at 09/21/20 1018 .  phenytoin (  DILANTIN) injection 100 mg, 100 mg, Intravenous, Q8H, Amie Portland, MD, 100 mg at 09/21/20 0549 .  polyethylene glycol (MIRALAX / GLYCOLAX) packet 17 g, 17 g, Oral, Daily PRN, Magdalen Spatz, NP .  [employer], , Oral, PRN, Renee Pain, MD .  tamsulosin [facility]) capsule 0.4 mg, 0.4 mg, Oral, Daily, Renee Pain, MD, 0.4 mg at 09/21/20 1018  Pertinent Labs  Glucose 90     A1c 4.5    LDL 19    TSH 3.1 on 08/30/20  Imaging MD has reviewed images in epic and the results pertinent to this consultation are:  MRI Brain  Motion degraded exam, as  described. No evidence of acute intracranial abnormality. Redemonstrated small chronic cortically based infarcts within the left frontal and parietal lobes, as well as right temporoparietal lobes. Redemonstrated chronic infarcts within the left cerebral white matter, right basal ganglia, bilateral thalami and bilateral cerebellar hemispheres. Stable mild-to-moderate generalized atrophy of the brain and background moderate cerebral white matter chronic small vessel ischemic disease. Mild ethmoid sinus mucosal thickening.  CTA head and neck  1. Aortic atherosclerosis. 2. Calcified plaque at both carotid bifurcations and ICA bulbs. 20% stenosis of the proximal ICA on the right. 70% stenosis of the proximal ICA on the left. 3. 50% stenosis at the origin of the non dominant right vertebral artery. Extensive calcified plaque at the proximal dominant left vertebral artery with severe stenosis, possibly 90%. Both vessels are patent beyond the proximal stenoses to the basilar. 4. No intracranial large or medium vessel occlusion or correctable proximal stenosis. 5. Hazy density in the posterior right upper lobe that could indicate bronchopneumonia. 6. Advanced chronic cervical spondylosis with potential for significant canal stenosis particularly at C4-5 and C5-6.  Assessment: 75 yo male who presented 4 days ago as a code stroke due to AMS, unresponsive to voice, and some left sided weakness, and upward gaze. No acute stroke was noted. It was also noted that patient had stiffening movements and frothing from his mouth at home prior to 911 being called. He had seizure like activity at the ED bridge and was loaded with Keppra and fosphenytoin and maintenance doses started. Due to continued AMS, actually worsened, he was intubated. EEG showed slowing suggestive of moderate diffuse encephalopathy with right upper extremity tremor (event button was pushed) without noted concomitant EEG change.  However, patient definitely had clinically noted seizures of new onset.   Plan: 1. Continue AEDs on discharge. Keppra 500mg  q 12hrs and Dilantin 100mg  q 8hrs. Have patient f/up with out patient neurology.  2. Given no acute infarct on MRI brain, continue ASA 81mg  po qd.  3. Neurology will be available for questions.   Pt seen by Clance Boll, MSN, APN-BC/Nurse Practitioner/Neuro and later by MD. Note and plan to be edited as needed by MD.  Pager: [phone]  Attestation:  I saw this patient with the APP on 09/21/20, obtained pertinent aspects of the history, and performed relevant physical and neurological examination as documented. Also, I reviewed the available laboratory data and neuroimages, and other relevant tests/notes/procedures.  Patient alert, talking, answering questions and denying any acute problems. He's in a roll belt. No further seizure activity.   My examination findings include normal cranial nerves. 4/5 power on the right. Hypoactive reflexes throughout. No clear sensory asymmetry.   Impression: - Doing well s/p episode of status epilepticus. No clear new insult seen on brain MRI, and I suspect the seizures were provoked by his recent stroke. - he's  on 2 AEDs at this time (necessary to break the status) - recent stroke as likely provoking factor  Recommendations: - continue keppra (may switch to oral at same dose) - continue phenytoin at 100 mg TID. - outpatient stroke follow up. - continue ASA 81 mg/d  - no LDL indicated as LDL 19.  We will be available prn for further questions.   Thank you.  Perfecto Kingdom, MD

## 2020-09-21 NOTE — Progress Notes (Signed)
Physical Therapy Treatment Patient Details Name: Andres White MRN: [id number] DOB: 04/05/1946 Today's Date: 09/21/2020    History of Present Illness Pt is a 75 y.o. male with medical history significant of hypertension, arthritis, tobacco use x20 years, gout, previous CVAs (left midbrain, left frontal and parietal lobes, right parietotemporal lobe, bilateral thalamus, and bilateral cerebellum) presents to ED on 2/13 with seizures with L and upwards gaze deviation. x2 more seizures in ED. CTA head and neck shows calcified plaque at both carotid 5 locations with 20% stenosis of the proximal ICA on the right, 70% stenosis of the proximal ICA on the left. 50% stenosis of the origin of the nondominant right vertebral artery. CT initially shows no acute changes, repeat CT shows acute low-density area in L cerebellar peduncle which could indicate acute infarct. EEG shows moderate diffuse encephalopathy, ETT 2/13-2/15.    PT Comments    Pt is making progress as he was more alert today. Pt able to come to stand progressing from needing modA initially to only needing minA with subsequent reps. In addition, he was able to take lateral steps at EOB with min-modAx2 for safety and stability with a RW today. However, he does display R quads weakness as his R knee tends to buckle with stance phase, needing knee blocking to prevent LOB. Will continue to follow acutely. Current recommendations remain appropriate.    Follow Up Recommendations  SNF     Equipment Recommendations  Other (comment) (TBD)    Recommendations for Other Services       Precautions / Restrictions Precautions Precautions: Fall Precaution Comments: waist restraint, bilateral mitts, rectal tube Restrictions Weight Bearing Restrictions: No    Mobility  Bed Mobility Overal bed mobility: Needs Assistance Bed Mobility: Supine to Sit;Sit to Supine     Supine to sit: Min guard;HOB elevated Sit to supine: Min assist   General bed  mobility comments: Cued pt to sit up L EOB with extra time and min guard for safety. MinA to manage legs to return to supine.    Transfers Overall transfer level: Needs assistance Equipment used: Rolling walker (2 wheeled) Transfers: Sit to/from Stand Sit to Stand: Min assist;Mod assist;+2 safety/equipment         General transfer comment: Min guard from rehab tech and modA 1st rep then only minA 2nd and 3rd rep of sit to stand from EOB to RW. Cued pt for hand placement, fair carryover.  Ambulation/Gait Ambulation/Gait assistance: Min assist;Mod assist;+2 physical assistance;+2 safety/equipment Gait Distance (Feet): 6 Feet (lateral steps) Assistive device: Rolling walker (2 wheeled) Gait Pattern/deviations: Trunk flexed Gait velocity: reduced Gait velocity interpretation: <1.31 ft/sec, indicative of household ambulator General Gait Details: Progressed from lateral weight shifting > marching in place > several lateral steps L<>R with RW and min-modAx2 for stability and safety. Cued pt to decrease R lateral trunk flexion and improve R knee extension in stance, needing blocking majority of time. Limited in ambulating anteriorly by IV on non-movable pole.   Stairs             Wheelchair Mobility    Modified Rankin (Stroke Patients Only)       Balance Overall balance assessment: Needs assistance Sitting-balance support: No upper extremity supported;Feet supported Sitting balance-Leahy Scale: Fair Sitting balance - Comments: No UE support, min guard for safety.   Standing balance support: During functional activity;Bilateral upper extremity supported Standing balance-Leahy Scale: Poor Standing balance comment: reliant on external assist and UE support on RW.  Cognition Arousal/Alertness: Awake/alert Behavior During Therapy: Impulsive Overall Cognitive Status: No family/caregiver present to determine baseline cognitive  functioning Area of Impairment: Orientation;Attention;Memory;Following commands;Safety/judgement;Problem solving;Awareness                 Orientation Level: Disoriented to;Time;Situation;Person Current Attention Level: Sustained Memory: Decreased short-term memory;Decreased recall of precautions Following Commands: Follows one step commands with increased time;Follows one step commands consistently;Follows multi-step commands inconsistently Safety/Judgement: Decreased awareness of deficits;Decreased awareness of safety Awareness: Emergent Problem Solving: Difficulty sequencing;Requires verbal cues;Requires tactile cues;Decreased initiation;Slow processing General Comments: Disoriented to own birthday, current date, and situation. Pt with slow processing, needing repeated simple cues throughout for safe sequencing of tasks.      Exercises      General Comments        Pertinent Vitals/Pain Pain Assessment: Faces Faces Pain Scale: No hurt Pain Intervention(s): Monitored during session    Home Living                      Prior Function            PT Goals (current goals can now be found in the care plan section) Acute Rehab PT Goals Patient Stated Goal: to get better PT Goal Formulation: With patient Time For Goal Achievement: 10/03/20 Potential to Achieve Goals: Fair Progress towards PT goals: Progressing toward goals    Frequency    Min 3X/week      PT Plan Current plan remains appropriate    Co-evaluation              AM-PAC PT "6 Clicks" Mobility   Outcome Measure  Help needed turning from your back to your side while in a flat bed without using bedrails?: A Little Help needed moving from lying on your back to sitting on the side of a flat bed without using bedrails?: A Little Help needed moving to and from a bed to a chair (including a wheelchair)?: A Lot Help needed standing up from a chair using your arms (e.g., wheelchair or bedside  chair)?: A Lot Help needed to walk in hospital room?: A Lot Help needed climbing 3-5 steps with a railing? : Total 6 Click Score: 13    End of Session Equipment Utilized During Treatment: Gait belt Activity Tolerance: Patient tolerated treatment well Patient left: in bed;with call bell/phone within reach;with bed alarm set;with restraints reapplied   PT Visit Diagnosis: Other abnormalities of gait and mobility (R26.89);Difficulty in walking, not elsewhere classified (R26.2);Muscle weakness (generalized) (M62.81);Unsteadiness on feet (R26.81)     Time: 3009-2330 PT Time Calculation (min) (ACUTE ONLY): 20 min  Charges:  $Gait Training: 8-22 mins                     Moishe Spice, PT, DPT Acute Rehabilitation Services  Pager: [phone] Office: [place] 09/21/2020, 3:50 PM

## 2020-09-21 NOTE — Progress Notes (Signed)
  Speech Language Pathology Treatment: Dysphagia  Patient Details Name: Andres White MRN: [id number] DOB: 1945-08-18 Today's Date: 09/21/2020 Time: 4982-6415 SLP Time Calculation (min) (ACUTE ONLY): 12 min  Assessment / Plan / Recommendation Clinical Impression  No overt s/s of aspiration was noted with solids; however, pt continues to demonstrate prolonged mastication and mild oral residue at times. Pt's oral phase still appears [facility] especially given that he is edentulous. One cough was noted with thin liquids via straw but no other overt s/s of aspiration were noted with additional 8oz of thin liquids. Recommend continuation of DYS 3 diet and thin liquids given that pt states that this was his current diet PTA - SLP will s/o at this time.    HPI HPI: Pt is a 75 y.o. male who arrived as a code stroke with seizure activity upon arrival. Pt intubated from 09/16/20 to 09/18/20 (self-extubated). CT Head from 2/13 showed potential acute low-density in the left cerebellar peduncle that could  indicate acute infarction in that region. CXR from 2/14 showed equivocal left pleural effusion mild bibasilar atelectasis; no edema or airspace opacities noted. PMH includes syncope, CVA, HTN. Pt was seen by SLP after CVA in 2021, initially on Dys 2 diet to facilitate oral prep in light of mentation and no dentition, but advanced to Dys 3 before discharge.      SLP Plan  All goals met       Recommendations  Diet recommendations: Dysphagia 3 (mechanical soft);Thin liquid Liquids provided via: Cup;Straw Medication Administration: Whole meds with puree Supervision: Staff to assist with self feeding;Full supervision/cueing for compensatory strategies Compensations: Minimize environmental distractions;Slow rate;Small sips/bites Postural Changes and/or Swallow Maneuvers: Seated upright 90 degrees                Oral Care Recommendations: Oral care BID Follow up Recommendations: Skilled Nursing facility;24  hour supervision/assistance SLP Visit Diagnosis: Dysphagia, unspecified (R13.10) Plan: All goals met       GO                Jeanine Luz., SLP Student 09/21/2020, 2:39 PM

## 2020-09-22 DIAGNOSIS — I6522 Occlusion and stenosis of left carotid artery: Secondary | ICD-10-CM | POA: Diagnosis not present

## 2020-09-22 LAB — BASIC METABOLIC PANEL
Anion gap: 14 (ref 5–15)
BUN: 8 mg/dL (ref 8–23)
CO2: 19 mmol/L — ABNORMAL LOW (ref 22–32)
Calcium: 9 mg/dL (ref 8.9–10.3)
Chloride: 103 mmol/L (ref 98–111)
Creatinine, Ser: 1 mg/dL (ref 0.61–1.24)
GFR, Estimated: >60 mL/min (ref >60)
Glucose, Bld: 103 mg/dL — ABNORMAL HIGH (ref 70–99)
Potassium: 3.6 mmol/L (ref 3.5–5.1)
Sodium: 136 mmol/L (ref 135–145)

## 2020-09-22 LAB — GLUCOSE, CAPILLARY
Glucose-Capillary: 87 mg/dL (ref 70–99)
Glucose-Capillary: 119 mg/dL — ABNORMAL HIGH (ref 70–99)
Glucose-Capillary: 102 mg/dL — ABNORMAL HIGH (ref 70–99)
Glucose-Capillary: 114 mg/dL — ABNORMAL HIGH (ref 70–99)
Glucose-Capillary: 123 mg/dL — ABNORMAL HIGH (ref 70–99)

## 2020-09-22 MED ORDER — LORAZEPAM 2 MG/ML IJ SOLN
1.0000 mg | Freq: Once | INTRAMUSCULAR | Status: AC
  Administered 2020-09-22: 1 mg via INTRAVENOUS
  Filled 2020-09-22: qty 1

## 2020-09-22 NOTE — Progress Notes (Signed)
PROGRESS NOTE    Andres White   IWL:[id number]  DOB: 27-Sep-1945  DOA: 09/16/2020 PCP: [name], MD   Brief Narrative:  Andres White is a 75 year old male with hypertension, gout and a history of CVA in 12/21 who presented to the hospital for seizure-like activity at home.  He had further seizures while in the ED and was intubated for airway protection while in a post ictal state.  He was loaded with Keppra and and Dilantin and admitted to the ICU. On 2/16, the patient self extubated. He is transferred to the [facility] hospitalist service on 2/17.  Subjective: He has no complaints.    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 -Loaded with Keppra and Dilantin in the ED -Being continued on Keppra twice daily and Dilantin 3 times daily -Multiple EEGs have been performed and no seizure-like activity or epileptogenic focus found -MRI of the brain shows multiple chronic infarcts and mild to moderate atrophy of the brain  Active Problems:  Intracranial artery stenosis/  prior CVA of the brainstem in 12/21 - Per CTA, there is 70% stenosis of the left proximal ICA and 90% stenosis of left vertebral artery  - cont aspirin, statin and risk factor modification for stroke prevention  Loss of appetite -I have encouraged the patient to eat his meals -We will follow documentation of oral intake  Hypertension -The patient is on metoprolol as outpatient which is currently on hold and is receiving amlodipine in the hospital  GERD -Continue Protonix  BPH -Continue Flomax  H/o Gout - on daily Colchicine- can continue this    Time spent in minutes: 35 DVT prophylaxis: heparin injection 5,000 Units Start: 09/17/20 1400 SCDs Start: 09/16/20 2315  Code Status: Full code Family Communication:  Level of Care: Level of care: Telemetry Medical Disposition Plan:  Status is: Inpatient  Remains inpatient appropriate  because:Inpatient level of care appropriate due to severity of illness   Dispo: The patient is from: Home              Anticipated d/c is to: SNF              Anticipated d/c date is: 2 days              Patient currently is not medically stable to d/c.   Difficult to place patient No      Consultants:   Neurology  PCCM Procedures:   EEGs Antimicrobials:  Anti-infectives (From admission, onward)   Start     Dose/Rate Route Frequency Ordered Stop   09/16/20 2330  piperacillin-tazobactam (ZOSYN) IVPB 3.375 g  Status:  Discontinued        3.375 g 12.5 mL/hr over 240 Minutes Intravenous Every 8 hours 09/16/20 2325 09/17/20 1110       Objective: Vitals:   09/21/20 2352 09/22/20 0500 09/22/20 0525 09/22/20 0752  BP: (!) 148/75  (!) 163/86 138/88  Pulse: 91  90 85  Resp: 18  16 18   Temp: 99.1 F (37.3 C)  97.9 F (36.6 C) 98 F (36.7 C)  TempSrc: Oral  Oral   SpO2: 96%  94%   Weight:  79.6 kg    Height:        Intake/Output Summary (Last 24 hours) at 09/22/2020 1103 Last data filed at 09/21/2020 1843 Gross per 24 hour  Intake 250 ml  Output 350 ml  Net -100 ml   [employer]  09/20/20 0500 09/21/20 0500 09/22/20 0500  Weight: 82.6 kg 81.7 kg 79.6 kg    Examination: General exam: Appears comfortable  HEENT: PERRLA, oral mucosa moist, no sclera icterus or thrush Respiratory system: Clear to auscultation. Respiratory effort normal. Cardiovascular system: S1 & S2 heard, regular rate and rhythm Gastrointestinal system: Abdomen soft, non-tender, nondistended. Normal bowel sounds   Central nervous system: Alert and oriented. No focal neurological deficits. Extremities: No cyanosis, clubbing or edema Skin: No rashes or ulcers Psychiatry:  Mood & affect appropriate.    Data Reviewed: I have personally reviewed following labs and imaging studies  CBC: Recent Labs  Lab 09/16/20 2147 09/16/20 2157 09/17/20 0431 09/17/20 1107 09/18/20 0616 09/19/20 0216  09/20/20 0713  WBC 9.4  --   --  7.8 9.7 11.4* 7.4  NEUTROABS 4.1  --   --   --   --   --   --   HGB 11.2*   < > 10.9* 11.1* 11.0* 10.1* 10.8*  HCT 38.5*   < > 32.0* 36.9* 35.6* 32.7* 34.5*  MCV 98.2  --   --  92.3 90.6 89.6 88.0  PLT 236  --   --  221 220 166 215   < > = values in this interval not displayed.   Basic Metabolic Panel: Recent Labs  Lab 09/17/20 1107 09/17/20 1914 09/18/20 0616 09/18/20 1801 09/19/20 0216 09/19/20 0442 09/20/20 0713 09/22/20 0430  NA  --   --  138 139  --  139 137 136  K 3.0*  --  3.3* 3.6  --  3.7 3.2* 3.6  CL  --   --  105 107  --  107 103 103  CO2  --   --  21* 21*  --  20* 21* 19*  GLUCOSE  --   --  124* 105*  --  91 105* 103*  BUN  --   --  8 9  --  7* 5* 8  CREATININE  --   --  1.33* 1.14  --  0.96 0.88 1.00  CALCIUM  --   --  8.6* 8.7*  --  8.9 9.1 9.0  MG 1.9 2.0 2.2 2.1 QUANTITY NOT SUFFICIENT, UNABLE TO PERFORM TEST 1.8 1.7  --   PHOS 2.7 3.3 3.4 2.7  --   --  3.4  --    GFR: Estimated Creatinine Clearance: 62.6 mL/min (by C-G formula based on SCr of 1 mg/dL). Liver Function Tests: Recent Labs  Lab 09/16/20 2147 09/19/20 0442  AST 31 34  ALT 14 14  ALKPHOS 78 70  BILITOT 0.7 0.4  PROT 7.1 7.1  ALBUMIN 3.1* 2.9*   No results for input(s): LIPASE, AMYLASE in the last 168 hours. No results for input(s): AMMONIA in the last 168 hours. Coagulation Profile: Recent Labs  Lab 09/16/20 2147  INR 1.3*   Cardiac Enzymes: No results for input(s): CKTOTAL, CKMB, CKMBINDEX, TROPONINI in the last 168 hours. BNP (last 3 results) No results for input(s): PROBNP in the last 8760 hours. HbA1C: No results for input(s): HGBA1C in the last 72 hours. CBG: Recent Labs  Lab 09/21/20 1629 09/21/20 2036 09/21/20 2354 09/22/20 0521 09/22/20 0749  GLUCAP 116* 126* 108* 87 119*   Lipid Profile: No results for input(s): CHOL, HDL, LDLCALC, TRIG, CHOLHDL, LDLDIRECT in the last 72 hours. Thyroid Function Tests: No results for input(s):  TSH, T4TOTAL, FREET4, T3FREE, THYROIDAB in the last 72 hours. Anemia Panel: No results for input(s): VITAMINB12, FOLATE, FERRITIN,  TIBC, IRON, RETICCTPCT in the last 72 hours. Urine analysis:    Component Value Date/Time   COLORURINE COLORLESS (A) 09/17/2020 0212   APPEARANCEUR CLEAR 09/17/2020 0212   APPEARANCEUR Clear 03/23/2014 2009   LABSPEC 1.009 09/17/2020 0212   LABSPEC 1.010 03/23/2014 2009   PHURINE 7.0 09/17/2020 0212   GLUCOSEU NEGATIVE 09/17/2020 0212   GLUCOSEU Negative 03/23/2014 2009   HGBUR LARGE (A) 09/17/2020 0212   BILIRUBINUR NEGATIVE 09/17/2020 0212   BILIRUBINUR Negative 03/23/2014 2009   KETONESUR 5 (A) 09/17/2020 0212   PROTEINUR NEGATIVE 09/17/2020 0212   NITRITE NEGATIVE 09/17/2020 0212   LEUKOCYTESUR NEGATIVE 09/17/2020 0212   LEUKOCYTESUR Negative 03/23/2014 2009   Sepsis Labs: @LABRCNTIP (procalcitonin:4,lacticidven:4) ) Recent Results (from the past 240 hour(s))  Resp Panel by RT-PCR (Flu A&B, Covid)     Status: None   Collection Time: 09/16/20  9:29 PM   Specimen: Nasopharyngeal(NP) swabs in vial transport medium  Result Value Ref Range Status   SARS Coronavirus 2 by RT PCR NEGATIVE NEGATIVE Final    Comment: (NOTE) SARS-CoV-2 target nucleic acids are NOT DETECTED.  The SARS-CoV-2 RNA is generally detectable in upper respiratory specimens during the acute phase of infection. The lowest concentration of SARS-CoV-2 viral copies this assay can detect is 138 copies/mL. A negative result does not preclude SARS-Cov-2 infection and should not be used as the sole basis for treatment or other patient management decisions. A negative result may occur with  improper specimen collection/handling, submission of specimen other than nasopharyngeal swab, presence of viral mutation(s) within the areas targeted by this assay, and inadequate number of viral copies(<138 copies/mL). A negative result must be combined with clinical observations, patient history,  and epidemiological information. The expected result is Negative.  Fact Sheet for Patients:  EntrepreneurPulse.com.au  Fact Sheet for Healthcare Providers:  IncredibleEmployment.be  This test is no t yet approved or cleared by the [place] FDA and  has been authorized for detection and/or diagnosis of SARS-CoV-2 by FDA under an Emergency Use Authorization (EUA). This EUA will remain  in effect (meaning this test can be used) for the duration of the COVID-19 declaration under Section 564(b)(1) of the Act, 21 U.S.C.section 360bbb-3(b)(1), unless the authorization is terminated  or revoked sooner.       Influenza A by PCR NEGATIVE NEGATIVE Final   Influenza B by PCR NEGATIVE NEGATIVE Final    Comment: (NOTE) The Xpert Xpress SARS-CoV-2/FLU/RSV plus assay is intended as an aid in the diagnosis of influenza from Nasopharyngeal swab specimens and should not be used as a sole basis for treatment. Nasal washings and aspirates are unacceptable for Xpert Xpress SARS-CoV-2/FLU/RSV testing.  Fact Sheet for Patients: EntrepreneurPulse.com.au  Fact Sheet for Healthcare Providers: IncredibleEmployment.be  This test is not yet approved or cleared by the [place] FDA and has been authorized for detection and/or diagnosis of SARS-CoV-2 by FDA under an Emergency Use Authorization (EUA). This EUA will remain in effect (meaning this test can be used) for the duration of the COVID-19 declaration under Section 564(b)(1) of the Act, 21 U.S.C. section 360bbb-3(b)(1), unless the authorization is terminated or revoked.  Performed at [facility], [place] [address] [place], [place] [address]   MRSA PCR Screening     Status: None   Collection Time: 09/17/20  1:49 AM   Specimen: Nasal Mucosa; Nasopharyngeal  Result Value Ref Range Status   MRSA by PCR NEGATIVE NEGATIVE Final    Comment:        The  GeneXpert MRSA  Assay (FDA approved for NASAL specimens only), is one component of a comprehensive MRSA colonization surveillance program. It is not intended to diagnose MRSA infection nor to guide or monitor treatment for MRSA infections. Performed at [facility], [place] [address] [place], [place] [address]          Radiology Studies: No results found.    Scheduled Meds: . amLODipine  5 mg Oral Daily  . aspirin  81 mg Oral Daily  . Chlorhexidine Gluconate Cloth  6 each Topical Daily  . citalopram  20 mg Oral Daily  . colchicine  0.6 mg Oral Daily  . heparin  5,000 Units Subcutaneous Q8H  . levETIRAcetam  500 mg Oral BID  . multivitamin with minerals  1 tablet Oral Daily  . pantoprazole  40 mg Oral Daily  . phenytoin  100 mg Oral TID  . tamsulosin  0.4 mg Oral Daily   Continuous Infusions:    LOS: 6 days      Debbe Odea, MD [facility] Pager: [email or web address] 09/22/2020, 11:03 AM

## 2020-09-23 DIAGNOSIS — I6522 Occlusion and stenosis of left carotid artery: Secondary | ICD-10-CM | POA: Diagnosis not present

## 2020-09-23 LAB — GLUCOSE, CAPILLARY
Glucose-Capillary: 110 mg/dL — ABNORMAL HIGH (ref 70–99)
Glucose-Capillary: 113 mg/dL — ABNORMAL HIGH (ref 70–99)
Glucose-Capillary: 117 mg/dL — ABNORMAL HIGH (ref 70–99)
Glucose-Capillary: 135 mg/dL — ABNORMAL HIGH (ref 70–99)

## 2020-09-23 MED ORDER — METOPROLOL SUCCINATE ER 25 MG PO TB24
25.0000 mg | ORAL_TABLET | Freq: Every day | ORAL | Status: DC
  Administered 2020-09-23 – 2020-09-25 (×3): 25 mg via ORAL
  Filled 2020-09-23 (×3): qty 1

## 2020-09-23 NOTE — Progress Notes (Signed)
PROGRESS NOTE    Andres White   VPX:[id number]  DOB: 1946-05-22  DOA: 09/16/2020 PCP: [name], MD   Brief Narrative:  Andres White is a 75 year old male with hypertension, gout and a history of CVA in 12/21 who presented to the hospital for seizure-like activity at home.  He had further seizures while in the ED and was intubated for airway protection while in a post ictal state.  He was loaded with Keppra and and Dilantin and admitted to the ICU. On 2/16, the patient self extubated. He is transferred to the [facility]ad hospitalist service on 2/17.  Subjective: No complaints.    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 -Loaded with Keppra and Dilantin in the ED -Being continued on Keppra twice daily and Dilantin 3 times daily -Multiple EEGs have been performed and no seizure-like activity or epileptogenic focus found -MRI of the brain shows multiple chronic infarcts and mild to moderate atrophy of the brain  Active Problems:  Intracranial artery stenosis/  prior CVA of the brainstem in 12/21 - Per CTA, there is 70% stenosis of the left proximal ICA and 90% stenosis of left vertebral artery  - cont aspirin, statin and risk factor modification for stroke prevention  Loss of appetite -I have encouraged the patient to eat his meals -We will follow documentation of oral intake  Hypertension -The patient is on metoprolol as outpatient which is currently on hold and is receiving amlodipine in the hospital  GERD -Continue Protonix  BPH -Continue Flomax  H/o Gout - on daily Colchicine- can continue this    Time spent in minutes: 35 DVT prophylaxis: heparin injection 5,000 Units Start: 09/17/20 1400 SCDs Start: 09/16/20 2315  Code Status: Full code Family Communication:  Level of Care: Level of care: Telemetry Medical Disposition Plan:  Status is: Inpatient  Remains inpatient appropriate  because:Inpatient level of care appropriate due to severity of illness   Dispo: The patient is from: Home              Anticipated d/c is to: SNF              Anticipated d/c date is: 2 days              Patient currently is not medically stable to d/c.   Difficult to place patient No      Consultants:   Neurology  PCCM Procedures:   EEGs Antimicrobials:  Anti-infectives (From admission, onward)   Start     Dose/Rate Route Frequency Ordered Stop   09/16/20 2330  piperacillin-tazobactam (ZOSYN) IVPB 3.375 g  Status:  Discontinued        3.375 g 12.5 mL/hr over 240 Minutes Intravenous Every 8 hours 09/16/20 2325 09/17/20 1110       Objective: Vitals:   09/23/20 0346 09/23/20 0500 09/23/20 0726 09/23/20 1121  BP: (!) 163/93  (!) 167/84 (!) 187/104  Pulse: 88  79 82  Resp: 16  18 18   Temp: (!) 97.5 F (36.4 C)  98 F (36.7 C) 99.5 F (37.5 C)  TempSrc: Oral   Oral  SpO2: 100%   100%  Weight:  79.7 kg    Height:        Intake/Output Summary (Last 24 hours) at 09/23/2020 1353 Last data filed at 09/23/2020 0600 Gross per 24 hour  Intake 120 ml  Output 400 ml  Net -280 ml   [employer]  09/21/20 0500 09/22/20 0500 09/23/20 0500  Weight: 81.7 kg 79.6 kg 79.7 kg    Examination: General exam: Appears comfortable  HEENT: PERRLA, oral mucosa moist, no sclera icterus or thrush Respiratory system: Clear to auscultation. Respiratory effort normal. Cardiovascular system: S1 & S2 heard, regular rate and rhythm Gastrointestinal system: Abdomen soft, non-tender, nondistended. Normal bowel sounds   Central nervous system: Alert and oriented. No focal neurological deficits. Extremities: No cyanosis, clubbing or edema Skin: No rashes or ulcers Psychiatry:  Mood & affect appropriate.    Data Reviewed: I have personally reviewed following labs and imaging studies  CBC: Recent Labs  Lab 09/16/20 2147 09/16/20 2157 09/17/20 0431 09/17/20 1107 09/18/20 0616  09/19/20 0216 09/20/20 0713  WBC 9.4  --   --  7.8 9.7 11.4* 7.4  NEUTROABS 4.1  --   --   --   --   --   --   HGB 11.2*   < > 10.9* 11.1* 11.0* 10.1* 10.8*  HCT 38.5*   < > 32.0* 36.9* 35.6* 32.7* 34.5*  MCV 98.2  --   --  92.3 90.6 89.6 88.0  PLT 236  --   --  221 220 166 215   < > = values in this interval not displayed.   Basic Metabolic Panel: Recent Labs  Lab 09/17/20 1107 09/17/20 1914 09/18/20 0616 09/18/20 1801 09/19/20 0216 09/19/20 0442 09/20/20 0713 09/22/20 0430  NA  --   --  138 139  --  139 137 136  K 3.0*  --  3.3* 3.6  --  3.7 3.2* 3.6  CL  --   --  105 107  --  107 103 103  CO2  --   --  21* 21*  --  20* 21* 19*  GLUCOSE  --   --  124* 105*  --  91 105* 103*  BUN  --   --  8 9  --  7* 5* 8  CREATININE  --   --  1.33* 1.14  --  0.96 0.88 1.00  CALCIUM  --   --  8.6* 8.7*  --  8.9 9.1 9.0  MG 1.9 2.0 2.2 2.1 QUANTITY NOT SUFFICIENT, UNABLE TO PERFORM TEST 1.8 1.7  --   PHOS 2.7 3.3 3.4 2.7  --   --  3.4  --    GFR: Estimated Creatinine Clearance: 62.6 mL/min (by C-G formula based on SCr of 1 mg/dL). Liver Function Tests: Recent Labs  Lab 09/16/20 2147 09/19/20 0442  AST 31 34  ALT 14 14  ALKPHOS 78 70  BILITOT 0.7 0.4  PROT 7.1 7.1  ALBUMIN 3.1* 2.9*   No results for input(s): LIPASE, AMYLASE in the last 168 hours. No results for input(s): AMMONIA in the last 168 hours. Coagulation Profile: Recent Labs  Lab 09/16/20 2147  INR 1.3*   Cardiac Enzymes: No results for input(s): CKTOTAL, CKMB, CKMBINDEX, TROPONINI in the last 168 hours. BNP (last 3 results) No results for input(s): PROBNP in the last 8760 hours. HbA1C: No results for input(s): HGBA1C in the last 72 hours. CBG: Recent Labs  Lab 09/22/20 1130 09/22/20 1546 09/22/20 2025 09/23/20 0037 09/23/20 0347  GLUCAP 102* 114* 123* 117* 110*   Lipid Profile: No results for input(s): CHOL, HDL, LDLCALC, TRIG, CHOLHDL, LDLDIRECT in the last 72 hours. Thyroid Function Tests: No  results for input(s): TSH, T4TOTAL, FREET4, T3FREE, THYROIDAB in the last 72 hours. Anemia Panel: No results for input(s): VITAMINB12, FOLATE, FERRITIN,  TIBC, IRON, RETICCTPCT in the last 72 hours. Urine analysis:    Component Value Date/Time   COLORURINE COLORLESS (A) 09/17/2020 0212   APPEARANCEUR CLEAR 09/17/2020 0212   APPEARANCEUR Clear 03/23/2014 2009   LABSPEC 1.009 09/17/2020 0212   LABSPEC 1.010 03/23/2014 2009   PHURINE 7.0 09/17/2020 0212   GLUCOSEU NEGATIVE 09/17/2020 0212   GLUCOSEU Negative 03/23/2014 2009   HGBUR LARGE (A) 09/17/2020 0212   BILIRUBINUR NEGATIVE 09/17/2020 0212   BILIRUBINUR Negative 03/23/2014 2009   KETONESUR 5 (A) 09/17/2020 0212   PROTEINUR NEGATIVE 09/17/2020 0212   NITRITE NEGATIVE 09/17/2020 0212   LEUKOCYTESUR NEGATIVE 09/17/2020 0212   LEUKOCYTESUR Negative 03/23/2014 2009   Sepsis Labs: @LABRCNTIP (procalcitonin:4,lacticidven:4) ) Recent Results (from the past 240 hour(s))  Resp Panel by RT-PCR (Flu A&B, Covid)     Status: None   Collection Time: 09/16/20  9:29 PM   Specimen: Nasopharyngeal(NP) swabs in vial transport medium  Result Value Ref Range Status   SARS Coronavirus 2 by RT PCR NEGATIVE NEGATIVE Final    Comment: (NOTE) SARS-CoV-2 target nucleic acids are NOT DETECTED.  The SARS-CoV-2 RNA is generally detectable in upper respiratory specimens during the acute phase of infection. The lowest concentration of SARS-CoV-2 viral copies this assay can detect is 138 copies/mL. A negative result does not preclude SARS-Cov-2 infection and should not be used as the sole basis for treatment or other patient management decisions. A negative result may occur with  improper specimen collection/handling, submission of specimen other than nasopharyngeal swab, presence of viral mutation(s) within the areas targeted by this assay, and inadequate number of viral copies(<138 copies/mL). A negative result must be combined with clinical  observations, patient history, and epidemiological information. The expected result is Negative.  Fact Sheet for Patients:  EntrepreneurPulse.com.au  Fact Sheet for Healthcare Providers:  IncredibleEmployment.be  This test is no t yet approved or cleared by the [place] FDA and  has been authorized for detection and/or diagnosis of SARS-CoV-2 by FDA under an Emergency Use Authorization (EUA). This EUA will remain  in effect (meaning this test can be used) for the duration of the COVID-19 declaration under Section 564(b)(1) of the Act, 21 U.S.C.section 360bbb-3(b)(1), unless the authorization is terminated  or revoked sooner.       Influenza A by PCR NEGATIVE NEGATIVE Final   Influenza B by PCR NEGATIVE NEGATIVE Final    Comment: (NOTE) The Xpert Xpress SARS-CoV-2/FLU/RSV plus assay is intended as an aid in the diagnosis of influenza from Nasopharyngeal swab specimens and should not be used as a sole basis for treatment. Nasal washings and aspirates are unacceptable for Xpert Xpress SARS-CoV-2/FLU/RSV testing.  Fact Sheet for Patients: EntrepreneurPulse.com.au  Fact Sheet for Healthcare Providers: IncredibleEmployment.be  This test is not yet approved or cleared by the [place] FDA and has been authorized for detection and/or diagnosis of SARS-CoV-2 by FDA under an Emergency Use Authorization (EUA). This EUA will remain in effect (meaning this test can be used) for the duration of the COVID-19 declaration under Section 564(b)(1) of the Act, 21 U.S.C. section 360bbb-3(b)(1), unless the authorization is terminated or revoked.  Performed at [facility], [place] [address] [place], [place] [address]   MRSA PCR Screening     Status: None   Collection Time: 09/17/20  1:49 AM   Specimen: Nasal Mucosa; Nasopharyngeal  Result Value Ref Range Status   MRSA by PCR NEGATIVE NEGATIVE Final     Comment:  The GeneXpert MRSA Assay (FDA approved for NASAL specimens only), is one component of a comprehensive MRSA colonization surveillance program. It is not intended to diagnose MRSA infection nor to guide or monitor treatment for MRSA infections. Performed at [facility], [place] [address], [place], [place] [address]          Radiology Studies: No results found.    Scheduled Meds: . amLODipine  5 mg Oral Daily  . aspirin  81 mg Oral Daily  . Chlorhexidine Gluconate Cloth  6 each Topical Daily  . citalopram  20 mg Oral Daily  . colchicine  0.6 mg Oral Daily  . heparin  5,000 Units Subcutaneous Q8H  . levETIRAcetam  500 mg Oral BID  . metoprolol succinate  25 mg Oral Daily  . multivitamin with minerals  1 tablet Oral Daily  . pantoprazole  40 mg Oral Daily  . phenytoin  100 mg Oral TID  . tamsulosin  0.4 mg Oral Daily   Continuous Infusions:    LOS: 7 days      Debbe Odea, MD [facility] Pager: [email or web address] 09/23/2020, 1:53 PM

## 2020-09-24 DIAGNOSIS — I6522 Occlusion and stenosis of left carotid artery: Secondary | ICD-10-CM | POA: Diagnosis not present

## 2020-09-24 DIAGNOSIS — G40901 Epilepsy, unspecified, not intractable, with status epilepticus: Secondary | ICD-10-CM | POA: Diagnosis not present

## 2020-09-24 LAB — GLUCOSE, CAPILLARY
Glucose-Capillary: 114 mg/dL — ABNORMAL HIGH (ref 70–99)
Glucose-Capillary: 104 mg/dL — ABNORMAL HIGH (ref 70–99)
Glucose-Capillary: 176 mg/dL — ABNORMAL HIGH (ref 70–99)
Glucose-Capillary: 113 mg/dL — ABNORMAL HIGH (ref 70–99)
Glucose-Capillary: 164 mg/dL — ABNORMAL HIGH (ref 70–99)

## 2020-09-24 MED ORDER — QUETIAPINE FUMARATE 50 MG PO TABS
50.0000 mg | ORAL_TABLET | Freq: Every day | ORAL | Status: DC
  Administered 2020-09-24 – 2020-09-25 (×2): 50 mg via ORAL
  Filled 2020-09-24 (×2): qty 1

## 2020-09-24 NOTE — Progress Notes (Signed)
PROGRESS NOTE    Andres White   WER:[id number]  DOB: 06/14/1946  DOA: 09/16/2020 PCP: [name], MD   Brief Narrative:  Andres White is a 75 year old male with hypertension, gout and a history of CVA in 12/21 who presented to the hospital for seizure-like activity at home.  He had further seizures while in the ED and was intubated for airway protection while in a post ictal state.  He was loaded with Keppra and and Dilantin and admitted to the ICU. On 2/16, the patient self extubated. He is transferred to the triad hospitalist service on 2/17.  Subjective: No complaints.    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 -Loaded with Keppra and Dilantin in the ED -Being continued on Keppra twice daily and Dilantin 3 times daily -Multiple EEGs have been performed and no seizure-like activity or epileptogenic focus found -MRI of the brain shows multiple chronic infarcts and mild to moderate atrophy of the brain  Active Problems:  Intracranial artery stenosis/  prior CVA of the brainstem in 12/21 - Per CTA, there is 70% stenosis of the left proximal ICA and 90% stenosis of left vertebral artery  - cont aspirin, statin and risk factor modification for stroke prevention - awaiting SNF placement  Dementia with sundowning - start Seroquel at 8 PM tonight  Loss of appetite -I have encouraged the patient to eat his meals -We will follow documentation of oral intake  Hypertension -The patient is on metoprolol as outpatient which is currently on hold and is receiving amlodipine in the hospital  GERD -Continue Protonix  BPH -Continue Flomax  H/o Gout - on daily Colchicine- can continue this    Time spent in minutes: 35 DVT prophylaxis: heparin injection 5,000 Units Start: 09/17/20 1400 SCDs Start: 09/16/20 2315  Code Status: Full code Family Communication:  Level of Care: Level of care: Telemetry  Medical Disposition Plan:  Status is: Inpatient  Remains inpatient appropriate because:Inpatient level of care appropriate due to severity of illness   Dispo: The patient is from: Home              Anticipated d/c is to: SNF              Anticipated d/c date is: 2 days              Patient currently is not medically stable to d/c.   Difficult to place patient No      Consultants:   Neurology  PCCM Procedures:   EEGs Antimicrobials:  Anti-infectives (From admission, onward)   Start     Dose/Rate Route Frequency Ordered Stop   09/16/20 2330  piperacillin-tazobactam (ZOSYN) IVPB 3.375 g  Status:  Discontinued        3.375 g 12.5 mL/hr over 240 Minutes Intravenous Every 8 hours 09/16/20 2325 09/17/20 1110       Objective: Vitals:   09/24/20 0356 09/24/20 0500 09/24/20 0806 09/24/20 1147  BP: (!) 170/92  130/81 101/62  Pulse: 84  76 80  Resp: 14  18 17   Temp: 99.5 F (37.5 C)  99.2 F (37.3 C) 98.3 F (36.8 C)  TempSrc: Oral  Oral Oral  SpO2: 97%   100%  Weight:  80.7 kg    Height:        Intake/Output Summary (Last 24 hours) at 09/24/2020 1344 Last data filed at 09/24/2020 0900 Gross per 24 hour  Intake 117 ml  Output --  Net 117 ml   [employer]   09/22/20 0500 09/23/20 0500 09/24/20 0500  Weight: 79.6 kg 79.7 kg 80.7 kg    Examination: General exam: Appears comfortable  HEENT: PERRLA, oral mucosa moist, no sclera icterus or thrush Respiratory system: Clear to auscultation. Respiratory effort normal. Cardiovascular system: S1 & S2 heard, regular rate and rhythm Gastrointestinal system: Abdomen soft, non-tender, nondistended. Normal bowel sounds   Central nervous system: Alert and oriented to person and place. No focal neurological deficits. Extremities: No cyanosis, clubbing or edema Skin: No rashes or ulcers Psychiatry:  Mood & affect appropriate.    Data Reviewed: I have personally reviewed following labs and imaging studies  CBC: Recent  Labs  Lab 09/18/20 0616 09/19/20 0216 09/20/20 0713  WBC 9.7 11.4* 7.4  HGB 11.0* 10.1* 10.8*  HCT 35.6* 32.7* 34.5*  MCV 90.6 89.6 88.0  PLT 220 166 258   Basic Metabolic Panel: Recent Labs  Lab 09/17/20 1914 09/18/20 0616 09/18/20 1801 09/19/20 0216 09/19/20 0442 09/20/20 0713 09/22/20 0430  NA  --  138 139  --  139 137 136  K  --  3.3* 3.6  --  3.7 3.2* 3.6  CL  --  105 107  --  107 103 103  CO2  --  21* 21*  --  20* 21* 19*  GLUCOSE  --  124* 105*  --  91 105* 103*  BUN  --  8 9  --  7* 5* 8  CREATININE  --  1.33* 1.14  --  0.96 0.88 1.00  CALCIUM  --  8.6* 8.7*  --  8.9 9.1 9.0  MG 2.0 2.2 2.1 QUANTITY NOT SUFFICIENT, UNABLE TO PERFORM TEST 1.8 1.7  --   PHOS 3.3 3.4 2.7  --   --  3.4  --    GFR: Estimated Creatinine Clearance: 62.9 mL/min (by C-G formula based on SCr of 1 mg/dL). Liver Function Tests: Recent Labs  Lab 09/19/20 0442  AST 34  ALT 14  ALKPHOS 70  BILITOT 0.4  PROT 7.1  ALBUMIN 2.9*   No results for input(s): LIPASE, AMYLASE in the last 168 hours. No results for input(s): AMMONIA in the last 168 hours. Coagulation Profile: No results for input(s): INR, PROTIME in the last 168 hours. Cardiac Enzymes: No results for input(s): CKTOTAL, CKMB, CKMBINDEX, TROPONINI in the last 168 hours. BNP (last 3 results) No results for input(s): PROBNP in the last 8760 hours. HbA1C: No results for input(s): HGBA1C in the last 72 hours. CBG: Recent Labs  Lab 09/23/20 1939 09/23/20 2337 09/24/20 0358 09/24/20 0809 09/24/20 1150  GLUCAP 135* 113* 114* 104* 176*   Lipid Profile: No results for input(s): CHOL, HDL, LDLCALC, TRIG, CHOLHDL, LDLDIRECT in the last 72 hours. Thyroid Function Tests: No results for input(s): TSH, T4TOTAL, FREET4, T3FREE, THYROIDAB in the last 72 hours. Anemia Panel: No results for input(s): VITAMINB12, FOLATE, FERRITIN, TIBC, IRON, RETICCTPCT in the last 72 hours. Urine analysis:    Component Value Date/Time   COLORURINE  COLORLESS (A) 09/17/2020 0212   APPEARANCEUR CLEAR 09/17/2020 0212   APPEARANCEUR Clear 03/23/2014 2009   LABSPEC 1.009 09/17/2020 0212   LABSPEC 1.010 03/23/2014 2009   PHURINE 7.0 09/17/2020 0212   GLUCOSEU NEGATIVE 09/17/2020 0212   GLUCOSEU Negative 03/23/2014 2009   HGBUR LARGE (A) 09/17/2020 0212   BILIRUBINUR NEGATIVE 09/17/2020 0212   BILIRUBINUR Negative 03/23/2014 2009   KETONESUR 5 (A) 09/17/2020 0212   PROTEINUR NEGATIVE 09/17/2020 5277  NITRITE NEGATIVE 09/17/2020 0212   LEUKOCYTESUR NEGATIVE 09/17/2020 0212   LEUKOCYTESUR Negative 03/23/2014 2009   Sepsis Labs: @LABRCNTIP (procalcitonin:4,lacticidven:4) ) Recent Results (from the past 240 hour(s))  Resp Panel by RT-PCR (Flu A&B, Covid)     Status: None   Collection Time: 09/16/20  9:29 PM   Specimen: Nasopharyngeal(NP) swabs in vial transport medium  Result Value Ref Range Status   SARS Coronavirus 2 by RT PCR NEGATIVE NEGATIVE Final    Comment: (NOTE) SARS-CoV-2 target nucleic acids are NOT DETECTED.  The SARS-CoV-2 RNA is generally detectable in upper respiratory specimens during the acute phase of infection. The lowest concentration of SARS-CoV-2 viral copies this assay can detect is 138 copies/mL. A negative result does not preclude SARS-Cov-2 infection and should not be used as the sole basis for treatment or other patient management decisions. A negative result may occur with  improper specimen collection/handling, submission of specimen other than nasopharyngeal swab, presence of viral mutation(s) within the areas targeted by this assay, and inadequate number of viral copies(<138 copies/mL). A negative result must be combined with clinical observations, patient history, and epidemiological information. The expected result is Negative.  Fact Sheet for Patients:  EntrepreneurPulse.com.au  Fact Sheet for Healthcare Providers:  IncredibleEmployment.be  This test is no  t yet approved or cleared by the [place] FDA and  has been authorized for detection and/or diagnosis of SARS-CoV-2 by FDA under an Emergency Use Authorization (EUA). This EUA will remain  in effect (meaning this test can be used) for the duration of the COVID-19 declaration under Section 564(b)(1) of the Act, 21 U.S.C.section 360bbb-3(b)(1), unless the authorization is terminated  or revoked sooner.       Influenza A by PCR NEGATIVE NEGATIVE Final   Influenza B by PCR NEGATIVE NEGATIVE Final    Comment: (NOTE) The Xpert Xpress SARS-CoV-2/FLU/RSV plus assay is intended as an aid in the diagnosis of influenza from Nasopharyngeal swab specimens and should not be used as a sole basis for treatment. Nasal washings and aspirates are unacceptable for Xpert Xpress SARS-CoV-2/FLU/RSV testing.  Fact Sheet for Patients: EntrepreneurPulse.com.au  Fact Sheet for Healthcare Providers: IncredibleEmployment.be  This test is not yet approved or cleared by the [place] FDA and has been authorized for detection and/or diagnosis of SARS-CoV-2 by FDA under an Emergency Use Authorization (EUA). This EUA will remain in effect (meaning this test can be used) for the duration of the COVID-19 declaration under Section 564(b)(1) of the Act, 21 U.S.C. section 360bbb-3(b)(1), unless the authorization is terminated or revoked.  Performed at [facility], [place] [address], [place], [place] [address]   MRSA PCR Screening     Status: None   Collection Time: 09/17/20  1:49 AM   Specimen: Nasal Mucosa; Nasopharyngeal  Result Value Ref Range Status   MRSA by PCR NEGATIVE NEGATIVE Final    Comment:        The GeneXpert MRSA Assay (FDA approved for NASAL specimens only), is one component of a comprehensive MRSA colonization surveillance program. It is not intended to diagnose MRSA infection nor to guide or monitor treatment for MRSA infections. Performed  at [facility], [place] [address] [place], [place] [address]          Radiology Studies: No results found.    Scheduled Meds: . amLODipine  5 mg Oral Daily  . aspirin  81 mg Oral Daily  . Chlorhexidine Gluconate Cloth  6 each Topical Daily  . citalopram  20  mg Oral Daily  . colchicine  0.6 mg Oral Daily  . heparin  5,000 Units Subcutaneous Q8H  . levETIRAcetam  500 mg Oral BID  . metoprolol succinate  25 mg Oral Daily  . multivitamin with minerals  1 tablet Oral Daily  . pantoprazole  40 mg Oral Daily  . phenytoin  100 mg Oral TID  . tamsulosin  0.4 mg Oral Daily   Continuous Infusions:    LOS: 8 days      Debbe Odea, MD [facility] Pager: [email or web address] 09/24/2020, 1:44 PM

## 2020-09-24 NOTE — Progress Notes (Signed)
Physical Therapy Treatment Patient Details Name: Andres White MRN: [id number] DOB: 07-13-1946 Today's Date: 09/24/2020    History of Present Illness Pt is a 75 y.o. male with medical history significant of hypertension, arthritis, tobacco use x20 years, gout, previous CVAs (left midbrain, left frontal and parietal lobes, right parietotemporal lobe, bilateral thalamus, and bilateral cerebellum) presents to ED on 2/13 with seizures with L and upwards gaze deviation. x2 more seizures in ED. CTA head and neck shows calcified plaque at both carotid 5 locations with 20% stenosis of the proximal ICA on the right, 70% stenosis of the proximal ICA on the left. 50% stenosis of the origin of the nondominant right vertebral artery. CT initially shows no acute changes, repeat CT shows acute low-density area in L cerebellar peduncle which could indicate acute infarct. EEG shows moderate diffuse encephalopathy, ETT 2/13-2/15.    PT Comments    Pt tolerates treatment well, ambulating for increased distances. Pt continues to demonstrate significant trunk flexion when mobilizing and requires constant physical assistance to maintain balance currently. Pt demonstrates impaired cognition during session with poor short term memory, having difficulty recalling which buttons to press to call nurse and to power on TV despite PT providing education on the remote only moments prior. PT continues to recommend SNF placement at this time.   Follow Up Recommendations  SNF     Equipment Recommendations  Wheelchair (measurements PT);Wheelchair cushion (measurements PT);Rolling walker with 5" wheels    Recommendations for Other Services       Precautions / Restrictions Precautions Precautions: Fall Precaution Comments: posey belt and mittens on upon arrival, RN reporting to leave pt unrestrained at end of session Restrictions Weight Bearing Restrictions: No    Mobility  Bed Mobility Overal bed mobility: Needs  Assistance Bed Mobility: Supine to Sit     Supine to sit: Min guard;HOB elevated          Transfers Overall transfer level: Needs assistance Equipment used: Rolling walker (2 wheeled) Transfers: Sit to/from [facility] Sit to Stand: Mod assist Stand pivot transfers: Mod assist       General transfer comment: pt requires verbal cues for hand placement as well as physical assistance to power up into standing  Ambulation/Gait Ambulation/Gait assistance: Mod assist Gait Distance (Feet): 30 Feet (additional trial of 20') Assistive device: Rolling walker (2 wheeled) Gait Pattern/deviations: Step-to pattern;Trunk flexed Gait velocity: reduced Gait velocity interpretation: <1.31 ft/sec, indicative of household ambulator General Gait Details: pt with short step-to gait, increased trunk flexion despite PT cues to maintain RW close to BOS   Stairs             Wheelchair Mobility    Modified Rankin (Stroke Patients Only) Modified Rankin (Stroke Patients Only) Pre-Morbid Rankin Score: No symptoms Modified Rankin: Moderately severe disability     Balance Overall balance assessment: Needs assistance Sitting-balance support: No upper extremity supported;Feet supported Sitting balance-Leahy Scale: Fair     Standing balance support: Bilateral upper extremity supported Standing balance-Leahy Scale: Poor Standing balance comment: reliant on BUE support of RW and PT assist                            Cognition Arousal/Alertness: Awake/alert Behavior During Therapy: WFL for tasks assessed/performed Overall Cognitive Status: Impaired/Different from baseline Area of Impairment: Safety/judgement;Awareness;Problem solving  Safety/Judgement: Decreased awareness of deficits;Decreased awareness of safety Awareness: Emergent Problem Solving: Slow processing;Difficulty sequencing        Exercises      General  Comments General comments (skin integrity, edema, etc.): VSS on RA      Pertinent Vitals/Pain Pain Assessment: Faces Faces Pain Scale: No hurt    Home Living                      Prior Function            PT Goals (current goals can now be found in the care plan section) Acute Rehab PT Goals Patient Stated Goal: to get better Progress towards PT goals: Progressing toward goals    Frequency    Min 3X/week      PT Plan Current plan remains appropriate    Co-evaluation              AM-PAC PT "6 Clicks" Mobility   Outcome Measure  Help needed turning from your back to your side while in a flat bed without using bedrails?: A Little Help needed moving from lying on your back to sitting on the side of a flat bed without using bedrails?: A Little Help needed moving to and from a bed to a chair (including a wheelchair)?: A Lot Help needed standing up from a chair using your arms (e.g., wheelchair or bedside chair)?: A Lot Help needed to walk in hospital room?: A Lot Help needed climbing 3-5 steps with a railing? : Total 6 Click Score: 13    End of Session Equipment Utilized During Treatment: Gait belt Activity Tolerance: Patient tolerated treatment well Patient left: in chair;with call bell/phone within reach;with chair alarm set Nurse Communication: Mobility status PT Visit Diagnosis: Other abnormalities of gait and mobility (R26.89);Difficulty in walking, not elsewhere classified (R26.2);Muscle weakness (generalized) (M62.81);Unsteadiness on feet (R26.81)     Time: 1003-1030 PT Time Calculation (min) (ACUTE ONLY): 27 min  Charges:  $Gait Training: 8-22 mins $Therapeutic Activity: 8-22 mins                     Zenaida Niece, PT, DPT Acute Rehabilitation Pager: [phone]    Zenaida Niece 09/24/2020, 12:40 PM

## 2020-09-25 DIAGNOSIS — I6503 Occlusion and stenosis of bilateral vertebral arteries: Secondary | ICD-10-CM | POA: Diagnosis not present

## 2020-09-25 DIAGNOSIS — I6522 Occlusion and stenosis of left carotid artery: Secondary | ICD-10-CM | POA: Diagnosis not present

## 2020-09-25 DIAGNOSIS — F0391 Unspecified dementia with behavioral disturbance: Secondary | ICD-10-CM

## 2020-09-25 DIAGNOSIS — N183 Chronic kidney disease, stage 3 unspecified: Secondary | ICD-10-CM | POA: Diagnosis not present

## 2020-09-25 LAB — GLUCOSE, CAPILLARY
Glucose-Capillary: 104 mg/dL — ABNORMAL HIGH (ref 70–99)
Glucose-Capillary: 105 mg/dL — ABNORMAL HIGH (ref 70–99)
Glucose-Capillary: 74 mg/dL (ref 70–99)
Glucose-Capillary: 95 mg/dL (ref 70–99)

## 2020-09-25 MED ORDER — LEVETIRACETAM 500 MG PO TABS: 500.0000 mg | ORAL_TABLET | Freq: Two times a day (BID) | ORAL | 0 refills | Status: DC

## 2020-09-25 MED ORDER — PHENYTOIN SODIUM EXTENDED 100 MG PO CAPS: 100.0000 mg | ORAL_CAPSULE | Freq: Three times a day (TID) | ORAL | 0 refills | Status: DC

## 2020-09-25 MED ORDER — QUETIAPINE FUMARATE 50 MG PO TABS: 50.0000 mg | ORAL_TABLET | Freq: Every evening | ORAL | 0 refills | Status: DC | PRN

## 2020-09-25 NOTE — Progress Notes (Signed)
Physical Therapy Treatment Patient Details Name: Andres White MRN: [id number] DOB: 1945/08/30 Today's Date: 09/25/2020    History of Present Illness Pt is a 75 y.o. male with medical history significant of hypertension, arthritis, tobacco use x20 years, gout, previous CVAs (left midbrain, left frontal and parietal lobes, right parietotemporal lobe, bilateral thalamus, and bilateral cerebellum) presents to ED on 2/13 with seizures with L and upwards gaze deviation. x2 more seizures in ED. CTA head and neck shows calcified plaque at both carotid 5 locations with 20% stenosis of the proximal ICA on the right, 70% stenosis of the proximal ICA on the left. 50% stenosis of the origin of the nondominant right vertebral artery. CT initially shows no acute changes, repeat CT shows acute low-density area in L cerebellar peduncle which could indicate acute infarct. EEG shows moderate diffuse encephalopathy, ETT 2/13-2/15.    PT Comments    Pt required min guard assist supine to sit, mod assist transfers, and mod assist ambulation 10' x 3 with RW, +2 for chair follow. Daughter, [name], present and engaged in session. Verbalizes her desire for her father to return home with [facility]. She confirms family ability to provide needed level of assist and no additional DME needed. Discharge recommendations updated. Pt in recliner with feet elevated at end of session.    Follow Up Recommendations  Home health PT;Supervision/Assistance - 24 hour     Equipment Recommendations  None recommended by PT (Pt has all needed DME.)    Recommendations for Other Services       Precautions / Restrictions Precautions Precautions: Fall    Mobility  Bed Mobility Overal bed mobility: Needs Assistance Bed Mobility: Supine to Sit     Supine to sit: Min guard;HOB elevated     General bed mobility comments: +rail, increased time    Transfers Overall transfer level: Needs assistance Equipment used: Rolling walker (2  wheeled) Transfers: Sit to/from [facility] Sit to Stand: Mod assist Stand pivot transfers: Mod assist       General transfer comment: cues for sequencing; assist to power up, maintain balance, and manage RW  Ambulation/Gait Ambulation/Gait assistance: Mod assist;+2 safety/equipment Gait Distance (Feet): 10 Feet (x 3) Assistive device: Rolling walker (2 wheeled) Gait Pattern/deviations: Step-to pattern;Trunk flexed Gait velocity: decreased Gait velocity interpretation: <1.31 ft/sec, indicative of household ambulator General Gait Details: Hand over hand to maintain L grip on RW. Assist to maintain balance and manage RW. Continuous cues for posture. Truck flexion worsens with fatigue.   Stairs             Wheelchair Mobility    Modified Rankin (Stroke Patients Only) Modified Rankin (Stroke Patients Only) Pre-Morbid Rankin Score: No symptoms Modified Rankin: Moderately severe disability     Balance Overall balance assessment: Needs assistance Sitting-balance support: No upper extremity supported;Feet supported Sitting balance-Leahy Scale: Good     Standing balance support: Bilateral upper extremity supported;During functional activity Standing balance-Leahy Scale: Poor Standing balance comment: reliant on external support                            Cognition Arousal/Alertness: Awake/alert Behavior During Therapy: WFL for tasks assessed/performed Overall Cognitive Status: Impaired/Different from baseline Area of Impairment: Safety/judgement;Awareness;Problem solving                         Safety/Judgement: Decreased awareness of deficits;Decreased awareness of safety Awareness: Emergent Problem Solving: Slow processing;Difficulty sequencing  Exercises      General Comments General comments (skin integrity, edema, etc.): VSS on RA      Pertinent Vitals/Pain Pain Assessment: Faces Faces Pain Scale: No hurt     Home Living                      Prior Function            PT Goals (current goals can now be found in the care plan section) Acute Rehab PT Goals Patient Stated Goal: home Progress towards PT goals: Progressing toward goals    Frequency    Min 3X/week      PT Plan Discharge plan needs to be updated;Equipment recommendations need to be updated    Co-evaluation              AM-PAC PT "6 Clicks" Mobility   Outcome Measure  Help needed turning from your back to your side while in a flat bed without using bedrails?: A Little Help needed moving from lying on your back to sitting on the side of a flat bed without using bedrails?: A Little Help needed moving to and from a bed to a chair (including a wheelchair)?: A Lot Help needed standing up from a chair using your arms (e.g., wheelchair or bedside chair)?: A Lot Help needed to walk in hospital room?: A Lot Help needed climbing 3-5 steps with a railing? : Total 6 Click Score: 13    End of Session Equipment Utilized During Treatment: Gait belt Activity Tolerance: Patient tolerated treatment well Patient left: in chair;with call bell/phone within reach;with chair alarm set;with family/visitor present Nurse Communication: Mobility status PT Visit Diagnosis: Other abnormalities of gait and mobility (R26.89);Difficulty in walking, not elsewhere classified (R26.2);Muscle weakness (generalized) (M62.81);Unsteadiness on feet (R26.81)     Time: 1242-1310 PT Time Calculation (min) (ACUTE ONLY): 28 min  Charges:  $Gait Training: 23-37 mins                     Lorrin Goodell, [place]  Office # [phone] Pager [phone]    Lorriane Shire 09/25/2020, 1:29 PM

## 2020-09-25 NOTE — Discharge Summary (Signed)
Physician Discharge Summary  Andres White HYW:[id number] DOB: [birth year] DOA: 09/16/2020  PCP: [name], MD  Admit date: 09/16/2020 Discharge date: 09/25/2020  Admitted From: home Disposition:  home   Recommendations for Outpatient Follow-up:  1. Patient has dementia and at this time needs a caretaker to be with him around the clock- I have explained this to his daughter [facility]:  ordered  Discharge Condition:  stable   CODE STATUS:  Full code   Diet recommendation:  Heart healthy Consultations:  neurology  Procedures/Studies: . EEG   Discharge Diagnoses:  Principal Problem:   Status epilepticus ([place]) Active Problems:   Left carotid stenosis   Endotracheally intubated   Community acquired pneumonia   Cervical spondylosis   Vertebral artery stenosis   CKD (chronic kidney disease) stage 3, GFR 30-59 ml/min (HCC)     Brief Summary: Andres White is a 75 year old male with hypertension, gout and a history of CVA in 12/21 who presented to the hospital for seizure-like activity at home.  He had further seizures while in the ED and was intubated for airway protection while in a post ictal state.  He was loaded with Keppra and and Dilantin and admitted to the ICU. On 2/16, the patient self extubated. He is transferred to the triad hospitalist service on 2/17.   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 -Loaded with Keppra and Dilantin in the ED -Being continued on Keppra twice daily and Dilantin 3 times daily -Multiple EEGs have been performed and no seizure-like activity or epileptogenic focus found -MRI of the brain shows multiple chronic infarcts and mild to moderate atrophy of the brain  Active Problems:  Intracranial artery stenosis/  prior CVA of the brainstem in 12/21 - Per CTA, there is 70% stenosis of the left proximal ICA and 90% stenosis of left vertebral artery  - cont  aspirin, statin and risk factor modification for stroke prevention  Dementia with sundowning - started Seroquel at 8 PM which is working well for him- I have asked his daughter to use this PRN at home  Loss of appetite - has resolved  Hypertension -resume metoprolol   GERD -Continue Protonix  BPH -Continue Flomax  H/o Gout - on daily Colchicine- can continue this    Discharge Exam: Vitals:   09/25/20 0805 09/25/20 1156  BP: 126/63 102/70  Pulse: 75 76  Resp: 19 20  Temp: 99.1 F (37.3 C) 98.3 F (36.8 C)  SpO2: 100% 99%   Vitals:   09/25/20 0348 09/25/20 0500 09/25/20 0805 09/25/20 1156  BP: 139/75  126/63 102/70  Pulse: 80  75 76  Resp: 18  19 20   Temp: 100.2 F (37.9 C)  99.1 F (37.3 C) 98.3 F (36.8 C)  TempSrc: Oral  Oral Oral  SpO2: 97%  100% 99%  Weight:  81.2 kg    Height:        General: Pt is alert, awake, not in acute distress Cardiovascular: RRR, S1/S2 +, no rubs, no gallops Respiratory: CTA bilaterally, no wheezing, no rhonchi Abdominal: Soft, NT, ND, bowel sounds + Extremities: no edema, no cyanosis   Discharge Instructions  Discharge Instructions    Diet - low sodium heart healthy   Complete by: As directed    Increase activity slowly   Complete by: As directed      Allergies as of 09/25/2020   No Known Allergies     Medication List  TAKE these medications   acetaminophen 325 MG tablet Commonly known as: TYLENOL Take 1-2 tablets (325-650 mg total) by mouth every 4 (four) hours as needed for mild pain.   aspirin EC 81 MG tablet Take 1 tablet (81 mg total) by mouth daily.   atorvastatin 40 MG tablet Commonly known as: LIPITOR Take 40 mg by mouth daily.   citalopram 20 MG tablet Commonly known as: CELEXA Take 1 tablet (20 mg total) by mouth daily.   colchicine 0.6 MG tablet Take 0.6 mg by mouth daily.   diclofenac Sodium 1 % Gel Commonly known as: VOLTAREN Apply 2 g topically 4 (four) times daily.    gabapentin 100 MG capsule Commonly known as: NEURONTIN Take 1 capsule (100 mg total) by mouth 2 (two) times daily.   hydrocerin Crea Apply 1 application topically 2 (two) times daily.   levETIRAcetam 500 MG tablet Commonly known as: KEPPRA Take 1 tablet (500 mg total) by mouth 2 (two) times daily.   metoprolol succinate 25 MG 24 hr tablet Commonly known as: TOPROL-XL Take 1 tablet (25 mg total) by mouth daily. Take with or immediately following a meal.   multivitamin with minerals Tabs tablet Take 1 tablet by mouth daily.   pantoprazole 40 MG tablet Commonly known as: PROTONIX Take 1 tablet (40 mg total) by mouth 2 (two) times daily.   phenytoin 100 MG ER capsule Commonly known as: DILANTIN Take 1 capsule (100 mg total) by mouth 3 (three) times daily.   QUEtiapine 50 MG tablet Commonly known as: SEROQUEL Take 1 tablet (50 mg total) by mouth at bedtime as needed.   tamsulosin 0.4 MG Caps capsule Commonly known as: FLOMAX Take 1 capsule (0.4 mg total) by mouth daily.       No Known Allergies    CT Code Stroke CTA Head W/WO contrast  Result Date: 09/16/2020 CLINICAL DATA:  Left leg weakness.  Speech disturbance. EXAM: CT ANGIOGRAPHY HEAD AND NECK TECHNIQUE: Multidetector CT imaging of the head and neck was performed using the standard protocol during bolus administration of intravenous contrast. Multiplanar CT image reconstructions and MIPs were obtained to evaluate the vascular anatomy. Carotid stenosis measurements (when applicable) are obtained utilizing NASCET criteria, using the distal internal carotid diameter as the denominator. CONTRAST:  92mL OMNIPAQUE IOHEXOL 350 MG/ML SOLN COMPARISON:  Head CT same day. FINDINGS: CTA NECK FINDINGS Aortic arch: Aortic atherosclerotic calcification. Branching pattern is normal without origin stenosis. Right carotid system: Common carotid artery shows some calcified plaque but no stenosis. Pronounced calcified plaque present at the  carotid bifurcation and ICA bulb. Minimal diameter in the ICA bulb measures 4 mm. Compared to a more distal cervical ICA diameter of 5 mm, this indicates a 20% stenosis. Left carotid system: Common carotid artery widely patent to the bifurcation. Soft and calcified plaque at the carotid bifurcation and ICA bulb. Minimal diameter of the proximal ICA is 1.5 mm. Compared to a more distal cervical ICA diameter of 5 mm, this indicates a 70% stenosis. Vertebral arteries: Calcified plaque at the proximal non dominant right vertebral artery with 50% stenosis at the origin. Extensive calcified plaque at the proximal dominant left vertebral artery with severe stenosis, possibly 90%. Both vessels are patent beyond the proximal stenoses to the foramen magnum. Skeleton: Advanced chronic cervical spondylosis. Chronic fusion at the C5-6 level. Potential for significant canal stenosis particularly at C4-5 and C5-6. Other neck: No mass or lymphadenopathy. Upper chest: Hazy density in the posterior right upper lobe that could  indicate bronchopneumonia. Review of the MIP images confirms the above findings CTA HEAD FINDINGS Anterior circulation: Both internal carotid arteries are patent through the skull base and siphon regions. There is siphon atherosclerotic calcification but no stenosis greater than 30%. The anterior and middle cerebral vessels are patent. No large or medium vessel occlusion. Posterior circulation: Both vertebral arteries are patent through the foramen magnum to the basilar. No basilar stenosis. Posterior circulation branch vessels are patent. Bilateral posterior communicating arteries are present. Venous sinuses: Patent and normal. Anatomic variants: None significant. Review of the MIP images confirms the above findings IMPRESSION: 1. Aortic atherosclerosis. 2. Calcified plaque at both carotid bifurcations and ICA bulbs. 20% stenosis of the proximal ICA on the right. 70% stenosis of the proximal ICA on the left. 3.  50% stenosis at the origin of the non dominant right vertebral artery. Extensive calcified plaque at the proximal dominant left vertebral artery with severe stenosis, possibly 90%. Both vessels are patent beyond the proximal stenoses to the basilar. 4. No intracranial large or medium vessel occlusion or correctable proximal stenosis. 5. Hazy density in the posterior right upper lobe that could indicate bronchopneumonia. 6. Advanced chronic cervical spondylosis with potential for significant canal stenosis particularly at C4-5 and C5-6. Aortic Atherosclerosis (ICD10-I70.0). Electronically Signed   By: Nelson Chimes M.D.   On: 09/16/2020 21:30   CT Code Stroke CTA Neck W/WO contrast  Result Date: 09/16/2020 CLINICAL DATA:  Left leg weakness.  Speech disturbance. EXAM: CT ANGIOGRAPHY HEAD AND NECK TECHNIQUE: Multidetector CT imaging of the head and neck was performed using the standard protocol during bolus administration of intravenous contrast. Multiplanar CT image reconstructions and MIPs were obtained to evaluate the vascular anatomy. Carotid stenosis measurements (when applicable) are obtained utilizing NASCET criteria, using the distal internal carotid diameter as the denominator. CONTRAST:  63mL OMNIPAQUE IOHEXOL 350 MG/ML SOLN COMPARISON:  Head CT same day. FINDINGS: CTA NECK FINDINGS Aortic arch: Aortic atherosclerotic calcification. Branching pattern is normal without origin stenosis. Right carotid system: Common carotid artery shows some calcified plaque but no stenosis. Pronounced calcified plaque present at the carotid bifurcation and ICA bulb. Minimal diameter in the ICA bulb measures 4 mm. Compared to a more distal cervical ICA diameter of 5 mm, this indicates a 20% stenosis. Left carotid system: Common carotid artery widely patent to the bifurcation. Soft and calcified plaque at the carotid bifurcation and ICA bulb. Minimal diameter of the proximal ICA is 1.5 mm. Compared to a more distal cervical  ICA diameter of 5 mm, this indicates a 70% stenosis. Vertebral arteries: Calcified plaque at the proximal non dominant right vertebral artery with 50% stenosis at the origin. Extensive calcified plaque at the proximal dominant left vertebral artery with severe stenosis, possibly 90%. Both vessels are patent beyond the proximal stenoses to the foramen magnum. Skeleton: Advanced chronic cervical spondylosis. Chronic fusion at the C5-6 level. Potential for significant canal stenosis particularly at C4-5 and C5-6. Other neck: No mass or lymphadenopathy. Upper chest: Hazy density in the posterior right upper lobe that could indicate bronchopneumonia. Review of the MIP images confirms the above findings CTA HEAD FINDINGS Anterior circulation: Both internal carotid arteries are patent through the skull base and siphon regions. There is siphon atherosclerotic calcification but no stenosis greater than 30%. The anterior and middle cerebral vessels are patent. No large or medium vessel occlusion. Posterior circulation: Both vertebral arteries are patent through the foramen magnum to the basilar. No basilar stenosis. Posterior circulation branch vessels are patent.  Bilateral posterior communicating arteries are present. Venous sinuses: Patent and normal. Anatomic variants: None significant. Review of the MIP images confirms the above findings IMPRESSION: 1. Aortic atherosclerosis. 2. Calcified plaque at both carotid bifurcations and ICA bulbs. 20% stenosis of the proximal ICA on the right. 70% stenosis of the proximal ICA on the left. 3. 50% stenosis at the origin of the non dominant right vertebral artery. Extensive calcified plaque at the proximal dominant left vertebral artery with severe stenosis, possibly 90%. Both vessels are patent beyond the proximal stenoses to the basilar. 4. No intracranial large or medium vessel occlusion or correctable proximal stenosis. 5. Hazy density in the posterior right upper lobe that could  indicate bronchopneumonia. 6. Advanced chronic cervical spondylosis with potential for significant canal stenosis particularly at C4-5 and C5-6. Aortic Atherosclerosis (ICD10-I70.0). Electronically Signed   By: Nelson Chimes M.D.   On: 09/16/2020 21:30   MR BRAIN WO CONTRAST  Result Date: 09/20/2020 CLINICAL DATA:  Seizure, abnormal neuro exam. EXAM: MRI HEAD WITHOUT CONTRAST TECHNIQUE: Multiplanar, multiecho pulse sequences of the brain and surrounding structures were obtained without intravenous contrast. COMPARISON:  CT angiogram head/neck 09/16/2020. Head CT 09/16/2020. Brain MRI 06/24/2020. FINDINGS: Brain: The examination is intermittently motion degraded, limiting evaluation. Most notably, there is moderate motion degradation of the sagittal T1 weighted sequence, mild motion degradation of the axial T1 weighted sequence, mild-to-moderate motion degradation of the coronal T2 weighted sequence oriented perpendicular to the long axis of the hippocampi and mild motion degradation of the coronal T2 weighted sequence oriented perpendicular to the long axis of the hippocampi. Mild-to-moderate cerebral and cerebellar atrophy. Redemonstrated chronic cortically based infarcts within the left frontal and parietal lobes as well as right temporoparietal lobes. Redemonstrated small chronic infarcts within the left cerebral white matter, right caudate head, bilateral thalami and bilateral cerebellar hemispheres. Background moderate multifocal T2/FLAIR hyperintensity within the cerebral white matter is nonspecific, but compatible with chronic small vessel ischemic disease. Within the limitations of motion degradation, the hippocampi appear symmetric in size and signal. There is no acute infarct. No evidence of intracranial mass. No chronic intracranial blood products. No extra-axial fluid collection. No midline shift. Vascular: Expected proximal arterial flow voids. Skull and upper cervical spine: No focal marrow lesion  is identified. Sinuses/Orbits: Visualized orbits show no acute finding. Redemonstrated chronic deformity of the right lamina papyracea. Trace ethmoid sinus mucosal thickening. IMPRESSION: Motion degraded exam, as described. No evidence of acute intracranial abnormality. Redemonstrated small chronic cortically based infarcts within the left frontal and parietal lobes, as well as right temporoparietal lobes. Redemonstrated chronic infarcts within the left cerebral white matter, right basal ganglia, bilateral thalami and bilateral cerebellar hemispheres. Stable mild-to-moderate generalized atrophy of the brain and background moderate cerebral white matter chronic small vessel ischemic disease. Mild ethmoid sinus mucosal thickening. Electronically Signed   By: Kellie Simmering DO   On: 09/20/2020 11:21   DG CHEST PORT 1 VIEW  Result Date: 09/19/2020 CLINICAL DATA:  Wrist for aspiration. EXAM: PORTABLE CHEST 1 VIEW COMPARISON:  09/17/2020. FINDINGS: Endotracheal tube and feeding tube have been removed. Borderline cardiomegaly with pulmonary venous congestion. Mild bilateral interstitial prominence. Mild component CHF cannot be excluded. Pneumonitis cannot be excluded. Low lung volumes with mild left mid lung subsegmental atelectasis. IMPRESSION: 1. Interval removal of endotracheal tube and feeding tube. 2. Borderline cardiomegaly with pulmonary venous congestion. Mild bilateral interstitial prominence. A mild component of CHF cannot be excluded. Pneumonitis cannot be excluded. 3. Cm.  Mild left mid lung field  subsegmental atelectasis. Electronically Signed   By: Marcello Moores  Register   On: 09/19/2020 05:29   DG Chest Port 1 View  Result Date: 09/17/2020 CLINICAL DATA:  Hypoxia EXAM: PORTABLE CHEST 1 VIEW COMPARISON:  September 16, 2020 FINDINGS: Endotracheal tube tip is 2.9 cm above the carina. Nasogastric tube tip and side port are in the stomach. There is no appreciable pneumothorax. There is stable atelectatic change in  bases. There is equivocal left pleural effusion. Heart size and pulmonary vascularity are normal. No adenopathy. No bone lesions. IMPRESSION: Tube positions as described without pneumothorax. Equivocal left pleural effusion mild bibasilar atelectasis. No edema or airspace opacity. Stable cardiac silhouette. Electronically Signed   By: Lowella Grip III M.D.   On: 09/17/2020 07:57   DG Chest Portable 1 View  Result Date: 09/16/2020 CLINICAL DATA:  Intubation. EXAM: PORTABLE CHEST 1 VIEW COMPARISON:  07/11/2020 FINDINGS: Endotracheal tube tip 3.6 cm from the carina. Enteric tube in place with tip and side-port below the diaphragm. Lung volumes are low. Heart is normal in size with unchanged mediastinal contours. There is mild perivascular haziness suggesting vascular congestion. Subsegmental opacities at the left lung base. No pleural fluid or pneumothorax. No acute osseous abnormalities are seen. IMPRESSION: 1. Endotracheal tube tip 3.6 cm from the carina. Enteric tube in place. 2. Low lung volumes with mild vascular congestion. Electronically Signed   By: Keith Rake M.D.   On: 09/16/2020 22:24   EEG adult        [facility] [address] [place] [address] [phone]      Electroencephalogram Procedure Note Mr. JAMEY HARMAN Date of Birth:  09-26-1945 Medical Record Number:  [id number] Indications: Diagnostic Date of Procedure : 09/05/2020 Medications: gabapentin (Neurontin) Clinical history : 52 patient being evaluated for memory loss and seizures Technical Description This study was performed using 17 channel digital electroencephalographic recording equipment. International 10-20 electrode placement was used. The record was obtained with the patient awake, drowsy and asleep.  The record is of fair technical quality for purposes of interpretation. Activation Procedures:  hyperventilation and photic stimulation . EEG Description Awake: Alpha Activity: The waking state  record contains a well-defined bi-occipital alpha rhythm of  low amplitude with a dominant frequency of 9 hz. Reactivity is uncertain. No paroxsymal activity, spikes, or sharp waves are noted. Technical component of study is adequate. EKG tracing shows regular sinus rhythm Length of this recording is 24 minutes and 52 seconds Sleep: With drowsiness, there is attenuation of the background alpha activity. As the patient enters into light sleep, vertex waves and symmetrical spindles are noted. K complexes are noted in sleep. Transition to the waking state is unremarkable. Result of Activation Procedures: Hyperventilation: Hyperventilation for three minutes fails to activate the recording. Photo Stimulation: Photic stimulation failed to activated the recording. Summary Normal electroencephalogram, awake, asleep and with activation procedures. There are no focal lateralizing or epileptiform features.   Overnight EEG with video  Result Date: 09/17/2020 Lora Havens, MD     09/18/2020 10:29 AM Patient Name: Andres White MRN: [id number] Epilepsy Attending: Lora Havens Referring Physician/Provider: Dr. Amie [place] Duration: 09/16/2020 2327 to 09/17/2020 2327 Patient history: 75 year old male presented with seizure.  EEG to evaluate for seizures. Level of alertness: asleep AEDs during EEG study: LEV, PHT, Propofol Technical aspects: This EEG study was done with scalp electrodes positioned according to the 10-20 International system of electrode placement. Electrical activity was acquired at a sampling rate of 500Hz  and  reviewed with a high frequency filter of 70Hz  and a low frequency filter of 1Hz . EEG data were recorded continuously and digitally stored. Description: No posterior dominant rhythm was seen. Sleep was characterized by vertex waves, sleep spindles (12 to 14 Hz), maximal frontocentral region.  EEG showed continuous generalized 3 to 6 Hz theta-delta slowing. Hyperventilation and photic stimulation  were not performed.   Event button was pressed on 09/17/2020 at 1122 and 1559 for right upper extremity tremor which was difficult to visualize on camera.  Concomitant EEG before, during and after the event did not show any EEG changes suggest seizure. ABNORMALITY -Continuous slow, generalized IMPRESSION: This study is suggestive of moderate diffuse encephalopathy, nonspecific etiology.  Event button was pressed on 09/17/2020 at 1122 and1559 for right upper extremity tremor without concomitant EEG change.  Focal motor seizures may not be seen on scalp EEG, therefore clinical correlation is recommended.  Lora Havens   CT HEAD CODE STROKE WO CONTRAST  Addendum Date: 09/16/2020   ADDENDUM REPORT: 09/16/2020 21:33 ADDENDUM: Dr. Malen Gauze called to discuss the case. There could potentially be acute low-density in the left cerebellar peduncle that could indicate acute infarction in that region. There is some beam hardening in the area and I am not completely sure of that. Electronically Signed   By: Nelson Chimes M.D.   On: 09/16/2020 21:33   Result Date: 09/16/2020 CLINICAL DATA:  Code stroke. Left leg weakness. Speech disturbance. EXAM: CT HEAD WITHOUT CONTRAST TECHNIQUE: Contiguous axial images were obtained from the base of the skull through the vertex without intravenous contrast. COMPARISON:  06/30/2020 FINDINGS: Brain: There are old small vessel infarctions affecting the cerebellum. Chronic small-vessel changes are seen affecting the pons and midbrain. Old left thalamic lacunar infarction. Chronic small-vessel ischemic changes affect the cerebral hemispheric white matter. No sign of acute infarction, mass lesion, hemorrhage, hydrocephalus or extra-axial collection. Vascular: There is atherosclerotic calcification of the major vessels at the base of the brain. Skull: Negative Sinuses/Orbits: Clear/normal Other: None ASPECTS ([place] Stroke Program Early CT Score) - Ganglionic level infarction (caudate,  lentiform nuclei, internal capsule, insula, M1-M3 cortex): 7 - Supraganglionic infarction (M4-M6 cortex): 3 Total score (0-10 with 10 being normal): 10 IMPRESSION: 1. No acute finding by CT. Old small vessel infarctions affecting the cerebellum and left thalamus. Chronic small-vessel ischemic changes of the cerebral hemispheric white matter. 2. ASPECTS is 10. 3. Call report in progress. Electronically Signed: By: Nelson Chimes M.D. On: 09/16/2020 21:09     The results of significant diagnostics from this hospitalization (including imaging, microbiology, ancillary and laboratory) are listed below for reference.     Microbiology: Recent Results (from the past 240 hour(s))  Resp Panel by RT-PCR (Flu A&B, Covid)     Status: None   Collection Time: 09/16/20  9:29 PM   Specimen: Nasopharyngeal(NP) swabs in vial transport medium  Result Value Ref Range Status   SARS Coronavirus 2 by RT PCR NEGATIVE NEGATIVE Final    Comment: (NOTE) SARS-CoV-2 target nucleic acids are NOT DETECTED.  The SARS-CoV-2 RNA is generally detectable in upper respiratory specimens during the acute phase of infection. The lowest concentration of SARS-CoV-2 viral copies this assay can detect is 138 copies/mL. A negative result does not preclude SARS-Cov-2 infection and should not be used as the sole basis for treatment or other patient management decisions. A negative result may occur with  improper specimen collection/handling, submission of specimen other than nasopharyngeal swab, presence of viral mutation(s) within the areas targeted  by this assay, and inadequate number of viral copies(<138 copies/mL). A negative result must be combined with clinical observations, patient history, and epidemiological information. The expected result is Negative.  Fact Sheet for Patients:  EntrepreneurPulse.com.au  Fact Sheet for Healthcare Providers:  IncredibleEmployment.be  This test is no t  yet approved or cleared by the [place] FDA and  has been authorized for detection and/or diagnosis of SARS-CoV-2 by FDA under an Emergency Use Authorization (EUA). This EUA will remain  in effect (meaning this test can be used) for the duration of the COVID-19 declaration under Section 564(b)(1) of the Act, 21 U.S.C.section 360bbb-3(b)(1), unless the authorization is terminated  or revoked sooner.       Influenza A by PCR NEGATIVE NEGATIVE Final   Influenza B by PCR NEGATIVE NEGATIVE Final    Comment: (NOTE) The Xpert Xpress SARS-CoV-2/FLU/RSV plus assay is intended as an aid in the diagnosis of influenza from Nasopharyngeal swab specimens and should not be used as a sole basis for treatment. Nasal washings and aspirates are unacceptable for Xpert Xpress SARS-CoV-2/FLU/RSV testing.  Fact Sheet for Patients: EntrepreneurPulse.com.au  Fact Sheet for Healthcare Providers: IncredibleEmployment.be  This test is not yet approved or cleared by the [place] FDA and has been authorized for detection and/or diagnosis of SARS-CoV-2 by FDA under an Emergency Use Authorization (EUA). This EUA will remain in effect (meaning this test can be used) for the duration of the COVID-19 declaration under Section 564(b)(1) of the Act, 21 U.S.C. section 360bbb-3(b)(1), unless the authorization is terminated or revoked.  Performed at [facility], [place] [address], [place], [place] [address]   MRSA PCR Screening     Status: None   Collection Time: 09/17/20  1:49 AM   Specimen: Nasal Mucosa; Nasopharyngeal  Result Value Ref Range Status   MRSA by PCR NEGATIVE NEGATIVE Final    Comment:        The GeneXpert MRSA Assay (FDA approved for NASAL specimens only), is one component of a comprehensive MRSA colonization surveillance program. It is not intended to diagnose MRSA infection nor to guide or monitor treatment for MRSA infections. Performed  at [facility], [place] [address] [place], [place] [address]      Labs: BNP (last 3 results) No results for input(s): BNP in the last 8760 hours. Basic Metabolic Panel: Recent Labs  Lab 09/18/20 1801 09/19/20 0216 09/19/20 0442 09/20/20 0713 09/22/20 0430  NA 139  --  139 137 136  K 3.6  --  3.7 3.2* 3.6  CL 107  --  107 103 103  CO2 21*  --  20* 21* 19*  GLUCOSE 105*  --  91 105* 103*  BUN 9  --  7* 5* 8  CREATININE 1.14  --  0.96 0.88 1.00  CALCIUM 8.7*  --  8.9 9.1 9.0  MG 2.1 QUANTITY NOT SUFFICIENT, UNABLE TO PERFORM TEST 1.8 1.7  --   PHOS 2.7  --   --  3.4  --    Liver Function Tests: Recent Labs  Lab 09/19/20 0442  AST 34  ALT 14  ALKPHOS 70  BILITOT 0.4  PROT 7.1  ALBUMIN 2.9*   No results for input(s): LIPASE, AMYLASE in the last 168 hours. No results for input(s): AMMONIA in the last 168 hours. CBC: Recent Labs  Lab 09/19/20 0216 09/20/20 0713  WBC 11.4* 7.4  HGB 10.1* 10.8*  HCT 32.7* 34.5*  MCV 89.6 88.0  PLT 166 215  Cardiac Enzymes: No results for input(s): CKTOTAL, CKMB, CKMBINDEX, TROPONINI in the last 168 hours. BNP: Invalid input(s): POCBNP CBG: Recent Labs  Lab 09/24/20 2058 09/25/20 0035 09/25/20 0428 09/25/20 0808 09/25/20 1200  GLUCAP 164* 74 104* 95 105*   D-Dimer No results for input(s): DDIMER in the last 72 hours. Hgb A1c No results for input(s): HGBA1C in the last 72 hours. Lipid Profile No results for input(s): CHOL, HDL, LDLCALC, TRIG, CHOLHDL, LDLDIRECT in the last 72 hours. Thyroid function studies No results for input(s): TSH, T4TOTAL, T3FREE, THYROIDAB in the last 72 hours.  Invalid input(s): FREET3 Anemia work up No results for input(s): VITAMINB12, FOLATE, FERRITIN, TIBC, IRON, RETICCTPCT in the last 72 hours. Urinalysis    Component Value Date/Time   COLORURINE COLORLESS (A) 09/17/2020 0212   APPEARANCEUR CLEAR 09/17/2020 0212   APPEARANCEUR Clear 03/23/2014 2009   LABSPEC 1.009 09/17/2020  0212   LABSPEC 1.010 03/23/2014 2009   PHURINE 7.0 09/17/2020 0212   GLUCOSEU NEGATIVE 09/17/2020 0212   GLUCOSEU Negative 03/23/2014 2009   HGBUR LARGE (A) 09/17/2020 0212   BILIRUBINUR NEGATIVE 09/17/2020 0212   BILIRUBINUR Negative 03/23/2014 2009   KETONESUR 5 (A) 09/17/2020 0212   PROTEINUR NEGATIVE 09/17/2020 0212   NITRITE NEGATIVE 09/17/2020 0212   LEUKOCYTESUR NEGATIVE 09/17/2020 0212   LEUKOCYTESUR Negative 03/23/2014 2009   Sepsis Labs Invalid input(s): PROCALCITONIN,  WBC,  LACTICIDVEN Microbiology Recent Results (from the past 240 hour(s))  Resp Panel by RT-PCR (Flu A&B, Covid)     Status: None   Collection Time: 09/16/20  9:29 PM   Specimen: Nasopharyngeal(NP) swabs in vial transport medium  Result Value Ref Range Status   SARS Coronavirus 2 by RT PCR NEGATIVE NEGATIVE Final    Comment: (NOTE) SARS-CoV-2 target nucleic acids are NOT DETECTED.  The SARS-CoV-2 RNA is generally detectable in upper respiratory specimens during the acute phase of infection. The lowest concentration of SARS-CoV-2 viral copies this assay can detect is 138 copies/mL. A negative result does not preclude SARS-Cov-2 infection and should not be used as the sole basis for treatment or other patient management decisions. A negative result may occur with  improper specimen collection/handling, submission of specimen other than nasopharyngeal swab, presence of viral mutation(s) within the areas targeted by this assay, and inadequate number of viral copies(<138 copies/mL). A negative result must be combined with clinical observations, patient history, and epidemiological information. The expected result is Negative.  Fact Sheet for Patients:  EntrepreneurPulse.com.au  Fact Sheet for Healthcare Providers:  IncredibleEmployment.be  This test is no t yet approved or cleared by the [place] FDA and  has been authorized for detection and/or diagnosis of  SARS-CoV-2 by FDA under an Emergency Use Authorization (EUA). This EUA will remain  in effect (meaning this test can be used) for the duration of the COVID-19 declaration under Section 564(b)(1) of the Act, 21 U.S.C.section 360bbb-3(b)(1), unless the authorization is terminated  or revoked sooner.       Influenza A by PCR NEGATIVE NEGATIVE Final   Influenza B by PCR NEGATIVE NEGATIVE Final    Comment: (NOTE) The Xpert Xpress SARS-CoV-2/FLU/RSV plus assay is intended as an aid in the diagnosis of influenza from Nasopharyngeal swab specimens and should not be used as a sole basis for treatment. Nasal washings and aspirates are unacceptable for Xpert Xpress SARS-CoV-2/FLU/RSV testing.  Fact Sheet for Patients: EntrepreneurPulse.com.au  Fact Sheet for Healthcare Providers: IncredibleEmployment.be  This test is not yet approved or cleared by the [place] FDA  and has been authorized for detection and/or diagnosis of SARS-CoV-2 by FDA under an Emergency Use Authorization (EUA). This EUA will remain in effect (meaning this test can be used) for the duration of the COVID-19 declaration under Section 564(b)(1) of the Act, 21 U.S.C. section 360bbb-3(b)(1), unless the authorization is terminated or revoked.  Performed at [facility], [place] [address], [place], [place] [address]   MRSA PCR Screening     Status: None   Collection Time: 09/17/20  1:49 AM   Specimen: Nasal Mucosa; Nasopharyngeal  Result Value Ref Range Status   MRSA by PCR NEGATIVE NEGATIVE Final    Comment:        The GeneXpert MRSA Assay (FDA approved for NASAL specimens only), is one component of a comprehensive MRSA colonization surveillance program. It is not intended to diagnose MRSA infection nor to guide or monitor treatment for MRSA infections. Performed at [facility], [place] [address] [place], [place] [address]      Time coordinating discharge in  minutes: 65  SIGNED:   Debbe Odea, MD  [facility] 09/25/2020, 1:51 PM

## 2020-09-25 NOTE — Plan of Care (Signed)

## 2020-09-25 NOTE — Progress Notes (Signed)
Occupational Therapy Treatment Patient Details Name: Andres White MRN: [id number] DOB: [birth year] Today's Date: 09/25/2020    History of present illness Pt is a 75 y.o. male with medical history significant of hypertension, arthritis, tobacco use x20 years, gout, previous CVAs (left midbrain, left frontal and parietal lobes, right parietotemporal lobe, bilateral thalamus, and bilateral cerebellum) presents to ED on 2/13 with seizures with L and upwards gaze deviation. x2 more seizures in ED. CTA head and neck shows calcified plaque at both carotid 5 locations with 20% stenosis of the proximal ICA on the right, 70% stenosis of the proximal ICA on the left. 50% stenosis of the origin of the nondominant right vertebral artery. CT initially shows no acute changes, repeat CT shows acute low-density area in L cerebellar peduncle which could indicate acute infarct. EEG shows moderate diffuse encephalopathy, ETT 2/13-2/15.   OT comments  Patient seated in recliner upon entry. Daughter present at bedside for family education session. Daughter reports patient has necessary DME including RW, BSC and tub bench from previous admission. Daughter also reports assisting patient with bathing/dressing at baseline noting patient ability to wash front perineal area and buttocks. Daughter assists patient from recliner to EOB this session with use of gait belt and provides appropriate cueing for hand placement and safety throughout task. Daughter and patient confirm that patient is near baseline level of function. Education provided on car transfers with daughter reporting familiarity with technique. Patient would benefit from further OT services in [facility] setting to maximize safety and independence with self-care tasks, decrease risk of falls and decrease caregiver burden.    Follow Up Recommendations  Home health OT;Supervision/Assistance - 24 hour    Equipment Recommendations  None recommended by OT (Patient has necessary  DME.)    Recommendations for Other Services      Precautions / Restrictions Precautions Precautions: Fall Restrictions Weight Bearing Restrictions: No       Mobility Bed Mobility Overal bed mobility: Needs Assistance Bed Mobility: Sit to Supine     Supine to sit: Min guard;HOB elevated Sit to supine: Min guard   General bed mobility comments: Min guard for safety. Does not require external assist.    Transfers Overall transfer level: Needs assistance Equipment used: Rolling walker (2 wheeled) Transfers: Sit to/from [facility] Sit to Stand: Mod assist Stand pivot transfers: Mod assist       General transfer comment: Daughter assisted patient from recliner to EOB with use of RW and gait belt. Daughter gives appropriate cueing for hand placement and safety.    Balance Overall balance assessment: Needs assistance Sitting-balance support: No upper extremity supported;Feet supported Sitting balance-Leahy Scale: Good     Standing balance support: Bilateral upper extremity supported;During functional activity Standing balance-Leahy Scale: Poor Standing balance comment: Reliant on BUE on RW and external assist.                           ADL either performed or assessed with clinical judgement   ADL Overall ADL's : Needs assistance/impaired                     Lower Body Dressing: Minimal assistance;Sit to/from stand   Toilet Transfer: Minimal Insurance claims handler Details (indicate cue type and reason): Simulated with transfer to EOB with RW. Daughter able to assist with use of gait belt.         Functional mobility during ADLs: Minimal assistance;Rolling walker General ADL  Comments: Patient continues to be limited by LUE weakness, decreased cognition although greatly improved since initial evaluation, and decreased balance.     Vision       Perception     Praxis      Cognition Arousal/Alertness:  Awake/alert Behavior During Therapy: WFL for tasks assessed/performed Overall Cognitive Status: Impaired/Different from baseline Area of Impairment: Safety/judgement;Problem solving                       Following Commands: Follows one step commands consistently;Follows multi-step commands with increased time Safety/Judgement: Decreased awareness of deficits;Decreased awareness of safety Awareness: Emergent Problem Solving: Slow processing;Difficulty sequencing General Comments: Patient A&Ox4 this date. Follows 1-step commands consistently.        Exercises     Shoulder Instructions       General Comments Daughter present for family education session.    Pertinent Vitals/ Pain       Pain Assessment: No/denies pain Faces Pain Scale: No hurt  Home Living                                          Prior Functioning/Environment              Frequency  Min 2X/week        Progress Toward Goals  OT Goals(current goals can now be found in the care plan section)  Progress towards OT goals: Progressing toward goals  Acute Rehab OT Goals Patient Stated Goal: To return home. OT Goal Formulation: With patient/family Time For Goal Achievement: 10/04/20 Potential to Achieve Goals: Good ADL Goals Pt Will Perform Eating: with set-up;sitting Pt Will Perform Grooming: standing;with min guard assist Pt Will Perform Upper Body Dressing: with set-up;sitting Pt Will Perform Lower Body Dressing: sit to/from stand;with min guard assist Pt Will Transfer to Toilet: ambulating;with min guard assist Pt Will Perform Toileting - Clothing Manipulation and hygiene: sit to/from stand;with min guard assist Additional ADL Goal #1: Patient will maintain static sitting balance at EOB with no more than supervision A in prep for ADLs.  Plan Discharge plan needs to be updated;Frequency remains appropriate    Co-evaluation                 AM-PAC OT "6 Clicks"  Daily Activity     Outcome Measure   Help from another person eating meals?: None Help from another person taking care of personal grooming?: A Little Help from another person toileting, which includes using toliet, bedpan, or urinal?: A Little Help from another person bathing (including washing, rinsing, drying)?: A Lot Help from another person to put on and taking off regular upper body clothing?: A Little Help from another person to put on and taking off regular lower body clothing?: A Little 6 Click Score: 18    End of Session Equipment Utilized During Treatment: Gait belt;Rolling walker  OT Visit Diagnosis: Unsteadiness on feet (R26.81);Other abnormalities of gait and mobility (R26.89);Muscle weakness (generalized) (M62.81)   Activity Tolerance Patient tolerated treatment well   Patient Left in bed;with call bell/phone within reach;with chair alarm set;with family/visitor present   Nurse Communication Mobility status;Other (comment) (Daughter able to assist patient with functional transfers.)        Time: [phone] OT Time Calculation (min): 11 min  Charges: OT General Charges $OT Visit: 1 Visit OT Treatments $Therapeutic Activity: 8-22 mins  Infant Doane H. OTR/L  Supplemental OT, Department of rehab services [phone]   Kewon Statler R H. 09/25/2020, 2:41 PM

## 2020-09-25 NOTE — TOC Transition Note (Signed)
Transition of Care [facility]) - CM/SW Discharge Note   Patient Details  Name: Andres White MRN: [id number] Date of Birth: [birth year]  [facility] CM/SW Contact:  Geralynn Ochs, LCSW Phone Number: 09/25/2020, 1:42 PM   Clinical Narrative:    CSW contacted [facility] now that patient is out of restraints; they had offered a bed for the patient. CSW then contacted daughter, Andres White, to provide update. Andres White asked why exactly patient was being recommended for rehab, because she feels that he is now back at his baseline and she would rather just take him home. Patient was previously receiving [facility] with [facility], and has all needed DME at this time. CSW updated MD, who will discharge patient home today. CSW contacted [facility] to let them know that patient is discharging home, they will resume services. No other needs at this time. CSW signing off.    Final next level of care: [place] Barriers to Discharge: Barriers Resolved   Patient Goals and CMS Choice Patient states their goals for this hospitalization and ongoing recovery are:: Rehab CMS Medicare.gov Compare Post Acute Care list provided to:: Patient Represenative (must comment) (daughter) Choice offered to / list presented to : Adult Children  Discharge Placement                Patient to be transferred to facility by: Family Name of family member notified: Andres White Patient and family notified of of transfer: 09/25/20  Discharge Plan and Services In-house Referral: Clinical Social Work   Post Acute Care Choice: [place]: PT,OT,Speech Therapy [place] Agency: [place] ([place]) Date [place]: 09/25/20   Representative spoke with at [place]: [place] ([place]) Interventions     Readmission Risk Interventions No flowsheet data found.

## 2020-09-27 ENCOUNTER — Telehealth: Payer: Self-pay | Admitting: *Deleted

## 2020-09-27 ENCOUNTER — Telehealth: Payer: Self-pay

## 2020-09-27 NOTE — Telephone Encounter (Addendum)
Andres White PT with [facility] called and is requesting PT extension 2wk4. They are also asking for a [place] aide 2 wk 4 to assist with his personal care. He has not been seen by Dr Ranell Patrick but he did come in and see Zella Ball on 07/20/20.  He has appt scheduled with Dr. Ranell Patrick on 10/25/20.  He has just been discharged by hospitalist from another hospitalization on 09/25/20. I question if we should ok the [facility] or should the primary care , but he is still under our care.  Please advise.

## 2020-09-27 NOTE — Telephone Encounter (Signed)
Shelda Pal, SLP from [facility] called requesting HHST 1wk1, 2wk4 for cognition and dysphagia. Orders approved and given.

## 2020-09-27 NOTE — Telephone Encounter (Signed)
Kelly notified.

## 2020-09-27 NOTE — Telephone Encounter (Signed)
It is ok to approve HH, I remember him from inpatient

## 2020-09-28 ENCOUNTER — Encounter: Payer: Self-pay | Admitting: Gastroenterology

## 2020-09-28 ENCOUNTER — Ambulatory Visit (INDEPENDENT_AMBULATORY_CARE_PROVIDER_SITE_OTHER): Payer: Medicare Other | Admitting: Gastroenterology

## 2020-09-28 VITALS — BP 128/76 | HR 72 | Ht 67.0 in | Wt 176.6 lb

## 2020-09-28 DIAGNOSIS — Z8673 Personal history of transient ischemic attack (TIA), and cerebral infarction without residual deficits: Secondary | ICD-10-CM

## 2020-09-28 DIAGNOSIS — D132 Benign neoplasm of duodenum: Secondary | ICD-10-CM

## 2020-09-28 DIAGNOSIS — K2950 Unspecified chronic gastritis without bleeding: Secondary | ICD-10-CM | POA: Diagnosis not present

## 2020-09-28 DIAGNOSIS — Z86010 Personal history of colonic polyps: Secondary | ICD-10-CM

## 2020-09-28 DIAGNOSIS — R198 Other specified symptoms and signs involving the digestive system and abdomen: Secondary | ICD-10-CM | POA: Diagnosis not present

## 2020-09-28 DIAGNOSIS — D649 Anemia, unspecified: Secondary | ICD-10-CM

## 2020-09-28 DIAGNOSIS — Z8669 Personal history of other diseases of the nervous system and sense organs: Secondary | ICD-10-CM

## 2020-09-28 DIAGNOSIS — K5900 Constipation, unspecified: Secondary | ICD-10-CM

## 2020-09-28 MED ORDER — SUPREP BOWEL PREP KIT 17.5-3.13-1.6 GM/177ML PO SOLN: 1.0000 | ORAL | 0 refills | Status: DC

## 2020-09-28 NOTE — Patient Instructions (Signed)
START: Miralax 1 capful (17grams) daily.   We have sent the following medications to your pharmacy for you to pick up at your convenience: [place] provider has requested that you go to the basement level for lab on 10/19/20 Press "B" on the elevator. The lab is located at the first door on the left as you exit the elevator.  You have been scheduled for an endoscopy and colonoscopy. Please follow the written instructions given to you at your visit today. Please pick up your prep supplies at the pharmacy within the next 1-3 days. If you use inhalers (even only as needed), please bring them with you on the day of your procedure.   Due to recent changes in healthcare laws, you may see the results of your imaging and laboratory studies on MyChart before your provider has had a chance to review them.  We understand that in some cases there may be results that are confusing or concerning to you. Not all laboratory results come back in the same time frame and the provider may be waiting for multiple results in order to interpret others.  Please give [place] 48 hours in order for your provider to thoroughly review all the results before contacting the office for clarification of your results.   Thank you for choosing me and [facility].  Dr. Rush Landmark

## 2020-09-28 NOTE — Progress Notes (Signed)
.  h 

## 2020-09-29 ENCOUNTER — Other Ambulatory Visit: Payer: Self-pay | Admitting: Physical Medicine and Rehabilitation

## 2020-10-02 ENCOUNTER — Encounter: Payer: Self-pay | Admitting: Gastroenterology

## 2020-10-02 DIAGNOSIS — Z8673 Personal history of transient ischemic attack (TIA), and cerebral infarction without residual deficits: Secondary | ICD-10-CM | POA: Insufficient documentation

## 2020-10-02 DIAGNOSIS — D132 Benign neoplasm of duodenum: Secondary | ICD-10-CM | POA: Insufficient documentation

## 2020-10-02 DIAGNOSIS — K2950 Unspecified chronic gastritis without bleeding: Secondary | ICD-10-CM | POA: Insufficient documentation

## 2020-10-02 DIAGNOSIS — Z86010 Personal history of colonic polyps: Secondary | ICD-10-CM | POA: Insufficient documentation

## 2020-10-02 DIAGNOSIS — K5900 Constipation, unspecified: Secondary | ICD-10-CM | POA: Insufficient documentation

## 2020-10-02 DIAGNOSIS — D649 Anemia, unspecified: Secondary | ICD-10-CM | POA: Insufficient documentation

## 2020-10-02 DIAGNOSIS — R198 Other specified symptoms and signs involving the digestive system and abdomen: Secondary | ICD-10-CM | POA: Insufficient documentation

## 2020-10-02 DIAGNOSIS — Z8669 Personal history of other diseases of the nervous system and sense organs: Secondary | ICD-10-CM | POA: Insufficient documentation

## 2020-10-02 NOTE — Progress Notes (Signed)
[facility] VISIT   Primary Care Provider [name], MD [address] [place] [address] [place] [address] [phone]  Referring Provider Dr. Fuller Plan  Patient Profile: SHELLEY POOLEY is a 75 y.o. male with a pmh significant for prior CVA, carotid artery stenosis, spinal stenosis, hypertension, hyperlipidemia, gout, new seizure disorder, arthritis, colon polyps, duodenal adenomas, gastritis.  The patient presents to the [facility] for an evaluation and management of problem(s) noted below:  Problem List 1. Adenomatous duodenal polyp   2. 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, unspecified gastritis type   3. Abnormal findings on esophagogastroduodenoscopy (EGD)   4. History of colon polyps   5. History of stroke   6. History of seizure disorder   7. Anemia, unspecified type   8. Constipation, unspecified constipation type     History of Present Illness This is a patient who was evaluated by Dr. Fuller Plan in December for heme positive stool after recent hospitalization for acute CVA.  No antiplatelet therapy other than aspirin was on board.  He underwent a upper and lower endoscopy with evidence of multiple colon polyps that were not removed due to poor preparation.  3 duodenal polyps were found on endoscopy with one of them returning as an adenoma though the other 2 had appearances of adenoma as well.  The patient was referred for consideration of duodenal adenoma resection but is also due for colon polyp resection.  He recently was readmitted to the hospital and found to have a seizure disorder and is now on antiepileptics.  From a GI standpoint he is not having any significant issues or complaints.  He is having his normal constipation which is unchanged for him with a bowel movement every day or every other day.  No blood in his stools overtly has been seen (melena/hematochezia/maroon).  He denies any significant pyrosis or dysphagia.  He  remains only on aspirin and not on Plavix.  GI Review of Systems Positive as above Negative for odynophagia, nausea, vomiting, change in bowel habits  Review of Systems General: Denies fevers/chills/weight loss unintentionally Cardiovascular: Denies chest pain/palpitations Pulmonary: Denies shortness of breath Gastroenterological: See HPI Genitourinary: Denies darkened urine Hematological: Denies easy bruising/bleeding Dermatological: Denies jaundice Psychological: Mood is stable   Medications Current Outpatient Medications  Medication Sig Dispense Refill  . acetaminophen (TYLENOL) 325 MG tablet Take 1-2 tablets (325-650 mg total) by mouth every 4 (four) hours as needed for mild pain.    [name] aspirin EC 81 MG tablet Take 1 tablet (81 mg total) by mouth daily. 90 tablet 3  . atorvastatin (LIPITOR) 40 MG tablet Take 40 mg by mouth daily.    . citalopram (CELEXA) 20 MG tablet Take 1 tablet (20 mg total) by mouth daily. 30 tablet 0  . colchicine 0.6 MG tablet Take 0.6 mg by mouth daily.    . diclofenac Sodium (VOLTAREN) 1 % GEL Apply 2 g topically 4 (four) times daily. 100 g 1  . gabapentin (NEURONTIN) 100 MG capsule Take 1 capsule (100 mg total) by mouth 2 (two) times daily. 60 capsule 0  . hydrocerin (EUCERIN) CREA Apply 1 application topically 2 (two) times daily.  0  . levETIRAcetam (KEPPRA) 500 MG tablet Take 1 tablet (500 mg total) by mouth 2 (two) times daily. 60 tablet 0  . metoprolol succinate (TOPROL-XL) 25 MG 24 hr tablet Take 1 tablet (25 mg total) by mouth daily. Take with or immediately following a meal. 90 tablet 1  . Multiple Vitamin (  MULTIVITAMIN WITH MINERALS) TABS tablet Take 1 tablet by mouth daily.    . Na Sulfate-K Sulfate-Mg Sulf (SUPREP BOWEL PREP KIT) 17.5-3.13-1.6 GM/177ML SOLN Take 1 kit by mouth as directed. For colonoscopy prep 354 mL 0  . phenytoin (DILANTIN) 100 MG ER capsule Take 1 capsule (100 mg total) by mouth 3 (three) times daily. 90 capsule 0  .  QUEtiapine (SEROQUEL) 50 MG tablet Take 1 tablet (50 mg total) by mouth at bedtime as needed. 30 tablet 0  . tamsulosin (FLOMAX) 0.4 MG CAPS capsule Take 1 capsule (0.4 mg total) by mouth daily. 30 capsule 0  . pantoprazole (PROTONIX) 40 MG tablet TAKE 1 TABLET(40 MG) BY MOUTH TWICE DAILY 60 tablet 1   No current facility-administered medications for this visit.    Allergies No Known Allergies  Histories Past Medical History:  Diagnosis Date  . Arthritis   . Chronic back pain   . Duodenal adenoma   . Gout   . Hypertension   . Stroke ([place]) 06/2020  . Syncope and collapse    Past Surgical History:  Procedure Laterality Date  . BIOPSY  07/13/2020   Procedure: BIOPSY;  Surgeon: Ladene Artist, MD;  Location: [facility] ENDOSCOPY;  Service: Endoscopy;;  . CARDIAC CATHETERIZATION  2015   [facility]   . COLONOSCOPY WITH PROPOFOL N/A 07/13/2020   Procedure: COLONOSCOPY WITH PROPOFOL;  Surgeon: Ladene Artist, MD;  Location: [facility] ENDOSCOPY;  Service: Endoscopy;  Laterality: N/A;  . ESOPHAGOGASTRODUODENOSCOPY N/A 07/13/2020   Procedure: ESOPHAGOGASTRODUODENOSCOPY (EGD);  Surgeon: Ladene Artist, MD;  Location[facility] ENDOSCOPY;  Service: Endoscopy;  Laterality: N/A;  . KNEE SURGERY Right   . POLYPECTOMY  07/13/2020   Procedure: POLYPECTOMY;  Surgeon: Ladene Artist, MD;  Location: [facility] ENDOSCOPY;  Service: Endoscopy;;   Social History   Socioeconomic History  . Marital status: Single    Spouse name: Not on file  . Number of children: Not on file  . Years of education: Not on file  . Highest education level: Not on file  Occupational History  . Not on file  Tobacco Use  . Smoking status: Current Every Day Smoker    Years: 20.00    Types: Cigarettes  . Smokeless tobacco: Never Used  Vaping Use  . Vaping Use: Never used  Substance and Sexual Activity  . Alcohol use: Not Currently    Comment: occasional  . Drug use: No  . Sexual activity: Not on file  Other Topics Concern  . Not on file   Social History Narrative   Lives with daughter, son in law and 2 grandchildren   Right Handed   Drinks 1 cup caffeine 4 times a week-ish   Social Determinants of Health   Financial Resource Strain: Not on file  Food Insecurity: Not on file  Transportation Needs: Not on file  Physical Activity: Not on file  Stress: Not on file  Social Connections: Not on file  Intimate Partner Violence: Not on file   Family History  Problem Relation Age of Onset  . Hypertension Mother   . Colon cancer Neg Hx   . Pancreatic cancer Neg Hx   . Stomach cancer Neg Hx   . Esophageal cancer Neg Hx   . Inflammatory bowel disease Neg Hx   . Liver disease Neg Hx   . Rectal cancer Neg Hx    I have reviewed his medical, social, and family history in detail and updated the electronic medical record as necessary.  PHYSICAL EXAMINATION  BP 128/76   Pulse 72   Ht 5' 7"  (1.702 m)   Wt 176 lb 9.6 oz (80.1 kg)   BMI 27.66 kg/m  Wt Readings from Last 3 Encounters:  09/28/20 176 lb 9.6 oz (80.1 kg)  09/25/20 179 lb 0.2 oz (81.2 kg)  09/14/20 179 lb 9.6 oz (81.5 kg)  GEN: NAD, appears elderly his stated age, nontoxic, accompanied by family member PSYCH: Cooperative, without pressured speech EYE: Conjunctivae pink, sclerae anicteric ENT: Masked CV: Nontachycardic RESP: No audible wheezing GI: NABS, soft, NT/ND, without rebound or guarding MSK/EXT: Bilateral lower extremity edema present SKIN: No jaundice NEURO:  Alert & Oriented x 3, no focal deficits   REVIEW OF DATA  I reviewed the following data at the time of this encounter:  GI Procedures and Studies  December 2021 colonoscopy - Preparation of the colon was inadequate. - Stool in the entire examined colon. - Eight 5 to 8 mm polyps in the sigmoid colon, in the descending colon, in the transverse colon and in the ascending colon. Not removed due to large amount of stool. - Non-bleeding internal hemorrhoids. - The examination was otherwise  normal on direct and retroflexion views.  December 2021 EGD - Normal esophagus. - Mild gastritis. Biopsied. - Three duodenal polyps. Biopsied.  Pathology FINAL MICROSCOPIC DIAGNOSIS:  A. DUODENAL, PROXIMAL, POLYPECTOMY:  - Polypoid duodenal mucosa.  - Negative for dysplasia.  B. DUODENAL, DISTAL, POLYPECTOMY:  - Adenoma.  - Negative for high grade dysplasia.  C. DUODENAL, DISTAL #2, POLYPECTOMY:  - Polypoid duodenal mucosa with reactive changes.  - Negative for dysplasia.  D. STOMACH, ANTRUM AND BODY, BIOPSY:  - Gastric antral mucosa with mild reactive gastropathy.  - Gastric oxyntic mucosa with mild chronic gastritis.  - Warthin-Starry stain is negative for Helicobacter pylori.   Laboratory Studies  Reviewed those in epic  Imaging Studies  No relevant studies to review   ASSESSMENT  Mr. Hunsucker is a 75 y.o. male with a pmh significant for prior CVA, carotid artery stenosis, spinal stenosis, hypertension, hyperlipidemia, gout, new seizure disorder, arthritis, colon polyps, duodenal adenomas, gastritis.  The patient is seen today for evaluation and management of:  1. Adenomatous duodenal polyp   2. 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, unspecified gastritis type   3. Abnormal findings on esophagogastroduodenoscopy (EGD)   4. History of colon polyps   5. History of stroke   6. History of seizure disorder   7. Anemia, unspecified type   8. Constipation, unspecified constipation type    The patient is hemodynamically stable.  Clinically from a GI standpoint he is also stable.  He has evidence of at least 1 duodenal adenoma with 2 other polyps that were adenomatous appearing.  Reasonable for resection of these lesions and we would be happy to move forward with getting this scheduled in the coming months.  He is due for colon polyp removal as well in setting of his poor preparation these were not removed in December.  He is currently scheduled for this with Dr. Fuller Plan later in March.   However, with the recent seizure disorder, our endoscopy center may not be able to accommodate this at that time.  I will discuss with Dr. Fuller Plan to consideration of doing his colonoscopy at the same time as his endoscopy in the hospital-based setting where he will be able to be monitored closely in the setting of his new seizure disorder.  The patient and family would like to try and minimize anesthesia events  if possible and that would be their preference if Dr. Fuller Plan agrees.  The risks and benefits of endoscopic evaluation were discussed with the patient; these include but are not limited to the risk of perforation, infection, bleeding, missed lesions, lack of diagnosis, severe illness requiring hospitalization, as well as anesthesia and sedation related illnesses.  The patient is agreeable to proceed.   We will move forward with obtaining laboratories to evaluate ensure that he does not have a persistent anemia and understand what type of anemia this could be as well.  All patient questions were answered to the best of my ability, and the patient agrees to the aforementioned plan of action with follow-up as indicated.   PLAN  Laboratories as outlined below Initiate MiraLAX once to twice daily for optimization of bowel habits Proceed with scheduling EGD in the next 2 to 3 months for resection of duodenal adenomas We will discuss with Dr. Fuller Plan consideration of postponing LAC colonoscopy and allowing me to do colonoscopy at time of EGD due to recent seizure disorder diagnosis Colonoscopy with 2-day preparation will be required   Orders Placed This Encounter  Procedures  . Procedural/ Surgical Case Request: COLONOSCOPY WITH PROPOFOL, ESOPHAGOGASTRODUODENOSCOPY (EGD) WITH PROPOFOL, ENDOSCOPIC MUCOSAL RESECTION  . CBC  . Basic Metabolic Panel (BMET)  . IBC + Ferritin  . B12  . Folate  . INR/PT  . Ambulatory referral to Gastroenterology    New Prescriptions   NA SULFATE-K SULFATE-MG SULF  (SUPREP BOWEL PREP KIT) 17.5-3.13-1.6 GM/177ML SOLN    Take 1 kit by mouth as directed. For colonoscopy prep   Modified Medications   Modified Medication Previous Medication   PANTOPRAZOLE (PROTONIX) 40 MG TABLET pantoprazole (PROTONIX) 40 MG tablet      TAKE 1 TABLET(40 MG) BY MOUTH TWICE DAILY    Take 1 tablet (40 mg total) by mouth 2 (two) times daily.    Planned Follow Up No follow-ups on file.   Total Time in Face-to-Face and in Coordination of Care for patient including independent/personal interpretation/review of prior testing, medical history, examination, medication adjustment, communicating results with the patient directly, and documentation with the EHR is 30 minutes.   Justice Britain, MD [facility] Office # [phone]

## 2020-10-04 ENCOUNTER — Telehealth: Payer: Self-pay | Admitting: *Deleted

## 2020-10-04 NOTE — Telephone Encounter (Signed)
Please see my staff message for details. Briefly GM is proceeding EGD/EUS/EMR and colonoscopy at the hospital and his colonoscopy with me in the [facility] should be canceled.

## 2020-10-04 NOTE — Telephone Encounter (Signed)
Cancelled 3-29 [place] colon with Dr Fuller Plan-   Attempted Daughter to RS PV to closer to [place] [address] ECL date- [facility] to me = Marijean Niemann

## 2020-10-04 NOTE — Telephone Encounter (Signed)
Dr. Fuller Plan, did you see the notes from Dr. Rush Landmark?  Are you okay with him performing colonoscopy during the EGD?

## 2020-10-04 NOTE — Telephone Encounter (Signed)
Andres White,  This is on the note from 09-2020 Dr [facility]  Laboratories as outlined below Initiate MiraLAX once to twice daily for optimization of bowel habits Proceed with scheduling EGD in the next 2 to 3 months for resection of duodenal adenomas We will discuss with Dr. Fuller Plan consideration of postponing LAC colonoscopy and allowing me to do colonoscopy at time of EGD due to recent seizure disorder diagnosis Colonoscopy with 2-day preparation will be required  He is schedule in April for an [facility] and then he is also on the schedule for 3-29 [place] colon with Fuller Plan- I cannot find where [facility] ECL was ok  Does he need the 3-29 Colon in the [facility]??  OV note states family is trying to decrease sedation due to New Seizure Disorder   Thanks,Marie PV

## 2020-10-04 NOTE — Telephone Encounter (Signed)
Spoke with daughter- informed 3-29 [place] colon cancelled- I was unable to move the PV to April- pt to keep 3-15 PV as scheduled - daughter aware

## 2020-10-08 ENCOUNTER — Telehealth: Payer: Self-pay | Admitting: *Deleted

## 2020-10-08 NOTE — Telephone Encounter (Signed)
Manuela Schwartz OT [facility] called for OT POC of 1wk2, 2wk4. Approval given.

## 2020-10-09 ENCOUNTER — Telehealth: Payer: Self-pay | Admitting: *Deleted

## 2020-10-09 NOTE — Telephone Encounter (Signed)
Darnell PT [facility] called to request POC 2wk4 to address muscle weakness and balance. Approval given.

## 2020-10-12 ENCOUNTER — Telehealth: Payer: Self-pay | Admitting: *Deleted

## 2020-10-12 ENCOUNTER — Telehealth: Payer: Self-pay

## 2020-10-12 ENCOUNTER — Telehealth: Payer: Self-pay | Admitting: Cardiovascular Disease

## 2020-10-12 NOTE — Telephone Encounter (Signed)
STAT if HR is under 50 or over 120 (normal HR is 60-100 beats per minute)  1) What is your heart rate? Not sure now   2) Do you have a log of your heart rate readings (document readings)?   10/10/20 45 and then 44 after checking again   3) Do you have any other symptoms? No   Manuela Schwartz is calling stating when she did a home visit Wednesday 10/10/20 the patient's HR was 44-45 at the time of visit. She is requesting the pt's daughter/caregiver be called back in regards to this. Please advise.

## 2020-10-12 NOTE — Telephone Encounter (Signed)
Called patient's daughter okay per DPR, left message to call back to discuss how her dad has been feeling and how his HR was today.   Left call back number.

## 2020-10-12 NOTE — Telephone Encounter (Signed)
Agree with call to cardiologist office, thank you!

## 2020-10-12 NOTE — Telephone Encounter (Signed)
Manuela Schwartz  RN [facility] Nurse): Mr. Ruddock heart rate on assessment  today was 63 & 45. Manuela Schwartz RN did not know if the medication was the cause for the low rate. But she has a call out to Cardiologist office for advise.    Please advise. Thank you.

## 2020-10-12 NOTE — Telephone Encounter (Signed)
[facility]OT request for 1wk1,2wk3.  Approval given. (His insurance changed and they had to d/c the old POC and submit a new one).

## 2020-10-16 DIAGNOSIS — M1039 Gout due to renal impairment, multiple sites: Secondary | ICD-10-CM

## 2020-10-16 DIAGNOSIS — I69322 Dysarthria following cerebral infarction: Secondary | ICD-10-CM

## 2020-10-16 DIAGNOSIS — I69315 Cognitive social or emotional deficit following cerebral infarction: Secondary | ICD-10-CM

## 2020-10-16 DIAGNOSIS — M4800 Spinal stenosis, site unspecified: Secondary | ICD-10-CM

## 2020-10-16 DIAGNOSIS — N189 Chronic kidney disease, unspecified: Secondary | ICD-10-CM

## 2020-10-16 DIAGNOSIS — R1311 Dysphagia, oral phase: Secondary | ICD-10-CM

## 2020-10-16 DIAGNOSIS — I69391 Dysphagia following cerebral infarction: Secondary | ICD-10-CM

## 2020-10-16 DIAGNOSIS — I129 Hypertensive chronic kidney disease with stage 1 through stage 4 chronic kidney disease, or unspecified chronic kidney disease: Secondary | ICD-10-CM

## 2020-10-16 DIAGNOSIS — E785 Hyperlipidemia, unspecified: Secondary | ICD-10-CM

## 2020-10-16 DIAGNOSIS — G8929 Other chronic pain: Secondary | ICD-10-CM

## 2020-10-16 DIAGNOSIS — I6523 Occlusion and stenosis of bilateral carotid arteries: Secondary | ICD-10-CM

## 2020-10-16 DIAGNOSIS — M1990 Unspecified osteoarthritis, unspecified site: Secondary | ICD-10-CM

## 2020-10-16 DIAGNOSIS — N179 Acute kidney failure, unspecified: Secondary | ICD-10-CM

## 2020-10-16 DIAGNOSIS — D631 Anemia in chronic kidney disease: Secondary | ICD-10-CM

## 2020-10-16 DIAGNOSIS — I69351 Hemiplegia and hemiparesis following cerebral infarction affecting right dominant side: Secondary | ICD-10-CM

## 2020-10-16 NOTE — Telephone Encounter (Signed)
Spoke with [name] the pts daughter on DPR in re: to a message left a few days ago from his in home OT re: the pt having a low resting HR in the 40's.   She reports that it has been consistently staying in the 40's at rest but in the 70's with exertion. He is not c/o dizziness, SOB, no presyncope.. BP stays near 140/70.   She states he started seizure meds on 09/25/20.... Dilantin and Seroquel and attributes the low Hr to the new meds possibly but he has also started metoprolol 08/2020.   I will foward to Dr. Jenetta DownerNori Riis for review and any possible recommendations.   I advised her to continue to monitor. To be sure he is hydrating well. To call if any changes.

## 2020-10-16 NOTE — Telephone Encounter (Signed)
If he is not having any symptoms I recommend to continue the metoprolol.  We will make sure to get an EKG when I see him in the office.  He was having PVCs at one point and he may be having more of these.  These can cause the heart rate to be artificially low.  As long as he is having no symptoms that he needs to be done.  We will discuss further when I see him in the office.  Almyra Free: He will need an EKG when he sees [place] in the office.  Lake Bells T. Audie Box, MD, [place]  [address], [place], [place] [address] [phone]  1:34 PM

## 2020-10-17 NOTE — Telephone Encounter (Signed)
Called and spoke to daughter- advised of message.  Scheduled patient to be seen next week 03/23- EKG will be preformed at that visit.  Daughter verbalized understanding.

## 2020-10-19 ENCOUNTER — Other Ambulatory Visit (INDEPENDENT_AMBULATORY_CARE_PROVIDER_SITE_OTHER): Payer: Medicare Other

## 2020-10-19 DIAGNOSIS — K5900 Constipation, unspecified: Secondary | ICD-10-CM | POA: Diagnosis not present

## 2020-10-19 DIAGNOSIS — Z86010 Personal history of colonic polyps: Secondary | ICD-10-CM | POA: Diagnosis not present

## 2020-10-19 DIAGNOSIS — Z8673 Personal history of transient ischemic attack (TIA), and cerebral infarction without residual deficits: Secondary | ICD-10-CM | POA: Diagnosis not present

## 2020-10-19 DIAGNOSIS — D649 Anemia, unspecified: Secondary | ICD-10-CM

## 2020-10-19 LAB — BASIC METABOLIC PANEL
BUN: 15 mg/dL (ref 6–23)
CO2: 23 mEq/L (ref 19–32)
Calcium: 8.9 mg/dL (ref 8.4–10.5)
Chloride: 107 mEq/L (ref 96–112)
Creatinine, Ser: 1.18 mg/dL (ref 0.40–1.50)
GFR: 59.68 mL/min — ABNORMAL LOW (ref >60.00)
Glucose, Bld: 76 mg/dL (ref 70–99)
Potassium: 3.7 mEq/L (ref 3.5–5.1)
Sodium: 140 mEq/L (ref 135–145)

## 2020-10-19 LAB — CBC
HCT: 32.4 % — ABNORMAL LOW (ref 39.0–52.0)
Hemoglobin: 10.6 g/dL — ABNORMAL LOW (ref 13.0–17.0)
MCHC: 32.6 g/dL (ref 30.0–36.0)
MCV: 88.2 fl (ref 78.0–100.0)
Platelets: 237 10*3/uL (ref 150.0–400.0)
RBC: 3.67 Mil/uL — ABNORMAL LOW (ref 4.22–5.81)
RDW: 18.2 % — ABNORMAL HIGH (ref 11.5–15.5)
WBC: 8.5 10*3/uL (ref 4.0–10.5)

## 2020-10-19 LAB — PROTIME-INR
INR: 1.1 ratio — ABNORMAL HIGH (ref 0.8–1.0)
Prothrombin Time: 12.7 s (ref 9.6–13.1)

## 2020-10-19 LAB — IBC + FERRITIN
Ferritin: 89.5 ng/mL (ref 22.0–322.0)
Iron: 44 ug/dL (ref 42–165)
Saturation Ratios: 18.7 % — ABNORMAL LOW (ref 20.0–50.0)
Transferrin: 168 mg/dL — ABNORMAL LOW (ref 212.0–360.0)

## 2020-10-19 LAB — FOLATE: Folate: 5.3 ng/mL — ABNORMAL LOW (ref >5.9)

## 2020-10-19 LAB — VITAMIN B12: Vitamin B-12: 295 pg/mL (ref 211–911)

## 2020-10-22 ENCOUNTER — Other Ambulatory Visit: Payer: Self-pay

## 2020-10-22 DIAGNOSIS — D649 Anemia, unspecified: Secondary | ICD-10-CM

## 2020-10-22 MED ORDER — FOLIC ACID 1 MG PO TABS: 2.0000 mg | ORAL_TABLET | Freq: Every day | ORAL | 0 refills | Status: AC

## 2020-10-22 MED ORDER — IRON 325 (65 FE) MG PO TABS: 325.0000 mg | ORAL_TABLET | Freq: Two times a day (BID) | ORAL | 0 refills | Status: DC

## 2020-10-22 NOTE — Progress Notes (Signed)
Cardiology Office Note:   Date:  10/24/2020  NAME:  Andres White    MRN: [id number] DOB:  [birth year]   PCP:  [name], MD  Cardiologist:  No primary care provider on file.  Electrophysiologist:  None   Referring MD: [name],*   Chief Complaint  Patient presents with  . Bradycardia   History of Present Illness:   Andres White is a 75 y.o. male with a hx of stroke, hypertension, tobacco abuse, hyperlipidemia, carotid stenosis who presents for follow-up.  Evaluated in January for ventricular tachycardia seen on monitor.  Nuclear medicine stress test normal.  Echocardiogram normal.  Heart rate has been low and the patient is following up for bradycardia today.  He presents with his daughter.  He was rehospitalized in February for seizures related to his prior stroke.  He has been working with physical therapy.  Heart rate has been low.  When I saw him several months ago he had low blood pressure.  We stopped his BP medications and put him on metoprolol for PVC suppression.  BP now high and heart rate a bit too low.  He is working with physical therapy but not active.  He also has some lower extremity edema.  He also has some skin breakdown in the right leg.  This looks like venous insufficiency to me.  He does need DVT studies.  I have recommended to stop his metoprolol and start lisinopril-HCTZ.  Have also recommended compression stockings and salt reductive strategies.  His EKG in office demonstrates sinus bradycardia with heart rate 52 without acute ischemic changes.  There is an old anteroseptal infarct.  All of his stress test and other tests were normal.  He denies any chest pain in the office today.  BP 150/80.   Problem List 1. HTN 2. 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 Tobacco abuse 4. HLD -T chol 79, HDL 44, LDL 19, triglycerides 81 -A1c 4.5 5. Carotid Artery Disease -L ICA 70-99% (overestimated) 6. Ventricular tachycardia vs  SVT with aberrancy  -captured on monitor; no symptoms; monomorphic  -EF 55-60% 7. 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  Dementia   Past Medical History: Past Medical History:  Diagnosis Date  . Arthritis   . Chronic back pain   . Duodenal adenoma   . Gout   . Hypertension   . Stroke ([place]) 06/2020  . Syncope and collapse     Past Surgical History: Past Surgical History:  Procedure Laterality Date  . BIOPSY  07/13/2020   Procedure: BIOPSY;  Surgeon: Ladene Artist, MD;  Location[facility] ENDOSCOPY;  Service: Endoscopy;;  . CARDIAC CATHETERIZATION  2015   [facility]   . COLONOSCOPY WITH PROPOFOL N/A 07/13/2020   Procedure: COLONOSCOPY WITH PROPOFOL;  Surgeon: Ladene Artist, MD;  Location: [facility] ENDOSCOPY;  Service: Endoscopy;  Laterality: N/A;  . ESOPHAGOGASTRODUODENOSCOPY N/A 07/13/2020   Procedure: ESOPHAGOGASTRODUODENOSCOPY (EGD);  Surgeon: Ladene Artist, MD;  Location: [facility] ENDOSCOPY;  Service: Endoscopy;  Laterality: N/A;  . KNEE SURGERY Right   . POLYPECTOMY  07/13/2020   Procedure: POLYPECTOMY;  Surgeon: Ladene Artist, MD;  Location: [facility] ENDOSCOPY;  Service: Endoscopy;;    Current Medications: Current Meds  Medication Sig  . acetaminophen (TYLENOL) 325 MG tablet Take 1-2 tablets (325-650 mg total) by mouth every 4 (four) hours as needed for mild pain.  [name] aspirin EC 81 MG tablet Take 1 tablet (81 mg total) by mouth daily.  [name] atorvastatin (LIPITOR) 40  MG tablet Take 40 mg by mouth daily.  . citalopram (CELEXA) 20 MG tablet Take 1 tablet (20 mg total) by mouth daily.  . colchicine 0.6 MG tablet Take 0.6 mg by mouth daily.  . diclofenac Sodium (VOLTAREN) 1 % GEL Apply 2 g topically 4 (four) times daily.  . Ferrous Sulfate (IRON) 325 (65 Fe) MG TABS Take 1 tablet (325 mg total) by mouth 2 (two) times daily before a meal.  . folic acid (FOLVITE) 1 MG tablet Take 2 tablets (2 mg total) by mouth daily.  [name] gabapentin (NEURONTIN) 100 MG capsule Take 1 capsule (100 mg total) by mouth 2  (two) times daily.  . hydrocerin (EUCERIN) CREA Apply 1 application topically 2 (two) times daily.  [name] levETIRAcetam (KEPPRA) 500 MG tablet Take 1 tablet (500 mg total) by mouth 2 (two) times daily.  [name] lisinopril-hydrochlorothiazide (ZESTORETIC) 20-12.5 MG tablet Take 1 tablet by mouth daily.  . Multiple Vitamin (MULTIVITAMIN WITH MINERALS) TABS tablet Take 1 tablet by mouth daily.  . Na Sulfate-K Sulfate-Mg Sulf (SUPREP BOWEL PREP KIT) 17.5-3.13-1.6 GM/177ML SOLN Take 1 kit by mouth as directed. For colonoscopy prep  . pantoprazole (PROTONIX) 40 MG tablet TAKE 1 TABLET(40 MG) BY MOUTH TWICE DAILY  . phenytoin (DILANTIN) 100 MG ER capsule Take 1 capsule (100 mg total) by mouth 3 (three) times daily.  . QUEtiapine (SEROQUEL) 50 MG tablet Take 1 tablet (50 mg total) by mouth at bedtime as needed.  . tamsulosin (FLOMAX) 0.4 MG CAPS capsule Take 1 capsule (0.4 mg total) by mouth daily.  . [DISCONTINUED] metoprolol succinate (TOPROL-XL) 25 MG 24 hr tablet Take 1 tablet (25 mg total) by mouth daily. Take with or immediately following a meal.     Allergies:    Patient has no known allergies.   Social History: Social History   Socioeconomic History  . Marital status: Single    Spouse name: Not on file  . Number of children: Not on file  . Years of education: Not on file  . Highest education level: Not on file  Occupational History  . Not on file  Tobacco Use  . Smoking status: Current Every Day Smoker    Years: 20.00    Types: Cigarettes  . Smokeless tobacco: Never Used  Vaping Use  . Vaping Use: Never used  Substance and Sexual Activity  . Alcohol use: Not Currently    Comment: occasional  . Drug use: No  . Sexual activity: Not on file  Other Topics Concern  . Not on file  Social History Narrative   Lives with daughter, son in law and 2 grandchildren   Right Handed   Drinks 1 cup caffeine 4 times a week-ish   Social Determinants of Health   Financial Resource Strain: Not on  file  Food Insecurity: Not on file  Transportation Needs: Not on file  Physical Activity: Not on file  Stress: Not on file  Social Connections: Not on file     Family History: The patient's family history includes Hypertension in his mother. There is no history of Colon cancer, Pancreatic cancer, Stomach cancer, Esophageal cancer, Inflammatory bowel disease, Liver disease, or Rectal cancer.  ROS:   All other ROS reviewed and negative. Pertinent positives noted in the HPI.     EKGs/Labs/Other Studies Reviewed:   The following studies were personally reviewed by me today:  EKG:  EKG is ordered today.  The ekg ordered today demonstrates sinus bradycardia heart rate 52, old anteroseptal infarct,  and was personally reviewed by me.   TTE 06/25/2020 1. Left ventricular ejection fraction, by estimation, is 60 to 65%. The  left ventricle has normal function. The left ventricle has no regional  wall motion abnormalities. There is severe concentric left ventricular  hypertrophy. Left ventricular diastolic  parameters are consistent with Grade I diastolic dysfunction (impaired  relaxation).  2. Right ventricular systolic function is normal. The right ventricular  size is normal.  3. The mitral valve is normal in structure. Mild mitral valve  regurgitation. No evidence of mitral stenosis.  4. The aortic valve is normal in structure. Aortic valve regurgitation is  not visualized. No aortic stenosis is present.  5. The inferior vena cava is normal in size with greater than 50%  respiratory variability, suggesting right atrial pressure of 3 mmHg.   NM Stress 08/08/2020  Nuclear stress EF: 59%.  The left ventricular ejection fraction is normal (55-65%).  The study is normal.  This is a low risk study.   Normal resting and stress perfusion. No ischemia or infarction EF 59%  Zio 08/08/2020  Patient was monitored for 14 days.  The predominant rhythm was sinus with an average rate  of 63 bpm (range 39-146 bpm in sinus).  There were rare PACs and occasional PVCs noted. 158 episodes of wide-complex tachycardia occurred (favor ventricular tachycardia over SVT with aberrancy). Most episodes were brief, though the longest one lasted 39 seconds. Maximum rate of VT was 187 bpm.  A single atrial run lasting 6 beats was observed with a maximum rate of 103 bpm.  No prolonged pause was identified.  There were no patient triggered events.   Predominately sinus rhythm with multiple episodes of NSVT as well as one episode of sustained VT lasting 39 seconds.  Findings previously discussed with patient's family.  Cardiology evaluation for these results has already occurred.  Recent Labs: 08/30/2020: TSH 3.110 09/19/2020: ALT 14 09/20/2020: Magnesium 1.7 10/19/2020: BUN 15; Creatinine, Ser 1.18; Hemoglobin 10.6; Platelets 237.0; Potassium 3.7; Sodium 140   Recent Lipid Panel    Component Value Date/Time   CHOL 79 09/18/2020 0616   TRIG 81 09/18/2020 0616   HDL 44 09/18/2020 0616   CHOLHDL 1.8 09/18/2020 0616   VLDL 16 09/18/2020 0616   LDLCALC 19 09/18/2020 0616    Physical Exam:   VS:  BP (!) 150/80   Pulse (!) 52   Ht 5' 7"  (1.702 m)   Wt 188 lb 6.4 oz (85.5 kg)   BMI 29.51 kg/m    Wt Readings from Last 3 Encounters:  10/24/20 188 lb 6.4 oz (85.5 kg)  09/28/20 176 lb 9.6 oz (80.1 kg)  09/25/20 179 lb 0.2 oz (81.2 kg)    General: Well nourished, well developed, in no acute distress Head: Atraumatic, normal size  Eyes: PEERLA, EOMI  Neck: Supple, no JVD Endocrine: No thryomegaly Cardiac: Normal S1, S2; RRR; no murmurs, rubs, or gallops Lungs: Clear to auscultation bilaterally, no wheezing, rhonchi or rales  Abd: Soft, nontender, no hepatomegaly  Ext: 1+ pitting edema, chronic venous insufficiency changes noted in the right lower extremity Musculoskeletal: No deformities, BUE and BLE strength normal and equal Skin: Warm and dry, no rashes   Neuro: Alert and  oriented to person, place, time, and situation, CNII-XII grossly intact, no focal deficits  Psych: Normal mood and affect   ASSESSMENT:   QUINTRELL BAZE is a 75 y.o. male who presents for the following: 1. Bradycardia   2. Leg edema  3. Ventricular tachycardia ([place])   4. Lacunar infarction ([place])   5. Left carotid stenosis   6. Mixed hyperlipidemia   7. Primary hypertension     PLAN:   1. Bradycardia -Suspect this is secondary to metoprolol.  Some mild dizziness with this.  We will stop his metoprolol.  Recent TSH 3.1.  I think this is the cause of this.  His recent echo was normal.  Recent stress test normal as well.  EKG in office demonstrates sinus bradycardia with old anteroseptal infarct which is unchanged.  2. Leg edema -Appears to be venous insufficiency changes.  No evidence of gross volume overload.  We will put him on lisinopril and HCTZ combination tablet.  This should help.  I also recommended venous duplexes to exclude DVT.  It is bilateral suspect this is just venous insufficiency related.  I recommended compression stockings as well as leg elevation.  We will give him information about salt reduction. -Inactivity also appears to be an issue.  He does have dementia but things are going well per his daughter.  3. Ventricular tachycardia ([facility]) -Ventricular tachycardia versus SVT with aberrant conduction on a monitor recently.  Recent echo shows normal LV function.  Normal nuclear medicine stress test.  I suspect this was all apparent conduction.  We are stopping his beta-blocker due to bradycardia.  No strong indication for this.  4. Lacunar infarction ([place]) -Lacunar infarcts.  Small vessel intracranial atherosclerosis.  Continue aspirin and Lipitor.  He will need to be followed by neurology for stroke and seizure disorder.  5. Left carotid stenosis -Overestimated on recent CT.  No significant bruit.  Continue to monitor.  6. Mixed hyperlipidemia -Most recent LDL 19.   Continue Lipitor.  7. Primary hypertension -Stop metoprolol.  Start lisinopril and HCTZ as above.   Disposition: Return in about 3 months (around 01/24/2021).  Medication Adjustments/Labs and Tests Ordered: Current medicines are reviewed at length with the patient today.  Concerns regarding medicines are outlined above.  Orders Placed This Encounter  Procedures  . EKG 12-Lead  . VAS [place] LOWER EXTREMITY VENOUS (DVT)   Meds ordered this encounter  Medications  . lisinopril-hydrochlorothiazide (ZESTORETIC) 20-12.5 MG tablet    Sig: Take 1 tablet by mouth daily.    Dispense:  90 tablet    Refill:  1    Patient Instructions  Medication Instructions:  Stop Metoprolol Start Lisinoprol-HCTZ 20-12.5 mg daily   *If you need a refill on your cardiac medications before your next appointment, please call your pharmacy*  Testing/Procedures: VENOUS US LOWER EXTREMITY    Follow-Up: At [facility], you and your health needs are our priority.  As part of our continuing mission to provide you with exceptional heart care, we have created designated Provider Care Teams.  These Care Teams include your primary Cardiologist (physician) and Advanced Practice Providers (APPs -  Physician Assistants and Nurse Practitioners) who all work together to provide you with the care you need, when you need it.  We recommend signing up for the patient portal called "MyChart".  Sign up information is provided on this After Visit Summary.  MyChart is used to connect with patients for Virtual Visits (Telemedicine).  Patients are able to view lab/test results, encounter notes, upcoming appointments, etc.  Non-urgent messages can be sent to your provider as well.   To learn more about what you can do with MyChart, go to [email or web address].    Your next appointment:   3 month(s)  The format for  your next appointment:   In Person  Provider:   Eleonore Chiquito, MD   Other Instructions   How to Use  Compression Stockings Compression stockings are elastic socks that squeeze the legs. They help increase blood flow (circulation) to the legs, decrease swelling in the legs, and reduce the chance of developing blood clots in the lower legs. Compression stockings are often used by people who:  Are recovering from surgery.  Have poor circulation in their legs.  Tend to get blood clots in their legs.  Have bulging (varicose) veins.  Sit or stay in bed for long periods of time. Follow instructions from your health care provider about how and when to wear your compression stockings. How to wear compression stockings Before you put on your compression stockings:  Make sure that they are the correct size and degree of compression. If you do not know your size or required grade of compression, ask your health care provider and follow the manufacturer's instructions that come with the stockings.  Make sure that they are clean, dry, and in good condition.  Check them for rips and tears. Do not put them on if they are ripped or torn. Put your stockings on first thing in the morning, before you get out of bed. Keep them on for as long as your health care provider advises. When you are wearing your stockings:  Keep them as smooth as possible. Do not allow them to bunch up. It is especially important to prevent the stockings from bunching up around your toes or behind your knees.  Do not roll the stockings downward and leave them rolled down. This can decrease blood flow to your leg.  Change them right away if they become wet or dirty. When you take off your stockings, inspect your legs and feet. Check for:  Open sores.  Red spots.  Swelling. General tips  Do not stop wearing compression stockings without talking to your health care provider first.  Wash your stockings every day with mild detergent in cold or warm water. Do not use bleach. Air-dry your stockings or dry them in a clothes dryer  on low heat. It may be helpful to have two pairs so that you have a pair to wear while the other is being washed.  Replace your stockings every 3-6 months.  If skin moisturizing is part of your treatment plan, apply lotion or cream at night so that your skin will be dry when you put on the stockings in the morning. It is harder to put the stockings on when you have lotion on your legs or feet.  Wear nonskid shoes or slip-resistant socks when walking while wearing compression stockings. Contact a health care provider and remove your stockings if you have:  A feeling of pins and needles in your feet or legs.  Open sores, red spots, or other skin changes on your feet or legs.  Swelling or pain that gets worse. Get help right away if you have:  Numbness or tingling in your lower legs that does not get better right after you take the stockings off.  Toes or feet that are unusually cold or turn a bluish color.  A warm or red area on your leg.  New swelling or soreness in your leg.  Shortness of breath.  Chest pain.  A fast or irregular heartbeat.  Light-headedness.  Dizziness. Summary  Compression stockings are elastic socks that squeeze the legs.  They help increase blood flow (circulation) to the  legs, decrease swelling in the legs, and reduce the chance of developing blood clots in the lower legs.  Follow instructions from your health care provider about how and when to wear your compression stockings.  Do not stop wearing your compression stockings without talking to your health care provider first. This information is not intended to replace advice given to you by your health care provider. Make sure you discuss any questions you have with your health care provider. Document Revised: 11/23/2019 Document Reviewed: 11/23/2019 Elsevier Patient Education  2021 [place] with Patient: I have spent a total of 25 minutes with patient reviewing hospital  notes, telemetry, EKGs, labs and examining the patient as well as establishing an assessment and plan that was discussed with the patient.  > 50% of time was spent in direct patient care.  Signed, Addison Naegeli. Audie Box, MD, [facility]  [address] [place], [place] [address] [phone]  10/24/2020 1:06 PM

## 2020-10-23 ENCOUNTER — Ambulatory Visit: Payer: Medicare Other | Admitting: Physical Medicine and Rehabilitation

## 2020-10-24 ENCOUNTER — Ambulatory Visit (INDEPENDENT_AMBULATORY_CARE_PROVIDER_SITE_OTHER): Payer: Medicare Other | Admitting: Cardiovascular Disease

## 2020-10-24 ENCOUNTER — Encounter: Payer: Self-pay | Admitting: Cardiovascular Disease

## 2020-10-24 ENCOUNTER — Other Ambulatory Visit: Payer: Self-pay

## 2020-10-24 ENCOUNTER — Telehealth: Payer: Self-pay

## 2020-10-24 ENCOUNTER — Telehealth: Payer: Self-pay | Admitting: Neurology

## 2020-10-24 VITALS — BP 150/80 | HR 52 | Ht 67.0 in | Wt 188.4 lb

## 2020-10-24 DIAGNOSIS — R600 Localized edema: Secondary | ICD-10-CM

## 2020-10-24 DIAGNOSIS — R001 Bradycardia, unspecified: Secondary | ICD-10-CM | POA: Diagnosis not present

## 2020-10-24 DIAGNOSIS — I6522 Occlusion and stenosis of left carotid artery: Secondary | ICD-10-CM

## 2020-10-24 DIAGNOSIS — G40901 Epilepsy, unspecified, not intractable, with status epilepticus: Secondary | ICD-10-CM

## 2020-10-24 DIAGNOSIS — E782 Mixed hyperlipidemia: Secondary | ICD-10-CM

## 2020-10-24 DIAGNOSIS — I472 Ventricular tachycardia, unspecified: Secondary | ICD-10-CM

## 2020-10-24 DIAGNOSIS — I6381 Other cerebral infarction due to occlusion or stenosis of small artery: Secondary | ICD-10-CM | POA: Diagnosis not present

## 2020-10-24 DIAGNOSIS — I10 Essential (primary) hypertension: Secondary | ICD-10-CM

## 2020-10-24 MED ORDER — LISINOPRIL-HYDROCHLOROTHIAZIDE 20-12.5 MG PO TABS: 1.0000 | ORAL_TABLET | Freq: Every day | ORAL | 1 refills | Status: DC

## 2020-10-24 MED ORDER — QUETIAPINE FUMARATE 50 MG PO TABS: 50.0000 mg | ORAL_TABLET | Freq: Every evening | ORAL | 1 refills | Status: DC | PRN

## 2020-10-24 MED ORDER — LEVETIRACETAM 500 MG PO TABS: 500.0000 mg | ORAL_TABLET | Freq: Two times a day (BID) | ORAL | 1 refills | Status: DC

## 2020-10-24 MED ORDER — PHENYTOIN SODIUM EXTENDED 100 MG PO CAPS: 100.0000 mg | ORAL_CAPSULE | Freq: Three times a day (TID) | ORAL | 1 refills | Status: DC

## 2020-10-24 NOTE — Telephone Encounter (Signed)
Pt.'s daughter [name] states they were supposed to receive a call for dad to continue seizure medications but never received one. She states dad went to PCP & he wants him to be seen sooner than May appt. [name] would like a call back regarding concerns. Please advise.

## 2020-10-24 NOTE — Addendum Note (Signed)
Addended by: Belinda Block A on: 10/24/2020 04:18 PM   Modules accepted: Orders

## 2020-10-24 NOTE — Telephone Encounter (Signed)
I spoke to [place] and she states that her father was hospitalized last month for seizures and had to be put in an induced coma because the seizures were continuous. He was discharged on 09/24/20 on Dilantin 100mg  TID, Keppra 500mg  BID, and Seroquel 50mg  at bedtime. He is out of his medications as of today and needs refills sent to the pharmacy. She says that they also diagnosed him with dementia. He is scheduled for a follow up in May, would you like to see him sooner than that?

## 2020-10-24 NOTE — Telephone Encounter (Signed)
Learta Codding, Speech Therapist with [facility] called seeking verbal orders. [facility] would like to extend speech. For an additional 5 weeks,one time a week. To start November 04, 2020.  Call back ph#  [phone] Milana Kidney..   Please advise if approved.

## 2020-10-24 NOTE — Telephone Encounter (Signed)
Okay to renew the patient's seizure medications and he needs to see the next available physician for seizure management as a new consult

## 2020-10-24 NOTE — Telephone Encounter (Signed)
Approved.  

## 2020-10-24 NOTE — Telephone Encounter (Signed)
I spoke to [facility] and made her aware that we would try and get the patient in sooner than May for a Hosp f/u for seizures. [name] was very Patent attorney.

## 2020-10-24 NOTE — Telephone Encounter (Signed)
Message left for approval.

## 2020-10-24 NOTE — Progress Notes (Signed)
Subjective:    Patient ID: Andres White, male    DOB: [birth year], 75 y.o.   MRN: [id number]  HPI:  1) Acute brainstem infarct, sequelae: - Andres White is a 75 y.o. male who is here for follow-up appointment of his Brainstem Infarct acute, Right Leg weakness, occult blood in stools,  and left carotid stenosis. Andres White went to [facility] on 06/24/2020 for right leg weakness. Neurology consulted.   -CT Head WO Contrast:  IMPRESSION: No acute intracranial hemorrhage or mass effect. Multiple chronic infarcts and moderate chronic microvascular ischemic changes. Age indeterminate though probably chronic infarct of the left thalamus. MR Brain WO Contrast:  IMPRESSION: Small acute infarcts of the left midbrain.  Several chronic infarcts as described. Moderate chronic microvascular ischemic changes.  US Carotid Bilateral:  IMPRESSION: Right:  Color duplex indicates moderate heterogeneous and calcified plaque, with no hemodynamically significant stenosis by duplex criteria in the extracranial cerebrovascular circulation.  Left:  Heterogeneous and partially calcified plaque at the left carotid bifurcation, with discordant results regarding degree of stenosis by established duplex criteria. Peak velocity suggests 70% - 99% stenosis, with the ICA/ CCA ratio suggesting a lesser degree of stenosis. If establishing a more accurate degree of stenosis is required, cerebral angiogram should be considered, or as a second best test, CTA. Per Discharge Summary: Reesa Chew PA-C: Neurology recommended aspirin and statin for secondary stroke prevention.  Cardiology was consulted , he had bouts of asymptomatic NSVT, cardiology following as outpatient.   Andres White was admitted to inpatient rehabilitation on 06/27/2020 and he was discharged to daughter's home on 07/18/2020. He's receiving outpatient therapy at [place]. He denies any pain at this time. He rated his pain on his  Health and History 3. Also reports he has a good appetite.  Daughter in room, all questions answered.  Heather Andres White daughter has established PCP care for him in [place].   Current mobility: Able to ambulated very limited distances due to his pain  2) Left carotid stenosis -Has followed up with cardiology.   4) Occult blood in stool -Hgb 10.6 on 3/23. Discussed with patient and daughter.   5) Intermittent, tingling, and aching pain: -increase Gabapentin at night  6) Hypertension -has been better control at home but he was in a lot of pain walking to the clinic today  7) Overweight: weight 188 lbs, BMI 179/81  8) Gout: currently having a flare with 10/10 pain- taking one of colchicine daily without benefit. Does not drink alcohol. Eats meat.  9) Active smoker: Smokes 10 cigarettes per day. Would like to stop.    Pain Inventory Average Pain 3 Pain Right Now 8 My pain is intermittent, sharp and aching  LOCATION OF PAIN  Right wrist, right hand, right leg & right ankle.  BOWEL Number of stools per week: 2 Oral laxative use No  Type of laxative None Enema or suppository use No  History of colostomy No  Incontinent No   BLADDER Normal and Pads In and out cath, frequency N/A Able to self cath No  Bladder incontinence Yes  Frequent urination Yes  Leakage with coughing No  Difficulty starting stream No  Incomplete bladder emptying Yes    Mobility walk with assistance use a walker how many minutes can you walk? 3 MINS ability to climb steps?  yes do you drive?  no use a wheelchair Do you have any goals in this area?  yes  Function retired I need assistance with the  following:  feeding, dressing, bathing, toileting, meal prep, household duties and shopping Do you have any goals in this area?  yes  Neuro/Psych bladder control problems bowel control problems weakness numbness trouble walking confusion anxiety  Prior Studies Any changes since last  visit?  yes MRI in [place]  Physicians involved in your care Any changes since last visit?  no   Family History  Problem Relation Age of Onset  . Hypertension Mother   . Colon cancer Neg Hx   . Pancreatic cancer Neg Hx   . Stomach cancer Neg Hx   . Esophageal cancer Neg Hx   . Inflammatory bowel disease Neg Hx   . Liver disease Neg Hx   . Rectal cancer Neg Hx    Social History   Socioeconomic History  . Marital status: Single    Spouse name: Not on file  . Number of children: Not on file  . Years of education: Not on file  . Highest education level: Not on file  Occupational History  . Not on file  Tobacco Use  . Smoking status: Current Every Day Smoker    Years: 20.00    Types: Cigarettes  . Smokeless tobacco: Never Used  Vaping Use  . Vaping Use: Never used  Substance and Sexual Activity  . Alcohol use: Not Currently    Comment: occasional  . Drug use: No  . Sexual activity: Not on file  Other Topics Concern  . Not on file  Social History Narrative   Lives with daughter, son in law and 2 grandchildren   Right Handed   Drinks 1 cup caffeine 4 times a week-ish   Social Determinants of Health   Financial Resource Strain: Not on file  Food Insecurity: Not on file  Transportation Needs: Not on file  Physical Activity: Not on file  Stress: Not on file  Social Connections: Not on file   Past Surgical History:  Procedure Laterality Date  . BIOPSY  07/13/2020   Procedure: BIOPSY;  Surgeon: Ladene Artist, MD;  Location: [facility] ENDOSCOPY;  Service: Endoscopy;;  . CARDIAC CATHETERIZATION  2015   [facility]   . COLONOSCOPY WITH PROPOFOL N/A 07/13/2020   Procedure: COLONOSCOPY WITH PROPOFOL;  Surgeon: Ladene Artist, MD;  Location: [facility] ENDOSCOPY;  Service: Endoscopy;  Laterality: N/A;  . ESOPHAGOGASTRODUODENOSCOPY N/A 07/13/2020   Procedure: ESOPHAGOGASTRODUODENOSCOPY (EGD);  Surgeon: Ladene Artist, MD;  Location: [facility] ENDOSCOPY;  Service: Endoscopy;  Laterality:  N/A;  . KNEE SURGERY Right   . POLYPECTOMY  07/13/2020   Procedure: POLYPECTOMY;  Surgeon: Ladene Artist, MD;  Location: [facility] ENDOSCOPY;  Service: Endoscopy;;   Past Medical History:  Diagnosis Date  . Arthritis   . Chronic back pain   . Duodenal adenoma   . Gout   . Hypertension   . Stroke ([place]) 06/2020  . Syncope and collapse    There were no vitals taken for this visit.  Opioid Risk Score:   Fall Risk Score:  `1  Depression screen PHQ 2/9  Depression screen PHQ 2/9 09/14/2020  Decreased Interest 2  Down, Depressed, Hopeless 0  PHQ - 2 Score 2  Altered sleeping 2  Tired, decreased energy 2  Change in appetite 0  Feeling bad or failure about yourself  1  Trouble concentrating 1  Moving slowly or fidgety/restless 1  Suicidal thoughts 0  PHQ-9 Score 9   Review of Systems  Respiratory: Positive for shortness of breath.   Gastrointestinal: Positive for constipation.  Genitourinary: Positive for frequency.  Musculoskeletal: Positive for gait problem.       Right sided pain & weakness  Neurological: Positive for tremors, weakness and numbness.  Psychiatric/Behavioral: Positive for confusion.       Anxiety  All other systems reviewed and are negative.      Objective:    Physical Exam Gen: no distress, normal appearing HEENT: oral mucosa pink and moist, NCAT Cardio: Reg rate Chest: normal effort, normal rate of breathing Abd: soft, non-distended Ext: no edema Psych: pleasant, normal affect Skin: intact Musculoskeletal:     Cervical back: Normal range of motion and neck supple.     Comments: Normal Muscle Bulk and Muscle Testing Reveals:  Upper Extremities: Full ROM and Muscle Strength on Right 3/5 and Left 5/5 Lower Extremities: Full ROM and Muscle Strength 5/5 Arises from Table slowly using walker for support Narrow Based  Gait      Assessment & Plan:  1.Brainstem Infarct acute:Right Leg Weakness: Continue outpatient Therapy with [facility].  Neurology Following. Continue to monitor.  2.Occult Blood in Stools: Gastroenterology Following Dr Fuller Plan.  He's scheduled for Colonoscopy. Hgb reviewed and was 10.6 on 3/18. Continue to monitor.  3.Left carotid stenosis.Cardiology Following: Continue cardiology follow-up  4. Neuropathy: 5. Right leg weakness: Provided with handicap placard 6. Hypertension HTN: -BP is 179/81 today, higher than usual as per daughter since he is in pain from walking to clinic  -Advised checking BP daily at home and logging results to bring into follow-up appointment with her PCP and myself. -Advised regarding healthy foods that can help lower blood pressure and provided with a list: 1) citrus foods 2) salmon and other fatty fish 3) swiss chard (leafy green) 4) pumpkin seeds 5) Beans and lentils 6) Berries 7) Amaranth (whole grain, can be cooked similarly to rice and oats) 8) Pistachios 9) Carrots 10) Celery 11) Tomatoes 12) Broccoli 13) Greek yogurt 14) Herbs and spices: Celery seed, cilantro, saffron, lemongrass, black cumin, ginseng, cinnamon, cardamom, sweet basil, and ginger 15) Chia and flax seeds 16) Beets 17) spinach -Educated that goal BP is 120/80. -Made goal to incorporate some of the above foods into her diet.  7. Overweight, weight 188 lbs, BMI 29.51.  -Discussed foods that can assist in weight loss:  1) Eggs  2) Leafy greens  3) Salmon  4) Cruciferous vegetables  5) Lean beef and chicken breast  6) Boiled potatoes  7) Tuna  8) Beans and legumes  9) Soups  10) Cottage cheese  11) Avocados  12) Apple cider vinegar  13) Nuts  14) Whole grains  15) Chili pepper  16) Fruit- berries are some of the best  17) Grapefruit  18) Chia seeds  19) Coconut oil  20) Full-fat yogurt  8. PAD: referred to vascular clinic 9. 7 minutes spent in smoking cessation couseling: prescribed chantix. Discussed benefits of stopping cold [place] but daughter did not feel he could do this. Discussed  patch, but he did not have good benefit in the past. Can consider bupropion if chantix does not help. Made goal to decrease to 9 cigarettes today, and to decrease by 1 cigarette each week. Counseled regarding risks of lung cancer, PAD, COPD, and in harming family members via secondhand smoke

## 2020-10-24 NOTE — Patient Instructions (Signed)
Medication Instructions:  Stop Metoprolol Start Lisinoprol-HCTZ 20-12.5 mg daily   *If you need a refill on your cardiac medications before your next appointment, please call your pharmacy*  Testing/Procedures: VENOUS US LOWER EXTREMITY    Follow-Up: At [facility], you and your health needs are our priority.  As part of our continuing mission to provide you with exceptional heart care, we have created designated Provider Care Teams.  These Care Teams include your primary Cardiologist (physician) and Advanced Practice Providers (APPs -  Physician Assistants and Nurse Practitioners) who all work together to provide you with the care you need, when you need it.  We recommend signing up for the patient portal called "MyChart".  Sign up information is provided on this After Visit Summary.  MyChart is used to connect with patients for Virtual Visits (Telemedicine).  Patients are able to view lab/test results, encounter notes, upcoming appointments, etc.  Non-urgent messages can be sent to your provider as well.   To learn more about what you can do with MyChart, go to [email or web address].    Your next appointment:   3 month(s)  The format for your next appointment:   In Person  Provider:   Eleonore Chiquito, MD   Other Instructions   How to Use Compression Stockings Compression stockings are elastic socks that squeeze the legs. They help increase blood flow (circulation) to the legs, decrease swelling in the legs, and reduce the chance of developing blood clots in the lower legs. Compression stockings are often used by people who:  Are recovering from surgery.  Have poor circulation in their legs.  Tend to get blood clots in their legs.  Have bulging (varicose) veins.  Sit or stay in bed for long periods of time. Follow instructions from your health care provider about how and when to wear your compression stockings. How to wear compression stockings Before you put on your  compression stockings:  Make sure that they are the correct size and degree of compression. If you do not know your size or required grade of compression, ask your health care provider and follow the manufacturer's instructions that come with the stockings.  Make sure that they are clean, dry, and in good condition.  Check them for rips and tears. Do not put them on if they are ripped or torn. Put your stockings on first thing in the morning, before you get out of bed. Keep them on for as long as your health care provider advises. When you are wearing your stockings:  Keep them as smooth as possible. Do not allow them to bunch up. It is especially important to prevent the stockings from bunching up around your toes or behind your knees.  Do not roll the stockings downward and leave them rolled down. This can decrease blood flow to your leg.  Change them right away if they become wet or dirty. When you take off your stockings, inspect your legs and feet. Check for:  Open sores.  Red spots.  Swelling. General tips  Do not stop wearing compression stockings without talking to your health care provider first.  Wash your stockings every day with mild detergent in cold or warm water. Do not use bleach. Air-dry your stockings or dry them in a clothes dryer on low heat. It may be helpful to have two pairs so that you have a pair to wear while the other is being washed.  Replace your stockings every 3-6 months.  If skin moisturizing is part of  your treatment plan, apply lotion or cream at night so that your skin will be dry when you put on the stockings in the morning. It is harder to put the stockings on when you have lotion on your legs or feet.  Wear nonskid shoes or slip-resistant socks when walking while wearing compression stockings. Contact a health care provider and remove your stockings if you have:  A feeling of pins and needles in your feet or legs.  Open sores, red spots, or  other skin changes on your feet or legs.  Swelling or pain that gets worse. Get help right away if you have:  Numbness or tingling in your lower legs that does not get better right after you take the stockings off.  Toes or feet that are unusually cold or turn a bluish color.  A warm or red area on your leg.  New swelling or soreness in your leg.  Shortness of breath.  Chest pain.  A fast or irregular heartbeat.  Light-headedness.  Dizziness. Summary  Compression stockings are elastic socks that squeeze the legs.  They help increase blood flow (circulation) to the legs, decrease swelling in the legs, and reduce the chance of developing blood clots in the lower legs.  Follow instructions from your health care provider about how and when to wear your compression stockings.  Do not stop wearing your compression stockings without talking to your health care provider first. This information is not intended to replace advice given to you by your health care provider. Make sure you discuss any questions you have with your health care provider. Document Revised: 11/23/2019 Document Reviewed: 11/23/2019 Elsevier Patient Education  2021 [employer].

## 2020-10-25 ENCOUNTER — Other Ambulatory Visit: Payer: Self-pay

## 2020-10-25 ENCOUNTER — Telehealth: Payer: Self-pay

## 2020-10-25 ENCOUNTER — Telehealth: Payer: Self-pay | Admitting: Physical Medicine and Rehabilitation

## 2020-10-25 ENCOUNTER — Encounter: Payer: Medicare Other | Attending: Registered Nurse | Admitting: Physical Medicine and Rehabilitation

## 2020-10-25 ENCOUNTER — Encounter: Payer: Self-pay | Admitting: Physical Medicine and Rehabilitation

## 2020-10-25 VITALS — BP 179/81 | HR 54 | Temp 97.7°F | Ht 67.0 in | Wt 188.0 lb

## 2020-10-25 DIAGNOSIS — M792 Neuralgia and neuritis, unspecified: Secondary | ICD-10-CM | POA: Diagnosis present

## 2020-10-25 DIAGNOSIS — I6522 Occlusion and stenosis of left carotid artery: Secondary | ICD-10-CM | POA: Diagnosis present

## 2020-10-25 DIAGNOSIS — I739 Peripheral vascular disease, unspecified: Secondary | ICD-10-CM | POA: Diagnosis present

## 2020-10-25 DIAGNOSIS — M1000 Idiopathic gout, unspecified site: Secondary | ICD-10-CM | POA: Diagnosis present

## 2020-10-25 DIAGNOSIS — F17210 Nicotine dependence, cigarettes, uncomplicated: Secondary | ICD-10-CM | POA: Diagnosis present

## 2020-10-25 DIAGNOSIS — R29898 Other symptoms and signs involving the musculoskeletal system: Secondary | ICD-10-CM

## 2020-10-25 DIAGNOSIS — E663 Overweight: Secondary | ICD-10-CM | POA: Diagnosis present

## 2020-10-25 DIAGNOSIS — I10 Essential (primary) hypertension: Secondary | ICD-10-CM | POA: Diagnosis present

## 2020-10-25 DIAGNOSIS — I6389 Other cerebral infarction: Secondary | ICD-10-CM | POA: Insufficient documentation

## 2020-10-25 DIAGNOSIS — R195 Other fecal abnormalities: Secondary | ICD-10-CM | POA: Diagnosis present

## 2020-10-25 MED ORDER — COLCHICINE 0.6 MG PO TABS: 0.6000 mg | ORAL_TABLET | Freq: Two times a day (BID) | ORAL | 1 refills | Status: DC | PRN

## 2020-10-25 MED ORDER — ALLOPURINOL 100 MG PO TABS: 100.0000 mg | ORAL_TABLET | Freq: Every day | ORAL | 0 refills | Status: DC

## 2020-10-25 MED ORDER — VARENICLINE TARTRATE 0.5 MG PO TABS: 0.5000 mg | ORAL_TABLET | Freq: Two times a day (BID) | ORAL | 0 refills | Status: DC

## 2020-10-25 MED ORDER — GABAPENTIN 300 MG PO CAPS: 300.0000 mg | ORAL_CAPSULE | Freq: Every day | ORAL | 2 refills | Status: DC

## 2020-10-25 MED ORDER — FUROSEMIDE 20 MG PO TABS: 20.0000 mg | ORAL_TABLET | Freq: Every day | ORAL | 3 refills | Status: DC

## 2020-10-25 MED ORDER — LISINOPRIL 20 MG PO TABS: 20.0000 mg | ORAL_TABLET | Freq: Every day | ORAL | 3 refills | Status: DC

## 2020-10-25 NOTE — Patient Instructions (Addendum)
HTN: 1) citrus foods 2) salmon and other fatty fish 3) swiss chard (leafy green) 4) pumpkin seeds 5) Beans and lentils 6) Berries 7) Amaranth (whole grain, can be cooked similarly to rice and oats) 8) Pistachios 9) Carrots 10) Celery 11) Tomatoes 12) Broccoli 13) Greek yogurt 14) Herbs and spices: Celery seed, cilantro, saffron, lemongrass, black cumin, ginseng, cinnamon, cardamom, sweet basil, and ginger 15) Chia and flax seeds 16) Beets 17) spinach -Educated that goal BP is 120/80. -Made goal to incorporate some of the above foods into her diet.    -Discussed foods that can assist in weight loss:  1) Eggs  2) Leafy greens  3) Salmon  4) Cruciferous vegetables  5) Lean beef and chicken breast  6) Boiled potatoes  7) Tuna  8) Beans and legumes  9) Soups  10) Cottage cheese  11) Avocados  12) Apple cider vinegar  13) Nuts  14) Whole grains  15) Chili pepper  16) Fruit- berries are some of the best  17) Grapefruit  18) Chia seeds  19) Coconut oil  20) Full-fat yogurt

## 2020-10-25 NOTE — Telephone Encounter (Signed)
Patient requesting 90 day on Colchicine and Allopurinol

## 2020-10-25 NOTE — Telephone Encounter (Signed)
[facility] faxed & called for incontinence supplies. Order has been signed & faxed today. Successful confirmation received.

## 2020-10-25 NOTE — Telephone Encounter (Signed)
Called patient daughter- advised of message from Dr.O'Neal after speaking with patients PCP- they are stopping mixed medications of lisinopril-hctz and start lisinopril 20 mg daily and lasix 20 mg daily.   Advised daughter of this, updated med list, and sent new medications over to pharmacy.   Daughter verbalized understanding.

## 2020-10-25 NOTE — Telephone Encounter (Signed)
This would be fine.  

## 2020-10-30 ENCOUNTER — Encounter: Payer: Medicare Other | Admitting: Gastroenterology

## 2020-10-30 ENCOUNTER — Ambulatory Visit (HOSPITAL_COMMUNITY)
Admission: RE | Admit: 2020-10-30 | Payer: Medicare Other | Source: Ambulatory Visit | Attending: Cardiovascular Disease | Admitting: Cardiovascular Disease

## 2020-10-31 ENCOUNTER — Telehealth: Payer: Self-pay | Admitting: *Deleted

## 2020-10-31 NOTE — Telephone Encounter (Signed)
[place] PTA [facility] called to report that Andres White/Andres White daughter wants the physical therapy service stopped.

## 2020-11-03 NOTE — Telephone Encounter (Signed)
That is fine if that is what they prefer!

## 2020-11-06 ENCOUNTER — Other Ambulatory Visit: Payer: Self-pay

## 2020-11-06 ENCOUNTER — Ambulatory Visit (HOSPITAL_COMMUNITY)
Admission: RE | Admit: 2020-11-06 | Discharge: 2020-11-06 | Disposition: A | Discharge status: Home / Self Care | Payer: Medicare Other | Source: Ambulatory Visit | Attending: Cardiovascular Disease | Admitting: Cardiovascular Disease

## 2020-11-06 DIAGNOSIS — R600 Localized edema: Secondary | ICD-10-CM | POA: Diagnosis present

## 2020-11-08 ENCOUNTER — Ambulatory Visit: Payer: Medicare Other | Admitting: Neurology

## 2020-11-08 VITALS — BP 160/92 | HR 86 | Ht 67.0 in | Wt 185.8 lb

## 2020-11-08 DIAGNOSIS — Z8673 Personal history of transient ischemic attack (TIA), and cerebral infarction without residual deficits: Secondary | ICD-10-CM | POA: Diagnosis not present

## 2020-11-08 DIAGNOSIS — F0150 Vascular dementia without behavioral disturbance: Secondary | ICD-10-CM

## 2020-11-08 DIAGNOSIS — G40011 Localization-related (focal) (partial) idiopathic epilepsy and epileptic syndromes with seizures of localized onset, intractable, with status epilepticus: Secondary | ICD-10-CM

## 2020-11-08 NOTE — Patient Instructions (Signed)
I had a long discussion with the patient and his daughter regarding recent hospitalization for partial seizure status and need for long-term anticonvulsants and answered questions.  His cognitive impairment and dementia also appeared to have worsened following this.  I recommend we continue current dosages of Keppra 500 mg twice daily and Dilantin 100 mg 3 times daily but if remains seizure-free at next follow-up visit may consider tapering Dilantin and stopping over the 3 months and continuing Keppra alone if.  Check repeat EEG for silent seizures.  Continue aspirin for stroke prevention with aggressive risk factor modification with strict control of hypertension with blood pressure goal below 130/90, lipids with LDL cholesterol goal below 70 mg percent.  He was advised to continue to use his walker at all times and fall and safety precautions.  Return for follow-up in the future in 3 months with my nurse practitioner Janett Billow or call earlier if necessary.  https://point-of-care.elsevierperformancemanager.com/skills">  Dementia Caregiver Guide Dementia is a term used to describe a number of symptoms that affect memory and thinking. The most common symptoms include:  Memory loss.  Trouble with language and communication.  Trouble concentrating.  Poor judgment and problems with reasoning.  Wandering from home or public places.  Extreme anxiety or depression.  Being suspicious or having angry outbursts and accusations.  Child-like behavior and language. Dementia can be frightening and confusing. And taking care of someone with dementia can be challenging. This guide provides tips to help you when providing care for a person with dementia. How to help manage lifestyle changes Dementia usually gets worse slowly over time. In the early stages, people with dementia can stay independent and safe with some help. In later stages, they need help with daily tasks such as dressing, grooming, and using the  bathroom. There are actions you can take to help a person manage his or her life while living with this condition. Communicating  When the person is talking or seems frustrated, make eye contact and hold the person's hand.  Ask specific questions that need yes or no answers.  Use simple words, short sentences, and a calm voice. Only give one direction at a time.  When offering choices, limit the person to just one or two.  Avoid correcting the person in a negative way.  If the person is struggling to find the right words, gently try to help him or her. Preventing injury  Keep floors clear of clutter. Remove rugs, magazine racks, and floor lamps.  Keep hallways well lit, especially at night.  Put a handrail and nonslip mat in the bathtub or shower.  Put childproof locks on cabinets that contain dangerous items, such as medicines, alcohol, guns, toxic cleaning items, sharp tools or utensils, matches, and lighters.  For doors to the outside of the house, put the locks in places where the person cannot see or reach them easily. This will help ensure that the person does not wander out of the house and get lost.  Be prepared for emergencies. Keep a list of emergency phone numbers and addresses in a convenient area.  Remove car keys and lock garage doors so that the person does not try to get in the car and drive.  Have the person wear a bracelet that tracks locations and identifies the person as having memory problems. This should be worn at all times for safety.   Helping with daily life  Keep the person on track with his or her routine.  Try to identify areas  where the person may need help.  Be supportive, patient, calm, and encouraging.  Gently remind the person that adjusting to changes takes time.  Help with the tasks that the person has asked for help with.  Keep the person involved in daily tasks and decisions as much as possible.  Encourage conversation, but try not to  get frustrated if the person struggles to find words or does not seem to appreciate your help.   How to recognize stress Look for signs of stress in yourself and in the person you are caring for. If you notice signs of stress, take steps to manage it. Symptoms of stress include:  Feeling anxious, irritable, frustrated, or angry.  Denying that the person has dementia or that his or her symptoms will not improve.  Feeling depressed, hopeless, or unappreciated.  Difficulty sleeping.  Difficulty concentrating.  Developing stress-related health problems.  Feeling like you have too little time for your own life. Follow these instructions at home: Take care of your health Make sure that you and the person you are caring for:  Get regular sleep.  Exercise regularly.  Eat regular, nutritious meals.  Take over-the-counter and prescription medicines only as told by your health care providers.  Drink enough fluid to keep your urine pale yellow.  Attend all scheduled health care appointments.   General instructions  Join a support group with others who are caregivers.  Ask about respite care resources. Respite care can provide short-term care for the person so that you can have a regular break from the stress of caregiving.  Consider any safety risks and take steps to avoid them.  Organize medicines in a pill box for each day of the week.  Create a plan to handle any legal or financial matters. Get legal or financial advice if needed.  Keep a calendar in a central location to remind the person of appointments or other activities. Where to find support: Many individuals and organizations offer support. These include:  Support groups for people with dementia.  Support groups for caregivers.  Counselors or therapists.  Home health care services.  Adult day care centers. Where to find more information  Centers for Disease Control and Prevention: http://www.wolf.info/  Alzheimer's  Association: [employer].co.nz  Family Caregiver Alliance: www.caregiver.[place]: www.alzfdn.org Contact a health care provider if:  The person's health is rapidly getting worse.  You are no longer able to care for the person.  Caring for the person is affecting your physical and emotional health.  You are feeling depressed or anxious about caring for the person. Get help right away if:  The person threatens himself or herself, you, or anyone else.  You feel depressed or sad, or feel that you want to harm yourself. If you ever feel like your loved one may hurt himself or herself or others, or if he or she shares thoughts about taking his or her own life, get help right away. You can go to your nearest emergency department or:  Call your local emergency services (911 in the [place]  Call a suicide crisis helpline, such as the [place] at (724)594-4452. This is open 24 hours a day in the [place]  Text the Crisis Text Line at (337) 548-8815 (in the [place].). Summary  Dementia is a term used to describe a number of symptoms that affect memory and thinking.  Dementia usually gets worse slowly over time.  Take steps to reduce the person's risk of injury and  to plan for future care.  Caregivers need support, relief from caregiving, and time for their own lives. This information is not intended to replace advice given to you by your health care provider. Make sure you discuss any questions you have with your health care provider. Document Revised: 12/05/2019 Document Reviewed: 12/05/2019 Elsevier Patient Education  [place].

## 2020-11-08 NOTE — Progress Notes (Signed)
[facility] [address] [place]. [place] [address] [phone]       OFFICE FOLLOW UP VISIT NOTE  [name] Date of Birth:  March 08, 1946 Medical Record Number:  [id number]   Referring MD: Reesa Chew, PA-C Reason for Referral: Stroke  HPI: Initial visit 08/30/2020: [name] is a 75 year old African-American male seen today for initial office consultation visit for stroke.  Is accompanied by his daughter who provides history.  I also reviewed electronic medical records and imaging films in PACS.  He has a past medical history of hypertension, tobacco abuse, arthritis who presented to [facility] on 06/25/2020 with right leg weakness for 4 days prior to admission.  He was found to have mild right hand and more leg weakness.  MRI scan of the brain showed a left midbrain lacunar infarct.  Carotid ultrasound suggested moderate 70 to 90% left ICA stenosis which is asymptomatic.  Echocardiogram showed normal ejection fraction without cardiac source of embolism.  LDL cholesterol was 32 mg percent.  Hemoglobin A1c was 4.8.  Lower extremity venous Dopplers were negative.  Patient subsequently had 2 weeks external cardiac monitor done which was negative for paroxysmal A. fib.  Patient was started on aspirin 81 mg daily which he is tolerating well without bruising or bleeding.  He went to inpatient rehab for 4 weeks and subsequently is at home living with her daughter.  Is able to ambulate independently now though he still has some diminished fine motor skills and mild right leg dragging.  His speech is improved.  He however has significant memory loss and cognitive impairment following his stroke as per the daughter.  He is quite forgetful and cannot remember recent conversations and needs constant reminders.  He was previously living independently and managing his own affairs but daughter feels he is no longer able to do so.  Patient states he is cut back  smoking significantly but still smokes a few cigarettes a day.  His blood pressure is well controlled and today it is 142/86.  There is no family history of dementia.  Patient has no prior history of seizures or significant neurological problems. Update 11/08/2020: He returns for follow-up after last visit 3 months ago.  Patient is referred back to see me because of new problems of seizures which required admission to [facility] on 09/16/2020.  Patient will at home when family witnessed that he was not acting his usual self and was gazing upwards.  When EMS arrived they noticed a left gaze deviation and unresponsiveness to voice and following commands and possibly some left-sided weakness.  Patient was initially brought in as a code stroke but while being evaluated was noticed is likely having a seizure because he had tonic gaze deviation with a cry and stiffening of the whole body followed by generalized shaking which lasted about a minute.  He was given IV Ativan which stopped the episode.  He was also loaded with IV Keppra.  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  EEG done on 09/17/2020 and 09/18/2020 showed moderate diffuse encephalopathy there were several episodes of even better being pushed by the staff when they noticed tremulous movements on the right but these did not have any electrographic correlates for seizures.  Patient was intubated and IV phenytoin was added to the Keppra with the got  better and self extubated himself.  He finished long-term EEG monitoring which showed no definite seizure activity.  MRI scan of the brain on 09/20/2020 was motion degraded but showed no acute abnormality.  Showed evidence of old infarcts bilaterally.  Patient did have some cognitive impairment following his previous stroke when the daughter feels that this has gotten  worse now.  He requires constant supervision.  He cannot be left alone.  He is able to walk with a walker but he is disoriented at baseline and requires help with most activities of daily living.  He has had no recurrent seizures of focal jerking since his being home.  He is still on the current dosages of Keppra finder milligram twice daily and phenytoin 100 mg   3 times a day.  Is had no recurrent stroke or TIA symptoms and remains on aspirin she is tolerating well.  Blood pressure is well controlled today it is slightly elevated 160/82.  Is tolerating Lipitor well without muscle aches and pains.  ROS:   14 system review of systems is positive for seizures, altered consciousness, decreased responsiveness, focal jerking weakness, memory loss, and attention difficulties, imbalance, dysarthria, all other systems negative.   PMH:  Past Medical History:  Diagnosis Date  . Arthritis   . Chronic back pain   . Duodenal adenoma   . Gout   . Hypertension   . Stroke ([place]) 06/2020  . Syncope and collapse     Social History:  Social History   Socioeconomic History  . Marital status: Single    Spouse name: Not on file  . Number of children: Not on file  . Years of education: Not on file  . Highest education level: Not on file  Occupational History  . Not on file  Tobacco Use  . Smoking status: Current Every Day Smoker    Years: 20.00    Types: Cigarettes  . Smokeless tobacco: Never Used  Vaping Use  . Vaping Use: Never used  Substance and Sexual Activity  . Alcohol use: Not Currently    Comment: occasional  . Drug use: No  . Sexual activity: Not on file  Other Topics Concern  . Not on file  Social History Narrative   Lives with daughter, son in law and 2 grandchildren   Right Handed   Drinks 1 cup caffeine 4 times a week-ish   Social Determinants of Health   Financial Resource Strain: Not on file  Food Insecurity: Not on file  Transportation Needs: Not on file  Physical  Activity: Not on file  Stress: Not on file  Social Connections: Not on file  Intimate Partner Violence: Not on file    Medications:   Current Outpatient Medications on File Prior to Visit  Medication Sig Dispense Refill  . acetaminophen (TYLENOL) 325 MG tablet Take 1-2 tablets (325-650 mg total) by mouth every 4 (four) hours as needed for mild pain.    [name] allopurinol (ZYLOPRIM) 100 MG tablet TAKE 1 TABLET(100 MG) BY MOUTH DAILY 90 tablet 0  . aspirin EC 81 MG tablet Take 1 tablet (81 mg total) by mouth daily. 90 tablet 3  . atorvastatin (LIPITOR) 40 MG tablet Take 40 mg by mouth daily.    . citalopram (CELEXA) 20 MG tablet Take 1 tablet (20 mg total) by mouth daily. 30 tablet 0  . colchicine 0.6 MG tablet TAKE 1 TABLET(0.6 MG) BY MOUTH TWICE DAILY AS NEEDED FOR GOUT FLARE 180 tablet 0  . diclofenac Sodium (  VOLTAREN) 1 % GEL Apply 2 g topically 4 (four) times daily. 100 g 1  . Ferrous Sulfate (IRON) 325 (65 Fe) MG TABS Take 1 tablet (325 mg total) by mouth 2 (two) times daily before a meal. 578 tablet 0  . folic acid (FOLVITE) 1 MG tablet Take 2 tablets (2 mg total) by mouth daily. 180 tablet 0  . furosemide (LASIX) 20 MG tablet Take 1 tablet (20 mg total) by mouth daily. 90 tablet 3  . gabapentin (NEURONTIN) 100 MG capsule Take 1 capsule (100 mg total) by mouth 2 (two) times daily. 60 capsule 0  . gabapentin (NEURONTIN) 300 MG capsule Take 1 capsule (300 mg total) by mouth at bedtime. 30 capsule 2  . hydrocerin (EUCERIN) CREA Apply 1 application topically 2 (two) times daily.  0  . levETIRAcetam (KEPPRA) 500 MG tablet Take 1 tablet (500 mg total) by mouth 2 (two) times daily. 180 tablet 1  . lisinopril (ZESTRIL) 20 MG tablet Take 1 tablet (20 mg total) by mouth daily. 90 tablet 3  . Multiple Vitamin (MULTIVITAMIN WITH MINERALS) TABS tablet Take 1 tablet by mouth daily.    . Na Sulfate-K Sulfate-Mg Sulf (SUPREP BOWEL PREP KIT) 17.5-3.13-1.6 GM/177ML SOLN Take 1 kit by mouth as directed. For  colonoscopy prep 354 mL 0  . pantoprazole (PROTONIX) 40 MG tablet TAKE 1 TABLET(40 MG) BY MOUTH TWICE DAILY 60 tablet 1  . phenytoin (DILANTIN) 100 MG ER capsule Take 1 capsule (100 mg total) by mouth 3 (three) times daily. 270 capsule 1  . QUEtiapine (SEROQUEL) 50 MG tablet Take 1 tablet (50 mg total) by mouth at bedtime as needed. 90 tablet 1  . tamsulosin (FLOMAX) 0.4 MG CAPS capsule Take 1 capsule (0.4 mg total) by mouth daily. 30 capsule 0   No current facility-administered medications on file prior to visit.    Allergies:  No Known Allergies  Physical Exam General: Frail elderly African-American male, seated, in no evident distress Head: head normocephalic and atraumatic.   Neck: supple with no carotid or supraclavicular bruits Cardiovascular: regular rate and rhythm, no murmurs Musculoskeletal: no deformity Skin:  no rash/petichiae Vascular:  Normal pulses all extremities  Neurologic Exam Mental Status: Awake and fully alert. Not oriented to place and time. Recent and remote memory  poor. attention span, concentration and fund of knowledge diminished.  Mini-Mental status exam not done but last visit scored 14/30 with deficits in orientation, attention, registration, recall, comprehension, reading, writing and following commands.  Difficulty in copying pentagons and clock drawing 0/4. Mood and affect appropriate.  Cranial Nerves: Fundoscopic exam not done. Pupils equal, briskly reactive to light. Extraocular movements full without nystagmus. Visual fields full to confrontation. Hearing intact. Facial sensation intact. Face, tongue, palate moves normally and symmetrically.  Motor: Normal bulk and tone. Normal strength in all tested extremity muscles except mild right grip weakness and diminished fine finger movements on the right and orbits left to right upper extremity.  Mild weakness of right hip flexor and ankle dorsiflexors.. Sensory.: intact to touch , pinprick , position and  vibratory sensation.  Coordination: Rapid alternating movements normal in all extremities. Finger-to-nose and heel-to-shin performed accurately bilaterally. Gait and Station: Arises from chair with  difficulty. Stance is stooped.  Using a walker.  Gait demonstrates slight dragging of the right leg.  Not able to heel, toe and tandem walk without difficulty.  Reflexes: 1+ and symmetric. Toes downgoing.   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  He has post stroke residual mild right hemiparesis as well as dementia and memory loss following his stroke which appears to have worsened after recent admission for seizures in February 2022.[name]     PLAN: I had a long discussion with the patient and his daughter regarding recent hospitalization for partial seizure status and need for long-term anticonvulsants and answered questions.  His cognitive impairment and dementia also appeared to have worsened following this.  I recommend we continue current dosages of Keppra 500 mg twice daily and Dilantin 100 mg 3 times daily but if remains seizure-free at next follow-up visit may consider tapering Dilantin and stopping over the 3 months and continuing Keppra alone if.  Check repeat EEG for silent seizures.  Continue aspirin for stroke prevention with aggressive risk factor modification with strict control of hypertension with blood pressure goal below 130/90, lipids with LDL cholesterol goal below 70 mg percent.  He was advised to continue to use his walker at all times and fall and safety precautions.  Return for follow-up in the future in 3 months with my nurse practitioner Andres White or call earlier if necessary. Greater than 50% time during this prolonged 50-minute visit was it was spent on counseling and coordination of care about his lacunar  stroke, hemiparesis, memory loss and cognitive impairment and answering questions Antony Contras, MD Note: This document was prepared with digital dictation and possible smart phrase technology. Any transcriptional errors that result from this process are unintentional.

## 2020-11-12 ENCOUNTER — Other Ambulatory Visit: Payer: Self-pay

## 2020-11-12 DIAGNOSIS — I739 Peripheral vascular disease, unspecified: Secondary | ICD-10-CM

## 2020-11-22 NOTE — Progress Notes (Signed)
Attempted to obtain medical history via telephone, unable to reach at this time. I left a voicemail to return pre surgical testing department's phone call.  

## 2020-11-24 ENCOUNTER — Other Ambulatory Visit (HOSPITAL_COMMUNITY): Payer: Medicare Other

## 2020-11-26 ENCOUNTER — Other Ambulatory Visit (HOSPITAL_COMMUNITY)
Admission: RE | Admit: 2020-11-26 | Discharge: 2020-11-26 | Disposition: A | Discharge status: Home / Self Care | Payer: Medicare (Managed Care) | Source: Ambulatory Visit | Attending: Gastroenterology | Admitting: Gastroenterology

## 2020-11-26 DIAGNOSIS — Z01812 Encounter for preprocedural laboratory examination: Secondary | ICD-10-CM | POA: Insufficient documentation

## 2020-11-26 DIAGNOSIS — Z20822 Contact with and (suspected) exposure to covid-19: Secondary | ICD-10-CM | POA: Diagnosis not present

## 2020-11-27 LAB — SARS CORONAVIRUS 2 (TAT 6-24 HRS): SARS Coronavirus 2: NEGATIVE

## 2020-11-27 NOTE — Anesthesia Preprocedure Evaluation (Addendum)
Anesthesia Evaluation  Patient identified by MRN, date of birth, ID band Patient awake    Reviewed: Allergy & Precautions, NPO status , Patient's Chart, lab work & pertinent test results  Airway Mallampati: II  TM Distance: >3 FB Neck ROM: Full    Dental  (+) Edentulous Upper, Edentulous Lower   Pulmonary neg pulmonary ROS, Current Smoker and Patient abstained from smoking.,    Pulmonary exam normal        Cardiovascular hypertension, Pt. on medications  Rhythm:Regular Rate:Normal     Neuro/Psych Seizures -, Well Controlled,  CVA (11/21) negative psych ROS   GI/Hepatic Neg liver ROS, GERD  Medicated,H/x polyps   Endo/Other    Renal/GU negative Renal ROS  negative genitourinary   Musculoskeletal  (+) Arthritis ,   Abdominal (+)  Abdomen: soft. Bowel sounds: normal.  Peds  Hematology  (+) anemia ,   Anesthesia Other Findings   Reproductive/Obstetrics                           Anesthesia Physical Anesthesia Plan  ASA: III  Anesthesia Plan: MAC   Post-op Pain Management:    Induction: Intravenous  PONV Risk Score and Plan: 0 and Propofol infusion  Airway Management Planned: Simple Face Mask, Natural Airway and Nasal Cannula  Additional Equipment: None  Intra-op Plan:   Post-operative Plan:   Informed Consent: I have reviewed the patients History and Physical, chart, labs and discussed the procedure including the risks, benefits and alternatives for the proposed anesthesia with the patient or authorized representative who has indicated his/her understanding and acceptance.     Dental advisory given  Plan Discussed with: CRNA  Anesthesia Plan Comments: (Lab Results      Component                Value               Date                      WBC                      8.5                 10/19/2020                HGB                      10.6 (L)            10/19/2020                 HCT                      32.4 (L)            10/19/2020                MCV                      88.2                10/19/2020                PLT                      237.0  10/19/2020           Lab Results      Component                Value               Date                      NA                       140                 10/19/2020                K                        3.7                 10/19/2020                CO2                      23                  10/19/2020                GLUCOSE                  76                  10/19/2020                BUN                      15                  10/19/2020                CREATININE               1.18                10/19/2020                CALCIUM                  8.9                 10/19/2020                GFRNONAA                 >60                 09/22/2020                GFRAA                    32 (L)              11/18/2017          )       Anesthesia Quick Evaluation

## 2020-11-28 ENCOUNTER — Encounter (HOSPITAL_COMMUNITY): Payer: Self-pay | Admitting: Gastroenterology

## 2020-11-28 ENCOUNTER — Ambulatory Visit (HOSPITAL_COMMUNITY): Payer: Medicare (Managed Care) | Admitting: Anesthesiology

## 2020-11-28 ENCOUNTER — Encounter (HOSPITAL_COMMUNITY)
Admission: RE | Disposition: A | Discharge status: Home / Self Care | Payer: Self-pay | Source: Home / Self Care | Attending: Gastroenterology

## 2020-11-28 ENCOUNTER — Other Ambulatory Visit: Payer: Self-pay

## 2020-11-28 ENCOUNTER — Ambulatory Visit (HOSPITAL_COMMUNITY)
Admission: RE | Admit: 2020-11-28 | Discharge: 2020-11-28 | Disposition: A | Discharge status: Home / Self Care | Payer: Medicare (Managed Care) | Attending: Gastroenterology | Admitting: Gastroenterology

## 2020-11-28 DIAGNOSIS — K2970 Gastritis, unspecified, without bleeding: Secondary | ICD-10-CM | POA: Insufficient documentation

## 2020-11-28 DIAGNOSIS — K641 Second degree hemorrhoids: Secondary | ICD-10-CM | POA: Insufficient documentation

## 2020-11-28 DIAGNOSIS — D123 Benign neoplasm of transverse colon: Secondary | ICD-10-CM | POA: Insufficient documentation

## 2020-11-28 DIAGNOSIS — K2980 Duodenitis without bleeding: Secondary | ICD-10-CM | POA: Diagnosis not present

## 2020-11-28 DIAGNOSIS — K317 Polyp of stomach and duodenum: Secondary | ICD-10-CM | POA: Diagnosis not present

## 2020-11-28 DIAGNOSIS — D124 Benign neoplasm of descending colon: Secondary | ICD-10-CM | POA: Diagnosis not present

## 2020-11-28 DIAGNOSIS — I10 Essential (primary) hypertension: Secondary | ICD-10-CM | POA: Diagnosis not present

## 2020-11-28 DIAGNOSIS — D649 Anemia, unspecified: Secondary | ICD-10-CM

## 2020-11-28 DIAGNOSIS — K644 Residual hemorrhoidal skin tags: Secondary | ICD-10-CM | POA: Insufficient documentation

## 2020-11-28 DIAGNOSIS — Z1211 Encounter for screening for malignant neoplasm of colon: Secondary | ICD-10-CM | POA: Diagnosis present

## 2020-11-28 DIAGNOSIS — Z86010 Personal history of colonic polyps: Secondary | ICD-10-CM

## 2020-11-28 DIAGNOSIS — Z8249 Family history of ischemic heart disease and other diseases of the circulatory system: Secondary | ICD-10-CM | POA: Insufficient documentation

## 2020-11-28 DIAGNOSIS — Z8673 Personal history of transient ischemic attack (TIA), and cerebral infarction without residual deficits: Secondary | ICD-10-CM | POA: Diagnosis not present

## 2020-11-28 DIAGNOSIS — K5900 Constipation, unspecified: Secondary | ICD-10-CM

## 2020-11-28 DIAGNOSIS — F17210 Nicotine dependence, cigarettes, uncomplicated: Secondary | ICD-10-CM | POA: Diagnosis not present

## 2020-11-28 HISTORY — PX: BIOPSY: SHX5522

## 2020-11-28 HISTORY — PX: ESOPHAGOGASTRODUODENOSCOPY (EGD) WITH PROPOFOL: SHX5813

## 2020-11-28 HISTORY — PX: SUBMUCOSAL LIFTING INJECTION: SHX6855

## 2020-11-28 HISTORY — PX: POLYPECTOMY: SHX5525

## 2020-11-28 HISTORY — PX: COLONOSCOPY WITH PROPOFOL: SHX5780

## 2020-11-28 HISTORY — PX: SUBMUCOSAL TATTOO INJECTION: SHX6856

## 2020-11-28 HISTORY — PX: ENDOSCOPIC MUCOSAL RESECTION: SHX6839

## 2020-11-28 HISTORY — PX: HEMOSTASIS CLIP PLACEMENT: SHX6857

## 2020-11-28 SURGERY — COLONOSCOPY WITH PROPOFOL
Anesthesia: Monitor Anesthesia Care

## 2020-11-28 MED ORDER — LIDOCAINE 2% (20 MG/ML) 5 ML SYRINGE
INTRAMUSCULAR | Status: DC | PRN
  Administered 2020-11-28: 80 mg via INTRAVENOUS

## 2020-11-28 MED ORDER — PROPOFOL 1000 MG/100ML IV EMUL
INTRAVENOUS | Status: AC
  Filled 2020-11-28: qty 100

## 2020-11-28 MED ORDER — SUCRALFATE 1 GM/10ML PO SUSP: 1.0000 g | Freq: Three times a day (TID) | ORAL | 1 refills | Status: DC

## 2020-11-28 MED ORDER — PROPOFOL 10 MG/ML IV BOLUS
INTRAVENOUS | Status: DC | PRN
  Administered 2020-11-28 (×2): 30 mg via INTRAVENOUS

## 2020-11-28 MED ORDER — PHENYLEPHRINE 40 MCG/ML (10ML) SYRINGE FOR IV PUSH (FOR BLOOD PRESSURE SUPPORT)
PREFILLED_SYRINGE | INTRAVENOUS | Status: DC | PRN
  Administered 2020-11-28: 120 ug via INTRAVENOUS
  Administered 2020-11-28 (×3): 80 ug via INTRAVENOUS
  Administered 2020-11-28: 120 ug via INTRAVENOUS
  Administered 2020-11-28 (×5): 80 ug via INTRAVENOUS

## 2020-11-28 MED ORDER — LEVETIRACETAM 500 MG PO TABS: 500.0000 mg | ORAL_TABLET | Freq: Once | ORAL | Status: DC

## 2020-11-28 MED ORDER — LACTATED RINGERS IV SOLN
INTRAVENOUS | Status: AC | PRN
  Administered 2020-11-28: 1000 mL via INTRAVENOUS

## 2020-11-28 MED ORDER — SODIUM CHLORIDE 0.9 % IV SOLN: INTRAVENOUS | Status: DC

## 2020-11-28 MED ORDER — PROPOFOL 500 MG/50ML IV EMUL
INTRAVENOUS | Status: DC | PRN
  Administered 2020-11-28: 125 ug/kg/min via INTRAVENOUS

## 2020-11-28 MED ORDER — SPOT INK MARKER SYRINGE KIT
PACK | SUBMUCOSAL | Status: DC | PRN
  Administered 2020-11-28: 1.5 mL via SUBMUCOSAL

## 2020-11-28 MED ORDER — LACTATED RINGERS IV SOLN: INTRAVENOUS | Status: DC

## 2020-11-28 MED ORDER — PROPOFOL 500 MG/50ML IV EMUL
INTRAVENOUS | Status: AC
  Filled 2020-11-28: qty 50

## 2020-11-28 MED ORDER — EPHEDRINE SULFATE-NACL 50-0.9 MG/10ML-% IV SOSY
PREFILLED_SYRINGE | INTRAVENOUS | Status: DC | PRN
  Administered 2020-11-28 (×3): 10 mg via INTRAVENOUS

## 2020-11-28 MED ORDER — PHENYTOIN 50 MG PO CHEW: 100.0000 mg | CHEWABLE_TABLET | Freq: Once | ORAL | Status: DC

## 2020-11-28 MED ORDER — SPOT INK MARKER SYRINGE KIT
PACK | SUBMUCOSAL | Status: AC
  Filled 2020-11-28: qty 5

## 2020-11-28 SURGICAL SUPPLY — 24 items

## 2020-11-28 NOTE — Anesthesia Procedure Notes (Signed)
Date/Time: 11/28/2020 7:46 AM Performed by: Talbot Grumbling, CRNA Oxygen Delivery Method: Simple face mask

## 2020-11-28 NOTE — Transfer of Care (Signed)
Immediate Anesthesia Transfer of Care Note  Patient: Andres White  Procedure(s) Performed: COLONOSCOPY WITH PROPOFOL (N/A ) ESOPHAGOGASTRODUODENOSCOPY (EGD) WITH PROPOFOL (N/A ) ENDOSCOPIC MUCOSAL RESECTION (N/A ) BIOPSY SUBMUCOSAL LIFTING INJECTION POLYPECTOMY HEMOSTASIS CLIP PLACEMENT SUBMUCOSAL TATTOO INJECTION  Patient Location: PACU  Anesthesia Type:MAC  Level of Consciousness: sedated  Airway & Oxygen Therapy: Patient Spontanous Breathing and Patient connected to face mask oxygen  Post-op Assessment: Report given to RN and Post -op Vital signs reviewed and stable  Post vital signs: Reviewed and stable  Last Vitals:  Vitals Value Taken Time  BP    Temp    Pulse    Resp    SpO2      Last Pain:  Vitals:   11/28/20 0634  TempSrc: Oral  PainSc: 0-No pain         Complications: No complications documented.

## 2020-11-28 NOTE — Discharge Instructions (Signed)
YOU HAD AN ENDOSCOPIC PROCEDURE TODAY: Refer to the procedure report and other information in the discharge instructions given to you for any specific questions about what was found during the examination. If this information does not answer your questions, please call [place] office at [phone] to clarify.  ° °YOU SHOULD EXPECT: Some feelings of bloating in the abdomen. Passage of more gas than usual. Walking can help get rid of the air that was put into your GI tract during the procedure and reduce the bloating. If you had a lower endoscopy (such as a colonoscopy or flexible sigmoidoscopy) you may notice spotting of blood in your stool or on the toilet paper. Some abdominal soreness may be present for a day or two, also. ° °DIET: Your first meal following the procedure should be a light meal and then it is ok to progress to your normal diet. A half-sandwich or bowl of soup is an example of a good first meal. Heavy or fried foods are harder to digest and may make you feel nauseous or bloated. Drink plenty of fluids but you should avoid alcoholic beverages for 24 hours.  ° °ACTIVITY: Your care partner should take you home directly after the procedure. You should plan to take it easy, moving slowly for the rest of the day. You can resume normal activity the day after the procedure however YOU SHOULD NOT DRIVE, use power tools, machinery or perform tasks that involve climbing or major physical exertion for 24 hours (because of the sedation medicines used during the test).  ° °SYMPTOMS TO REPORT IMMEDIATELY: °A gastroenterologist can be reached at any hour. Please call [phone]  for any of the following symptoms:  °Following lower endoscopy (colonoscopy, flexible sigmoidoscopy) °Excessive amounts of blood in the stool  °Significant tenderness, worsening of abdominal pains  °Swelling of the abdomen that is new, acute  °Fever of 100° or higher  °Following upper endoscopy (EGD, EUS, ERCP, esophageal  dilation) °Vomiting of blood or coffee ground material  °New, significant abdominal pain  °New, significant chest pain or pain under the shoulder blades  °Painful or persistently difficult swallowing  °New shortness of breath  °Black, tarry-looking or red, bloody stools ° °FOLLOW UP:  °If any biopsies were taken you will be contacted by phone or by letter within the next 1-3 weeks. Call [phone]  if you have not heard about the biopsies in 3 weeks.  °Please also call with any specific questions about appointments or follow up tests.  °

## 2020-11-28 NOTE — H&P (Signed)
GASTROENTEROLOGY PROCEDURE H&P NOTE   Primary Care Physician: [name], MD  HPI: Andres White is a 75 y.o. male who presents for EGD/Colonoscopy for duodenal adenoma and colon polyp surveillance with prior poor preparation.  Past Medical History:  Diagnosis Date  . Arthritis   . Chronic back pain   . Duodenal adenoma   . Gout   . Hypertension   . Stroke ([place]) 06/2020  . Syncope and collapse    Past Surgical History:  Procedure Laterality Date  . BIOPSY  07/13/2020   Procedure: BIOPSY;  Surgeon: Ladene Artist, MD;  Location: [facility] ENDOSCOPY;  Service: Endoscopy;;  . CARDIAC CATHETERIZATION  2015   [facility]   . COLONOSCOPY WITH PROPOFOL N/A 07/13/2020   Procedure: COLONOSCOPY WITH PROPOFOL;  Surgeon: Ladene Artist, MD;  Location: [facility] ENDOSCOPY;  Service: Endoscopy;  Laterality: N/A;  . ESOPHAGOGASTRODUODENOSCOPY N/A 07/13/2020   Procedure: ESOPHAGOGASTRODUODENOSCOPY (EGD);  Surgeon: Ladene Artist, MD;  Location: [facility] ENDOSCOPY;  Service: Endoscopy;  Laterality: N/A;  . KNEE SURGERY Right   . POLYPECTOMY  07/13/2020   Procedure: POLYPECTOMY;  Surgeon: Ladene Artist, MD;  Location[facility] ENDOSCOPY;  Service: Endoscopy;;   Current Facility-Administered Medications  Medication Dose Route Frequency Provider Last Rate Last Admin  . 0.9 %  sodium chloride infusion   Intravenous Continuous Mansouraty, Telford Nab., MD      . lactated ringers infusion   Intravenous Continuous Mansouraty, Telford Nab., MD   New Bag at 11/28/20 [phone]  . lactated ringers infusion    Continuous PRN Mansouraty, Telford Nab., MD 10 mL/hr at 11/28/20 0706 1,000 mL at 11/28/20 0706   No Known Allergies Family History  Problem Relation Age of Onset  . Hypertension Mother   . Colon cancer Neg Hx   . Pancreatic cancer Neg Hx   . Stomach cancer Neg Hx   . Esophageal cancer Neg Hx   . Inflammatory bowel disease Neg Hx   . Liver disease Neg Hx   . Rectal cancer Neg Hx    Social History    Socioeconomic History  . Marital status: Single    Spouse name: Not on file  . Number of children: Not on file  . Years of education: Not on file  . Highest education level: Not on file  Occupational History  . Not on file  Tobacco Use  . Smoking status: Current Every Day Smoker    Years: 20.00    Types: Cigarettes  . Smokeless tobacco: Never Used  Vaping Use  . Vaping Use: Never used  Substance and Sexual Activity  . Alcohol use: Not Currently    Comment: occasional  . Drug use: No  . Sexual activity: Not on file  Other Topics Concern  . Not on file  Social History Narrative   Lives with daughter, son in law and 2 grandchildren   Right Handed   Drinks 1 cup caffeine 4 times a week-ish   Social Determinants of Health   Financial Resource Strain: Not on file  Food Insecurity: Not on file  Transportation Needs: Not on file  Physical Activity: Not on file  Stress: Not on file  Social Connections: Not on file  Intimate Partner Violence: Not on file    Physical Exam: Vital signs in last 24 hours: Temp:  [97.9 F (36.6 C)] 97.9 F (36.6 C) (04/27 0634) Pulse Rate:  [62] 62 (04/27 0634) Resp:  [22] 22 (04/27 0634) BP: (132)/(81) 132/81 (04/27 0634) SpO2:  [97 %]  97 % (04/27 0634) Weight:  [81.6 kg] 81.6 kg (04/27 0634)   GEN: NAD EYE: Sclerae anicteric ENT: MMM CV: Non-tachycardic GI: Soft, NT/ND NEURO:  Alert & Oriented x 3  Lab Results: No results for input(s): WBC, HGB, HCT, PLT in the last 72 hours. BMET No results for input(s): NA, K, CL, CO2, GLUCOSE, BUN, CREATININE, CALCIUM in the last 72 hours. LFT No results for input(s): PROT, ALBUMIN, AST, ALT, ALKPHOS, BILITOT, BILIDIR, IBILI in the last 72 hours. PT/INR No results for input(s): LABPROT, INR in the last 72 hours.   Impression / Plan: This is a 75 y.o.male who presents for EGD/Colonoscopy for duodenal adenoma and colon polyp surveillance with prior poor preparation.  The risks and benefits  of endoscopic evaluation were discussed with the patient; these include but are not limited to the risk of perforation, infection, bleeding, missed lesions, lack of diagnosis, severe illness requiring hospitalization, as well as anesthesia and sedation related illnesses.  The patient is agreeable to proceed.    Justice Britain, MD [facility] Advanced [facility]oscopy Office # [phone]

## 2020-11-28 NOTE — Op Note (Signed)
[facility] Patient Name: Andres White Procedure Date: 11/28/2020 MRN: [id number] Attending MD: Justice Britain , MD Date of Birth: [birth year] CSN: [id number] Age: 75 Admit Type: Outpatient Procedure:                Colonoscopy Indications:              Screening for colorectal malignant neoplasm,                            Previous polyps noted not removed at time of                            Inpatient hospital colonoscopy due to inadequate                            preparation Providers:                Justice Britain, MD, Kary Kos RN, RN, Ladona Ridgel, Technician, Marla Roe, CRNA Referring MD:             [name], MD, Pricilla Riffle. Fuller Plan, MD Medicines:                Monitored Anesthesia Care Complications:            No immediate complications. Estimated Blood Loss:     Estimated blood loss was minimal. Procedure:                Pre-Anesthesia Assessment:                           - Prior to the procedure, a History and Physical                            was performed, and patient medications and                            allergies were reviewed. The patient's tolerance of                            previous anesthesia was also reviewed. The risks                            and benefits of the procedure and the sedation                            options and risks were discussed with the patient.                            All questions were answered, and informed consent                            was obtained. Prior Anticoagulants: The patient has  taken no previous anticoagulant or antiplatelet                            agents except for aspirin. ASA Grade Assessment:                            III - A patient with severe systemic disease. After                            reviewing the risks and benefits, the patient was                            deemed in satisfactory  condition to undergo the                            procedure.                           After obtaining informed consent, the colonoscope                            was passed under direct vision. Throughout the                            procedure, the patient's blood pressure, pulse, and                            oxygen saturations were monitored continuously. The                            PCF-H190DL ([id number]) Olympus pediatric colonscope                            was introduced through the anus and advanced to the                            5 cm into the ileum. The colonoscopy was performed                            without difficulty. The patient tolerated the                            procedure. The quality of the bowel preparation was                            adequate. The terminal ileum, ileocecal valve,                            appendiceal orifice, and rectum were photographed. Scope In: 8:45:52 AM Scope Out: 9:11:19 AM Scope Withdrawal Time: 0 hours 17 minutes 39 seconds  Total Procedure Duration: 0 hours 25 minutes 27 seconds  Findings:      The digital rectal exam findings include hemorrhoids. Pertinent       negatives include no palpable rectal lesions.  A moderate amount of liquid semi-liquid stool was found in the entire       colon, interfering with visualization. Lavage of the area was performed       using copious amounts, resulting in clearance with adequate       visualization.      The terminal ileum and ileocecal valve appeared normal.      Eight sessile and semi-sessile polyps were found in the descending colon       (1), splenic flexure (2) and transverse colon (5). The polyps were 3 to       10 mm in size. These polyps were removed with a cold snare. Resection       and retrieval were complete.      Normal mucosa was found in the entire colon otherwise.      Non-bleeding non-thrombosed external and internal hemorrhoids were found       during  retroflexion, during perianal exam and during digital exam. The       hemorrhoids were Grade II (internal hemorrhoids that prolapse but reduce       spontaneously). Impression:               - Hemorrhoids found on digital rectal exam.                           - Stool in the entire examined colon.                           - The examined portion of the ileum was normal.                           - Eight 3 to 10 mm polyps in the descending colon,                            at the splenic flexure and in the transverse colon,                            removed with a cold snare. Resected and retrieved.                           - Normal mucosa in the entire examined colon                            otherwise.                           - Non-bleeding non-thrombosed external and internal                            hemorrhoids. Moderate Sedation:      Not Applicable - Patient had care per Anesthesia. Recommendation:           - The patient will be observed post-procedure,                            until all discharge criteria are met.                           -  Discharge patient to home.                           - Patient has a contact number available for                            emergencies. The signs and symptoms of potential                            delayed complications were discussed with the                            patient. Return to normal activities tomorrow.                            Written discharge instructions were provided to the                            patient.                           - High fiber diet.                           - Continue present medications.                           - Await pathology results.                           - Repeat colonoscopy in 3 years for surveillance                            with similar preparation or extended preparation.                           - The findings and recommendations were discussed                             with the patient.                           - The findings and recommendations were discussed                            with the patient's family. Procedure Code(s):        --- Professional ---                           [phone], Colonoscopy, flexible; with removal of                            tumor(s), polyp(s), or other lesion(s) by snare                            technique Diagnosis Code(s):        ---  Professional ---                           Z12.11, Encounter for screening for malignant                            neoplasm of colon                           K64.1, Second degree hemorrhoids                           K63.5, Polyp of colon CPT copyright 2019 American [employer]. All rights reserved. The codes documented in this report are preliminary and upon coder review may  be revised to meet current compliance requirements. Justice Britain, MD 11/28/2020 9:39:18 AM Number of Addenda: 0

## 2020-11-28 NOTE — Op Note (Signed)
[facility] Patient Name: Andres White Procedure Date: 11/28/2020 MRN: [id number] Attending MD: Justice Britain , MD Date of Birth: Nov 28, 1945 CSN: [id number] Age: 75 Admit Type: Outpatient Procedure:                Upper GI endoscopy Indications:              Heartburn Providers:                Justice Britain, MD, Kary Kos RN, RN, Ladona Ridgel, Technician, Marla Roe, CRNA Referring MD:             Pricilla Riffle. Fuller Plan, MD, [name], MD Medicines:                Monitored Anesthesia Care Complications:            No immediate complications. Estimated Blood Loss:     Estimated blood loss was minimal. Procedure:                Pre-Anesthesia Assessment:                           - Prior to the procedure, a History and Physical                            was performed, and patient medications and                            allergies were reviewed. The patient's tolerance of                            previous anesthesia was also reviewed. The risks                            and benefits of the procedure and the sedation                            options and risks were discussed with the patient.                            All questions were answered, and informed consent                            was obtained. Prior Anticoagulants: The patient has                            taken no previous anticoagulant or antiplatelet                            agents except for aspirin. ASA Grade Assessment:                            III - A patient with severe systemic disease. After  reviewing the risks and benefits, the patient was                            deemed in satisfactory condition to undergo the                            procedure.                           After obtaining informed consent, the endoscope was                            passed under direct vision. Throughout the                             procedure, the patient's blood pressure, pulse, and                            oxygen saturations were monitored continuously. The                            GIF-H190 ([id number]) Olympus gastroscope was                            introduced through the mouth, and advanced to the                            third part of duodenum. The upper GI endoscopy was                            accomplished without difficulty. The ERCP [id number]                            was introduced through the mouth, and advanced to                            the second part of duodenum. The PCF-H190DL                            ([id number]) Olympus pediatric colonscope was                            introduced through the mouth, and advanced to the                            third part of duodenum. The patient tolerated the                            procedure. Scope In: Scope Out: Findings:      Dark mucosal changes were found diffusely throughout the distal       oropharynx.      No gross lesions were noted in the entire esophagus.      The Z-line was regular and was found 36 cm from the incisors.  Patchy moderate inflammation characterized by congestion (edema),       erythema and granularity was found in the gastric body and in the       gastric antrum.      No other gross lesions were noted in the entire examined stomach.       Biopsies were taken with a cold forceps for histology and Helicobacter       pylori testing.      A single 10 mm sessile polyp with no bleeding was found in the second       portion of the duodenum (proximal to ampulla). Preparations were made       for mucosal resection with best views with Duodenoscope. NBI imaging and       White-light endoscopy was done to demarcate the borders of the lesion.       Orise gel was injected to raise the lesion. Piecemeal mucosal resection       using a snare was performed. Resection and retrieval were complete. To       prevent  bleeding after mucosal resection, two hemostatic clips were       successfully placed (MR conditional). There was no bleeding at the end       of the procedure. Area was tattooed with an injection of Spot (carbon       black) just proximal to the site of resection.      A single 8 mm floppy polypoid lesion consistent with the area of the       minor papilla was found. Biopsies were taken with a cold forceps for       histology to rule out adenomatous tissue.      The major papilla was normal.      A single 4 mm sessile polyp with no bleeding was found in the third       portion of the duodenum. The polyp was removed with a cold snare.       Resection and retrieval were complete. To prevent bleeding after the       polypectomy, one hemostatic clip was successfully placed (MR       conditional). There was no bleeding at the end of the procedure. Area       was tattooed with an injection of Spot (carbon black) just proximal to       the site of resection. Impression:               - Dark mucosal changes consistent with potential                            Iron deposition vs other etiology were found                            diffusely throughout the distal oropharynx - needs                            ENT evaluation.                           - No gross lesions in esophagus. Z-line regular, 36                            cm from the incisors.                           -  Moderate gastritis in body/antrum. No other gross                            lesions in the stomach. Biopsied.                           - A single duodenal polyp in D2 (proximal to                            ampulla). Resected via piecemeal mucosal resection                            and retrieved. Clips (MR conditional) were placed.                            Tattooed.                           - A floppy minor papilla was noted that had some                            semblence of nodularity. Biopsied to rule out                             adenomatous change.                           - Normal major papilla.                           - A single duodenal polyp in D3. Resected and                            retrieved. Clip (MR conditional) was placed.                            Tattooed. Moderate Sedation:      Not Applicable - Patient had care per Anesthesia. Recommendation:           - Proceed to scheduled colonoscopy.                           - Continue present medications.                           - Start Carafate 1 g with each meal and at bedtime                            for the next 2-3 weeks.                           - Await pathology results.                           - Observe patient's clinical course.                           -  Monitor for signs/symptoms of bleeding,                            perforation, and infection. If issues please call                            our number to get further assistance as needed.                           - Await pathology results.                           - If no evidence of adenomatous change on the minor                            papilla biopsies then repeat upper endoscopy in 1                            year for surveillance. If evidence of adenomatous                            change in minor papilla biopsies then will need                            MRI/MRCP to rule out pancreatic divisum and an EUS                            to consider resection with minor ampullectomy ERCP.                           - ENT evaluation recommended for the oropharynx                            mucosal changes.                           - The findings and recommendations were discussed                            with the patient.                           - The findings and recommendations were discussed                            with the patient's family. Procedure Code(s):        --- Professional ---                           [phone],  Esophagogastroduodenoscopy, flexible,                            transoral; with endoscopic mucosal resection Diagnosis Code(s):        --- Professional ---  K29.70, Gastritis, unspecified, without bleeding                           K31.7, Polyp of stomach and duodenum                           K31.89, Other diseases of stomach and duodenum                           R12, Heartburn CPT copyright 2019 American Medical Association. All rights reserved. The codes documented in this report are preliminary and upon coder review may  be revised to meet current compliance requirements. Justice Britain, MD 11/28/2020 9:33:43 AM Number of Addenda: 0

## 2020-11-28 NOTE — Anesthesia Postprocedure Evaluation (Signed)
Anesthesia Post Note  Patient: Andres White  Procedure(s) Performed: COLONOSCOPY WITH PROPOFOL (N/A ) ESOPHAGOGASTRODUODENOSCOPY (EGD) WITH PROPOFOL (N/A ) ENDOSCOPIC MUCOSAL RESECTION (N/A ) BIOPSY SUBMUCOSAL LIFTING INJECTION POLYPECTOMY HEMOSTASIS CLIP PLACEMENT SUBMUCOSAL TATTOO INJECTION     Patient location during evaluation: Endoscopy Anesthesia Type: MAC Level of consciousness: awake and alert Pain management: pain level controlled Vital Signs Assessment: post-procedure vital signs reviewed and stable Respiratory status: spontaneous breathing, nonlabored ventilation, respiratory function stable and patient connected to nasal cannula oxygen Cardiovascular status: stable and blood pressure returned to baseline Postop Assessment: no apparent nausea or vomiting Anesthetic complications: no   No complications documented.  Last Vitals:  Vitals:   11/28/20 0945 11/28/20 0950  BP: (!) 119/52 (!) 125/57  Pulse: (!) 58 (!) 54  Resp: 20 19  Temp: (!) 36.4 C   SpO2: 97% 93%    Last Pain:  Vitals:   11/28/20 0950  TempSrc:   PainSc: 0-No pain                 Belenda Cruise P Kaliah Haddaway

## 2020-11-29 ENCOUNTER — Encounter (HOSPITAL_COMMUNITY): Payer: Self-pay | Admitting: Gastroenterology

## 2020-11-30 ENCOUNTER — Ambulatory Visit (INDEPENDENT_AMBULATORY_CARE_PROVIDER_SITE_OTHER): Payer: Medicare (Managed Care) | Admitting: Family

## 2020-11-30 ENCOUNTER — Other Ambulatory Visit: Payer: Self-pay

## 2020-11-30 ENCOUNTER — Encounter: Payer: Self-pay | Admitting: Family

## 2020-11-30 VITALS — BP 104/60 | HR 79 | Temp 97.9°F | Ht 66.0 in | Wt 180.0 lb

## 2020-11-30 DIAGNOSIS — I639 Cerebral infarction, unspecified: Secondary | ICD-10-CM

## 2020-11-30 DIAGNOSIS — F0390 Unspecified dementia without behavioral disturbance: Secondary | ICD-10-CM | POA: Diagnosis not present

## 2020-11-30 DIAGNOSIS — Z8669 Personal history of other diseases of the nervous system and sense organs: Secondary | ICD-10-CM

## 2020-11-30 DIAGNOSIS — I6389 Other cerebral infarction: Secondary | ICD-10-CM | POA: Diagnosis not present

## 2020-11-30 DIAGNOSIS — M109 Gout, unspecified: Secondary | ICD-10-CM

## 2020-11-30 DIAGNOSIS — N183 Chronic kidney disease, stage 3 unspecified: Secondary | ICD-10-CM

## 2020-11-30 LAB — SURGICAL PATHOLOGY

## 2020-11-30 MED ORDER — DICLOFENAC SODIUM 1 % EX GEL: 2.0000 g | Freq: Four times a day (QID) | CUTANEOUS | 2 refills | Status: DC | PRN

## 2020-11-30 MED ORDER — HYDROCERIN EX CREA: 1.0000 "application " | TOPICAL_CREAM | Freq: Two times a day (BID) | CUTANEOUS | 1 refills | Status: DC

## 2020-11-30 NOTE — Progress Notes (Signed)
Andres White is a 75 y.o. male with the following history as recorded in EpicCare:  Patient Active Problem List   Diagnosis Date Noted  . 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  . Abnormal findings on esophagogastroduodenoscopy (EGD) 10/02/2020  . Constipation 10/02/2020  . Anemia 10/02/2020  . History of seizure disorder 10/02/2020  . History of stroke 10/02/2020  . History of colon polyps 10/02/2020  . 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 Endotracheally intubated 09/16/2020  . Community acquired pneumonia 09/16/2020  . Cervical spondylosis 09/16/2020  . 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 Status epilepticus ([place]) 09/16/2020  . CKD (chronic kidney disease) stage 3, GFR 30-59 ml/min (HCC) 09/16/2020  . Occult blood in stools   . Leukocytosis   . Hyponatremia   . History of hypertension   . Dysphagia, post-stroke   . Dysesthesia   . Neuropathic pain   . Brainstem infarct, acute ([place])   . Acute blood loss anemia   . AKI (acute kidney injury) ([place])   . Stroke (cerebrum) ([place]) 06/27/2020  . Right leg weakness   . Left carotid stenosis 04/13/2014    Current Outpatient Medications  Medication Sig Dispense Refill  . acetaminophen (TYLENOL) 325 MG tablet Take 1-2 tablets (325-650 mg total) by mouth every 4 (four) hours as needed for mild pain.    [name] allopurinol (ZYLOPRIM) 100 MG tablet TAKE 1 TABLET(100 MG) BY MOUTH DAILY (Patient taking differently: Take 100 mg by mouth in the morning.) 90 tablet 0  . aspirin EC 81 MG tablet Take 1 tablet (81 mg total) by mouth daily. (Patient taking differently: Take 81 mg by mouth in the morning.) 90 tablet 3  . atorvastatin (LIPITOR) 40 MG tablet Take 40 mg by mouth at bedtime.    . citalopram (CELEXA) 20 MG tablet Take 1 tablet (20 mg total) by mouth daily. (Patient taking differently: Take 20 mg by mouth in the morning.) 30 tablet 0  . colchicine 0.6 MG tablet TAKE 1 TABLET(0.6 MG) BY MOUTH TWICE DAILY AS NEEDED FOR GOUT FLARE  (Patient taking differently: Take 0.6 mg by mouth 2 (two) times daily as needed (gout flare).) 180 tablet 0  . Ferrous Sulfate (IRON) 325 (65 Fe) MG TABS Take 1 tablet (325 mg total) by mouth 2 (two) times daily before a meal. 409 tablet 0  . folic acid (FOLVITE) 1 MG tablet Take 2 tablets (2 mg total) by mouth daily. (Patient taking differently: Take 1 mg by mouth in the morning and at bedtime.) 180 tablet 0  . furosemide (LASIX) 20 MG tablet Take 1 tablet (20 mg total) by mouth daily. (Patient taking differently: Take 20 mg by mouth in the morning.) 90 tablet 3  . gabapentin (NEURONTIN) 100 MG capsule Take 1 capsule (100 mg total) by mouth 2 (two) times daily. 60 capsule 0  . levETIRAcetam (KEPPRA) 500 MG tablet Take 1 tablet (500 mg total) by mouth 2 (two) times daily. 180 tablet 1  . lisinopril (ZESTRIL) 20 MG tablet Take 1 tablet (20 mg total) by mouth daily. (Patient taking differently: Take 20 mg by mouth in the morning.) 90 tablet 3  . Multiple Vitamin (MULTIVITAMIN WITH MINERALS) TABS tablet Take 1 tablet by mouth daily. (Patient taking differently: Take 1 tablet by mouth daily at 12 noon.)    . pantoprazole (PROTONIX) 40 MG tablet TAKE 1 TABLET(40 MG) BY MOUTH TWICE DAILY (Patient taking differently: Take 40 mg by mouth 2 (two) times daily.) 60 tablet 1  . phenytoin (DILANTIN)  100 MG ER capsule Take 1 capsule (100 mg total) by mouth 3 (three) times daily. 270 capsule 1  . QUEtiapine (SEROQUEL) 50 MG tablet Take 1 tablet (50 mg total) by mouth at bedtime as needed. (Patient taking differently: Take 50 mg by mouth at bedtime as needed (sleep).) 90 tablet 1  . sucralfate (CARAFATE) 1 GM/10ML suspension Take 10 mLs (1 g total) by mouth 4 (four) times daily -  with meals and at bedtime. 420 mL 1  . tamsulosin (FLOMAX) 0.4 MG CAPS capsule Take 1 capsule (0.4 mg total) by mouth daily. (Patient taking differently: Take 0.4 mg by mouth in the morning.) 30 capsule 0  . diclofenac Sodium (VOLTAREN) 1 %  GEL Apply 2 g topically 4 (four) times daily as needed (pain). 100 g 2  . hydrocerin (EUCERIN) CREA Apply 1 application topically 2 (two) times daily. 454 g 1   No current facility-administered medications for this visit.    Allergies: Patient has no known allergies.  Past Medical History:  Diagnosis Date  . Arthritis   . Chronic back pain   . Duodenal adenoma   . Gout   . Hypertension   . Stroke ([place]) 06/2020  . Syncope and collapse     Past Surgical History:  Procedure Laterality Date  . BIOPSY  07/13/2020   Procedure: BIOPSY;  Surgeon: Ladene Artist, MD;  Location: [place];  Service: Endoscopy;;  . BIOPSY  11/28/2020   Procedure: BIOPSY;  Surgeon: Irving Copas., MD;  Location[facility];  Service: Gastroenterology;;  . CARDIAC CATHETERIZATION  2015   [facility]   . COLONOSCOPY WITH PROPOFOL N/A 07/13/2020   Procedure: COLONOSCOPY WITH PROPOFOL;  Surgeon: Ladene Artist, MD;  [facility]COPY;  Service: Endoscopy;  Laterality: N/A;  . COLONOSCOPY WITH PROPOFOL N/A 11/28/2020   Procedure: COLONOSCOPY WITH PROPOFOL;  Surgeon: Rush Landmark Telford Nab., MD;  Location: [facility] END[facility]COPY;  Service: Gastroenterology;  Laterality: N/A;  . ENDOSCOPIC MUCOSAL RESECTION N/A 11/28/2020   Procedure: ENDOSCOPIC MUCOSAL RESECTION;  Surgeon: Rush Landmark Telford Nab., MD;  [facility]: [facility];  Service: Gastroenterology;  Laterality: N/A;  . ESOPHAGOGASTRODUODENOSCOPY N/A 07/13/2020   Procedure: ESOPHAGOGASTRODUODENOSCOPY (EGD);  Surgeon: Ladene Artist, MD;  [facility] END[facility]COPY;  Service: Endoscopy;  Laterality: N/A;  . ESOPHAGOGASTRODUODENOSCOPY (EGD) WITH PROPOFOL N/A 11/28/2020   Procedure: ESOPHAGOGASTRODUODENOSCOPY (EGD) WITH PROPOFOL;  Surgeon: Rush Landmark Telford Nab., MD;  [facility]: [facility];  Service: Gastroenterology;  Laterality: N/A;  . HEMOSTASIS CLIP PLACEMENT  11/28/2020   Procedure: HEMOSTASIS CLIP PLACEMENT;  Surgeon: Irving Copas., MD;  Location:  [facility];  Service: Gastroenterology;;  . KNEE SURGERY Right   . POLYPECTOMY  07/13/2020   Procedure: POLYPECTOMY;  Surgeon: Ladene Artist, MD;  Location: [place];  Service: Endoscopy;;  . POLYPECTOMY  11/28/2020   Procedure: POLYPECTOMY;  Surgeon: Irving Copas., MD;  Location: [facility];  Service: Gastroenterology;;  . Maryagnes Amos INJECTION  11/28/2020   Procedure: SUBMUCOSAL LIFTING INJECTION;  Surgeon: Irving Copas., MD;  Location: [facility];  Service: Gastroenterology;;  . Lia Foyer TATTOO INJECTION  11/28/2020   Procedure: SUBMUCOSAL TATTOO INJECTION;  Surgeon: Irving Copas., MD;  [facility];  Service: Gastroenterology;;    Family History  Problem Relation Age of Onset  . Hypertension Mother   . Colon cancer Neg Hx   . Pancreatic cancer Neg Hx   . Stomach cancer Neg Hx   . Esophageal cancer Neg Hx   . Inflammatory bowel disease Neg Hx   .  Liver disease Neg Hx   . Rectal cancer Neg Hx     Social History   Tobacco Use  . Smoking status: Current Every Day Smoker    Years: 20.00    Types: Cigarettes  . Smokeless tobacco: Never Used  Substance Use Topics  . Alcohol use: Not Currently    Comment: occasional    Subjective:  Patient presents today as a new patient; accompanied by daughter who helps provide history; Extensive health history:  stroke/ epilepsy/dementia/ CKD/ anxiety/ vascular disease;  Requesting referral to HH/ evaluate for nursing/ PT and OT;   Had colonoscopy yesterday- did well/ good appetite;   Requesting refills on topical Eucerin and Voltaren;     Objective:  Vitals:   11/30/20 0908  BP: 104/60  Pulse: 79  Temp: 97.9 F (36.6 C)  TempSrc: Oral  SpO2: 98%  Weight: 180 lb (81.6 kg)  Height: 5\' 6"  (1.676 m)    General: Well developed, well nourished, in no acute distress  Skin : Warm and dry.  Head: Normocephalic and atraumatic  Eyes: Sclera and conjunctiva clear; pupils round  and reactive to light; extraocular movements intact  Ears: External normal; canals clear; tympanic membranes normal  Oropharynx: Pink, supple. No suspicious lesions  Neck: Supple without thyromegaly, adenopathy  Lungs: Respirations unlabored; clear to auscultation bilaterally without wheeze, rales, rhonchi  CVS exam: normal rate and regular rhythm.  Extremities: No edema, cyanosis, clubbing  Vessels: Symmetric bilaterally  Neurologic: Alert and oriented; speech intact; face symmetrical; in wheelchair;   Assessment:  1. Cerebrovascular accident (CVA), unspecified mechanism ([place])   2. Brainstem infarct, acute ([place])   3. History of seizure disorder   4. Dementia without behavioral disturbance, unspecified dementia type (HCC)   5. Stage 3 chronic kidney disease, unspecified whether stage 3a or 3b CKD ([place])   6. Gout, unspecified cause, unspecified chronicity, unspecified site     Plan:  Continue care with neurology/ vascular/ physical medicine and rehab/ GI as already scheduled; Order placed for [facility] including nurse, PT and OT; Encouraged to quit smoking completely;    No follow-ups on file.  Orders Placed This Encounter  Procedures  . Ambulatory referral to Home Health    Referral Priority:   Routine    Referral Type:   Home Health Care    Referral Reason:   Specialty Services Required    Requested Specialty:   [place]    Number of Visits Requested:   1    Requested Prescriptions   Signed Prescriptions Disp Refills  . diclofenac Sodium (VOLTAREN) 1 % GEL 100 g 2    Sig: Apply 2 g topically 4 (four) times daily as needed (pain).  . hydrocerin (EUCERIN) CREA 454 g 1    Sig: Apply 1 application topically 2 (two) times daily.

## 2020-12-04 ENCOUNTER — Ambulatory Visit (HOSPITAL_COMMUNITY)
Admission: RE | Admit: 2020-12-04 | Discharge: 2020-12-04 | Disposition: A | Discharge status: Home / Self Care | Payer: Medicare (Managed Care) | Source: Ambulatory Visit | Attending: Vascular Surgery | Admitting: Vascular Surgery

## 2020-12-04 ENCOUNTER — Other Ambulatory Visit: Payer: Self-pay

## 2020-12-04 ENCOUNTER — Encounter: Payer: Self-pay | Admitting: Vascular Surgery

## 2020-12-04 ENCOUNTER — Ambulatory Visit: Payer: Medicare (Managed Care) | Admitting: Vascular Surgery

## 2020-12-04 VITALS — BP 123/77 | HR 68 | Temp 97.7°F | Resp 20 | Ht 66.0 in | Wt 176.0 lb

## 2020-12-04 DIAGNOSIS — I739 Peripheral vascular disease, unspecified: Secondary | ICD-10-CM

## 2020-12-04 DIAGNOSIS — I6523 Occlusion and stenosis of bilateral carotid arteries: Secondary | ICD-10-CM

## 2020-12-04 NOTE — Progress Notes (Signed)
[facility] [place]  ASSESSMENT / PLAN: Andres White is a 75 y.o. male with atherosclerosis of native arteries of right lower extremity causing no symptoms.  Patient counseled patients with asymptomatic peripheral arterial disease or claudication have a 1-2% risk of developing chronic limb threatening ischemia, but a 15-30% risk of mortality in the next 5 years. Intervention should only be considered for medically optimized patients with disabling symptoms. .  He also has severe carotid artery stenosis (L 70% > R 20% on recent CTA - 09/16/20). MRI shows remote infarcts in bilateral hemispheres.  Recommend the following which can slow the progression of atherosclerosis and reduce the risk of major adverse cardiac / limb events:  Complete cessation from all tobacco products. Blood glucose control with goal A1c < 7%. Blood pressure control with goal blood pressure < 140/90 mmHg. Lipid reduction therapy with goal LDL-C <100 mg/dL (<70 if symptomatic from PAD).  Aspirin 81mg  PO QD.  Atorvastatin 40-80mg  PO QD (or other "high intensity" statin therapy).  Follow up with me in 1 year with repeat ABI and carotid duplex ultrasound.   CHIEF COMPLAINT: Surveillance of arterial disease  HISTORY OF PRESENT ILLNESS: Andres White is a 75 y.o. male referred to clinic for evaluation of peripheral arterial disease.  Patient suffered a recent stroke, and has been minimally ambulatory since that time.  Patient reports no symptoms typical of intermittent claudication prior to the stroke or since the stroke, but he now does not walk far enough to claudicate.  He reports no ischemic rest pain.  He reports no ischemic ulceration.  He has no sensation in the right side of his body.  Staying with his daughter who is with him today.  He continues to smoke, but is trying to quit.  Past Medical History:  Diagnosis Date  . Arthritis   . Chronic back pain   . Duodenal adenoma   . Gout   .  Hypertension   . Stroke ([place]) 06/2020  . Syncope and collapse   CKD 3   Past Surgical History:  Procedure Laterality Date  . BIOPSY  07/13/2020   Procedure: BIOPSY;  Surgeon: Ladene Artist, MD;  Location: [place];  Service: Endoscopy;;  . BIOPSY  11/28/2020   Procedure: BIOPSY;  Surgeon: Irving Copas., MD;  Location: [facility];  Service: Gastroenterology;;  . CARDIAC CATHETERIZATION  2015   [facility]   . COLONOSCOPY WITH PROPOFOL N/A 07/13/2020   Procedure: COLONOSCOPY WITH PROPOFOL;  Surgeon: Ladene Artist, MD;  Location: [facility] ENDOSCOPY;  Service: Endoscopy;  Laterality: N/A;  . COLONOSCOPY WITH PROPOFOL N/A 11/28/2020   Procedure: COLONOSCOPY WITH PROPOFOL;  Surgeon: Rush Landmark Telford Nab., MD;  [facility];  Service: Gastroenterology;  Laterality: N/A;  . ENDOSCOPIC MUCOSAL RESECTION N/A 11/28/2020   Procedure: ENDOSCOPIC MUCOSAL RESECTION;  Surgeon: Rush Landmark Telford Nab., MD;  [facility];  Service: Gastroenterology;  Laterality: N/A;  . ESOPHAGOGASTRODUODENOSCOPY N/A 07/13/2020   Procedure: ESOPHAGOGASTRODUODENOSCOPY (EGD);  Surgeon: Ladene Artist, MD;  Location: [facility] ENDOSCOPY;  Service: Endoscopy;  Laterality: N/A;  . ESOPHAGOGASTRODUODENOSCOPY (EGD) WITH PROPOFOL N/A 11/28/2020   Procedure: ESOPHAGOGASTRODUODENOSCOPY (EGD) WITH PROPOFOL;  Surgeon: Rush Landmark Telford Nab., MD;  [facility];  Service: Gastroenterology;  Laterality: N/A;  . HEMOSTASIS CLIP PLACEMENT  11/28/2020   Procedure: HEMOSTASIS CLIP PLACEMENT;  Surgeon: Irving Copas., MD;  [facility];  Service: Gastroenterology;;  . KNEE SURGERY Right   . POLYPECTOMY  07/13/2020   Procedure: POLYPECTOMY;  Surgeon: Ladene Artist, MD;  Location: [place];  Service: Endoscopy;;  . POLYPECTOMY  11/28/2020   Procedure: POLYPECTOMY;  Surgeon: Irving Copas., MD;  Location[facility];  Service: Gastroenterology;;  . Maryagnes Amos INJECTION   11/28/2020   Procedure: SUBMUCOSAL LIFTING INJECTION;  Surgeon: Irving Copas., MD;  Location: [facility];  Service: Gastroenterology;;  . Lia Foyer TATTOO INJECTION  11/28/2020   Procedure: SUBMUCOSAL TATTOO INJECTION;  Surgeon: Irving Copas., MD;  Location: [facility];  Service: Gastroenterology;;    Family History  Problem Relation Age of Onset  . Hypertension Mother   . Colon cancer Neg Hx   . Pancreatic cancer Neg Hx   . Stomach cancer Neg Hx   . Esophageal cancer Neg Hx   . Inflammatory bowel disease Neg Hx   . Liver disease Neg Hx   . Rectal cancer Neg Hx     Social History   Socioeconomic History  . Marital status: Single    Spouse name: Not on file  . Number of children: Not on file  . Years of education: Not on file  . Highest education level: Not on file  Occupational History  . Not on file  Tobacco Use  . Smoking status: Current Every Day Smoker    Years: 20.00    Types: Cigarettes  . Smokeless tobacco: Never Used  Vaping Use  . Vaping Use: Never used  Substance and Sexual Activity  . Alcohol use: Not Currently    Comment: occasional  . Drug use: No  . Sexual activity: Not on file  Other Topics Concern  . Not on file  Social History Narrative   Lives with daughter, son in law and 2 grandchildren   Right Handed   Drinks 1 cup caffeine 4 times a week-ish   Social Determinants of Health   Financial Resource Strain: Not on file  Food Insecurity: Not on file  Transportation Needs: Not on file  Physical Activity: Not on file  Stress: Not on file  Social Connections: Not on file  Intimate Partner Violence: Not on file    No Known Allergies  Current Outpatient Medications  Medication Sig Dispense Refill  . acetaminophen (TYLENOL) 325 MG tablet Take 1-2 tablets (325-650 mg total) by mouth every 4 (four) hours as needed for mild pain.    [name] allopurinol (ZYLOPRIM) 100 MG tablet TAKE 1 TABLET(100 MG) BY MOUTH DAILY (Patient taking  differently: Take 100 mg by mouth in the morning.) 90 tablet 0  . aspirin EC 81 MG tablet Take 1 tablet (81 mg total) by mouth daily. (Patient taking differently: Take 81 mg by mouth in the morning.) 90 tablet 3  . atorvastatin (LIPITOR) 40 MG tablet Take 40 mg by mouth at bedtime.    . citalopram (CELEXA) 20 MG tablet Take 1 tablet (20 mg total) by mouth daily. (Patient taking differently: Take 20 mg by mouth in the morning.) 30 tablet 0  . colchicine 0.6 MG tablet TAKE 1 TABLET(0.6 MG) BY MOUTH TWICE DAILY AS NEEDED FOR GOUT FLARE (Patient taking differently: Take 0.6 mg by mouth 2 (two) times daily as needed (gout flare).) 180 tablet 0  . diclofenac Sodium (VOLTAREN) 1 % GEL Apply 2 g topically 4 (four) times daily as needed (pain). 100 g 2  . Ferrous Sulfate (IRON) 325 (65 Fe) MG TABS Take 1 tablet (325 mg total) by mouth 2 (two) times daily before a meal. [id number] tablet 0  . folic acid (FOLVITE) 1 MG  tablet Take 2 tablets (2 mg total) by mouth daily. (Patient taking differently: Take 1 mg by mouth in the morning and at bedtime.) 180 tablet 0  . furosemide (LASIX) 20 MG tablet Take 1 tablet (20 mg total) by mouth daily. (Patient taking differently: Take 20 mg by mouth in the morning.) 90 tablet 3  . gabapentin (NEURONTIN) 100 MG capsule Take 1 capsule (100 mg total) by mouth 2 (two) times daily. 60 capsule 0  . hydrocerin (EUCERIN) CREA Apply 1 application topically 2 (two) times daily. 454 g 1  . levETIRAcetam (KEPPRA) 500 MG tablet Take 1 tablet (500 mg total) by mouth 2 (two) times daily. 180 tablet 1  . lisinopril (ZESTRIL) 20 MG tablet Take 1 tablet (20 mg total) by mouth daily. (Patient taking differently: Take 20 mg by mouth in the morning.) 90 tablet 3  . Multiple Vitamin (MULTIVITAMIN WITH MINERALS) TABS tablet Take 1 tablet by mouth daily. (Patient taking differently: Take 1 tablet by mouth daily at 12 noon.)    . pantoprazole (PROTONIX) 40 MG tablet TAKE 1 TABLET(40 MG) BY MOUTH TWICE DAILY  (Patient taking differently: Take 40 mg by mouth 2 (two) times daily.) 60 tablet 1  . phenytoin (DILANTIN) 100 MG ER capsule Take 1 capsule (100 mg total) by mouth 3 (three) times daily. 270 capsule 1  . QUEtiapine (SEROQUEL) 50 MG tablet Take 1 tablet (50 mg total) by mouth at bedtime as needed. (Patient taking differently: Take 50 mg by mouth at bedtime as needed (sleep).) 90 tablet 1  . sucralfate (CARAFATE) 1 GM/10ML suspension Take 10 mLs (1 g total) by mouth 4 (four) times daily -  with meals and at bedtime. 420 mL 1  . tamsulosin (FLOMAX) 0.4 MG CAPS capsule Take 1 capsule (0.4 mg total) by mouth daily. (Patient taking differently: Take 0.4 mg by mouth in the morning.) 30 capsule 0   No current facility-administered medications for this visit.    REVIEW OF SYSTEMS:  [X]  denotes positive finding, [ ]  denotes negative finding Cardiac  Comments:  Chest pain or chest pressure:    Shortness of breath upon exertion:    Short of breath when lying flat:    Irregular heart rhythm:        Vascular    Pain in calf, thigh, or hip brought on by ambulation:    Pain in feet at night that wakes you up from your sleep:     Blood clot in your veins:    Leg swelling:         Pulmonary    Oxygen at home:    Productive cough:     Wheezing:         Neurologic    Sudden weakness in arms or legs:     Sudden numbness in arms or legs:     Sudden onset of difficulty speaking or slurred speech:    Temporary loss of vision in one eye:     Problems with dizziness:         Gastrointestinal    Blood in stool:     Vomited blood:         Genitourinary    Burning when urinating:     Blood in urine:        Psychiatric    Major depression:         Hematologic    Bleeding problems:    Problems with blood clotting too easily:  Skin    Rashes or ulcers:        Constitutional    Fever or chills:      PHYSICAL EXAM  Vitals:   12/04/20 1336  BP: 123/77  Pulse: 68  Resp: 20  Temp:  97.7 F (36.5 C)  SpO2: 95%  Weight: 176 lb (79.8 kg)  Height: 5\' 6"  (1.676 m)    Constitutional: chronically ill appearing. no distress. Appears well nourished.  Neurologic: CN intact. Mild, if any R sided weakness. No sensation in RUE / RLE Psychiatric: Mood and affect symmetric and appropriate. Eyes: No icterus. No conjunctival pallor. Ears, nose, throat: mucous membranes moist. Midline trachea.  Cardiac: regular rate and rhythm.  Respiratory: unlabored. Abdominal: soft, non-tender, non-distended.  Peripheral vascular:  2+ L DP  Absent R pedal pulses Extremity: No edema. No cyanosis. No pallor.  Skin: No gangrene. No ulceration.  Lymphatic: No Stemmer's sign. No palpable lymphadenopathy.  PERTINENT LABORATORY AND RADIOLOGIC DATA  Most recent CBC CBC Latest Ref Rng & Units 10/19/2020 09/20/2020 09/19/2020  WBC 4.0 - 10.5 K/uL 8.5 7.4 11.4(H)  Hemoglobin 13.0 - 17.0 g/dL 10.6(L) 10.8(L) 10.1(L)  Hematocrit 39.0 - 52.0 % 32.4(L) 34.5(L) 32.7(L)  Platelets 150.0 - 400.0 K/uL 237.0 215 166     Most recent CMP CMP Latest Ref Rng & Units 10/19/2020 09/22/2020 09/20/2020  Glucose 70 - 99 mg/dL 76 103(H) 105(H)  BUN 6 - 23 mg/dL 15 8 5(L)  Creatinine 0.40 - 1.50 mg/dL 1.18 1.00 0.88  Sodium 135 - 145 mEq/L 140 136 137  Potassium 3.5 - 5.1 mEq/L 3.7 3.6 3.2(L)  Chloride 96 - 112 mEq/L 107 103 103  CO2 19 - 32 mEq/L 23 19(L) 21(L)  Calcium 8.4 - 10.5 mg/dL 8.9 9.0 9.1  Total Protein 6.5 - 8.1 g/dL - - -  Total Bilirubin 0.3 - 1.2 mg/dL - - -  Alkaline Phos 38 - 126 U/L - - -  AST 15 - 41 U/L - - -  ALT 0 - 44 U/L - - -    Renal function CrCl cannot be calculated (Patient's most recent lab result is older than the maximum 21 days allowed.).  Hgb A1c MFr Bld (%)  Date Value  09/17/2020 4.5 (L)    LDL Cholesterol  Date Value Ref Range Status  09/18/2020 19 0 - 99 mg/dL Final    Comment:           Total Cholesterol/HDL:CHD Risk Coronary Heart Disease Risk Table                      Men   Women  1/2 Average Risk   3.4   3.3  Average Risk       5.0   4.4  2 X Average Risk   9.6   7.1  3 X Average Risk  23.4   11.0        Use the calculated Patient Ratio above and the CHD Risk Table to determine the patient's CHD Risk.        ATP III CLASSIFICATION (LDL):  <100     mg/dL   Optimal  100-129  mg/dL   Near or Above                    Optimal  130-159  mg/dL   Borderline  160-189  mg/dL   High  >190     mg/dL   Very High Performed at [facility]  [facility], [address], [place], [place] [address]      Vascular Imaging: ABI 12/04/20   Yevonne Aline. Stanford Breed, MD [facility] Phone Number: [phone] 12/04/2020 8:39 AM

## 2020-12-05 ENCOUNTER — Other Ambulatory Visit: Payer: Medicare (Managed Care)

## 2020-12-07 ENCOUNTER — Encounter: Payer: Self-pay | Admitting: Gastroenterology

## 2020-12-13 ENCOUNTER — Encounter: Payer: Self-pay | Admitting: Physical Medicine and Rehabilitation

## 2020-12-13 ENCOUNTER — Encounter: Payer: Medicare (Managed Care) | Attending: Registered Nurse | Admitting: Physical Medicine and Rehabilitation

## 2020-12-13 ENCOUNTER — Other Ambulatory Visit: Payer: Self-pay

## 2020-12-13 ENCOUNTER — Other Ambulatory Visit: Payer: Self-pay | Admitting: Physical Medicine and Rehabilitation

## 2020-12-13 VITALS — BP 126/77 | HR 61 | Temp 98.0°F | Ht 67.0 in | Wt 183.0 lb

## 2020-12-13 DIAGNOSIS — I10 Essential (primary) hypertension: Secondary | ICD-10-CM | POA: Diagnosis present

## 2020-12-13 DIAGNOSIS — D509 Iron deficiency anemia, unspecified: Secondary | ICD-10-CM | POA: Insufficient documentation

## 2020-12-13 DIAGNOSIS — M792 Neuralgia and neuritis, unspecified: Secondary | ICD-10-CM | POA: Diagnosis present

## 2020-12-13 DIAGNOSIS — E663 Overweight: Secondary | ICD-10-CM | POA: Diagnosis present

## 2020-12-13 MED ORDER — GABAPENTIN 300 MG PO CAPS: 300.0000 mg | ORAL_CAPSULE | Freq: Three times a day (TID) | ORAL | 3 refills | Status: DC

## 2020-12-13 MED ORDER — SUCRALFATE 1 G PO TABS: 1.0000 g | ORAL_TABLET | Freq: Three times a day (TID) | ORAL | 3 refills | Status: DC

## 2020-12-13 MED ORDER — TAMSULOSIN HCL 0.4 MG PO CAPS: 0.8000 mg | ORAL_CAPSULE | Freq: Every day | ORAL | 0 refills | Status: DC

## 2020-12-13 MED ORDER — AMLODIPINE BESYLATE 10 MG PO TABS: 10.0000 mg | ORAL_TABLET | Freq: Every day | ORAL | 3 refills | Status: DC

## 2020-12-13 NOTE — Progress Notes (Signed)
Subjective:    Patient ID: Andres White, male    DOB: Jun 29, 1946, 75 y.o.   MRN: [id number]  HPI:  1) Acute brainstem infarct, sequelae: - ISSAC MOURE is a 75 y.o. male who is here for f/u appointment of his Brainstem Infarct acute, Right Leg weakness, occult blood in stools,  and left carotid stenosis. Mr. Selders went to [facility] on 06/24/2020 for right leg weakness. Neurology consulted.   -CT Head WO Contrast:  IMPRESSION: No acute intracranial hemorrhage or mass effect. Multiple chronic infarcts and moderate chronic microvascular ischemic changes. Age indeterminate though probably chronic infarct of the left thalamus. MR Brain WO Contrast:  IMPRESSION: Small acute infarcts of the left midbrain.  Several chronic infarcts as described. Moderate chronic microvascular ischemic changes.  US Carotid Bilateral:  IMPRESSION: Right:  Color duplex indicates moderate heterogeneous and calcified plaque, with no hemodynamically significant stenosis by duplex criteria in the extracranial cerebrovascular circulation.  Left:  Heterogeneous and partially calcified plaque at the left carotid bifurcation, with discordant results regarding degree of stenosis by established duplex criteria. Peak velocity suggests 70% - 99% stenosis, with the ICA/ CCA ratio suggesting a lesser degree of stenosis. If establishing a more accurate degree of stenosis is required, cerebral angiogram should be considered, or as a second best test, CTA. Per Discharge Summary: Reesa Chew PA-C: Neurology recommended aspirin and statin for secondary stroke prevention.  Cardiology was consulted , he had bouts of asymptomatic NSVT, cardiology following as outpatient.   Mr. Polak was admitted to inpatient rehabilitation on 06/27/2020 and he was discharged to daughter's home on 07/18/2020. He's receiving outpatient therapy at [facility]. He denies any pain at this time. He rated his pain on his Health and  History 3. Also reports he has a good appetite.  Daughter in room, all questions answered.  Heather [name] daughter has established PCP care for him in [place].   Current mobility: Able to ambulated very limited distances due to his pain  2) Left carotid stenosis -Has followed up with cardiology.   4) Occult blood in stool -Hgb 10.6 on 3/23. Discussed with patient and daughter.   5) Intermittent, tingling, and aching pain, neuropathic: -increase Gabapentin at night  6) Hypertension -much better controlled! -but lisinopril causes cough  7) Overweight: weight 183- lost 5 lbs!  8) Gout: currently having a flare with 10/10 pain- taking one of colchicine daily without benefit. Does not drink alcohol. Eats meat.  9) Active smoker: Smokes 10 cigarettes per day. Would like to stop.    Pain Inventory Average Pain 0 Pain Right Now 0 My pain is no pain  LOCATION OF PAIN  Right wrist, right hand, right leg & right ankle.  BOWEL Number of stools per week: 2 Oral laxative use No  Type of laxative None Enema or suppository use No  History of colostomy No  Incontinent No   BLADDER Normal and Pads In and out cath, frequency N/A Able to self cath No  Bladder incontinence Yes  Frequent urination Yes  Leakage with coughing No  Difficulty starting stream No  Incomplete bladder emptying Yes    Mobility walk with assistance use a walker how many minutes can you walk? 3 MINS ability to climb steps?  yes do you drive?  no use a wheelchair Do you have any goals in this area?  yes  Function retired I need assistance with the following:  feeding, dressing, bathing, toileting, meal prep, household duties and shopping Do  you have any goals in this area?  yes  Neuro/Psych bladder control problems bowel control problems weakness numbness trouble walking confusion anxiety  Prior Studies Any changes since last visit?  no  Physicians involved in your care Any  changes since last visit?  no   Family History  Problem Relation Age of Onset  . Hypertension Mother   . Colon cancer Neg Hx   . Pancreatic cancer Neg Hx   . Stomach cancer Neg Hx   . Esophageal cancer Neg Hx   . Inflammatory bowel disease Neg Hx   . Liver disease Neg Hx   . Rectal cancer Neg Hx    Social History   Socioeconomic History  . Marital status: Single    Spouse name: Not on file  . Number of children: Not on file  . Years of education: Not on file  . Highest education level: Not on file  Occupational History  . Not on file  Tobacco Use  . Smoking status: Current Every Day Smoker    Packs/day: 1.00    Years: 20.00    Pack years: 20.00    Types: Cigarettes  . Smokeless tobacco: Never Used  Vaping Use  . Vaping Use: Never used  Substance and Sexual Activity  . Alcohol use: Not Currently    Comment: occasional  . Drug use: No  . Sexual activity: Not on file  Other Topics Concern  . Not on file  Social History Narrative   Lives with daughter, son in law and 2 grandchildren   Right Handed   Drinks 1 cup caffeine 4 times a week-ish   Social Determinants of Health   Financial Resource Strain: Not on file  Food Insecurity: Not on file  Transportation Needs: Not on file  Physical Activity: Not on file  Stress: Not on file  Social Connections: Not on file   Past Surgical History:  Procedure Laterality Date  . BIOPSY  07/13/2020   Procedure: BIOPSY;  Surgeon: Ladene Artist, MD;  Location: [facility];  Service: Endoscopy;;  . BIOPSY  11/28/2020   Procedure: BIOPSY;  Surgeon: Irving Copas., MD;  Location: [facility];  Service: Gastroenterology;;  . CARDIAC CATHETERIZATION  2015   [facility]   . COLONOSCOPY WITH PROPOFOL N/A 07/13/2020   Procedure: COLONOSCOPY WITH PROPOFOL;  Surgeon: Ladene Artist, MD;  [facility] ENDOSCOPY;  Service: Endoscopy;  Laterality: N/A;  . COLONOSCOPY WITH PROPOFOL N/A 11/28/2020   Procedure: COLONOSCOPY WITH  PROPOFOL;  Surgeon: Rush Landmark Telford Nab., MD;  Location: [facility] ENDOSCOPY;  Service: Gastroenterology;  Laterality: N/A;  . ENDOSCOPIC MUCOSAL RESECTION N/A 11/28/2020   Procedure: ENDOSCOPIC MUCOSAL RESECTION;  Surgeon: Rush Landmark Telford Nab., MD;  Location: [facility] ENDOSCOPY;  Service: Gastroenterology;  Laterality: N/A;  . ESOPHAGOGASTRODUODENOSCOPY N/A 07/13/2020   Procedure: ESOPHAGOGASTRODUODENOSCOPY (EGD);  Surgeon: Ladene Artist, MD;  [facility] END[facility];  Service: Endoscopy;  Laterality: N/A;  . ESOPHAGOGASTRODUODENOSCOPY (EGD) WITH PROPOFOL N/A 11/28/2020   Procedure: ESOPHAGOGASTRODUODENOSCOPY (EGD) WITH PROPOFOL;  Surgeon: Rush Landmark Telford Nab., MD;  Location: [facility] ENDOSCOPY;  Service: Gastroenterology;  Laterality: N/A;  . HEMOSTASIS CLIP PLACEMENT  11/28/2020   Procedure: HEMOSTASIS CLIP PLACEMENT;  Surgeon: Irving Copas., MD;  Location: [facility] ENDOSCOPY;  Service: Gastroenterology;;  . KNEE SURGERY Right   . POLYPECTOMY  07/13/2020   Procedure: POLYPECTOMY;  Surgeon: Ladene Artist, MD;  Location: [facility];  Service: Endoscopy;;  . POLYPECTOMY  11/28/2020   Procedure: POLYPECTOMY;  Surgeon: Irving Copas., MD;  Location:  [facility] ENDOSCOPY;  Service: Gastroenterology;;  . Lia Foyer LIFTING INJECTION  11/28/2020   Procedure: SUBMUCOSAL LIFTING INJECTION;  Surgeon: Irving Copas., MD;  Location: [facility];  Service: Gastroenterology;;  . Lia Foyer TATTOO INJECTION  11/28/2020   Procedure: SUBMUCOSAL TATTOO INJECTION;  Surgeon: Irving Copas., MD;  Location: [facility];  Service: Gastroenterology;;   Past Medical History:  Diagnosis Date  . Arthritis   . Chronic back pain   . Duodenal adenoma   . Gout   . Hypertension   . Stroke ([place]) 06/2020  . Syncope and collapse    There were no vitals taken for this visit.  Opioid Risk Score:   Fall Risk Score:  `1  Depression screen PHQ 2/9  Depression screen PHQ 2/9 09/14/2020  Decreased  Interest 2  Down, Depressed, Hopeless 0  PHQ - 2 Score 2  Altered sleeping 2  Tired, decreased energy 2  Change in appetite 0  Feeling bad or failure about yourself  1  Trouble concentrating 1  Moving slowly or fidgety/restless 1  Suicidal thoughts 0  PHQ-9 Score 9   Review of Systems  Constitutional: Negative.   Eyes: Negative.   Respiratory: Positive for shortness of breath.   Cardiovascular: Negative.   Gastrointestinal: Positive for constipation.  Endocrine: Negative.   Genitourinary: Positive for frequency.  Musculoskeletal: Positive for gait problem.       Right sided pain & weakness  Skin: Negative.   Allergic/Immunologic: Negative.   Neurological: Positive for tremors, weakness and numbness.  Hematological: Negative.   Psychiatric/Behavioral: Positive for confusion.       Anxiety  All other systems reviewed and are negative.      Objective:    Physical Exam Gen: no distress, normal appearing HEENT: oral mucosa pink and moist, NCAT Cardio: Reg rate Chest: normal effort, normal rate of breathing Abd: soft, non-distended Ext: no edema Psych: pleasant, normal affect Musculoskeletal:     Cervical back: Normal range of motion and neck supple.     Comments: Normal Muscle Bulk and Muscle Testing Reveals:  Upper Extremities: Full ROM and Muscle Strength on Right 3/5 and Left 5/5 Lower Extremities: Full ROM and Muscle Strength 5/5 Arises from Table slowly using walker for support Narrow Based  Gait      Assessment & Plan:  1.Brainstem Infarct acute:Right Leg Weakness: Continue outpatient Therapy with [facility]. Neurology Following. Continue to monitor.  2.Occult Blood in Stools: Gastroenterology Following Dr Fuller Plan.  He's scheduled for Colonoscopy. Hgb reviewed and was 10.6 on 3/18. Continue to monitor.  3.Left carotid stenosis.Cardiology Following: Continue cardiology follow-up  4. Neuropathy: increase gabapentin to 300mg  TID 5. Right leg weakness:  Provided with handicap placard  6.HTN: -tremendous improvement but cough with lisinopril: stop lisinopril and start amlodipine 10mg  daily and increase flomax to 0.8mg .  -Advised checking BP daily at home and logging results to bring into follow-up appointment with her PCP and myself. -Advised regarding healthy foods that can help lower blood pressure and provided with a list: 1) citrus foods 2) salmon and other fatty fish 3) swiss chard (leafy green) 4) pumpkin seeds 5) Beans and lentils 6) Berries 7) Amaranth (whole grain, can be cooked similarly to rice and oats) 8) Pistachios 9) Carrots 10) Celery 11) Tomatoes 12) Broccoli 13) Greek yogurt 14) Herbs and spices: Celery seed, cilantro, saffron, lemongrass, black cumin, ginseng, cinnamon, cardamom, sweet basil, and ginger 15) Chia and flax seeds 16) Beets 17) spinach -Educated that goal BP is 120/80. -Made goal  to incorporate some of the above foods into her diet.   7. Overweight: lost 5 lbs! Commended on progreass -Discussed foods that can assist in weight loss:  1) Eggs  2) Leafy greens  3) Salmon  4) Cruciferous vegetables  5) Lean beef and chicken breast  6) Boiled potatoes  7) Tuna  8) Beans and legumes  9) Soups  10) Cottage cheese  11) Avocados  12) Apple cider vinegar  13) Nuts  14) Whole grains  15) Chili pepper  16) Fruit- berries are some of the best  17) Grapefruit  18) Chia seeds  19) Coconut oil  20) Full-fat yogurt  8. PAD: referred to vascular clinic  9. Iron deficiency anemia: -repeat iron level today  9. Active smoker: Pprescribed chantix. Discussed benefits of stopping smoking cold [place]. Discussed patch, but he did not have good benefit in the past. Can consider bupropion if chantix does not help. Made goal to decrease to stop cold [place] or at least decrease to 9 cigarettes, and to decrease by 1 cigarette each week. Counseled regarding risks of lung cancer, PAD, COPD, and in harming family  members via secondhand smoke

## 2020-12-19 ENCOUNTER — Ambulatory Visit: Payer: Medicare Other | Admitting: Neurology

## 2021-01-07 NOTE — Progress Notes (Signed)
Cardiology Clinic Note   Patient Name: Andres White Date of Encounter: 01/09/2021  Primary Care Provider:  Marrian Salvage, [place] Primary Cardiologist:  Evalina Field, MD  Patient Profile    Andres White 75 year old male presents to the clinic today for follow-up evaluation of his bradycardia.  Past Medical History    Past Medical History:  Diagnosis Date  . Arthritis   . Chronic back pain   . Duodenal adenoma   . Gout   . Hypertension   . Stroke ([place]) 06/2020  . Syncope and collapse    Past Surgical History:  Procedure Laterality Date  . BIOPSY  07/13/2020   Procedure: BIOPSY;  Surgeon: Ladene Artist, MD;  Location: [facility];  Service: Endoscopy;;  . BIOPSY  11/28/2020   Procedure: BIOPSY;  Surgeon: Irving Copas., MD;  Location: [facility];  Service: Gastroenterology;;  . CARDIAC CATHETERIZATION  2015   [facility]   . COLONOSCOPY WITH PROPOFOL N/A 07/13/2020   Procedure: COLONOSCOPY WITH PROPOFOL;  Surgeon: Ladene Artist, MD;  Location: [facility] ENDOSCOPY;  Service: Endoscopy;  Laterality: N/A;  . COLONOSCOPY WITH PROPOFOL N/A 11/28/2020   Procedure: COLONOSCOPY WITH PROPOFOL;  Surgeon: Rush Landmark Telford Nab., MD;  Location: [facility] ENDOSCOPY;  Service: Gastroenterology;  Laterality: N/A;  . ENDOSCOPIC MUCOSAL RESECTION N/A 11/28/2020   Procedure: ENDOSCOPIC MUCOSAL RESECTION;  Surgeon: Rush Landmark Telford Nab., MD;  Location: [facility] ENDOSCOPY;  Service: Gastroenterology;  Laterality: N/A;  . ESOPHAGOGASTRODUODENOSCOPY N/A 07/13/2020   Procedure: ESOPHAGOGASTRODUODENOSCOPY (EGD);  Surgeon: Ladene Artist, MD;  Location: [facility] ENDOSCOPY;  Service: Endoscopy;  Laterality: N/A;  . ESOPHAGOGASTRODUODENOSCOPY (EGD) WITH PROPOFOL N/A 11/28/2020   Procedure: ESOPHAGOGASTRODUODENOSCOPY (EGD) WITH PROPOFOL;  Surgeon: Rush Landmark Telford Nab., MD;  Location: [facility] ENDOSCOPY;  Service: Gastroenterology;  Laterality: N/A;  . HEMOSTASIS CLIP PLACEMENT  11/28/2020   Procedure:  HEMOSTASIS CLIP PLACEMENT;  Surgeon: Irving Copas., MD;  Location: [facility] ENDOSCOPY;  Service: Gastroenterology;;  . KNEE SURGERY Right   . POLYPECTOMY  07/13/2020   Procedure: POLYPECTOMY;  Surgeon: Ladene Artist, MD;  Location: [facility];  Service: Endoscopy;;  . POLYPECTOMY  11/28/2020   Procedure: POLYPECTOMY;  Surgeon: Irving Copas., MD;  Location: [facility]OS[facility];  Service: Gastroenterology;;  . Maryagnes Amos INJECTION  11/28/2020   Procedure: SUBMUCOSAL LIFTING INJECTION;  Surgeon: Irving Copas., MD;  Location: [facility] ENDOSCOPY;  Service: Gastroenterology;;  . Lia Foyer TATTOO INJECTION  11/28/2020   Procedure: SUBMUCOSAL TATTOO INJECTION;  Surgeon: Irving Copas., MD;  Location: [facility] ENDOSCOPY;  Service: Gastroenterology;;    Allergies  No Known Allergies  History of Present Illness    Andres White has a PMH of CVA, HTN, tobacco abuse, HLD, carotid stenosis, and bradycardia.  He was seen and evaluated in January for ventricular tachycardia which was identified on monitor.  A nuclear stress test was normal.  His echocardiogram was normal.  His heart rate was bradycardic during his follow-up appointment 10/24/2020.  He was seen by Dr. Audie Box and presented with his daughter.  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  He had been working with physical therapy.  His heart rate had been low.  He was seen months prior and had low blood pressure at that time.  His blood pressure medications were stopped and he was placed on metoprolol for PVC suppression.  His blood pressure during follow-up was elevated but he was noted to be bradycardic.  Other than working with physical therapy he  was not active.  He was also noted to have some lower extremity swelling.  He was noted to have some skin breakdown on his right leg.  It appeared to be venous insufficiency.  Lower extremity Dopplers were ordered and showed no evidence of DVT.   His metoprolol was stopped and he was started on lisinopril-HCTZ.  Lower extremity compression stockings were recommended and low-salt diet.  His EKG showed sinus bradycardia with a heart rate of 52 and no ischemic changes.  Old anterior infarct was noted.  He denied chest pain at that time his blood pressure was 150/80.  He presents the clinic today for follow-up evaluation states he feels well.  He presents with his daughter who reports that he did have some chest discomfort 2 days ago that came on at rest.  He has not had any further episodes of chest discomfort and denies exertional chest pain.  We reviewed his previous stress test and echocardiogram.  We discussed his lower extremity Dopplers and blood pressure.  He denies cardiac complaints at this time.  His blood pressure is well controlled at 126/88.  I will refill his medications, give a salty 6 diet sheet, have him increase his physical activity as tolerated and follow-up in 6 months.  Today he denies chest pain, shortness of breath, lower extremity edema, fatigue, palpitations, melena, hematuria, hemoptysis, diaphoresis, weakness, presyncope, syncope, orthopnea, and PND.   Home Medications    Prior to Admission medications   Medication Sig Start Date End Date Taking? Authorizing Provider  sucralfate (CARAFATE) 1 g tablet TAKE 1 TABLET(1 GRAM) BY MOUTH FOUR TIMES DAILY WITH MEALS AND AT BEDTIME 12/14/20   Raulkar, Clide Deutscher, MD  acetaminophen (TYLENOL) 325 MG tablet Take 1-2 tablets (325-650 mg total) by mouth every 4 (four) hours as needed for mild pain. 07/05/20   Love, [name], PA-C  allopurinol (ZYLOPRIM) 100 MG tablet TAKE 1 TABLET(100 MG) BY MOUTH DAILY Patient taking differently: Take 100 mg by mouth in the morning. 10/25/20   Raulkar, Clide Deutscher, MD  amLODipine (NORVASC) 10 MG tablet Take 1 tablet (10 mg total) by mouth daily. 12/13/20   Raulkar, Clide Deutscher, MD  aspirin EC 81 MG tablet Take 1 tablet (81 mg total) by mouth  daily. Patient taking differently: Take 81 mg by mouth in the morning. 07/18/20   Love, [name], PA-C  atorvastatin (LIPITOR) 40 MG tablet Take 40 mg by mouth at bedtime.    [provider]  citalopram (CELEXA) 20 MG tablet Take 1 tablet (20 mg total) by mouth daily. Patient taking differently: Take 20 mg by mouth in the morning. 07/18/20   Love, [name], PA-C  colchicine 0.6 MG tablet TAKE 1 TABLET(0.6 MG) BY MOUTH TWICE DAILY AS NEEDED FOR GOUT FLARE Patient taking differently: Take 0.6 mg by mouth 2 (two) times daily as needed (gout flare). 10/25/20   Raulkar, Clide Deutscher, MD  diclofenac Sodium (VOLTAREN) 1 % GEL Apply 2 g topically 4 (four) times daily as needed (pain). 11/30/20   Marrian Salvage, FNP  Ferrous Sulfate (IRON) 325 (65 Fe) MG TABS Take 1 tablet (325 mg total) by mouth 2 (two) times daily before a meal. 10/22/20 04/20/21  Ladene Artist, MD  folic acid (FOLVITE) 1 MG tablet Take 2 tablets (2 mg total) by mouth daily. Patient taking differently: Take 1 mg by mouth in the morning and at bedtime. 10/22/20 01/20/21  Ladene Artist, MD  furosemide (LASIX) 20 MG tablet Take 1  tablet (20 mg total) by mouth daily. Patient taking differently: Take 20 mg by mouth in the morning. 10/25/20 01/23/21  O'Neal, Cassie Freer, MD  gabapentin (NEURONTIN) 300 MG capsule Take 1 capsule (300 mg total) by mouth 3 (three) times daily. 12/13/20   Raulkar, Clide Deutscher, MD  hydrocerin (EUCERIN) CREA Apply 1 application topically 2 (two) times daily. 11/30/20   Marrian Salvage, FNP  levETIRAcetam (KEPPRA) 500 MG tablet Take 1 tablet (500 mg total) by mouth 2 (two) times daily. 10/24/20   Garvin Fila, MD  Multiple Vitamin (MULTIVITAMIN WITH MINERALS) TABS tablet Take 1 tablet by mouth daily. Patient taking differently: Take 1 tablet by mouth daily at 12 noon. 07/19/20   Love, [name], PA-C  pantoprazole (PROTONIX) 40 MG tablet TAKE 1 TABLET(40 MG) BY MOUTH TWICE DAILY Patient taking  differently: Take 40 mg by mouth 2 (two) times daily. 10/01/20   Raulkar, Clide Deutscher, MD  phenytoin (DILANTIN) 100 MG ER capsule Take 1 capsule (100 mg total) by mouth 3 (three) times daily. 10/24/20   Garvin Fila, MD  QUEtiapine (SEROQUEL) 50 MG tablet Take 1 tablet (50 mg total) by mouth at bedtime as needed. Patient taking differently: Take 50 mg by mouth at bedtime as needed (sleep). 10/24/20   Garvin Fila, MD  tamsulosin (FLOMAX) 0.4 MG CAPS capsule Take 2 capsules (0.8 mg total) by mouth daily after supper. 12/13/20   Raulkar, Clide Deutscher, MD    Family History    Family History  Problem Relation Age of Onset  . Hypertension Mother   . Colon cancer Neg Hx   . Pancreatic cancer Neg Hx   . Stomach cancer Neg Hx   . Esophageal cancer Neg Hx   . Inflammatory bowel disease Neg Hx   . Liver disease Neg Hx   . Rectal cancer Neg Hx    He indicated that his mother is alive. He indicated that his father is deceased. He indicated that the status of his neg hx is unknown.  Social History    Social History   Socioeconomic History  . Marital status: Single    Spouse name: Not on file  . Number of children: Not on file  . Years of education: Not on file  . Highest education level: Not on file  Occupational History  . Not on file  Tobacco Use  . Smoking status: Current Every Day Smoker    Packs/day: 1.00    Years: 20.00    Pack years: 20.00    Types: Cigarettes  . Smokeless tobacco: Never Used  Vaping Use  . Vaping Use: Never used  Substance and Sexual Activity  . Alcohol use: Not Currently    Comment: occasional  . Drug use: No  . Sexual activity: Not on file  Other Topics Concern  . Not on file  Social History Narrative   Lives with daughter, son in law and 2 grandchildren   Right Handed   Drinks 1 cup caffeine 4 times a week-ish   Social Determinants of Health   Financial Resource Strain: Not on file  Food Insecurity: Not on file  Transportation Needs: Not on file   Physical Activity: Not on file  Stress: Not on file  Social Connections: Not on file  Intimate Partner Violence: Not on file     Review of Systems    General:  No chills, fever, night sweats or weight changes.  Cardiovascular:  No chest pain, dyspnea on exertion, edema, orthopnea,  palpitations, paroxysmal nocturnal dyspnea. Dermatological: No rash, lesions/masses Respiratory: No cough, dyspnea Urologic: No hematuria, dysuria Abdominal:   No nausea, vomiting, diarrhea, bright red blood per rectum, melena, or hematemesis Neurologic:  No visual changes, wkns, changes in mental status. All other systems reviewed and are otherwise negative except as noted above.  Physical Exam    VS:  BP 126/88   Pulse 97   Ht 5\' 7"  (1.702 m)   Wt 180 lb 9.6 oz (81.9 kg)   SpO2 99%   BMI 28.29 kg/m  , BMI Body mass index is 28.29 kg/m. GEN: Well nourished, well developed, in no acute distress. HEENT: normal. Neck: Supple, no JVD, carotid bruits, or masses. Cardiac: RRR, no murmurs, rubs, or gallops. No clubbing, cyanosis, edema.  Radials/DP/PT 2+ and equal bilaterally.  Respiratory:  Respirations regular and unlabored, clear to auscultation bilaterally. GI: Soft, nontender, nondistended, BS + x 4. MS: no deformity or atrophy. Skin: warm and dry, no rash. Neuro:  Strength and sensation are intact. Psych: Normal affect.  Accessory Clinical Findings    Recent Labs: 08/30/2020: TSH 3.110 09/19/2020: ALT 14 09/20/2020: Magnesium 1.7 10/19/2020: BUN 15; Creatinine, Ser 1.18; Hemoglobin 10.6; Platelets 237.0; Potassium 3.7; Sodium 140   Recent Lipid Panel    Component Value Date/Time   CHOL 79 09/18/2020 0616   TRIG 81 09/18/2020 0616   HDL 44 09/18/2020 0616   CHOLHDL 1.8 09/18/2020 0616   VLDL 16 09/18/2020 0616   LDLCALC 19 09/18/2020 0616    ECG personally reviewed by me today- none today  Lower extremity venous Dopplers 11/06/2020 Summary:  RIGHT:  - No evidence of deep vein  thrombosis in the lower extremity.  No indirect evidence of obstruction proximal to the inguinal ligament.  - No cystic structure found in the popliteal fossa.    LEFT:  - No evidence of deep vein thrombosis in the lower extremity.  No indirect evidence of obstruction proximal to the inguinal ligament.  - No cystic structure found in the popliteal fossa.  ABIs 12/04/2020 Summary:  Right: Resting right ankle-brachial index indicates moderate right lower  extremity arterial disease. The right toe-brachial index is abnormal.   Left: Resting left ankle-brachial index indicates noncompressible left  lower extremity arteries, triphasic and biphasic waveforms suggest  adequate perfusion. The left toe-brachial index is normal.  Echocardiogram 06/25/2020 IMPRESSIONS    1. Left ventricular ejection fraction, by estimation, is 60 to 65%. The  left ventricle has normal function. The left ventricle has no regional  wall motion abnormalities. There is severe concentric left ventricular  hypertrophy. Left ventricular diastolic  parameters are consistent with Grade I diastolic dysfunction (impaired  relaxation).  2. Right ventricular systolic function is normal. The right ventricular  size is normal.  3. The mitral valve is normal in structure. Mild mitral valve  regurgitation. No evidence of mitral stenosis.  4. The aortic valve is normal in structure. Aortic valve regurgitation is  not visualized. No aortic stenosis is present.  5. The inferior vena cava is normal in size with greater than 50%  respiratory variability, suggesting right atrial pressure of 3 mmHg.   Nuclear stress test 08/08/2020   Nuclear stress EF: 59%.  The left ventricular ejection fraction is normal (55-65%).  The study is normal.  This is a low risk study.  Normal resting and stress perfusion. No ischemia or infarction EF 59%  Cardiac event monitor on 522  Patient was monitored for 14 days.  The  predominant rhythm was  sinus with an average rate of 63 bpm (range 39-146 bpm in sinus).  There were rare PACs and occasional PVCs noted. 158 episodes of wide-complex tachycardia occurred (favor ventricular tachycardia over SVT with aberrancy). Most episodes were brief, though the longest one lasted 39 seconds. Maximum rate of VT was 187 bpm.  A single atrial run lasting 6 beats was observed with a maximum rate of 103 bpm.  No prolonged pause was identified.  There were no patient triggered events.  Predominately sinus rhythm with multiple episodes of NSVT as well as one episode of sustained VT lasting 39 seconds. Findings previously discussed with patient's family. Cardiology evaluation for these results has already occurred.  Assessment & Plan   1. Bradycardia-heart rate today 97.  Beta blocking agent previously stopped.  Denies fatigue Continue to monitor   Essential hypertension-BP today 126/88.  Well-controlled at home. Continue lisinopril, HCTZ Heart healthy low-sodium diet-salty 6 given Increase physical activity as tolerated  Lower extremity edema- generalized nonpitting bilateral lower extremity edema.  Lower extremity venous Dopplers negative for DVT previously. Continue HCTZ Heart healthy low-sodium diet-salty 6 given Increase physical activity as tolerated Lower extremity support stockings Elevate lower extremities when not active  Ventricular tachycardia- no recent episodes of accelerated heart rate or irregular heartbeats.  Previously noted to have VT versus SVT on monitor.  Echocardiogram showed normal LV function.  Nuclear stress test negative for ischemia.  Beta-blocker stopped due to bradycardia. Heart healthy low-sodium diet-salty 6 given Increase physical activity as tolerated  Hyperlipidemia Left carotid stenosis-09/18/2020: Cholesterol 79; HDL 44; LDL Cholesterol 19; Triglycerides 81; VLDL 16 Continue aspirin, atorvastatin Heart healthy low-sodium  high-fiber diet Increase physical activity as tolerated  Previous lacunar infarct- neurologically intact.  No further episodes of seizure type activity. Continue aspirin, atorvastatin Heart healthy low-sodium high-fiber diet Follows with neurology  Disposition: Follow-up with Dr. Audie Box in 6 months.  Jossie Ng. Dan Dissinger NP-C    01/09/2021, 10:05 AM [place] [address] Office [phone] Fax [phone]  Notice: This dictation was prepared with Dragon dictation along with smaller phrase technology. Any transcriptional errors that result from this process are unintentional and may not be corrected upon review.  I spent 15 minutes examining this patient, reviewing medications, and using patient centered shared decision making involving her cardiac care.  Prior to her visit I spent greater than 20 minutes reviewing her past medical history,  medications, and prior cardiac tests.

## 2021-01-09 ENCOUNTER — Ambulatory Visit: Payer: Medicare (Managed Care) | Admitting: General Practice

## 2021-01-09 ENCOUNTER — Encounter: Payer: Self-pay | Admitting: General Practice

## 2021-01-09 ENCOUNTER — Other Ambulatory Visit: Payer: Self-pay

## 2021-01-09 ENCOUNTER — Ambulatory Visit: Payer: Commercial Indemnity | Admitting: Cardiovascular Disease

## 2021-01-09 VITALS — BP 126/88 | HR 97 | Ht 67.0 in | Wt 180.6 lb

## 2021-01-09 DIAGNOSIS — R600 Localized edema: Secondary | ICD-10-CM

## 2021-01-09 DIAGNOSIS — I472 Ventricular tachycardia, unspecified: Secondary | ICD-10-CM

## 2021-01-09 DIAGNOSIS — I10 Essential (primary) hypertension: Secondary | ICD-10-CM

## 2021-01-09 DIAGNOSIS — R001 Bradycardia, unspecified: Secondary | ICD-10-CM

## 2021-01-09 DIAGNOSIS — E782 Mixed hyperlipidemia: Secondary | ICD-10-CM

## 2021-01-09 DIAGNOSIS — I6381 Other cerebral infarction due to occlusion or stenosis of small artery: Secondary | ICD-10-CM

## 2021-01-09 NOTE — Patient Instructions (Signed)
Medication Instructions:  The current medical regimen is effective;  continue present plan and medications as directed. Please refer to the Current Medication list given to you today.  *If you need a refill on your cardiac medications before your next appointment, please call your pharmacy*  Lab Work:   Testing/Procedures:  NONE    NONE  Special Instructions PLEASE READ AND FOLLOW SALTY 6-ATTACHED-1,800mg  daily  PLEASE INCREASE PHYSICAL ACTIVITY AS TOLERATED  Follow-Up: Your next appointment:  6 month(s) In Person with Eleonore Chiquito, MD OR IF UNAVAILABLE [place], FNP-C  At [facility], you and your health needs are our priority.  As part of our continuing mission to provide you with exceptional heart care, we have created designated Provider Care Teams.  These Care Teams include your primary Cardiologist (physician) and Advanced Practice Providers (APPs -  Physician Assistants and Nurse Practitioners) who all work together to provide you with the care you need, when you need it.            6 SALTY THINGS TO AVOID     1,800MG  DAILY

## 2021-01-17 ENCOUNTER — Other Ambulatory Visit: Payer: Self-pay | Admitting: Gastroenterology

## 2021-02-11 ENCOUNTER — Other Ambulatory Visit: Payer: Self-pay | Admitting: Gastroenterology

## 2021-02-11 ENCOUNTER — Telehealth: Payer: Self-pay | Admitting: Physical Medicine and Rehabilitation

## 2021-02-12 MED ORDER — ALLOPURINOL 100 MG PO TABS: ORAL_TABLET | ORAL | 0 refills | Status: DC

## 2021-02-12 NOTE — Addendum Note (Signed)
Addended by: Jasmine December T on: 02/12/2021 04:37 PM   Modules accepted: Orders

## 2021-02-12 NOTE — Telephone Encounter (Signed)
Refill request for Allopurinol. Is this okay? I don't see to continue in note

## 2021-02-17 ENCOUNTER — Other Ambulatory Visit: Payer: Self-pay | Admitting: Physical Medicine and Rehabilitation

## 2021-02-20 ENCOUNTER — Other Ambulatory Visit: Payer: Self-pay | Admitting: Cardiovascular Disease

## 2021-02-21 ENCOUNTER — Other Ambulatory Visit: Payer: Self-pay | Admitting: Cardiovascular Disease

## 2021-02-21 ENCOUNTER — Telehealth: Payer: Self-pay | Admitting: Cardiovascular Disease

## 2021-02-21 NOTE — Telephone Encounter (Signed)
Andres White states they received an Rx with the incorrect DOB. She states her DOB they have on file is [birth year]. She states the DOB on the Rx is [birth year], which is what we have on file. They want to ensure that we correct this.

## 2021-02-21 NOTE — Telephone Encounter (Signed)
Returned call to [employer] with [place], she reports the pharmacist is aware that the birthday we have on file matches that on the prescription and that the issue is resolved. All concerns addressed at this time.

## 2021-02-22 NOTE — Telephone Encounter (Signed)
Refill sent 02/20/21

## 2021-02-25 ENCOUNTER — Encounter: Payer: Self-pay | Admitting: Adult Health

## 2021-02-25 ENCOUNTER — Other Ambulatory Visit: Payer: Self-pay

## 2021-02-25 ENCOUNTER — Ambulatory Visit: Payer: Medicare (Managed Care) | Admitting: Adult Health

## 2021-02-25 VITALS — BP 151/87 | HR 62 | Ht 67.0 in | Wt 182.0 lb

## 2021-02-25 DIAGNOSIS — I69319 Unspecified symptoms and signs involving cognitive functions following cerebral infarction: Secondary | ICD-10-CM | POA: Diagnosis not present

## 2021-02-25 DIAGNOSIS — R569 Unspecified convulsions: Secondary | ICD-10-CM

## 2021-02-25 DIAGNOSIS — I6381 Other cerebral infarction due to occlusion or stenosis of small artery: Secondary | ICD-10-CM

## 2021-02-25 DIAGNOSIS — I69351 Hemiplegia and hemiparesis following cerebral infarction affecting right dominant side: Secondary | ICD-10-CM | POA: Diagnosis not present

## 2021-02-25 DIAGNOSIS — G40901 Epilepsy, unspecified, not intractable, with status epilepticus: Secondary | ICD-10-CM

## 2021-02-25 DIAGNOSIS — I69398 Other sequelae of cerebral infarction: Secondary | ICD-10-CM

## 2021-02-25 DIAGNOSIS — R269 Unspecified abnormalities of gait and mobility: Secondary | ICD-10-CM

## 2021-02-25 MED ORDER — PHENYTOIN SODIUM EXTENDED 100 MG PO CAPS: ORAL_CAPSULE | ORAL | 0 refills | Status: DC

## 2021-02-25 MED ORDER — LEVETIRACETAM 500 MG PO TABS: 500.0000 mg | ORAL_TABLET | Freq: Two times a day (BID) | ORAL | 3 refills | Status: DC

## 2021-02-25 NOTE — Progress Notes (Signed)
[facility] [address] [place]. [place] [address] [phone]       OFFICE FOLLOW UP VISIT NOTE  [name] Date of Birth:  Dec 19, 1945 Medical Record Number:  [id number]   Referring MD: Reesa Chew, PA-C Reason for Referral: Stroke    Chief Complaint  Patient presents with   Follow-up    RM 2 with daughter heather Pt is well and stable, no new complications      HPI:   Today, 02/25/2021, Andres White returns for 5-monthstroke and seizure follow-up accompanied by his daughter who provides history.  He has been stable since prior visit without new stroke/TIA symptoms or seizure activity.  Reports residual right sided weakness and cognitive impairment- [facility] but stopped mid Spring due to short staffing.  He does try to stay active at home but not necessarily any type of routine physical or mental exercises.  He ambulates with rolling walker - did have 1 fall when he was not using walker thankfully without injury.  He continues to live with his daughter who provides 24/7 supervision as well as assistance for ADLs.  Cognition has been generally stable but fluctuates per daughter.  On gabapentin 300 mg TID per PMR Dr. RRanell Patrickfor neuropathy pain.  Compliant on aspirin and atorvastatin without associated side effects.  Blood pressure today 151/87.  Compliant on Keppra and Dilantin tolerating without side effects.  No further concerns at this time.   History provided for reference purposes only Update 11/08/2020 Dr. SLeonie Man He returns for follow-up after last visit 3 months ago.  Patient is referred back to see me because of new problems of [facility]  Patient will at home when family witnessed that he was not acting his usual self and was gazing upwards.  When EMS arrived they noticed a left gaze deviation and unresponsiveness to voice and following commands and possibly some left-sided  weakness.  Patient was initially brought in as a code stroke but while being evaluated was noticed is likely having a seizure because he had tonic gaze deviation with a cry and stiffening of the whole body followed by generalized shaking which lasted about a minute.  He was given IV Ativan which stopped the episode.  He was also loaded with IV Keppra.  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  EEG done on 09/17/2020 and 09/18/2020 showed moderate diffuse encephalopathy there were several episodes of even better being pushed by the staff when they noticed tremulous movements on the right but these did not have any electrographic correlates for seizures.  Patient was intubated and IV phenytoin was added to the Keppra with the got better and self extubated himself.  He finished long-term EEG monitoring which showed no definite seizure activity.  MRI scan of the brain on 09/20/2020 was motion degraded but showed no acute abnormality.  Showed evidence of old infarcts bilaterally.  Patient did have some cognitive impairment following his previous stroke when the daughter feels that this has gotten worse now.  He requires constant supervision.  He cannot be left alone.  He is able to walk with a walker but he is disoriented at baseline and requires help with most activities of daily living.  He has had no recurrent seizures of focal jerking since his being  home.  He is still on the current dosages of Keppra finder milligram twice daily and phenytoin 100 mg   3 times a day.  Is had no recurrent stroke or TIA symptoms and remains on aspirin she is tolerating well.  Blood pressure is well controlled today it is slightly elevated 160/82.  Is tolerating Lipitor well without muscle aches and pains.  Initial visit 08/30/2020 Dr. Leonie Man: Andres White is a 75 year old African-American male  seen today for initial office consultation visit for stroke.  Is accompanied by his daughter who provides history.  I also reviewed electronic medical records and imaging films in PACS.  He has a past medical history of hypertension, tobacco abuse, arthritis who presented to [facility] on [facility]/25/2020 with right leg weakness for 4 days prior to admission.  He was found to have mild right hand and more leg weakness.  MRI scan of the brain showed a left midbrain lacunar infarct.  Carotid ultrasound suggested moderate 70 to 90% left ICA stenosis which is asymptomatic.  Echocardiogram showed normal ejection fraction without cardiac source of embolism.  LDL cholesterol was 32 mg percent.  Hemoglobin A1c was 4.8.  Lower extremity venous Dopplers were negative.  Patient subsequently had 2 weeks external cardiac monitor done which was negative for paroxysmal A. fib.  Patient was started on aspirin 81 mg daily which he is tolerating well without bruising or bleeding.  He went to inpatient rehab for 4 weeks and subsequently is at home living with her daughter.  Is able to ambulate independently now though he still has some diminished fine motor skills and mild right leg dragging.  His speech is improved.  He however has significant memory loss and cognitive impairment following his stroke as per the daughter.  He is quite forgetful and cannot remember recent conversations and needs constant reminders.  He was previously living independently and managing his own affairs but daughter feels he is no longer able to do so.  Patient states he is cut back smoking significantly but still smokes a few cigarettes a day.  His blood pressure is well controlled and today it is 142/86.  There is no family history of dementia.  Patient has no prior history of seizures or significant neurological problems.   ROS:   14 system review of systems is positive for those listed in HPI and all other systems negative.      PMH:  Past Medical History:  Diagnosis Date   Arthritis    Chronic back pain    Duodenal adenoma    Gout    Hypertension    Stroke [facility]) 06/2020   Syncope and collapse     Social History:  Social History   Socioeconomic History   Marital status: Single    Spouse name: Not on file   Number of children: Not on file   Years of education: Not on file   Highest education level: Not on file  Occupational History   Not on file  Tobacco Use   Smoking status: Every Day    Packs/day: 1.00    Years: 20.00    Pack years: 20.00    Types: Cigarettes   Smokeless tobacco: Never  Vaping Use   Vaping Use: Never used  Substance and Sexual Activity   Alcohol use: Not Currently    Comment: occasional   Drug use: No   Sexual activity: Not on file  Other Topics Concern   Not on file  Social History Narrative  Lives with daughter, son in law and 2 grandchildren   Right Handed   Drinks 1 cup caffeine 4 times a week-ish   Social Determinants of Health   Financial Resource Strain: Not on file  Food Insecurity: Not on file  Transportation Needs: Not on file  Physical Activity: Not on file  Stress: Not on file  Social Connections: Not on file  Intimate Partner Violence: Not on file    Medications:   Current Outpatient Medications on File Prior to Visit  Medication Sig Dispense Refill   acetaminophen (TYLENOL) 325 MG tablet Take 1-2 tablets (325-650 mg total) by mouth every 4 (four) hours as needed for mild pain.     allopurinol (ZYLOPRIM) 100 MG tablet TAKE 1 TABLET(100 MG) BY MOUTH DAILY 90 tablet 0   amLODipine (NORVASC) 10 MG tablet Take 1 tablet (10 mg total) by mouth daily. 39 tablet 3   aspirin EC 81 MG tablet Take 1 tablet (81 mg total) by mouth daily. (Patient taking differently: Take 81 mg by mouth in the morning.) 90 tablet 3   atorvastatin (LIPITOR) 40 MG tablet Take 40 mg by mouth at bedtime.     citalopram (CELEXA) 20 MG tablet Take 1 tablet (20 mg total) by mouth  daily. (Patient taking differently: Take 20 mg by mouth in the morning.) 30 tablet 0   colchicine 0.6 MG tablet TAKE 1 TABLET(0.6 MG) BY MOUTH TWICE DAILY AS NEEDED FOR GOUT FLARE (Patient taking differently: Take 0.6 mg by mouth 2 (two) times daily as needed (gout flare).) 180 tablet 0   diclofenac Sodium (VOLTAREN) 1 % GEL Apply 2 g topically 4 (four) times daily as needed (pain). 100 g 2   Ferrous Sulfate (IRON) 325 (65 Fe) MG TABS Take 1 tablet (325 mg total) by mouth 2 (two) times daily before a meal. 360 tablet 0   gabapentin (NEURONTIN) 300 MG capsule Take 1 capsule (300 mg total) by mouth 3 (three) times daily. 90 capsule 3   hydrocerin (EUCERIN) CREA Apply 1 application topically 2 (two) times daily. 454 g 1   levETIRAcetam (KEPPRA) 500 MG tablet Take 1 tablet (500 mg total) by mouth 2 (two) times daily. 180 tablet 1   Multiple Vitamin (MULTIVITAMIN WITH MINERALS) TABS tablet Take 1 tablet by mouth daily. (Patient taking differently: Take 1 tablet by mouth daily at 12 noon.)     pantoprazole (PROTONIX) 40 MG tablet TAKE 1 TABLET(40 MG) BY MOUTH TWICE DAILY (Patient taking differently: Take 40 mg by mouth 2 (two) times daily.) 60 tablet 1   QUEtiapine (SEROQUEL) 50 MG tablet TAKE 1 TABLET BY MOUTH AT BEDTIME 30 tablet 8   sucralfate (CARAFATE) 1 g tablet TAKE 1 TABLET(1 GRAM) BY MOUTH FOUR TIMES DAILY WITH MEALS AND AT BEDTIME 368 tablet 3   tamsulosin (FLOMAX) 0.4 MG CAPS capsule Take 2 capsules (0.8 mg total) by mouth daily after supper. 30 capsule 0   furosemide (LASIX) 20 MG tablet Take 1 tablet (20 mg total) by mouth daily. (Patient taking differently: Take 20 mg by mouth in the morning.) 90 tablet 3   No current facility-administered medications on file prior to visit.    Allergies:  No Known Allergies  Physical Exam Today's Vitals   02/25/21 0838  BP: (!) 151/87  Pulse: 62  Weight: 182 lb (82.6 kg)  Height: '5\' 7"'$  (1.702 m)   Body mass index is 28.51 kg/m.   General: Frail  pleasant elderly African-American male, seated, in no evident distress  Head: head normocephalic and atraumatic.   Neck: supple with no carotid or supraclavicular bruits Cardiovascular: regular rate and rhythm, no murmurs Musculoskeletal: no deformity Skin:  no rash/petichiae Vascular:  Normal pulses all extremities  Neurologic Exam Mental Status: Awake and fully alert.  Unable to appreciate dysarthria or aphasia.  MMSE 11/30 with deficits in majority of domains except for registration and recall.  Able to follow commands without difficulty. Cranial Nerves: Pupils equal, briskly reactive to light. Extraocular movements full without nystagmus. Visual fields full to confrontation. Hearing intact. Facial sensation intact. Face, tongue, palate moves normally and symmetrically.  Motor: Normal bulk and tone. Normal strength in all tested extremity muscles except mild right grip weakness and diminished fine finger movements on the right and orbits left to right upper extremity and right ankle dorsiflexion and plantarflexion weakness Sensory.: intact to touch , pinprick , position and vibratory sensation.  Coordination: Rapid alternating movements normal on leftside. Finger-to-nose and heel-to-shin performed accurately on left side. Gait and Station: Arises from chair with difficulty. Stance is stooped.  Using a walker.  Gait demonstrates slight dragging of the right leg.  Tandem walk and heel toe not attempted Reflexes: 1+ and symmetric. Toes downgoing.   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  He has post stroke residual mild right hemiparesis as well as dementia and memory loss following his stroke which appears to have worsened after recent admission for seizures in February 2022.[name]     Left lacunar infarct:  Residual deficit: Right hemiparesis and cognitive impairment.  Referral placed to neuro rehab PT/OT/SLP for further rehab needs.  Discussed importance of use of rolling walker at all times for fall prevention unless otherwise instructed.   Vascular dementia: MMSE 11/30 (prior 14/30). Discussed importance of routine memory exercises at home as well as routine physical exercise (as tolerated), healthy diet, adequate sleep and management of risk factors.  Daughter continues to provide 24/7 supervision and care Continue aspirin 81 mg daily  and atorvastatin for secondary stroke prevention.  Close PCP f/u for aggressive stroke risk factor management  Seizures:  Stable without new seizure activity.  Continue Keppra 500 mg twice daily - per Dr. Clydene Fake recommendations, will plan on slowly decreasing Dilantin dosage as he has been stable and plan on repeating EEG in 3 months once Dilantin discontinued.  Discussed possible seizures with weaning off from Dilantin and signs/symptoms to observe for and advised to call office with any potential seizure activity HTN: BP goal <130/90.  Slightly elevated today on current regimen per PCP HLD: LDL goal <70.  Most recent LDL 19 (09/2020).  Continue atorvastatin 40 mg daily per PCP   Follow-up in 4 months or call earlier if needed   CC:  [facility] provider: Dr. Louann Sjogren, Marvis Repress, FNP   I spent 36 minutes of face-to-face and non-face-to-face time with patient and daughter.  This included previsit chart  review, lab review, study review, order entry, electronic health record documentation, patient and daughter education and discussion  regarding history of prior stroke as well as secondary stroke prevention measures and aggressive stroke risk factor management, residual deficits, seizures and use of AED as well as discontinuing Dilantin, vascular dementia with completion and review of MMSE and answered all other questions to patient's satisfaction  Frann Rider, [facility]  [facility] [address] [place], [place] [address]  Phone [phone] Fax [phone] Note: This document was prepared with digital dictation and possible smart phrase technology. Any transcriptional errors that result from this process are unintentional.

## 2021-02-25 NOTE — Patient Instructions (Addendum)
Continue Keppra 500 mg twice daily for seizure prevention  Decreased Dilantin dosage to '100mg'$  twice daily for 1 month and '100mg'$  daily for 1 month then discontinue - we will plan on repeating EEG in 3 months  Continue aspirin 81 mg daily  and atorvastatin for secondary stroke prevention  Continue to follow up with PCP regarding cholesterol and blood pressure management  Maintain strict control of hypertension with blood pressure goal below 130/90 and cholesterol with LDL cholesterol (bad cholesterol) goal below 70 mg/dL.   Referral placed to neuro rehab for physical, occupational and speech (cognitive) therapies -please call on Wednesday/Thursday to schedule initial evaluations     Followup in the future with me in 4 months or call earlier if needed       Thank you for coming to see [place] at [facility]. I hope we have been able to provide you high quality care today.  You may receive a patient satisfaction survey over the next few weeks. We would appreciate your feedback and comments so that we may continue to improve ourselves and the health of our patients.

## 2021-02-26 NOTE — Progress Notes (Signed)
I agree with the above plan 

## 2021-03-05 ENCOUNTER — Encounter: Payer: Self-pay | Admitting: Physical Therapy

## 2021-03-05 ENCOUNTER — Ambulatory Visit: Payer: Medicare (Managed Care) | Admitting: Occupational Therapy

## 2021-03-05 ENCOUNTER — Other Ambulatory Visit: Payer: Self-pay

## 2021-03-05 ENCOUNTER — Ambulatory Visit: Payer: Medicare (Managed Care) | Attending: Adult Health | Admitting: Physical Therapy

## 2021-03-05 ENCOUNTER — Ambulatory Visit: Payer: Medicare (Managed Care) | Admitting: Speech Pathology

## 2021-03-05 DIAGNOSIS — M6281 Muscle weakness (generalized): Secondary | ICD-10-CM | POA: Insufficient documentation

## 2021-03-05 DIAGNOSIS — I69351 Hemiplegia and hemiparesis following cerebral infarction affecting right dominant side: Secondary | ICD-10-CM | POA: Diagnosis present

## 2021-03-05 DIAGNOSIS — R41844 Frontal lobe and executive function deficit: Secondary | ICD-10-CM | POA: Insufficient documentation

## 2021-03-05 DIAGNOSIS — R4701 Aphasia: Secondary | ICD-10-CM

## 2021-03-05 DIAGNOSIS — R41841 Cognitive communication deficit: Secondary | ICD-10-CM | POA: Diagnosis present

## 2021-03-05 DIAGNOSIS — R2681 Unsteadiness on feet: Secondary | ICD-10-CM | POA: Insufficient documentation

## 2021-03-05 DIAGNOSIS — R278 Other lack of coordination: Secondary | ICD-10-CM | POA: Diagnosis present

## 2021-03-05 NOTE — Therapy (Signed)
[place] [place], [place], [address] Phone: [phone]   Fax:  [phone]  Physical Therapy Evaluation  Patient Details  Name: Andres White MRN: [id number] Date of Birth: 03-12-1946 Referring Provider (PT): Frann Rider, NP   Encounter Date: 03/05/2021   PT End of Session - 03/05/21 1155     Visit Number 1    Number of Visits 17    Date for PT Re-Evaluation 04/30/21    Authorization Type [employer]    PT Start Time [phone]    PT Stop Time 1010    PT Time Calculation (min) 39 min    Activity Tolerance Patient tolerated treatment well    Behavior During Therapy [facility] for tasks assessed/performed             Past Medical History:  Diagnosis Date   Arthritis    Chronic back pain    Duodenal adenoma    Gout    Hypertension    Stroke ([place]) 06/2020   Syncope and collapse     Past Surgical History:  Procedure Laterality Date   BIOPSY  07/13/2020   Procedure: BIOPSY;  Surgeon: Ladene Artist, MD;  Location: [place];  Service: Endoscopy;;   BIOPSY  11/28/2020   Procedure: BIOPSY;  Surgeon: Irving Copas., MD;  Location: [facility];  Service: Gastroenterology;;   CARDIAC CATHETERIZATION  2015   [facility]    COLONOSCOPY WITH PROPOFOL N/A 07/13/2020   Procedure: COLONOSCOPY WITH PROPOFOL;  Surgeon: Ladene Artist, MD;  Location: [facility] END[facility]COPY;  Service: Endoscopy;  Laterality: N/A;   COLONOSCOPY WITH PROPOFOL N/A 11/28/2020   Procedure: COLONOSCOPY WITH PROPOFOL;  Surgeon: Rush Landmark Telford Nab., MD;  Location: [facility] ENDOSCOPY;  Service: Gastroenterology;  Laterality: N/A;   ENDOSCOPIC MUCOSAL RESECTION N/A 11/28/2020   Procedure: ENDOSCOPIC MUCOSAL RESECTION;  Surgeon: Rush Landmark Telford Nab., MD;  Location: [facility] ENDOSCOPY;  Service: Gastroenterology;  Laterality: N/A;   ESOPHAGOGASTRODUODENOSCOPY N/A 07/13/2020   Procedure: ESOPHAGOGASTRODUODENOSCOPY (EGD);  Surgeon: Ladene Artist, MD;   Location: [facility] ENDOSCOPY;  Service: Endoscopy;  Laterality: N/A;   ESOPHAGOGASTRODUODENOSCOPY (EGD) WITH PROPOFOL N/A 11/28/2020   Procedure: ESOPHAGOGASTRODUODENOSCOPY (EGD) WITH PROPOFOL;  Surgeon: Rush Landmark Telford Nab., MD;  Location: [facility] ENDOSCOPY;  Service: Gastroenterology;  Laterality: N/A;   HEMOSTASIS CLIP PLACEMENT  11/28/2020   Procedure: HEMOSTASIS CLIP PLACEMENT;  Surgeon: Irving Copas., MD;  Location: [facility];  Service: Gastroenterology;;   KNEE SURGERY Right    POLYPECTOMY  07/13/2020   Procedure: POLYPECTOMY;  Surgeon: Ladene Artist, MD;  Location: [place];  Service: Endoscopy;;   POLYPECTOMY  11/28/2020   Procedure: POLYPECTOMY;  Surgeon: Irving Copas., MD;  Location: [facility];  Service: Gastroenterology;;   SUBMUCOSAL LIFTING INJECTION  11/28/2020   Procedure: SUBMUCOSAL LIFTING INJECTION;  Surgeon: Irving Copas., MD;  Location: [facility];  Service: Gastroenterology;;   SUBMUCOSAL TATTOO INJECTION  11/28/2020   Procedure: SUBMUCOSAL TATTOO INJECTION;  Surgeon: Irving Copas., MD;  Location: [facility] ENDOSCOPY;  Service: Gastroenterology;;    There were no vitals filed for this visit.    Subjective Assessment - 03/05/21 0934     Subjective Patient present with daughter for assistance with subjective report. Patient had a CVA in 06/2020 followed by a seizure in 09/2020. Had HHPT for 2 months. Has been walking with RW at home/outside. Has some R sided weakness remaining. Daughter reports limited blood/nerve supply to the R foot and a brace was recommended. Daughter feels that  the patient's balance is off and is very dependent on RW for walking. Denies dizziness, recent seizures, or pain in the R hemi body. Daughter reports that the patient does c/o pain sometimes.    Pertinent History chronic back pain, gout, HTN, stroke 12/21, syncope, hx seizures, dementia, R knee surgery    Limitations Lifting;Standing;Walking;House hold  activities;Sitting    How long can you stand comfortably? 1 minute limited by fatigue    How long can you walk comfortably? 1 minute limited by fatigue    Diagnostic tests 09/20/20 brain MRI: Redemonstrated small chronic infarcts within the  left frontal and parietal lobes, right temporoparietal lobes, L cerebral white  matter, right basal ganglia, bilateral thalami and bilateral cerebellar hemispheres.    Patient Stated Goals improved balance    Currently in Pain? No/denies                [facility] PT Assessment - 03/05/21 0941       Assessment   Medical Diagnosis Hemiparesis affecting right side as late effect of cerebrovascular accident, Gait disturbance, post-stroke    Referring Provider (PT) Frann Rider, NP    Onset Date/Surgical Date --   November 2021   Hand Dominance Right    Next MD Visit not scheduled    Prior Therapy HHPT      Precautions   Precautions Fall      Balance Screen   Has the patient fallen in the past 6 months Yes    How many times? 2    Has the patient had a decrease in activity level because of a fear of falling?  No    Is the patient reluctant to leave their home because of a fear of falling?  No      Home Social worker Private residence    Living Arrangements Children    Available Help at Discharge Family    Type of [place] to enter    Entrance Stairs-Number of Steps 1    [place] One level    [place] - 2 wheels;Cane - single point;Wheelchair - [employer];Shower seat    Additional Comments daughter provides 24/7 supervision at home      Prior Function   Level of [place] Retired    Leisure take care of self at home      Cognition   Overall Cognitive Status Within Functional Limits for tasks assessed      Sensation   Light Touch Appears Intact      Coordination   Gross Motor Movements are Fluid and  Coordinated Yes      Posture/Postural Control   Posture/Postural Control Postural limitations    Postural Limitations Rounded Shoulders;Forward head;Increased thoracic kyphosis      ROM / Strength   AROM / PROM / Strength AROM;Strength      AROM   AROM Assessment Site Ankle    Right/Left Ankle Right;Left    Right Ankle Dorsiflexion -19    Left Ankle Dorsiflexion 0      Strength   Strength Assessment Site Hip;Knee;Ankle    Right/Left Hip Right;Left    Right Hip Flexion 4+/5    Right Hip ABduction 4+/5    Right Hip ADduction 4+/5    Left Hip Flexion 4+/5    Left Hip ABduction 4+/5    Left Hip ADduction 4+/5    Right/Left Knee Right;Left  Right Knee Flexion 4/5    Right Knee Extension 4/5    Left Knee Flexion 4+/5    Left Knee Extension 4+/5    Right/Left Ankle Right;Left    Right Ankle Dorsiflexion 3+/5    Right Ankle Plantar Flexion 2+/5    Left Ankle Dorsiflexion 4+/5    Left Ankle Plantar Flexion 4+/5      Ambulation/Gait   Assistive device Rolling walker    Gait Pattern Step-to pattern;Decreased dorsiflexion - right;Decreased stance time - right;Decreased step length - left;Right flexed knee in stance;Left flexed knee in stance;Trunk flexed    Ambulation Surface Level;Indoor    Gait velocity decreased      Standardized Balance Assessment   Standardized Balance Assessment Timed Up and Go Test;Five Times Sit to Stand    Five times sit to stand comments  19.41   using B armrests, inability to stand up fully     Timed Up and Go Test   Normal TUG (seconds) 45.24   with RW; heavy cueing for hand placement and safety                       Objective measurements completed on examination: See above findings.               PT Education - 03/05/21 1155     Education Details prognosis, POC, HEP- to be performed with daughter supervision and counter top for safety    Person(s) Educated Patient;Child(ren)   daughter   Methods  Explanation;Demonstration;Tactile cues;Verbal cues;Handout    Comprehension Returned demonstration;Verbalized understanding              PT Short Term Goals - 03/05/21 1202       PT SHORT TERM GOAL #1   Title Patient to be mod-independent with initial HEP.    Time 3    Period Weeks    Status New    Target Date 03/26/21               PT Long Term Goals - 03/05/21 1203       PT LONG TERM GOAL #1   Title Patient to be mod-independent with advanced HEP.    Time 8    Period Weeks    Status New    Target Date 04/30/21      PT LONG TERM GOAL #2   Title Patient to demonstrate R ankle AROM to 0 degrees to improve comfort when wearing AFO.    Time 8    Period Weeks    Status New    Target Date 04/30/21      PT LONG TERM GOAL #3   Title Patient to demonstrate R LE strength >/=4/5.    Time 8    Period Weeks    Status New    Target Date 04/30/21      PT LONG TERM GOAL #4   Title Patient to complete TUG in <14 sec with LRAD in order to decrease risk of falls.    Time 8    Period Weeks    Status New    Target Date 04/30/21      PT LONG TERM GOAL #5   Title Patient to complete 5xSTS in <20 sec in order to decrease risk of falls.    Time 8    Period Weeks    Status New    Target Date 04/30/21      Additional Long Term Goals   Additional Long Term  Goals Yes      PT LONG TERM GOAL #6   Title Patient to score atleast 40/56 on Berg in order to decrease risk of falls.    Time 8    Period Weeks    Status New    Target Date 04/30/21                    Plan - 03/05/21 1156     Clinical Impression Statement Patient is a 75 y/o M presenting to OPPT with daughter for assistance with subjective report, with c/o R sided weakness since experiencing a CVA in November 2011 and seizure in February 2022. Patient participated with HHPT and now lives with daughter who provided 24/7 supervision. Patient would like to work on R sided strengthening, balance, and get  set up with an AFO as this was recommended to them by a MD. Patient has experienced 2 falls in the past 6 months, however denies dizziness or recent seizures. Patient today presenting with forward head and kyphotic posture, limited R ankle dorsiflexion AROM, decreased R knee and ankle strength, gait deviations, and poor safety awareness with transfers. Patient's scored on 5xSTS and TUG demonstrated an increased risk of falls. Patient and daughter were educated on stretching and strengthening HEP to be performed with supervision- both reported understanding. Would benefit from skilled PT services 2x/week for 8 weeks to address aforementioned impairments.    Personal Factors and Comorbidities Age;Behavior Pattern;Comorbidity 3+;Transportation;Time since onset of injury/illness/exacerbation;Past/Current Experience;Fitness    Comorbidities chronic back pain, gout, HTN, stroke 12/21, syncope, hx seizures, dementia, R knee surgery    Examination-Activity Limitations Transfers;Locomotion Level;Bed Mobility;Bathing;Reach Overhead;Bend;Sit;Carry;Squat;Dressing;Stairs;Stand;Hygiene/Grooming;Lift;Toileting    Examination-Participation Restrictions Laundry;Shop;Community Activity;Cleaning;Church;Meal Prep    Stability/Clinical Decision Making Stable/Uncomplicated    Clinical Decision Making Low    Rehab Potential Good    PT Frequency 2x / week    PT Duration 8 weeks    PT Treatment/Interventions Cryotherapy;Electrical Stimulation;DME Instruction;Ultrasound;Moist Heat;Gait training;Stair training;Functional mobility training;Therapeutic activities;Therapeutic exercise;Balance training;Neuromuscular re-education;Manual techniques;Patient/family education;Passive range of motion;Dry needling;Energy conservation;Vasopneumatic Device;Taping;ADLs/Self Care Home Management    PT Next Visit Plan reassess HEP, assess berg, progres R LE stengthening, gait/transfer training, provide info on orthotist    Consulted and Agree  with Plan of Care Patient;Family member/caregiver    Family Member Consulted daughter             Patient will benefit from skilled therapeutic intervention in order to improve the following deficits and impairments:  Abnormal gait, Decreased range of motion, Difficulty walking, Increased fascial restricitons, Increased muscle spasms, Decreased safety awareness, Decreased endurance, Decreased activity tolerance, Pain, Decreased balance, Hypomobility, Impaired flexibility, Improper body mechanics, Postural dysfunction, Decreased strength, Decreased mobility  Visit Diagnosis: Hemiplegia and hemiparesis following cerebral infarction affecting right dominant side (HCC)  Muscle weakness (generalized)  Unsteadiness on feet     Problem List 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   Abnormal findings on esophagogastroduodenoscopy (EGD) 10/02/2020   Constipation 10/02/2020   Anemia 10/02/2020   History of seizure disorder 10/02/2020   History of stroke 10/02/2020   History of colon polyps 10/02/2020   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 10/02/2020   Endotracheally intubated 09/16/2020   Community acquired pneumonia 09/16/2020   Cervical spondylosis 09/16/2020   Vertebral artery stenosis 09/16/2020   Status epilepticus ([place]) 09/16/2020   CKD (chronic kidney disease) stage 3, GFR 30-59 ml/min (HCC) 09/16/2020   Occult blood in stools    Leukocytosis  Hyponatremia    History of hypertension    Dysphagia, post-stroke    Dysesthesia    Neuropathic pain    Brainstem infarct, acute (HCC)    Acute blood loss anemia    AKI (acute kidney injury) ([facility])    Stroke (cerebrum) ([place]) 06/27/2020   Right leg weakness    Left carotid stenosis 04/13/2014     Janene Harvey, PT, DPT 03/05/21 12:09 PM   [place] [place] [place], [place], [address] Phone: [phone]    Fax:  [phone]  Name: EUAN BARBERI MRN: [id number] Date of Birth: May 16, 1946

## 2021-03-06 NOTE — Therapy (Signed)
[address]. [place], [place], [address] Phone: [phone]   Fax:  [phone]  Occupational Therapy Evaluation  Patient Details  Name: Andres White MRN: [id number] Date of Birth: January 08, 1946 Referring Provider (OT): Frann Rider, NP   Encounter Date: 03/05/2021   OT End of Session - 03/05/21 1130     Visit Number 1     17   Date for OT Re-Evaluation 06/03/21    Authorization Type [employer] Advantage    Authorization Time Period VL: [place]    OT Start Time 1015    OT Stop Time 1055    OT Time Calculation (min) 40 min    Activity Tolerance Patient tolerated treatment well    Behavior During Therapy [facility] for tasks assessed/performed             Past Medical History:  Diagnosis Date   Arthritis    Chronic back pain    Duodenal adenoma    Gout    Hypertension    Stroke ([place]) 06/2020   Syncope and collapse     Past Surgical History:  Procedure Laterality Date   BIOPSY  07/13/2020   Procedure: BIOPSY;  Surgeon: Ladene Artist, MD;  Location: [place];  Service: Endoscopy;;   BIOPSY  11/28/2020   Procedure: BIOPSY;  Surgeon: Irving Copas., MD;  Location: [facility];  Service: Gastroenterology;;   CARDIAC CATHETERIZATION  2015   [facility]    COLONOSCOPY WITH PROPOFOL N/A 07/13/2020   Procedure: COLONOSCOPY WITH PROPOFOL;  Surgeon: Ladene Artist, MD;  Location: [place];  Service: Endoscopy;  Laterality: N/A;   COLONOSCOPY WITH PROPOFOL N/A 11/28/2020   Procedure: COLONOSCOPY WITH PROPOFOL;  Surgeon: Rush Landmark Telford Nab., MD;  Location: [facility];  Service: Gastroenterology;  Laterality: N/A;   ENDOSCOPIC MUCOSAL RESECTION N/A 11/28/2020   Procedure: ENDOSCOPIC MUCOSAL RESECTION;  Surgeon: Rush Landmark Telford Nab., MD;  Location: [facility];  Service: Gastroenterology;  Laterality: N/A;   ESOPHAGOGASTRODUODENOSCOPY N/A 07/13/2020   Procedure: ESOPHAGOGASTRODUODENOSCOPY (EGD);   Surgeon: Ladene Artist, MD;  Location: [facility];  Service: Endoscopy;  Laterality: N/A;   ESOPHAGOGASTRODUODENOSCOPY (EGD) WITH PROPOFOL N/A 11/28/2020   Procedure: ESOPHAGOGASTRODUODENOSCOPY (EGD) WITH PROPOFOL;  Surgeon: Rush Landmark Telford Nab., MD;  [facility];  Service: Gastroenterology;  Laterality: N/A;   HEMOSTASIS CLIP PLACEMENT  11/28/2020   Procedure: HEMOSTASIS CLIP PLACEMENT;  Surgeon: Irving Copas., MD;  Location: [facility];  Service: Gastroenterology;;   KNEE SURGERY Right    POLYPECTOMY  07/13/2020   Procedure: POLYPECTOMY;  Surgeon: Ladene Artist, MD;  Location: [place];  Service: Endoscopy;;   POLYPECTOMY  11/28/2020   Procedure: POLYPECTOMY;  Surgeon: Irving Copas., MD;  Location: [facility];  Service: Gastroenterology;;   SUBMUCOSAL LIFTING INJECTION  11/28/2020   Procedure: SUBMUCOSAL LIFTING INJECTION;  Surgeon: Irving Copas., MD;  Location: [facility];  Service: Gastroenterology;;   SUBMUCOSAL TATTOO INJECTION  11/28/2020   Procedure: SUBMUCOSAL TATTOO INJECTION;  Surgeon: Irving Copas., MD;  [facility];  Service: Gastroenterology;;    There were no vitals filed for this visit.   Subjective Assessment - 03/05/21 1019     Subjective  Pt arrives to session w/ his daughter, who provides majority of historical report due to pt's cognitive deficits. Per pt and his daughter, participation in functional activities is decreasing w/ pt's daughter stating that she is currently "doing everything."   Patient is accompanied by: Family member   Daughter  and grandson   Pertinent History PMHx includes arthritis, chronic back pain, gout, and HTN    Limitations Cognitive/executive function deficits and aphasia   Patient Stated Goals Doing more things for myself    Currently in Pain? No/denies             [facility] OT Assessment - 03/05/21 1020       Assessment   Medical Diagnosis Hemiparesis affecting  right side as late effect of cerebrovascular accident, Gait disturbance, post-stroke    Referring Provider (OT) Frann Rider, NP    Onset Date/Surgical Date 06/24/20    Hand Dominance Right    Prior Therapy IP, HHPT/OT      Precautions   Precautions Fall      Balance Screen   Has the patient fallen in the past 6 months Yes    How many times? "A couple of times"   Usually occur getting out of the bed     Home  Environment   Type of [place] One level    Bathroom Shower/Tub Tub/Shower unit    [place] - 2 wheels;Tub bench;Wheelchair - manual;Bedside commode;Cane - single point      Prior Function   Level of Independence Independent    Vocation Retired      ADL   Eating/Feeding Needs assist with cutting food    Upper Body Bathing Minimal assistance    Lower Body Bathing Maximal assistance    Upper Body Dressing Moderate assistance    Lower Body Dressing Maximal assistance    Toilet Transfer Min guard    Toileting - Clothing Manipulation Moderate assistance    Toileting -  Hygiene Maximal assistance    Tub/Shower Transfer Maximal assistance      Vision Assessment   Tracking/Visual Pursuits Impaired - to be further tested in functional context    Saccades Decreased speed of saccadic movement    Convergence Impaired - to be further tested in functional context    Visual Fields No apparent deficits      Cognition   Overall Cognitive Status History of cognitive impairments - at baseline    Area of Impairment Orientation;Memory;Safety/judgement;Awareness;Problem solving      Sensation   Light Touch Appears Intact      Coordination   Gross Motor Movements are Fluid and Coordinated Yes    Fine Motor Movements are Fluid and Coordinated No   Finger Nose Finger Test Impaired bilaterally    9 Hole Peg Test Right;Left    Right 9 Hole Peg Test 1 min, 15 sec w/ 1 verbal cue/1 drop    Left 9 Hole Peg Test 57 sec w/ 1 verbal cue    Box and Blocks LUE  25; RUE 24   Only did 1 color at a time despite cueing     ROM / Strength   AROM / PROM / Strength AROM; Strength       AROM    Overall AROM Within functional limits for tasks performed     Strength   Strength Assessment Site Shoulder;Elbow    Right/Left Shoulder Right;Left    Right Shoulder Flexion 4-/5    Right Shoulder Extension 4/5    Left Shoulder Flexion 4/5    Left Shoulder Extension 4/5      Hand Function   Right Hand Grip (lbs) 14#    Left Hand Grip (lbs) 20#             OT Education -  03/05/21 1418     Education Details  Education provided on role and purpose of OP OT, as well as potential interventions and goals for therapy.   Person(s) Education Patient; Child(ren)   Methods Explanation   Comprehension Verbalized understanding            OT Short Term Goals - 03/05/21 1100       OT SHORT TERM GOAL #1   Title Pt will demonstrate coordination HEP w/ caregiver    Baseline No HEP at this time    Time 2    Period Weeks    Status New    Target Date 03/22/21      OT SHORT TERM GOAL #2   Title Pt will be able to don/doff overhead or open-front shirt w/ SPV and verbal cues for sequencing    Baseline Min A for UB dressing    Time 4    Period Weeks    Status New    Target Date 04/05/21      OT SHORT TERM GOAL #3   Title Pt will safely thread BLEs into bottoms/pants/shorts w/ Min A, and AE prn    Baseline Max A w/ LB dressing    Time 4    Period Weeks    Status New      OT SHORT TERM GOAL #4   Title Pt will don/doff shoes w/ Min A and AE prn    Baseline Max A w/ LB dressing    Time 4    Period Weeks    Status New      OT SHORT TERM GOAL #5   Title Pt will improve RUE coordination as evidenced by decreasing 9HPT time by at least 5 sec    Baseline 9HPT w/ R hand 1 min, 15 sec (verbal cues/1 drop); LUE 57 sec    Time 4    Period Weeks    Status New             OT Long Term Goals - 03/05/21 1100       OT LONG TERM GOAL #1   Title Pt  will demonstrate UB dressing w/ SPV, using AE and verbal/gestural cues prn    Baseline Min A w/ UB dressing    Time 8    Period Weeks    Status New    Target Date 05/03/21      OT LONG TERM GOAL #2   Title Pt will be able to complete LB dressing (bottoms, socks, shoes) w/ Min A by d/c    Baseline Max A for LB dressing    Time 8    Period Weeks    Status New      OT LONG TERM GOAL #3   Title Pt will improve grip strength in R hand by at least 5# to improve functional use of RUE (dominant) during BADLs    Baseline R hand grip 14#; L hand 20#    Time 8    Period Weeks    Status New      OT LONG TERM GOAL #4   Title Pt will be able to sequence through novel or unfamiliar task w/ Min A/verbal cues to improve participation in IADLs (laundry, dish washing, snack prep)    Baseline Minimal participation in IADLs    Time 8    Period Weeks    Status New             Plan - 03/05/21 1735  Clinical Impression Statement Pt is a 75 y/o male who presents to OP OT due to R-sided weakness and decreased participation in functional activities s/p CVA in 06/24/20 and seizure in February 2022. Patient participated with [facility] therapies and now lives with daughter who provides 24/7 SPV. PMHx includes arthritis, chronic back pain, gout, and HTN. Pt will benefit from skilled occupational therapy services to address strength and coordination, balance and functional mobility/transfers, GM/FM control, cognition, safety awareness, introduction of compensatory strategies/AE prn, visual-perception, and implementation of an HEP to improve participation and safety during ADLs and decrease caregiver strain.  OT Occupational Profile and History Detailed Assessment- Review of Records and additional review of physical, cognitive, psychosocial history related to current functional performance  Occupational performance deficits (Please refer to evaluation for details): ADL's; IADL's; Leisure; Social Participation  Pt  will benefit from skilled therapeutic intervention in order to improve on the following performance deficits Body Structure / Function / Physical Skills; Cognitive Skills  Body Structure / Function / Physical Skills ADL; Decreased knowledge of use of DME; Gait; Strength; Balance; UE functional use; [place]; Mobility; IADL; Coordination; Endurance; [place]; Dexterity; Body mechanics; Sensation; Vision  Cognitive Skills Attention; Memory; Orientation; Problem Solve; Safety Awareness; Sequencing; Understand  Rehab Potential Good  Clinical Decision Making Several treatment options, min-mod task modification necessary  Comorbidities Affecting Occupational Performance: May have comorbidities impacting occupational performance  Modification or Assistance to Complete Evaluation  Min-Moderate modification of tasks or assist with assess necessary to complete eval  OT Frequency 2x / week  OT Duration 8 weeks  OT Treatment/Interventions Self-care/ADL training; Therapeutic exercise; Visual/perceptual remediation/compensation; Patient/family education; Neuromuscular education; Aquatic Therapy; Energy conservation; Therapist, nutritional; Therapeutic activities; Balance training; Cognitive remediation/compensation; Passive range of motion; Manual Therapy; DME and/or AE instruction; Moist Heat; Cryotherapy  Plan Review goals w/ pt; initiate HEP for control/coordination  Consulted and Agree with Plan of Care Patient; Family member/caregiver  Family Member Consulted Daughter              Patient will benefit from skilled therapeutic intervention in order to improve the following deficits and impairments:   Body Structure / Function / Physical Skills: ADL, Decreased knowledge of use of DME, Gait, Strength, Balance, UE functional use, FMC, Mobility, IADL, Coordination, Endurance, GMC, Dexterity, Body mechanics, Sensation, Vision Cognitive Skills: Attention, Memory, Orientation, Problem Solve, Safety Awareness,  Sequencing, Understand   Visit Diagnosis: Hemiplegia and hemiparesis following cerebral infarction affecting right dominant side (HCC)  Other lack of coordination  Muscle weakness (generalized)  Frontal lobe and executive function deficit    Problem List 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   Abnormal findings on esophagogastroduodenoscopy (EGD) 10/02/2020   Constipation 10/02/2020   Anemia 10/02/2020   History of seizure disorder 10/02/2020   History of stroke 10/02/2020   History of colon polyps 10/02/2020   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 10/02/2020   Endotracheally intubated 09/16/2020   Community acquired pneumonia 09/16/2020   Cervical spondylosis 09/16/2020   Vertebral artery stenosis 09/16/2020   Status epilepticus ([place]) 09/16/2020   CKD (chronic kidney disease) stage 3, GFR 30-59 ml/min (HCC) 09/16/2020   Occult blood in stools    Leukocytosis    Hyponatremia    History of hypertension    Dysphagia, post-stroke    Dysesthesia    Neuropathic pain    Brainstem infarct, acute (HCC)    Acute blood loss anemia    AKI (acute kidney injury) ([place])  Stroke (cerebrum) ([place]) 06/27/2020   Right leg weakness    Left carotid stenosis 04/13/2014     Kathrine Cords, OTR/L, MSOT  03/06/2021, 5:36 PM  [place]. [place], [place], [address] Phone: [phone]   Fax:  [phone]  Name: ORLIE SCHWINGHAMMER MRN: [id number] Date of Birth: 16-Feb-1946

## 2021-03-06 NOTE — Therapy (Signed)
[place]. [place], [place], [address] Phone: [phone]   Fax:  [phone]  Speech Language Pathology Evaluation  Patient Details  Name: Andres White MRN: [id number] Date of Birth: [birth year] Referring Provider (SLP): Frann Rider NP   Encounter Date: 03/05/2021   End of Session - 03/06/21 1217     Visit Number 1    Number of Visits 17    Date for SLP Re-Evaluation 05/06/21    SLP Start Time 1100    SLP Stop Time  [id number]    SLP Time Calculation (min) 45 min    Activity Tolerance Patient tolerated treatment well             Past Medical History:  Diagnosis Date   Arthritis    Chronic back pain    Duodenal adenoma    Gout    Hypertension    Stroke ([place]) 06/2020   Syncope and collapse     Past Surgical History:  Procedure Laterality Date   BIOPSY  07/13/2020   Procedure: BIOPSY;  Surgeon: Ladene Artist, MD;  Location: [place];  Service: Endoscopy;;   BIOPSY  11/28/2020   Procedure: BIOPSY;  Surgeon: Irving Copas., MD;  Location: [facility];  Service: Gastroenterology;;   CARDIAC CATHETERIZATION  2015   [facility]    COLONOSCOPY WITH PROPOFOL N/A 07/13/2020   Procedure: COLONOSCOPY WITH PROPOFOL;  Surgeon: Ladene Artist, MD;  Location[facility];  Service: Endoscopy;  Laterality: N/A;   COLONOSCOPY WITH PROPOFOL N/A 11/28/2020   Procedure: COLONOSCOPY WITH PROPOFOL;  Surgeon: Rush Landmark Telford Nab., MD;  Location: [facility];  Service: Gastroenterology;  Laterality: N/A;   ENDOSCOPIC MUCOSAL RESECTION N/A 11/28/2020   Procedure: ENDOSCOPIC MUCOSAL RESECTION;  Surgeon: Rush Landmark Telford Nab., MD;  Location: [facility];  Service: Gastroenterology;  Laterality: N/A;   ESOPHAGOGASTRODUODENOSCOPY N/A 07/13/2020   Procedure: ESOPHAGOGASTRODUODENOSCOPY (EGD);  Surgeon: Ladene Artist, MD;  [facility];  Service: Endoscopy;  Laterality: N/A;    ESOPHAGOGASTRODUODENOSCOPY (EGD) WITH PROPOFOL N/A 11/28/2020   Procedure: ESOPHAGOGASTRODUODENOSCOPY (EGD) WITH PROPOFOL;  Surgeon: Rush Landmark Telford Nab., MD;  [facility];  Service: Gastroenterology;  Laterality: N/A;   HEMOSTASIS CLIP PLACEMENT  11/28/2020   Procedure: HEMOSTASIS CLIP PLACEMENT;  Surgeon: Irving Copas., MD;  Location: [facility];  Service: Gastroenterology;;   KNEE SURGERY Right    POLYPECTOMY  07/13/2020   Procedure: POLYPECTOMY;  Surgeon: Ladene Artist, MD;  Location: [place];  Service: Endoscopy;;   POLYPECTOMY  11/28/2020   Procedure: POLYPECTOMY;  Surgeon: Irving Copas., MD;  Location: [facility];  Service: Gastroenterology;;   SUBMUCOSAL LIFTING INJECTION  11/28/2020   Procedure: SUBMUCOSAL LIFTING INJECTION;  Surgeon: Irving Copas., MD;  Location: [facility];  Service: Gastroenterology;;   SUBMUCOSAL TATTOO INJECTION  11/28/2020   Procedure: SUBMUCOSAL TATTOO INJECTION;  Surgeon: Irving Copas., MD;  [facility];  Service: Gastroenterology;;    There were no vitals filed for this visit.   Subjective Assessment - 03/06/21 1206     Subjective pt was pleasant and cooperative throughout today's assessment.    Currently in Pain? No/denies                SLP Evaluation [facility] - 03/06/21 1206       SLP Visit Information   SLP Received On 03/05/21    Referring Provider (SLP) Frann Rider NP    Onset Date 06/24/20    Medical Diagnosis CVA  Subjective   Patient/Family Stated Goal For him to be more independent      General Information   HPI Andres White is a 75 y.o. male with PMH of HTN, arthritis, tobacco use, who [facility] with 4 day history of right leg weakness, L facial weakness, and speech changes. MRI was positive for small acute infarcts in the left midbrain as well as several chronic infarcts in the left frontal and parietal lobes, right  parietotemporal lobe, bilateral thalamus, and bilateral cerebellum. on 09/16/20, admitted for seizure activity.      Balance Screen   Has the patient fallen in the past 6 months Yes    How many times? "A couple of times"      Prior Functional Status   Cognitive/Linguistic Baseline Within functional limits    Type of Home House     Lives With Daughter    Available Support Family    Vocation Retired      Associate Professor   Overall Cognitive Status Impaired/Different from baseline    Area of Impairment Orientation;Memory;Safety/judgement;Awareness;Problem solving;Attention;Following commands    Executive Function Organizing;Sequencing;Decision Making;Reasoning      Reading Comprehension   Reading Status Impaired    Word level 0-25% accurate      Verbal Expression   Overall Verbal Expression Impaired      Written Expression   Written Expression Exceptions to [facility]      Oral Motor/Sensory Function   Overall Oral Motor/Sensory Function Appears within functional limits for tasks assessed    Overall Oral Motor/Sensory Function dentures, top and bottom      Standardized Assessments   Standardized Assessments  Other Assessment    Other Assessment CADL-3                             SLP Education - 03/06/21 1217     Education Details aphasia, cognitive communication    Person(s) Educated Patient;Child(ren)    Methods Explanation;Demonstration;Handout    Comprehension Verbal cues required;Need further instruction;Verbalized understanding              SLP Short Term Goals - 03/06/21 1245       SLP SHORT TERM GOAL #1   Title Pt will answer open-ended questions re: month, year, and caregiver (name + relation) with 75% accuracy given minA.    Time 4    Period Weeks    Status New    Target Date 04/06/21      SLP SHORT TERM GOAL #2   Title Pt and caregiver will use spaced retrieval at home to increase recall of important information.    Time 4    Period Weeks     Status New    Target Date 04/06/21      SLP SHORT TERM GOAL #4   Title Pt will participate in aphasia assessment for further clarification of language impairment.    Time 2    Period Weeks    Status New              SLP Long Term Goals - 03/06/21 1307       SLP LONG TERM GOAL #1   Title Patient will be appropriately oriented to time and situation with minA (or external aid) to improve safety and awareness in functional living environment.    Time 8    Period Weeks    Status New    Target Date 05/06/21  Plan - 03/06/21 1310     Clinical Impression Statement Pt is a 75 yo male who was seen for OP ST evaluation on 03/05/21. PMHx notes hx of CVA and seizures. Daughter reports that she feels he is "getting worse" and is becoming more reliant on her to assist with day-to-day activities. Prior to his CVA in 2021, pt was living independently. Currently, he lives with his daughter and grandson. Pt reported he cannot read or write. Some expressive communication impairment noted during session and throughout evaluation. Required repetition of directions throughout due to suspect difficulty w/ working memory. SLP assessed pt using Communication Activities of Daily Living (CADL-3). Pt scored a 35/100 indicating "low ability to complete functional tasks" which was reflective of his inability to read and write. SLP to assess expressive/receptive language more in depth to include speaking, listening, reading, and writing. Pt daughter reports she wants him to be able to read and write again so that he can become more independent. SLP provided edu on recovery timelines and what to expect from therapy. At this time, SLP and family agreed that making him "as functional and safe as possible" in his environment should be the primary focus of therapy. SLP rec skilled speech services to address communication and memory supports in attempt to reduce caregiver burden and increase patient  independence.    Speech Therapy Frequency 2x / week    Duration 8 weeks    Treatment/Interventions Cueing hierarchy;Functional tasks;Patient/family education;Environmental controls;Cognitive reorganization;Multimodal communcation approach;Language facilitation;Compensatory techniques;Internal/external aids;SLP instruction and feedback    Potential to Achieve Goals [place]    Potential Considerations Previous level of function;Severity of impairments    Consulted and Agree with Plan of Care Patient;Family member/caregiver             Patient will benefit from skilled therapeutic intervention in order to improve the following deficits and impairments:   Aphasia  Cognitive communication deficit    Problem List 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   Abnormal findings on esophagogastroduodenoscopy (EGD) 10/02/2020   Constipation 10/02/2020   Anemia 10/02/2020   History of seizure disorder 10/02/2020   History of stroke 10/02/2020   History of colon polyps 10/02/2020   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 10/02/2020   Endotracheally intubated 09/16/2020   Community acquired pneumonia 09/16/2020   Cervical spondylosis 09/16/2020   Vertebral artery stenosis 09/16/2020   Status epilepticus ([place]) 09/16/2020   CKD (chronic kidney disease) stage 3, GFR 30-59 ml/min (HCC) 09/16/2020   Occult blood in stools    Leukocytosis    Hyponatremia    History of hypertension    Dysphagia, post-stroke    Dysesthesia    Neuropathic pain    Brainstem infarct, acute (HCC)    Acute blood loss anemia    AKI (acute kidney injury) ([place])    Stroke (cerebrum) ([place]) 06/27/2020   Right leg weakness    Left carotid stenosis 04/13/2014    Verdene Lennert MS, [place], CBIS  03/06/2021, 1:23 PM  [place]. [place], [place], [address] Phone: [phone]   Fax:  [phone]  Name:  Andres White MRN: [id number] Date of Birth: 02/19/1946

## 2021-03-07 ENCOUNTER — Telehealth: Payer: Self-pay

## 2021-03-07 NOTE — Telephone Encounter (Signed)
[facility] , calling to verify coordination of care and to notify us that patient Andres White is receiving gabapentin from [place] but also from another provider .  They want to know if we were aware of this . Please advise on what do you want me to do should I call the patient and verify this? [facility] is waiting for [place] to call back to let them know why is he receiving the prescription from both providers . Thank you . He has upcoming appt on 03/15/2021

## 2021-03-11 ENCOUNTER — Ambulatory Visit: Payer: Medicare (Managed Care) | Admitting: Speech Pathology

## 2021-03-11 ENCOUNTER — Ambulatory Visit: Payer: Medicare (Managed Care) | Admitting: Rehabilitative and Restorative Service Providers"

## 2021-03-11 ENCOUNTER — Ambulatory Visit: Payer: Medicare (Managed Care) | Admitting: Occupational Therapy

## 2021-03-13 ENCOUNTER — Encounter: Payer: Medicare (Managed Care) | Attending: Registered Nurse | Admitting: Physical Medicine and Rehabilitation

## 2021-03-13 DIAGNOSIS — D509 Iron deficiency anemia, unspecified: Secondary | ICD-10-CM | POA: Insufficient documentation

## 2021-03-13 DIAGNOSIS — E663 Overweight: Secondary | ICD-10-CM | POA: Insufficient documentation

## 2021-03-13 DIAGNOSIS — I10 Essential (primary) hypertension: Secondary | ICD-10-CM | POA: Insufficient documentation

## 2021-03-13 DIAGNOSIS — M792 Neuralgia and neuritis, unspecified: Secondary | ICD-10-CM | POA: Insufficient documentation

## 2021-03-13 NOTE — Telephone Encounter (Signed)
Okay thank you how do we stop prescribing or do I call the patient and let him now we are going to stop .

## 2021-03-15 ENCOUNTER — Ambulatory Visit: Payer: Medicare (Managed Care) | Admitting: Physical Medicine and Rehabilitation

## 2021-03-20 ENCOUNTER — Ambulatory Visit: Payer: Medicare (Managed Care)

## 2021-03-20 ENCOUNTER — Encounter: Payer: Medicare (Managed Care) | Admitting: Speech Pathology

## 2021-03-20 ENCOUNTER — Ambulatory Visit: Payer: Medicare (Managed Care) | Admitting: Occupational Therapy

## 2021-03-20 ENCOUNTER — Telehealth: Payer: Self-pay

## 2021-03-20 NOTE — Telephone Encounter (Signed)
Jeanett Schlein From [facility] called to inform us that pt was receiving  gabapentin from PCP and Dr.Raulkar . Jeanett Schlein received a call back from [place] informing her we were  not aware pt was receiving it from PCP . Dr.Raulker advised for [place] to stop prescribing and let pt pcp prescribe it , Jeanett Schlein called back to ask when the last visit was and what was the diagnosis code for gabapentin .

## 2021-03-25 ENCOUNTER — Other Ambulatory Visit: Payer: Self-pay | Admitting: Cardiovascular Disease

## 2021-03-25 ENCOUNTER — Encounter: Payer: Medicare (Managed Care) | Admitting: Physical Therapy

## 2021-03-25 ENCOUNTER — Encounter: Payer: Medicare (Managed Care) | Admitting: Speech Pathology

## 2021-03-25 ENCOUNTER — Encounter: Payer: Medicare (Managed Care) | Admitting: Occupational Therapy

## 2021-03-27 ENCOUNTER — Encounter: Payer: Medicare (Managed Care) | Admitting: Occupational Therapy

## 2021-03-27 ENCOUNTER — Encounter: Payer: Medicare (Managed Care) | Admitting: Speech Pathology

## 2021-03-27 ENCOUNTER — Encounter: Payer: Medicare (Managed Care) | Admitting: Rehabilitative and Restorative Service Providers"

## 2021-03-29 DIAGNOSIS — I129 Hypertensive chronic kidney disease with stage 1 through stage 4 chronic kidney disease, or unspecified chronic kidney disease: Secondary | ICD-10-CM

## 2021-03-29 DIAGNOSIS — F0281 Dementia in other diseases classified elsewhere with behavioral disturbance: Secondary | ICD-10-CM

## 2021-03-29 DIAGNOSIS — F05 Delirium due to known physiological condition: Secondary | ICD-10-CM

## 2021-03-29 DIAGNOSIS — M47812 Spondylosis without myelopathy or radiculopathy, cervical region: Secondary | ICD-10-CM

## 2021-03-29 DIAGNOSIS — N183 Chronic kidney disease, stage 3 unspecified: Secondary | ICD-10-CM

## 2021-03-29 DIAGNOSIS — I6503 Occlusion and stenosis of bilateral vertebral arteries: Secondary | ICD-10-CM

## 2021-03-29 DIAGNOSIS — G40901 Epilepsy, unspecified, not intractable, with status epilepticus: Secondary | ICD-10-CM

## 2021-03-29 DIAGNOSIS — I6523 Occlusion and stenosis of bilateral carotid arteries: Secondary | ICD-10-CM

## 2021-03-29 DIAGNOSIS — M109 Gout, unspecified: Secondary | ICD-10-CM

## 2021-03-29 DIAGNOSIS — J189 Pneumonia, unspecified organism: Secondary | ICD-10-CM

## 2021-04-01 ENCOUNTER — Encounter: Payer: Medicare (Managed Care) | Admitting: Occupational Therapy

## 2021-04-01 ENCOUNTER — Encounter: Payer: Medicare (Managed Care) | Admitting: Physical Therapy

## 2021-04-01 ENCOUNTER — Encounter: Payer: Medicare (Managed Care) | Admitting: Speech Pathology

## 2021-04-03 ENCOUNTER — Encounter: Payer: Medicare (Managed Care) | Admitting: Occupational Therapy

## 2021-04-03 ENCOUNTER — Encounter: Payer: Medicare (Managed Care) | Admitting: Rehabilitative and Restorative Service Providers"

## 2021-04-03 ENCOUNTER — Encounter: Payer: Medicare (Managed Care) | Admitting: Speech Pathology

## 2021-04-11 ENCOUNTER — Other Ambulatory Visit: Payer: Self-pay | Admitting: Cardiovascular Disease

## 2021-05-06 ENCOUNTER — Other Ambulatory Visit: Payer: Self-pay | Admitting: Cardiovascular Disease

## 2021-05-07 ENCOUNTER — Telehealth: Payer: Self-pay | Admitting: Cardiovascular Disease

## 2021-05-07 NOTE — Telephone Encounter (Signed)
Attempted to contact pharmacy. Unable to reach anyone.  Spoke with pt's daughter. She report pt's DOB in epic and license is not correct and could be the reason the pharmacy is having and issue filing medication. Daughter state DOB on license hasn't been correct for a long time and not sure how it happened. However his medicare card is correct and pt SOB is November 15, 1945.  Daughter advised to contact pharmacy and insurance for medication processing. Daughter also advised to have pt update license then our office could correct account information. Daughter verbalized understanding.

## 2021-05-07 NOTE — Telephone Encounter (Signed)
Pt c/o medication issue:  1. Name of Medication:  colchicine 0.6 MG tablet atorvastatin (LIPITOR) 40 MG tablet  2. How are you currently taking this medication (dosage and times per day)?   3. Are you having a reaction (difficulty breathing--STAT)?   4. What is your medication issue?   Crystal with [facility] states there is an issue with the patient's Rx. She states they received it, but they aren't able to distribute it because it says "renewal request denied" on the top. She assumes this is because the DOB we have on file for the patient is different than the one they have. She states the DOB they have is [birth year] and that is the correct DOB.

## 2021-05-08 ENCOUNTER — Other Ambulatory Visit (HOSPITAL_BASED_OUTPATIENT_CLINIC_OR_DEPARTMENT_OTHER): Payer: Self-pay

## 2021-05-08 MED ORDER — INFLUENZA VAC A&B SA ADJ QUAD 0.5 ML IM PRSY
PREFILLED_SYRINGE | INTRAMUSCULAR | 0 refills | Status: DC
  Filled 2021-05-08: qty 0.5, 1d supply, fill #0

## 2021-05-09 ENCOUNTER — Other Ambulatory Visit: Payer: Self-pay | Admitting: Cardiovascular Disease

## 2021-05-09 ENCOUNTER — Other Ambulatory Visit: Payer: Self-pay

## 2021-05-09 ENCOUNTER — Telehealth: Payer: Self-pay | Admitting: Physical Medicine and Rehabilitation

## 2021-05-09 MED ORDER — ATORVASTATIN CALCIUM 40 MG PO TABS: 40.0000 mg | ORAL_TABLET | Freq: Every day | ORAL | 1 refills | Status: DC

## 2021-05-09 NOTE — Telephone Encounter (Signed)
Called pharmacy- gave verbal for medication.  No questions/concerns.

## 2021-05-09 NOTE — Telephone Encounter (Signed)
Andres White calling back. She states they can do a verbal order for the medication or it can be faxed to them again without the denial on it. She says he is out of refills and needs this medication. Phone: [phone]

## 2021-05-09 NOTE — Telephone Encounter (Signed)
Crystal with [facility] needs a call back, she has questions about patients medication.  Her number is [phone].

## 2021-05-09 NOTE — Telephone Encounter (Signed)
Called Andres White back and she was looking for Atorvastatin and Colchicine. I told who and the number to who refilled those meds.

## 2021-05-12 ENCOUNTER — Other Ambulatory Visit: Payer: Self-pay | Admitting: Neurology

## 2021-05-14 ENCOUNTER — Other Ambulatory Visit: Payer: Self-pay | Admitting: Physical Medicine and Rehabilitation

## 2021-07-02 ENCOUNTER — Other Ambulatory Visit: Payer: Self-pay | Admitting: General Practice

## 2021-07-02 ENCOUNTER — Ambulatory Visit: Payer: Medicare (Managed Care) | Admitting: Neurology

## 2021-07-02 ENCOUNTER — Encounter: Payer: Self-pay | Admitting: Neurology

## 2021-07-08 ENCOUNTER — Other Ambulatory Visit: Payer: Self-pay | Admitting: General Practice

## 2021-07-09 ENCOUNTER — Telehealth: Payer: Self-pay | Admitting: Cardiovascular Disease

## 2021-07-09 NOTE — Telephone Encounter (Signed)
PCP need to be contacted in order to have this medication refilled.

## 2021-07-09 NOTE — Telephone Encounter (Signed)
*  STAT* If patient is at the pharmacy, call can be transferred to refill team.   1. Which medications need to be refilled? (please list name of each medication and dose if known) citalopram (CELEXA) 20 MG tablet  2. Which pharmacy/location (including street and city if local pharmacy) is medication to be sent to? [place] they need a 30 day or 90 day supply? 30 day

## 2021-08-01 ENCOUNTER — Encounter: Payer: Self-pay | Admitting: Cardiovascular Disease

## 2021-08-01 ENCOUNTER — Ambulatory Visit (INDEPENDENT_AMBULATORY_CARE_PROVIDER_SITE_OTHER): Payer: Medicare (Managed Care) | Admitting: Cardiovascular Disease

## 2021-08-01 ENCOUNTER — Other Ambulatory Visit: Payer: Self-pay

## 2021-08-01 VITALS — BP 118/73 | HR 75 | Ht 67.0 in | Wt 198.6 lb

## 2021-08-01 DIAGNOSIS — I6522 Occlusion and stenosis of left carotid artery: Secondary | ICD-10-CM

## 2021-08-01 DIAGNOSIS — E782 Mixed hyperlipidemia: Secondary | ICD-10-CM

## 2021-08-01 DIAGNOSIS — I10 Essential (primary) hypertension: Secondary | ICD-10-CM

## 2021-08-01 DIAGNOSIS — I472 Ventricular tachycardia, unspecified: Secondary | ICD-10-CM

## 2021-08-01 DIAGNOSIS — I739 Peripheral vascular disease, unspecified: Secondary | ICD-10-CM

## 2021-08-01 DIAGNOSIS — R001 Bradycardia, unspecified: Secondary | ICD-10-CM

## 2021-08-01 DIAGNOSIS — I6381 Other cerebral infarction due to occlusion or stenosis of small artery: Secondary | ICD-10-CM

## 2021-08-01 NOTE — Patient Instructions (Signed)
Medication Instructions:  The current medical regimen is effective;  continue present plan and medications.  *If you need a refill on your cardiac medications before your next appointment, please call your pharmacy*   Follow-Up: At [facility], you and your health needs are our priority.  As part of our continuing mission to provide you with exceptional heart care, we have created designated Provider Care Teams.  These Care Teams include your primary Cardiologist (physician) and Advanced Practice Providers (APPs -  Physician Assistants and Nurse Practitioners) who all work together to provide you with the care you need, when you need it.  We recommend signing up for the patient portal called "MyChart".  Sign up information is provided on this After Visit Summary.  MyChart is used to connect with patients for Virtual Visits (Telemedicine).  Patients are able to view lab/test results, encounter notes, upcoming appointments, etc.  Non-urgent messages can be sent to your provider as well.   To learn more about what you can do with MyChart, go to [email or web address].    Your next appointment:   12 month(s)  The format for your next appointment:   In Person  Provider:   Sande Rives, PA-C or Almyra Deforest, PA-C or Eleonore Chiquito, MD

## 2021-08-01 NOTE — Progress Notes (Signed)
Cardiology Office Note:   Date:  08/01/2021  NAME:  Andres White    MRN: [id number] DOB:  Apr 04, 1946   PCP:  Marrian Salvage, [place]  Cardiologist:  Evalina Field, MD  Electrophysiologist:  None   Referring MD: Marrian Salvage,*   Chief Complaint  Patient presents with   Follow-up         History of Present Illness:   Andres White is a 75 y.o. male with a hx of CVA, VT, carotid artery stenosis, HLD, tobacco abuse who presents for follow-up.  He presents with his daughter.  Overall doing quite well.  BP 118/73.  He denies any chest pain or trouble breathing.  He still has some trace edema in his legs with this is much improved by compression stockings.  No bleeding on aspirin.  EKG shows sinus rhythm with no acute ischemic changes or evidence of infarction.  He describes no palpitations.  His beta-blocker was stopped due to low heart rate.  No rapid heartbeat sensation.  Echocardiogram and stress test were normal.  No strokelike symptoms or seizures reported.  LDL cholesterol at goal earlier this year.  Overall doing quite well.  Problem List 1. HTN 2. 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 Tobacco abuse 4. HLD -T chol 79, HDL 44, LDL 19, triglycerides 81 -A1c 4.5 5. Carotid Artery Disease -L ICA 70% (CTA 09/16/2020) -R ICA 20% (CTA 09/16/2020) 6. Ventricular tachycardia vs SVT with aberrancy  -captured on monitor; no symptoms; monomorphic, 39 second duration  -EF 55-60% -normal MPI 08/08/2020 7. 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  Dementia 9. PAD -R ABI 0.74 -L TBI 0.86  Past Medical History: Past Medical History:  Diagnosis Date   Arthritis    Chronic back pain    Duodenal adenoma    Gout    Hypertension    Stroke ([place]) 06/2020   Syncope and collapse     Past Surgical History: Past Surgical History:  Procedure Laterality Date   BIOPSY  07/13/2020   Procedure: BIOPSY;  Surgeon: Ladene Artist, MD;  Location: [place];  Service:  Endoscopy;;   BIOPSY  11/28/2020   Procedure: BIOPSY;  Surgeon: Irving Copas., MD;  Location: [facility];  Service: Gastroenterology;;   CARDIAC CATHETERIZATION  2015   [facility]    COLONOSCOPY WITH PROPOFOL N/A 07/13/2020   Procedure: COLONOSCOPY WITH PROPOFOL;  Surgeon: Ladene Artist, MD;  [facility];  Service: Endoscopy;  Laterality: N/A;   COLONOSCOPY WITH PROPOFOL N/A 11/28/2020   Procedure: COLONOSCOPY WITH PROPOFOL;  Surgeon: Rush Landmark Telford Nab., MD;  Location: [facility];  Service: Gastroenterology;  Laterality: N/A;   ENDOSCOPIC MUCOSAL RESECTION N/A 11/28/2020   Procedure: ENDOSCOPIC MUCOSAL RESECTION;  Surgeon: Rush Landmark Telford Nab., MD;  Location: [facility];  Service: Gastroenterology;  Laterality: N/A;   ESOPHAGOGASTRODUODENOSCOPY N/A 07/13/2020   Procedure: ESOPHAGOGASTRODUODENOSCOPY (EGD);  Surgeon: Ladene Artist, MD;  Location[facility] END[facility]COPY;  Service: Endoscopy;  Laterality: N/A;   ESOPHAGOGASTRODUODENOSCOPY (EGD) WITH PROPOFOL N/A 11/28/2020   Procedure: ESOPHAGOGASTRODUODENOSCOPY (EGD) WITH PROPOFOL;  Surgeon: Rush Landmark Telford Nab., MD;  Location: [facility];  Service: Gastroenterology;  Laterality: N/A;   HEMOSTASIS CLIP PLACEMENT  11/28/2020   Procedure: HEMOSTASIS CLIP PLACEMENT;  Surgeon: Irving Copas., MD;  Location: [facility];  Service: Gastroenterology;;   KNEE SURGERY Right    POLYPECTOMY  07/13/2020   Procedure: POLYPECTOMY;  Surgeon: Ladene Artist, MD;  Location[facility] END[facility]COPY;  Service: Endoscopy;;   POLYPECTOMY  11/28/2020   Procedure: POLYPECTOMY;  Surgeon: Rush Landmark Telford Nab., MD;  Location: [facility];  Service: Gastroenterology;;   Maryagnes Amos INJECTION  11/28/2020   Procedure: SUBMUCOSAL LIFTING INJECTION;  Surgeon: Irving Copas., MD;  Location: [facility];  Service: Gastroenterology;;   SUBMUCOSAL TATTOO INJECTION  11/28/2020   Procedure: SUBMUCOSAL TATTOO INJECTION;  Surgeon:  Irving Copas., MD;  Location: [facility];  Service: Gastroenterology;;    Current Medications: Current Meds  Medication Sig   acetaminophen (TYLENOL) 325 MG tablet Take 1-2 tablets (325-650 mg total) by mouth every 4 (four) hours as needed for mild pain.   allopurinol (ZYLOPRIM) 100 MG tablet TAKE 1 TABLET(100 MG) BY MOUTH DAILY   amLODipine (NORVASC) 10 MG tablet Take 1 tablet (10 mg total) by mouth daily.   aspirin EC 81 MG tablet Take 1 tablet (81 mg total) by mouth daily. (Patient taking differently: Take 81 mg by mouth in the morning.)   atorvastatin (LIPITOR) 40 MG tablet Take 1 tablet (40 mg total) by mouth daily.   citalopram (CELEXA) 20 MG tablet TAKE 1 TABLET BY MOUTH ONCE DAILY   colchicine 0.6 MG tablet TAKE 1 TABLET BY MOUTH ONCE DAILY   diclofenac Sodium (VOLTAREN) 1 % GEL Apply 2 g topically 4 (four) times daily as needed (pain).   Ferrous Sulfate (IRON) 325 (65 Fe) MG TABS Take 1 tablet (325 mg total) by mouth 2 (two) times daily before a meal. (Patient taking differently: Take 325 mg by mouth daily.)   furosemide (LASIX) 20 MG tablet Take 1 tablet (20 mg total) by mouth in the morning.   gabapentin (NEURONTIN) 300 MG capsule TAKE 1 CAPSULE BY MOUTH THREE TIMES DAILY   hydrocerin (EUCERIN) CREA Apply 1 application topically 2 (two) times daily. (Patient taking differently: Apply 1 application topically daily as needed.)   levETIRAcetam (KEPPRA) 500 MG tablet Take 1 tablet (500 mg total) by mouth 2 (two) times daily.   lisinopril-hydrochlorothiazide (ZESTORETIC) 10-12.5 MG tablet Take 1 tablet by mouth daily.   Multiple Vitamin (MULTIVITAMIN WITH MINERALS) TABS tablet Take 1 tablet by mouth daily. (Patient taking differently: Take 1 tablet by mouth daily at 12 noon.)   pantoprazole (PROTONIX) 40 MG tablet TAKE 1 TABLET(40 MG) BY MOUTH TWICE DAILY (Patient taking differently: Take 40 mg by mouth 2 (two) times daily.)   phenytoin (DILANTIN) 100 MG ER capsule Take 1  capsule (100 mg total) by mouth 2 (two) times daily for 30 days, THEN 1 capsule (100 mg total) at bedtime. (Patient taking differently: Take 1 capsule THREE TIMES A DAY)   QUEtiapine (SEROQUEL) 50 MG tablet TAKE 1 TABLET BY MOUTH AT BEDTIME   tamsulosin (FLOMAX) 0.4 MG CAPS capsule Take 2 capsules (0.8 mg total) by mouth daily after supper. (Patient taking differently: Take 0.4 mg by mouth daily after supper.)     Allergies:    Patient has no known allergies.   Social History: Social History   Socioeconomic History   Marital status: Single    Spouse name: Not on file   Number of children: Not on file   Years of education: Not on file   Highest education level: Not on file  Occupational History   Not on file  Tobacco Use   Smoking status: Every Day    Packs/day: 1.00    Years: 20.00    Pack years: 20.00    Types: Cigarettes   Smokeless tobacco: Never  Vaping Use   Vaping Use: Never used  Substance  and Sexual Activity   Alcohol use: Not Currently    Comment: occasional   Drug use: No   Sexual activity: Not on file  Other Topics Concern   Not on file  Social History Narrative   Lives with daughter, son in law and 2 grandchildren   Right Handed   Drinks 1 cup caffeine 4 times a week-ish   Social Determinants of Health   Financial Resource Strain: Not on file  Food Insecurity: Not on file  Transportation Needs: Not on file  Physical Activity: Not on file  Stress: Not on file  Social Connections: Not on file     Family History: The patient's family history includes Hypertension in his mother. There is no history of Colon cancer, Pancreatic cancer, Stomach cancer, Esophageal cancer, Inflammatory bowel disease, Liver disease, or Rectal cancer.  ROS:   All other ROS reviewed and negative. Pertinent positives noted in the HPI.     EKGs/Labs/Other Studies Reviewed:   The following studies were personally reviewed by me today:  EKG:  EKG is ordered today.  The ekg  ordered today demonstrates normal sinus rhythm heart rate 75, old anterior infarct, inferior infarct, and was personally reviewed by me.   TTE 06/25/2020  1. Left ventricular ejection fraction, by estimation, is 60 to 65%. The  left ventricle has normal function. The left ventricle has no regional  wall motion abnormalities. There is severe concentric left ventricular  hypertrophy. Left ventricular diastolic   parameters are consistent with Grade I diastolic dysfunction (impaired  relaxation).   2. Right ventricular systolic function is normal. The right ventricular  size is normal.   3. The mitral valve is normal in structure. Mild mitral valve  regurgitation. No evidence of mitral stenosis.   4. The aortic valve is normal in structure. Aortic valve regurgitation is  not visualized. No aortic stenosis is present.   5. The inferior vena cava is normal in size with greater than 50%  respiratory variability, suggesting right atrial pressure of 3 mmHg.   NM Stress 08/08/2020 Nuclear stress EF: 59%. The left ventricular ejection fraction is normal (55-65%). The study is normal. This is a low risk study.   Normal resting and stress perfusion. No ischemia or infarction EF 59%   Recent Labs: 08/30/2020: TSH 3.110 09/19/2020: ALT 14 09/20/2020: Magnesium 1.7 10/19/2020: BUN 15; Creatinine, Ser 1.18; Hemoglobin 10.6; Platelets 237.0; Potassium 3.7; Sodium 140   Recent Lipid Panel    Component Value Date/Time   CHOL 79 09/18/2020 0616   TRIG 81 09/18/2020 0616   HDL 44 09/18/2020 0616   CHOLHDL 1.8 09/18/2020 0616   VLDL 16 09/18/2020 0616   LDLCALC 19 09/18/2020 0616    Physical Exam:   VS:  BP 118/73    Pulse 75    Ht 5\' 7"  (1.702 m)    Wt 198 lb 9.6 oz (90.1 kg)    SpO2 97%    BMI 31.11 kg/m    Wt Readings from Last 3 Encounters:  08/01/21 198 lb 9.6 oz (90.1 kg)  02/25/21 182 lb (82.6 kg)  01/09/21 180 lb 9.6 oz (81.9 kg)    General: Well nourished, well developed, in no  acute distress Head: Atraumatic, normal size  Eyes: PEERLA, EOMI  Neck: Supple, no JVD Endocrine: No thryomegaly Cardiac: Normal S1, S2; RRR; no murmurs, rubs, or gallops Lungs: Clear to auscultation bilaterally, no wheezing, rhonchi or rales  Abd: Soft, nontender, no hepatomegaly  Ext: No edema, pulses 2+ Musculoskeletal:  No deformities, BUE and BLE strength normal and equal Skin: Warm and dry, no rashes   Neuro: Alert and oriented to person, place, time, and situation, CNII-XII grossly intact, no focal deficits  Psych: Normal mood and affect   ASSESSMENT:   Andres White is a 75 y.o. male who presents for the following: 1. Bradycardia   2. Primary hypertension   3. Ventricular tachycardia   4. Mixed hyperlipidemia   5. Lacunar infarction ([place])   6. Left carotid stenosis   7. PAD (peripheral artery disease) ([place])     PLAN:   1. Bradycardia -Beta-blocker was stopped due to bradycardia.  No symptoms further.  Okay to forego this.  2. Primary hypertension -Well-controlled today in office.  No change in medications.  3. Ventricular tachycardia -History of 1 episode of sustained ventricular tachycardia versus SVT with aberrant conduction.  Echocardiogram showed normal LV function.  Stress test with no evidence of ischemia.  No further episodes reported.  No palpitations.  His beta-blocker was stopped due to bradycardia.  I think this is okay for now.  He has no symptoms so no need to continue beta-blocker therapy.  4. Mixed hyperlipidemia -History of stroke.  On statin therapy.  Most recent LDL 19.  This is at goal.  5. Lacunar infarction ([place]) -History of lacunar infarct and stroke disorders.  Followed by neurology.  This is small vessel disease.  6. Left carotid stenosis -70% left ICA stenosis on recent CT scan.  No bruit on exam.  Followed by vascular surgery.  7. PAD (peripheral artery disease) ([facility]) -Abnormal ABIs.  No symptoms of PAD.  Followed by vascular surgery.   Continue aspirin and statin therapy.  Disposition: Return in about 1 year (around 08/01/2022).  Medication Adjustments/Labs and Tests Ordered: Current medicines are reviewed at length with the patient today.  Concerns regarding medicines are outlined above.  Orders Placed This Encounter  Procedures   EKG 12-Lead   No orders of the defined types were placed in this encounter.   Patient Instructions  Medication Instructions:  The current medical regimen is effective;  continue present plan and medications.  *If you need a refill on your cardiac medications before your next appointment, please call your pharmacy*   Follow-Up: At [facility], you and your health needs are our priority.  As part of our continuing mission to provide you with exceptional heart care, we have created designated Provider Care Teams.  These Care Teams include your primary Cardiologist (physician) and Advanced Practice Providers (APPs -  Physician Assistants and Nurse Practitioners) who all work together to provide you with the care you need, when you need it.  We recommend signing up for the patient portal called "MyChart".  Sign up information is provided on this After Visit Summary.  MyChart is used to connect with patients for Virtual Visits (Telemedicine).  Patients are able to view lab/test results, encounter notes, upcoming appointments, etc.  Non-urgent messages can be sent to your provider as well.   To learn more about what you can do with MyChart, go to [email or web address].    Your next appointment:   12 month(s)  The format for your next appointment:   In Person  Provider:   Sande Rives, PA-C or Almyra Deforest, PA-C or Eleonore Chiquito, MD     Time Spent with Patient: I have spent a total of 25 minutes with patient reviewing hospital notes, telemetry, EKGs, labs and examining the patient as well as establishing an assessment and  plan that was discussed with the patient.  > 50% of time was spent  in direct patient care.  Signed, Addison Naegeli. Audie Box, MD, [place]  [address], [place], [place] [address] [phone]  08/01/2021 10:22 AM

## 2021-08-08 ENCOUNTER — Other Ambulatory Visit: Payer: Self-pay | Admitting: General Practice

## 2021-08-08 NOTE — Telephone Encounter (Signed)
Citalopram Hyrobromide (Celexa) is considered an antidepressant of the selective serotonin reuptake inhibitor class.

## 2021-08-13 ENCOUNTER — Other Ambulatory Visit: Payer: Self-pay | Admitting: General Practice

## 2021-08-30 ENCOUNTER — Other Ambulatory Visit: Payer: Self-pay | Admitting: General Practice

## 2021-10-11 ENCOUNTER — Other Ambulatory Visit: Payer: Self-pay | Admitting: Family

## 2021-10-11 ENCOUNTER — Telehealth: Payer: Self-pay | Admitting: Cardiovascular Disease

## 2021-10-11 MED ORDER — TAMSULOSIN HCL 0.4 MG PO CAPS: 0.4000 mg | ORAL_CAPSULE | Freq: Every day | ORAL | 1 refills | Status: DC

## 2021-10-11 MED ORDER — PANTOPRAZOLE SODIUM 40 MG PO TBEC: 40.0000 mg | DELAYED_RELEASE_TABLET | Freq: Two times a day (BID) | ORAL | 1 refills | Status: DC

## 2021-10-11 MED ORDER — CITALOPRAM HYDROBROMIDE 20 MG PO TABS: 20.0000 mg | ORAL_TABLET | Freq: Every day | ORAL | 1 refills | Status: DC

## 2021-10-11 NOTE — Telephone Encounter (Signed)
?*  STAT* If patient is at the pharmacy, call can be transferred to refill team. ? ? ?1. Which medications need to be refilled? (please list name of each medication and dose if known) citalopram (CELEXA) 20 MG tablet ? ?pantoprazole (PROTONIX) 40 MG tablet ? ?tamsulosin (FLOMAX) 0.4 MG CAPS capsule ? ?2. Which pharmacy/location (including street and city if local pharmacy) is medication to be sent to? [place], [place]. ? ?3. Do they need a 30 day or 90 day supply? 90 ? ?Patient is out of medication  ? ?

## 2021-10-15 ENCOUNTER — Other Ambulatory Visit: Payer: Self-pay | Admitting: Family Medicine

## 2021-11-29 ENCOUNTER — Other Ambulatory Visit: Payer: Self-pay | Admitting: Cardiovascular Disease

## 2021-12-02 ENCOUNTER — Ambulatory Visit: Payer: Medicare (Managed Care) | Admitting: Physician Assistant

## 2021-12-05 ENCOUNTER — Other Ambulatory Visit: Payer: Self-pay | Admitting: Family

## 2021-12-05 DIAGNOSIS — G40901 Epilepsy, unspecified, not intractable, with status epilepticus: Secondary | ICD-10-CM

## 2021-12-05 DIAGNOSIS — I69398 Other sequelae of cerebral infarction: Secondary | ICD-10-CM

## 2021-12-06 ENCOUNTER — Telehealth: Payer: Self-pay | Admitting: Family

## 2021-12-06 NOTE — Telephone Encounter (Signed)
I have called and left a detailed message stating that the sz medications needs to be filled by Neurology and also he does have an appointment coming up next week.  ?

## 2021-12-06 NOTE — Telephone Encounter (Signed)
Please make sure the daughter understands that his Dilantin and Keppra are managed by neurology and she needs to reach out to them regarding refills.  ?He was last seen by neurology in 7/22 and they were needing to see him in November 2022;  ? ?I have not seen him since 11/2020 and would need yearly follow up here as well.  ?

## 2021-12-10 ENCOUNTER — Ambulatory Visit (INDEPENDENT_AMBULATORY_CARE_PROVIDER_SITE_OTHER): Payer: Medicare Other | Admitting: *Deleted

## 2021-12-10 ENCOUNTER — Encounter: Payer: Self-pay | Admitting: Family

## 2021-12-10 ENCOUNTER — Ambulatory Visit (INDEPENDENT_AMBULATORY_CARE_PROVIDER_SITE_OTHER): Payer: Medicare Other | Admitting: Family

## 2021-12-10 ENCOUNTER — Telehealth: Payer: Self-pay | Admitting: *Deleted

## 2021-12-10 VITALS — BP 120/72 | HR 59 | Temp 97.9°F | Resp 18 | Ht 67.0 in | Wt 198.8 lb

## 2021-12-10 DIAGNOSIS — Z720 Tobacco use: Secondary | ICD-10-CM

## 2021-12-10 DIAGNOSIS — R29898 Other symptoms and signs involving the musculoskeletal system: Secondary | ICD-10-CM

## 2021-12-10 DIAGNOSIS — Z0000 Encounter for general adult medical examination without abnormal findings: Secondary | ICD-10-CM | POA: Diagnosis not present

## 2021-12-10 DIAGNOSIS — I636 Cerebral infarction due to cerebral venous thrombosis, nonpyogenic: Secondary | ICD-10-CM

## 2021-12-10 DIAGNOSIS — I6389 Other cerebral infarction: Secondary | ICD-10-CM

## 2021-12-10 DIAGNOSIS — Z125 Encounter for screening for malignant neoplasm of prostate: Secondary | ICD-10-CM

## 2021-12-10 DIAGNOSIS — R296 Repeated falls: Secondary | ICD-10-CM

## 2021-12-10 DIAGNOSIS — E538 Deficiency of other specified B group vitamins: Secondary | ICD-10-CM

## 2021-12-10 DIAGNOSIS — E785 Hyperlipidemia, unspecified: Secondary | ICD-10-CM | POA: Diagnosis not present

## 2021-12-10 DIAGNOSIS — D509 Iron deficiency anemia, unspecified: Secondary | ICD-10-CM | POA: Diagnosis not present

## 2021-12-10 DIAGNOSIS — I6503 Occlusion and stenosis of bilateral vertebral arteries: Secondary | ICD-10-CM

## 2021-12-10 DIAGNOSIS — N179 Acute kidney failure, unspecified: Secondary | ICD-10-CM

## 2021-12-10 LAB — CBC WITH DIFFERENTIAL/PLATELET
Basophils Absolute: 0 10*3/uL (ref 0.0–0.1)
Basophils Relative: 0.3 % (ref 0.0–3.0)
Eosinophils Absolute: 0.1 10*3/uL (ref 0.0–0.7)
Eosinophils Relative: 1.5 % (ref 0.0–5.0)
HCT: 32.9 % — ABNORMAL LOW (ref 39.0–52.0)
Hemoglobin: 10.6 g/dL — ABNORMAL LOW (ref 13.0–17.0)
Lymphocytes Relative: 38.1 % (ref 12.0–46.0)
Lymphs Abs: 3.1 10*3/uL (ref 0.7–4.0)
MCHC: 32.4 g/dL (ref 30.0–36.0)
MCV: 92.2 fl (ref 78.0–100.0)
Monocytes Absolute: 0.6 10*3/uL (ref 0.1–1.0)
Monocytes Relative: 7.1 % (ref 3.0–12.0)
Neutro Abs: 4.2 10*3/uL (ref 1.4–7.7)
Neutrophils Relative %: 53 % (ref 43.0–77.0)
Platelets: 235 10*3/uL (ref 150.0–400.0)
RBC: 3.56 Mil/uL — ABNORMAL LOW (ref 4.22–5.81)
RDW: 15.3 % (ref 11.5–15.5)
WBC: 8 10*3/uL (ref 4.0–10.5)

## 2021-12-10 LAB — LIPID PANEL
Cholesterol: 104 mg/dL (ref 0–200)
HDL: 50.5 mg/dL (ref >39.00)
LDL Cholesterol: 35 mg/dL (ref 0–99)
NonHDL: 53.72
Total CHOL/HDL Ratio: 2
Triglycerides: 93 mg/dL (ref 0.0–149.0)
VLDL: 18.6 mg/dL (ref 0.0–40.0)

## 2021-12-10 LAB — COMPREHENSIVE METABOLIC PANEL
ALT: 13 U/L (ref 0–53)
AST: 17 U/L (ref 0–37)
Albumin: 4 g/dL (ref 3.5–5.2)
Alkaline Phosphatase: 128 U/L — ABNORMAL HIGH (ref 39–117)
BUN: 24 mg/dL — ABNORMAL HIGH (ref 6–23)
CO2: 22 mEq/L (ref 19–32)
Calcium: 9.2 mg/dL (ref 8.4–10.5)
Chloride: 101 mEq/L (ref 96–112)
Creatinine, Ser: 1.46 mg/dL (ref 0.40–1.50)
GFR: 45.85 mL/min — ABNORMAL LOW (ref >60.00)
Glucose, Bld: 79 mg/dL (ref 70–99)
Potassium: 4 mEq/L (ref 3.5–5.1)
Sodium: 135 mEq/L (ref 135–145)
Total Bilirubin: 0.4 mg/dL (ref 0.2–1.2)
Total Protein: 8.1 g/dL (ref 6.0–8.3)

## 2021-12-10 LAB — VITAMIN B12: Vitamin B-12: 176 pg/mL — ABNORMAL LOW (ref 211–911)

## 2021-12-10 LAB — PSA: PSA: 17.23 ng/mL — ABNORMAL HIGH (ref 0.10–4.00)

## 2021-12-10 MED ORDER — HYDROCERIN EX CREA: 1.0000 "application " | TOPICAL_CREAM | Freq: Two times a day (BID) | CUTANEOUS | 1 refills | Status: DC

## 2021-12-10 NOTE — Chronic Care Management (AMB) (Signed)
?  Chronic Care Management  ? ?Note ? ?12/10/2021 ?Name: Andres White MRN: [id number] DOB: 1946-03-11 ? ?Andres White is a 76 y.o. year old male who is a primary care patient of Marrian Salvage, [place]. I reached out to Andres White by phone today in response to a referral sent by Andres White PCP. ? ?Andres White was given information about Chronic Care Management services today including:  ?CCM service includes personalized support from designated clinical staff supervised by his physician, including individualized plan of care and coordination with other care providers ?24/7 contact phone numbers for assistance for urgent and routine care needs. ?Service will only be billed when office clinical staff spend 20 minutes or more in a month to coordinate care. ?Only one practitioner may furnish and bill the service in a calendar month. ?The patient may stop CCM services at any time (effective at the end of the month) by phone call to the office staff. ?The patient is responsible for co-pay (up to 20% after annual deductible is met) if co-pay is required by the individual health plan.  ? ?Patient agreed to services and verbal consent obtained.  ? ?Follow up plan: ?Telephone appointment with care management team member scheduled for: 12/10/2021 ? ?Andres White, CCMA ?Care Guide, Embedded Care Coordination ?[place]  Care Management  ?Direct Dial: [phone] ? ? ?

## 2021-12-10 NOTE — Progress Notes (Signed)
?Andres White is a 76 y.o. male with the following history as recorded in EpicCare:  ?Patient Active Problem List  ? Diagnosis Date Noted  ? 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 Abnormal findings on esophagogastroduodenoscopy (EGD) 10/02/2020  ? Constipation 10/02/2020  ? Anemia 10/02/2020  ? History of seizure disorder 10/02/2020  ? History of stroke 10/02/2020  ? History of colon polyps 10/02/2020  ? 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 Endotracheally intubated 09/16/2020  ? Community acquired pneumonia 09/16/2020  ? Cervical spondylosis 09/16/2020  ? Vertebral artery stenosis 09/16/2020  ? Status epilepticus ([place]) 09/16/2020  ? CKD (chronic kidney disease) stage 3, GFR 30-59 ml/min (HCC) 09/16/2020  ? Occult blood in stools   ? Leukocytosis   ? Hyponatremia   ? History of hypertension   ? Dysphagia, post-stroke   ? Dysesthesia   ? Neuropathic pain   ? Brainstem infarct, acute ([place])   ? Acute blood loss anemia   ? AKI (acute kidney injury) ([place])   ? 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 Right leg weakness   ? Left carotid stenosis 04/13/2014  ?  ?Current Outpatient Medications  ?Medication Sig Dispense Refill  ? allopurinol (ZYLOPRIM) 100 MG tablet TAKE 1 TABLET(100 MG) BY MOUTH DAILY 90 tablet 0  ? aspirin EC 81 MG tablet Take 1 tablet (81 mg total) by mouth daily. (Patient taking differently: Take 81 mg by mouth in the morning.) 90 tablet 3  ? atorvastatin (LIPITOR) 40 MG tablet Take 1 tablet (40 mg total) by mouth daily. 90 tablet 1  ? citalopram (CELEXA) 20 MG tablet Take 1 tablet (20 mg total) by mouth daily. 90 tablet 1  ? colchicine 0.6 MG tablet TAKE 1 TABLET BY MOUTH ONCE DAILY 30 tablet 10  ? diclofenac Sodium (VOLTAREN) 1 % GEL Apply 2 g topically 4 (four) times daily as needed (pain). 100 g 2  ? furosemide (LASIX) 20 MG tablet Take 1 tablet (20 mg total) by mouth in the morning. 30 tablet 10  ? gabapentin (NEURONTIN) 300 MG capsule TAKE 1 CAPSULE BY MOUTH THREE TIMES DAILY 90 capsule  10  ? levETIRAcetam (KEPPRA) 500 MG tablet Take 1 tablet (500 mg total) by mouth 2 (two) times daily. 180 tablet 3  ? lisinopril-hydrochlorothiazide (ZESTORETIC) 10-12.5 MG tablet TAKE 1 TABLET BY MOUTH ONCE DAILY 30 tablet 10  ? Multiple Vitamin (MULTIVITAMIN WITH MINERALS) TABS tablet Take 1 tablet by mouth daily. (Patient taking differently: Take 1 tablet by mouth daily at 12 noon.)    ? pantoprazole (PROTONIX) 40 MG tablet Take 1 tablet (40 mg total) by mouth 2 (two) times daily. TAKE 1 TABLET(40 MG) BY MOUTH TWICE DAILY 180 tablet 1  ? QUEtiapine (SEROQUEL) 50 MG tablet TAKE 1 TABLET BY MOUTH AT BEDTIME 30 tablet 8  ? sucralfate (CARAFATE) 1 g tablet TAKE 1 TABLET(1 GRAM) BY MOUTH FOUR TIMES DAILY WITH MEALS AND AT BEDTIME 368 tablet 3  ? tamsulosin (FLOMAX) 0.4 MG CAPS capsule Take 1 capsule (0.4 mg total) by mouth daily after supper. 90 capsule 1  ? acetaminophen (TYLENOL) 325 MG tablet Take 1-2 tablets (325-650 mg total) by mouth every 4 (four) hours as needed for mild pain. (Patient not taking: Reported on 12/10/2021)    ? Ferrous Sulfate (IRON) 325 (65 Fe) MG TABS Take 1 tablet (325 mg total) by mouth 2 (two) times daily before a meal. (Patient taking differently: Take 325 mg by mouth daily.) 360 tablet 0  ? hydrocerin (EUCERIN) CREA  Apply 1 application. topically 2 (two) times daily. 454 g 1  ? phenytoin (DILANTIN) 100 MG ER capsule Take 1 capsule (100 mg total) by mouth 2 (two) times daily for 30 days, THEN 1 capsule (100 mg total) at bedtime. (Patient taking differently: Take 1 capsule THREE TIMES A DAY) 90 capsule 0  ? ?No current facility-administered medications for this visit.  ?  ?Allergies: Patient has no known allergies.  ?Past Medical History:  ?Diagnosis Date  ? Arthritis   ? Chronic back pain   ? Duodenal adenoma   ? Gout   ? Hypertension   ? Stroke [facility]) 06/2020  ? Syncope and collapse   ?  ?Past Surgical History:  ?Procedure Laterality Date  ? BIOPSY  07/13/2020  ? Procedure: BIOPSY;  Surgeon:  Ladene Artist, MD;  Location: [facility] ENDOSCOPY;  Service: Endoscopy;;  ? BIOPSY  11/28/2020  ? Procedure: BIOPSY;  Surgeon: Irving Copas., MD;  Location: [facility] ENDOSCOPY;  Service: Gastroenterology;;  ? CARDIAC CATHETERIZATION  2015  ? [facility]   ? COLONOSCOPY WITH PROPOFOL N/A 07/13/2020  ? Procedure: COLONOSCOPY WITH PROPOFOL;  Surgeon: Ladene Artist, MD;  Location: [facility] ENDOSCOPY;  Service: Endoscopy;  Laterality: N/A;  ? COLONOSCOPY WITH PROPOFOL N/A 11/28/2020  ? Procedure: COLONOSCOPY WITH PROPOFOL;  Surgeon: Mansouraty, Telford Nab., MD;  Location: [facility] ENDOSCOPY;  Service: Gastroenterology;  Laterality: N/A;  ? ENDOSCOPIC MUCOSAL RESECTION N/A 11/28/2020  ? Procedure: ENDOSCOPIC MUCOSAL RESECTION;  Surgeon: Rush Landmark Telford Nab., MD;  Location: [facility] ENDOSCOPY;  Service: Gastroenterology;  Laterality: N/A;  ? ESOPHAGOGASTRODUODENOSCOPY N/A 07/13/2020  ? Procedure: ESOPHAGOGASTRODUODENOSCOPY (EGD);  Surgeon: Ladene Artist, MD;  Location: [facility] ENDOSCOPY;  Service: Endoscopy;  Laterality: N/A;  ? ESOPHAGOGASTRODUODENOSCOPY (EGD) WITH PROPOFOL N/A 11/28/2020  ? Procedure: ESOPHAGOGASTRODUODENOSCOPY (EGD) WITH PROPOFOL;  Surgeon: Rush Landmark Telford Nab., MD;  Location: [facility] ENDOSCOPY;  Service: Gastroenterology;  Laterality: N/A;  ? HEMOSTASIS CLIP PLACEMENT  11/28/2020  ? Procedure: HEMOSTASIS CLIP PLACEMENT;  Surgeon: Irving Copas., MD;  Location: [facility] ENDOSCOPY;  Service: Gastroenterology;;  ? KNEE SURGERY Right   ? POLYPECTOMY  07/13/2020  ? Procedure: POLYPECTOMY;  Surgeon: Ladene Artist, MD;  Location: [facility] ENDOSCOPY;  Service: Endoscopy;;  ? POLYPECTOMY  11/28/2020  ? Procedure: POLYPECTOMY;  Surgeon: Rush Landmark Telford Nab., MD;  Location: [facility] ENDOSCOPY;  Service: Gastroenterology;;  ? [place] INJECTION  11/28/2020  ? Procedure: SUBMUCOSAL LIFTING INJECTION;  Surgeon: Irving Copas., MD;  Location: [facility] ENDOSCOPY;  Service: Gastroenterology;;  ? SUBMUCOSAL TATTOO INJECTION  11/28/2020  ?  Procedure: SUBMUCOSAL TATTOO INJECTION;  Surgeon: Irving Copas., MD;  Location: [facility] ENDOSCOPY;  Service: Gastroenterology;;  ?  ?Family History  ?Problem Relation Age of Onset  ? Hypertension Mother   ? Colon cancer Neg Hx   ? Pancreatic cancer Neg Hx   ? Stomach cancer Neg Hx   ? Esophageal cancer Neg Hx   ? Inflammatory bowel disease Neg Hx   ? Liver disease Neg Hx   ? Rectal cancer Neg Hx   ?  ?Social History  ? ?Tobacco Use  ? Smoking status: Every Day  ?  Packs/day: 1.00  ?  Years: 20.00  ?  Pack years: 20.00  ?  Types: Cigarettes  ? Smokeless tobacco: Never  ?Substance Use Topics  ? Alcohol use: Not Currently  ?  Comment: occasional  ?  ?Subjective:  ? ?Accompanied by daughter who helps provide history; has been lost to follow up in the past year; apparently, former provider  in [place] has been writing medications for patient; overdue to see neurology- OV was due in November 2022; is scheduled to see Dr. Ranell Patrick in June 2023 to follow up on neuropathy/ right leg weakness;  ? ? ? ?Objective:  ?Vitals:  ? 12/10/21 1035  ?BP: 120/72  ?Pulse: (!) 59  ?Resp: 18  ?Temp: 97.9 ?F (36.6 ?C)  ?TempSrc: Oral  ?SpO2: 96%  ?Weight: 198 lb 12.8 oz (90.2 kg)  ?Height: '5\' 7"'$  (1.702 m)  ?  ?General: Well developed, well nourished, in no acute distress  ?Skin : Warm and dry.  ?Head: Normocephalic and atraumatic  ?Eyes: Sclera and conjunctiva clear; pupils round and reactive to light; extraocular movements intact  ?Ears: External normal; canals clear; tympanic membranes normal  ?Oropharynx: Pink, supple. No suspicious lesions  ?Neck: Supple without thyromegaly, adenopathy  ?Lungs: Respirations unlabored; clear to auscultation bilaterally without wheeze, rales, rhonchi  ?CVS exam: normal rate and regular rhythm.  ?Neurologic: Alert and oriented; speech intact; face symmetrical; in wheelchair;  ? ?Assessment:  ?1. PE (physical exam), annual   ?2. Recurrent falls   ?3. Right leg weakness   ?4. Hyperlipidemia,  unspecified hyperlipidemia type   ?5. Iron deficiency anemia, unspecified iron deficiency anemia type   ?6. Low vitamin B12 level   ?7. Prostate cancer screening   ?8. Tobacco abuse   ?  ?Plan:  ?Update labs a

## 2021-12-10 NOTE — Patient Instructions (Signed)
Please call your neurologist to make your follow up as discussed.  ?

## 2021-12-11 ENCOUNTER — Other Ambulatory Visit: Payer: Self-pay | Admitting: Family

## 2021-12-11 ENCOUNTER — Encounter: Payer: Self-pay | Admitting: *Deleted

## 2021-12-11 DIAGNOSIS — R972 Elevated prostate specific antigen [PSA]: Secondary | ICD-10-CM

## 2021-12-11 LAB — IRON,TIBC AND FERRITIN PANEL
%SAT: 21 % (calc) (ref 20–48)
Ferritin: 78 ng/mL (ref 24–380)
Iron: 50 ug/dL (ref 50–180)
TIBC: 237 mcg/dL (calc) — ABNORMAL LOW (ref 250–425)

## 2021-12-11 NOTE — Patient Instructions (Signed)
Visit Information  ? ?Thank you for taking time to visit with me today. Please don't hesitate to contact me if I can be of assistance to you before our next scheduled telephone appointment. ? ?Following are the goals we discussed today:  ?Patient Goals/Self-Care Activities:  ?Daughter will work with [employer], and [employer], on a bi-weekly basis, in an effort to establish in-home care services, Meals-on-Wheels, and Adult Day Care Program services for you.    ?Daughter will review the following list of agencies and resources, and begin contacting agencies of interest, to [employer], availability, flexibility, credentialing, etc.       ?~ [place] ?~ [place] ?~ [place] Service Providers ?Daughter will receive the following brochures, to become more knowledgeable about resources, services, and activities offered to seniors in your community: ?~ Development worker, community of [employer] ([place]) ?~ [employer] [facility] [employer] ?~ Adult Day Care Programs ?Daughter encouraged to contact LCSW (# [phone]) directly, if she has questions, needs assistance, or if additional social work needs are identified between now and our next scheduled telephone outreach call. ?Remember: ?Always use handrails on the stairs. ?Always use your cane or walker when ambulating. ?Always wear your glasses, especially at night or in areas not well lit.  ?Follow-Up Plan: LCSW will follow-up with patient's daughter by telephone on 12/23/2021 at 10:45 am ? ?Please call the care guide team at [phone] if you need to cancel or reschedule your appointment.  ? ?If you are experiencing a Mental Health or [place] or need someone to talk to, please call the Suicide and Crisis Lifeline: 988 ?call the [place] National Suicide Prevention Lifeline: 260 445 5170 or TTY: (610)882-9170 TTY 920-259-7212) to talk to a trained counselor ?call 1-800-273-TALK (toll  free, 24 hour hotline) ?go to [facility] [address], [place] [phone]) ?call the [facility]: [phone] ?call 911  ? ?Following is a copy of your full care plan:  ?Care Plan : [place]  ?Updates made by Francis Gaines, LCSW since 12/11/2021 12:00 AM  ?  ? ?Problem: Find Help in My Community.   ?Priority: High  ?  ? ?Goal: Find Help in My Community.   ?Start Date: 12/10/2021  ?Expected End Date: 03/12/2022  ?This Visit's Progress: On track  ?Priority: High  ?Note:   ?Current Barriers:   ?Patient with Neuropathic Pain, Acute Kidney Injury, Stage III Chronic Kidney Disease, Acute Brainstem Infarct, Cerebrum Stroke, and History of Seizure Disorder needs Support, Education, Resources, Referrals, Advocacy, and Care Coordination, to resolve unmet personal care needs in the home. ?Patient is unable to self-administer medications as prescribed, or consistently perform ADL's/IADL's independently. ?Patient lacks knowledge of available community agencies and resources. ?Clinical Goals:  ?Patient will have in-home care services in place, through a private agency of choice, from the list provided.   ?Patient will have Meals-on-Wheels in place, through [employer] of [place]. ?Patient will attend an Adult Day Care Program, either through [facility] ([facility] of [place] for the Elderly) or [facility] ([place] [facility]). ?Patient's daughter will work with LCSW, and [employer], in an effort to obtain in-home care services, Meals-on-Wheels, and enroll in an Adult Day Care Program of choice.   ?Patient will demonstrate improved health management independence, as evidenced by having in-home care services, Meals-on-Wheels, and Adult Day Care Program services in place. ?Interventions: ?Patient's daughter interviewed and appropriate assessments performed. ?  Collaboration with Primary Care Provider, Family Nurse Practitioner, Marrian Salvage regarding development and update of comprehensive plan of care, as evidenced by provider attestation and co-signature. ?Inter-disciplinary care team collaboration (see longitudinal plan of care). ?Identified resources and durable medical equipment needed in the home, to improve safety and promote independence.  ?Clinical Interventions: ?Problem Solving/Task Centered Solutions Developed. ?Quality of Sleep Assessed and Sleep Hygiene Techniques Promoted. ?Caregiver Stress Acknowledged. ?Consideration of In-Home Care Services Encouraged. ?Verbalization of Feelings Discussed. ?Medication Compliance Emphasized. ?Discussed plans with patient's daughter for ongoing care management follow-up, and provided direct contact information for care management team. ?Assisted patient's daughter with obtaining information about health plan benefits, through [employer].     ?Provided education to patient's daughter regarding level of care options (I.e Senior Living, Assisted Living), but daughter would like for patient to continue to reside in her home for as long as possible. ?Assessed needs, level of care concerns, basic eligibility, and provided education on in-home care services referral process, Meals-on-Wheels referral process, and process for enrollment into an Adult Day Care Program.   ?Mailed patient's daughter a list of in-home care services, a list of Adult Day Care Programs, and a brochure for [employer], through [employer] of [place]. ?Patient Goals/Self-Care Activities:  ?Daughter will work with [employer], and [employer], on a bi-weekly basis, in an effort to establish in-home care services, Meals-on-Wheels, and Adult Day Care Program services for you.    ?Daughter will review the following list of agencies and resources, and begin contacting agencies of interest, to [employer], availability, flexibility, credentialing, etc.       ?~ [place] ?~ [place] ?~ [place] Service Providers ?Daughter will receive the following brochures, to become more knowledgeable about resources, services, and activities offered to seniors in your community: ?~ Development worker, community of [employer] ([place]) ?~ [employer] ?~ Adult Day Care Programs ?Daughter encouraged to contact LCSW (# [phone]) directly, if she has questions, needs assistance, or if additional social work needs are identified between now and our next scheduled telephone outreach call. ?Remember: ?Always use handrails on the stairs. ?Always use your cane or walker when ambulating. ?Always wear your glasses, especially at night or in areas not well lit.  ?Follow-Up Plan: LCSW will follow-up with patient's daughter by telephone on 12/23/2021 at 10:45 am ?  ? ? ?Consent to CCM Services: ?Mr. Booton was given information about Chronic Care Management services including:  ?CCM service includes personalized support from designated clinical staff supervised by his physician, including individualized plan of care and coordination with other care providers ?24/7 contact phone numbers for assistance for urgent and routine care needs. ?Service will only be billed when office clinical staff spend 20 minutes or more in a month to coordinate care. ?Only one practitioner may furnish and bill the service in a calendar month. ?The patient may stop CCM services at any time (effective at the end of the month) by phone call to the office staff. ?The patient will be responsible for cost sharing (co-pay) of up to 20% of the service fee (after annual deductible is met). ? ?Patient agreed to services and verbal consent obtained.  ? ?Patient verbalizes understanding of instructions and care plan provided today and agrees to view in [place]. Active MyChart status confirmed with patient.   ? ?Telephone follow up appointment with care management team  member scheduled for:  12/23/2021 at 10:45  am ? ?Nat Christen LCSW ?Licensed Clinical Social Worker ?[place]

## 2021-12-11 NOTE — Chronic Care Management (AMB) (Signed)
?Chronic Care Management  ? ? Clinical Social Work Note ? ?12/11/2021 ?Name: Andres White MRN: [id number] DOB: 14-Aug-1945 ? ?Andres White is a 76 y.o. year old male who is a primary care patient of Marrian Salvage, [place]. The CCM team was consulted to assist the patient with chronic disease management and/or care coordination needs related to: [employer], Food Insecurity, Level of Care Concerns, and Caregiver Stress.  ? ?Engaged with patient's daughter by telephone for initial visit in response to provider referral for social work chronic care management and care coordination services.  ? ?Consent to Services:  ?The patient was given information about Chronic Care Management services, agreed to services, and gave verbal consent prior to initiation of services.  Please see initial visit note for detailed documentation.  ? ?Patient agreed to services and consent obtained.  ? ?Assessment: Review of patient past medical history, allergies, medications, and health status, including review of relevant consultants reports was performed today as part of a comprehensive evaluation and provision of chronic care management and care coordination services.    ? ?SDOH (Social Determinants of Health) assessments and interventions performed:  ?SDOH Interventions   ? ?Flowsheet Row Most Recent Value  ?SDOH Interventions   ?Food Insecurity Interventions Intervention Not Indicated, Other (Comment)  [Verified by Daughter [name] Womack]  ?Financial Strain Interventions Intervention Not Indicated, Other (Comment)  [Verified by Daughter [name] Womack]  ?Housing Interventions Intervention Not Indicated, Other (Comment)  [Verified by Daughter [name] Womack]  ?Intimate Partner Violence Interventions Intervention Not Indicated, Other (Comment)  [Verified by Daughter [name] Womack]  ?Physical Activity Interventions Patient Refused, Other (Comments)  [Verified by Daughter [name] Womack]  ?Stress Interventions  Intervention Not Indicated, Other (Comment), Offered [employer]  [Verified by Daughter - [name] Womack]  ?Social Connections Interventions Intervention Not Indicated, Other (Comment)  [Verified by Daughter [name] Womack]  ?Transportation Interventions Intervention Not Indicated, Other (Comment)  [Verified by Daughter [name] Womack]  ? ?  ?  ? ?Advanced Directives Status: Already in Place. ? ?CCM Care Plan ? ?No Known Allergies ? ?Outpatient Encounter Medications as of 12/10/2021  ?Medication Sig  ? acetaminophen (TYLENOL) 325 MG tablet Take 1-2 tablets (325-650 mg total) by mouth every 4 (four) hours as needed for mild pain. (Patient not taking: Reported on 12/10/2021)  ? allopurinol (ZYLOPRIM) 100 MG tablet TAKE 1 TABLET(100 MG) BY MOUTH DAILY  ? aspirin EC 81 MG tablet Take 1 tablet (81 mg total) by mouth daily. (Patient taking differently: Take 81 mg by mouth in the morning.)  ? atorvastatin (LIPITOR) 40 MG tablet Take 1 tablet (40 mg total) by mouth daily.  ? citalopram (CELEXA) 20 MG tablet Take 1 tablet (20 mg total) by mouth daily.  ? colchicine 0.6 MG tablet TAKE 1 TABLET BY MOUTH ONCE DAILY  ? diclofenac Sodium (VOLTAREN) 1 % GEL Apply 2 g topically 4 (four) times daily as needed (pain).  ? Ferrous Sulfate (IRON) 325 (65 Fe) MG TABS Take 1 tablet (325 mg total) by mouth 2 (two) times daily before a meal. (Patient taking differently: Take 325 mg by mouth daily.)  ? furosemide (LASIX) 20 MG tablet Take 1 tablet (20 mg total) by mouth in the morning.  ? gabapentin (NEURONTIN) 300 MG capsule TAKE 1 CAPSULE BY MOUTH THREE TIMES DAILY  ? hydrocerin (EUCERIN) CREA Apply 1 application. topically 2 (two) times daily.  ? levETIRAcetam (KEPPRA) 500 MG tablet Take 1 tablet (500 mg total)  by mouth 2 (two) times daily.  ? lisinopril-hydrochlorothiazide (ZESTORETIC) 10-12.5 MG tablet TAKE 1 TABLET BY MOUTH ONCE DAILY  ? Multiple Vitamin (MULTIVITAMIN WITH MINERALS) TABS tablet Take 1 tablet by mouth  daily. (Patient taking differently: Take 1 tablet by mouth daily at 12 noon.)  ? pantoprazole (PROTONIX) 40 MG tablet Take 1 tablet (40 mg total) by mouth 2 (two) times daily. TAKE 1 TABLET(40 MG) BY MOUTH TWICE DAILY  ? phenytoin (DILANTIN) 100 MG ER capsule Take 1 capsule (100 mg total) by mouth 2 (two) times daily for 30 days, THEN 1 capsule (100 mg total) at bedtime. (Patient taking differently: Take 1 capsule THREE TIMES A DAY)  ? QUEtiapine (SEROQUEL) 50 MG tablet TAKE 1 TABLET BY MOUTH AT BEDTIME  ? sucralfate (CARAFATE) 1 g tablet TAKE 1 TABLET(1 GRAM) BY MOUTH FOUR TIMES DAILY WITH MEALS AND AT BEDTIME  ? tamsulosin (FLOMAX) 0.4 MG CAPS capsule Take 1 capsule (0.4 mg total) by mouth daily after supper.  ? ?No facility-administered encounter medications on file as of 12/10/2021.  ? ? ?Patient Active Problem List  ? Diagnosis Date Noted  ? 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 Abnormal findings on esophagogastroduodenoscopy (EGD) 10/02/2020  ? Constipation 10/02/2020  ? Anemia 10/02/2020  ? History of seizure disorder 10/02/2020  ? History of stroke 10/02/2020  ? History of colon polyps 10/02/2020  ? 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 Endotracheally intubated 09/16/2020  ? Community acquired pneumonia 09/16/2020  ? Cervical spondylosis 09/16/2020  ? Vertebral artery stenosis 09/16/2020  ? Status epilepticus ([place]) 09/16/2020  ? CKD (chronic kidney disease) stage 3, GFR 30-59 ml/min (HCC) 09/16/2020  ? Occult blood in stools   ? Leukocytosis   ? Hyponatremia   ? History of hypertension   ? Dysphagia, post-stroke   ? Dysesthesia   ? Neuropathic pain   ? Brainstem infarct, acute ([place])   ? Acute blood loss anemia   ? AKI (acute kidney injury) ([place])   ? 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 Right leg weakness   ? Left carotid stenosis 04/13/2014  ? ? ?Conditions to be addressed/monitored: HTN and CKD Stage III. Film/video editor, Limited Access to Food, Level of Care Concerns, ADL/IADL  Limitations, Limited Access to Caregiver, and Lacks Knowledge of [employer]. ? ?Care Plan : LCSW Plan of Care  ?Updates made by Francis Gaines, LCSW since 12/11/2021 12:00 AM  ?  ? ?Problem: Find Help in My Community.   ?Priority: High  ?  ? ?Goal: Find Help in My Community.   ?Start Date: 12/10/2021  ?Expected End Date: 03/12/2022  ?This Visit's Progress: On track  ?Priority: High  ?Note:   ?Current Barriers:   ?Patient with Neuropathic Pain, Acute Kidney Injury, Stage III Chronic Kidney Disease, Acute Brainstem Infarct, Cerebrum Stroke, and History of Seizure Disorder needs Support, Education, Resources, Referrals, Advocacy, and Care Coordination, to resolve unmet personal care needs in the home. ?Patient is unable to self-administer medications as prescribed, or consistently perform ADL's/IADL's independently. ?Patient lacks knowledge of available community agencies and resources. ?Clinical Goals:  ?Patient will have in-home care services in place, through a private agency of choice, from the list provided.   ?Patient will have Meals-on-Wheels in place, through [employer] of [place]. ?Patient will attend an Adult Day Care Program, either through PACE (Program of [place] for the Elderly) or ACE ([place] [facility]rich[facility]). ?Patient's daughter will work with LCSW, and [facility], in an effort to obtain  in-home care services, Meals-on-Wheels, and enroll in an Adult Day Care Program of choice.   ?Patient will demonstrate improved health management independence, as evidenced by having in-home care services, Meals-on-Wheels, and Adult Day Care Program services in place. ?Interventions: ?Patient's daughter interviewed and appropriate assessments performed. ?Collaboration with Primary Care Provider, Family Nurse Practitioner, Marrian Salvage regarding development and update of comprehensive plan of care, as evidenced by provider attestation and  co-signature. ?Inter-disciplinary care team collaboration (see longitudinal plan of care). ?Identified resources and durable medical equipment needed in the home, to improve safety and promote independence.  ?Clinical Interventions: ?Problem

## 2021-12-12 ENCOUNTER — Telehealth: Payer: Self-pay

## 2021-12-12 ENCOUNTER — Other Ambulatory Visit: Payer: Self-pay | Admitting: Cardiovascular Disease

## 2021-12-12 NOTE — Telephone Encounter (Signed)
? ?  Telephone encounter was:  Successful.  ?12/12/2021 ?Name: DASEAN BROW MRN: [id number] DOB: July 07, 1946 ? ?OLOF MARCIL is a 76 y.o. year old male who is a primary care patient of Marrian Salvage, [place] . The community resource team was consulted for assistance with Food Insecurity, Medical/Personal supplies, and Adult Day Programs ? ?Care guide performed the following interventions: Patient provided with information about care guide support team and interviewed to confirm resource needs. ? ?Follow Up Plan:  Care guide will outreach resources to assist patient with current needs listed above. ? ?Cameron Proud ?Care Guide, Embedded Care Coordination ?[facility] Health  Care management  ?[place], [place]  ?Main Phone: [phone]  E-mail: [email or web address]  ?Website: [email or web address] ? ? ? ?

## 2021-12-16 ENCOUNTER — Telehealth: Payer: Self-pay | Admitting: Cardiovascular Disease

## 2021-12-16 NOTE — Telephone Encounter (Signed)
?*  STAT* If patient is at the pharmacy, call can be transferred to refill team. ? ? ?1. Which medications need to be refilled? (please list name of each medication and dose if known)  ? QUEtiapine (SEROQUEL) 50 MG tablet  ? ?2. Which pharmacy/location (including street and city if local pharmacy) is medication to be sent to?  ?[place] they need a 30 day or 90 day supply?  ?30 day ? ?

## 2021-12-17 ENCOUNTER — Other Ambulatory Visit: Payer: Self-pay | Admitting: Family

## 2021-12-17 MED ORDER — QUETIAPINE FUMARATE 50 MG PO TABS: 50.0000 mg | ORAL_TABLET | Freq: Every day | ORAL | 3 refills | Status: DC

## 2021-12-20 ENCOUNTER — Other Ambulatory Visit: Payer: Self-pay | Admitting: Cardiovascular Disease

## 2021-12-23 ENCOUNTER — Ambulatory Visit: Payer: Medicare Other | Admitting: *Deleted

## 2021-12-23 DIAGNOSIS — Z8673 Personal history of transient ischemic attack (TIA), and cerebral infarction without residual deficits: Secondary | ICD-10-CM

## 2021-12-23 DIAGNOSIS — I636 Cerebral infarction due to cerebral venous thrombosis, nonpyogenic: Secondary | ICD-10-CM

## 2021-12-23 DIAGNOSIS — M792 Neuralgia and neuritis, unspecified: Secondary | ICD-10-CM

## 2021-12-23 DIAGNOSIS — N1831 Chronic kidney disease, stage 3a: Secondary | ICD-10-CM

## 2021-12-23 DIAGNOSIS — N179 Acute kidney failure, unspecified: Secondary | ICD-10-CM

## 2021-12-23 DIAGNOSIS — I6389 Other cerebral infarction: Secondary | ICD-10-CM

## 2021-12-23 NOTE — Chronic Care Management (AMB) (Signed)
Chronic Care Management    Clinical Social Work Note  12/23/2021 Name: Andres White MRN: [id number] DOB: 1946/02/15  Andres White is a 76 y.o. year old male who is a primary care patient of Marrian Salvage, [place]. The CCM team was consulted to assist the patient with chronic disease management and/or care coordination needs related to: Appointment Scheduling Needs, Community Resources, Level of Care Concerns, and Caregiver Stress.   Engaged with patient's daughter by telephone for follow up visit in response to provider referral for social work chronic care management and care coordination services.   Consent to Services:  The patient was given information about Chronic Care Management services, agreed to services, and gave verbal consent prior to initiation of services.  Please see initial visit note for detailed documentation.   Patient agreed to services and consent obtained.   Assessment: Review of patient past medical history, allergies, medications, and health status, including review of relevant consultants reports was performed today as part of a comprehensive evaluation and provision of chronic care management and care coordination services.     SDOH (Social Determinants of Health) assessments and interventions performed:    Advanced Directives Status: Not addressed in this encounter.  CCM Care Plan  No Known Allergies  Outpatient Encounter Medications as of 12/23/2021  Medication Sig   acetaminophen (TYLENOL) 325 MG tablet Take 1-2 tablets (325-650 mg total) by mouth every 4 (four) hours as needed for mild pain. (Patient not taking: Reported on 12/10/2021)   allopurinol (ZYLOPRIM) 100 MG tablet TAKE 1 TABLET(100 MG) BY MOUTH DAILY   aspirin EC 81 MG tablet Take 1 tablet (81 mg total) by mouth daily. (Patient taking differently: Take 81 mg by mouth in the morning.)   atorvastatin (LIPITOR) 40 MG tablet Take 1 tablet (40 mg total) by mouth daily.   citalopram (CELEXA) 20  MG tablet Take 1 tablet (20 mg total) by mouth daily.   colchicine 0.6 MG tablet TAKE 1 TABLET BY MOUTH ONCE DAILY   diclofenac Sodium (VOLTAREN) 1 % GEL Apply 2 g topically 4 (four) times daily as needed (pain).   Ferrous Sulfate (IRON) 325 (65 Fe) MG TABS Take 1 tablet (325 mg total) by mouth 2 (two) times daily before a meal. (Patient taking differently: Take 325 mg by mouth daily.)   furosemide (LASIX) 20 MG tablet Take 1 tablet (20 mg total) by mouth in the morning.   gabapentin (NEURONTIN) 300 MG capsule TAKE 1 CAPSULE BY MOUTH THREE TIMES DAILY   hydrocerin (EUCERIN) CREA Apply 1 application. topically 2 (two) times daily.   levETIRAcetam (KEPPRA) 500 MG tablet Take 1 tablet (500 mg total) by mouth 2 (two) times daily.   lisinopril-hydrochlorothiazide (ZESTORETIC) 10-12.5 MG tablet TAKE 1 TABLET BY MOUTH ONCE DAILY   Multiple Vitamin (MULTIVITAMIN WITH MINERALS) TABS tablet Take 1 tablet by mouth daily. (Patient taking differently: Take 1 tablet by mouth daily at 12 noon.)   pantoprazole (PROTONIX) 40 MG tablet Take 1 tablet (40 mg total) by mouth 2 (two) times daily. TAKE 1 TABLET(40 MG) BY MOUTH TWICE DAILY   phenytoin (DILANTIN) 100 MG ER capsule Take 1 capsule (100 mg total) by mouth 2 (two) times daily for 30 days, THEN 1 capsule (100 mg total) at bedtime. (Patient taking differently: Take 1 capsule THREE TIMES A DAY)   QUEtiapine (SEROQUEL) 50 MG tablet Take 1 tablet (50 mg total) by mouth at bedtime.   sucralfate (CARAFATE) 1 g tablet TAKE 1 TABLET(1 GRAM) BY  MOUTH FOUR TIMES DAILY WITH MEALS AND AT BEDTIME   tamsulosin (FLOMAX) 0.4 MG CAPS capsule Take 1 capsule (0.4 mg total) by mouth daily after supper.   No facility-administered encounter medications on file as of 12/23/2021.    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 10/02/2020   Endotracheally intubated 09/16/2020   Community acquired pneumonia 09/16/2020   Cervical spondylosis 09/16/2020   Vertebral artery stenosis 09/16/2020   Status epilepticus ([place]) 09/16/2020   CKD (chronic kidney disease) stage 3, GFR 30-59 ml/min (HCC) 09/16/2020   Occult blood in stools    Leukocytosis    Hyponatremia    History of hypertension    Dysphagia, post-stroke    Dysesthesia    Neuropathic pain    Brainstem infarct, acute (HCC)    Acute blood loss anemia    AKI (acute kidney injury) ([place])    Stroke (cerebrum) ([place]) 06/27/2020   Right leg weakness    Left carotid stenosis 04/13/2014    Conditions to be addressed/monitored:  Acute Kidney Injury, Stroke, and Stage III Chronic Kidney Disease.  Limited Social Support, Level of Care Concerns, ADL/IADL Limitations, Social Isolation, Limited Access to Caregiver, and Lacks Knowledge of [employer].  Care Plan : LCSW Plan of Care  Updates made by Francis Gaines, LCSW since 12/23/2021 12:00 AM     Problem: Find Help in My Community.   Priority: High     Goal: Find Help in My Community.   Start Date: 12/10/2021  Expected End Date: 03/12/2022  This Visit's Progress: On track  Recent Progress: On track  Priority: High  Note:   Current Barriers:   Patient with Neuropathic Pain, Acute Kidney Injury, Stage III Chronic Kidney Disease, Acute Brainstem Infarct, Cerebrum Stroke, and History of Seizure Disorder needs Support, Education, Resources, Referrals, Advocacy, and Care Coordination, to resolve unmet personal care needs in the home. Patient is unable to self-administer medications as prescribed, or consistently perform ADL's/IADL's independently. Patient lacks knowledge of available community agencies and resources. Clinical Goals:  Patient will have  in-home care services in place, through a private agency of choice, from the list provided.   Patient will have Meals-on-Wheels in place, through [employer] [facility] [place]. Patient will attend an Adult Day Care Program, either through PACE (Program of [place] for the Elderly) or ACE ([facility]). Patient's daughter will work with LCSW, and [facility], in an effort to obtain in-home care services, Meals-on-Wheels, and enroll in an Adult Day Care Program of choice.   Patient will demonstrate improved health management independence, as evidenced by having in-home care services, Meals-on-Wheels, and Adult Day Care Program services in place. Interventions: Collaboration with Primary Care Provider, Family Nurse Practitioner, Marrian Salvage regarding development and update of comprehensive plan of care, as evidenced by provider attestation and co-signature. Inter-disciplinary care team collaboration (see longitudinal plan of care). Clinical Interventions: Problem Solving/Task Centered Solutions Developed. Caregiver Stress Acknowledged. Consideration of In-Home Care Services Encouraged. Verbalization of Feelings Discussed. Patient Goals/Self-Care Activities:  Daughter will continue to work with LCSW, and [employer], on a bi-weekly basis, in an effort to establish in-home care services, Meals-on-Wheels, and Adult Day Care Program services  for you.    Daughter will begin contacting the following list of agencies, to check pricing, availability, flexibility, credentialing, etc., in an effort to establish services:      ~ [place] ~ [place] ~ [place] Providers Daughter will review the following list of brochures, to become more knowledgeable about resources, services, and activities offered to seniors in your community, and consider enrollment: ~ Development worker, community of [employer]  ([place]) ~ [employer] ~ Adult Day Care Programs Daughter encouraged to contact LCSW (# [phone]) directly, if she has questions, needs assistance, or if additional social work needs are identified between now and our next scheduled telephone outreach call. Follow-Up Plan: LCSW will follow-up with patient's daughter by telephone on 01/07/2022 at 12:45 pm.   Nat Christen LCSW Licensed Clinical Social Worker [facility] Med [facility] [phone]

## 2021-12-23 NOTE — Patient Instructions (Signed)
Visit Information  Thank you for taking time to visit with me today. Please don't hesitate to contact me if I can be of assistance to you before our next scheduled telephone appointment.  Following are the goals we discussed today:  Patient Goals/Self-Care Activities:  Daughter will continue to work with LCSW, and [facility], on a bi-weekly basis, in an effort to establish in-home care services, Meals-on-Wheels, and Adult Day Care Program services for you.    Daughter will begin contacting the following list of agencies, to check pricing, availability, flexibility, credentialing, etc., in an effort to establish services:      ~ [place] ~ [place] ~ [place] Providers Daughter will review the following list of brochures, to become more knowledgeable about resources, services, and activities offered to seniors in your community, and consider enrollment: ~ Development worker, community of [employer] ([place]) ~ [employer] ~ Adult Day Care Programs Daughter encouraged to contact LCSW (# [phone]) directly, if she has questions, needs assistance, or if additional social work needs are identified between now and our next scheduled telephone outreach call. Follow-Up Plan: LCSW will follow-up with patient's daughter by telephone on 01/07/2022 at 12:45 pm.  Please call the care guide team at [phone] if you need to cancel or reschedule your appointment.   If you are experiencing a Mental Health or [place] or need someone to talk to, please call the Suicide and Crisis Lifeline: 988 call the [place] National Suicide Prevention Lifeline: (774)382-3706 or TTY: 819-888-0548 TTY 435-624-3406) to talk to a trained counselor call 1-800-273-TALK (toll free, 24 hour hotline) go to [facility] [address], [place] [phone]) call the  [place]: [phone] call 911   Patient verbalizes understanding of instructions and care plan provided today and agrees to view in [place]. Active MyChart status and patient understanding of how to access instructions and care plan via MyChart confirmed with patient.     [place] Licensed Clinical Social Worker [facility] Med [employer] [phone]

## 2021-12-27 ENCOUNTER — Telehealth: Payer: Self-pay

## 2021-12-27 NOTE — Telephone Encounter (Signed)
   Telephone encounter was:  Successful.  12/27/2021 Name: Andres White MRN: [id number] DOB: 1946/06/23  Andres White is a 76 y.o. year old male who is a primary care patient of Marrian Salvage, [place] . The community resource team was consulted for assistance with Food Insecurity, Caregiver Stress, and Adult care Programs  Care guide performed the following interventions: Follow up call placed to the patient to discuss status of referral. Daughter has been advised that resource resources has adult diapers and bedside commode. Alexis from [facility] advised daughter can pick up supplies today. CG will continue to assist on request as daughter would like CG to send in-home care, respite care and ALF's  Follow Up Plan:  Care guide will outreach resources to assist patient with needs listed above  [place], [place]  Main Phone: [phone]  E-mail: [email or web address]  Website: [email or web address]

## 2022-01-01 ENCOUNTER — Telehealth: Payer: Self-pay

## 2022-01-01 DIAGNOSIS — I129 Hypertensive chronic kidney disease with stage 1 through stage 4 chronic kidney disease, or unspecified chronic kidney disease: Secondary | ICD-10-CM

## 2022-01-01 DIAGNOSIS — N1831 Chronic kidney disease, stage 3a: Secondary | ICD-10-CM

## 2022-01-01 NOTE — Telephone Encounter (Signed)
   Telephone and Mail encounter was:  Successful.  01/01/2022 Name: JAIME GRIZZELL MRN: [id number] DOB: 01/29/1946  [name] is a 76 y.o. year old male who is a primary care patient of Marrian Salvage, [place] . The community resource team was consulted for assistance with Food Insecurity, Caregiver Stress, and Medical equipment and supplies  Care guide performed the following interventions: Follow up call placed to the patient to discuss status of referral. CG spoke to daughter to confirm resources needed before sending them out in the mail. Daughter advised everything is correct. CG will be sending [place], Senior Activities ([place], Social research officer, government), Meals on Wheels information, [place], [facility] Directory, [place] and Calendar.  Follow Up Plan:  Care guide will follow up with patient by phone over the next few days to check with daughter that mail has been received.   [place] management  [place], [place]  Main Phone: [phone]  E-mail: [email or web address]  Website: [email or web address]

## 2022-01-03 ENCOUNTER — Other Ambulatory Visit: Payer: Self-pay | Admitting: Family Medicine

## 2022-01-03 DIAGNOSIS — G40901 Epilepsy, unspecified, not intractable, with status epilepticus: Secondary | ICD-10-CM

## 2022-01-03 DIAGNOSIS — I69398 Other sequelae of cerebral infarction: Secondary | ICD-10-CM

## 2022-01-06 ENCOUNTER — Other Ambulatory Visit: Payer: Self-pay | Admitting: Adult Health

## 2022-01-06 NOTE — Progress Notes (Signed)
error 

## 2022-01-06 NOTE — Telephone Encounter (Signed)
At prior visit, discussed tapering off from Dilantin and continuing on Keppra. Okay to refill keppra - please follow up regarding Dilantin and current dose he is on - taper instructions provided at prior visit but does not look like he tapered off as advised.

## 2022-01-07 ENCOUNTER — Other Ambulatory Visit: Payer: Self-pay | Admitting: *Deleted

## 2022-01-07 ENCOUNTER — Ambulatory Visit (INDEPENDENT_AMBULATORY_CARE_PROVIDER_SITE_OTHER): Payer: Medicare Other | Admitting: *Deleted

## 2022-01-07 ENCOUNTER — Telehealth: Payer: Self-pay | Admitting: *Deleted

## 2022-01-07 DIAGNOSIS — M792 Neuralgia and neuritis, unspecified: Secondary | ICD-10-CM

## 2022-01-07 DIAGNOSIS — I6503 Occlusion and stenosis of bilateral vertebral arteries: Secondary | ICD-10-CM

## 2022-01-07 DIAGNOSIS — G40901 Epilepsy, unspecified, not intractable, with status epilepticus: Secondary | ICD-10-CM

## 2022-01-07 DIAGNOSIS — N179 Acute kidney failure, unspecified: Secondary | ICD-10-CM

## 2022-01-07 DIAGNOSIS — N1831 Chronic kidney disease, stage 3a: Secondary | ICD-10-CM

## 2022-01-07 DIAGNOSIS — R296 Repeated falls: Secondary | ICD-10-CM

## 2022-01-07 DIAGNOSIS — I636 Cerebral infarction due to cerebral venous thrombosis, nonpyogenic: Secondary | ICD-10-CM

## 2022-01-07 DIAGNOSIS — Z8673 Personal history of transient ischemic attack (TIA), and cerebral infarction without residual deficits: Secondary | ICD-10-CM

## 2022-01-07 DIAGNOSIS — I69391 Dysphagia following cerebral infarction: Secondary | ICD-10-CM

## 2022-01-07 DIAGNOSIS — I69398 Other sequelae of cerebral infarction: Secondary | ICD-10-CM

## 2022-01-07 MED ORDER — LEVETIRACETAM 500 MG PO TABS: 500.0000 mg | ORAL_TABLET | Freq: Two times a day (BID) | ORAL | 3 refills | Status: DC

## 2022-01-07 MED ORDER — PHENYTOIN SODIUM EXTENDED 100 MG PO CAPS: 100.0000 mg | ORAL_CAPSULE | Freq: Three times a day (TID) | ORAL | 5 refills | Status: DC

## 2022-01-07 NOTE — Patient Instructions (Signed)
Visit Information  Thank you for taking time to visit with me today. Please don't hesitate to contact me if I can be of assistance to you before our next scheduled telephone appointment.  Following are the goals we discussed today:  Patient Goals/Self-Care Activities:  Daughter will continue to work with [facility], in an effort to establish in-home care services, Meals-on-Wheels, and Adult Day Care Program services for you.    Daughter will continue contacting the following list of agencies, to check pricing, availability, flexibility, credentialing, etc., in an effort to establish services:      ~ [place] ~ [place] ~ [place] Providers Daughter will review the following list of brochures, to become more knowledgeable about resources, services, and activities offered to seniors in your community, and consider enrollment: ~ Development worker, community of [employer] ([place] ~ [employer] ~ Adult Day Care Programs Daughter encouraged to contact LCSW (# [phone]) directly, if she has questions, needs assistance, or if additional social work needs are identified in the near future.   No Follow-Up Required.    Please call the care guide team at [phone] if you need to cancel or reschedule your appointment.   If you are experiencing a Mental Health or [place] or need someone to talk to, please call the Suicide and Crisis Lifeline: 988 call the [place] National Suicide Prevention Lifeline: 731-200-4226 or TTY: 478-636-1889 TTY (224) 495-2890) to talk to a trained counselor call 1-800-273-TALK (toll free, 24 hour hotline) go to [facility] [address] [place] [phone]) call the [place]: [phone] call 911   Patient verbalizes understanding of instructions and care plan provided today and agrees to  view in [place]. Active MyChart status and patient understanding of how to access instructions and care plan via MyChart confirmed with patient.     [place] Licensed Clinical Social Worker [facility] [employer] [facility] [phone]

## 2022-01-07 NOTE — Telephone Encounter (Signed)
Called Heather and advised refills were sent. NP wants to see patient again to discuss Dilantin taper. He will stay on 100 mg three x a day for now. Scheduled FU in two weeks Heather verbalized understanding, appreciation.

## 2022-01-07 NOTE — Chronic Care Management (AMB) (Signed)
Chronic Care Management    Clinical Social Work Note  01/07/2022 Name: Andres White MRN: [id number] DOB: August 02, 1946  Andres White is a 76 y.o. year old male who is a primary care patient of Andres White, Andres White. The CCM team was consulted to assist the patient with chronic disease management and/or care coordination needs related to: Andres White, Food Insecurity, Level of Care Concerns, and Caregiver Stress.   Engaged with patient's daughter by telephone for follow up visit in response to provider referral for social work chronic care management and care coordination services.   Consent to Services:  The patient was given information about Chronic Care Management services, agreed to services, and gave verbal consent prior to initiation of services.  Please see initial visit note for detailed documentation.   Patient agreed to services and consent obtained.   Assessment: Review of patient past medical history, allergies, medications, and health status, including review of relevant consultants reports was performed today as part of a comprehensive evaluation and provision of chronic care management and care coordination services.     SDOH (Social Determinants of Health) assessments and interventions performed:    Advanced Directives Status: Not addressed in this encounter.  CCM Care Plan  No Known Allergies  Outpatient Encounter Medications as of 01/07/2022  Medication Sig   acetaminophen (TYLENOL) 325 MG tablet Take 1-2 tablets (325-650 mg total) by mouth every 4 (four) hours as needed for mild pain. (Patient not taking: Reported on 12/10/2021)   allopurinol (ZYLOPRIM) 100 MG tablet TAKE 1 TABLET(100 MG) BY MOUTH DAILY   aspirin EC 81 MG tablet Take 1 tablet (81 mg total) by mouth daily. (Patient taking differently: Take 81 mg by mouth in the morning.)   atorvastatin (LIPITOR) 40 MG tablet Take 1 tablet (40 mg total) by mouth daily.   citalopram (CELEXA) 20 MG tablet Take  1 tablet (20 mg total) by mouth daily.   colchicine 0.6 MG tablet TAKE 1 TABLET BY MOUTH ONCE DAILY   diclofenac Sodium (VOLTAREN) 1 % GEL Apply 2 g topically 4 (four) times daily as needed (pain).   Ferrous Sulfate (IRON) 325 (65 Fe) MG TABS Take 1 tablet (325 mg total) by mouth 2 (two) times daily before a meal. (Patient taking differently: Take 325 mg by mouth daily.)   furosemide (LASIX) 20 MG tablet Take 1 tablet (20 mg total) by mouth in the morning.   gabapentin (NEURONTIN) 300 MG capsule TAKE 1 CAPSULE BY MOUTH THREE TIMES DAILY   hydrocerin (EUCERIN) CREA Apply 1 application. topically 2 (two) times daily.   levETIRAcetam (KEPPRA) 500 MG tablet Take 1 tablet (500 mg total) by mouth 2 (two) times daily.   lisinopril-hydrochlorothiazide (ZESTORETIC) 10-12.5 MG tablet TAKE 1 TABLET BY MOUTH ONCE DAILY   Multiple Vitamin (MULTIVITAMIN WITH MINERALS) TABS tablet Take 1 tablet by mouth daily. (Patient taking differently: Take 1 tablet by mouth daily at 12 noon.)   pantoprazole (PROTONIX) 40 MG tablet Take 1 tablet (40 mg total) by mouth 2 (two) times daily. TAKE 1 TABLET(40 MG) BY MOUTH TWICE DAILY   phenytoin (DILANTIN) 100 MG ER capsule Take 1 capsule (100 mg total) by mouth 3 (three) times daily.   QUEtiapine (SEROQUEL) 50 MG tablet Take 1 tablet (50 mg total) by mouth at bedtime.   sucralfate (CARAFATE) 1 g tablet TAKE 1 TABLET(1 GRAM) BY MOUTH FOUR TIMES DAILY WITH MEALS AND AT BEDTIME   tamsulosin (FLOMAX) 0.4 MG CAPS capsule Take 1 capsule (0.4 mg  total) by mouth daily after supper.   No facility-administered encounter medications on file as of 01/07/2022.    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 10/02/2020   Endotracheally intubated 09/16/2020   Community acquired pneumonia 09/16/2020   Cervical spondylosis 09/16/2020   Vertebral artery stenosis 09/16/2020   Status epilepticus (Andres White) 09/16/2020   CKD (chronic kidney disease) stage 3, GFR 30-59 ml/min (Andres White) 09/16/2020   Occult blood in stools    Leukocytosis    Hyponatremia    History of hypertension    Dysphagia, post-stroke    Dysesthesia    Neuropathic pain    Brainstem infarct, acute (Andres White)    Acute blood loss anemia    AKI (acute kidney injury) (Andres White)    Stroke (cerebrum) (Andres White) 06/27/2020   Right leg weakness    Left carotid stenosis 04/13/2014    Conditions to be addressed/monitored: Stage III Chronic Kidney Disease, Stroke, and Neuropathic Pain. Limited Social Support, Limited Access to Andres White, Level of Care Concerns, ADL/IADL Limitations, Social Isolation, Limited Access to Building control surveyor, and Andres White.  Care Plan : LCSW Plan of Care  Updates made by Andres Gaines, LCSW since 01/07/2022 12:00 AM     Problem: Find Help in My Community. Resolved 01/07/2022  Priority: High     Goal: Find Help in My Community. Completed 01/07/2022  Start Date: 12/10/2021  Expected End Date: 01/07/2022  This Visit's Progress: On track  Recent Progress: On track  Priority: High  Note:   Current Barriers:   Patient with Neuropathic Pain, Acute Kidney Injury, Stage III Chronic Kidney Disease, Acute Brainstem Infarct, Cerebrum Stroke, and History of Seizure Disorder needs Support, Education, Resources, Referrals, Advocacy, and Care Coordination, to resolve unmet personal care needs in the home. Patient is unable to self-administer medications as prescribed, or consistently perform ADL's/IADL's independently. Patient lacks knowledge of available community agencies and resources. Clinical Goals:  Patient will have in-home care services in place, through a private agency of choice, from the  list provided.   Patient will have Meals-on-Wheels in place, through Andres White. Patient will attend an Adult Day Care Program, either through [facility] [place] [facility]) or [facility] ([place] [facility]). Patient's daughter will work with LCSW, and [facility], in an effort to obtain in-home care services, Meals-on-Wheels, and enroll in an Adult Day Care Program of choice.   Patient will demonstrate improved health management independence, as evidenced by having in-home care services, Meals-on-Wheels, and Adult Day Care Program services in place. Interventions: Collaboration with Primary Care Provider, Family Nurse Practitioner, Andres White regarding development and update of comprehensive plan of care, as evidenced by provider attestation and co-signature. Inter-disciplinary care team collaboration (see longitudinal plan of care). Clinical Interventions: Problem Solving/Task Centered Solutions Developed. Caregiver Stress Acknowledged. Consideration of In-Home Care Services Encouraged. Verbalization of Feelings Discussed. Patient Goals/Self-Care Activities:  Daughter will continue to work with [facility], in an effort to establish in-home care services, Meals-on-Wheels, and Adult Day Care Program services for you.    Daughter will continue contacting the following list of agencies, to check pricing, availability, flexibility, credentialing, etc., in an effort to  establish services:      ~ [place] ~ [place] ~ [place] Providers Daughter will review the following list of brochures, to become more knowledgeable about resources, services, and activities offered to seniors in your community, and consider enrollment: ~ Senior Resources of [employer] ([place]) ~ [employer] ~ Adult Day Care Programs Daughter  encouraged to contact LCSW [phone]# [phone]) directly, if she has questions, needs assistance, or if additional social work needs are identified in the near future.   No Follow-Up Required.     [place] Licensed Clinical Social Worker [facility] [phone]

## 2022-01-07 NOTE — Telephone Encounter (Signed)
Has anyone been able to contact patient to follow up on this? Thank you.

## 2022-01-07 NOTE — Telephone Encounter (Signed)
As he has not been seen in almost 1 year, will refill Dilantin for '100mg'$  3 times daily.  Patient will need follow-up visit for any additional refills and we will discuss tapering medication at follow-up visit. Thank you.

## 2022-01-07 NOTE — Telephone Encounter (Signed)
Called daughter [name], and she stated patient is taking Dilantin 3 x a day. His pills are prepacked and shipped to him from [employer]. She checked his bottle and last Rx was sent by Dr Gaynelle Adu, former PCP. She is unsure why he refilled it.  I reviewed NP's Dilantin taper schedule with her that was given last visit July 2022. [name] stated she did not recall this. She was just giving him meds as they arrived prepacked. I informed her I'll let NP know of his current dose. Advised Levetiracetam will be refilled. She  verbalized understanding, appreciation.

## 2022-01-13 ENCOUNTER — Telehealth: Payer: Self-pay

## 2022-01-13 NOTE — Telephone Encounter (Signed)
   Telephone encounter was:  Successful.  01/13/2022 Name: Andres White MRN: [id number] DOB: [birth year]  Andres White is a 76 y.o. year old male who is a primary care patient of Marrian Salvage, [place] . The community resource team was consulted for assistance with Food Insecurity, Caregiver Stress, and  Medical equipment and supplies and Senior Adult [place] guide performed the following interventions: Follow up call placed to the patient to discuss status of referral. Per Daughter, resources have been received by mail and at this time, there are no further questions, concerns, or need as daughter has all requested information.   Follow Up Plan:  No further follow up planned at this time. The patient has been provided with needed resources.  [place] management  [place], [place]  Main Phone: [phone]  E-mail: [email or web address]  Website: [email or web address]

## 2022-01-20 ENCOUNTER — Ambulatory Visit: Payer: Medicare Other | Admitting: Adult Health

## 2022-01-20 ENCOUNTER — Encounter: Payer: Self-pay | Admitting: Adult Health

## 2022-01-20 VITALS — BP 150/82 | HR 62 | Ht 67.0 in | Wt 182.0 lb

## 2022-01-20 DIAGNOSIS — I69398 Other sequelae of cerebral infarction: Secondary | ICD-10-CM | POA: Diagnosis not present

## 2022-01-20 DIAGNOSIS — F0150 Vascular dementia without behavioral disturbance: Secondary | ICD-10-CM | POA: Diagnosis not present

## 2022-01-20 DIAGNOSIS — R569 Unspecified convulsions: Secondary | ICD-10-CM | POA: Diagnosis not present

## 2022-01-20 DIAGNOSIS — I63031 Cerebral infarction due to thrombosis of right carotid artery: Secondary | ICD-10-CM

## 2022-01-20 MED ORDER — PHENYTOIN SODIUM EXTENDED 100 MG PO CAPS: ORAL_CAPSULE | ORAL | 0 refills | Status: DC

## 2022-01-20 NOTE — Patient Instructions (Addendum)
Decrease Dilantin to '100mg'$  twice daily for 30 days then decrease to '100mg'$  once daily for 30 days then stop We will plan on repeating an EEG in approx 3 months to ensure you are not having any "silent" seizures once we stop Dilantin Continue keppra at current dosage   Continue aspirin 81 mg daily  and atorvastatin  for secondary stroke prevention  Continue to follow up with PCP regarding cholesterol and blood pressure management  Maintain strict control of hypertension with blood pressure goal below 130/90, diabetes with hemoglobin A1c goal below 7.0 % and cholesterol with LDL cholesterol (bad cholesterol) goal below 70 mg/dL.   Signs of a Stroke? Follow the BEFAST method:  Balance Watch for a sudden loss of balance, trouble with coordination or vertigo Eyes Is there a sudden loss of vision in one or both eyes? Or double vision?  Face: Ask the person to smile. Does one side of the face droop or is it numb?  Arms: Ask the person to raise both arms. Does one arm drift downward? Is there weakness or numbness of a leg? Speech: Ask the person to repeat a simple phrase. Does the speech sound slurred/strange? Is the person confused ? Time: If you observe any of these signs, call 911.  [facility] to be set up for a evaluation of elevated PSA and urinary frequency  [place], [place], [place] [address] Phone: [phone]     Followup in the future with me in 6 months or call earlier if needed       Thank you for coming to see [place] at [facility]. I hope we have been able to provide you high quality care today.  You may receive a patient satisfaction survey over the next few weeks. We would appreciate your feedback and comments so that we may continue to improve ourselves and the health of our patients.

## 2022-01-20 NOTE — Progress Notes (Signed)
[facility] [address] [place]. [place] [address] [phone]       OFFICE FOLLOW UP VISIT NOTE  Andres White Date of Birth:  04-10-1946 Medical Record Number:  [id number]   Referring MD: Reesa Chew, PA-C Reason for Referral: Stroke    Chief Complaint  Patient presents with   Follow-up    RM 3 with daughter Andres White  Pt is well, no sz since last visits, no new stroke concerns and daughter states memory/cognition has been stable      HPI:   Update 01/20/2022 JM: Patient returns for stroke, seizure and dementia follow-up after prior visit almost 1 year ago (requested 27-monthfollow-up).  He is accompanied by his daughter who provides majority of history.  Stable from stroke standpoint, denies new stroke/TIA symptoms.  Residual right-sided weakness and cognitive impairment stable. Will use RW at all times, no recent falls.  Compliant on aspirin and atorvastatin, denies side effects.  Blood pressure today 150/82.   Cognition has been stable since prior visit.  MMSE today 11/30 (prior 11/30).  Continues to live with his daughter who provides 24/7 supervision and assistance for majority of ADLs.  Recent lab work with PCP showed low B12 at 176 and was started on oral B12 supplement.  Denies any seizure activity.  Discussed weaning off of Dilantin at prior visit but he has since remained on both Keppra and Dilantin due to miscommunication.  Denies side effects on medications.  He has since establish care with new PCP, he was referred to urologist due to elevated PSA.  Daughter also concerned regarding frequent urination, questions if this is due to furosemide.  This is currently being managed by cardiology which was started back in 10/2020 for edema.  Daughter was encouraged to ensure evaluation with urology scheduled for further discussion as well as with cardiology for furosemide management  No further concerns at this time     History provided for  reference purposes only Update 02/25/2021 JM: Mr. SMadayreturns for 319-monthtroke and seizure follow-up accompanied by his daughter who provides history.  He has been stable since prior visit without new stroke/TIA symptoms or seizure activity.  Reports residual right sided weakness and cognitive impairment- [facility] but stopped mid Spring due to short staffing.  He does try to stay active at home but not necessarily any type of routine physical or mental exercises.  He ambulates with rolling walker - did have 1 fall when he was not using walker thankfully without injury.  He continues to live with his daughter who provides 24/7 supervision as well as assistance for ADLs.  Cognition has been generally stable but fluctuates per daughter.  On gabapentin 300 mg TID per PMR Dr. RaRanell Patrickor neuropathy pain.  Compliant on aspirin and atorvastatin without associated side effects.  Blood pressure today 151/87.  Compliant on Keppra and Dilantin tolerating without side effects.  No further concerns at this time.  Update 11/08/2020 Dr. SeLeonie ManHe returns for follow-up after last visit 3 months ago.  Patient is referred back to see me because of new problems of seizures which [facility]  Patient will at home when family witnessed that he was not acting his usual self and was gazing upwards.  When EMS arrived they noticed a left gaze deviation and unresponsiveness to voice and following commands and possibly some left-sided weakness.  Patient was initially brought in as a code stroke but while  being evaluated was noticed is likely having a seizure because he had tonic gaze deviation with a cry and stiffening of the whole body followed by generalized shaking which lasted about a minute.  He was given IV Ativan which stopped the episode.  He was also loaded with IV Keppra.  CT angiogram of the head and neck showed calcified plaque at both carotid bifurcation with  70% proximal left ICA stenosis and 50% stenosis at the origin of the nondominant right vertebral.  EEG done on 09/17/2020 and 09/18/2020 showed moderate diffuse encephalopathy there were several episodes of even better being pushed by the staff when they noticed tremulous movements on the right but these did not have any electrographic correlates for seizures.  Patient was intubated and IV phenytoin was added to the Keppra with the got better and self extubated himself.  He finished long-term EEG monitoring which showed no definite seizure activity.  MRI scan of the brain on 09/20/2020 was motion degraded but showed no acute abnormality.  Showed evidence of old infarcts bilaterally.  Patient did have some cognitive impairment following his previous stroke when the daughter feels that this has gotten worse now.  He requires constant supervision.  He cannot be left alone.  He is able to walk with a walker but he is disoriented at baseline and requires help with most activities of daily living.  He has had no recurrent seizures of focal jerking since his being home.  He is still on the current dosages of Keppra finder milligram twice daily and phenytoin 100 mg   3 times a day.  Is had no recurrent stroke or TIA symptoms and remains on aspirin she is tolerating well.  Blood pressure is well controlled today it is slightly elevated 160/82.  Is tolerating Lipitor well without muscle aches and pains.  Initial visit 08/30/2020 Dr. Leonie Man: Andres White is a 76 year old African-American male seen today for initial office consultation visit for stroke.  Is accompanied by his daughter who provides history.  I also reviewed electronic medical records and imaging films in PACS.  He has a past medical history of hypertension, tobacco abuse, arthritis who presented to [facility] with right leg weakness for 4 days prior to admission.  He was found to have mild right hand and more leg weakness.  MRI  scan of the brain showed a left midbrain lacunar infarct.  Carotid ultrasound suggested moderate 70 to 90% left ICA stenosis which is asymptomatic.  Echocardiogram showed normal ejection fraction without cardiac source of embolism.  LDL cholesterol was 32 mg percent.  Hemoglobin A1c was 4.8.  Lower extremity venous Dopplers were negative.  Patient subsequently had 2 weeks external cardiac monitor done which was negative for paroxysmal A. fib.  Patient was started on aspirin 81 mg daily which he is tolerating well without bruising or bleeding.  He went to inpatient rehab for 4 weeks and subsequently is at home living with her daughter.  Is able to ambulate independently now though he still has some diminished fine motor skills and mild right leg dragging.  His speech is improved.  He however has significant memory loss and cognitive impairment following his stroke as per the daughter.  He is quite forgetful and cannot remember recent conversations and needs constant reminders.  He was previously living independently and managing his own affairs but daughter feels he is no longer able to do so.  Patient states he is cut back smoking significantly but still  smokes a few cigarettes a day.  His blood pressure is well controlled and today it is 142/86.  There is no family history of dementia.  Patient has no prior history of seizures or significant neurological problems.   ROS:   14 system review of systems is positive for those listed in HPI and all other systems negative.     PMH:  Past Medical History:  Diagnosis Date   Arthritis    Chronic back pain    Duodenal adenoma    Gout    Hypertension    Stroke ([place]) 06/2020   Syncope and collapse     Social History:  Social History   Socioeconomic History   Marital status: Single    Spouse name: Not on file   Number of children: 1   Years of education: 12   Highest education level: 12th grade  Occupational History   Not on file  Tobacco Use    Smoking status: Every Day    Packs/day: 1.00    Years: 20.00    Total pack years: 20.00    Types: Cigarettes    Passive exposure: Current   Smokeless tobacco: Never   Tobacco comments:    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, drilling  Vaping Use   Vaping Use: Never used  Substance and Sexual Activity   Alcohol use: Not Currently    Comment: occasional   Drug use: No   Sexual activity: Not Currently  Other Topics Concern   Not on file  Social History Narrative   Lives with daughter, son in law and 2 grandchildren   Right Handed   Drinks 1 cup caffeine 4 times a week-ish   Social Determinants of Health   Financial Resource Strain: Low Risk  (12/11/2021)   Overall Financial Resource Strain (CARDIA)    Difficulty of Paying Living Expenses: Not hard at all  Food Insecurity: No Food Insecurity (12/11/2021)   Hunger Vital Sign    Worried About Running Out of Food in the Last Year: Never true    Ran Out of Food in the Last Year: Never true  Transportation Needs: No Transportation Needs (12/11/2021)   PRAPARE - Hydrologist (Medical): No    Lack of Transportation (Non-Medical): No  Physical Activity: Inactive (12/11/2021)   Exercise Vital Sign    Days of Exercise per Week: 0 days    Minutes of Exercise per Session: 0 min  Stress: No Stress Concern Present (12/11/2021)   [place]    Feeling of Stress : Only a little  Social Connections: Unknown (12/11/2021)   Social Connection and Isolation Panel [NHANES]    Frequency of Communication with Friends and Family: More than three times a week    Frequency of Social Gatherings with Friends and Family: More than three times a week    Attends Religious Services: Never    Marine scientist or Organizations: No    Attends Archivist Meetings: Never    Marital Status: Not on file  Intimate Partner Violence: Not At Risk (12/11/2021)    Humiliation, Afraid, Rape, and Kick questionnaire    Fear of Current or Ex-Partner: No    Emotionally Abused: No    Physically Abused: No    Sexually Abused: No    Medications:   Current Outpatient Medications on File Prior to Visit  Medication Sig Dispense Refill   acetaminophen (TYLENOL) 325 MG tablet Take 1-2  tablets (325-650 mg total) by mouth every 4 (four) hours as needed for mild pain.     allopurinol (ZYLOPRIM) 100 MG tablet TAKE 1 TABLET(100 MG) BY MOUTH DAILY 90 tablet 0   aspirin EC 81 MG tablet Take 1 tablet (81 mg total) by mouth daily. (Patient taking differently: Take 81 mg by mouth in the morning.) 90 tablet 3   atorvastatin (LIPITOR) 40 MG tablet Take 1 tablet (40 mg total) by mouth daily. 90 tablet 1   citalopram (CELEXA) 20 MG tablet Take 1 tablet (20 mg total) by mouth daily. 90 tablet 1   colchicine 0.6 MG tablet TAKE 1 TABLET BY MOUTH ONCE DAILY 30 tablet 10   diclofenac Sodium (VOLTAREN) 1 % GEL Apply 2 g topically 4 (four) times daily as needed (pain). 100 g 2   Ferrous Sulfate (IRON) 325 (65 Fe) MG TABS Take 1 tablet (325 mg total) by mouth 2 (two) times daily before a meal. (Patient taking differently: Take 325 mg by mouth daily.) 360 tablet 0   furosemide (LASIX) 20 MG tablet Take 1 tablet (20 mg total) by mouth in the morning. 30 tablet 10   gabapentin (NEURONTIN) 300 MG capsule TAKE 1 CAPSULE BY MOUTH THREE TIMES DAILY 90 capsule 10   hydrocerin (EUCERIN) CREA Apply 1 application. topically 2 (two) times daily. 454 g 1   levETIRAcetam (KEPPRA) 500 MG tablet Take 1 tablet (500 mg total) by mouth 2 (two) times daily. 180 tablet 3   lisinopril-hydrochlorothiazide (ZESTORETIC) 10-12.5 MG tablet TAKE 1 TABLET BY MOUTH ONCE DAILY 30 tablet 10   Multiple Vitamin (MULTIVITAMIN WITH MINERALS) TABS tablet Take 1 tablet by mouth daily. (Patient taking differently: Take 1 tablet by mouth daily at 12 noon.)     pantoprazole (PROTONIX) 40 MG tablet Take 1 tablet (40 mg total)  by mouth 2 (two) times daily. TAKE 1 TABLET(40 MG) BY MOUTH TWICE DAILY 180 tablet 1   phenytoin (DILANTIN) 100 MG ER capsule Take 1 capsule (100 mg total) by mouth 3 (three) times daily. 90 capsule 5   QUEtiapine (SEROQUEL) 50 MG tablet Take 1 tablet (50 mg total) by mouth at bedtime. 90 tablet 3   sucralfate (CARAFATE) 1 g tablet TAKE 1 TABLET(1 GRAM) BY MOUTH FOUR TIMES DAILY WITH MEALS AND AT BEDTIME 368 tablet 3   tamsulosin (FLOMAX) 0.4 MG CAPS capsule Take 1 capsule (0.4 mg total) by mouth daily after supper. 90 capsule 1   No current facility-administered medications on file prior to visit.    Allergies:  No Known Allergies  Physical Exam Today's Vitals   01/20/22 1058  BP: (!) 150/82  Pulse: 62  Weight: 182 lb (82.6 kg)  Height: '5\' 7"'$  (1.702 m)    Body mass index is 28.51 kg/m.   General: Frail very pleasant elderly African-American male, seated, in no evident distress Head: head normocephalic and atraumatic.   Neck: supple with no carotid or supraclavicular bruits Cardiovascular: regular rate and rhythm, no murmurs Musculoskeletal: no deformity Skin:  no rash/petichiae; 1+ BLE edema Vascular:  Normal pulses all extremities  Neurologic Exam Mental Status: Awake and fully alert.  Unable to appreciate dysarthria or aphasia.  Able to follow commands without difficulty.    01/20/2022   11:04 AM 02/25/2021    8:43 AM 08/30/2020   10:38 AM  MMSE - Mini Mental State Exam  Orientation to time 0 1 2  Orientation to Place '3 3 3  '$ Registration '3 3 3  '$ Attention/ Calculation 0  0 0  Recall '1 3 1  '$ Language- name 2 objects '1 1 2  '$ Language- repeat 0 0 0  Language- follow 3 step command 3 0 3  Language- read & follow direction 0 0 0  Write a sentence 0 0 0  Copy design 0 0 0  Total score '11 11 14   '$ Cranial Nerves: Pupils equal, briskly reactive to light. Extraocular movements full without nystagmus. Visual fields full to confrontation. Hearing intact. Facial sensation intact.  Face, tongue, palate moves normally and symmetrically.  Motor: Normal bulk and tone. Normal strength in all tested extremity muscles except mild right grip weakness and diminished fine finger movements on the right and orbits left over right upper extremity and right ankle dorsiflexion and plantarflexion weakness Sensory.: intact to touch , pinprick , position and vibratory sensation.  Coordination: Rapid alternating movements normal on leftside. Finger-to-nose and heel-to-shin performed accurately on left side with incoordination right upper and lower extremity Gait and Station: Arises from chair with difficulty. Stance is stooped.  Using a walker.  Gait demonstrates slight dragging of the right leg and unsteadiness.  Tandem walk and heel toe not attempted Reflexes: 1+ and symmetric. Toes downgoing.        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  He has post stroke residual mild right hemiparesis as well as dementia and memory loss following his stroke which appears to have worsened after recent admission for seizures in February 2022.     Left lacunar infarct:  Residual deficit: Right hemiparesis and cognitive impairment. Stable. Discussed use of RW at all times for fall prevention. Continue aspirin 81 mg daily  and atorvastatin for secondary stroke prevention.  Close PCP f/u for aggressive stroke risk factor management including BP goal<130/90, and HLD with LDL goal<70  Vascular dementia: MMSE 11/30 (prior 11/30). Discussed importance of routine memory exercises at home as well as routine physical exercise (as tolerated), healthy diet, adequate sleep and management of risk factors.  Daughter continues to provide 24/7 supervision and care  Seizures:  Stable without new seizure activity.   Continue Keppra 500 mg twice daily  per Dr.  Clydene Fake recommendations, will plan on slowly decreasing Dilantin dosage as he has been stable - recommend decreasing dosage from 100 mg 3 times daily to 100 mg twice daily for 30 days then 100 mg daily for 30 days then discontinue.  Will plan on repeating EEG around 04/2022 Discussed possible seizures with weaning off from Dilantin and signs/symptoms to observe for and advised to call office with any potential seizure activity    Follow-up in 6 months or call earlier if needed   CC:  Marrian Salvage, FNP   I spent 38 minutes of face-to-face and non-face-to-face time with patient and daughter.  This included previsit chart review, lab review, study review, order entry, electronic health record documentation, patient and daughter education and discussion regarding history of prior stroke as well as secondary stroke prevention measures and aggressive stroke risk factor management, residual deficits, seizures and use of AED as well as discontinuing Dilantin, vascular dementia with review and discussion of MMSE and answered all other questions to patient and daughters satisfaction  Frann Rider, [facility]  [facility] [address] [place], [place] [address]  Phone [phone] Fax [phone] Note: This document was prepared with digital dictation and possible smart phrase technology. Any transcriptional errors  that result from this process are unintentional.

## 2022-01-31 ENCOUNTER — Other Ambulatory Visit: Payer: Self-pay | Admitting: Family Medicine

## 2022-01-31 DIAGNOSIS — N179 Acute kidney failure, unspecified: Secondary | ICD-10-CM

## 2022-01-31 DIAGNOSIS — I636 Cerebral infarction due to cerebral venous thrombosis, nonpyogenic: Secondary | ICD-10-CM

## 2022-01-31 DIAGNOSIS — N1831 Chronic kidney disease, stage 3a: Secondary | ICD-10-CM

## 2022-01-31 DIAGNOSIS — G40901 Epilepsy, unspecified, not intractable, with status epilepticus: Secondary | ICD-10-CM

## 2022-02-17 ENCOUNTER — Encounter: Payer: Self-pay | Admitting: Physical Medicine and Rehabilitation

## 2022-02-17 ENCOUNTER — Encounter
Payer: Medicare Other | Attending: Physical Medicine and Rehabilitation | Admitting: Physical Medicine and Rehabilitation

## 2022-02-17 VITALS — BP 119/72 | Ht 67.0 in | Wt 182.0 lb

## 2022-02-17 DIAGNOSIS — F17200 Nicotine dependence, unspecified, uncomplicated: Secondary | ICD-10-CM

## 2022-02-17 DIAGNOSIS — D509 Iron deficiency anemia, unspecified: Secondary | ICD-10-CM

## 2022-02-17 DIAGNOSIS — I739 Peripheral vascular disease, unspecified: Secondary | ICD-10-CM | POA: Diagnosis not present

## 2022-02-17 MED ORDER — CITALOPRAM HYDROBROMIDE 10 MG PO TABS
10.0000 mg | ORAL_TABLET | Freq: Every day | ORAL | 3 refills | Status: DC
Start: 2022-02-17 — End: 2022-04-15

## 2022-02-17 MED ORDER — QUETIAPINE FUMARATE 50 MG PO TABS: 50.0000 mg | ORAL_TABLET | Freq: Every day | ORAL | 3 refills | Status: DC

## 2022-02-17 MED ORDER — CILOSTAZOL 50 MG PO TABS: 50.0000 mg | ORAL_TABLET | Freq: Two times a day (BID) | ORAL | 3 refills | Status: DC

## 2022-02-17 MED ORDER — NALTREXONE HCL 50 MG PO TABS: 25.0000 mg | ORAL_TABLET | Freq: Every day | ORAL | 0 refills | Status: DC

## 2022-02-17 MED ORDER — TAMSULOSIN HCL 0.4 MG PO CAPS: 0.8000 mg | ORAL_CAPSULE | Freq: Every day | ORAL | 1 refills | Status: DC

## 2022-02-17 NOTE — Progress Notes (Unsigned)
Subjective:    Patient ID: Andres White, male    DOB: 02/05/1946, 76 y.o.   MRN: [id number]  HPI: Andres White is a 76 year old man who presents to for follow-up of right knee and lower leg pain. Started 3-4 months ago. Has had decreased sensation in right foot. Last visit was with vascular last year and their visits are once a year.   1) Acute brainstem infarct, sequelae: - Andres White is a 76 y.o. male who is here for f/u appointment of his Brainstem Infarct acute, Right Leg weakness, occult blood in stools,  and left carotid stenosis. Andres White went to [facility] on 06/24/2020 for right leg weakness. Neurology consulted.   -CT Head WO Contrast:  IMPRESSION: No acute intracranial hemorrhage or mass effect. Multiple chronic infarcts and moderate chronic microvascular ischemic changes. Age indeterminate though probably chronic infarct of the left thalamus. MR Brain WO Contrast:  IMPRESSION: Small acute infarcts of the left midbrain.   Several chronic infarcts as described. Moderate chronic microvascular ischemic changes.  US Carotid Bilateral:  IMPRESSION: Right:   Color duplex indicates moderate heterogeneous and calcified plaque, with no hemodynamically significant stenosis by duplex criteria in the extracranial cerebrovascular circulation.   Left:   Heterogeneous and partially calcified plaque at the left carotid bifurcation, with discordant results regarding degree of stenosis by established duplex criteria. Peak velocity suggests 70% - 99% stenosis, with the ICA/ CCA ratio suggesting a lesser degree of stenosis. If establishing a more accurate degree of stenosis is required, cerebral angiogram should be considered, or as a second best test, CTA. Per Discharge Summary: Reesa Chew PA-C: Neurology recommended aspirin and statin for secondary stroke prevention.  Cardiology was consulted , he had bouts of asymptomatic NSVT, cardiology following as outpatient.   [name] was admitted to inpatient rehabilitation on 06/27/2020 and he was discharged to daughter's home on 07/18/2020. He's receiving outpatient therapy at [place]. He denies any pain at this time. He rated his pain on his Health and History 3. Also reports he has a good appetite.  Daughter in room, all questions answered.  Andres White Andres White daughter has established PCP care for him in [place].   Current mobility: Able to ambulated very limited distances due to his pain  2) Left carotid stenosis -Has followed up with cardiology.   4) Occult blood in stool -Hgb 10.6 on 3/23. Discussed with patient and daughter.   5) Intermittent, tingling, and aching pain, neuropathic: -increase Gabapentin at night  6) Hypertension -much better controlled! -but lisinopril causes cough  7) Overweight: weight 183- lost 5 lbs!  8) Gout: currently having a flare with 10/10 pain- taking one of colchicine daily without benefit. Does not drink alcohol. Eats meat.  9) Active smoker: Smokes 15 cigarettes per day. Would like to stop. He thinks he can go down to 13 cigarettes per day. He has not had benefit from nicotine patch in the past.    Pain Inventory Average Pain 5 Pain Right Now 2 My pain is intermittent, dull, tingling, and aching  LOCATION OF PAIN  right leg   Pain worst in the morning, evening  Sleep is fair  Pain worsen with walking, standing, inactivity, standing, some activities   Pian improves with rest, medication  Ibuprofen gives fair relief  BOWEL Number of stools per week: 2 Oral laxative use No  Type of laxative None Enema or suppository use No  History of colostomy No  Incontinent No  BLADDER Normal and Pads In and out cath, frequency N/A Able to self cath No  Bladder incontinence Yes  Frequent urination Yes  Leakage with coughing No  Difficulty starting stream No  Incomplete bladder emptying Yes    Mobility walk with assistance use a  walker how many minutes can you walk? 3 MINS ability to climb steps?  yes do you drive?  no use a wheelchair Do you have any goals in this area?  yes  Function retired I need assistance with the following:  feeding, dressing, bathing, toileting, meal prep, household duties and shopping Do you have any goals in this area?  yes  Neuro/Psych bladder control problems bowel control problems weakness numbness trouble walking confusion anxiety  Prior Studies Any changes since last visit?  no  Physicians involved in your care Any changes since last visit?  no   Family History  Problem Relation Age of Onset   Hypertension Mother    Colon cancer Neg Hx    Pancreatic cancer Neg Hx    Stomach cancer Neg Hx    Esophageal cancer Neg Hx    Inflammatory bowel disease Neg Hx    Liver disease Neg Hx    Rectal cancer Neg Hx    Social History   Socioeconomic History   Marital status: Single    Spouse name: Not on file   Number of children: 1   Years of education: 12   Highest education level: 12th grade  Occupational History   Not on file  Tobacco Use   Smoking status: Every Day    Packs/day: 1.00    Years: 20.00    Total pack years: 20.00    Types: Cigarettes    Passive exposure: Current   Smokeless tobacco: Never   Tobacco comments:    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, drilling  Vaping Use   Vaping Use: Never used  Substance and Sexual Activity   Alcohol use: Not Currently    Comment: occasional   Drug use: No   Sexual activity: Not Currently  Other Topics Concern   Not on file  Social History Narrative   Lives with daughter, son in law and 2 grandchildren   Right Handed   Drinks 1 cup caffeine 4 times a week-ish   Social Determinants of Health   Financial Resource Strain: Low Risk  (12/11/2021)   Overall Financial Resource Strain (CARDIA)    Difficulty of Paying Living Expenses: Not hard at all  Food Insecurity: No Food Insecurity (12/11/2021)   Hunger Vital  Sign    Worried About Running Out of Food in the Last Year: Never true    Ran Out of Food in the Last Year: Never true  Transportation Needs: No Transportation Needs (12/11/2021)   PRAPARE - Hydrologist (Medical): No    Lack of Transportation (Non-Medical): No  Physical Activity: Inactive (12/11/2021)   Exercise Vital Sign    Days of Exercise per Week: 0 days    Minutes of Exercise per Session: 0 min  Stress: No Stress Concern Present (12/11/2021)   [place]    Feeling of Stress : Only a little  Social Connections: Unknown (12/11/2021)   Social Connection and Isolation Panel [NHANES]    Frequency of Communication with Friends and Family: More than three times a week    Frequency of Social Gatherings with Friends and Family: More than three times a week  Attends Religious Services: Never    Active Member of Clubs or Organizations: No    Attends Archivist Meetings: Never    Marital Status: Not on file   Past Surgical History:  Procedure Laterality Date   BIOPSY  07/13/2020   Procedure: BIOPSY;  Surgeon: Ladene Artist, MD;  Location: [place];  Service: Endoscopy;;   BIOPSY  11/28/2020   Procedure: BIOPSY;  Surgeon: Irving Copas., MD;  Location: [facility];  Service: Gastroenterology;;   CARDIAC CATHETERIZATION  2015   [facility]    COLONOSCOPY WITH PROPOFOL N/A 07/13/2020   Procedure: COLONOSCOPY WITH PROPOFOL;  Surgeon: Ladene Artist, MD;  Location: [facility] ENDOSCOPY;  Service: Endoscopy;  Laterality: N/A;   COLONOSCOPY WITH PROPOFOL N/A 11/28/2020   Procedure: COLONOSCOPY WITH PROPOFOL;  Surgeon: Rush Landmark Telford Nab., MD;  Location: [facility] ENDOSCOPY;  Service: Gastroenterology;  Laterality: N/A;   ENDOSCOPIC MUCOSAL RESECTION N/A 11/28/2020   Procedure: ENDOSCOPIC MUCOSAL RESECTION;  Surgeon: Rush Landmark Telford Nab., MD;  Location: [facility] END[facility];  Service:  Gastroenterology;  Laterality: N/A;   ESOPHAGOGASTRODUODENOSCOPY N/A 07/13/2020   Procedure: ESOPHAGOGASTRODUODENOSCOPY (EGD);  Surgeon: Ladene Artist, MD;  Location[facility];  Service: Endoscopy;  Laterality: N/A;   ESOPHAGOGASTRODUODENOSCOPY (EGD) WITH PROPOFOL N/A 11/28/2020   Procedure: ESOPHAGOGASTRODUODENOSCOPY (EGD) WITH PROPOFOL;  Surgeon: Rush Landmark Telford Nab., MD;  Location: [facility];  Service: Gastroenterology;  Laterality: N/A;   HEMOSTASIS CLIP PLACEMENT  11/28/2020   Procedure: HEMOSTASIS CLIP PLACEMENT;  Surgeon: Irving Copas., MD;  Location: [facility];  Service: Gastroenterology;;   KNEE SURGERY Right    POLYPECTOMY  07/13/2020   Procedure: POLYPECTOMY;  Surgeon: Ladene Artist, MD;  Location: [place];  Service: Endoscopy;;   POLYPECTOMY  11/28/2020   Procedure: POLYPECTOMY;  Surgeon: Irving Copas., MD;  Location: [facility];  Service: Gastroenterology;;   SUBMUCOSAL LIFTING INJECTION  11/28/2020   Procedure: SUBMUCOSAL LIFTING INJECTION;  Surgeon: Irving Copas., MD;  Location: [facility];  Service: Gastroenterology;;   SUBMUCOSAL TATTOO INJECTION  11/28/2020   Procedure: SUBMUCOSAL TATTOO INJECTION;  Surgeon: Irving Copas., MD;  Location: [facility];  Service: Gastroenterology;;   Past Medical History:  Diagnosis Date   Arthritis    Chronic back pain    Duodenal adenoma    Gout    Hypertension    Stroke ([place]) 06/2020   Syncope and collapse    Ht '5\' 7"'$  (1.702 m)   Wt 182 lb (82.6 kg)   BMI 28.51 kg/m   Opioid Risk Score:   Fall Risk Score:  `1  Depression screen [facility] 2/9     12/11/2021    8:43 AM 09/14/2020    8:44 AM  Depression screen PHQ 2/9  Decreased Interest 1 2  Down, Depressed, Hopeless 0 0  PHQ - 2 Score 1 2  Altered sleeping  2  Tired, decreased energy  2  Change in appetite  0  Feeling bad or failure about yourself   1  Trouble concentrating  1  Moving slowly or fidgety/restless   1  Suicidal thoughts  0  PHQ-9 Score  9   Review of Systems  Constitutional: Negative.   Eyes: Negative.   Respiratory:  Positive for shortness of breath.   Cardiovascular: Negative.   Gastrointestinal:  Positive for constipation.  Endocrine: Negative.   Genitourinary:  Positive for frequency.  Musculoskeletal:  Positive for gait problem.       Right sided pain & weakness  Skin: Negative.   Allergic/Immunologic:  Negative.   Neurological:  Positive for tremors, weakness and numbness.  Hematological: Negative.   Psychiatric/Behavioral:  Positive for confusion.        Anxiety  All other systems reviewed and are negative.      Objective:    Physical Exam Gen: no distress, normal appearing HEENT: oral mucosa pink and moist, NCAT Cardio: Reg rate Chest: normal effort, normal rate of breathing Abd: soft, non-distended Ext: no edema Psych: pleasant, normal affect Musculoskeletal:     Cervical back: Normal range of motion and neck supple.     Comments: Normal Muscle Bulk and Muscle Testing Reveals:  Upper Extremities: Full ROM and Muscle Strength on Right 3/5 and Left 5/5 Lower Extremities: Full ROM and Muscle Strength 5/5 Arises from Table slowly using walker for support Narrow Based  Gait, antalgic with RW    Assessment & Plan:  1.Brainstem Infarct acute:Right Leg Weakness: Continue outpatient Therapy with [facility]. Neurology Following. Continue to monitor.  2.Occult Blood in Stools: Gastroenterology Following Dr Fuller Plan.  He's scheduled for Colonoscopy. Hgb reviewed and was 10.6 on 3/18. Continue to monitor.  3.Left carotid stenosis.Cardiology Following: Continue cardiology follow-up  4. Neuropathy: increase gabapentin to '300mg'$  TID 5. Right leg weakness: Provided with handicap placard  6.HTN: -tremendous improvement but cough with lisinopril: stop lisinopril and start amlodipine '10mg'$  daily and increase flomax to 0.'8mg'$ .  -Advised checking BP daily at home and  logging results to bring into follow-up appointment with her PCP and myself. -Advised regarding healthy foods that can help lower blood pressure and provided with a list: 1) citrus foods 2) salmon and other fatty fish 3) swiss chard (leafy green) 4) pumpkin seeds 5) Beans and lentils 6) Berries 7) Amaranth (whole grain, can be cooked similarly to rice and oats) 8) Pistachios 9) Carrots 10) Celery 11) Tomatoes 12) Broccoli 13) Greek yogurt 14) Herbs and spices: Celery seed, cilantro, saffron, lemongrass, black cumin, ginseng, cinnamon, cardamom, sweet basil, and ginger 15) Chia and flax seeds 16) Beets 17) spinach -Educated that goal BP is 120/80. -Made goal to incorporate some of the above foods into her diet.   7. Overweight: lost 5 lbs! Commended on progreass -Discussed foods that can assist in weight loss:  1) Eggs  2) Leafy greens  3) Salmon  4) Cruciferous vegetables  5) Lean beef and chicken breast  6) Boiled potatoes  7) Tuna  8) Beans and legumes  9) Soups  10) Cottage cheese  11) Avocados  12) Apple cider vinegar  13) Nuts  14) Whole grains  15) Chili pepper  16) Fruit- berries are some of the best  17) Grapefruit  18) Chia seeds  19) Coconut oil  20) Full-fat yogurt  8. PAD: referred to vascular clinic -ordered Cilostozal  9. Iron deficiency anemia: -repeat iron level today  9. Active smoker: Pprescribed chantix. Discussed benefits of stopping smoking cold [place]. Discussed patch, but he did not have good benefit in the past. Can consider bupropion if chantix does not help. Made goal to decrease to stop cold [place] or at least decrease to 9 cigarettes, and to decrease by 1 cigarette each week. Counseled regarding risks of lung cancer, PAD, COPD, and in harming family members via secondhand smoke

## 2022-02-26 ENCOUNTER — Other Ambulatory Visit: Payer: Self-pay | Admitting: General Practice

## 2022-03-07 ENCOUNTER — Other Ambulatory Visit: Payer: Self-pay | Admitting: General Practice

## 2022-03-10 ENCOUNTER — Ambulatory Visit: Payer: Medicare Other

## 2022-03-10 ENCOUNTER — Other Ambulatory Visit: Payer: Self-pay | Admitting: Cardiovascular Disease

## 2022-03-10 NOTE — Progress Notes (Deleted)
Subjective:   Andres White is a 76 y.o. male who presents for an Initial Medicare Annual Wellness Visit.  I connected with Kingjames today by telephone and verified that I am speaking with the correct person using two identifiers. Location patient: home Location provider: work Persons participating in the virtual visit: patient, Marine scientist.    I discussed the limitations, risks, security and privacy concerns of performing an evaluation and management service by telephone and the availability of in person appointments. I also discussed with the patient that there may be a patient responsible charge related to this service. The patient expressed understanding and verbally consented to this telephonic visit.    Interactive audio and video telecommunications were attempted between this provider and patient, however failed, due to patient having technical difficulties OR patient did not have access to video capability.  We continued and completed visit with audio only.  Some vital signs may be absent or patient reported.   Time Spent with patient on telephone encounter: *** minutes   Review of Systems    ***       Objective:    There were no vitals filed for this visit. There is no height or weight on file to calculate BMI.     12/11/2021    8:49 AM 03/05/2021    9:33 AM 11/28/2020    6:19 AM 09/17/2020   10:00 AM 07/13/2020    8:43 AM 06/27/2020    3:00 PM 06/24/2020   11:20 AM  Advanced Directives  Does Patient Have a Medical Advance Directive? Yes Yes No No Yes No No  Type of Paramedic of [place];Living will    [place];Living will    Does patient want to make changes to medical advance directive? No - Patient declined        Copy of [place] in Chart? No - copy requested    Yes - validated most recent copy scanned in chart (See row information)    Would patient like information on creating a medical advance directive?    No - Patient declined Yes (Inpatient - patient requests chaplain consult to create a medical advance directive)  Yes (Inpatient - patient requests chaplain consult to create a medical advance directive) No - Patient declined    Current Medications (verified) Outpatient Encounter Medications as of 03/10/2022  Medication Sig   acetaminophen (TYLENOL) 325 MG tablet Take 1-2 tablets (325-650 mg total) by mouth every 4 (four) hours as needed for mild pain.   aspirin EC 81 MG tablet Take 1 tablet (81 mg total) by mouth daily. (Patient taking differently: Take 81 mg by mouth in the morning.)   atorvastatin (LIPITOR) 40 MG tablet Take 1 tablet (40 mg total) by mouth daily.   cilostazol (PLETAL) 50 MG tablet Take 1 tablet (50 mg total) by mouth 2 (two) times daily.   citalopram (CELEXA) 10 MG tablet Take 1 tablet (10 mg total) by mouth daily.   furosemide (LASIX) 20 MG tablet TAKE 1 TABLET BY MOUTH EVERY MORNING   gabapentin (NEURONTIN) 300 MG capsule TAKE 1 CAPSULE BY MOUTH THREE TIMES DAILY   hydrocerin (EUCERIN) CREA Apply 1 application. topically 2 (two) times daily.   lisinopril-hydrochlorothiazide (ZESTORETIC) 10-12.5 MG tablet TAKE 1 TABLET BY MOUTH ONCE DAILY   Multiple Vitamin (MULTIVITAMIN WITH MINERALS) TABS tablet Take 1 tablet by mouth daily. (Patient taking differently: Take 1 tablet by mouth daily at 12 noon.)   naltrexone (DEPADE) 50  MG tablet Take 0.5 tablets (25 mg total) by mouth daily. To help patient stop smoking   pantoprazole (PROTONIX) 40 MG tablet Take 1 tablet (40 mg total) by mouth 2 (two) times daily. TAKE 1 TABLET(40 MG) BY MOUTH TWICE DAILY   phenytoin (DILANTIN) 100 MG ER capsule Take 1 capsule (100 mg total) by mouth 2 (two) times daily for 30 days, THEN 1 capsule (100 mg total) daily.   QUEtiapine (SEROQUEL) 50 MG tablet Take 1 tablet (50 mg total) by mouth at bedtime.   sucralfate (CARAFATE) 1 g tablet TAKE 1 TABLET(1 GRAM) BY MOUTH FOUR TIMES DAILY WITH MEALS AND AT  BEDTIME   tamsulosin (FLOMAX) 0.4 MG CAPS capsule Take 2 capsules (0.8 mg total) by mouth daily after supper.   vitamin B-12 (CYANOCOBALAMIN) 1000 MCG tablet Take 1,000 mcg by mouth daily.   No facility-administered encounter medications on file as of 03/10/2022.    Allergies (verified) Patient has no known allergies.   History: Past Medical History:  Diagnosis Date   Arthritis    Chronic back pain    Duodenal adenoma    Gout    Hypertension    Stroke ([place]) 06/2020   Syncope and collapse    Past Surgical History:  Procedure Laterality Date   BIOPSY  07/13/2020   Procedure: BIOPSY;  Surgeon: Ladene Artist, MD;  Location: [place];  Service: Endoscopy;;   BIOPSY  11/28/2020   Procedure: BIOPSY;  Surgeon: Irving Copas., MD;  Location: [facility];  Service: Gastroenterology;;   CARDIAC CATHETERIZATION  2015   [facility]    COLONOSCOPY WITH PROPOFOL N/A 07/13/2020   Procedure: COLONOSCOPY WITH PROPOFOL;  Surgeon: Ladene Artist, MD;  Location[facility];  Service: Endoscopy;  Laterality: N/A;   COLONOSCOPY WITH PROPOFOL N/A 11/28/2020   Procedure: COLONOSCOPY WITH PROPOFOL;  Surgeon: Rush Landmark Telford Nab., MD;  Location: [facility] ENDOSCOPY;  Service: Gastroenterology;  Laterality: N/A;   ENDOSCOPIC MUCOSAL RESECTION N/A 11/28/2020   Procedure: ENDOSCOPIC MUCOSAL RESECTION;  Surgeon: Rush Landmark Telford Nab., MD;  Location: [facility] ENDOSCOPY;  Service: Gastroenterology;  Laterality: N/A;   ESOPHAGOGASTRODUODENOSCOPY N/A 07/13/2020   Procedure: ESOPHAGOGASTRODUODENOSCOPY (EGD);  Surgeon: Ladene Artist, MD;  Location: [facility] ENDOSCOPY;  Service: Endoscopy;  Laterality: N/A;   ESOPHAGOGASTRODUODENOSCOPY (EGD) WITH PROPOFOL N/A 11/28/2020   Procedure: ESOPHAGOGASTRODUODENOSCOPY (EGD) WITH PROPOFOL;  Surgeon: Rush Landmark Telford Nab., MD;  Location: [facility] ENDOS[facility];  Service: Gastroenterology;  Laterality: N/A;   HEMOSTASIS CLIP PLACEMENT  11/28/2020   Procedure: HEMOSTASIS CLIP PLACEMENT;   Surgeon: Irving Copas., MD;  Location: [facility];  Service: Gastroenterology;;   KNEE SURGERY Right    POLYPECTOMY  07/13/2020   Procedure: POLYPECTOMY;  Surgeon: Ladene Artist, MD;  Location: [place];  Service: Endoscopy;;   POLYPECTOMY  11/28/2020   Procedure: POLYPECTOMY;  Surgeon: Irving Copas., MD;  Location: [facility];  Service: Gastroenterology;;   SUBMUCOSAL LIFTING INJECTION  11/28/2020   Procedure: SUBMUCOSAL LIFTING INJECTION;  Surgeon: Irving Copas., MD;  Location: [facility];  Service: Gastroenterology;;   SUBMUCOSAL TATTOO INJECTION  11/28/2020   Procedure: SUBMUCOSAL TATTOO INJECTION;  Surgeon: Irving Copas., MD;  Location: [facility];  Service: Gastroenterology;;   Family History  Problem Relation Age of Onset   Hypertension Mother    Colon cancer Neg Hx    Pancreatic cancer Neg Hx    Stomach cancer Neg Hx    Esophageal cancer Neg Hx    Inflammatory bowel disease Neg Hx    Liver disease  Neg Hx    Rectal cancer Neg Hx    Social History   Socioeconomic History   Marital status: Single    Spouse name: Not on file   Number of children: 1   Years of education: 12   Highest education level: 12th grade  Occupational History   Not on file  Tobacco Use   Smoking status: Every Day    Packs/day: 1.00    Years: 20.00    Total pack years: 20.00    Types: Cigarettes    Passive exposure: Current   Smokeless tobacco: Never   Tobacco comments:    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, drilling  Vaping Use   Vaping Use: Never used  Substance and Sexual Activity   Alcohol use: Not Currently    Comment: occasional   Drug use: No   Sexual activity: Not Currently  Other Topics Concern   Not on file  Social History Narrative   Lives with daughter, son in law and 2 grandchildren   Right Handed   Drinks 1 cup caffeine 4 times a week-ish   Social Determinants of Health   Financial Resource Strain: Low Risk  (12/11/2021)    Overall Financial Resource Strain (CARDIA)    Difficulty of Paying Living Expenses: Not hard at all  Food Insecurity: No Food Insecurity (12/11/2021)   Hunger Vital Sign    Worried About Running Out of Food in the Last Year: Never true    Ran Out of Food in the Last Year: Never true  Transportation Needs: No Transportation Needs (12/11/2021)   PRAPARE - Hydrologist (Medical): No    Lack of Transportation (Non-Medical): No  Physical Activity: Inactive (12/11/2021)   Exercise Vital Sign    Days of Exercise per Week: 0 days    Minutes of Exercise per Session: 0 min  Stress: No Stress Concern Present (12/11/2021)   [place]    Feeling of Stress : Only a little  Social Connections: Unknown (12/11/2021)   Social Connection and Isolation Panel [NHANES]    Frequency of Communication with Friends and Family: More than three times a week    Frequency of Social Gatherings with Friends and Family: More than three times a week    Attends Religious Services: Never    Marine scientist or Organizations: No    Attends Music therapist: Never    Marital Status: Not on file    Tobacco Counseling Ready to quit: Not Answered Counseling given: Not Answered Tobacco comments: Verified by Daughter - Heather Womack   Clinical Intake:                 Diabetic?No         Activities of Daily Living    12/11/2021    8:47 AM  In your present state of health, do you have any difficulty performing the following activities:  Hearing? 0  Vision? 0  Difficulty concentrating or making decisions? 0  Walking or climbing stairs? 0  Dressing or bathing? 0  Doing errands, shopping? 1  Comment Daughter - Academic librarian and eating ? Y  Comment Daughter - [name] Assists  Using the Toilet? N  In the past six months, have you accidently leaked urine? N  Do  you have problems with loss of bowel control? N  Managing your Medications? Y  Comment Daughter - [name] Assists  Managing your Finances? Y  Comment Daughter - [name] Assists  Housekeeping or managing your Housekeeping? Y  Comment Daughter - [name] Assists    Patient Care Team: Marrian Salvage, FNP as PCP - General (Internal Medicine) O'Neal, Cassie Freer, MD as PCP - Cardiology (Cardiology)  Indicate any recent Medical Services you may have received from other than [facility] providers in the past year (date may be approximate).     Assessment:   This is a routine wellness examination for Anhad.  Hearing/Vision screen No results found.  Dietary issues and exercise activities discussed:     Goals Addressed   None    Depression Screen    12/11/2021    8:43 AM 09/14/2020    8:44 AM  PHQ 2/9 Scores  PHQ - 2 Score 1 2  PHQ- 9 Score  9  Exception Documentation Other- indicate reason in comment box   Not completed Verified by Daughter - [name]     Fall Risk    12/11/2021    8:47 AM 12/13/2020   10:38 AM 11/30/2020    9:07 AM 09/14/2020    8:44 AM  Fall Risk   Falls in the past year? 1 0 0 0  Number falls in past yr: 1  0 0  Injury with Fall? 0  0 0  Risk for fall due to : History of fall(s);Impaired balance/gait;Impaired mobility  Other (Comment)   Follow up Falls evaluation completed;Education provided;Falls prevention discussed  Falls evaluation completed     FALL RISK PREVENTION PERTAINING TO THE HOME:  Any stairs in or around the home? {YES/NO:21197} If so, are there any without handrails? {YES/NO:21197} Home free of loose throw rugs in walkways, pet beds, electrical cords, etc? {YES/NO:21197} Adequate lighting in your home to reduce risk of falls? {YES/NO:21197}  ASSISTIVE DEVICES UTILIZED TO PREVENT FALLS:  Life alert? {YES/NO:21197} Use of a cane, walker or w/c? {YES/NO:21197} Grab bars in the bathroom?  {YES/NO:21197} Shower chair or bench in shower? {YES/NO:21197} Elevated toilet seat or a handicapped toilet? {YES/NO:21197}  TIMED UP AND GO:  Was the test performed? No . Phone visit   Cognitive Function:    01/20/2022   11:04 AM 02/25/2021    8:43 AM 08/30/2020   10:38 AM  MMSE - Mini Mental State Exam  Orientation to time 0 1 2  Orientation to Place '3 3 3  '$ Registration '3 3 3  '$ Attention/ Calculation 0 0 0  Recall '1 3 1  '$ Language- name 2 objects '1 1 2  '$ Language- repeat 0 0 0  Language- follow 3 step command 3 0 3  Language- read & follow direction 0 0 0  Write a sentence 0 0 0  Copy design 0 0 0  Total score '11 11 14        '$ Immunizations Immunization History  Administered Date(s) Administered   Fluad Quad(high Dose 65+) 05/08/2021   Moderna Sars-Covid-2 Vaccination 12/11/2019, 01/08/2020   Pneumococcal Conjugate-13 01/29/2015   Pneumococcal Polysaccharide-23 11/15/2008    TDAP status: Due, Education has been provided regarding the importance of this vaccine. Advised may receive this vaccine at local pharmacy or Health Dept. Aware to provide a copy of the vaccination record if obtained from local pharmacy or Health Dept. Verbalized acceptance and understanding.  Flu Vaccine status: Up to date  Pneumococcal vaccine status: Up to date  Covid-19 vaccine status: Information provided on how to obtain vaccines.   Qualifies for Shingles Vaccine? Yes   Zostavax completed  No   Shingrix Completed?: No.    Education has been provided regarding the importance of this vaccine. Patient has been advised to call insurance company to determine out of pocket expense if they have not yet received this vaccine. Advised may also receive vaccine at local pharmacy or Health Dept. Verbalized acceptance and understanding.  Screening Tests Health Maintenance  Topic Date Due   Hepatitis C Screening  Never done   TETANUS/TDAP  Never done   Zoster Vaccines- Shingrix (1 of 2) Never done    COVID-19 Vaccine (3 - Moderna series) 03/04/2020   INFLUENZA VACCINE  03/04/2022   Pneumonia Vaccine 74+ Years old  Completed   HPV VACCINES  Aged Out   COLONOSCOPY (Pts 45-40yr Insurance coverage will need to be confirmed)  Discontinued    Health Maintenance  Health Maintenance Due  Topic Date Due   Hepatitis C Screening  Never done   TETANUS/TDAP  Never done   Zoster Vaccines- Shingrix (1 of 2) Never done   COVID-19 Vaccine (3 - Moderna series) 03/04/2020   INFLUENZA VACCINE  03/04/2022    Colorectal cancer screening: Type of screening: Colonoscopy. Completed 11/28/2020. Repeat every 3 years  Lung Cancer Screening: (Low Dose CT Chest recommended if Age 78-80years, 30 pack-year currently smoking OR have quit w/in 15years.) does not qualify.     Additional Screening:  Hepatitis C Screening: does qualify; Patient to discuss with PCP  Vision Screening: Recommended annual ophthalmology exams for early detection of glaucoma and other disorders of the eye. Is the patient up to date with their annual eye exam?  {YES/NO:21197} Who is the provider or what is the name of the office in which the patient attends annual eye exams? *** If pt is not established with a provider, would they like to be referred to a provider to establish care? {YES/NO:21197}.   Dental Screening: Recommended annual dental exams for proper oral hygiene  Community Resource Referral / Chronic Care Management: CRR required this visit?  {YES/NO:21197}  CCM required this visit?  {YES/NO:21197}     Plan:     I have personally reviewed and noted the following in the patient's chart:   Medical and social history Use of alcohol, tobacco or illicit drugs  Current medications and supplements including opioid prescriptions. {Opioid Prescriptions:[id number]} Functional ability and status Nutritional status Physical activity Advanced directives List of other physicians Hospitalizations, surgeries, and ER  visits in previous 12 months Vitals Screenings to include cognitive, depression, and falls Referrals and appointments  In addition, I have reviewed and discussed with patient certain preventive protocols, quality metrics, and best practice recommendations. A written personalized care plan for preventive services as well as general preventive health recommendations were provided to patient.   Due to this being a telephonic visit, the after visit summary with patients personalized plan was offered to patient via mail or my-chart.  Patient would like to access on my-chart.   MMarta Antu LPN   82/04/5620 Nurse Health Advisor  Nurse Notes: ***

## 2022-03-10 NOTE — Telephone Encounter (Signed)
*  STAT* If patient is at the pharmacy, call can be transferred to refill team.   1. Which medications need to be refilled? (please list name of each medication and dose if known) gabapentin (NEURONTIN) 300 MG capsule  2. Which pharmacy/location (including street and city if local pharmacy) is medication to be sent to? [place] they need a 30 day or 90 day supply? 90 day

## 2022-03-12 ENCOUNTER — Other Ambulatory Visit: Payer: Self-pay | Admitting: Cardiovascular Disease

## 2022-03-12 NOTE — Telephone Encounter (Signed)
Fax refill request to [phone]

## 2022-03-12 NOTE — Telephone Encounter (Signed)
*  STAT* If patient is at the pharmacy, call can be transferred to refill team.   1. Which medications need to be refilled? (please list name of each medication and dose if known) gabapentin (NEURONTIN) 300 MG capsule  2. Which pharmacy/location (including street and city if local pharmacy) is medication to be sent to? [place], [place]  3. Do they need a 30 day or 90 day supply? Andres White

## 2022-03-13 ENCOUNTER — Telehealth: Payer: Self-pay | Admitting: Cardiovascular Disease

## 2022-03-13 NOTE — Telephone Encounter (Signed)
Pt c/o medication issue:  1. Name of Medication:   gabapentin (NEURONTIN) 300 MG capsule    2. How are you currently taking this medication (dosage and times per day)?  TAKE 1 CAPSULE BY MOUTH THREE TIMES DAILY 3. Are you having a reaction (difficulty breathing--STAT)? No  4. What is your medication issue? Pharmacy states that they need prescription for medication sent over. Please advise

## 2022-03-13 NOTE — Telephone Encounter (Signed)
Message sent to pharmacy send refill to PCP.

## 2022-03-14 ENCOUNTER — Telehealth: Payer: Self-pay | Admitting: Family

## 2022-03-14 NOTE — Telephone Encounter (Signed)
Medication:  gabapentin (NEURONTIN) 300 MG capsule [[id number]]   Has the patient contacted their pharmacy? Yes.   Pharmacy called to request prescription  Preferred Pharmacy (with phone number or street name): [facility], [place], [place] [address]  Phone:  [phone]  Fax:  [phone]  DEA #:  [id number]  Agent: Please be advised that RX refills may take up to 3 business days. We ask that you follow-up with your pharmacy.

## 2022-03-14 NOTE — Telephone Encounter (Signed)
Attempted to call the phone number and there was no response. There was no dial tone.

## 2022-03-14 NOTE — Telephone Encounter (Signed)
Fax # [phone] is the number given by Morey Hummingbird at [facility]

## 2022-03-14 NOTE — Telephone Encounter (Signed)
Pharmacy called advised them of message below.

## 2022-03-17 ENCOUNTER — Telehealth: Payer: Self-pay | Admitting: Cardiovascular Disease

## 2022-03-17 NOTE — Telephone Encounter (Signed)
The pharmacy tech Morey Hummingbird called back and I have informed her that we were not the last provider to fill the gabapentin. I have given her the providers name and phone number.   [facility] number is [phone] Pharmacy number is [phone].  I have informed the pharm tech I will ask the Mickel Baas about the refill request,  but she should call the other provider as well.

## 2022-03-17 NOTE — Telephone Encounter (Signed)
*  STAT* If patient is at the pharmacy, call can be transferred to refill team.   1. Which medications need to be refilled? (please list name of each medication and dose if known)  gabapentin (NEURONTIN) 300 MG capsule  2. Which pharmacy/location (including street and city if local pharmacy) is medication to be sent to? [place].  3. Do they need a 30 day or 90 day supply? 90 day supply

## 2022-03-18 NOTE — Telephone Encounter (Signed)
Pharmacy is calling back to follow up on refill request. Please advise.

## 2022-03-19 NOTE — Telephone Encounter (Signed)
Called pharmacy- advised that we sent this in as a one time refill, it was continued to be refilled by mistake- patient should see PCP for further refills.  She verbalized understanding.

## 2022-03-28 ENCOUNTER — Other Ambulatory Visit: Payer: Self-pay | Admitting: Cardiovascular Disease

## 2022-03-31 ENCOUNTER — Telehealth: Payer: Self-pay | Admitting: Cardiovascular Disease

## 2022-03-31 ENCOUNTER — Ambulatory Visit (INDEPENDENT_AMBULATORY_CARE_PROVIDER_SITE_OTHER): Payer: Medicare Other

## 2022-03-31 ENCOUNTER — Other Ambulatory Visit: Payer: Self-pay

## 2022-03-31 VITALS — Ht 67.0 in | Wt 185.0 lb

## 2022-03-31 DIAGNOSIS — Z0000 Encounter for general adult medical examination without abnormal findings: Secondary | ICD-10-CM | POA: Diagnosis not present

## 2022-03-31 NOTE — Telephone Encounter (Signed)
*  STAT* If patient is at the pharmacy, call can be transferred to refill team.   1. Which medications need to be refilled? (please list name of each medication and dose if known) gabapentin (NEURONTIN) 300 MG capsule  2. Which pharmacy/location (including street and city if local pharmacy) is medication to be sent to? [place] they need a 30 day or 90 day supply? 30 day

## 2022-03-31 NOTE — Patient Instructions (Signed)
Andres White , Thank you for taking time to complete your Medicare Wellness Visit. I appreciate your ongoing commitment to your health goals. Please review the following plan we discussed and let me know if I can assist you in the future.   Screening recommendations/referrals: Colonoscopy: Completed 11/28/2020-Due 11/29/2023 Recommended yearly ophthalmology/optometry visit for glaucoma screening and checkup Recommended yearly dental visit for hygiene and checkup  Vaccinations: Influenza vaccine: Due 04/2022 Pneumococcal vaccine: Up to date Tdap vaccine: Due-May obtain vaccine at your local pharmacy. Shingles vaccine: Due-May obtain vaccine at your local pharmacy.   Covid-19: May obtain vaccine at your local pharmacy.  Advanced directives: Please bring a copy of Living Will and/or Healthcare Power of Attorney for your chart.   Conditions/risks identified: See problem list  Next appointment: Follow up in one year for your annual wellness visit.   Preventive Care 39 Years and Older, Male Preventive care refers to lifestyle choices and visits with your health care provider that can promote health and wellness. What does preventive care include? A yearly physical exam. This is also called an annual well check. Dental exams once or twice a year. Routine eye exams. Ask your health care provider how often you should have your eyes checked. Personal lifestyle choices, including: Daily care of your teeth and gums. Regular physical activity. Eating a healthy diet. Avoiding tobacco and drug use. Limiting alcohol use. Practicing safe sex. Taking low doses of aspirin every day. Taking vitamin and mineral supplements as recommended by your health care provider. What happens during an annual well check? The services and screenings done by your health care provider during your annual well check will depend on your age, overall health, lifestyle risk factors, and family history of  disease. Counseling  Your health care provider may ask you questions about your: Alcohol use. Tobacco use. Drug use. Emotional well-being. Home and relationship well-being. Sexual activity. Eating habits. History of falls. Memory and ability to understand (cognition). Work and work Statistician. Screening  You may have the following tests or measurements: Height, weight, and BMI. Blood pressure. Lipid and cholesterol levels. These may be checked every 5 years, or more frequently if you are over 22 years old. Skin check. Lung cancer screening. You may have this screening every year starting at age 77 if you have a 30-pack-year history of smoking and currently smoke or have quit within the past 15 years. Fecal occult blood test (FOBT) of the stool. You may have this test every year starting at age 62. Flexible sigmoidoscopy or colonoscopy. You may have a sigmoidoscopy every 5 years or a colonoscopy every 10 years starting at age 5. Prostate cancer screening. Recommendations will vary depending on your family history and other risks. Hepatitis C blood test. Hepatitis B blood test. Sexually transmitted disease (STD) testing. Diabetes screening. This is done by checking your blood sugar (glucose) after you have not eaten for a while (fasting). You may have this done every 1-3 years. Abdominal aortic aneurysm (AAA) screening. You may need this if you are a current or former smoker. Osteoporosis. You may be screened starting at age 54 if you are at high risk. Talk with your health care provider about your test results, treatment options, and if necessary, the need for more tests. Vaccines  Your health care provider may recommend certain vaccines, such as: Influenza vaccine. This is recommended every year. Tetanus, diphtheria, and acellular pertussis (Tdap, Td) vaccine. You may need a Td booster every 10 years. Zoster vaccine. You may need  this after age 54. Pneumococcal 13-valent  conjugate (PCV13) vaccine. One dose is recommended after age 48. Pneumococcal polysaccharide (PPSV23) vaccine. One dose is recommended after age 54. Talk to your health care provider about which screenings and vaccines you need and how often you need them. This information is not intended to replace advice given to you by your health care provider. Make sure you discuss any questions you have with your health care provider. Document Released: 08/17/2015 Document Revised: 04/09/2016 Document Reviewed: 05/22/2015 Elsevier Interactive Patient Education  2017 [employer] Prevention in the Home Falls can cause injuries. They can happen to people of all ages. There are many things you can do to make your home safe and to help prevent falls. What can I do on the outside of my home? Regularly fix the edges of walkways and driveways and fix any cracks. Remove anything that might make you trip as you walk through a door, such as a raised step or threshold. Trim any bushes or trees on the path to your home. Use bright outdoor lighting. Clear any walking paths of anything that might make someone trip, such as rocks or tools. Regularly check to see if handrails are loose or broken. Make sure that both sides of any steps have handrails. Any raised decks and porches should have guardrails on the edges. Have any leaves, snow, or ice cleared regularly. Use sand or salt on walking paths during winter. Clean up any spills in your garage right away. This includes oil or grease spills. What can I do in the bathroom? Use night lights. Install grab bars by the toilet and in the tub and shower. Do not use towel bars as grab bars. Use non-skid mats or decals in the tub or shower. If you need to sit down in the shower, use a plastic, non-slip stool. Keep the floor dry. Clean up any water that spills on the floor as soon as it happens. Remove soap buildup in the tub or shower regularly. Attach bath mats  securely with double-sided non-slip rug tape. Do not have throw rugs and other things on the floor that can make you trip. What can I do in the bedroom? Use night lights. Make sure that you have a light by your bed that is easy to reach. Do not use any sheets or blankets that are too big for your bed. They should not hang down onto the floor. Have a firm chair that has side arms. You can use this for support while you get dressed. Do not have throw rugs and other things on the floor that can make you trip. What can I do in the kitchen? Clean up any spills right away. Avoid walking on wet floors. Keep items that you use a lot in easy-to-reach places. If you need to reach something above you, use a strong step stool that has a grab bar. Keep electrical cords out of the way. Do not use floor polish or wax that makes floors slippery. If you must use wax, use non-skid floor wax. Do not have throw rugs and other things on the floor that can make you trip. What can I do with my stairs? Do not leave any items on the stairs. Make sure that there are handrails on both sides of the stairs and use them. Fix handrails that are broken or loose. Make sure that handrails are as long as the stairways. Check any carpeting to make sure that it is firmly attached to  the stairs. Fix any carpet that is loose or worn. Avoid having throw rugs at the top or bottom of the stairs. If you do have throw rugs, attach them to the floor with carpet tape. Make sure that you have a light switch at the top of the stairs and the bottom of the stairs. If you do not have them, ask someone to add them for you. What else can I do to help prevent falls? Wear shoes that: Do not have high heels. Have rubber bottoms. Are comfortable and fit you well. Are closed at the toe. Do not wear sandals. If you use a stepladder: Make sure that it is fully opened. Do not climb a closed stepladder. Make sure that both sides of the stepladder  are locked into place. Ask someone to hold it for you, if possible. Clearly mark and make sure that you can see: Any grab bars or handrails. First and last steps. Where the edge of each step is. Use tools that help you move around (mobility aids) if they are needed. These include: Canes. Walkers. Scooters. Crutches. Turn on the lights when you go into a dark area. Replace any light bulbs as soon as they burn out. Set up your furniture so you have a clear path. Avoid moving your furniture around. If any of your floors are uneven, fix them. If there are any pets around you, be aware of where they are. Review your medicines with your doctor. Some medicines can make you feel dizzy. This can increase your chance of falling. Ask your doctor what other things that you can do to help prevent falls. This information is not intended to replace advice given to you by your health care provider. Make sure you discuss any questions you have with your health care provider. Document Released: 05/17/2009 Document Revised: 12/27/2015 Document Reviewed: 08/25/2014 Elsevier Interactive Patient Education  2017 [employer].

## 2022-03-31 NOTE — Telephone Encounter (Signed)
Sent to PCP ?

## 2022-03-31 NOTE — Telephone Encounter (Signed)
I have denied refill request- I attempted to call again to advise refills should be sent to PCP.   See previous telephone call. Thanks!

## 2022-03-31 NOTE — Progress Notes (Signed)
Subjective:   Andres White is a 76 y.o. male who presents for an Initial Medicare Annual Wellness Visit.  I connected with Andres White today by telephone and verified that I am speaking with the correct person using two identifiers. Location patient: home Location provider: work Persons participating in the virtual visit: patient, daughter,nurse.    I discussed the limitations, risks, security and privacy concerns of performing an evaluation and management service by telephone and the availability of in person appointments. I also discussed with the patient that there may be a patient responsible charge related to this service. The patient expressed understanding and verbally consented to this telephonic visit.    Interactive audio and video telecommunications were attempted between this provider and patient, however failed, due to patient having technical difficulties OR patient did not have access to video capability.  We continued and completed visit with audio only.  Some vital signs may be absent or patient reported.   Time Spent with patient on telephone encounter: 20 minutes  Daughter is patient's caregiver & she answered questions for patient today   Review of Systems     Cardiac Risk Factors include: advanced age (>76mn, >>67women);male gender;hypertension;dyslipidemia;sedentary lifestyle;smoking/ tobacco exposure     Objective:    Today's Vitals   03/31/22 1107  Weight: 185 lb (83.9 kg)  Height: '5\' 7"'$  (1.702 m)   Body mass index is 28.98 kg/m.     03/31/2022   11:11 AM 12/11/2021    8:49 AM 03/05/2021    9:33 AM 11/28/2020    6:19 AM 09/17/2020   10:00 AM 07/13/2020    8:43 AM 06/27/2020    3:00 PM  Advanced Directives  Does Patient Have a Medical Advance Directive? Yes Yes Yes No No Yes No  Type of [place] will    [place]   Does patient want to make changes to  medical advance directive?  No - Patient declined       Copy of [place] Chart? No - copy requested No - copy requested    Yes - validated most recent copy scanned in chart (See row information)   Would patient like information on creating a medical advance directive?    No - Patient declined Yes (Inpatient - patient requests chaplain consult to create a medical advance directive)  Yes (Inpatient - patient requests chaplain consult to create a medical advance directive)    Current Medications (verified) Outpatient Encounter Medications as of 03/31/2022  Medication Sig   acetaminophen (TYLENOL) 325 MG tablet Take 1-2 tablets (325-650 mg total) by mouth every 4 (four) hours as needed for mild pain.   aspirin EC 81 MG tablet Take 1 tablet (81 mg total) by mouth daily. (Patient taking differently: Take 81 mg by mouth in the morning.)   atorvastatin (LIPITOR) 40 MG tablet Take 1 tablet (40 mg total) by mouth daily.   cilostazol (PLETAL) 50 MG tablet Take 1 tablet (50 mg total) by mouth 2 (two) times daily.   citalopram (CELEXA) 10 MG tablet Take 1 tablet (10 mg total) by mouth daily.   furosemide (LASIX) 20 MG tablet TAKE 1 TABLET BY MOUTH EVERY MORNING   gabapentin (NEURONTIN) 300 MG capsule TAKE 1 CAPSULE BY MOUTH THREE TIMES DAILY   hydrocerin (EUCERIN) CREA Apply 1 application. topically 2 (two) times daily.   lisinopril-hydrochlorothiazide (ZESTORETIC) 10-12.5 MG tablet TAKE 1 TABLET BY MOUTH ONCE  DAILY   Multiple Vitamin (MULTIVITAMIN WITH MINERALS) TABS tablet Take 1 tablet by mouth daily. (Patient taking differently: Take 1 tablet by mouth daily at 12 noon.)   naltrexone (DEPADE) 50 MG tablet Take 0.5 tablets (25 mg total) by mouth daily. To help patient stop smoking   pantoprazole (PROTONIX) 40 MG tablet Take 1 tablet (40 mg total) by mouth 2 (two) times daily. TAKE 1 TABLET(40 MG) BY MOUTH TWICE DAILY   QUEtiapine (SEROQUEL) 50 MG tablet Take 1 tablet (50 mg total) by  mouth at bedtime.   sucralfate (CARAFATE) 1 g tablet TAKE 1 TABLET(1 GRAM) BY MOUTH FOUR TIMES DAILY WITH MEALS AND AT BEDTIME   tamsulosin (FLOMAX) 0.4 MG CAPS capsule Take 2 capsules (0.8 mg total) by mouth daily after supper.   vitamin B-12 (CYANOCOBALAMIN) 1000 MCG tablet Take 1,000 mcg by mouth daily.   phenytoin (DILANTIN) 100 MG ER capsule Take 1 capsule (100 mg total) by mouth 2 (two) times daily for 30 days, THEN 1 capsule (100 mg total) daily.   No facility-administered encounter medications on file as of 03/31/2022.    Allergies (verified) Patient has no known allergies.   History: Past Medical History:  Diagnosis Date   Arthritis    Chronic back pain    Duodenal adenoma    Gout    Hypertension    Stroke ([place]) 06/2020   Syncope and collapse    Past Surgical History:  Procedure Laterality Date   BIOPSY  07/13/2020   Procedure: BIOPSY;  Surgeon: Ladene Artist, MD;  Location: [facility];  Service: Endoscopy;;   BIOPSY  11/28/2020   Procedure: BIOPSY;  Surgeon: Irving Copas., MD;  Location: [facility];  Service: Gastroenterology;;   CARDIAC CATHETERIZATION  2015   [facility]    COLONOSCOPY WITH PROPOFOL N/A 07/13/2020   Procedure: COLONOSCOPY WITH PROPOFOL;  Surgeon: Ladene Artist, MD;  Location: [facility] ENDOSCOPY;  Service: Endoscopy;  Laterality: N/A;   COLONOSCOPY WITH PROPOFOL N/A 11/28/2020   Procedure: COLONOSCOPY WITH PROPOFOL;  Surgeon: Rush Landmark Telford Nab., MD;  Location: [facility] ENDOSCOPY;  Service: Gastroenterology;  Laterality: N/A;   ENDOSCOPIC MUCOSAL RESECTION N/A 11/28/2020   Procedure: ENDOSCOPIC MUCOSAL RESECTION;  Surgeon: Rush Landmark Telford Nab., MD;  Location: [facility] ENDOSCOPY;  Service: Gastroenterology;  Laterality: N/A;   ESOPHAGOGASTRODUODENOSCOPY N/A 07/13/2020   Procedure: ESOPHAGOGASTRODUODENOSCOPY (EGD);  Surgeon: Ladene Artist, MD;  Location: [facility] ENDOSCOPY;  Service: Endoscopy;  Laterality: N/A;   ESOPHAGOGASTRODUODENOSCOPY (EGD) WITH  PROPOFOL N/A 11/28/2020   Procedure: ESOPHAGOGASTRODUODENOSCOPY (EGD) WITH PROPOFOL;  Surgeon: Rush Landmark Telford Nab., MD;  Location: [facility] ENDOSCOPY;  Service: Gastroenterology;  Laterality: N/A;   HEMOSTASIS CLIP PLACEMENT  11/28/2020   Procedure: HEMOSTASIS CLIP PLACEMENT;  Surgeon: Irving Copas., MD;  Location: [facility];  Service: Gastroenterology;;   KNEE SURGERY Right    POLYPECTOMY  07/13/2020   Procedure: POLYPECTOMY;  Surgeon: Ladene Artist, MD;  Location: [place];  Service: Endoscopy;;   POLYPECTOMY  11/28/2020   Procedure: POLYPECTOMY;  Surgeon: Irving Copas., MD;  Location: [facility];  Service: Gastroenterology;;   SUBMUCOSAL LIFTING INJECTION  11/28/2020   Procedure: SUBMUCOSAL LIFTING INJECTION;  Surgeon: Irving Copas., MD;  Location: [facility];  Service: Gastroenterology;;   SUBMUCOSAL TATTOO INJECTION  11/28/2020   Procedure: SUBMUCOSAL TATTOO INJECTION;  Surgeon: Irving Copas., MD;  Location: [facility] ENDOSCOPY;  Service: Gastroenterology;;   Family History  Problem Relation Age of Onset   Hypertension Mother    Colon cancer Neg Hx  Pancreatic cancer Neg Hx    Stomach cancer Neg Hx    Esophageal cancer Neg Hx    Inflammatory bowel disease Neg Hx    Liver disease Neg Hx    Rectal cancer Neg Hx    Social History   Socioeconomic History   Marital status: Single    Spouse name: Not on file   Number of children: 1   Years of education: 10   Highest education level: 12th grade  Occupational History   Not on file  Tobacco Use   Smoking status: Every Day    Packs/day: 1.00    Years: 20.00    Total pack years: 20.00    Types: Cigarettes    Passive exposure: Current   Smokeless tobacco: Never   Tobacco comments:    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, drilling  Vaping Use   Vaping Use: Never used  Substance and Sexual Activity   Alcohol use: Not Currently    Comment: occasional   Drug use: No   Sexual activity: Not  Currently  Other Topics Concern   Not on file  Social History Narrative   Lives with daughter, son in law and 2 grandchildren   Right Handed   Drinks 1 cup caffeine 4 times a week-ish   Social Determinants of Health   Financial Resource Strain: Low Risk  (03/31/2022)   Overall Financial Resource Strain (CARDIA)    Difficulty of Paying Living Expenses: Not hard at all  Food Insecurity: No Food Insecurity (03/31/2022)   Hunger Vital Sign    Worried About Running Out of Food in the Last Year: Never true    Ran Out of Food in the Last Year: Never true  Transportation Needs: No Transportation Needs (12/11/2021)   PRAPARE - Hydrologist (Medical): No    Lack of Transportation (Non-Medical): No  Physical Activity: Inactive (12/11/2021)   Exercise Vital Sign    Days of Exercise per Week: 0 days    Minutes of Exercise per Session: 0 min  Stress: No Stress Concern Present (12/11/2021)   [place]    Feeling of Stress : Only a little  Social Connections: Socially Isolated (03/31/2022)   Social Connection and Isolation Panel [NHANES]    Frequency of Communication with Friends and Family: More than three times a week    Frequency of Social Gatherings with Friends and Family: More than three times a week    Attends Religious Services: Never    Marine scientist or Organizations: No    Attends Music therapist: Never    Marital Status: Divorced    Tobacco Counseling Ready to quit: Not Answered Counseling given: Not Answered Tobacco comments: Verified by Daughter - Water quality scientist [place]   Clinical Intake:  Pre-visit preparation completed: Yes  Pain : No/denies pain     BMI - recorded: 28.98 Nutritional Status: BMI 25 -29 Overweight Nutritional Risks: None Diabetes: No  How often do you need to have someone help you when you read instructions, pamphlets, or other written  materials from your doctor or pharmacy?: 5 - Always  Diabetic?No  Interpreter Needed?: No  Information entered by :: Caroleen Hamman LPN   Activities of Daily Living    03/31/2022   11:14 AM 12/11/2021    8:47 AM  In your present state of health, do you have any difficulty performing the following activities:  Hearing? 0 0  Vision? 0 0  Difficulty concentrating or making decisions? 1 0  Comment sees neurology   Walking or climbing stairs? 1 0  Dressing or bathing? 1 0  Doing errands, shopping? 1 1  Comment  Daughter - Academic librarian and eating ? Tempie Donning  Comment  Daughter - [name] Assists  Using the Toilet? Y N  In the past six months, have you accidently leaked urine? Y N  Do you have problems with loss of bowel control? Y N  Managing your Medications? Tempie Donning  Comment  Daughter - [name] Assists  Managing your Finances? Tempie Donning  Comment  Daughter - [name] Assists  Housekeeping or managing your Housekeeping? Tempie Donning  Comment  Daughter - [name] Assists    Patient Care Team: Marrian Salvage, FNP as PCP - General (Internal Medicine) O'Neal, Cassie Freer, MD as PCP - Cardiology (Cardiology)  Indicate any recent Medical Services you may have received from other than [facility] providers in the past year (date may be approximate).     Assessment:   This is a routine wellness examination for Andres White.  Hearing/Vision screen Hearing Screening - Comments:: No issues Vision Screening - Comments:: Last eye exam-2 years ago  Dietary issues and exercise activities discussed: Current Exercise Habits: The patient does not participate in regular exercise at present   Goals Addressed             This Visit's Progress    Patient Stated       Walk longer distances       Depression Screen    03/31/2022   11:14 AM 12/11/2021    8:43 AM 09/14/2020    8:44 AM  PHQ 2/9 Scores  PHQ - 2 Score  1 2  PHQ- 9 Score   9  Exception  Documentation Other- indicate reason in comment box Other- indicate reason in comment box   Not completed  Verified by Daughter [name]     Fall Risk    03/31/2022   11:12 AM 12/11/2021    8:47 AM 12/13/2020   10:38 AM 11/30/2020    9:07 AM 09/14/2020    8:44 AM  Fall Risk   Falls in the past year? 1 1 0 0 0  Number falls in past yr: 0 1  0 0  Injury with Fall? 0 0  0 0  Risk for fall due to :  History of fall(s);Impaired balance/gait;Impaired mobility  Other (Comment)   Follow up Falls prevention discussed Falls evaluation completed;Education provided;Falls prevention discussed  Falls evaluation completed     FALL RISK PREVENTION PERTAINING TO THE HOME:  Any stairs in or around the home? No  Home free of loose throw rugs in walkways, pet beds, electrical cords, etc? Yes  Adequate lighting in your home to reduce risk of falls? Yes   ASSISTIVE DEVICES UTILIZED TO PREVENT FALLS:  Life alert? No  Use of a cane, walker or w/c? Yes  Grab bars in the bathroom? Yes  Shower chair or bench in shower? Yes  Elevated toilet seat or a handicapped toilet? Yes   TIMED UP AND GO:  Was the test performed? No . Phone visit  Cognitive Function: Patient has current diagnosis of cognitive impairment. Patient is followed by neurology for ongoing assessment.      01/20/2022   11:04 AM 02/25/2021    8:43 AM 08/30/2020   10:38 AM  MMSE - Mini Mental State Exam  Orientation to time  0 1 2  Orientation to Place '3 3 3  '$ Registration '3 3 3  '$ Attention/ Calculation 0 0 0  Recall '1 3 1  '$ Language- name 2 objects '1 1 2  '$ Language- repeat 0 0 0  Language- follow 3 step command 3 0 3  Language- read & follow direction 0 0 0  Write a sentence 0 0 0  Copy design 0 0 0  Total score '11 11 14        '$ Immunizations Immunization History  Administered Date(s) Administered   Fluad Quad(high Dose 65+) 05/08/2021   Moderna Sars-Covid-2 Vaccination 12/11/2019, 01/08/2020   Pneumococcal Conjugate-13  01/29/2015   Pneumococcal Polysaccharide-23 11/15/2008    TDAP status: Due, Education has been provided regarding the importance of this vaccine. Advised may receive this vaccine at local pharmacy or Health Dept. Aware to provide a copy of the vaccination record if obtained from local pharmacy or Health Dept. Verbalized acceptance and understanding.  Flu Vaccine status: Up to date  Pneumococcal vaccine status: Up to date  Covid-19 vaccine status: Information provided on how to obtain vaccines.   Qualifies for Shingles Vaccine? Yes   Zostavax completed No   Shingrix Completed?: No.    Education has been provided regarding the importance of this vaccine. Patient has been advised to call insurance company to determine out of pocket expense if they have not yet received this vaccine. Advised may also receive vaccine at local pharmacy or Health Dept. Verbalized acceptance and understanding.  Screening Tests Health Maintenance  Topic Date Due   Hepatitis C Screening  Never done   TETANUS/TDAP  Never done   Zoster Vaccines- Shingrix (1 of 2) Never done   COVID-19 Vaccine (3 - Moderna series) 03/04/2020   INFLUENZA VACCINE  03/04/2022   Pneumonia Vaccine 27+ Years old  Completed   HPV VACCINES  Aged Out   COLONOSCOPY (Pts 45-27yr Insurance coverage will need to be confirmed)  Discontinued    Health Maintenance  Health Maintenance Due  Topic Date Due   Hepatitis C Screening  Never done   TETANUS/TDAP  Never done   Zoster Vaccines- Shingrix (1 of 2) Never done   COVID-19 Vaccine (3 - Moderna series) 03/04/2020   INFLUENZA VACCINE  03/04/2022    Colorectal cancer screening: Type of screening: Colonoscopy. Completed 11/28/2020. Repeat every 3 years  Lung Cancer Screening: (Low Dose CT Chest recommended if Age 78-80years, 30 pack-year currently smoking OR have quit w/in 15years.) does not qualify.    Additional Screening:  Hepatitis C Screening: does qualify; Patient to discuss  with PCP at next visit  Vision Screening: Recommended annual ophthalmology exams for early detection of glaucoma and other disorders of the eye. Is the patient up to date with their annual eye exam?  No  Who is the provider or what is the name of the office in which the patient attends annual eye exams? unknown   Dental Screening: Recommended annual dental exams for proper oral hygiene  Community Resource Referral / Chronic Care Management: CRR required this visit?  No   CCM required this visit?  No      Plan:     I have personally reviewed and noted the following in the patient's chart:   Medical and social history Use of alcohol, tobacco or illicit drugs  Current medications and supplements including opioid prescriptions. Patient is not currently taking opioid prescriptions. Functional ability and status Nutritional status Physical activity Advanced directives List of other physicians Hospitalizations, surgeries,  and ER visits in previous 12 months Vitals Screenings to include cognitive, depression, and falls Referrals and appointments  In addition, I have reviewed and discussed with patient certain preventive protocols, quality metrics, and best practice recommendations. A written personalized care plan for preventive services as well as general preventive health recommendations were provided to patient.   Due to this being a telephonic visit, the after visit summary with patients personalized plan was offered to patient via mail or my-chart. Patient would like to access on my-chart.  Marta Antu, LPN   [id number]  Nurse Health Advisor  Nurse Notes: None

## 2022-04-01 ENCOUNTER — Other Ambulatory Visit: Payer: Self-pay | Admitting: Cardiovascular Disease

## 2022-04-01 NOTE — Telephone Encounter (Signed)
Pt c/o medication issue:  1. Name of Medication:   gabapentin (NEURONTIN) 300 MG capsule  2. How are you currently taking this medication (dosage and times per day)? N/A  3. Are you having a reaction (difficulty breathing--STAT)? N/A  4. What is your medication issue?   Caller stated she is following up on the prescription for this medication.

## 2022-04-02 NOTE — Telephone Encounter (Signed)
RN left a detail message to [place]  at [facility].   Medication will not be  refilled . This one time prescription initially. If patient needs refill please contact primary    May call back for any questions

## 2022-04-03 ENCOUNTER — Other Ambulatory Visit: Payer: Self-pay | Admitting: Physical Medicine and Rehabilitation

## 2022-04-08 ENCOUNTER — Other Ambulatory Visit: Payer: Self-pay | Admitting: Adult Health

## 2022-04-08 DIAGNOSIS — R569 Unspecified convulsions: Secondary | ICD-10-CM

## 2022-04-10 ENCOUNTER — Other Ambulatory Visit: Payer: Self-pay | Admitting: Family

## 2022-04-10 ENCOUNTER — Other Ambulatory Visit: Payer: Self-pay | Admitting: Physical Medicine and Rehabilitation

## 2022-04-15 ENCOUNTER — Ambulatory Visit (INDEPENDENT_AMBULATORY_CARE_PROVIDER_SITE_OTHER): Payer: Medicare Other | Admitting: Family

## 2022-04-15 ENCOUNTER — Encounter: Payer: Self-pay | Admitting: Family

## 2022-04-15 ENCOUNTER — Other Ambulatory Visit: Payer: Self-pay | Admitting: Family

## 2022-04-15 ENCOUNTER — Ambulatory Visit: Payer: Medicare Other | Admitting: Physical Medicine and Rehabilitation

## 2022-04-15 ENCOUNTER — Encounter: Payer: Self-pay | Admitting: Physical Medicine and Rehabilitation

## 2022-04-15 VITALS — BP 110/60 | HR 94 | Temp 97.6°F | Ht 65.0 in | Wt 183.0 lb

## 2022-04-15 DIAGNOSIS — R972 Elevated prostate specific antigen [PSA]: Secondary | ICD-10-CM

## 2022-04-15 DIAGNOSIS — I10 Essential (primary) hypertension: Secondary | ICD-10-CM

## 2022-04-15 DIAGNOSIS — E538 Deficiency of other specified B group vitamins: Secondary | ICD-10-CM | POA: Diagnosis not present

## 2022-04-15 DIAGNOSIS — R7989 Other specified abnormal findings of blood chemistry: Secondary | ICD-10-CM

## 2022-04-15 LAB — COMPREHENSIVE METABOLIC PANEL
ALT: 9 U/L (ref 0–53)
AST: 18 U/L (ref 0–37)
Albumin: 3.2 g/dL — ABNORMAL LOW (ref 3.5–5.2)
Alkaline Phosphatase: 104 U/L (ref 39–117)
BUN: 52 mg/dL — ABNORMAL HIGH (ref 6–23)
CO2: 21 mEq/L (ref 19–32)
Calcium: 8.7 mg/dL (ref 8.4–10.5)
Chloride: 103 mEq/L (ref 96–112)
Creatinine, Ser: 2.41 mg/dL — ABNORMAL HIGH (ref 0.40–1.50)
GFR: 25.07 mL/min — ABNORMAL LOW (ref >60.00)
Glucose, Bld: 102 mg/dL — ABNORMAL HIGH (ref 70–99)
Potassium: 3.6 mEq/L (ref 3.5–5.1)
Sodium: 137 mEq/L (ref 135–145)
Total Bilirubin: 0.4 mg/dL (ref 0.2–1.2)
Total Protein: 7 g/dL (ref 6.0–8.3)

## 2022-04-15 LAB — PSA: PSA: 21.51 ng/mL — ABNORMAL HIGH (ref 0.10–4.00)

## 2022-04-15 LAB — VITAMIN B12: Vitamin B-12: 252 pg/mL (ref 211–911)

## 2022-04-15 MED ORDER — CITALOPRAM HYDROBROMIDE 20 MG PO TABS: 20.0000 mg | ORAL_TABLET | Freq: Every day | ORAL | 1 refills | Status: DC

## 2022-04-15 MED ORDER — AMLODIPINE BESYLATE 10 MG PO TABS
10.0000 mg | ORAL_TABLET | Freq: Every day | ORAL | 1 refills | Status: DC
Start: 2022-04-15 — End: 2023-01-05

## 2022-04-15 MED ORDER — COLCHICINE 0.6 MG PO TABS: 0.6000 mg | ORAL_TABLET | Freq: Every day | ORAL | 5 refills | Status: DC

## 2022-04-15 NOTE — Progress Notes (Signed)
Andres White is a 76 y.o. 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 10/02/2020   Endotracheally intubated 09/16/2020   Community acquired pneumonia 09/16/2020   Cervical spondylosis 09/16/2020   Vertebral artery stenosis 09/16/2020   Status epilepticus ([place]) 09/16/2020   CKD (chronic kidney disease) stage 3, GFR 30-59 ml/min (HCC) 09/16/2020   Occult blood in stools    Leukocytosis    Hyponatremia    History of hypertension    Dysphagia, post-stroke    Dysesthesia    Neuropathic pain    Brainstem infarct, acute (HCC)    Acute blood loss anemia    AKI (acute kidney injury) ([place])    Stroke (cerebrum) ([place]) 06/27/2020   Right leg weakness    Left carotid stenosis 04/13/2014    Current Outpatient Medications  Medication Sig Dispense Refill   acetaminophen (TYLENOL) 325 MG tablet Take 1-2 tablets (325-650 mg total) by mouth every 4 (four) hours as needed for mild pain.     amLODipine (NORVASC) 10 MG tablet Take 1 tablet (10 mg total) by mouth daily. 90 tablet 1   aspirin EC 81 MG tablet Take 1 tablet (81 mg total) by mouth daily. (Patient taking differently: Take 81 mg by mouth in the morning.) 90 tablet 3   atorvastatin (LIPITOR) 40 MG tablet TAKE 1 TABLET BY MOUTH ONCE DAILY 30 tablet 5   cilostazol (PLETAL) 50 MG tablet Take 1 tablet (50 mg total) by mouth 2 (two) times daily. 60 tablet 3   furosemide (LASIX) 20 MG tablet TAKE 1 TABLET BY MOUTH EVERY MORNING 30 tablet 5   gabapentin (NEURONTIN) 300 MG capsule TAKE 1 CAPSULE BY MOUTH THREE TIMES DAILY 90 capsule 5   hydrocerin (EUCERIN) CREA Apply 1 application.  topically 2 (two) times daily. 454 g 1   Multiple Vitamin (MULTIVITAMIN WITH MINERALS) TABS tablet Take 1 tablet by mouth daily. (Patient taking differently: Take 1 tablet by mouth daily at 12 noon.)     pantoprazole (PROTONIX) 40 MG tablet TAKE 1 TABLET BY MOUTH TWICE DAILY 60 tablet 5   QUEtiapine (SEROQUEL) 50 MG tablet Take 1 tablet (50 mg total) by mouth at bedtime. 90 tablet 3   tamsulosin (FLOMAX) 0.4 MG CAPS capsule Take 2 capsules (0.8 mg total) by mouth daily after supper. 90 capsule 1   vitamin B-12 (CYANOCOBALAMIN) 1000 MCG tablet Take 1,000 mcg by mouth daily.     citalopram (CELEXA) 20 MG tablet Take 1 tablet (20 mg total) by mouth daily. 90 tablet 1   colchicine 0.6 MG tablet Take 1 tablet (0.6 mg total) by mouth daily. As needed 30 tablet 5   levETIRAcetam (KEPPRA) 500 MG tablet Take 500 mg by mouth 2 (two) times daily.     No current facility-administered medications for this visit.    Allergies: Patient has no known allergies.  Past Medical History:  Diagnosis Date   Arthritis    Chronic back pain    Duodenal adenoma    Gout    Hypertension    Stroke [facility]) 06/2020   Syncope and collapse     Past Surgical History:  Procedure Laterality Date  BIOPSY  07/13/2020   Procedure: BIOPSY;  Surgeon: Ladene Artist, MD;  Location: [place];  Service: Endoscopy;;   BIOPSY  11/28/2020   Procedure: BIOPSY;  Surgeon: Irving Copas., MD;  Location: [facility];  Service: Gastroenterology;;   CARDIAC CATHETERIZATION  2015   [facility]    COLONOSCOPY WITH PROPOFOL N/A 07/13/2020   Procedure: COLONOSCOPY WITH PROPOFOL;  Surgeon: Ladene Artist, MD;  Location[facility] ENDOSCOPY;  Service: Endoscopy;  Laterality: N/A;   COLONOSCOPY WITH PROPOFOL N/A 11/28/2020   Procedure: COLONOSCOPY WITH PROPOFOL;  Surgeon: Rush Landmark Telford Nab., MD;  Location: [facility] ENDOSCOPY;  Service: Gastroenterology;  Laterality: N/A;   ENDOSCOPIC MUCOSAL RESECTION N/A 11/28/2020   Procedure: ENDOSCOPIC  MUCOSAL RESECTION;  Surgeon: Rush Landmark Telford Nab., MD;  Location: [facility] ENDOSCOPY;  Service: Gastroenterology;  Laterality: N/A;   ESOPHAGOGASTRODUODENOSCOPY N/A 07/13/2020   Procedure: ESOPHAGOGASTRODUODENOSCOPY (EGD);  Surgeon: Ladene Artist, MD;  Location[facility] ENDOSCOPY;  Service: Endoscopy;  Laterality: N/A;   ESOPHAGOGASTRODUODENOSCOPY (EGD) WITH PROPOFOL N/A 11/28/2020   Procedure: ESOPHAGOGASTRODUODENOSCOPY (EGD) WITH PROPOFOL;  Surgeon: Rush Landmark Telford Nab., MD;  Location: [facility] ENDOSCOPY;  Service: Gastroenterology;  Laterality: N/A;   HEMOSTASIS CLIP PLACEMENT  11/28/2020   Procedure: HEMOSTASIS CLIP PLACEMENT;  Surgeon: Irving Copas., MD;  Location: [facility] ENDOS[facility];  Service: Gastroenterology;;   KNEE SURGERY Right    POLYPECTOMY  07/13/2020   Procedure: POLYPECTOMY;  Surgeon: Ladene Artist, MD;  Location: [facility] ENDOSCOPY;  Service: Endoscopy;;   POLYPECTOMY  11/28/2020   Procedure: POLYPECTOMY;  Surgeon: Irving Copas., MD;  Location: [facility]OS[facility];  Service: Gastroenterology;;   SUBMUCOSAL LIFTING INJECTION  11/28/2020   Procedure: SUBMUCOSAL LIFTING INJECTION;  Surgeon: Irving Copas., MD;  Location: [facility]OS[facility];  Service: Gastroenterology;;   SUBMUCOSAL TATTOO INJECTION  11/28/2020   Procedure: SUBMUCOSAL TATTOO INJECTION;  Surgeon: Irving Copas., MD;  Location: [facility] ENDOSCOPY;  Service: Gastroenterology;;    Family History  Problem Relation Age of Onset   Hypertension Mother    Colon cancer Neg Hx    Pancreatic cancer Neg Hx    Stomach cancer Neg Hx    Esophageal cancer Neg Hx    Inflammatory bowel disease Neg Hx    Liver disease Neg Hx    Rectal cancer Neg Hx     Social History   Tobacco Use   Smoking status: Every Day    Packs/day: 1.00    Years: 20.00    Total pack years: 20.00    Types: Cigarettes    Passive exposure: Current   Smokeless tobacco: Never   Tobacco comments:    Verified by Daughter - [name]  Substance Use  Topics   Alcohol use: Not Currently    Comment: occasional    Subjective:   4 month follow up on chronic care needs- accompanied by his daughter; she is very concerned that he is taking too much medication/ would like to do a medication review; She feels he is very tired/ more so than normal recently;  Majority of visit is done with daughter- patient notes he is fine with this arrangement;     Objective:  Vitals:   04/15/22 0909  BP: 110/60  Pulse: 94  Temp: 97.6 F (36.4 C)  TempSrc: Oral  SpO2: 92%  Weight: 183 lb (83 kg)  Height: 5' 5" (1.651 m)    General: Well developed, well nourished, in no acute distress  Skin : Warm and dry.  Head: Normocephalic and atraumatic  Lungs: Respirations unlabored; clear  to auscultation bilaterally without wheeze, rales, rhonchi  CVS exam: normal rate and regular rhythm.  Extremities: No edema, cyanosis, clubbing  Vessels: Symmetric bilaterally  Neurologic: Alert and oriented; speech intact; face symmetrical; in wheelchair  Assessment:  1. Low vitamin B12 level   2. Primary hypertension   3. Elevated PSA     Plan:  Re-check B12 level today; Patient was recommended to change to Amlodipine and d/c Lisinopril HCT per Dr. Aline August note from July 2023; this did not happen though- will update Rx; plan for blood pressure check with Dr. Ranell Patrick in October at that follow up; Daughter did not understand to have her father see urology as discussed previously this year; will repeat PSA and re-do urology referral;   Extensive medication review done; have asked daughter to let Dr. Ranell Patrick know that may be having excessive drowsiness with increased Gabapentin and clarify if Colchicine really needs to be daily- typically this is prn. Daughter defers having chronic care management referral updated today;   Time spent 35+ minutes  No follow-ups on file.  Orders Placed This Encounter  Procedures   B12   Comp Met (CMET)   PSA   Ambulatory  referral to Urology    Referral Priority:   Routine    Referral Type:   Consultation    Referral Reason:   Specialty Services Required    Requested Specialty:   Urology    Number of Visits Requested:   1    Requested Prescriptions   Signed Prescriptions Disp Refills   citalopram (CELEXA) 20 MG tablet 90 tablet 1    Sig: Take 1 tablet (20 mg total) by mouth daily.   amLODipine (NORVASC) 10 MG tablet 90 tablet 1    Sig: Take 1 tablet (10 mg total) by mouth daily.   colchicine 0.6 MG tablet 30 tablet 5    Sig: Take 1 tablet (0.6 mg total) by mouth daily. As needed

## 2022-04-15 NOTE — Patient Instructions (Addendum)
Please call Dr. Alfonso Patten to ask questions about fatigue with Gabapentin ( Neurontin); ? If Colchicine is needed everyday? This is typically used as needed for a gout flare

## 2022-04-16 ENCOUNTER — Other Ambulatory Visit: Payer: Medicare Other | Admitting: *Deleted

## 2022-04-17 ENCOUNTER — Ambulatory Visit: Payer: Medicare Other | Admitting: Neurology

## 2022-04-17 DIAGNOSIS — I69398 Other sequelae of cerebral infarction: Secondary | ICD-10-CM

## 2022-04-17 DIAGNOSIS — R569 Unspecified convulsions: Secondary | ICD-10-CM

## 2022-04-22 ENCOUNTER — Other Ambulatory Visit (INDEPENDENT_AMBULATORY_CARE_PROVIDER_SITE_OTHER): Payer: Medicare Other

## 2022-04-22 DIAGNOSIS — R7989 Other specified abnormal findings of blood chemistry: Secondary | ICD-10-CM

## 2022-04-22 LAB — COMPREHENSIVE METABOLIC PANEL
ALT: 12 U/L (ref 0–53)
AST: 19 U/L (ref 0–37)
Albumin: 3.3 g/dL — ABNORMAL LOW (ref 3.5–5.2)
Alkaline Phosphatase: 111 U/L (ref 39–117)
BUN: 22 mg/dL (ref 6–23)
CO2: 21 mEq/L (ref 19–32)
Calcium: 8.6 mg/dL (ref 8.4–10.5)
Chloride: 105 mEq/L (ref 96–112)
Creatinine, Ser: 1.63 mg/dL — ABNORMAL HIGH (ref 0.40–1.50)
GFR: 40.07 mL/min — ABNORMAL LOW (ref >60.00)
Glucose, Bld: 89 mg/dL (ref 70–99)
Potassium: 3.8 mEq/L (ref 3.5–5.1)
Sodium: 136 mEq/L (ref 135–145)
Total Bilirubin: 0.4 mg/dL (ref 0.2–1.2)
Total Protein: 7 g/dL (ref 6.0–8.3)

## 2022-05-02 ENCOUNTER — Other Ambulatory Visit: Payer: Self-pay | Admitting: Urology

## 2022-05-02 DIAGNOSIS — R972 Elevated prostate specific antigen [PSA]: Secondary | ICD-10-CM

## 2022-05-02 DIAGNOSIS — Z125 Encounter for screening for malignant neoplasm of prostate: Secondary | ICD-10-CM

## 2022-05-09 ENCOUNTER — Other Ambulatory Visit: Payer: Self-pay | Admitting: Physical Medicine and Rehabilitation

## 2022-05-27 ENCOUNTER — Encounter: Payer: Medicare Other | Admitting: Physical Medicine and Rehabilitation

## 2022-06-10 ENCOUNTER — Ambulatory Visit
Admission: RE | Admit: 2022-06-10 | Discharge: 2022-06-10 | Disposition: A | Discharge status: Home / Self Care | Payer: Medicare Other | Source: Ambulatory Visit | Attending: Urology | Admitting: Urology

## 2022-06-10 DIAGNOSIS — R972 Elevated prostate specific antigen [PSA]: Secondary | ICD-10-CM

## 2022-06-10 DIAGNOSIS — Z125 Encounter for screening for malignant neoplasm of prostate: Secondary | ICD-10-CM

## 2022-06-10 MED ORDER — GADOPICLENOL 0.5 MMOL/ML IV SOLN
9.0000 mL | Freq: Once | INTRAVENOUS | Status: AC | PRN
  Administered 2022-06-10: 9 mL via INTRAVENOUS

## 2022-06-18 ENCOUNTER — Other Ambulatory Visit: Payer: Self-pay | Admitting: Physical Medicine and Rehabilitation

## 2022-06-18 ENCOUNTER — Other Ambulatory Visit: Payer: Self-pay | Admitting: Adult Health

## 2022-06-18 DIAGNOSIS — I69398 Other sequelae of cerebral infarction: Secondary | ICD-10-CM

## 2022-06-24 ENCOUNTER — Telehealth: Payer: Self-pay | Admitting: Adult Health

## 2022-06-24 NOTE — Telephone Encounter (Signed)
Andres White is callling from [facility] [facility] [employer]. Said she is trying to refill a prescription for medication   QUEtiapine (SEROQUEL) 50 MG tablet [[id number]]. Stated she received a message stated its not appropriate.Andres White is  trying to fill pt prescription requesting a call-back.

## 2022-06-24 NOTE — Telephone Encounter (Signed)
This medication has not been prescribed by [place] for over a year. Looks like pcp and another MD have been involved with ordering this script for the patient. If they call back please advise this is not ordered through [place].

## 2022-06-30 ENCOUNTER — Telehealth: Payer: Self-pay | Admitting: Adult Health

## 2022-06-30 NOTE — Telephone Encounter (Signed)
[facility]) would like a call back to verify if pt Phenytoin Sodium Extended 100 mg has been discontinued or sent to  another pharmacy.

## 2022-06-30 NOTE — Telephone Encounter (Signed)
Called [facility] back at the number provided. There was no answer. LVM asking for a call back.   **If [facility] calls back please advise that according to the June appt, the patient was suppose to wean off the medication. There has not been any update from the patient since then.   "Decrease Dilantin to '100mg'$  twice daily for 30 days then decrease to '100mg'$  once daily for 30 days then stop We will plan on repeating an EEG in approx 3 months to ensure you are not having any "silent" seizures once we stop Dilantin"

## 2022-07-04 ENCOUNTER — Other Ambulatory Visit (HOSPITAL_COMMUNITY): Payer: Self-pay | Admitting: Urology

## 2022-07-04 DIAGNOSIS — R972 Elevated prostate specific antigen [PSA]: Secondary | ICD-10-CM

## 2022-07-09 ENCOUNTER — Other Ambulatory Visit: Payer: Self-pay | Admitting: Physical Medicine and Rehabilitation

## 2022-07-22 NOTE — Progress Notes (Deleted)
[facility] [address] [place] [address] [phone]       OFFICE FOLLOW UP VISIT NOTE  Mr. BEACHER EVERY Date of Birth:  11-Mar-1946 Medical Record Number:  [id number]   Referring MD: Reesa Chew, PA-C Reason for Referral: Stroke, seizures and dementia    No chief complaint on file.    HPI:   Update 07/23/2022 JM: Patient returns for 45-monthfollow-up.   Stable from stroke standpoint.  Denies new stroke/TIA symptoms.  Residual right-sided weakness and cognitive impairment stable.  Use of RW at all times, no falls.  Remains on aspirin and atorvastatin.  Blood pressure ***.  Routinely follows with PCP.  Denies any seizure activity, remains on Keppra 500 mg BID. Has since stopped Dilantin.  Reports stable cognition.  MMSE today ***/30 (prior 11/30).  Daughter continues to provide 24/7 supervision and assistance for ADLs and IADLs.       History provided for reference purposes only Update 01/20/2022 JM: Patient returns for stroke, seizure and dementia follow-up after prior visit almost 1 year ago (requested 470-monthollow-up).  He is accompanied by his daughter who provides majority of history.  Stable from stroke standpoint, denies new stroke/TIA symptoms.  Residual right-sided weakness and cognitive impairment stable. Will use RW at all times, no recent falls.  Compliant on aspirin and atorvastatin, denies side effects.  Blood pressure today 150/82.   Cognition has been stable since prior visit.  MMSE today 11/30 (prior 11/30).  Continues to live with his daughter who provides 24/7 supervision and assistance for majority of ADLs.  Recent lab work with PCP showed low B12 at 176 and was started on oral B12 supplement.  Denies any seizure activity.  Discussed weaning off of Dilantin at prior visit but he has since remained on both Keppra and Dilantin due to miscommunication.  Denies side effects on medications.  He has since establish care with  new PCP, he was referred to urologist due to elevated PSA.  Daughter also concerned regarding frequent urination, questions if this is due to furosemide.  This is currently being managed by cardiology which was started back in 10/2020 for edema.  Daughter was encouraged to ensure evaluation with urology scheduled for further discussion as well as with cardiology for furosemide management  No further concerns at this time  Update 02/25/2021 JM: Mr. SeGiengereturns for 3-64-monthroke and seizure follow-up accompanied by his daughter who provides history.  He has been stable since prior visit without new stroke/TIA symptoms or seizure activity.  Reports residual right sided weakness and cognitive impairment- [facility] but stopped mid Spring due to short staffing.  He does try to stay active at home but not necessarily any type of routine physical or mental exercises.  He ambulates with rolling walker - did have 1 fall when he was not using walker thankfully without injury.  He continues to live with his daughter who provides 24/7 supervision as well as assistance for ADLs.  Cognition has been generally stable but fluctuates per daughter.  On gabapentin 300 mg TID per PMR Dr. RauRanell Patrickr neuropathy pain.  Compliant on aspirin and atorvastatin without associated side effects.  Blood pressure today 151/87.  Compliant on Keppra and Dilantin tolerating without side effects.  No further concerns at this time.  Update 11/08/2020 Dr. SetLeonie Mane returns for follow-up after last visit 3 months ago.  Patient is referred back to see me because of new problems of seizures which required  admission to [facility] on 09/16/2020.  Patient will at home when family witnessed that he was not acting his usual self and was gazing upwards.  When EMS arrived they noticed a left gaze deviation and unresponsiveness to voice and following commands and possibly some left-sided weakness.  Patient was initially brought  in as a code stroke but while being evaluated was noticed is likely having a seizure because he had tonic gaze deviation with a cry and stiffening of the whole body followed by generalized shaking which lasted about a minute.  He was given IV Ativan which stopped the episode.  He was also loaded with IV Keppra.  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  EEG done on 09/17/2020 and 09/18/2020 showed moderate diffuse encephalopathy there were several episodes of even better being pushed by the staff when they noticed tremulous movements on the right but these did not have any electrographic correlates for seizures.  Patient was intubated and IV phenytoin was added to the Keppra with the got better and self extubated himself.  He finished long-term EEG monitoring which showed no definite seizure activity.  MRI scan of the brain on 09/20/2020 was motion degraded but showed no acute abnormality.  Showed evidence of old infarcts bilaterally.  Patient did have some cognitive impairment following his previous stroke when the daughter feels that this has gotten worse now.  He requires constant supervision.  He cannot be left alone.  He is able to walk with a walker but he is disoriented at baseline and requires help with most activities of daily living.  He has had no recurrent seizures of focal jerking since his being home.  He is still on the current dosages of Keppra finder milligram twice daily and phenytoin 100 mg   3 times a day.  Is had no recurrent stroke or TIA symptoms and remains on aspirin she is tolerating well.  Blood pressure is well controlled today it is slightly elevated 160/82.  Is tolerating Lipitor well without muscle aches and pains.  Initial visit 08/30/2020 Dr. Leonie Man: Mr. [name] is a 76 year old African-American male seen today for initial office  consultation visit for stroke.  Is accompanied by his daughter who provides history.  I also reviewed electronic medical records and imaging films in PACS.  He has a past medical history of hypertension, tobacco abuse, arthritis who presented to [facility] on 06/[facility]/2020 with right leg weakness for 4 days prior to admission.  He was found to have mild right hand and more leg weakness.  MRI scan of the brain showed a left midbrain lacunar infarct.  Carotid ultrasound suggested moderate 70 to 90% left ICA stenosis which is asymptomatic.  Echocardiogram showed normal ejection fraction without cardiac source of embolism.  LDL cholesterol was 32 mg percent.  Hemoglobin A1c was 4.8.  Lower extremity venous Dopplers were negative.  Patient subsequently had 2 weeks external cardiac monitor done which was negative for paroxysmal A. fib.  Patient was started on aspirin 81 mg daily which he is tolerating well without bruising or bleeding.  He went to inpatient rehab for 4 weeks and subsequently is at home living with her daughter.  Is able to ambulate independently now though he still has some diminished fine motor skills and mild right leg dragging.  His speech is improved.  He however  has significant memory loss and cognitive impairment following his stroke as per the daughter.  He is quite forgetful and cannot remember recent conversations and needs constant reminders.  He was previously living independently and managing his own affairs but daughter feels he is no longer able to do so.  Patient states he is cut back smoking significantly but still smokes a few cigarettes a day.  His blood pressure is well controlled and today it is 142/86.  There is no family history of dementia.  Patient has no prior history of seizures or significant neurological problems.   ROS:   14 system review of systems is positive for those listed in HPI and all other systems negative.     PMH:  Past Medical History:   Diagnosis Date   Arthritis    Chronic back pain    Duodenal adenoma    Gout    Hypertension    Stroke ([place]) 06/2020   Syncope and collapse     Social History:  Social History   Socioeconomic History   Marital status: Single    Spouse name: Not on file   Number of children: 1   Years of education: 12   Highest education level: 12th grade  Occupational History   Not on file  Tobacco Use   Smoking status: Every Day    Packs/day: 1.00    Years: 20.00    Total pack years: 20.00    Types: Cigarettes    Passive exposure: Current   Smokeless tobacco: Never   Tobacco comments:    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, drilling  Vaping Use   Vaping Use: Never used  Substance and Sexual Activity   Alcohol use: Not Currently    Comment: occasional   Drug use: No   Sexual activity: Not Currently  Other Topics Concern   Not on file  Social History Narrative   Lives with daughter, son in law and 2 grandchildren   Right Handed   Drinks 1 cup caffeine 4 times a week-ish   Social Determinants of Health   Financial Resource Strain: Low Risk  (03/31/2022)   Overall Financial Resource Strain (CARDIA)    Difficulty of Paying Living Expenses: Not hard at all  Food Insecurity: No Food Insecurity (03/31/2022)   Hunger Vital Sign    Worried About Running Out of Food in the Last Year: Never true    Ran Out of Food in the Last Year: Never true  Transportation Needs: No Transportation Needs (12/11/2021)   PRAPARE - Hydrologist (Medical): No    Lack of Transportation (Non-Medical): No  Physical Activity: Inactive (12/11/2021)   Exercise Vital Sign    Days of Exercise per Week: 0 days    Minutes of Exercise per Session: 0 min  Stress: No Stress Concern Present (12/11/2021)   [place]    Feeling of Stress : Only a little  Social Connections: Socially Isolated (03/31/2022)   Social Connection and  Isolation Panel [NHANES]    Frequency of Communication with Friends and Family: More than three times a week    Frequency of Social Gatherings with Friends and Family: More than three times a week    Attends Religious Services: Never    Marine scientist or Organizations: No    Attends Archivist Meetings: Never    Marital Status: Divorced  Human resources officer Violence: Not At Risk (03/31/2022)   Humiliation,  Afraid, Rape, and Kick questionnaire    Fear of Current or Ex-Partner: No    Emotionally Abused: No    Physically Abused: No    Sexually Abused: No    Medications:   Current Outpatient Medications on File Prior to Visit  Medication Sig Dispense Refill   acetaminophen (TYLENOL) 325 MG tablet Take 1-2 tablets (325-650 mg total) by mouth every 4 (four) hours as needed for mild pain.     allopurinol (ZYLOPRIM) 100 MG tablet TAKE 1 TABLET(100 MG) BY MOUTH DAILY 90 tablet 0   amLODipine (NORVASC) 10 MG tablet Take 1 tablet (10 mg total) by mouth daily. 90 tablet 1   aspirin EC 81 MG tablet Take 1 tablet (81 mg total) by mouth daily. (Patient taking differently: Take 81 mg by mouth in the morning.) 90 tablet 3   atorvastatin (LIPITOR) 40 MG tablet TAKE 1 TABLET BY MOUTH ONCE DAILY 30 tablet 5   cilostazol (PLETAL) 50 MG tablet TAKE 1 TABLET BY MOUTH TWICE DAILY 60 tablet 8   citalopram (CELEXA) 20 MG tablet Take 1 tablet (20 mg total) by mouth daily. 90 tablet 1   colchicine 0.6 MG tablet Take 1 tablet (0.6 mg total) by mouth daily. As needed 30 tablet 5   furosemide (LASIX) 20 MG tablet TAKE 1 TABLET BY MOUTH EVERY MORNING 30 tablet 5   gabapentin (NEURONTIN) 300 MG capsule TAKE 1 CAPSULE BY MOUTH THREE TIMES DAILY 90 capsule 5   hydrocerin (EUCERIN) CREA Apply 1 application. topically 2 (two) times daily. 454 g 1   levETIRAcetam (KEPPRA) 500 MG tablet Take 500 mg by mouth 2 (two) times daily.     Multiple Vitamin (MULTIVITAMIN WITH MINERALS) TABS tablet Take 1 tablet by mouth  daily. (Patient taking differently: Take 1 tablet by mouth daily at 12 noon.)     pantoprazole (PROTONIX) 40 MG tablet TAKE 1 TABLET BY MOUTH TWICE DAILY 60 tablet 5   QUEtiapine (SEROQUEL) 50 MG tablet Take 1 tablet (50 mg total) by mouth at bedtime. 90 tablet 3   tamsulosin (FLOMAX) 0.4 MG CAPS capsule TAKE 2 CAPSULES BY MOUTH ONCE DAILY AFTER SUPPER 60 capsule 8   vitamin B-12 (CYANOCOBALAMIN) 1000 MCG tablet Take 1,000 mcg by mouth daily.     No current facility-administered medications on file prior to visit.    Allergies:  No Known Allergies  Physical Exam There were no vitals filed for this visit.   There is no height or weight on file to calculate BMI.   General: Frail very pleasant elderly African-American male, seated, in no evident distress Head: head normocephalic and atraumatic.   Neck: supple with no carotid or supraclavicular bruits Cardiovascular: regular rate and rhythm, no murmurs Musculoskeletal: no deformity Skin:  no rash/petichiae; 1+ BLE edema Vascular:  Normal pulses all extremities  Neurologic Exam Mental Status: Awake and fully alert.  Unable to appreciate dysarthria or aphasia.  Able to follow commands without difficulty.    01/20/2022   11:04 AM 02/25/2021    8:43 AM 08/30/2020   10:38 AM  MMSE - Mini Mental State Exam  Orientation to time 0 1 2  Orientation to Place '3 3 3  '$ Registration '3 3 3  '$ Attention/ Calculation 0 0 0  Recall '1 3 1  '$ Language- name 2 objects '1 1 2  '$ Language- repeat 0 0 0  Language- follow 3 step command 3 0 3  Language- read & follow direction 0 0 0  Write a sentence 0 0  0  Copy design 0 0 0  Total score '11 11 14   '$ Cranial Nerves: Pupils equal, briskly reactive to light. Extraocular movements full without nystagmus. Visual fields full to confrontation. Hearing intact. Facial sensation intact. Face, tongue, palate moves normally and symmetrically.  Motor: Normal bulk and tone. Normal strength in all tested extremity muscles  except mild right grip weakness and diminished fine finger movements on the right and orbits left over right upper extremity and right ankle dorsiflexion and plantarflexion weakness Sensory.: intact to touch , pinprick , position and vibratory sensation.  Coordination: Rapid alternating movements normal on leftside. Finger-to-nose and heel-to-shin performed accurately on left side with incoordination right upper and lower extremity Gait and Station: Arises from chair with difficulty. Stance is stooped.  Using a walker.  Gait demonstrates slight dragging of the right leg and unsteadiness.  Tandem walk and heel toe not attempted Reflexes: 1+ and symmetric. Toes downgoing.        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  He has post stroke residual mild right hemiparesis as well as dementia and memory loss following his stroke which appears to have worsened after recent admission for seizures in February 2022.     Left lacunar infarct:  Residual deficit: Right hemiparesis and cognitive impairment. Stable. Discussed use of RW at all times for fall prevention. Continue aspirin 81 mg daily  and atorvastatin for secondary stroke prevention.  Close PCP f/u for aggressive stroke risk factor management including BP goal<130/90, and HLD with LDL goal<70  Vascular dementia: MMSE 11/30 (prior 11/30). Discussed importance of routine memory exercises at home as well as routine physical exercise (as tolerated), healthy diet, adequate sleep and management of risk factors.  Daughter continues to provide 24/7 supervision and care  Seizures:  Stable without new seizure activity.   Continue Keppra 500 mg twice daily  per Dr. Clydene Fake recommendations, will plan on slowly decreasing Dilantin dosage as he has been stable - recommend decreasing dosage from 100 mg 3  times daily to 100 mg twice daily for 30 days then 100 mg daily for 30 days then discontinue.  Will plan on repeating EEG around 04/2022 Discussed possible seizures with weaning off from Dilantin and signs/symptoms to observe for and advised to call office with any potential seizure activity    Follow-up in 6 months or call earlier if needed   CC:  Marrian Salvage, FNP   I spent 38 minutes of face-to-face and non-face-to-face time with patient and daughter.  This included previsit chart review, lab review, study review, order entry, electronic health record documentation, patient and daughter education and discussion regarding history of prior stroke as well as secondary stroke prevention measures and aggressive stroke risk factor management, residual deficits, seizures and use of AED as well as discontinuing Dilantin, vascular dementia with review and discussion of MMSE and answered all other questions to patient and daughters satisfaction  Frann Rider, [facility]  [facility] [address] [place], [place] [address]  Phone [phone] Fax [phone] Note: This document was prepared with digital dictation and possible smart phrase technology. Any transcriptional errors that result from this process are unintentional.

## 2022-07-23 ENCOUNTER — Ambulatory Visit: Payer: Medicare Other | Admitting: Adult Health

## 2022-07-23 ENCOUNTER — Encounter: Payer: Self-pay | Admitting: Family Medicine

## 2022-07-29 ENCOUNTER — Ambulatory Visit: Payer: Medicare Other | Admitting: Adult Health

## 2022-07-31 NOTE — Progress Notes (Signed)
[facility] [address] [place] [address] [phone]       OFFICE FOLLOW UP VISIT NOTE  [name] Date of Birth:  1946/07/21 Medical Record Number:  [id number]   Referring MD: Reesa Chew, PA-C Reason for Referral: Stroke, seizures and dementia    Chief Complaint  Patient presents with   Follow-up    Patient in room #2 with his daughter. Pt here today to f/u memory loss.     HPI:   Update 08/05/2022 JM: Patient returns for 6-monthfollow-up.  Daughter reports episode of slurred speech and left hand numbness about 1 month upon awakening that lasted about 30 seconds then completely resolved. Daughter unsure if symptoms from just waking up or sleeping on hand wrong. Patient declined going to ED at that time. Per daughter, he was able to answer questions and follow commands. Did not lose consciousness.  Denies any weakness, visual changes or cognitive changes. No reoccuring or new stroke/TIA symptoms since that time.    Residual right-sided weakness and cognitive impairment.  Use of RW at all times, no falls. Daughter reports some mild gradual decline in memory since last visit. MMSE today 13/30 (prior 11/30). Daughter continues to provide 24/7 supervision and assistance for ADLs and IADLs.  Remains on aspirin and atorvastatin.  Blood pressure today 158/93.  Routinely follows with PCP. Has f/u next week, plans on repeat lab work.   Denies any seizure activity, remains on Keppra 500 mg BID. Has since stopped Dilantin.  No further concerns at this time    History provided for reference purposes only Update 01/20/2022 JM: Patient returns for stroke, seizure and dementia follow-up after prior visit almost 1 year ago (requested 428-monthollow-up).  He is accompanied by his daughter who provides majority of history.  Stable from stroke standpoint, denies new stroke/TIA symptoms.  Residual right-sided weakness and cognitive impairment stable. Will use  RW at all times, no recent falls.  Compliant on aspirin and atorvastatin, denies side effects.  Blood pressure today 150/82.   Cognition has been stable since prior visit.  MMSE today 11/30 (prior 11/30).  Continues to live with his daughter who provides 24/7 supervision and assistance for majority of ADLs.  Recent lab work with PCP showed low B12 at 176 and was started on oral B12 supplement.  Denies any seizure activity.  Discussed weaning off of Dilantin at prior visit but he has since remained on both Keppra and Dilantin due to miscommunication.  Denies side effects on medications.  He has since establish care with new PCP, he was referred to urologist due to elevated PSA.  Daughter also concerned regarding frequent urination, questions if this is due to furosemide.  This is currently being managed by cardiology which was started back in 10/2020 for edema.  Daughter was encouraged to ensure evaluation with urology scheduled for further discussion as well as with cardiology for furosemide management  No further concerns at this time  Update 02/25/2021 JM: Mr. SeNatzkeeturns for 3-23-monthroke and seizure follow-up accompanied by his daughter who provides history.  He has been stable since prior visit without new stroke/TIA symptoms or seizure activity.  Reports residual right sided weakness and cognitive impairment- [facility] but stopped mid Spring due to short staffing.  He does try to stay active at home but not necessarily any type of routine physical or mental exercises.  He ambulates with rolling walker - did have 1 fall when he was  not using walker thankfully without injury.  He continues to live with his daughter who provides 24/7 supervision as well as assistance for ADLs.  Cognition has been generally stable but fluctuates per daughter.  On gabapentin 300 mg TID per PMR Dr. Ranell Patrick for neuropathy pain.  Compliant on aspirin and atorvastatin without associated side effects.   Blood pressure today 151/87.  Compliant on Keppra and Dilantin tolerating without side effects.  No further concerns at this time.  Update 11/08/2020 Dr. Leonie Man: He returns for follow-up after last visit 3 months ago.  Patient is referred back to see me because of new problems of seizures which required admission to [facility] on 09/16/2020.  Patient will at home when family witnessed that he was not acting his usual self and was gazing upwards.  When EMS arrived they noticed a left gaze deviation and unresponsiveness to voice and following commands and possibly some left-sided weakness.  Patient was initially brought in as a code stroke but while being evaluated was noticed is likely having a seizure because he had tonic gaze deviation with a cry and stiffening of the whole body followed by generalized shaking which lasted about a minute.  He was given IV Ativan which stopped the episode.  He was also loaded with IV Keppra.  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  EEG done on 09/17/2020 and 09/18/2020 showed moderate diffuse encephalopathy there were several episodes of even better being pushed by the staff when they noticed tremulous movements on the right but these did not have any electrographic correlates for seizures.  Patient was intubated and IV phenytoin was added to the Keppra with the got better and self extubated himself.  He finished long-term EEG monitoring which showed no definite seizure activity.  MRI scan of the brain on 09/20/2020 was motion degraded but showed no acute abnormality.  Showed evidence of old infarcts bilaterally.  Patient did have some cognitive impairment following his previous stroke when the daughter feels that this has gotten worse now.  He requires constant supervision.  He cannot be left alone.  He is able to walk with a  walker but he is disoriented at baseline and requires help with most activities of daily living.  He has had no recurrent seizures of focal jerking since his being home.  He is still on the current dosages of Keppra finder milligram twice daily and phenytoin 100 mg   3 times a day.  Is had no recurrent stroke or TIA symptoms and remains on aspirin she is tolerating well.  Blood pressure is well controlled today it is slightly elevated 160/82.  Is tolerating Lipitor well without muscle aches and pains.  Initial visit 08/30/2020 Dr. Leonie Man: Mr. [name] is a 76 year old African-American male seen today for initial office consultation visit for stroke.  Is accompanied by his daughter who provides history.  I also reviewed electronic medical records and imaging films in PACS.  He has a past medical history of hypertension, tobacco abuse, arthritis who presented to [facility] on 06/25/2020 with right leg weakness for 4 days prior to admission.  He was found to have mild right hand and more leg weakness.  MRI scan of the brain showed a left midbrain lacunar infarct.  Carotid ultrasound suggested moderate 70 to 90% left ICA stenosis which is asymptomatic.  Echocardiogram  showed normal ejection fraction without cardiac source of embolism.  LDL cholesterol was 32 mg percent.  Hemoglobin A1c was 4.8.  Lower extremity venous Dopplers were negative.  Patient subsequently had 2 weeks external cardiac monitor done which was negative for paroxysmal A. fib.  Patient was started on aspirin 81 mg daily which he is tolerating well without bruising or bleeding.  He went to inpatient rehab for 4 weeks and subsequently is at home living with her daughter.  Is able to ambulate independently now though he still has some diminished fine motor skills and mild right leg dragging.  His speech is improved.  He however has significant memory loss and cognitive impairment following his stroke as per the daughter.  He is quite  forgetful and cannot remember recent conversations and needs constant reminders.  He was previously living independently and managing his own affairs but daughter feels he is no longer able to do so.  Patient states he is cut back smoking significantly but still smokes a few cigarettes a day.  His blood pressure is well controlled and today it is 142/86.  There is no family history of dementia.  Patient has no prior history of seizures or significant neurological problems.   ROS:   14 system review of systems is positive for those listed in HPI and all other systems negative.     PMH:  Past Medical History:  Diagnosis Date   Arthritis    Chronic back pain    Duodenal adenoma    Gout    Hypertension    Stroke ([place]) 06/2020   Syncope and collapse     Social History:  Social History   Socioeconomic History   Marital status: Single    Spouse name: Not on file   Number of children: 1   Years of education: 12   Highest education level: 12th grade  Occupational History   Not on file  Tobacco Use   Smoking status: Every Day    Packs/day: 1.00    Years: 20.00    Total pack years: 20.00    Types: Cigarettes    Passive exposure: Current   Smokeless tobacco: Never   Tobacco comments:    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, drilling  Vaping Use   Vaping Use: Never used  Substance and Sexual Activity   Alcohol use: Not Currently    Comment: occasional   Drug use: No   Sexual activity: Not Currently  Other Topics Concern   Not on file  Social History Narrative   Lives with daughter, son in law and 2 grandchildren   Right Handed   Drinks 1 cup caffeine 4 times a week-ish   Social Determinants of Health   Financial Resource Strain: Low Risk  (03/31/2022)   Overall Financial Resource Strain (CARDIA)    Difficulty of Paying Living Expenses: Not hard at all  Food Insecurity: No Food Insecurity (03/31/2022)   Hunger Vital Sign    Worried About Running Out of Food in the Last Year: Never  true    Ran Out of Food in the Last Year: Never true  Transportation Needs: No Transportation Needs (12/11/2021)   PRAPARE - Hydrologist (Medical): No    Lack of Transportation (Non-Medical): No  Physical Activity: Inactive (12/11/2021)   Exercise Vital Sign    Days of Exercise per Week: 0 days    Minutes of Exercise per Session: 0 min  Stress: No Stress Concern Present (12/11/2021)  [place] Questionnaire    Feeling of Stress : Only a little  Social Connections: Socially Isolated (03/31/2022)   Social Connection and Isolation Panel [NHANES]    Frequency of Communication with Friends and Family: More than three times a week    Frequency of Social Gatherings with Friends and Family: More than three times a week    Attends Religious Services: Never    Marine scientist or Organizations: No    Attends Archivist Meetings: Never    Marital Status: Divorced  Human resources officer Violence: Not At Risk (03/31/2022)   Humiliation, Afraid, Rape, and Kick questionnaire    Fear of Current or Ex-Partner: No    Emotionally Abused: No    Physically Abused: No    Sexually Abused: No    Medications:   Current Outpatient Medications on File Prior to Visit  Medication Sig Dispense Refill   acetaminophen (TYLENOL) 325 MG tablet Take 1-2 tablets (325-650 mg total) by mouth every 4 (four) hours as needed for mild pain.     allopurinol (ZYLOPRIM) 100 MG tablet TAKE 1 TABLET(100 MG) BY MOUTH DAILY 90 tablet 0   amLODipine (NORVASC) 10 MG tablet Take 1 tablet (10 mg total) by mouth daily. 90 tablet 1   aspirin EC 81 MG tablet Take 1 tablet (81 mg total) by mouth daily. (Patient taking differently: Take 81 mg by mouth in the morning.) 90 tablet 3   atorvastatin (LIPITOR) 40 MG tablet TAKE 1 TABLET BY MOUTH ONCE DAILY 30 tablet 5   cilostazol (PLETAL) 50 MG tablet TAKE 1 TABLET BY MOUTH TWICE DAILY 60 tablet 8    citalopram (CELEXA) 20 MG tablet Take 1 tablet (20 mg total) by mouth daily. 90 tablet 1   colchicine 0.6 MG tablet Take 1 tablet (0.6 mg total) by mouth daily. As needed 30 tablet 5   furosemide (LASIX) 20 MG tablet TAKE 1 TABLET BY MOUTH EVERY MORNING 30 tablet 5   gabapentin (NEURONTIN) 300 MG capsule TAKE 1 CAPSULE BY MOUTH THREE TIMES DAILY 90 capsule 5   hydrocerin (EUCERIN) CREA Apply 1 application. topically 2 (two) times daily. 454 g 1   levETIRAcetam (KEPPRA) 500 MG tablet Take 500 mg by mouth 2 (two) times daily.     Multiple Vitamin (MULTIVITAMIN WITH MINERALS) TABS tablet Take 1 tablet by mouth daily. (Patient taking differently: Take 1 tablet by mouth daily at 12 noon.)     pantoprazole (PROTONIX) 40 MG tablet TAKE 1 TABLET BY MOUTH TWICE DAILY 60 tablet 5   QUEtiapine (SEROQUEL) 50 MG tablet Take 1 tablet (50 mg total) by mouth at bedtime. 90 tablet 3   tamsulosin (FLOMAX) 0.4 MG CAPS capsule TAKE 2 CAPSULES BY MOUTH ONCE DAILY AFTER SUPPER 60 capsule 8   vitamin B-12 (CYANOCOBALAMIN) 1000 MCG tablet Take 1,000 mcg by mouth daily.     No current facility-administered medications on file prior to visit.    Allergies:  No Known Allergies  Physical Exam Today's Vitals   08/05/22 1313  BP: (!) 158/93  Pulse: (!) 105  Weight: 187 lb 9.6 oz (85.1 kg)  Height: '5\' 7"'$  (1.702 m)     Body mass index is 29.38 kg/m.   General: Frail very pleasant elderly African-American male, seated, in no evident distress Head: head normocephalic and atraumatic.   Neck: supple with no carotid or supraclavicular bruits Cardiovascular: regular rate and rhythm, no murmurs Musculoskeletal: no deformity Skin:  no rash/petichiae  Vascular:  Normal pulses all extremities  Neurologic Exam Mental Status: Awake and fully alert.  Unable to appreciate dysarthria or aphasia.  Able to follow commands without difficulty.    08/05/2022    1:25 PM 01/20/2022   11:04 AM 02/25/2021    8:43 AM  MMSE - Mini  Mental State Exam  Orientation to time 0 0 1  Orientation to Place '3 3 3  '$ Registration '3 3 3  '$ Attention/ Calculation 0 0 0  Recall '1 1 3  '$ Language- name 2 objects '2 1 1  '$ Language- repeat 1 0 0  Language- follow 3 step command 3 3 0  Language- read & follow direction 0 0 0  Write a sentence 0 0 0  Copy design 0 0 0  Total score '13 11 11   '$ Cranial Nerves: Pupils equal, briskly reactive to light. Extraocular movements full without nystagmus. Visual fields full to confrontation. Hearing intact. Facial sensation intact. Face, tongue, palate moves normally and symmetrically.  Motor: Normal bulk and tone. Normal strength in all tested extremity muscles except mild right grip weakness and diminished fine finger movements on the right and orbits left over right upper extremity and right ankle dorsiflexion and plantarflexion weakness (chronic) Sensory.: intact to touch , pinprick , position and vibratory sensation.  Coordination: Rapid alternating movements normal on left side. Finger-to-nose and heel-to-shin performed accurately on left side with incoordination right upper and lower extremity Gait and Station: Arises from chair with difficulty. Stance is stooped.  Using a walker.  Gait demonstrates slight dragging of the right leg and unsteadiness.  Tandem walk and heel toe not attempted Reflexes: 1+ and symmetric. Toes downgoing.        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  He has post stroke residual mild right hemiparesis as well as dementia and memory loss following his stroke which appears to have worsened after recent admission for seizures in February 2022.      Transient neurological symptoms, Episode of slurred speech and left hand numbness 1 month ago lasting for about 30 seconds then completely resolved. No other associated  symptoms. Neuro exam stable today compared to baseline, no new or worsening chronic deficits noted.  Possible right brain TIA, per request can hold off on MRI brain as symptoms resolved after short duration without any reoccurrence and current treatment plan would not change as he is on max medical therapy. Did discuss importance of calling 911 immediately with any new onset or worsening stroke/TIA symptoms for emergent evaluation Does have hx of carotid stenosis, recommend repeat carotid ultrasound for surveillance monitoring Has follow-up next week with PCP with plans on repeat lab work  Left lacunar infarct:  Residual deficit: Right hemiparesis and cognitive impairment. Stable. Discussed use of RW at all times for fall prevention. Continue aspirin 81 mg daily, Pletal  and atorvastatin for secondary stroke prevention and per cardiology recommendations  Close PCP f/u for aggressive stroke risk factor management including BP goal<130/90, and HLD with LDL goal<70  Vascular dementia: MMSE 13/30 (prior 11/30). Discussed importance of routine memory exercises at home as well as routine physical exercise (as tolerated), healthy diet, adequate sleep and management of risk factors.  Daughter continues to provide 24/7 supervision and care  Seizures:  Stable without new seizure activity.   Continue Keppra 500 mg twice daily - refill provided EEG 04/2022 no evidence of seizure activity (  completed after discontinuing Dilantin)    Follow-up in 7 months or call earlier if needed   CC:  Frann Rider, NP   I spent 34 minutes of face-to-face and non-face-to-face time with patient and daughter.  This included previsit chart review, lab review, study review, order entry, electronic health record documentation, patient and daughter education and discussion regarding above diagnoses and treatment plan and answered all the questions to patient and daughter satisfaction  Frann Rider, [facility]  [facility]  [facility] [address] [place], [place] [address]  Phone [phone] Fax [phone] Note: This document was prepared with digital dictation and possible smart phrase technology. Any transcriptional errors that result from this process are unintentional.

## 2022-08-05 ENCOUNTER — Encounter: Payer: Self-pay | Admitting: Adult Health

## 2022-08-05 ENCOUNTER — Ambulatory Visit: Payer: Medicare Other | Admitting: Adult Health

## 2022-08-05 VITALS — BP 158/93 | HR 105 | Ht 67.0 in | Wt 187.6 lb

## 2022-08-05 DIAGNOSIS — F0150 Vascular dementia without behavioral disturbance: Secondary | ICD-10-CM | POA: Diagnosis not present

## 2022-08-05 DIAGNOSIS — R569 Unspecified convulsions: Secondary | ICD-10-CM | POA: Diagnosis not present

## 2022-08-05 DIAGNOSIS — I6523 Occlusion and stenosis of bilateral carotid arteries: Secondary | ICD-10-CM | POA: Diagnosis not present

## 2022-08-05 DIAGNOSIS — I69398 Other sequelae of cerebral infarction: Secondary | ICD-10-CM

## 2022-08-05 DIAGNOSIS — I639 Cerebral infarction, unspecified: Secondary | ICD-10-CM

## 2022-08-05 MED ORDER — LEVETIRACETAM 500 MG PO TABS: 500.0000 mg | ORAL_TABLET | Freq: Two times a day (BID) | ORAL | 3 refills | Status: DC

## 2022-08-05 NOTE — Patient Instructions (Signed)
Continue aspirin 81 mg daily  and atorvastatin for secondary stroke prevention  You will be called to complete a carotid ultrasound to evaluate for narrowing in the arteries of your neck   Continue Keppra 500 mg twice daily for seizure prevention  Continue to follow up with PCP regarding blood pressure and cholesterol management  Maintain strict control of hypertension with blood pressure goal below 130/90 and cholesterol with LDL cholesterol (bad cholesterol) goal below 70 mg/dL.   Signs of a Stroke? Follow the BEFAST method:  Balance Watch for a sudden loss of balance, trouble with coordination or vertigo Eyes Is there a sudden loss of vision in one or both eyes? Or double vision?  Face: Ask the person to smile. Does one side of the face droop or is it numb?  Arms: Ask the person to raise both arms. Does one arm drift downward? Is there weakness or numbness of a leg? Speech: Ask the person to repeat a simple phrase. Does the speech sound slurred/strange? Is the person confused ? Time: If you observe any of these signs, call 911.    Followup in the future with me in 7 months or call earlier if needed     Thank you for coming to see [place] at [facility]. I hope we have been able to provide you high quality care today.  You may receive a patient satisfaction survey over the next few weeks. We would appreciate your feedback and comments so that we may continue to improve ourselves and the health of our patients. p

## 2022-08-12 ENCOUNTER — Ambulatory Visit (HOSPITAL_COMMUNITY): Admission: RE | Admit: 2022-08-12 | Payer: Medicare Other | Source: Ambulatory Visit

## 2022-08-12 ENCOUNTER — Ambulatory Visit: Payer: Medicare Other | Admitting: Family

## 2022-08-15 ENCOUNTER — Ambulatory Visit: Payer: Medicare Other | Admitting: Family

## 2022-08-19 ENCOUNTER — Encounter: Payer: Medicare Other | Admitting: Physical Medicine and Rehabilitation

## 2022-08-20 ENCOUNTER — Telehealth: Payer: Self-pay | Admitting: Family

## 2022-08-20 ENCOUNTER — Telehealth: Payer: Self-pay | Admitting: *Deleted

## 2022-08-20 NOTE — Telephone Encounter (Signed)
[name] (daughter DPR OK) called stating that she was trying to get in contact with Andres White regarding the pt. She stated she had tried to reach out via the direct line that [place] had given her a while back with no luck. Di Kindle was on the line with another pt at the time and [name] asked if she could call her later. Advised a note would be sent back to reach out to her at a later time. [name] acknowledged understanding.

## 2022-08-20 NOTE — Progress Notes (Signed)
  Care Coordination  Outreach Note  08/20/2022 Name: ALSON MCPHEETERS MRN: [id number] DOB: [birth year]   Care Coordination Outreach Attempts: An unsuccessful telephone outreach was attempted today to offer the patient information about available care coordination services as a benefit of their health plan.   Follow Up Plan:  Additional outreach attempts will be made to offer the patient care coordination information and services.   Encounter Outcome:  Pt. Request to Call [place]  Direct Dial: [phone]

## 2022-08-22 NOTE — Progress Notes (Signed)
  Care Coordination   Note   08/22/2022 Name: Andres White MRN: [id number] DOB: 01-Oct-1945  Andres White is a 77 y.o. year old male who sees Valere Dross, Marvis Repress, [place] for primary care. I reached out to Andres White by phone today to offer care coordination services.  Mr. Darling was given information about Care Coordination services today including:   The Care Coordination services include support from the care team which includes your Nurse Coordinator, Clinical Social Worker, or Pharmacist.  The Care Coordination team is here to help remove barriers to the health concerns and goals most important to you. Care Coordination services are voluntary, and the patient may decline or stop services at any time by request to their care team member.   Care Coordination Consent Status: Patient daughter [name] ( agreed to services and verbal consent obtained.   Follow up plan:  Telephone appointment with care coordination team member scheduled for:  08/26/22  Encounter Outcome:  Pt. Scheduled  [place]  Direct Dial: [phone]

## 2022-08-26 ENCOUNTER — Telehealth: Payer: Self-pay | Admitting: Neurology

## 2022-08-26 ENCOUNTER — Encounter: Payer: Self-pay | Admitting: Neurology

## 2022-08-26 ENCOUNTER — Ambulatory Visit: Payer: Medicare Other | Admitting: Neurology

## 2022-08-26 ENCOUNTER — Ambulatory Visit: Payer: Self-pay | Admitting: Licensed Clinical Social Worker

## 2022-08-26 VITALS — BP 105/55 | HR 51 | Ht 67.0 in | Wt 169.5 lb

## 2022-08-26 DIAGNOSIS — Z8673 Personal history of transient ischemic attack (TIA), and cerebral infarction without residual deficits: Secondary | ICD-10-CM

## 2022-08-26 DIAGNOSIS — G4719 Other hypersomnia: Secondary | ICD-10-CM

## 2022-08-26 DIAGNOSIS — F0150 Vascular dementia without behavioral disturbance: Secondary | ICD-10-CM

## 2022-08-26 DIAGNOSIS — R531 Weakness: Secondary | ICD-10-CM | POA: Diagnosis not present

## 2022-08-26 MED ORDER — GABAPENTIN 300 MG PO CAPS: 300.0000 mg | ORAL_CAPSULE | Freq: Two times a day (BID) | ORAL | 5 refills | Status: DC

## 2022-08-26 NOTE — Telephone Encounter (Signed)
[employer] medicare NPR sent to GI [phone]

## 2022-08-26 NOTE — Progress Notes (Signed)
[facility] [address] [place]. [place] [address] [phone]       OFFICE FOLLOW UP VISIT NOTE  [name] Date of Birth:  1946-05-05 Medical Record Number:  [id number]   Referring MD: Reesa Chew, PA-C Reason for Referral: Stroke, seizures and dementia    Chief Complaint  Patient presents with   Follow-up    Last saw Janett Billow, NP on 01.02.24     HPI:  Update 08/26/2022 : Patient is seen today urgently upon request from his daughter because significant general decline in his condition following a code 3 weeks ago.  Patient's current symptoms seem to have subsided but he is become weak all over with inability to get up and stand or walk.  He is also been mentally quite slow and increased.  He can be aroused but no energy to do anything.  His baseline right-sided weakness also appears more pronounced.  He can hardly stay awake.[name]  He can barely open his eyes and then falls asleep.  He remains on gabapentin 300 mg 3 times daily as well as Eliquis 50 mg at night.  Patient has also not been eating well and may have lost some weight.  Has an upcoming appointment to see his primary care physician coming Friday.  He has not had any recent lab work or brain imaging study done.  Noticed hemiplegia, slurred speech diplopia or other strokelike symptoms. Update 08/05/2022 JM: Patient returns for 19-monthfollow-up.  Daughter reports episode of slurred speech and left hand numbness about 1 month upon awakening that lasted about 30 seconds then completely resolved. Daughter unsure if symptoms from just waking up or sleeping on hand wrong. Patient declined going to ED at that time. Per daughter, he was able to answer questions and follow commands. Did not lose consciousness.  Denies any weakness, visual changes or cognitive changes. No reoccuring or new stroke/TIA symptoms since that time.    Residual right-sided weakness and cognitive impairment.  Use of RW at all times, no  falls. Daughter reports some mild gradual decline in memory since last visit. MMSE today 13/30 (prior 11/30). Daughter continues to provide 24/7 supervision and assistance for ADLs and IADLs.  Remains on aspirin and atorvastatin.  Blood pressure today 158/93.  Routinely follows with PCP. Has f/u next week, plans on repeat lab work.   Denies any seizure activity, remains on Keppra 500 mg BID. Has since stopped Dilantin.  No further concerns at this time    History provided for reference purposes only Update 01/20/2022 JM: Patient returns for stroke, seizure and dementia follow-up after prior visit almost 1 year ago (requested 45-monthollow-up).  He is accompanied by his daughter who provides majority of history.  Stable from stroke standpoint, denies new stroke/TIA symptoms.  Residual right-sided weakness and cognitive impairment stable. Will use RW at all times, no recent falls.  Compliant on aspirin and atorvastatin, denies side effects.  Blood pressure today 150/82.   Cognition has been stable since prior visit.  MMSE today 11/30 (prior 11/30).  Continues to live with his daughter who provides 24/7 supervision and assistance for majority of ADLs.  Recent lab work with PCP showed low B12 at 176 and was started on oral B12 supplement.  Denies any seizure activity.  Discussed weaning off of Dilantin at prior visit but he has since remained on both Keppra and Dilantin due to miscommunication.  Denies side effects on medications.  He has since establish care with new PCP, he  was referred to urologist due to elevated PSA.  Daughter also concerned regarding frequent urination, questions if this is due to furosemide.  This is currently being managed by cardiology which was started back in 10/2020 for edema.  Daughter was encouraged to ensure evaluation with urology scheduled for further discussion as well as with cardiology for furosemide management  No further concerns at this time  Update 02/25/2021  JM: Mr. Viana returns for 35-monthstroke and seizure follow-up accompanied by his daughter who provides history.  He has been stable since prior visit without new stroke/TIA symptoms or seizure activity.  Reports residual right sided weakness and cognitive impairment- [facility] due to [facility].  He does try to stay active at home but not necessarily any type of routine physical or mental exercises.  He ambulates with rolling walker - did have 1 fall when he was not using walker thankfully without injury.  He continues to live with his daughter who provides 24/7 supervision as well as assistance for ADLs.  Cognition has been generally stable but fluctuates per daughter.  On gabapentin 300 mg TID per PMR Dr. RRanell Patrickfor neuropathy pain.  Compliant on aspirin and atorvastatin without associated side effects.  Blood pressure today 151/87.  Compliant on Keppra and Dilantin tolerating without side effects.  No further concerns at this time.  Update 11/08/2020 Dr. SLeonie Man He returns for follow-up after last visit 3 months ago.  Patient is referred back to see me because of new problems of [facility]  Patient will at home when family witnessed that he was not acting his usual self and was gazing upwards.  When EMS arrived they noticed a left gaze deviation and unresponsiveness to voice and following commands and possibly some left-sided weakness.  Patient was initially brought in as a code stroke but while being evaluated was noticed is likely having a seizure because he had tonic gaze deviation with a cry and stiffening of the whole body followed by generalized shaking which lasted about a minute.  He was given IV Ativan which stopped the episode.  He was also loaded with IV Keppra.  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  EEG done on 09/17/2020 and 09/18/2020 showed moderate diffuse encephalopathy there were several episodes of even better being pushed by the staff when they noticed tremulous movements on the right but these did not have any electrographic correlates for seizures.  Patient was intubated and IV phenytoin was added to the Keppra with the got better and self extubated himself.  He finished long-term EEG monitoring which showed no definite seizure activity.  MRI scan of the brain on 09/20/2020 was motion degraded but showed no acute abnormality.  Showed evidence of old infarcts bilaterally.  Patient did have some cognitive impairment following his previous stroke when the daughter feels that this has gotten worse now.  He requires constant supervision.  He cannot be left alone.  He is able to walk with a walker but he is disoriented at baseline and requires help with most activities of daily living.  He has had no recurrent seizures of focal jerking since his being home.  He is still on the current dosages of Keppra finder milligram twice daily and phenytoin 100 mg   3  times a day.  Is had no recurrent stroke or TIA symptoms and remains on aspirin she is tolerating well.  Blood pressure is well controlled today it is slightly elevated 160/82.  Is tolerating Lipitor well without muscle aches and pains.  Initial visit 08/30/2020 Dr. Leonie Man: Mr. [name] is a 77 year old African-American male seen today for initial office consultation visit for stroke.  Is accompanied by his daughter who provides history.  I also reviewed electronic medical records and imaging films in PACS.  He has a past medical history of hypertension, tobacco abuse, arthritis who presented to [facility] on [facility]/25/2020 with right leg weakness for 4 days prior to admission.  He was found to have mild right hand and more leg weakness.  MRI scan of the brain showed a left midbrain  lacunar infarct.  Carotid ultrasound suggested moderate 70 to 90% left ICA stenosis which is asymptomatic.  Echocardiogram showed normal ejection fraction without cardiac source of embolism.  LDL cholesterol was 32 mg percent.  Hemoglobin A1c was 4.8.  Lower extremity venous Dopplers were negative.  Patient subsequently had 2 weeks external cardiac monitor done which was negative for paroxysmal A. fib.  Patient was started on aspirin 81 mg daily which he is tolerating well without bruising or bleeding.  He went to inpatient rehab for 4 weeks and subsequently is at home living with her daughter.  Is able to ambulate independently now though he still has some diminished fine motor skills and mild right leg dragging.  His speech is improved.  He however has significant memory loss and cognitive impairment following his stroke as per the daughter.  He is quite forgetful and cannot remember recent conversations and needs constant reminders.  He was previously living independently and managing his own affairs but daughter feels he is no longer able to do so.  Patient states he is cut back smoking significantly but still smokes a few cigarettes a day.  His blood pressure is well controlled and today it is 142/86.  There is no family history of dementia.  Patient has no prior history of seizures or significant neurological problems.   ROS:   14 system review of systems is positive for those listed in HPI and all other systems negative.     PMH:  Past Medical History:  Diagnosis Date   Arthritis    Chronic back pain    Duodenal adenoma    Gout    Hypertension    Stroke ([place]) 06/2020   Syncope and collapse     Social History:  Social History   Socioeconomic History   Marital status: Single    Spouse name: Not on file   Number of children: 1   Years of education: 12   Highest education level: 12th grade  Occupational History   Not on file  Tobacco Use   Smoking status: Every Day    Packs/day:  1.00    Years: 20.00    Total pack years: 20.00    Types: Cigarettes    Passive exposure: Current   Smokeless tobacco: Never   Tobacco comments:    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, drilling  Vaping Use   Vaping Use: Never used  Substance and Sexual Activity   Alcohol use: Not Currently    Comment: occasional   Drug use: No   Sexual activity: Not Currently  Other Topics Concern   Not on file  Social History Narrative   Lives with daughter, son in law  and 2 grandchildren   Right Handed   Drinks 1 cup caffeine 4 times a week-ish   Social Determinants of Health   Financial Resource Strain: Low Risk  (03/31/2022)   Overall Financial Resource Strain (CARDIA)    Difficulty of Paying Living Expenses: Not hard at all  Food Insecurity: No Food Insecurity (03/31/2022)   Hunger Vital Sign    Worried About Running Out of Food in the Last Year: Never true    Ran Out of Food in the Last Year: Never true  Transportation Needs: No Transportation Needs (12/11/2021)   PRAPARE - Hydrologist (Medical): No    Lack of Transportation (Non-Medical): No  Physical Activity: Inactive (08/26/2022)   Exercise Vital Sign    Days of Exercise per Week: 0 days    Minutes of Exercise per Session: 0 min  Stress: Stress Concern Present (08/26/2022)   [place]    Feeling of Stress : To some extent  Social Connections: Socially Isolated (03/31/2022)   Social Connection and Isolation Panel [NHANES]    Frequency of Communication with Friends and Family: More than three times a week    Frequency of Social Gatherings with Friends and Family: More than three times a week    Attends Religious Services: Never    Marine scientist or Organizations: No    Attends Archivist Meetings: Never    Marital Status: Divorced  Human resources officer Violence: Not At Risk (03/31/2022)   Humiliation, Afraid, Rape, and Kick  questionnaire    Fear of Current or Ex-Partner: No    Emotionally Abused: No    Physically Abused: No    Sexually Abused: No    Medications:   Current Outpatient Medications on File Prior to Visit  Medication Sig Dispense Refill   acetaminophen (TYLENOL) 325 MG tablet Take 1-2 tablets (325-650 mg total) by mouth every 4 (four) hours as needed for mild pain.     allopurinol (ZYLOPRIM) 100 MG tablet TAKE 1 TABLET(100 MG) BY MOUTH DAILY 90 tablet 0   amLODipine (NORVASC) 10 MG tablet Take 1 tablet (10 mg total) by mouth daily. 90 tablet 1   aspirin EC 81 MG tablet Take 1 tablet (81 mg total) by mouth daily. (Patient taking differently: Take 81 mg by mouth in the morning.) 90 tablet 3   atorvastatin (LIPITOR) 40 MG tablet TAKE 1 TABLET BY MOUTH ONCE DAILY 30 tablet 5   cilostazol (PLETAL) 50 MG tablet TAKE 1 TABLET BY MOUTH TWICE DAILY 60 tablet 8   citalopram (CELEXA) 20 MG tablet Take 1 tablet (20 mg total) by mouth daily. 90 tablet 1   colchicine 0.6 MG tablet Take 1 tablet (0.6 mg total) by mouth daily. As needed 30 tablet 5   furosemide (LASIX) 20 MG tablet TAKE 1 TABLET BY MOUTH EVERY MORNING 30 tablet 5   gabapentin (NEURONTIN) 300 MG capsule TAKE 1 CAPSULE BY MOUTH THREE TIMES DAILY 90 capsule 5   hydrocerin (EUCERIN) CREA Apply 1 application. topically 2 (two) times daily. 454 g 1   levETIRAcetam (KEPPRA) 500 MG tablet Take 1 tablet (500 mg total) by mouth 2 (two) times daily. 180 tablet 3   Multiple Vitamin (MULTIVITAMIN WITH MINERALS) TABS tablet Take 1 tablet by mouth daily. (Patient taking differently: Take 1 tablet by mouth daily at 12 noon.)     pantoprazole (PROTONIX) 40 MG tablet TAKE 1 TABLET BY MOUTH TWICE DAILY 60  tablet 5   QUEtiapine (SEROQUEL) 50 MG tablet Take 1 tablet (50 mg total) by mouth at bedtime. 90 tablet 3   tamsulosin (FLOMAX) 0.4 MG CAPS capsule TAKE 2 CAPSULES BY MOUTH ONCE DAILY AFTER SUPPER 60 capsule 8   vitamin B-12 (CYANOCOBALAMIN) 1000 MCG tablet Take  1,000 mcg by mouth daily.     No current facility-administered medications on file prior to visit.    Allergies:  No Known Allergies  Physical Exam Today's Vitals   08/26/22 1305  BP: (!) 105/55  Pulse: (!) 51  Weight: 76.9 kg  Height: '5\' 7"'$  (1.702 m)     Body mass index is 26.55 kg/m.   General: Frail very pleasant elderly African-American male, seated, in no evident distress Head: head normocephalic and atraumatic.   Neck: supple with no carotid or supraclavicular bruits Cardiovascular: regular rate and rhythm, no murmurs Musculoskeletal: no deformity Skin:  no rash/petichiae Vascular:  Normal pulses all extremities  Neurologic Exam Mental Status: Drowsy but can be aroused but cannot hold attention.  Disoriented to time place and person..  Unable to appreciate dysarthria or aphasia.  Able to follow commands without difficulty.  Diminished recall.    08/05/2022    1:25 PM 01/20/2022   11:04 AM 02/25/2021    8:43 AM  MMSE - Mini Mental State Exam  Orientation to time 0 0 1  Orientation to Place '3 3 3  '$ Registration '3 3 3  '$ Attention/ Calculation 0 0 0  Recall '1 1 3  '$ Language- name 2 objects '2 1 1  '$ Language- repeat 1 0 0  Language- follow 3 step command 3 3 0  Language- read & follow direction 0 0 0  Write a sentence 0 0 0  Copy design 0 0 0  Total score '13 11 11   '$ Cranial Nerves: Pupils equal, briskly reactive to light. Extraocular movements full without nystagmus. Visual fields full to confrontation. Hearing intact. Facial sensation intact. Face, tongue, palate moves normally and symmetrically.  Motor: Normal bulk and tone. Normal strength in all tested extremity muscles except mild right grip weakness and diminished fine finger movements on the right and orbits left over right upper extremity and right ankle dorsiflexion and plantarflexion weakness (chronic) Sensory.: intact to touch , pinprick , position and vibratory sensation.  Coordination: Rapid alternating  movements normal on left side. Finger-to-nose and heel-to-shin performed accurately on left side with incoordination right upper and lower extremity Gait and Station: Arises from chair with difficulty. Stance is stooped.  Using a walker.  Gait demonstrates slight dragging of the right leg and unsteadiness.  Tandem walk and heel toe not attempted Reflexes: 1+ and symmetric. Toes downgoing.        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  He has post stroke residual mild right hemiparesis as well as dementia and memory loss following his stroke which appears to have worsened after recent admission for seizures in February 2022.  Patient has not had significant generalized decline in his health and neurological status following recent cold and not had any diagnostic evaluation to look for reversible etiologies.     I had a long discussion with the patient and his daughter regarding his recent generalized weakness and sleepiness following goals and recommend further evaluation with checking CMP, CBC, UA and an overhead.  I encouraged her to keep his upcoming appointment with PCP later this  week and if no reversible causes found may benefit with some physical therapy to improve his physical deconditioning.  Continue Keppra) for seizures Seroquel and taper and reduce the gabapentin as it may be contributing to.  Continue aspirin for stroke prevention and maintain aggressive risk factor modification..  Return for follow-up in the future in 3 months with Janett Billow nurse practitioner or call earlier if necessary       CC:  Marrian Salvage, FNP   I spent 35 minutes of face-to-face and non-face-to-face time with patient and daughter.  This included previsit chart review, lab review, study review, order entry, electronic health record documentation,  patient and daughter education and discussion regarding above diagnoses and treatment plan and answered all the questions to patient and daughter satisfaction  Antony Contras, MD [facility] [address] [place], [place] [address]  Phone [phone] Fax [phone] Note: This document was prepared with digital dictation and possible smart phrase technology. Any transcriptional errors that result from this process are unintentional.

## 2022-08-26 NOTE — Patient Instructions (Signed)
Visit Information  Thank you for taking time to visit with me today. Please don't hesitate to contact me if I can be of assistance to you before our next scheduled telephone appointment.  Following are the goals we discussed today:   Our next appointment is by telephone on 09/15/22 at 10:30 AM   Please call the care guide team at [phone] if you need to cancel or reschedule your appointment.   If you are experiencing a Mental Health or [place] or need someone to talk to, please go to [facility] [phone])   Following is a copy of your full plan of care:   Interventions:  LCSW spoke with [name], daughter of patient, today about patient needs Patient needs daily help with ADLs, ambulation, meals, transportation, medication compliance.  [name] is supportive of client. She also has some help from her spouse in caring for client needs Discussed program support. [name] is interested in Therapist, sports from program calling her to discuss nursing needs of client.  LCSW to request Care Guide scheduler to schedule RN to call [name] to discuss client needs Discussed appetite of client, sleeping issues of client. [name] feels that client may be sleeping too much Discussed mood of client. Client is taking medications as prescribed. [name] feels that client mood is stable at present Discussed with [name] Stroke Support Group and Stroke Warriors program with [facility]. She will inquire about this program support through [facility] Discussed ambulation of client. Client has difficulty standing . Client uses wheelchair to ambulate Discussed medical support of client with Marrian Salvage, FNP Encouraged [name] to contact client insurer, [facility], to discuss insurance benefits of client  [name] is interested in program support for client. She is interested in receiving RN call next week from  program to discuss nursing needs of client .   Mr. Masterson was given information about Care Management services by the embedded care coordination team including:  Care Management services include personalized support from designated clinical staff supervised by his physician, including individualized plan of care and coordination with other care providers 24/7 contact phone numbers for assistance for urgent and routine care needs. The patient may stop CCM services at any time (effective at the end of the month) by phone call to the office staff.  Patient agreed to services and verbal consent obtained.   Norva Riffle.Edrik Rundle MSW, [place] representative [facility] Care Management [phone]

## 2022-08-26 NOTE — Patient Outreach (Signed)
  Care Coordination   Initial Visit Note   08/26/2022 Name: Andres White MRN: [id number] DOB: January 23, 1946  Andres White is a 77 y.o. year old male who sees Andres White, Andres White, Andres White for primary care. I spoke with Andres White, daughter of patient by phone today about client needs.  What matters to the patients health and wellness today?  Patient needs daily help with ADLs, transport, meals, medication compliance. Andres White, daughter of client, is supportive of client.    Goals Addressed             This Visit's Progress    Patient needs daily help with ADLs and ambulation. Daughter, Andres White is supportive       Interventions:  Andres White spoke with Andres White, daughter of patient, today about patient needs Patient needs daily help with ADLs, ambulation, meals, transportation, medication compliance.  Andres White is supportive of client. She also has some help from her spouse in caring for client needs Discussed program support. Andres White is interested in Therapist, sports from program calling her to discuss nursing needs of client.  Andres White to request Care Guide scheduler to schedule RN to call Andres White to discuss client needs Discussed appetite of client, sleeping issues of client. Andres White feels that client may be sleeping too much Discussed mood of client. Client is taking medications as prescribed. Andres White feels that client mood is stable at present Discussed with Andres White Stroke Support Group and Stroke Warriors program with [facility]. She will inquire about this program support through [facility] Discussed ambulation of client. Client has difficulty standing . Client uses wheelchair to ambulate Discussed medical support of client with Andres Salvage, FNP Encouraged Andres White to contact client insurer, [facility], to discuss insurance benefits of client  Andres White is interested in program support for client. She is interested in receiving RN call next week from program to  discuss nursing needs of client .       SDOH assessments and interventions completed:  Yes  SDOH Interventions Today    Flowsheet Row Most Recent Value  SDOH Interventions   Physical Activity Interventions Other (Comments)  [client uses a wheelchair to ambulate. Difficulty standing]  Stress Interventions Other (Comment)  [daughter, Andres White , feels that client may be sleeping too much]        Care Coordination Interventions:  Yes, provided   Follow up plan: Follow up call scheduled for 09/15/22 at 10:30 AM     Encounter Outcome:  Pt. Visit Completed   Andres White MSW, Andres White Andres White [facility] Care White [phone]

## 2022-08-26 NOTE — Patient Instructions (Signed)
I had a long discussion with the patient and his daughter regarding his recent generalized weakness and sleepiness following goals and recommend further evaluation with checking CMP, CBC, UA and an overhead.  I encouraged her to keep his upcoming appointment with PCP later this week and if no reversible causes found may benefit with some physical therapy to improve his physical deconditioning.  Continue Keppra) for seizures Seroquel and taper and reduce the gabapentin as it may be contributing to.  Continue aspirin for stroke prevention and maintain aggressive risk factor modification..  Return for follow-up in the future in 3 months with [place] practitioner or call earlier if necessary

## 2022-08-27 LAB — MICROSCOPIC EXAMINATION
Casts: NONE SEEN /lpf
Epithelial Cells (non renal): NONE SEEN /hpf (ref 0–10)
WBC, UA: >30 /hpf — AB (ref 0–5)

## 2022-08-27 LAB — CBC
Hematocrit: 31.4 % — ABNORMAL LOW (ref 37.5–51.0)
Hemoglobin: 9.7 g/dL — ABNORMAL LOW (ref 13.0–17.7)
MCH: 25.5 pg — ABNORMAL LOW (ref 26.6–33.0)
MCHC: 30.9 g/dL — ABNORMAL LOW (ref 31.5–35.7)
MCV: 82 fL (ref 79–97)
Platelets: 326 10*3/uL (ref 150–450)
RBC: 3.81 x10E6/uL — ABNORMAL LOW (ref 4.14–5.80)
RDW: 16.1 % — ABNORMAL HIGH (ref 11.6–15.4)
WBC: 6.7 10*3/uL (ref 3.4–10.8)

## 2022-08-27 LAB — COMPREHENSIVE METABOLIC PANEL
ALT: 33 IU/L (ref 0–44)
AST: 81 IU/L — ABNORMAL HIGH (ref 0–40)
Albumin/Globulin Ratio: 0.8 — ABNORMAL LOW (ref 1.2–2.2)
Albumin: 3.4 g/dL — ABNORMAL LOW (ref 3.8–4.8)
Alkaline Phosphatase: 105 IU/L (ref 44–121)
BUN/Creatinine Ratio: 21 (ref 10–24)
BUN: 57 mg/dL — ABNORMAL HIGH (ref 8–27)
Bilirubin Total: 0.5 mg/dL (ref 0.0–1.2)
CO2: 23 mmol/L (ref 20–29)
Calcium: 9.3 mg/dL (ref 8.6–10.2)
Chloride: 100 mmol/L (ref 96–106)
Creatinine, Ser: 2.7 mg/dL — ABNORMAL HIGH (ref 0.76–1.27)
Globulin, Total: 4.4 g/dL (ref 1.5–4.5)
Glucose: 95 mg/dL (ref 70–99)
Potassium: 3.2 mmol/L — ABNORMAL LOW (ref 3.5–5.2)
Sodium: 144 mmol/L (ref 134–144)
Total Protein: 7.8 g/dL (ref 6.0–8.5)
eGFR: 23 mL/min/{1.73_m2} — ABNORMAL LOW (ref >59)

## 2022-08-27 LAB — URINALYSIS, ROUTINE W REFLEX MICROSCOPIC
Bilirubin, UA: NEGATIVE
Glucose, UA: NEGATIVE
Ketones, UA: NEGATIVE
Nitrite, UA: NEGATIVE
Protein,UA: NEGATIVE
RBC, UA: NEGATIVE
Specific Gravity, UA: 1.014 (ref 1.005–1.030)
Urobilinogen, Ur: 1 mg/dL (ref 0.2–1.0)
pH, UA: 6 (ref 5.0–7.5)

## 2022-08-28 NOTE — Progress Notes (Signed)
Kindly inform the patient that urinalysis is suggestive of urinary tract infection and blood chemistries suggest worsening kidney function.  Needs to contact his primary care physician for further advice for treatment

## 2022-08-29 ENCOUNTER — Emergency Department (HOSPITAL_BASED_OUTPATIENT_CLINIC_OR_DEPARTMENT_OTHER): Payer: Medicare Other

## 2022-08-29 ENCOUNTER — Inpatient Hospital Stay (HOSPITAL_BASED_OUTPATIENT_CLINIC_OR_DEPARTMENT_OTHER)
Admission: EM | Admit: 2022-08-29 | Discharge: 2022-09-04 | DRG: 871 | Disposition: A | Discharge status: Home Health | Payer: Medicare Other | Source: Ambulatory Visit | Attending: Family Medicine | Admitting: Family Medicine

## 2022-08-29 ENCOUNTER — Other Ambulatory Visit: Payer: Self-pay

## 2022-08-29 ENCOUNTER — Encounter: Payer: Self-pay | Admitting: Family

## 2022-08-29 ENCOUNTER — Ambulatory Visit (INDEPENDENT_AMBULATORY_CARE_PROVIDER_SITE_OTHER): Payer: Medicare Other | Admitting: Family

## 2022-08-29 ENCOUNTER — Encounter (HOSPITAL_COMMUNITY): Payer: Self-pay

## 2022-08-29 VITALS — BP 112/72 | HR 99 | Ht 67.0 in | Wt 169.5 lb

## 2022-08-29 DIAGNOSIS — F17210 Nicotine dependence, cigarettes, uncomplicated: Secondary | ICD-10-CM | POA: Diagnosis present

## 2022-08-29 DIAGNOSIS — E876 Hypokalemia: Secondary | ICD-10-CM | POA: Diagnosis not present

## 2022-08-29 DIAGNOSIS — Q438 Other specified congenital malformations of intestine: Secondary | ICD-10-CM | POA: Diagnosis not present

## 2022-08-29 DIAGNOSIS — N3289 Other specified disorders of bladder: Secondary | ICD-10-CM | POA: Diagnosis not present

## 2022-08-29 DIAGNOSIS — N170 Acute kidney failure with tubular necrosis: Secondary | ICD-10-CM | POA: Diagnosis not present

## 2022-08-29 DIAGNOSIS — N390 Urinary tract infection, site not specified: Secondary | ICD-10-CM | POA: Diagnosis not present

## 2022-08-29 DIAGNOSIS — A419 Sepsis, unspecified organism: Principal | ICD-10-CM | POA: Diagnosis present

## 2022-08-29 DIAGNOSIS — I5032 Chronic diastolic (congestive) heart failure: Secondary | ICD-10-CM | POA: Diagnosis present

## 2022-08-29 DIAGNOSIS — E860 Dehydration: Secondary | ICD-10-CM | POA: Diagnosis present

## 2022-08-29 DIAGNOSIS — N3000 Acute cystitis without hematuria: Secondary | ICD-10-CM

## 2022-08-29 DIAGNOSIS — I10 Essential (primary) hypertension: Secondary | ICD-10-CM | POA: Diagnosis present

## 2022-08-29 DIAGNOSIS — Z1152 Encounter for screening for COVID-19: Secondary | ICD-10-CM | POA: Diagnosis not present

## 2022-08-29 DIAGNOSIS — Z79899 Other long term (current) drug therapy: Secondary | ICD-10-CM

## 2022-08-29 DIAGNOSIS — F0390 Unspecified dementia without behavioral disturbance: Secondary | ICD-10-CM

## 2022-08-29 DIAGNOSIS — N179 Acute kidney failure, unspecified: Secondary | ICD-10-CM | POA: Diagnosis present

## 2022-08-29 DIAGNOSIS — J9811 Atelectasis: Secondary | ICD-10-CM | POA: Diagnosis not present

## 2022-08-29 DIAGNOSIS — Z7982 Long term (current) use of aspirin: Secondary | ICD-10-CM

## 2022-08-29 DIAGNOSIS — E663 Overweight: Secondary | ICD-10-CM | POA: Diagnosis present

## 2022-08-29 DIAGNOSIS — N400 Enlarged prostate without lower urinary tract symptoms: Secondary | ICD-10-CM | POA: Diagnosis present

## 2022-08-29 DIAGNOSIS — N1832 Chronic kidney disease, stage 3b: Secondary | ICD-10-CM | POA: Diagnosis not present

## 2022-08-29 DIAGNOSIS — R3989 Other symptoms and signs involving the genitourinary system: Secondary | ICD-10-CM

## 2022-08-29 DIAGNOSIS — R404 Transient alteration of awareness: Secondary | ICD-10-CM | POA: Diagnosis not present

## 2022-08-29 DIAGNOSIS — D509 Iron deficiency anemia, unspecified: Secondary | ICD-10-CM | POA: Diagnosis present

## 2022-08-29 DIAGNOSIS — Z8669 Personal history of other diseases of the nervous system and sense organs: Secondary | ICD-10-CM | POA: Diagnosis not present

## 2022-08-29 DIAGNOSIS — G9341 Metabolic encephalopathy: Secondary | ICD-10-CM | POA: Diagnosis present

## 2022-08-29 DIAGNOSIS — R2990 Unspecified symptoms and signs involving the nervous system: Secondary | ICD-10-CM | POA: Diagnosis present

## 2022-08-29 DIAGNOSIS — Z8673 Personal history of transient ischemic attack (TIA), and cerebral infarction without residual deficits: Secondary | ICD-10-CM

## 2022-08-29 DIAGNOSIS — F03A0 Unspecified dementia, mild, without behavioral disturbance, psychotic disturbance, mood disturbance, and anxiety: Secondary | ICD-10-CM | POA: Diagnosis not present

## 2022-08-29 DIAGNOSIS — R531 Weakness: Secondary | ICD-10-CM

## 2022-08-29 DIAGNOSIS — R0902 Hypoxemia: Secondary | ICD-10-CM | POA: Diagnosis present

## 2022-08-29 DIAGNOSIS — M109 Gout, unspecified: Secondary | ICD-10-CM | POA: Diagnosis present

## 2022-08-29 DIAGNOSIS — I639 Cerebral infarction, unspecified: Secondary | ICD-10-CM | POA: Diagnosis present

## 2022-08-29 DIAGNOSIS — Z7401 Bed confinement status: Secondary | ICD-10-CM | POA: Diagnosis not present

## 2022-08-29 DIAGNOSIS — Z8249 Family history of ischemic heart disease and other diseases of the circulatory system: Secondary | ICD-10-CM

## 2022-08-29 DIAGNOSIS — Z6828 Body mass index (BMI) 28.0-28.9, adult: Secondary | ICD-10-CM

## 2022-08-29 DIAGNOSIS — R001 Bradycardia, unspecified: Secondary | ICD-10-CM | POA: Diagnosis present

## 2022-08-29 DIAGNOSIS — G40909 Epilepsy, unspecified, not intractable, without status epilepticus: Secondary | ICD-10-CM | POA: Diagnosis present

## 2022-08-29 DIAGNOSIS — I130 Hypertensive heart and chronic kidney disease with heart failure and stage 1 through stage 4 chronic kidney disease, or unspecified chronic kidney disease: Secondary | ICD-10-CM | POA: Diagnosis not present

## 2022-08-29 DIAGNOSIS — G3289 Other specified degenerative disorders of nervous system in diseases classified elsewhere: Secondary | ICD-10-CM | POA: Diagnosis not present

## 2022-08-29 DIAGNOSIS — K828 Other specified diseases of gallbladder: Secondary | ICD-10-CM | POA: Diagnosis not present

## 2022-08-29 DIAGNOSIS — R7989 Other specified abnormal findings of blood chemistry: Secondary | ICD-10-CM

## 2022-08-29 DIAGNOSIS — G8929 Other chronic pain: Secondary | ICD-10-CM | POA: Diagnosis present

## 2022-08-29 DIAGNOSIS — M1990 Unspecified osteoarthritis, unspecified site: Secondary | ICD-10-CM | POA: Diagnosis present

## 2022-08-29 DIAGNOSIS — Z743 Need for continuous supervision: Secondary | ICD-10-CM | POA: Diagnosis not present

## 2022-08-29 DIAGNOSIS — R6521 Severe sepsis with septic shock: Secondary | ICD-10-CM | POA: Diagnosis not present

## 2022-08-29 DIAGNOSIS — I69351 Hemiplegia and hemiparesis following cerebral infarction affecting right dominant side: Secondary | ICD-10-CM

## 2022-08-29 DIAGNOSIS — K760 Fatty (change of) liver, not elsewhere classified: Secondary | ICD-10-CM | POA: Diagnosis not present

## 2022-08-29 DIAGNOSIS — M6281 Muscle weakness (generalized): Secondary | ICD-10-CM | POA: Diagnosis not present

## 2022-08-29 DIAGNOSIS — M549 Dorsalgia, unspecified: Secondary | ICD-10-CM | POA: Diagnosis present

## 2022-08-29 LAB — CBC WITH DIFFERENTIAL/PLATELET
Abs Immature Granulocytes: 0.07 10*3/uL (ref 0.00–0.07)
Basophils Absolute: 0 10*3/uL (ref 0.0–0.1)
Basophils Relative: 0 %
Eosinophils Absolute: 0 10*3/uL (ref 0.0–0.5)
Eosinophils Relative: 0 %
HCT: 32.6 % — ABNORMAL LOW (ref 39.0–52.0)
Hemoglobin: 10.1 g/dL — ABNORMAL LOW (ref 13.0–17.0)
Immature Granulocytes: 1 %
Lymphocytes Relative: 22 %
Lymphs Abs: 1.9 10*3/uL (ref 0.7–4.0)
MCH: 25.4 pg — ABNORMAL LOW (ref 26.0–34.0)
MCHC: 31 g/dL (ref 30.0–36.0)
MCV: 81.9 fL (ref 80.0–100.0)
Monocytes Absolute: 0.4 10*3/uL (ref 0.1–1.0)
Monocytes Relative: 4 %
Neutro Abs: 6.4 10*3/uL (ref 1.7–7.7)
Neutrophils Relative %: 73 %
Platelets: 306 10*3/uL (ref 150–400)
RBC: 3.98 MIL/uL — ABNORMAL LOW (ref 4.22–5.81)
RDW: 18.5 % — ABNORMAL HIGH (ref 11.5–15.5)
WBC: 8.7 10*3/uL (ref 4.0–10.5)
nRBC: 0 % (ref 0.0–0.2)

## 2022-08-29 LAB — URINALYSIS, ROUTINE W REFLEX MICROSCOPIC
Bilirubin Urine: NEGATIVE
Glucose, UA: NEGATIVE mg/dL
Ketones, ur: NEGATIVE mg/dL
Nitrite: NEGATIVE
Protein, ur: NEGATIVE mg/dL
Specific Gravity, Urine: 1.01 (ref 1.005–1.030)
pH: 5.5 (ref 5.0–8.0)

## 2022-08-29 LAB — COMPREHENSIVE METABOLIC PANEL
ALT: 28 U/L (ref 0–44)
AST: 54 U/L — ABNORMAL HIGH (ref 15–41)
Albumin: 3.2 g/dL — ABNORMAL LOW (ref 3.5–5.0)
Alkaline Phosphatase: 94 U/L (ref 38–126)
Anion gap: 14 (ref 5–15)
BUN: 45 mg/dL — ABNORMAL HIGH (ref 8–23)
CO2: 26 mmol/L (ref 22–32)
Calcium: 8.6 mg/dL — ABNORMAL LOW (ref 8.9–10.3)
Chloride: 98 mmol/L (ref 98–111)
Creatinine, Ser: 2.9 mg/dL — ABNORMAL HIGH (ref 0.61–1.24)
GFR, Estimated: 21 mL/min — ABNORMAL LOW (ref >60)
Glucose, Bld: 127 mg/dL — ABNORMAL HIGH (ref 70–99)
Potassium: 2.3 mmol/L — CL (ref 3.5–5.1)
Sodium: 138 mmol/L (ref 135–145)
Total Bilirubin: 0.8 mg/dL (ref 0.3–1.2)
Total Protein: 8.8 g/dL — ABNORMAL HIGH (ref 6.5–8.1)

## 2022-08-29 LAB — I-STAT VENOUS BLOOD GAS, ED
Acid-Base Excess: 3 mmol/L — ABNORMAL HIGH (ref 0.0–2.0)
Bicarbonate: 27.9 mmol/L (ref 20.0–28.0)
Calcium, Ion: 1.11 mmol/L — ABNORMAL LOW (ref 1.15–1.40)
HCT: 34 % — ABNORMAL LOW (ref 39.0–52.0)
Hemoglobin: 11.6 g/dL — ABNORMAL LOW (ref 13.0–17.0)
O2 Saturation: 50 %
Patient temperature: 98.5
Potassium: 2.3 mmol/L — CL (ref 3.5–5.1)
Sodium: 142 mmol/L (ref 135–145)
TCO2: 29 mmol/L (ref 22–32)
pCO2, Ven: 40.7 mmHg — ABNORMAL LOW (ref 44–60)
pH, Ven: 7.443 — ABNORMAL HIGH (ref 7.25–7.43)
pO2, Ven: 26 mmHg — CL (ref 32–45)

## 2022-08-29 LAB — URINALYSIS, MICROSCOPIC (REFLEX)

## 2022-08-29 LAB — LACTIC ACID, PLASMA
Lactic Acid, Venous: 2.6 mmol/L (ref 0.5–1.9)
Lactic Acid, Venous: 1.7 mmol/L (ref 0.5–1.9)

## 2022-08-29 LAB — RESP PANEL BY RT-PCR (RSV, FLU A&B, COVID)  RVPGX2
Influenza A by PCR: NEGATIVE
Influenza B by PCR: NEGATIVE
Resp Syncytial Virus by PCR: NEGATIVE
SARS Coronavirus 2 by RT PCR: NEGATIVE

## 2022-08-29 LAB — MAGNESIUM: Magnesium: 1.6 mg/dL — ABNORMAL LOW (ref 1.7–2.4)

## 2022-08-29 MED ORDER — POTASSIUM CHLORIDE 10 MEQ/100ML IV SOLN
10.0000 meq | INTRAVENOUS | Status: AC
  Administered 2022-08-29 (×6): 10 meq via INTRAVENOUS
  Filled 2022-08-29 (×6): qty 100

## 2022-08-29 MED ORDER — SODIUM CHLORIDE 0.9 % IV SOLN
1.0000 g | Freq: Once | INTRAVENOUS | Status: AC
  Administered 2022-08-29: 1 g via INTRAVENOUS
  Filled 2022-08-29: qty 10

## 2022-08-29 MED ORDER — SODIUM CHLORIDE 0.9 % IV SOLN: INTRAVENOUS | Status: DC | PRN

## 2022-08-29 MED ORDER — MAGNESIUM SULFATE 2 GM/50ML IV SOLN
2.0000 g | Freq: Once | INTRAVENOUS | Status: AC
  Administered 2022-08-29: 2 g via INTRAVENOUS
  Filled 2022-08-29: qty 50

## 2022-08-29 MED ORDER — SODIUM CHLORIDE 0.9 % IV BOLUS
1000.0000 mL | Freq: Once | INTRAVENOUS | Status: AC
  Administered 2022-08-29: 1000 mL via INTRAVENOUS

## 2022-08-29 NOTE — ED Notes (Signed)
[facility] called for report, ETA 15 min.  Pt stable for transfer

## 2022-08-29 NOTE — ED Triage Notes (Addendum)
Pt arrived in w/c . Accompanied by daughter . Sent from Dr office upstairs  due to weakness and possible UTI. Pt leaning forward in wheelchair lethargic   skin cool to touch difficulty getting o2 and oral temp  Daughter reports cough/cold symptoms over week ago. Weight loss , still drinking good

## 2022-08-29 NOTE — Progress Notes (Signed)
Andres White is a 77 y.o. 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 10/02/2020   Endotracheally intubated 09/16/2020   Community acquired pneumonia 09/16/2020   Cervical spondylosis 09/16/2020   Vertebral artery stenosis 09/16/2020   Status epilepticus ([place]) 09/16/2020   CKD (chronic kidney disease) stage 3, GFR 30-59 ml/min (HCC) 09/16/2020   Occult blood in stools    Leukocytosis    Hyponatremia    History of hypertension    Dysphagia, post-stroke    Dysesthesia    Neuropathic pain    Brainstem infarct, acute (HCC)    Acute blood loss anemia    AKI (acute kidney injury) ([place])    Stroke (cerebrum) ([place]) 06/27/2020   Right leg weakness    Left carotid stenosis 04/13/2014    Current Outpatient Medications  Medication Sig Dispense Refill   acetaminophen (TYLENOL) 325 MG tablet Take 1-2 tablets (325-650 mg total) by mouth every 4 (four) hours as needed for mild pain.     allopurinol (ZYLOPRIM) 100 MG tablet TAKE 1 TABLET(100 MG) BY MOUTH DAILY 90 tablet 0   amLODipine (NORVASC) 10 MG tablet Take 1 tablet (10 mg total) by mouth daily. 90 tablet 1   aspirin EC 81 MG tablet Take 1 tablet (81 mg total) by mouth daily. (Patient taking differently: Take 81 mg by mouth in the morning.) 90 tablet 3   atorvastatin (LIPITOR) 40 MG tablet TAKE 1 TABLET BY MOUTH ONCE DAILY 30 tablet 5   cilostazol (PLETAL) 50 MG tablet TAKE 1 TABLET BY MOUTH TWICE DAILY 60 tablet 8   citalopram (CELEXA) 20 MG tablet Take 1 tablet (20 mg total) by mouth daily. 90 tablet 1   colchicine 0.6 MG tablet Take 1 tablet (0.6 mg total) by mouth daily. As  needed 30 tablet 5   furosemide (LASIX) 20 MG tablet TAKE 1 TABLET BY MOUTH EVERY MORNING 30 tablet 5   gabapentin (NEURONTIN) 300 MG capsule Take 1 capsule (300 mg total) by mouth 2 (two) times daily. Reduce gabapentin to 300 mg twice daily for 1 week followed by once daily for a week and stop 90 capsule 5   hydrocerin (EUCERIN) CREA Apply 1 application. topically 2 (two) times daily. 454 g 1   levETIRAcetam (KEPPRA) 500 MG tablet Take 1 tablet (500 mg total) by mouth 2 (two) times daily. 180 tablet 3   Multiple Vitamin (MULTIVITAMIN WITH MINERALS) TABS tablet Take 1 tablet by mouth daily. (Patient taking differently: Take 1 tablet by mouth daily at 12 noon.)     pantoprazole (PROTONIX) 40 MG tablet TAKE 1 TABLET BY MOUTH TWICE DAILY 60 tablet 5   tamsulosin (FLOMAX) 0.4 MG CAPS capsule TAKE 2 CAPSULES BY MOUTH ONCE DAILY AFTER SUPPER 60 capsule 8   vitamin B-12 (CYANOCOBALAMIN) 1000 MCG tablet Take 1,000 mcg by mouth daily.     No current facility-administered medications for this visit.    Allergies: Patient has no known allergies.  Past Medical History:  Diagnosis Date   Arthritis    Chronic back pain    Duodenal adenoma    Gout    Hypertension    Stroke [facility]) 06/2020  Syncope and collapse     Past Surgical History:  Procedure Laterality Date   BIOPSY  07/13/2020   Procedure: BIOPSY;  Surgeon: Ladene Artist, MD;  Location: [place];  Service: Endoscopy;;   BIOPSY  11/28/2020   Procedure: BIOPSY;  Surgeon: Irving Copas., MD;  Location: [facility];  Service: Gastroenterology;;   CARDIAC CATHETERIZATION  2015   [facility]    COLONOSCOPY WITH PROPOFOL N/A 07/13/2020   Procedure: COLONOSCOPY WITH PROPOFOL;  Surgeon: Ladene Artist, MD;  Location: [facility] ENDOSCOPY;  Service: Endoscopy;  Laterality: N/A;   COLONOSCOPY WITH PROPOFOL N/A 11/28/2020   Procedure: COLONOSCOPY WITH PROPOFOL;  Surgeon: Rush Landmark Telford Nab., MD;  Location: [facility] ENDOSCOPY;  Service: Gastroenterology;   Laterality: N/A;   ENDOSCOPIC MUCOSAL RESECTION N/A 11/28/2020   Procedure: ENDOSCOPIC MUCOSAL RESECTION;  Surgeon: Rush Landmark Telford Nab., MD;  Location: [facility] ENDOSCOPY;  Service: Gastroenterology;  Laterality: N/A;   ESOPHAGOGASTRODUODENOSCOPY N/A 07/13/2020   Procedure: ESOPHAGOGASTRODUODENOSCOPY (EGD);  Surgeon: Ladene Artist, MD;  Location: [facility] END[facility]COPY;  Service: Endoscopy;  Laterality: N/A;   ESOPHAGOGASTRODUODENOSCOPY (EGD) WITH PROPOFOL N/A 11/28/2020   Procedure: ESOPHAGOGASTRODUODENOSCOPY (EGD) WITH PROPOFOL;  Surgeon: Rush Landmark Telford Nab., MD;  Location: [facility] ENDOSCOPY;  Service: Gastroenterology;  Laterality: N/A;   HEMOSTASIS CLIP PLACEMENT  11/28/2020   Procedure: HEMOSTASIS CLIP PLACEMENT;  Surgeon: Irving Copas., MD;  Location: [facility];  Service: Gastroenterology;;   KNEE SURGERY Right    POLYPECTOMY  07/13/2020   Procedure: POLYPECTOMY;  Surgeon: Ladene Artist, MD;  Location: [facility] ENDOSCOPY;  Service: Endoscopy;;   POLYPECTOMY  11/28/2020   Procedure: POLYPECTOMY;  Surgeon: Irving Copas., MD;  Location: [facility];  Service: Gastroenterology;;   SUBMUCOSAL LIFTING INJECTION  11/28/2020   Procedure: SUBMUCOSAL LIFTING INJECTION;  Surgeon: Irving Copas., MD;  Location: [facility];  Service: Gastroenterology;;   SUBMUCOSAL TATTOO INJECTION  11/28/2020   Procedure: SUBMUCOSAL TATTOO INJECTION;  Surgeon: Irving Copas., MD;  Location: [facility] ENDOSCOPY;  Service: Gastroenterology;;    Family History  Problem Relation Age of Onset   Hypertension Mother    Colon cancer Neg Hx    Pancreatic cancer Neg Hx    Stomach cancer Neg Hx    Esophageal cancer Neg Hx    Inflammatory bowel disease Neg Hx    Liver disease Neg Hx    Rectal cancer Neg Hx     Social History   Tobacco Use   Smoking status: Every Day    Packs/day: 1.00    Years: 20.00    Total pack years: 20.00    Types: Cigarettes    Passive exposure: Current   Smokeless  tobacco: Never   Tobacco comments:    Verified by Daughter - [name]  Substance Use Topics   Alcohol use: Not Currently    Comment: occasional    Subjective:  Brought to the office by daughter/ son-in-law; per daughter, he has been very weak and not himself since he was sick with "bad cold" earlier this month;  His neurologist had messaged earlier today that they were concerned about appearance of urine and worsening kidney functions from labs that were done earlier this week;   Objective:  Vitals:   08/29/22 1539  BP: 112/72  Pulse: 99  SpO2: 96%  Weight: 169 lb 8 oz (76.9 kg)  Height: '5\' 7"'$  (1.702 m)    General: Well developed, well nourished, weak/ frail appearing; leaning toward the left in his seated wheelchair; holding left side of head in  his hand; does not look up to participate in office visit Skin : Warm and dry.  Head: Normocephalic and atraumatic  Lungs: Respirations unlabored;  CVS exam: normal rate and regular rhythm.  Neurologic: Alert and oriented; speech intact; face symmetrical; moves all extremities well; CNII-XII intact without focal deficit   Assessment:  1. Weakness   2. Dementia without behavioral disturbance, psychotic disturbance, mood disturbance, or anxiety, unspecified dementia severity, unspecified dementia type ([place])   3. Suspected UTI   4. Elevated serum creatinine     Plan:  Patient's appearance in office is concerning; ? Is underlying UTI causing majority of symptoms vs possible pneumonia secondary to recent illness from early January 2024; family members in agreement that needs to be seen at ER and will most likely need to be admitted; they take patient directly to ER from our office; I did speak to ER provider with concerns; follow up to be determined;   No follow-ups on file.  No orders of the defined types were placed in this encounter.   Requested Prescriptions    No prescriptions requested or ordered in this encounter

## 2022-08-29 NOTE — ED Notes (Signed)
Report given to Jarrett Soho, RN at [facility].

## 2022-08-29 NOTE — ED Provider Notes (Signed)
[place] EMERGENCY DEPARTMENT AT [place] [facility] Provider Note   CSN: [id number] Arrival date & time: 08/29/22  1615     History  Chief Complaint  Patient presents with   Weakness    Andres White is a 77 y.o. male here presenting with weakness and possible UTI.  Patient was seen and neurology office earlier this week and was found to have AKI.  His urinalysis came back positive for UTI.  Patient was seen by primary care today and was sent in because of altered mental status.  Patient has some cold and cough about a week ago.  Patient was noted to be very altered in triage was called in the room for evaluation.  Daughter is answering most questions as patient is very confused.  Unable to get a good pulse ox in the room.  The history is provided by the patient.       Home Medications Prior to Admission medications   Medication Sig Start Date End Date Taking? Authorizing Provider  acetaminophen (TYLENOL) 325 MG tablet Take 1-2 tablets (325-650 mg total) by mouth every 4 (four) hours as needed for mild pain. 07/05/20   Love, [name], PA-C  allopurinol (ZYLOPRIM) 100 MG tablet TAKE 1 TABLET(100 MG) BY MOUTH DAILY 07/14/22   Raulkar, Clide Deutscher, MD  amLODipine (NORVASC) 10 MG tablet Take 1 tablet (10 mg total) by mouth daily. 04/15/22   Marrian Salvage, FNP  aspirin EC 81 MG tablet Take 1 tablet (81 mg total) by mouth daily. Patient taking differently: Take 81 mg by mouth in the morning. 07/18/20   Love, [name], PA-C  atorvastatin (LIPITOR) 40 MG tablet TAKE 1 TABLET BY MOUTH ONCE DAILY 04/10/22   Raulkar, Clide Deutscher, MD  cilostazol (PLETAL) 50 MG tablet TAKE 1 TABLET BY MOUTH TWICE DAILY 06/18/22   Raulkar, Clide Deutscher, MD  citalopram (CELEXA) 20 MG tablet Take 1 tablet (20 mg total) by mouth daily. 04/15/22   Marrian Salvage, FNP  colchicine 0.6 MG tablet Take 1 tablet (0.6 mg total) by mouth daily. As needed 04/15/22   Marrian Salvage, FNP  furosemide (LASIX)  20 MG tablet TAKE 1 TABLET BY MOUTH EVERY MORNING 02/26/22   O'Neal, Cassie Freer, MD  gabapentin (NEURONTIN) 300 MG capsule Take 1 capsule (300 mg total) by mouth 2 (two) times daily. Reduce gabapentin to 300 mg twice daily for 1 week followed by once daily for a week and stop 08/26/22   Garvin Fila, MD  hydrocerin (EUCERIN) CREA Apply 1 application. topically 2 (two) times daily. 12/10/21   Marrian Salvage, FNP  levETIRAcetam (KEPPRA) 500 MG tablet Take 1 tablet (500 mg total) by mouth 2 (two) times daily. 08/05/22   Frann Rider, NP  Multiple Vitamin (MULTIVITAMIN WITH MINERALS) TABS tablet Take 1 tablet by mouth daily. Patient taking differently: Take 1 tablet by mouth daily at 12 noon. 07/19/20   Love, [name], PA-C  pantoprazole (PROTONIX) 40 MG tablet TAKE 1 TABLET BY MOUTH TWICE DAILY 04/10/22   Marrian Salvage, FNP  tamsulosin (FLOMAX) 0.4 MG CAPS capsule TAKE 2 CAPSULES BY MOUTH ONCE DAILY AFTER SUPPER 05/09/22   Raulkar, Clide Deutscher, MD  vitamin B-12 (CYANOCOBALAMIN) 1000 MCG tablet Take 1,000 mcg by mouth daily.    [provider]      Allergies    Patient has no known allergies.    Review of Systems   Review of Systems  Genitourinary:  Positive for decreased  urine volume.  Neurological:  Positive for weakness.  All other systems reviewed and are negative.   Physical Exam Updated Vital Signs BP (!) 136/93   Pulse 82   Temp 98.5 F (36.9 C) (Rectal)   Resp 15   Wt 76.9 kg   SpO2 97%   BMI 26.55 kg/m  Physical Exam Vitals and nursing note reviewed.  Constitutional:      Comments: Confused and altered  HENT:     Head: Normocephalic.     Mouth/Throat:     Mouth: Mucous membranes are dry.  Eyes:     Extraocular Movements: Extraocular movements intact.     Pupils: Pupils are equal, round, and reactive to light.  Cardiovascular:     Rate and Rhythm: Normal rate and regular rhythm.     Pulses: Normal pulses.     Heart sounds: Normal heart sounds.   Pulmonary:     Comments: Slightly tachypneic and diminished bilateral bases Abdominal:     General: Abdomen is flat.     Palpations: Abdomen is soft.  Musculoskeletal:        General: Normal range of motion.     Cervical back: Normal range of motion and neck supple.  Skin:    General: Skin is warm.     Capillary Refill: Capillary refill takes less than 2 seconds.  Neurological:     Comments: Confused and right-sided weakness which is chronic  Psychiatric:     Comments: Unable     ED Results / Procedures / Treatments   Labs (all labs ordered are listed, but only abnormal results are displayed) Labs Reviewed  CBC WITH DIFFERENTIAL/PLATELET - Abnormal; Notable for the following components:      Result Value   RBC 3.98 (*)    Hemoglobin 10.1 (*)    HCT 32.6 (*)    MCH 25.4 (*)    RDW 18.5 (*)    All other components within normal limits  I-STAT VENOUS BLOOD GAS, ED - Abnormal; Notable for the following components:   pH, Ven 7.443 (*)    pCO2, Ven 40.7 (*)    pO2, Ven 26 (*)    Acid-Base Excess 3.0 (*)    Potassium 2.3 (*)    Calcium, Ion 1.11 (*)    HCT 34.0 (*)    Hemoglobin 11.6 (*)    All other components within normal limits  CULTURE, BLOOD (ROUTINE X 2)  CULTURE, BLOOD (ROUTINE X 2)  RESP PANEL BY RT-PCR (RSV, FLU A&B, COVID)  RVPGX2  COMPREHENSIVE METABOLIC PANEL  LACTIC ACID, PLASMA  LACTIC ACID, PLASMA  URINALYSIS, ROUTINE W REFLEX MICROSCOPIC  MAGNESIUM    EKG None  Radiology CT Head Wo Contrast  Result Date: 08/29/2022 CLINICAL DATA:  Altered mental status EXAM: CT HEAD WITHOUT CONTRAST TECHNIQUE: Contiguous axial images were obtained from the base of the skull through the vertex without intravenous contrast. RADIATION DOSE REDUCTION: This exam was performed according to the departmental dose-optimization program which includes automated exposure control, adjustment of the mA and/or kV according to patient size and/or use of iterative reconstruction  technique. COMPARISON:  09/16/2020 FINDINGS: Brain: No evidence of acute infarction, hemorrhage, hydrocephalus, extra-axial collection or mass lesion/mass effect. Chronic atrophic and ischemic changes are noted similar to that seen on prior exam. Prior infarcts are noted of the thalami bilaterally as well as in the right posterior parietooccipital lobe and cerebellar hemispheres. Vascular: No hyperdense vessel or unexpected calcification. Skull: Normal. Negative for fracture or focal lesion. Sinuses/Orbits: No  acute finding. Other: None. IMPRESSION: Chronic atrophic and ischemic changes.  No acute abnormality noted. Electronically Signed   By: Inez Catalina M.D.   On: 08/29/2022 17:43   DG Chest Port 1 View  Result Date: 08/29/2022 CLINICAL DATA:  Altered mental status EXAM: PORTABLE CHEST 1 VIEW COMPARISON:  09/19/2020 FINDINGS: Cardiac shadow is within normal limits. Aortic calcifications are noted. Lungs are well aerated bilaterally. Mild left basilar atelectasis is seen. No bony abnormality is noted. IMPRESSION: Mild left basilar atelectasis. Electronically Signed   By: Inez Catalina M.D.   On: 08/29/2022 17:41    Procedures Procedures    CRITICAL CARE Performed by: Wandra Arthurs   Total critical care time: 30 minutes  Critical care time was exclusive of separately billable procedures and treating other patients.  Critical care was necessary to treat or prevent imminent or life-threatening deterioration.  Critical care was time spent personally by me on the following activities: development of treatment plan with patient and/or surrogate as well as nursing, discussions with consultants, evaluation of patient's response to treatment, examination of patient, obtaining history from patient or surrogate, ordering and performing treatments and interventions, ordering and review of laboratory studies, ordering and review of radiographic studies, pulse oximetry and re-evaluation of patient's  condition.  Angiocath insertion Performed by: Wandra Arthurs  Consent: Verbal consent obtained. Risks and benefits: risks, benefits and alternatives were discussed Time out: Immediately prior to procedure a "time out" was called to verify the correct patient, procedure, equipment, support staff and site/side marked as required.  Preparation: Patient was prepped and draped in the usual sterile fashion.  Vein Location: R antecube  Ultrasound Guided  Gauge: 20 long   Normal blood return and flush without difficulty Patient tolerance: Patient tolerated the procedure well with no immediate complications.    Medications Ordered in ED Medications  sodium chloride 0.9 % bolus 1,000 mL (1,000 mLs Intravenous New Bag/Given 08/29/22 1736)    ED Course/ Medical Decision Making/ A&P                             Medical Decision Making Andres White is a 77 y.o. male here presenting with confusion and possible UTI and acute renal failure.  Patient has history of stroke and weak on the right side at baseline.  Patient had URI symptoms and had UA done in office that showed possible UTI.  Also labs showed creatinine of 2.7 and baseline is less than 2.  Concerned that he may be septic from UTI.  Will get CBC and CMP and lactate and cultures and chest x-ray and CT head.  Will also get CT renal stone as well.  7:18 PM Reviewed patient's labs independently interpreted imaging studies.  Potassium is 2.3 and patient has acute renal failure with creatinine 2.9.  Lactate is elevated at 2.6.  UA showed UTI.  Patient also has low magnesium as well.  CT renal stone did not show any hydronephrosis and CT head did not show any bleeding.  Patient was given multiple rounds of potassium and magnesium.  Patient also given antibiotics for UTI.  At this point hospitalist to admit patient.   Problems Addressed: AKI (acute kidney injury) ([place]): acute illness or injury Hypokalemia: acute illness or  injury Hypomagnesemia: acute illness or injury Urinary tract infection without hematuria, site unspecified: acute illness or injury  Amount and/or Complexity of Data Reviewed Labs: ordered. Decision-making details documented in ED  Course. Radiology: ordered and independent interpretation performed. Decision-making details documented in ED Course. ECG/medicine tests: ordered and independent interpretation performed. Decision-making details documented in ED Course.  Risk Prescription drug management. Decision regarding hospitalization.    Final Clinical Impression(s) / ED Diagnoses Final diagnoses:  None    Rx / DC Orders ED Discharge Orders     None         Drenda Freeze, MD 08/29/22 1919

## 2022-08-29 NOTE — ED Notes (Signed)
Called [facility] for transport to [address] for admission.

## 2022-08-29 NOTE — ED Notes (Signed)
IN/Out Cath performed with me RN and secondary Medic (Wes), slight resistance met but able to pass catheter, cloudy pale yellow urine obtained at taken to lab.  Pt tolerated procedure well.

## 2022-08-29 NOTE — Progress Notes (Signed)
Hospitalist Transfer Note:  Transferring facility: [facility] Requesting provider: Dr. Darl Householder (EDP at [facility]) Reason for transfer: admission for further evaluation and management of acute renal failure, with presentation also notable for acute encephalopathy suspected to be metabolic in nature, along with urinary tract infection and multiple additional electrolyte abnormalities including hypokalemia and hypomagnesemia.    77 y.o.  male  with medical history notable for stroke, who presented to [facility] ED at the instruction of his PCP for evaluation of multiple laboratory abnormalities including suggestion of acute renal failure.   The patient had followed up with outpatient neurology earlier this week in the setting of a history of stroke.  At that time, neurology reportedly noted the patient to be slightly confused relative to his baseline mental status, prompting a variety of labs to be ordered.  The patient subsequently followed up with his PCP today (1/26), at which time PCP who reviewed with him the results of aforementioned labs, which were notable for elevated creatinine at 2.9 compared to 1.63 on 04/22/2022 and relative to 1.46 on 12/10/2021.  These labs were also suggestive of a urinary tract infection, for which the patient has reportedly not been on any recent antibiotics.    PCP subsequently recommended to the patient that he present to the emergency department for further evaluation management of the above.  Patient reportedly has also been exhibiting recent decline in oral intake.   EDP at [facility] also noted the patient to be confused.  Labs checked [facility] were also notable for potassium of 2.3 as well as a low magnesium level.  Repeat urinalysis reportedly consistent with urinary tract infection, prompting him to receive a dose of Rocephin at [facility].  CBC notable for a nonelevated white blood cell count.  Imaging notable for CT renal stone, which showed no acute abnormality,  including no evidence of ureteral stone or any evidence of obstruction.   Subsequently, I accepted this patient for transfer for inpatient admission to a med-tele bed at [facility] for further work-up and management of the above, with [facility] Pontes selected relative to [facility] in case the patient should subsequently warrant consultation of nephrology.       Check [email or web address] for on-call coverage.   Nursing staff, Please call [place] number on Amion as soon as patient's arrival, so appropriate admitting provider can evaluate the pt.     Babs Bertin, DO Hospitalist

## 2022-08-29 NOTE — ED Notes (Signed)
Patient transported to CT 

## 2022-08-30 ENCOUNTER — Encounter (HOSPITAL_COMMUNITY): Payer: Self-pay | Admitting: Family Medicine

## 2022-08-30 DIAGNOSIS — N1832 Chronic kidney disease, stage 3b: Secondary | ICD-10-CM | POA: Diagnosis present

## 2022-08-30 DIAGNOSIS — N400 Enlarged prostate without lower urinary tract symptoms: Secondary | ICD-10-CM | POA: Diagnosis present

## 2022-08-30 DIAGNOSIS — I10 Essential (primary) hypertension: Secondary | ICD-10-CM | POA: Diagnosis not present

## 2022-08-30 DIAGNOSIS — F03A0 Unspecified dementia, mild, without behavioral disturbance, psychotic disturbance, mood disturbance, and anxiety: Secondary | ICD-10-CM | POA: Diagnosis present

## 2022-08-30 DIAGNOSIS — R6521 Severe sepsis with septic shock: Secondary | ICD-10-CM

## 2022-08-30 DIAGNOSIS — G3289 Other specified degenerative disorders of nervous system in diseases classified elsewhere: Secondary | ICD-10-CM | POA: Diagnosis present

## 2022-08-30 DIAGNOSIS — Z8669 Personal history of other diseases of the nervous system and sense organs: Secondary | ICD-10-CM | POA: Diagnosis not present

## 2022-08-30 DIAGNOSIS — R531 Weakness: Secondary | ICD-10-CM | POA: Diagnosis present

## 2022-08-30 DIAGNOSIS — R0902 Hypoxemia: Secondary | ICD-10-CM | POA: Diagnosis present

## 2022-08-30 DIAGNOSIS — J9811 Atelectasis: Secondary | ICD-10-CM | POA: Diagnosis present

## 2022-08-30 DIAGNOSIS — I69351 Hemiplegia and hemiparesis following cerebral infarction affecting right dominant side: Secondary | ICD-10-CM | POA: Diagnosis not present

## 2022-08-30 DIAGNOSIS — A419 Sepsis, unspecified organism: Secondary | ICD-10-CM | POA: Diagnosis present

## 2022-08-30 DIAGNOSIS — E876 Hypokalemia: Secondary | ICD-10-CM | POA: Diagnosis present

## 2022-08-30 DIAGNOSIS — E860 Dehydration: Secondary | ICD-10-CM | POA: Diagnosis present

## 2022-08-30 DIAGNOSIS — Z6828 Body mass index (BMI) 28.0-28.9, adult: Secondary | ICD-10-CM | POA: Diagnosis not present

## 2022-08-30 DIAGNOSIS — E663 Overweight: Secondary | ICD-10-CM | POA: Diagnosis present

## 2022-08-30 DIAGNOSIS — I130 Hypertensive heart and chronic kidney disease with heart failure and stage 1 through stage 4 chronic kidney disease, or unspecified chronic kidney disease: Secondary | ICD-10-CM | POA: Diagnosis present

## 2022-08-30 DIAGNOSIS — D509 Iron deficiency anemia, unspecified: Secondary | ICD-10-CM | POA: Diagnosis present

## 2022-08-30 DIAGNOSIS — F17210 Nicotine dependence, cigarettes, uncomplicated: Secondary | ICD-10-CM | POA: Diagnosis present

## 2022-08-30 DIAGNOSIS — Z8249 Family history of ischemic heart disease and other diseases of the circulatory system: Secondary | ICD-10-CM | POA: Diagnosis not present

## 2022-08-30 DIAGNOSIS — N390 Urinary tract infection, site not specified: Secondary | ICD-10-CM | POA: Diagnosis present

## 2022-08-30 DIAGNOSIS — N179 Acute kidney failure, unspecified: Secondary | ICD-10-CM | POA: Diagnosis present

## 2022-08-30 DIAGNOSIS — N170 Acute kidney failure with tubular necrosis: Secondary | ICD-10-CM | POA: Diagnosis not present

## 2022-08-30 DIAGNOSIS — G40909 Epilepsy, unspecified, not intractable, without status epilepticus: Secondary | ICD-10-CM | POA: Diagnosis present

## 2022-08-30 DIAGNOSIS — Z1152 Encounter for screening for COVID-19: Secondary | ICD-10-CM | POA: Diagnosis not present

## 2022-08-30 DIAGNOSIS — I5032 Chronic diastolic (congestive) heart failure: Secondary | ICD-10-CM | POA: Diagnosis present

## 2022-08-30 DIAGNOSIS — N3000 Acute cystitis without hematuria: Secondary | ICD-10-CM | POA: Diagnosis not present

## 2022-08-30 DIAGNOSIS — Z8673 Personal history of transient ischemic attack (TIA), and cerebral infarction without residual deficits: Secondary | ICD-10-CM | POA: Diagnosis not present

## 2022-08-30 DIAGNOSIS — G9341 Metabolic encephalopathy: Secondary | ICD-10-CM | POA: Diagnosis not present

## 2022-08-30 DIAGNOSIS — M109 Gout, unspecified: Secondary | ICD-10-CM | POA: Diagnosis present

## 2022-08-30 DIAGNOSIS — M6281 Muscle weakness (generalized): Secondary | ICD-10-CM | POA: Diagnosis not present

## 2022-08-30 LAB — BLOOD GAS, ARTERIAL
Acid-base deficit: 2.3 mmol/L — ABNORMAL HIGH (ref 0.0–2.0)
Bicarbonate: 21.4 mmol/L (ref 20.0–28.0)
O2 Saturation: 100 %
Patient temperature: 36.5
pCO2 arterial: 32 mmHg (ref 32–48)
pH, Arterial: 7.43 (ref 7.35–7.45)
pO2, Arterial: 126 mmHg — ABNORMAL HIGH (ref 83–108)

## 2022-08-30 LAB — COMPREHENSIVE METABOLIC PANEL
ALT: 20 U/L (ref 0–44)
AST: 38 U/L (ref 15–41)
Albumin: 2.4 g/dL — ABNORMAL LOW (ref 3.5–5.0)
Alkaline Phosphatase: 76 U/L (ref 38–126)
Anion gap: 11 (ref 5–15)
BUN: 41 mg/dL — ABNORMAL HIGH (ref 8–23)
CO2: 24 mmol/L (ref 22–32)
Calcium: 8.6 mg/dL — ABNORMAL LOW (ref 8.9–10.3)
Chloride: 106 mmol/L (ref 98–111)
Creatinine, Ser: 2.84 mg/dL — ABNORMAL HIGH (ref 0.61–1.24)
GFR, Estimated: 22 mL/min — ABNORMAL LOW (ref >60)
Glucose, Bld: 104 mg/dL — ABNORMAL HIGH (ref 70–99)
Potassium: 2.4 mmol/L — CL (ref 3.5–5.1)
Sodium: 141 mmol/L (ref 135–145)
Total Bilirubin: 0.6 mg/dL (ref 0.3–1.2)
Total Protein: 6.6 g/dL (ref 6.5–8.1)

## 2022-08-30 LAB — CBC
HCT: 25.7 % — ABNORMAL LOW (ref 39.0–52.0)
Hemoglobin: 8.5 g/dL — ABNORMAL LOW (ref 13.0–17.0)
MCH: 26.6 pg (ref 26.0–34.0)
MCHC: 33.1 g/dL (ref 30.0–36.0)
MCV: 80.6 fL (ref 80.0–100.0)
Platelets: 206 10*3/uL (ref 150–400)
RBC: 3.19 MIL/uL — ABNORMAL LOW (ref 4.22–5.81)
RDW: 18.3 % — ABNORMAL HIGH (ref 11.5–15.5)
WBC: 21.9 10*3/uL — ABNORMAL HIGH (ref 4.0–10.5)
nRBC: 0 % (ref 0.0–0.2)

## 2022-08-30 LAB — BASIC METABOLIC PANEL
Anion gap: 13 (ref 5–15)
Anion gap: 9 (ref 5–15)
BUN: 37 mg/dL — ABNORMAL HIGH (ref 8–23)
BUN: 36 mg/dL — ABNORMAL HIGH (ref 8–23)
CO2: 17 mmol/L — ABNORMAL LOW (ref 22–32)
CO2: 22 mmol/L (ref 22–32)
Calcium: 8 mg/dL — ABNORMAL LOW (ref 8.9–10.3)
Calcium: 8.1 mg/dL — ABNORMAL LOW (ref 8.9–10.3)
Chloride: 109 mmol/L (ref 98–111)
Chloride: 107 mmol/L (ref 98–111)
Creatinine, Ser: 2.38 mg/dL — ABNORMAL HIGH (ref 0.61–1.24)
Creatinine, Ser: 2.23 mg/dL — ABNORMAL HIGH (ref 0.61–1.24)
GFR, Estimated: 27 mL/min — ABNORMAL LOW (ref >60)
GFR, Estimated: 29 mL/min — ABNORMAL LOW (ref >60)
Glucose, Bld: 113 mg/dL — ABNORMAL HIGH (ref 70–99)
Glucose, Bld: 92 mg/dL (ref 70–99)
Potassium: 3.5 mmol/L (ref 3.5–5.1)
Potassium: 3.3 mmol/L — ABNORMAL LOW (ref 3.5–5.1)
Sodium: 139 mmol/L (ref 135–145)
Sodium: 138 mmol/L (ref 135–145)

## 2022-08-30 LAB — GLUCOSE, CAPILLARY
Glucose-Capillary: 98 mg/dL (ref 70–99)
Glucose-Capillary: 171 mg/dL — ABNORMAL HIGH (ref 70–99)
Glucose-Capillary: 93 mg/dL (ref 70–99)

## 2022-08-30 LAB — LACTIC ACID, PLASMA
Lactic Acid, Venous: 1.4 mmol/L (ref 0.5–1.9)
Lactic Acid, Venous: 1.2 mmol/L (ref 0.5–1.9)

## 2022-08-30 LAB — MRSA NEXT GEN BY PCR, NASAL: MRSA by PCR Next Gen: NOT DETECTED

## 2022-08-30 LAB — MAGNESIUM
Magnesium: 1.7 mg/dL (ref 1.7–2.4)
Magnesium: 1.7 mg/dL (ref 1.7–2.4)
Magnesium: 2.1 mg/dL (ref 1.7–2.4)

## 2022-08-30 MED ORDER — VANCOMYCIN VARIABLE DOSE PER UNSTABLE RENAL FUNCTION (PHARMACIST DOSING): Status: DC

## 2022-08-30 MED ORDER — SODIUM CHLORIDE 0.9 % IV SOLN: 1.0000 g | INTRAVENOUS | Status: DC

## 2022-08-30 MED ORDER — SODIUM CHLORIDE 0.9 % IV SOLN
2.0000 g | INTRAVENOUS | Status: DC
  Administered 2022-08-30 – 2022-09-03 (×5): 2 g via INTRAVENOUS
  Filled 2022-08-30 (×5): qty 20

## 2022-08-30 MED ORDER — ASPIRIN 81 MG PO TBEC
81.0000 mg | DELAYED_RELEASE_TABLET | Freq: Every day | ORAL | Status: DC
  Administered 2022-08-30 – 2022-09-04 (×6): 81 mg via ORAL
  Filled 2022-08-30 (×6): qty 1

## 2022-08-30 MED ORDER — LEVETIRACETAM 250 MG PO TABS: 500.0000 mg | ORAL_TABLET | Freq: Two times a day (BID) | ORAL | Status: DC

## 2022-08-30 MED ORDER — POTASSIUM CHLORIDE CRYS ER 20 MEQ PO TBCR
20.0000 meq | EXTENDED_RELEASE_TABLET | Freq: Once | ORAL | Status: AC
  Administered 2022-08-30: 20 meq via ORAL
  Filled 2022-08-30: qty 1

## 2022-08-30 MED ORDER — ONDANSETRON HCL 4 MG/2ML IJ SOLN: 4.0000 mg | Freq: Four times a day (QID) | INTRAMUSCULAR | Status: DC | PRN

## 2022-08-30 MED ORDER — PANTOPRAZOLE SODIUM 40 MG IV SOLR
40.0000 mg | INTRAVENOUS | Status: DC
  Administered 2022-08-30 – 2022-08-31 (×2): 40 mg via INTRAVENOUS
  Filled 2022-08-30 (×2): qty 10

## 2022-08-30 MED ORDER — POTASSIUM CHLORIDE 10 MEQ/100ML IV SOLN
INTRAVENOUS | Status: AC
  Administered 2022-08-30: 10 meq via INTRAVENOUS
  Filled 2022-08-30: qty 100

## 2022-08-30 MED ORDER — ONDANSETRON HCL 4 MG PO TABS: 4.0000 mg | ORAL_TABLET | Freq: Four times a day (QID) | ORAL | Status: DC | PRN

## 2022-08-30 MED ORDER — NOREPINEPHRINE 4 MG/250ML-% IV SOLN
2.0000 ug/min | INTRAVENOUS | Status: DC
  Administered 2022-08-30: 10 ug/min via INTRAVENOUS
  Filled 2022-08-30 (×2): qty 250

## 2022-08-30 MED ORDER — ORAL CARE MOUTH RINSE
15.0000 mL | OROMUCOSAL | Status: DC
  Administered 2022-08-30 – 2022-09-04 (×17): 15 mL via OROMUCOSAL

## 2022-08-30 MED ORDER — ATORVASTATIN CALCIUM 40 MG PO TABS
40.0000 mg | ORAL_TABLET | Freq: Every day | ORAL | Status: DC
  Administered 2022-08-30 – 2022-09-04 (×6): 40 mg via ORAL
  Filled 2022-08-30 (×6): qty 1

## 2022-08-30 MED ORDER — ORAL CARE MOUTH RINSE: 15.0000 mL | OROMUCOSAL | Status: DC | PRN

## 2022-08-30 MED ORDER — CILOSTAZOL 50 MG PO TABS
50.0000 mg | ORAL_TABLET | Freq: Two times a day (BID) | ORAL | Status: DC
  Administered 2022-08-30 – 2022-09-04 (×10): 50 mg via ORAL
  Filled 2022-08-30 (×14): qty 1

## 2022-08-30 MED ORDER — ACETAMINOPHEN 650 MG RE SUPP
650.0000 mg | Freq: Four times a day (QID) | RECTAL | Status: DC | PRN
  Filled 2022-08-30: qty 1

## 2022-08-30 MED ORDER — SODIUM CHLORIDE 0.9 % IV BOLUS
1000.0000 mL | Freq: Once | INTRAVENOUS | Status: AC
  Administered 2022-08-30: 1000 mL via INTRAVENOUS

## 2022-08-30 MED ORDER — POTASSIUM CHLORIDE 10 MEQ/100ML IV SOLN
10.0000 meq | INTRAVENOUS | Status: AC
  Administered 2022-08-30 (×5): 10 meq via INTRAVENOUS
  Filled 2022-08-30 (×5): qty 100

## 2022-08-30 MED ORDER — SENNOSIDES-DOCUSATE SODIUM 8.6-50 MG PO TABS: 1.0000 | ORAL_TABLET | Freq: Every evening | ORAL | Status: DC | PRN

## 2022-08-30 MED ORDER — LEVETIRACETAM IN NACL 500 MG/100ML IV SOLN
500.0000 mg | Freq: Two times a day (BID) | INTRAVENOUS | Status: DC
  Administered 2022-08-30 – 2022-08-31 (×3): 500 mg via INTRAVENOUS
  Filled 2022-08-30 (×3): qty 100

## 2022-08-30 MED ORDER — MAGNESIUM SULFATE 2 GM/50ML IV SOLN
2.0000 g | Freq: Once | INTRAVENOUS | Status: AC
  Administered 2022-08-30: 2 g via INTRAVENOUS
  Filled 2022-08-30: qty 50

## 2022-08-30 MED ORDER — VANCOMYCIN HCL 1500 MG/300ML IV SOLN
1500.0000 mg | Freq: Once | INTRAVENOUS | Status: AC
  Administered 2022-08-30: 1500 mg via INTRAVENOUS
  Filled 2022-08-30 (×3): qty 300

## 2022-08-30 MED ORDER — AMLODIPINE BESYLATE 10 MG PO TABS: 10.0000 mg | ORAL_TABLET | Freq: Every day | ORAL | Status: DC

## 2022-08-30 MED ORDER — ACETAMINOPHEN 325 MG PO TABS
650.0000 mg | ORAL_TABLET | Freq: Four times a day (QID) | ORAL | Status: DC | PRN
  Administered 2022-09-03: 650 mg via ORAL
  Filled 2022-08-30: qty 2

## 2022-08-30 MED ORDER — SODIUM CHLORIDE 0.9% FLUSH
3.0000 mL | Freq: Two times a day (BID) | INTRAVENOUS | Status: DC
  Administered 2022-08-30 – 2022-09-04 (×10): 3 mL via INTRAVENOUS

## 2022-08-30 MED ORDER — CHLORHEXIDINE GLUCONATE CLOTH 2 % EX PADS
6.0000 | MEDICATED_PAD | Freq: Every day | CUTANEOUS | Status: DC
  Administered 2022-08-30 – 2022-09-04 (×6): 6 via TOPICAL

## 2022-08-30 MED ORDER — PANTOPRAZOLE SODIUM 40 MG PO TBEC: 40.0000 mg | DELAYED_RELEASE_TABLET | Freq: Two times a day (BID) | ORAL | Status: DC

## 2022-08-30 MED ORDER — HEPARIN SODIUM (PORCINE) 5000 UNIT/ML IJ SOLN
5000.0000 [IU] | Freq: Three times a day (TID) | INTRAMUSCULAR | Status: DC
  Administered 2022-08-30 – 2022-09-04 (×15): 5000 [IU] via SUBCUTANEOUS
  Filled 2022-08-30 (×16): qty 1

## 2022-08-30 MED ORDER — TAMSULOSIN HCL 0.4 MG PO CAPS
0.4000 mg | ORAL_CAPSULE | Freq: Every day | ORAL | Status: DC
  Administered 2022-08-30 – 2022-09-03 (×5): 0.4 mg via ORAL
  Filled 2022-08-30 (×4): qty 1

## 2022-08-30 MED ORDER — LACTATED RINGERS IV BOLUS
1000.0000 mL | Freq: Once | INTRAVENOUS | Status: AC
  Administered 2022-08-30: 1000 mL via INTRAVENOUS

## 2022-08-30 MED ORDER — SODIUM CHLORIDE 0.9 % IV SOLN
250.0000 mL | INTRAVENOUS | Status: DC
  Administered 2022-08-30: 250 mL via INTRAVENOUS

## 2022-08-30 NOTE — Significant Event (Signed)
Rapid Response Event Note   Reason for Call :  Hypotension  Initial Focused Assessment:  Patient is minimally responsive to deep painful stim.  He is hot to the touch. Lung sounds with scattered rhonchi Heart tones regular Occasional PVC, 1 run VT 10 beats  BP 44/34  Hr 109  RR 16  O2 sat 99% on 4L [place]  Rectal temp 101.3 K+ 2.4  Dr Waldron Labs at bedside to assess patient  Interventions:  2L NS Rapid bolus 1L LR bolus Levophed gtt started at 27mg, titrated to 524m 1076mPotassium IV  CCM consulted: Brooke and Dr HunSilas Flood bedside to assess patient  IV team at bedside to place ultrasound IV Lactic drawn ABG drawn  BP 88/45  HR 104  after NS bolus finished.  Increased Levophed to 8m27m [facility] [place] [id number]  Per MD patient's daughter is on her way to the hospital.   Event Summary:   MD Notified: Elger[place]y Call Time: [id number] Time: [id number] Time: 0900  LaytRaliegh Ip

## 2022-08-30 NOTE — ED Notes (Signed)
Pt daughter [name] called and notified of plan of care.  [facility] at bedside to transport pt to [facility]

## 2022-08-30 NOTE — Progress Notes (Signed)
Upon shift change, bed report, patient was very fatigued, mostly not responding to questions even with painful stimuli, bp's trending very soft, systolic 29-09'M, tachycardia and rectal temp 101.3, cbg is 98---rapid nurse called and Dr paged to room---3L bolus of IV fluids (2 NS and 1 LR) given, Levo started at 10 with IV team start--US guided IV--critical lab potassium reported to me at 2.4, doctor advised, he has also ordered 6 runs potassium, I have started 1 bag potassium---called report to [place] and patient transferred to 2H-03

## 2022-08-30 NOTE — Progress Notes (Signed)
An USGPIV (ultrasound guided PIV) has been placed for short-term vasopressor infusion. A correctly placed ivWatch must be used when administering Vasopressors. Should this treatment be needed beyond 72 hours, central line access should be obtained.  It will be the responsibility of the bedside nurse to follow best practice to prevent extravasations.  HS Lilymae Swiech RN 

## 2022-08-30 NOTE — Consult Note (Signed)
NAME:  Andres White, MRN:  [id number], DOB:  11-20-1945, LOS: 0 ADMISSION DATE:  08/29/2022, CONSULTATION DATE:  08/30/22 REFERRING MD:  Dr. Waldron Labs, CHIEF COMPLAINT:  septic shock   History of Present Illness:  HPI obtained from EMR review and bedside staff as patient is encephalopathic and unable to provide.   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  ER workup found pt afebrile and normotensive, oriented x2, found with urosepsis with encephalopathy and AKI.  Admitted to [facility] but became progressive hypotensive and altered requiring RRT for SBP in the 50's.  Given additional 2L boluses and started on peripheral NE with improving mental status.  PCCM consulted for further ICU management.   Pertinent  Medical History   Past Medical History:  Diagnosis Date   Arthritis    Chronic back pain    Duodenal adenoma    Gout    Hypertension    Stroke ([place]) 06/2020   Syncope and collapse    Significant Hospital Events: Including procedures, antibiotic start and stop dates in addition to other pertinent events   1/27 admitted urosepsis, aki> hypotensive> NE/ ICU tx   Interim History / Subjective:   Objective   Blood pressure (!) 112/57, pulse (!) 112, temperature 97.6 F (36.4 C), temperature source Oral, resp. rate (!) 22, height '5\' 7"'$  (1.702 m), weight 79.5 kg, SpO2 99 %.        Intake/Output Summary (Last 24 hours) at 08/30/2022 0830 Last data filed at 08/30/2022 0450 Gross per 24 hour  Intake 2305.87 ml  Output --  Net 2305.87 ml   Filed Weights   08/29/22 1628 08/30/22 0109  Weight: 76.9 kg 79.5 kg   Examination: General:  AoC ill appearing elderly male lying in bed in NAD HEENT: MM pink/dry, pupils 4/reactive,  anicteric Neuro: Lethargic, awakens to verbal and follow simple commands in all extremities, R sided weaker CV: rr, NSR, no obvious murmur, +1 distal pulses  PULM:  non labored, 99% on 4L , scattered rhonchi, no rales GI: soft, bs+, NT/ ND Extremities: warm/dry, no LE edema  Skin: no rashes, dry skin  Labs reviewed CXR> mild left basilar atelectasis   Resolved Hospital Problem list    Assessment & Plan:   Septic shock 2/2 UTI - tx to ICU - broaden abx given decompensation, change ctx to 2gm q 24hrs and add vanc for now> either under fluid resuscitated or infection possibly not covered by CTX alone - looks dry, s/p 2L NS, ok for 3rd, hold 4th - cont peripheral NE for MAP goal > 65 - trend lactic acid - tylenol prn fever/ pain - follow culture data - trend WBC/ fever curve  Hypoxia? - unclear if ever hypoxic, no documented hypoxia  - CXR with mild left basilar atelectasis on admit.  Rhonchi now, needs to cough - check ABG - intubation risk given encephalopathy - NPO  - aggressive pulm hygiene, IS/ PT when able  - Wean prn O2 for sat goal > 92% - aspiration risk - CXR if increasing O2 requirements/ prn   Acute metabolic/ septic encephalopathy Hx seizures, brainstem CVA with residual R sided hemiparesis Mild dementia  - supportive care - infectious treatment as above - keppra IV while encephalopathic  - cont ASA/ statin  as able  - maintain neuro protective measures; goal for eurothermia, euglycemia, eunatermia, normoxia, and PCO2 goal of 35-40 - Seizure/ aspiration precautions  - PT/ OT when able   AKI on CKD3B, likely pre-renal due to poor PO intake (baseline 1.46- 1.63 Hypokalemia Hypomagnesia  - IVF as above - s/p mag and KCL replete  - repeat BMET at 1600  - Trend BMP / urinary output - Replace electrolytes as indicated - Avoid nephrotoxic agents, ensure adequate renal perfusion  Hypertension HFpEF - hold home lasix, norvasc in the setting of shock -  monitor volume status with aggressive fluid resuscitation   Chronic normocytic anemia - trend CBC, transfuse for Hgb < 7 or significant bleeding  Best Practice (right click and "Reselect all SmartList Selections" daily)   Diet/type: NPO DVT prophylaxis: prophylactic heparin  GI prophylaxis: PPI Lines: N/A Foley:  N/A Code Status:  full code Last date of multidisciplinary goals of care discussion [1/27]  Daughter updated by phone per [place] at bedside, aware of change and pending tx to ICU   Labs   CBC: Recent Labs  Lab 08/26/22 1407 08/29/22 1728 08/29/22 1734 08/30/22 0638  WBC 6.7 8.7  --  21.9*  NEUTROABS  --  6.4  --   --   HGB 9.7* 10.1* 11.6* 8.5*  HCT 31.4* 32.6* 34.0* 25.7*  MCV 82 81.9  --  80.6  PLT 326 306  --  174    Basic Metabolic Panel: Recent Labs  Lab 08/26/22 1407 08/29/22 1728 08/29/22 1734 08/30/22 0638  NA 144 138 142 141  K 3.2* 2.3* 2.3* 2.4*  CL 100 98  --  106  CO2 23 26  --  24  GLUCOSE 95 127*  --  104*  BUN 57* 45*  --  41*  CREATININE 2.70* 2.90*  --  2.84*  CALCIUM 9.3 8.6*  --  8.6*  MG  --  1.6*  --  1.7   GFR: Estimated Creatinine Clearance: 21.3 mL/min (A) (by C-G formula based on SCr of 2.84 mg/dL (H)). Recent Labs  Lab 08/26/22 1407 08/29/22 1728 08/29/22 1937 08/30/22 0638  WBC 6.7 8.7  --  21.9*  LATICACIDVEN  --  2.6* 1.7  --     Liver Function Tests: Recent Labs  Lab 08/26/22 1407 08/29/22 1728 08/30/22 0638  AST 81* 54* 38  ALT 33 28 20  ALKPHOS 105 94 76  BILITOT 0.5 0.8 0.6  PROT 7.8 8.8* 6.6  ALBUMIN 3.4* 3.2* 2.4*   No results for input(s): "LIPASE", "AMYLASE" in the last 168 hours. No results for input(s): "AMMONIA" in the last 168 hours.  ABG    Component Value Date/Time   PHART 7.459 (H) 09/17/2020 0431   PCO2ART 32.6 09/17/2020 0431   PO2ART 316 (H) 09/17/2020 0431   HCO3 27.9 08/29/2022 1734   TCO2 29 08/29/2022 1734   ACIDBASEDEF 4.0 (H) 09/17/2020 0420   O2SAT 50 08/29/2022 1734      Coagulation Profile: No results for input(s): "INR", "PROTIME" in the last 168 hours.  Cardiac Enzymes: No results for input(s): "CKTOTAL", "CKMB", "CKMBINDEX", "TROPONINI" in the last 168 hours.  HbA1C: Hgb A1c MFr Bld  Date/Time Value Ref Range Status  09/17/2020 11:07 AM 4.5 (L) 4.8 - 5.6 % Final    Comment:    (NOTE)         Prediabetes: 5.7 - 6.4         Diabetes: >6.4  Glycemic control for adults with diabetes: <7.0   06/25/2020 04:41 AM 4.8 4.8 - 5.6 % Final    Comment:    (NOTE) Pre diabetes:          5.7%-6.4%  Diabetes:              >6.4%  Glycemic control for   <7.0% adults with diabetes     CBG: No results for input(s): "GLUCAP" in the last 168 hours.  Review of Systems:   Unable to assess  Past Medical History:  He,  has a past medical history of Arthritis, Chronic back pain, Duodenal adenoma, Gout, Hypertension, Stroke ([place]) (06/2020), and Syncope and collapse.   Surgical History:   Past Surgical History:  Procedure Laterality Date   BIOPSY  07/13/2020   Procedure: BIOPSY;  Surgeon: Ladene Artist, MD;  Location: [facility];  Service: Endoscopy;;   BIOPSY  11/28/2020   Procedure: BIOPSY;  Surgeon: Irving Copas., MD;  Location: [facility];  Service: Gastroenterology;;   CARDIAC CATHETERIZATION  2015   [facility]    COLONOSCOPY WITH PROPOFOL N/A 07/13/2020   Procedure: COLONOSCOPY WITH PROPOFOL;  Surgeon: Ladene Artist, MD;  Location[facility];  Service: Endoscopy;  Laterality: N/A;   COLONOSCOPY WITH PROPOFOL N/A 11/28/2020   Procedure: COLONOSCOPY WITH PROPOFOL;  Surgeon: Rush Landmark Telford Nab., MD;  Location: [facility] ENDOSCOPY;  Service: Gastroenterology;  Laterality: N/A;   ENDOSCOPIC MUCOSAL RESECTION N/A 11/28/2020   Procedure: ENDOSCOPIC MUCOSAL RESECTION;  Surgeon: Rush Landmark Telford Nab., MD;  Location: [facility] ENDOSCOPY;  Service: Gastroenterology;  Laterality: N/A;   ESOPHAGOGASTRODUODENOSCOPY N/A 07/13/2020   Procedure:  ESOPHAGOGASTRODUODENOSCOPY (EGD);  Surgeon: Ladene Artist, MD;  Location: [facility] ENDOSCOPY;  Service: Endoscopy;  Laterality: N/A;   ESOPHAGOGASTRODUODENOSCOPY (EGD) WITH PROPOFOL N/A 11/28/2020   Procedure: ESOPHAGOGASTRODUODENOSCOPY (EGD) WITH PROPOFOL;  Surgeon: Rush Landmark Telford Nab., MD;  Location: [facility] ENDOSCOPY;  Service: Gastroenterology;  Laterality: N/A;   HEMOSTASIS CLIP PLACEMENT  11/28/2020   Procedure: HEMOSTASIS CLIP PLACEMENT;  Surgeon: Irving Copas., MD;  Location: [facility];  Service: Gastroenterology;;   KNEE SURGERY Right    POLYPECTOMY  07/13/2020   Procedure: POLYPECTOMY;  Surgeon: Ladene Artist, MD;  Location: [facility];  Service: Endoscopy;;   POLYPECTOMY  11/28/2020   Procedure: POLYPECTOMY;  Surgeon: Irving Copas., MD;  Location: [facility];  Service: Gastroenterology;;   SUBMUCOSAL LIFTING INJECTION  11/28/2020   Procedure: SUBMUCOSAL LIFTING INJECTION;  Surgeon: Irving Copas., MD;  Location: [facility];  Service: Gastroenterology;;   SUBMUCOSAL TATTOO INJECTION  11/28/2020   Procedure: SUBMUCOSAL TATTOO INJECTION;  Surgeon: Irving Copas., MD;  Location: [facility] ENDOSCOPY;  Service: Gastroenterology;;     Social History:   reports that he has been smoking cigarettes. He has a 20.00 pack-year smoking history. He has been exposed to tobacco smoke. He has never used smokeless tobacco. He reports that he does not currently use alcohol. He reports that he does not use drugs.   Family History:  His family history includes Hypertension in his mother. There is no history of Colon cancer, Pancreatic cancer, Stomach cancer, Esophageal cancer, Inflammatory bowel disease, Liver disease, or Rectal cancer.   Allergies No Known Allergies   Home Medications  Prior to Admission medications   Medication Sig Start Date End Date Taking? Authorizing Provider  acetaminophen (TYLENOL) 325 MG tablet Take 1-2 tablets (325-650 mg total) by mouth  every 4 (four) hours as needed for mild pain. 07/05/20   Bary Leriche, PA-C  allopurinol (ZYLOPRIM) 100 MG tablet TAKE 1 TABLET(100 MG) BY MOUTH DAILY 07/14/22   Raulkar, Clide Deutscher, MD  amLODipine (NORVASC) 10 MG tablet Take 1 tablet (10 mg total) by mouth daily. 04/15/22   Marrian Salvage, FNP  aspirin EC 81 MG tablet Take 1 tablet (81 mg total) by mouth daily. Patient taking differently: Take 81 mg by mouth in the morning. 07/18/20   Love, [name], PA-C  atorvastatin (LIPITOR) 40 MG tablet TAKE 1 TABLET BY MOUTH ONCE DAILY 04/10/22   Raulkar, Clide Deutscher, MD  cilostazol (PLETAL) 50 MG tablet TAKE 1 TABLET BY MOUTH TWICE DAILY 06/18/22   Raulkar, Clide Deutscher, MD  citalopram (CELEXA) 20 MG tablet Take 1 tablet (20 mg total) by mouth daily. 04/15/22   Marrian Salvage, FNP  colchicine 0.6 MG tablet Take 1 tablet (0.6 mg total) by mouth daily. As needed 04/15/22   Marrian Salvage, FNP  furosemide (LASIX) 20 MG tablet TAKE 1 TABLET BY MOUTH EVERY MORNING 02/26/22   O'Neal, Cassie Freer, MD  gabapentin (NEURONTIN) 300 MG capsule Take 1 capsule (300 mg total) by mouth 2 (two) times daily. Reduce gabapentin to 300 mg twice daily for 1 week followed by once daily for a week and stop 08/26/22   Garvin Fila, MD  hydrocerin (EUCERIN) CREA Apply 1 application. topically 2 (two) times daily. 12/10/21   Marrian Salvage, FNP  levETIRAcetam (KEPPRA) 500 MG tablet Take 1 tablet (500 mg total) by mouth 2 (two) times daily. 08/05/22   Frann Rider, NP  Multiple Vitamin (MULTIVITAMIN WITH MINERALS) TABS tablet Take 1 tablet by mouth daily. Patient taking differently: Take 1 tablet by mouth daily at 12 noon. 07/19/20   Love, [name], PA-C  pantoprazole (PROTONIX) 40 MG tablet TAKE 1 TABLET BY MOUTH TWICE DAILY 04/10/22   Marrian Salvage, FNP  tamsulosin (FLOMAX) 0.4 MG CAPS capsule TAKE 2 CAPSULES BY MOUTH ONCE DAILY AFTER SUPPER 05/09/22   Raulkar, Clide Deutscher, MD  vitamin B-12 (CYANOCOBALAMIN)  1000 MCG tablet Take 1,000 mcg by mouth daily.    [provider]     Critical care time: 45 mins     Amanda Cockayne [facility] 08/30/2022, 8:31 AM  See Amion for pager If no response to pager, please call PCCM consult pager After 7:00 pm call [place]

## 2022-08-30 NOTE — H&P (Signed)
History and Physical    KANDEN CAREY XTK:[id number] DOB: May 07, 1946 DOA: 08/29/2022  PCP: Marrian Salvage, FNP   Patient coming from: Home   Chief Complaint: Lethargy, general weakness   HPI: Andres White is a 77 y.o. male with medical history significant for brainstem stroke, seizures, chronic HFpEF, hypertension, CKD 3B, and chronic back pain, now presenting to the emergency department for evaluation of generalized weakness and lethargy.  Patient's family provided most of the history and notes that the patient has been more weak and lethargic since he had an upper respiratory illness 3 or 4 weeks ago.  He was seen by neurology for this, had blood work and urinalysis, and was notified of concern for UTI and acute kidney injury.  He initially presented to his PCP office for evaluation of these symptoms but was directed to the ED from there.  [facility] ED Course: Upon arrival to the ED, patient is found to be afebrile and saturating 100% on 4 L/min of supplemental oxygen with stable blood pressure.  Head CT is negative for acute intracranial abnormality and CT renal stone study is negative for obstructive uropathy.  Labs are most notable for potassium 2.3, BUN 45, creatinine 2.90, and lactic acid 2.6.  Blood cultures were collected in the ED and the patient was treated with IV magnesium, IV potassium, 1 L of normal saline, and Rocephin.  He was transferred to [facility] for admission.  Review of Systems:  Unable to complete ROS secondary to patient's clinical condition.  Past Medical History:  Diagnosis Date   Arthritis    Chronic back pain    Duodenal adenoma    Gout    Hypertension    Stroke ([place]) 06/2020   Syncope and collapse     Past Surgical History:  Procedure Laterality Date   BIOPSY  07/13/2020   Procedure: BIOPSY;  Surgeon: Ladene Artist, MD;  Location: [facility] ENDOSCOPY;  Service: Endoscopy;;   BIOPSY  11/28/2020   Procedure: BIOPSY;  Surgeon: Irving Copas., MD;  Location: [facility] ENDOSCOPY;  Service: Gastroenterology;;   CARDIAC CATHETERIZATION  2015   [facility]    COLONOSCOPY WITH PROPOFOL N/A 07/13/2020   Procedure: COLONOSCOPY WITH PROPOFOL;  Surgeon: Ladene Artist, MD;  Location: [facility] ENDOSCOPY;  Service: Endoscopy;  Laterality: N/A;   COLONOSCOPY WITH PROPOFOL N/A 11/28/2020   Procedure: COLONOSCOPY WITH PROPOFOL;  Surgeon: Rush Landmark Telford Nab., MD;  Location: [facility] ENDOSCOPY;  Service: Gastroenterology;  Laterality: N/A;   ENDOSCOPIC MUCOSAL RESECTION N/A 11/28/2020   Procedure: ENDOSCOPIC MUCOSAL RESECTION;  Surgeon: Rush Landmark Telford Nab., MD;  Location: [facility] ENDOSCOPY;  Service: Gastroenterology;  Laterality: N/A;   ESOPHAGOGASTRODUODENOSCOPY N/A 07/13/2020   Procedure: ESOPHAGOGASTRODUODENOSCOPY (EGD);  Surgeon: Ladene Artist, MD;  Location: [facility] ENDOSCOPY;  Service: Endoscopy;  Laterality: N/A;   ESOPHAGOGASTRODUODENOSCOPY (EGD) WITH PROPOFOL N/A 11/28/2020   Procedure: ESOPHAGOGASTRODUODENOSCOPY (EGD) WITH PROPOFOL;  Surgeon: Rush Landmark Telford Nab., MD;  Location: [facility] ENDOSCOPY;  Service: Gastroenterology;  Laterality: N/A;   HEMOSTASIS CLIP PLACEMENT  11/28/2020   Procedure: HEMOSTASIS CLIP PLACEMENT;  Surgeon: Irving Copas., MD;  Location: [facility] ENDOSCOPY;  Service: Gastroenterology;;   KNEE SURGERY Right    POLYPECTOMY  07/13/2020   Procedure: POLYPECTOMY;  Surgeon: Ladene Artist, MD;  Location: [facility];  Service: Endoscopy;;   POLYPECTOMY  11/28/2020   Procedure: POLYPECTOMY;  Surgeon: Irving Copas., MD;  Location: [facility] ENDOSCOPY;  Service: Gastroenterology;;   SUBMUCOSAL LIFTING INJECTION  11/28/2020   Procedure: SUBMUCOSAL LIFTING  INJECTION;  Surgeon: Rush Landmark Telford Nab., MD;  Location: [facility] ENDOSCOPY;  Service: Gastroenterology;;   SUBMUCOSAL TATTOO INJECTION  11/28/2020   Procedure: SUBMUCOSAL TATTOO INJECTION;  Surgeon: Irving Copas., MD;  Location: [facility] ENDOSCOPY;  Service: Gastroenterology;;     Social History:   reports that he has been smoking cigarettes. He has a 20.00 pack-year smoking history. He has been exposed to tobacco smoke. He has never used smokeless tobacco. He reports that he does not currently use alcohol. He reports that he does not use drugs.  No Known Allergies  Family History  Problem Relation Age of Onset   Hypertension Mother    Colon cancer Neg Hx    Pancreatic cancer Neg Hx    Stomach cancer Neg Hx    Esophageal cancer Neg Hx    Inflammatory bowel disease Neg Hx    Liver disease Neg Hx    Rectal cancer Neg Hx      Prior to Admission medications   Medication Sig Start Date End Date Taking? Authorizing Provider  acetaminophen (TYLENOL) 325 MG tablet Take 1-2 tablets (325-650 mg total) by mouth every 4 (four) hours as needed for mild pain. 07/05/20   Love, [name], PA-C  allopurinol (ZYLOPRIM) 100 MG tablet TAKE 1 TABLET(100 MG) BY MOUTH DAILY 07/14/22   Raulkar, Clide Deutscher, MD  amLODipine (NORVASC) 10 MG tablet Take 1 tablet (10 mg total) by mouth daily. 04/15/22   Marrian Salvage, FNP  aspirin EC 81 MG tablet Take 1 tablet (81 mg total) by mouth daily. Patient taking differently: Take 81 mg by mouth in the morning. 07/18/20   Love, [name], PA-C  atorvastatin (LIPITOR) 40 MG tablet TAKE 1 TABLET BY MOUTH ONCE DAILY 04/10/22   Raulkar, Clide Deutscher, MD  cilostazol (PLETAL) 50 MG tablet TAKE 1 TABLET BY MOUTH TWICE DAILY 06/18/22   Raulkar, Clide Deutscher, MD  citalopram (CELEXA) 20 MG tablet Take 1 tablet (20 mg total) by mouth daily. 04/15/22   Marrian Salvage, FNP  colchicine 0.6 MG tablet Take 1 tablet (0.6 mg total) by mouth daily. As needed 04/15/22   Marrian Salvage, FNP  furosemide (LASIX) 20 MG tablet TAKE 1 TABLET BY MOUTH EVERY MORNING 02/26/22   O'Neal, Cassie Freer, MD  gabapentin (NEURONTIN) 300 MG capsule Take 1 capsule (300 mg total) by mouth 2 (two) times daily. Reduce gabapentin to 300 mg twice daily for 1 week followed by  once daily for a week and stop 08/26/22   Garvin Fila, MD  hydrocerin (EUCERIN) CREA Apply 1 application. topically 2 (two) times daily. 12/10/21   Marrian Salvage, FNP  levETIRAcetam (KEPPRA) 500 MG tablet Take 1 tablet (500 mg total) by mouth 2 (two) times daily. 08/05/22   Frann Rider, NP  Multiple Vitamin (MULTIVITAMIN WITH MINERALS) TABS tablet Take 1 tablet by mouth daily. Patient taking differently: Take 1 tablet by mouth daily at 12 noon. 07/19/20   Love, [name], PA-C  pantoprazole (PROTONIX) 40 MG tablet TAKE 1 TABLET BY MOUTH TWICE DAILY 04/10/22   Marrian Salvage, FNP  tamsulosin (FLOMAX) 0.4 MG CAPS capsule TAKE 2 CAPSULES BY MOUTH ONCE DAILY AFTER SUPPER 05/09/22   Raulkar, Clide Deutscher, MD  vitamin B-12 (CYANOCOBALAMIN) 1000 MCG tablet Take 1,000 mcg by mouth daily.    [provider]    Physical Exam: Vitals:   08/29/22 2130 08/29/22 2215 08/29/22 2315 08/30/22 0109  BP: 103/61 (!) 113/58 (!) 144/67 (!) 146/97  Pulse: [name])  50 (!) 47 (!) 47   Resp: 14 14 (!) 31 19  Temp:    98.5 F (36.9 C)  TempSrc:    Oral  SpO2: 94% 100% 99%   Weight:    79.5 kg  Height:    '5\' 7"'$  (1.702 m)    Constitutional: NAD, no pallor or diaphoresis  Eyes: PERTLA, lids and conjunctivae normal ENMT: Mucous membranes are moist. Posterior pharynx clear of any exudate or lesions.   Neck: supple, no masses  Respiratory: no wheezing, no crackles. No accessory muscle use.  Cardiovascular: S1 & S2 heard, regular rate and rhythm. No extremity edema.   Abdomen: No distension, no tenderness, soft. Bowel sounds active.  Musculoskeletal: no clubbing / cyanosis. No joint deformity upper and lower extremities.   Skin: no significant rashes, lesions, ulcers. Warm, dry, well-perfused. Neurologic: Sleeping, wakes to voice and oriented to person only. CN 2-12 grossly intact. RUE weakness.   Psychiatric: Calm. Cooperative.    Labs and Imaging on Admission: I have personally reviewed  following labs and imaging studies  CBC: Recent Labs  Lab 08/26/22 1407 08/29/22 1728 08/29/22 1734  WBC 6.7 8.7  --   NEUTROABS  --  6.4  --   HGB 9.7* 10.1* 11.6*  HCT 31.4* 32.6* 34.0*  MCV 82 81.9  --   PLT 326 306  --    Basic Metabolic Panel: Recent Labs  Lab 08/26/22 1407 08/29/22 1728 08/29/22 1734  NA 144 138 142  K 3.2* 2.3* 2.3*  CL 100 98  --   CO2 23 26  --   GLUCOSE 95 127*  --   BUN 57* 45*  --   CREATININE 2.70* 2.90*  --   CALCIUM 9.3 8.6*  --   MG  --  1.6*  --    GFR: Estimated Creatinine Clearance: 20.9 mL/min (A) (by C-G formula based on SCr of 2.9 mg/dL (H)). Liver Function Tests: Recent Labs  Lab 08/26/22 1407 08/29/22 1728  AST 81* 54*  ALT 33 28  ALKPHOS 105 94  BILITOT 0.5 0.8  PROT 7.8 8.8*  ALBUMIN 3.4* 3.2*   No results for input(s): "LIPASE", "AMYLASE" in the last 168 hours. No results for input(s): "AMMONIA" in the last 168 hours. Coagulation Profile: No results for input(s): "INR", "PROTIME" in the last 168 hours. Cardiac Enzymes: No results for input(s): "CKTOTAL", "CKMB", "CKMBINDEX", "TROPONINI" in the last 168 hours. BNP (last 3 results) No results for input(s): "PROBNP" in the last 8760 hours. HbA1C: No results for input(s): "HGBA1C" in the last 72 hours. CBG: No results for input(s): "GLUCAP" in the last 168 hours. Lipid Profile: No results for input(s): "CHOL", "HDL", "LDLCALC", "TRIG", "CHOLHDL", "LDLDIRECT" in the last 72 hours. Thyroid Function Tests: No results for input(s): "TSH", "T4TOTAL", "FREET4", "T3FREE", "THYROIDAB" in the last 72 hours. Anemia Panel: No results for input(s): "VITAMINB12", "FOLATE", "FERRITIN", "TIBC", "IRON", "RETICCTPCT" in the last 72 hours. Urine analysis:    Component Value Date/Time   COLORURINE YELLOW 08/29/2022 1728   APPEARANCEUR CLOUDY (A) 08/29/2022 1728   APPEARANCEUR Cloudy (A) 08/26/2022 1407   LABSPEC 1.010 08/29/2022 1728   LABSPEC 1.010 03/23/2014 2009   PHURINE  5.5 08/29/2022 1728   GLUCOSEU NEGATIVE 08/29/2022 1728   GLUCOSEU Negative 03/23/2014 2009   HGBUR TRACE (A) 08/29/2022 1728   BILIRUBINUR NEGATIVE 08/29/2022 1728   BILIRUBINUR Negative 08/26/2022 1407   BILIRUBINUR Negative 03/23/2014 2009   KETONESUR NEGATIVE 08/29/2022 1728   PROTEINUR NEGATIVE 08/29/2022 1728  NITRITE NEGATIVE 08/29/2022 1728   LEUKOCYTESUR LARGE (A) 08/29/2022 1728   LEUKOCYTESUR Negative 03/23/2014 2009   Sepsis Labs: '@LABRCNTIP'$ (procalcitonin:4,lacticidven:4) ) Recent Results (from the past 240 hour(s))  Microscopic Examination     Status: Abnormal   Collection Time: 08/26/22  2:07 PM  Result Value Ref Range Status   WBC, UA >30 (A) 0 - 5 /hpf Final   RBC, Urine 0-2 0 - 2 /hpf Final   Epithelial Cells (non renal) None seen 0 - 10 /hpf Final   Casts None seen None seen /lpf Final   Bacteria, UA Many (A) None seen/Few Final  Resp panel by RT-PCR (RSV, Flu A&B, Covid) Anterior Nasal Swab     Status: None   Collection Time: 08/29/22  5:28 PM   Specimen: Anterior Nasal Swab  Result Value Ref Range Status   SARS Coronavirus 2 by RT PCR NEGATIVE NEGATIVE Final    Comment: (NOTE) SARS-CoV-2 target nucleic acids are NOT DETECTED.  The SARS-CoV-2 RNA is generally detectable in upper respiratory specimens during the acute phase of infection. The lowest concentration of SARS-CoV-2 viral copies this assay can detect is 138 copies/mL. A negative result does not preclude SARS-Cov-2 infection and should not be used as the sole basis for treatment or other patient management decisions. A negative result may occur with  improper specimen collection/handling, submission of specimen other than nasopharyngeal swab, presence of viral mutation(s) within the areas targeted by this assay, and inadequate number of viral copies(<138 copies/mL). A negative result must be combined with clinical observations, patient history, and epidemiological information. The expected  result is Negative.  Fact Sheet for Patients:  EntrepreneurPulse.com.au  Fact Sheet for Healthcare Providers:  IncredibleEmployment.be  This test is no t yet approved or cleared by the [place] FDA and  has been authorized for detection and/or diagnosis of SARS-CoV-2 by FDA under an Emergency Use Authorization (EUA). This EUA will remain  in effect (meaning this test can be used) for the duration of the COVID-19 declaration under Section 564(b)(1) of the Act, 21 U.S.C.section 360bbb-3(b)(1), unless the authorization is terminated  or revoked sooner.       Influenza A by PCR NEGATIVE NEGATIVE Final   Influenza B by PCR NEGATIVE NEGATIVE Final    Comment: (NOTE) The Xpert Xpress SARS-CoV-2/FLU/RSV plus assay is intended as an aid in the diagnosis of influenza from Nasopharyngeal swab specimens and should not be used as a sole basis for treatment. Nasal washings and aspirates are unacceptable for Xpert Xpress SARS-CoV-2/FLU/RSV testing.  Fact Sheet for Patients: EntrepreneurPulse.com.au  Fact Sheet for Healthcare Providers: IncredibleEmployment.be  This test is not yet approved or cleared by the [place] FDA and has been authorized for detection and/or diagnosis of SARS-CoV-2 by FDA under an Emergency Use Authorization (EUA). This EUA will remain in effect (meaning this test can be used) for the duration of the COVID-19 declaration under Section 564(b)(1) of the Act, 21 U.S.C. section 360bbb-3(b)(1), unless the authorization is terminated or revoked.     Resp Syncytial Virus by PCR NEGATIVE NEGATIVE Final    Comment: (NOTE) Fact Sheet for Patients: EntrepreneurPulse.com.au  Fact Sheet for Healthcare Providers: IncredibleEmployment.be  This test is not yet approved or cleared by the [place] FDA and has been authorized for detection and/or diagnosis of  SARS-CoV-2 by FDA under an Emergency Use Authorization (EUA). This EUA will remain in effect (meaning this test can be used) for the duration of the COVID-19 declaration under Section 564(b)(1) of the Act,  21 U.S.C. section 360bbb-3(b)(1), unless the authorization is terminated or revoked.  Performed at [facility], [address] [place], [place] [address]   MRSA Next Gen by PCR, Nasal     Status: None   Collection Time: 08/30/22  1:12 AM   Specimen: Nasal Mucosa; Nasal Swab  Result Value Ref Range Status   MRSA by PCR Next Gen NOT DETECTED NOT DETECTED Final    Comment: (NOTE) The GeneXpert MRSA Assay (FDA approved for NASAL specimens only), is one component of a comprehensive MRSA colonization surveillance program. It is not intended to diagnose MRSA infection nor to guide or monitor treatment for MRSA infections. Test performance is not FDA approved in patients less than 4 years old. Performed at [facility], [place] [address], [place], [place] [address]      Radiological Exams on Admission: CT Renal Stone Study  Result Date: 08/29/2022 CLINICAL DATA:  Abdominal/flank pain, stone suspected Weakness. Lethargy. EXAM: CT ABDOMEN AND PELVIS WITHOUT CONTRAST TECHNIQUE: Multidetector CT imaging of the abdomen and pelvis was performed following the standard protocol without IV contrast. RADIATION DOSE REDUCTION: This exam was performed according to the departmental dose-optimization program which includes automated exposure control, adjustment of the mA and/or kV according to patient size and/or use of iterative reconstruction technique. COMPARISON:  None Available. FINDINGS: Lower chest: Mild basilar atelectasis with trace pleural thickening. No significant pleural effusion. Hepatobiliary: Mild motion artifact limitations. Allowing for this, no focal hepatic abnormality. Gallbladder physiologically distended, no calcified stone. No biliary dilatation. Pancreas: No ductal  dilatation or inflammation. Spleen: Normal in size without focal abnormality. Adrenals/Urinary Tract: Normal adrenal glands. No hydronephrosis. No renal calculi. No evidence of focal renal abnormality. Slight lobulated renal contours. Decompressed ureters without stones along the course. Partially distended urinary bladder with air-fluid level. No definite bladder wall thickening. No bladder stone. Stomach/Bowel: Unremarkable unenhanced appearance of the stomach. There is no small bowel obstruction or inflammatory change. Normal appendix. Submucosal fatty infiltration of the right colon suggestive of prior or chronic inflammation. No acute colonic inflammatory change. Small volume of colonic stool. Sigmoid colonic redundancy. Vascular/Lymphatic: Moderate aortic atherosclerosis. Aortic tortuosity without aneurysm. No enlarged lymph nodes in the abdomen or pelvis by size criteria. 9 mm lymph nodes in the right and left external iliac chain. Reproductive: Prostate is unremarkable by CT. Other: Mild presacral soft tissue thickening. No ascites. No free air. Musculoskeletal: Scoliosis with diffuse degenerative change in the lumbar spine. Moderate hip degenerative change. There are no acute or suspicious osseous abnormalities. IMPRESSION: 1. No renal stones or obstructive uropathy. 2. Air-fluid level in the urinary bladder, may be due to recent instrumentation or urinary tract infection. Recommend correlation with urinalysis. 3. Submucosal fatty infiltration of the right colon suggestive of prior or chronic inflammation. No acute bowel inflammation. 4. Nonspecific presacral soft tissue thickening Aortic Atherosclerosis (ICD10-I70.0). Electronically Signed   By: Keith Rake M.D.   On: 08/29/2022 18:27   CT Head Wo Contrast  Result Date: 08/29/2022 CLINICAL DATA:  Altered mental status EXAM: CT HEAD WITHOUT CONTRAST TECHNIQUE: Contiguous axial images were obtained from the base of the skull through the vertex  without intravenous contrast. RADIATION DOSE REDUCTION: This exam was performed according to the departmental dose-optimization program which includes automated exposure control, adjustment of the mA and/or kV according to patient size and/or use of iterative reconstruction technique. COMPARISON:  09/16/2020 FINDINGS: Brain: No evidence of acute infarction, hemorrhage, hydrocephalus, extra-axial collection or mass lesion/mass effect. Chronic atrophic  and ischemic changes are noted similar to that seen on prior exam. Prior infarcts are noted of the thalami bilaterally as well as in the right posterior parietooccipital lobe and cerebellar hemispheres. Vascular: No hyperdense vessel or unexpected calcification. Skull: Normal. Negative for fracture or focal lesion. Sinuses/Orbits: No acute finding. Other: None. IMPRESSION: Chronic atrophic and ischemic changes.  No acute abnormality noted. Electronically Signed   By: Inez Catalina M.D.   On: 08/29/2022 17:43   DG Chest Port 1 View  Result Date: 08/29/2022 CLINICAL DATA:  Altered mental status EXAM: PORTABLE CHEST 1 VIEW COMPARISON:  09/19/2020 FINDINGS: Cardiac shadow is within normal limits. Aortic calcifications are noted. Lungs are well aerated bilaterally. Mild left basilar atelectasis is seen. No bony abnormality is noted. IMPRESSION: Mild left basilar atelectasis. Electronically Signed   By: Inez Catalina M.D.   On: 08/29/2022 17:41    EKG: Independently reviewed. Sinus rhythm, PAC, LAD.   Assessment/Plan   1. AKI superimposed on CKD 3b  - SCr is 2.90 on admission, up from 1.63 in September 2023  - No obstructive uropathy on CT in ED  - Hold Lasix, continue IVF hydration, renally-dose medications, repeat chem panel in am    2. Acute encephalopathy  - No acute findings on head CT  - Likely d/t UTI and/or metabolic derangements with underlying cerebrovascular disease and dementia  - Treat UTI, AKI, and electrolyte derangements, use delirium  precautions   3. UTI  - Not septic on admission  - Started on Rocephin in ED  - Culture urine, continue Rocephin for now    4. Hypokalemia; hypomagnesemia  - Replace potassium and magnesium, repeat chem panel in am   5. Hypertension  - Continue Norvasc    6. Hx of seizures  - Continue Keppra    7. Dementia  - Delirium precautions   8. Hx of CVA  - Continue ASA and Lipitor    DVT prophylaxis: sq heparin  Code Status: Full  Level of Care: Level of care: Telemetry Medical Family Communication: Daughter updated from ED   Disposition Plan:  Patient is from: Home  Anticipated d/c is to: TBD Anticipated d/c date is: 09/02/22  Patient currently: Pending improved/stable renal function and mental status  Consults called: none  Admission status: Inpatient     Vianne Bulls, MD [facility]  08/30/2022, 3:53 AM

## 2022-08-30 NOTE — Progress Notes (Signed)
Pt has arrived to 2C10 from [facility] via [facility].  Pt alert and oriented x2, oriented to room.  [facility] paged and made aware of pt arrival to floor.

## 2022-08-30 NOTE — Progress Notes (Addendum)
Pharmacy Antibiotic Note  Andres White is a 77 y.o. male admitted on 08/29/2022 with sepsis.  Pharmacy has been consulted for vancomycin dosing.  Patient presented with lethargy and weakness. Initially afebrile. Required 4L O2 on presentation. WBC up from 8.7 to 21.9. Peripheral vasopressors started with fluid bolus for hypotension. Scr 2.84, expect further decline with hypotension.  Plan: Load with vancomycin 1500 mg once Variable dosing per levels for unstable renal function Vancomycin random 1/28 at 1000 Ceftriaxone per MD  Height: '5\' 7"'$  (170.2 cm) Weight: 79.5 kg (175 lb 4.3 oz) IBW/kg (Calculated) : 66.1  Temp (24hrs), Avg:98.2 F (36.8 C), Min:97.6 F (36.4 C), Max:98.5 F (36.9 C)  Recent Labs  Lab 08/26/22 1407 08/29/22 1728 08/29/22 1937 08/30/22 0638  WBC 6.7 8.7  --  21.9*  CREATININE 2.70* 2.90*  --  2.84*  LATICACIDVEN  --  2.6* 1.7  --     Estimated Creatinine Clearance: 21.3 mL/min (A) (by C-G formula based on SCr of 2.84 mg/dL (H)).    No Known Allergies  Antimicrobials this admission: Vancomycin 1/27 >>  CTX 1/26 >>   Microbiology results: 1/26 BCx: NGTD 1/27 MRSA PCR: neg  Thank you for involving pharmacy in this patient's care.  Reatha Harps, PharmD PGY2 Pharmacy Resident 08/30/2022 8:43 AM

## 2022-08-31 DIAGNOSIS — G9341 Metabolic encephalopathy: Secondary | ICD-10-CM | POA: Diagnosis not present

## 2022-08-31 LAB — CBC
HCT: 27 % — ABNORMAL LOW (ref 39.0–52.0)
Hemoglobin: 8 g/dL — ABNORMAL LOW (ref 13.0–17.0)
MCH: 25.5 pg — ABNORMAL LOW (ref 26.0–34.0)
MCHC: 29.6 g/dL — ABNORMAL LOW (ref 30.0–36.0)
MCV: 86 fL (ref 80.0–100.0)
Platelets: 171 10*3/uL (ref 150–400)
RBC: 3.14 MIL/uL — ABNORMAL LOW (ref 4.22–5.81)
RDW: 18.4 % — ABNORMAL HIGH (ref 11.5–15.5)
WBC: 16.4 10*3/uL — ABNORMAL HIGH (ref 4.0–10.5)
nRBC: 0 % (ref 0.0–0.2)

## 2022-08-31 LAB — RENAL FUNCTION PANEL
Albumin: 2.3 g/dL — ABNORMAL LOW (ref 3.5–5.0)
Anion gap: 10 (ref 5–15)
BUN: 34 mg/dL — ABNORMAL HIGH (ref 8–23)
CO2: 18 mmol/L — ABNORMAL LOW (ref 22–32)
Calcium: 8.7 mg/dL — ABNORMAL LOW (ref 8.9–10.3)
Chloride: 111 mmol/L (ref 98–111)
Creatinine, Ser: 1.94 mg/dL — ABNORMAL HIGH (ref 0.61–1.24)
GFR, Estimated: 35 mL/min — ABNORMAL LOW (ref >60)
Glucose, Bld: 74 mg/dL (ref 70–99)
Phosphorus: 2.6 mg/dL (ref 2.5–4.6)
Potassium: 3.9 mmol/L (ref 3.5–5.1)
Sodium: 139 mmol/L (ref 135–145)

## 2022-08-31 LAB — TROPONIN I (HIGH SENSITIVITY): Troponin I (High Sensitivity): 185 ng/L (ref <18)

## 2022-08-31 LAB — GLUCOSE, CAPILLARY
Glucose-Capillary: 106 mg/dL — ABNORMAL HIGH (ref 70–99)
Glucose-Capillary: 173 mg/dL — ABNORMAL HIGH (ref 70–99)
Glucose-Capillary: 70 mg/dL (ref 70–99)
Glucose-Capillary: 75 mg/dL (ref 70–99)
Glucose-Capillary: 84 mg/dL (ref 70–99)
Glucose-Capillary: 90 mg/dL (ref 70–99)

## 2022-08-31 LAB — MAGNESIUM: Magnesium: 2.1 mg/dL (ref 1.7–2.4)

## 2022-08-31 MED ORDER — LEVETIRACETAM 500 MG PO TABS
500.0000 mg | ORAL_TABLET | Freq: Two times a day (BID) | ORAL | Status: DC
  Administered 2022-08-31 – 2022-09-04 (×8): 500 mg via ORAL
  Filled 2022-08-31: qty 1
  Filled 2022-08-31 (×2): qty 2
  Filled 2022-08-31 (×5): qty 1

## 2022-08-31 MED ORDER — LACTATED RINGERS IV SOLN: INTRAVENOUS | Status: DC

## 2022-08-31 MED ORDER — POTASSIUM CHLORIDE 10 MEQ/100ML IV SOLN
10.0000 meq | INTRAVENOUS | Status: AC
  Administered 2022-08-31 (×3): 10 meq via INTRAVENOUS
  Filled 2022-08-31 (×3): qty 100

## 2022-08-31 MED ORDER — RINGERS IV SOLN
INTRAVENOUS | Status: DC
  Filled 2022-08-31: qty 1000

## 2022-08-31 NOTE — Progress Notes (Addendum)
[facility] Progress Note Patient Name: Andres White DOB: 04-30-1946 MRN: [id number]   Date of Service  08/31/2022  HPI/Events of Note  K+ 3.3 from 7:52pm      brady 40   111/55  AKI, Cr improving to 2.23. Mag 2.1   eICU Interventions  IV Kcl ordered.  EKG stat ordered, call back when it is done     Intervention Category Intermediate Interventions: Electrolyte abnormality - evaluation and management  [name] 08/31/2022, 12:11 AM  00:42 EKG reviewed. Sinus brady at 46, qtc 511, non specific T down in lead 3, same as before but antero-laterally.( New compared to earlier EKG).  - get troponin, if elevated , trend it. - hemodynamically stable . Getting potassium replacement per RN discussion,

## 2022-08-31 NOTE — Progress Notes (Signed)
Pharmacy Antibiotic Note  Andres White is a 77 y.o. male admitted on 08/29/2022 with sepsis.  Pharmacy has been consulted for vancomycin dosing.  Patient presented with lethargy and weakness. Initially afebrile. Required 4L O2 on presentation. WBC up from 8.7 to 21.9. Peripheral vasopressors started with fluid bolus for hypotension. Scr slowly trending down now that hypotension resolved.  Vancomycin load given yesterday. Vancomycin random ordered for this morning, however lab has still not been drawn. Given AUC dosing interval suggest every 48 hours, will delay vancomycin random to 1/29 AM. Will follow renal function to schedule dosing  Plan: Previously loaded with vancomycin 1500 mg once Variable dosing per levels for unstable renal function Vancomycin random 1/28 at 1000 Ceftriaxone per MD  Height: '5\' 7"'$  (170.2 cm) Weight: 81.2 kg (179 lb 0.2 oz) IBW/kg (Calculated) : 66.1  Temp (24hrs), Avg:97.5 F (36.4 C), Min:96.9 F (36.1 C), Max:97.9 F (36.6 C)  Recent Labs  Lab 08/26/22 1407 08/29/22 1728 08/29/22 1937 08/30/22 0638 08/30/22 0848 08/30/22 1313 08/30/22 1952 08/31/22 0849  WBC 6.7 8.7  --  21.9*  --   --   --  16.4*  CREATININE 2.70* 2.90*  --  2.84*  --  2.38* 2.23* 1.94*  LATICACIDVEN  --  2.6* 1.7  --  1.4 1.2  --   --      Estimated Creatinine Clearance: 31.5 mL/min (A) (by C-G formula based on SCr of 1.94 mg/dL (H)).    No Known Allergies  Antimicrobials this admission: Vancomycin 1/27 >>  CTX 1/26 >>   Microbiology results: 1/26 BCx: NGTD 1/27 MRSA PCR: neg  Thank you for involving pharmacy in this patient's care.  Reatha Harps, PharmD PGY2 Pharmacy Resident 08/31/2022 1:42 PM

## 2022-08-31 NOTE — Progress Notes (Signed)
NAME:  Andres White, MRN:  [id number], DOB:  [birth year], LOS: 1 ADMISSION DATE:  08/29/2022, CONSULTATION DATE:  08/30/22 REFERRING MD:  Dr. Waldron Labs, CHIEF COMPLAINT:  septic shock   History of Present Illness:  HPI obtained from EMR review and bedside staff as patient is encephalopathic and unable to provide.   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  ER workup found pt afebrile and normotensive, oriented x2, found with urosepsis with encephalopathy and AKI.  Admitted to [facility] but became progressive hypotensive and altered requiring RRT for SBP in the 50's.  Given additional 2L boluses and started on peripheral NE with improving mental status.  PCCM consulted for further ICU management.   Pertinent  Medical History   Past Medical History:  Diagnosis Date   Arthritis    Chronic back pain    Duodenal adenoma    Gout    Hypertension    Stroke ([place]) 06/2020   Syncope and collapse    Significant Hospital Events: Including procedures, antibiotic start and stop dates in addition to other pertinent events   1/27 admitted urosepsis, aki> hypotensive> NE/ ICU tx   Interim History / Subjective:  Weaned off vasopressors yesterday afternoon.  O2 down to 1 lpm. Awake, thirsty and hungry.  Objective   Blood pressure 124/69, pulse (!) 42, temperature 97.6 F (36.4 C), temperature source Oral, resp. rate 12, height '5\' 7"'$  (1.702 m), weight 81.2 kg, SpO2 100 %.        Intake/Output Summary (Last 24 hours) at 08/31/2022 0904 Last data filed at 08/31/2022 0600 Gross per 24 hour  Intake 2301.26 ml  Output 725 ml  Net 1576.26 ml    Filed Weights   08/29/22 1628 08/30/22 0109 08/31/22 0600  Weight: 76.9 kg 79.5 kg 81.2 kg    Examination: General:  AoC ill appearing elderly male lying in bed in NAD HEENT: MM pink/dry, pupils 4/reactive, anicteric Neuro: Oriented and  follows simple commands in all extremities, R sided weaker CV: rr, NSR, no obvious murmur, +1 distal pulses  PULM:  non labored, 99% on 4L [place], clear bilaterally .  GI: soft, bs+, NT/ ND Extremities: warm/dry, no LE edema  Skin: no rashes, dry skin  Ancillary tests personally reviewed:   Creatinine slowly improving to 2.23 Hypokalemia 3.3 Lactate has cleared 1.2 Leukocytosis at 21.9 No evidence of urinary obstruction by CT.  Urine culture pending.  EKG is unremarkable  Assessment & Plan:   Critically ill due to Septic shock 2/2 UTI, requiring titration of vasopressors and fluids. 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 No signs of HF at this time.  - Allow to eat.  - Continue current antibiotics and complete 7 days. Taper as possible based on cultures.  - Progressive ambulation.  - Continue to hold antihypertensive medications for now.  - Bradycardiac appears to be chronic - can transfer if asymptomatic with mobilization.   Best Practice (right click and "Reselect all SmartList Selections" daily)   Diet/type: regular diet.  DVT prophylaxis: prophylactic heparin  GI prophylaxis: PPI - home medication .  Lines: N/A Foley:  N/A Code Status:  full code Last date of multidisciplinary goals of care discussion [1/27]  Kipp Brood, MD [facility] ICU Physician [place]  Pager: [phone] Or Epic Secure Chat After hours: [phone].  08/31/2022, 9:35 AM

## 2022-09-01 ENCOUNTER — Telehealth: Payer: Self-pay

## 2022-09-01 DIAGNOSIS — Z8673 Personal history of transient ischemic attack (TIA), and cerebral infarction without residual deficits: Secondary | ICD-10-CM | POA: Diagnosis not present

## 2022-09-01 DIAGNOSIS — G9341 Metabolic encephalopathy: Secondary | ICD-10-CM | POA: Diagnosis not present

## 2022-09-01 DIAGNOSIS — N170 Acute kidney failure with tubular necrosis: Secondary | ICD-10-CM | POA: Diagnosis not present

## 2022-09-01 DIAGNOSIS — Z8669 Personal history of other diseases of the nervous system and sense organs: Secondary | ICD-10-CM | POA: Diagnosis not present

## 2022-09-01 LAB — CBC
HCT: 25.4 % — ABNORMAL LOW (ref 39.0–52.0)
Hemoglobin: 7.7 g/dL — ABNORMAL LOW (ref 13.0–17.0)
MCH: 25.8 pg — ABNORMAL LOW (ref 26.0–34.0)
MCHC: 30.3 g/dL (ref 30.0–36.0)
MCV: 85.2 fL (ref 80.0–100.0)
Platelets: 174 10*3/uL (ref 150–400)
RBC: 2.98 MIL/uL — ABNORMAL LOW (ref 4.22–5.81)
RDW: 18.4 % — ABNORMAL HIGH (ref 11.5–15.5)
WBC: 10.7 10*3/uL — ABNORMAL HIGH (ref 4.0–10.5)
nRBC: 0 % (ref 0.0–0.2)

## 2022-09-01 LAB — RENAL FUNCTION PANEL
Albumin: 2.3 g/dL — ABNORMAL LOW (ref 3.5–5.0)
Anion gap: 10 (ref 5–15)
BUN: 21 mg/dL (ref 8–23)
CO2: 20 mmol/L — ABNORMAL LOW (ref 22–32)
Calcium: 8.6 mg/dL — ABNORMAL LOW (ref 8.9–10.3)
Chloride: 104 mmol/L (ref 98–111)
Creatinine, Ser: 1.46 mg/dL — ABNORMAL HIGH (ref 0.61–1.24)
GFR, Estimated: 49 mL/min — ABNORMAL LOW (ref >60)
Glucose, Bld: 99 mg/dL (ref 70–99)
Phosphorus: 1.8 mg/dL — ABNORMAL LOW (ref 2.5–4.6)
Potassium: 2.7 mmol/L — CL (ref 3.5–5.1)
Sodium: 134 mmol/L — ABNORMAL LOW (ref 135–145)

## 2022-09-01 LAB — GLUCOSE, CAPILLARY
Glucose-Capillary: 89 mg/dL (ref 70–99)
Glucose-Capillary: 92 mg/dL (ref 70–99)
Glucose-Capillary: 92 mg/dL (ref 70–99)
Glucose-Capillary: 93 mg/dL (ref 70–99)

## 2022-09-01 LAB — MAGNESIUM: Magnesium: 1.7 mg/dL (ref 1.7–2.4)

## 2022-09-01 MED ORDER — POTASSIUM CHLORIDE CRYS ER 20 MEQ PO TBCR
40.0000 meq | EXTENDED_RELEASE_TABLET | Freq: Once | ORAL | Status: AC
  Administered 2022-09-01: 40 meq via ORAL

## 2022-09-01 MED ORDER — MAGNESIUM SULFATE 2 GM/50ML IV SOLN
2.0000 g | Freq: Once | INTRAVENOUS | Status: AC
  Administered 2022-09-01: 2 g via INTRAVENOUS
  Filled 2022-09-01: qty 50

## 2022-09-01 MED ORDER — K PHOS MONO-SOD PHOS DI & MONO 155-852-130 MG PO TABS
500.0000 mg | ORAL_TABLET | Freq: Two times a day (BID) | ORAL | Status: AC
  Administered 2022-09-01 (×2): 500 mg via ORAL
  Filled 2022-09-01 (×2): qty 2

## 2022-09-01 MED ORDER — POTASSIUM CHLORIDE CRYS ER 20 MEQ PO TBCR
40.0000 meq | EXTENDED_RELEASE_TABLET | Freq: Once | ORAL | Status: AC
  Administered 2022-09-01: 40 meq via ORAL
  Filled 2022-09-01: qty 2

## 2022-09-01 NOTE — Progress Notes (Signed)
TR[facility] night cross cover note:   I was notified by RN of the patient's potassium level of 2.7 this morning.  I subsequently ordered potassium chloride 40 meq p.o. x 1 dose to occur at 0745 this morning and also added on a serum magnesium level.     Babs Bertin, DO Hospitalist

## 2022-09-01 NOTE — Telephone Encounter (Signed)
-----  Message from Garvin Fila, MD sent at 08/28/2022  5:24 PM EST ----- Kindly inform the patient that urinalysis is suggestive of urinary tract infection and blood chemistries suggest worsening kidney function.  Needs to contact his primary care physician for further advice for treatment

## 2022-09-01 NOTE — Evaluation (Signed)
Occupational Therapy Evaluation Patient Details Name: Andres White MRN: [id number] DOB: Mar 30, 1946 Today's Date: 09/01/2022   History of Present Illness 77 yo male admitted 1/26 with progressive lethargy due to urosepsis with encephalopathy. Pt hypotensive requiring vasopressors. PMHx: CVA with Rt weakness, dementia, Sz, HFpEF, CKD, back pain, gout, HTN   Clinical Impression   Andres White was evaluated s/p the above admission, he was pleasantly confused, oriented to self only and unable to confirm PLOF or home set up. Upon evaluation he was limited by impaired cognition, weakness, poor activity tolerance, unsteady balance and safety awareness. Overall he needed mod A for transfers and mod-max A for ADLs with maximal cues for initiation, sequencing and safety. OT to follow acutely. Recommend d/c to SNF unless family can provide current level assist.     Recommendations for follow up therapy are one component of a multi-disciplinary discharge planning process, led by the attending physician.  Recommendations may be updated based on patient status, additional functional criteria and insurance authorization.   Follow Up Recommendations  Skilled nursing-short term rehab (<3 hours/day) Unless family is able to provide current level of physical and cognitive assist.     Assistance Recommended at Discharge Frequent or constant Supervision/Assistance  Patient can return home with the following A lot of help with walking and/or transfers;A lot of help with bathing/dressing/bathroom;Assistance with cooking/housework;Direct supervision/assist for medications management;Direct supervision/assist for financial management;Assist for transportation;Help with stairs or ramp for entrance    Functional Status Assessment  Patient has had a recent decline in their functional status and demonstrates the ability to make significant improvements in function in a reasonable and predictable amount of time.  Equipment  Recommendations  Other (comment) (defer)       Precautions / Restrictions Precautions Precautions: Fall Restrictions Weight Bearing Restrictions: No      Mobility Bed Mobility Overal bed mobility: Needs Assistance Bed Mobility: Sit to Supine       Sit to supine: Mod assist        Transfers Overall transfer level: Needs assistance Equipment used: Rolling walker (2 wheels) Transfers: Sit to/from Stand, Bed to chair/wheelchair/BSC Sit to Stand: Mod assist     Step pivot transfers: Mod assist     General transfer comment: stood 3x      Balance Overall balance assessment: Needs assistance Sitting-balance support: Bilateral upper extremity supported, Feet supported Sitting balance-Leahy Scale: Fair     Standing balance support: Bilateral upper extremity supported, Reliant on assistive device for balance Standing balance-Leahy Scale: Poor Standing balance comment: min assist, RW and bil UE support for standing                           ADL either performed or assessed with clinical judgement   ADL Overall ADL's : Needs assistance/impaired Eating/Feeding: Set up;Sitting   Grooming: Minimal assistance;Sitting   Upper Body Bathing: Moderate assistance;Sitting   Lower Body Bathing: Maximal assistance;+2 for safety/equipment;Sit to/from stand   Upper Body Dressing : Moderate assistance;Sitting   Lower Body Dressing: Maximal assistance;+2 for safety/equipment;Sit to/from stand   Toilet Transfer: Moderate assistance;Stand-pivot;Rolling walker (2 wheels)   Toileting- Clothing Manipulation and Hygiene: Maximal assistance;+2 for safety/equipment;Sit to/from stand       Functional mobility during ADLs: Moderate assistance;+2 for safety/equipment;Rolling walker (2 wheels) General ADL Comments: limited to SP transfers only. impiared cognition. weakness. poor activity tolerance.     Vision Patient Visual Report: No change from baseline Vision  Assessment?: Vision impaired- to  be further tested in functional context Additional Comments: Pt holding L eye closed throughout the session, when cued to open he denied DV. difficult to assess due to cog     Perception Perception Perception Tested?: No   Praxis Praxis Praxis tested?: Not tested    Pertinent Vitals/Pain Pain Assessment Pain Assessment: No/denies pain     Hand Dominance Right   Extremity/Trunk Assessment Upper Extremity Assessment Upper Extremity Assessment: Generalized weakness   Lower Extremity Assessment Lower Extremity Assessment: Generalized weakness   Cervical / Trunk Assessment Cervical / Trunk Assessment: Kyphotic   Communication Communication Communication: No difficulties   Cognition Arousal/Alertness: Awake/alert Behavior During Therapy: Flat affect Overall Cognitive Status: Impaired/Different from baseline Area of Impairment: Orientation, Memory, Following commands, Safety/judgement, Awareness                 Orientation Level: Disoriented to, Time, Situation, Place   Memory: Decreased short-term memory Following Commands: Follows one step commands inconsistently, Follows one step commands with increased time Safety/Judgement: Decreased awareness of safety, Decreased awareness of deficits Awareness: Intellectual   General Comments: only oriented to self. upon arrival pt pulled out IVs and removed tele wires. pleasantly confused and followed most simple commands, very limited insight, problem solving or awareness     General Comments  VSS throughout, IV RN present at the end of the session            Home Living Family/patient expects to be discharged to:: Private residence Living Arrangements: Children Available Help at Discharge: Family Type of Home: House Home Access: Stairs to enter Technical brewer of Steps: 1 Entrance Stairs-Rails: None Home Layout: One level     Bathroom Shower/Tub: Animal nutritionist: Standard Bathroom Accessibility: Yes   Home Equipment: Conservation officer, nature (2 wheels);Wheelchair - manual;BSC/3in1          Prior Functioning/Environment Prior Level of Function : Needs assist       Physical Assist : Mobility (physical);ADLs (physical) Mobility (physical): Transfers ADLs (physical): Feeding;Bathing;Dressing Mobility Comments: pt needs assist to stand grossly 25% of the time at home and has varied mobility between [facility] dependent and walking 200' to mailbox with RW ADLs Comments: family assists with bathing and dressing and performs all IADLS, self feeds        OT Problem List: Decreased strength;Decreased range of motion;Decreased activity tolerance;Decreased cognition;Decreased safety awareness;Decreased coordination;Decreased knowledge of use of DME or AE;Decreased knowledge of precautions      OT Treatment/Interventions: Self-care/ADL training;Therapeutic exercise;DME and/or AE instruction;Therapeutic activities;Patient/family education;Balance training;Cognitive remediation/compensation    OT Goals(Current goals can be found in the care plan section) Acute Rehab OT Goals Patient Stated Goal: unable to state OT Goal Formulation: With patient Time For Goal Achievement: 09/15/22 Potential to Achieve Goals: Good ADL Goals Pt Will Perform Grooming: with set-up;sitting Pt Will Perform Lower Body Dressing: with mod assist;sit to/from stand Pt Will Transfer to Toilet: with min assist;ambulating;bedside commode Additional ADL Goal #1: pt will independently sequence through basic ADL task  OT Frequency: Min 2X/week       AM-PAC OT "6 Clicks" Daily Activity     Outcome Measure Help from another person eating meals?: A Little Help from another person taking care of personal grooming?: A Little Help from another person toileting, which includes using toliet, bedpan, or urinal?: A Lot Help from another person bathing (including washing, rinsing,  drying)?: A Lot Help from another person to put on and taking off regular upper body clothing?: A Lot Help  from another person to put on and taking off regular lower body clothing?: A Lot 6 Click Score: 14   End of Session Equipment Utilized During Treatment: Rolling walker (2 wheels) Nurse Communication: Mobility status  Activity Tolerance: Patient tolerated treatment well Patient left: in bed;with call bell/phone within reach;Other (comment) (with IV RN)  OT Visit Diagnosis: Unsteadiness on feet (R26.81);Other abnormalities of gait and mobility (R26.89);Muscle weakness (generalized) (M62.81)                Time: 7953-6922 OT Time Calculation (min): 15 min Charges:  OT General Charges $OT Visit: 1 Visit OT Evaluation $OT Eval Moderate Complexity: 1 Mod   Timiyah Romito D Causey 09/01/2022, 5:02 PM

## 2022-09-01 NOTE — Evaluation (Signed)
Physical Therapy Evaluation Patient Details Name: Andres White MRN: [id number] DOB: 1946/06/22 Today's Date: 09/01/2022  History of Present Illness  77 yo male admitted 1/26 with progressive lethargy due to urosepsis with encephalopathy. Pt hypotensive requiring vasopressors. PMHx: CVA with Rt weakness, dementia, Sz, HFpEF, CKD, back pain, gout, HTN  Clinical Impression  Pt pleasant with confusion and difficult to obtain clear PLOF from pt. Talked to daughter [name] on the phone who confirmed pt has 24hr assist, needed DME, and has varied function pending the day. Pt can walk 200' with RW to mailbox at times and others relies on [facility] for mobility during the day and family reports RLE pain tends to limit function. Pt without report of pain but limited by decreased strength, transfers and mobility. Encouraged OOB to chair with nursing and stedy may be helpful. Will follow acutely to maximize strength, safety and mobility to decrease burden of care.   HR 65-122 SpO2 100% on RA       Recommendations for follow up therapy are one component of a multi-disciplinary discharge planning process, led by the attending physician.  Recommendations may be updated based on patient status, additional functional criteria and insurance authorization.  Follow Up Recommendations Home health PT      Assistance Recommended at Discharge Frequent or constant Supervision/Assistance  Patient can return home with the following  A lot of help with walking and/or transfers;A lot of help with bathing/dressing/bathroom;Assistance with cooking/housework;Direct supervision/assist for medications management;Assist for transportation;Help with stairs or ramp for entrance    Equipment Recommendations None recommended by PT  Recommendations for Other Services       Functional Status Assessment Patient has had a recent decline in their functional status and/or demonstrates limited ability to make significant improvements in  function in a reasonable and predictable amount of time     Precautions / Restrictions Precautions Precautions: Fall      Mobility  Bed Mobility Overal bed mobility: Needs Assistance Bed Mobility: Supine to Sit     Supine to sit: Min assist, HOB elevated     General bed mobility comments: Min assist with increased time and HOB 30 degrees, min cues and assist to scoot hips to EOB    Transfers Overall transfer level: Needs assistance   Transfers: Sit to/from Stand, Bed to chair/wheelchair/BSC Sit to Stand: Min assist Stand pivot transfers: Mod assist         General transfer comment: min assist to stand from bed and recliner with use of RW and mod assist to pivot bed to recliner, fatigues quickly with assist to guide hips to surface    Ambulation/Gait               General Gait Details: unable  Stairs            Wheelchair Mobility    Modified Rankin (Stroke Patients Only)       Balance Overall balance assessment: Needs assistance Sitting-balance support: Bilateral upper extremity supported, Feet supported Sitting balance-Leahy Scale: Fair     Standing balance support: Bilateral upper extremity supported, Reliant on assistive device for balance Standing balance-Leahy Scale: Poor Standing balance comment: min assist, RW and bil UE support for standing                             Pertinent Vitals/Pain Pain Assessment Pain Assessment: No/denies pain    Home Living Family/patient expects to be discharged to:: Private residence Living Arrangements:  Children Available Help at Discharge: Family Type of Home: House Home Access: Stairs to enter   [employer] of Steps: 1   Home Layout: One level Home Equipment: Conservation officer, nature (2 wheels);Wheelchair - manual;BSC/3in1      Prior Function Prior Level of Function : Needs assist       Physical Assist : Mobility (physical);ADLs (physical) Mobility (physical):  Transfers ADLs (physical): Feeding;Bathing;Dressing Mobility Comments: pt needs assist to stand grossly 25% of the time at home and has varied mobility between [facility] dependent and walking 200' to mailbox with RW ADLs Comments: family assists with bathing and dressing and performs all IADLS, self feeds     Hand Dominance        Extremity/Trunk Assessment   Upper Extremity Assessment Upper Extremity Assessment: Generalized weakness    Lower Extremity Assessment Lower Extremity Assessment: Generalized weakness    Cervical / Trunk Assessment Cervical / Trunk Assessment: Kyphotic  Communication   Communication: No difficulties  Cognition Arousal/Alertness: Awake/alert Behavior During Therapy: Flat affect Overall Cognitive Status: Impaired/Different from baseline Area of Impairment: Orientation, Memory, Following commands, Safety/judgement, Awareness                 Orientation Level: Disoriented to, Time, Situation, Place   Memory: Decreased short-term memory Following Commands: Follows one step commands inconsistently, Follows one step commands with increased time Safety/Judgement: Decreased awareness of safety, Decreased awareness of deficits     General Comments: pt with varied reports of PLOF and confirmed with daughter over the phone that he truly does vary between walking and using WC. Unable to recall day, place or situation        General Comments      Exercises     Assessment/Plan    PT Assessment Patient needs continued PT services  PT Problem List Decreased strength;Decreased mobility;Decreased safety awareness;Decreased activity tolerance;Decreased balance;Decreased knowledge of use of DME;Decreased cognition;Cardiopulmonary status limiting activity       PT Treatment Interventions Gait training;Therapeutic exercise;Patient/family education;DME instruction;Therapeutic activities;Stair training;Functional mobility training;Balance training    PT Goals  (Current goals can be found in the Care Plan section)  Acute Rehab PT Goals Patient Stated Goal: return home, be outside PT Goal Formulation: With patient/family Time For Goal Achievement: 09/15/22 Potential to Achieve Goals: Fair    Frequency Min 3X/week     Co-evaluation               AM-PAC PT "6 Clicks" Mobility  Outcome Measure Help needed turning from your back to your side while in a flat bed without using bedrails?: A Little Help needed moving from lying on your back to sitting on the side of a flat bed without using bedrails?: A Lot Help needed moving to and from a bed to a chair (including a wheelchair)?: A Little Help needed standing up from a chair using your arms (e.g., wheelchair or bedside chair)?: A Little Help needed to walk in hospital room?: A Lot Help needed climbing 3-5 steps with a railing? : Total 6 Click Score: 14    End of Session Equipment Utilized During Treatment: Gait belt Activity Tolerance: Patient tolerated treatment well Patient left: in chair;with call bell/phone within reach;with chair alarm set Nurse Communication: Mobility status PT Visit Diagnosis: Other abnormalities of gait and mobility (R26.89);Muscle weakness (generalized) (M62.81)    Time: 3536-1443 PT Time Calculation (min) (ACUTE ONLY): 21 min   Charges:   PT Evaluation $PT Eval Moderate Complexity: 1 Mod  [place] Office: [place] 09/01/2022, 1:38 PM

## 2022-09-01 NOTE — Progress Notes (Signed)
PROGRESS NOTE    Andres White  ZOX:[id number] DOB: 1945-08-23 DOA: 08/29/2022 PCP: Marrian Salvage, FNP   Brief Narrative:  Patient is a 77 year old overweight elderly African-American male who lives at home with his daughter past medical history significant for but limited to history of brainstem CVA with residual right-sided weakness Family is with a walker at baseline, underlying dementia, seizure disorder, heart failure with preserved ejection fraction, hypertension, CKD stage IIIb, chronic back pain presenting from home with lethargy and weakness for last 3 weeks after prior URI.  He went to the ED in further workup found patient be afebrile and normotensive that he had some sepsis secondary to urinary tract infection with encephalopathy and AKI.  He was admitted to the [facility] service but progressively became more more hypotensive and altered requiring RRT for SBP's in the 25s.  He was given additional 2 boluses and started on peripheral norepinephrine with improving mental status.  He was transferred out of the ICU by PCCM to [facility] service on 09/01/2022.  Patient continues to remain confused due to dementia but appears more awake and alert.  PT OT recommending SNF  Assessment and Plan: No notes have been filed under this hospital service. Service: Hospitalist  Septic shock secondary to urinary tract infection -Patient was weaned off of pressors and fluids discontinued today given concern for volume overload and history of CHF -Continues to be on IV Ceftriaxone and PCCM recommends completing 7 days and taper as possible based on blood culture results -WBC Trend: Recent Labs  Lab 08/26/22 1407 08/29/22 1728 08/30/22 0638 08/31/22 0849 09/01/22 0426  WBC 6.7 8.7 21.9* 16.4* 10.7*  -Diet was advanced yesterday -PT OT are now recommending SNF  Acute Septic Encephalopathy superimposed on Chronic Dementia -Delirium Precautions  Hx of Seizures Hx of Brainstem CVA with Residual Right  Sided Hemiparesis -C/w Levetiracetam 500 mg po BID as well as ASSA 81 mg po Daily, Atorvastain 40 mg po Daily  BPH -C/w Tamsulosin 0.4 mg po Daily   Chronic Normocytic Anemia -Hgb/Hct Trend: Recent Labs  Lab 08/26/22 1407 08/29/22 1728 08/29/22 1728 08/29/22 1734 08/30/22 0638 08/31/22 0849 09/01/22 0426  HGB 9.7* 10.1*  --  11.6* 8.5* 8.0* 7.7*  HCT 31.4* 32.6*   < > 34.0* 25.7* 27.0* 25.4*  MCV 82 81.9   < >  --  80.6 86.0 85.2   < > = values in this interval not displayed.  -Check Anemia Panel in the AM -Continue to Monitor for S/Sx of Bleeding; No overt bleeding noted -Repeat CBC in the AM   Hypoalbuminemia -Albumin Level Trend: Recent Labs  Lab 08/26/22 1407 08/29/22 1728 08/30/22 0638 08/31/22 0849 09/01/22 0426  ALBUMIN 3.4* 3.2* 2.4* 2.3* 2.3*  -Continue to Monitor and Replete as Necessary -Repeat CMP in the AM   AKI on Stage 3b CKD Metabolic ACidosis -BUN/Cr Trend: Recent Labs  Lab 08/26/22 1407 08/29/22 1728 08/30/22 0638 08/30/22 1313 08/30/22 1952 08/31/22 0849 09/01/22 0426  BUN 57* 45* 41* 37* 36* 34* 21  CREATININE 2.70* 2.90* 2.84* 2.38* 2.23* 1.94* 1.46*  -IVF Hydration to stop today -Avoid Nephrotoxic Medications, Contrast Dyes, Hypotension and Dehydration to Ensure Adequate Renal Perfusion and will need to Renally Adjust Meds -Patient has a CO2 of 20, AG of 10, Chloride 104 -Continue to Monitor and Trend Renal Function carefully and repeat CMP in the AM   Hypokalemia -K+ Trend: Recent Labs  Lab 08/29/22 1728 08/29/22 1734 08/30/22 8119 08/30/22 1313 08/30/22 1952 08/31/22 0849 09/01/22  0426  K 2.3* 2.3* 2.4* 3.5 3.3* 3.9 2.7*  -Replete with po Kcl 40 mEQ BID x2 and po K Phos 500 mg po BID -Continue to Monitor and Replete as Necessary -Repeat CMP in the AM   Hypophosphatemia -Phos Level Trend Recent Labs  Lab 08/31/22 0849 09/01/22 0426  PHOS 2.6 1.8*  -Replete with po K Phos 500 mg po BID -Repeat Phos Level in the  AM  DVT prophylaxis: heparin injection 5,000 Units Start: 08/30/22 1400    Code Status: Full Code Family Communication: No family present at bedside   Disposition Plan:  Level of care: Med-Surg Status is: Inpatient Remains inpatient appropriate because: SNF given PT OT evaluation and further improvement   Consultants:  PCCM transfer  Procedures:  As delineated as above  Antimicrobials:  Anti-infectives (From admission, onward)    Start     Dose/Rate Route Frequency Ordered Stop   08/30/22 1800  cefTRIAXone (ROCEPHIN) 1 g in sodium chloride 0.9 % 100 mL IVPB  Status:  Discontinued        1 g 200 mL/hr over 30 Minutes Intravenous Every 24 hours 08/30/22 0154 08/30/22 0815   08/30/22 0945  vancomycin (VANCOREADY) IVPB 1500 mg/300 mL        1,500 mg 150 mL/hr over 120 Minutes Intravenous  Once 08/30/22 0847 08/30/22 1321   08/30/22 0846  vancomycin variable dose per unstable renal function (pharmacist dosing)  Status:  Discontinued         Does not apply See admin instructions 08/30/22 0847 08/31/22 2113   08/30/22 0830  cefTRIAXone (ROCEPHIN) 2 g in sodium chloride 0.9 % 100 mL IVPB        2 g 200 mL/hr over 30 Minutes Intravenous Every 24 hours 08/30/22 0815 09/06/22 0914   08/29/22 1845  cefTRIAXone (ROCEPHIN) 1 g in sodium chloride 0.9 % 100 mL IVPB        1 g 200 mL/hr over 30 Minutes Intravenous  Once 08/29/22 1833 08/29/22 2012       Subjective: Seen and examined at bedside and he continues to have some confusion and pleasantly demented but per nurse he is improved.  He denies any nausea or vomiting and denies any pain.  No other concerns or complaints at this time.  Objective: Vitals:   09/01/22 1325 09/01/22 1400 09/01/22 1500 09/01/22 1600  BP: (!) 171/93 (!) 155/82 123/79 (!) 142/86  Pulse: 65 67 66 73  Resp: (!) '22 19 15 '$ (!) 21  Temp:      TempSrc:      SpO2: 100% (!) 63% 99% 96%  Weight:      Height:        Intake/Output Summary (Last 24 hours) at  09/01/2022 1833 Last data filed at 09/01/2022 1400 Gross per 24 hour  Intake 2925.99 ml  Output 1550 ml  Net 1375.99 ml   Filed Weights   08/29/22 1628 08/30/22 0109 08/31/22 0600  Weight: 76.9 kg 79.5 kg 81.2 kg   Examination: Physical Exam:  Constitutional: WN/WD overweight elderly chronically ill-appearing African-American male in no acute distress Respiratory: Diminished to auscultation bilaterally, no wheezing, rales, rhonchi or crackles. Normal respiratory effort and patient is not tachypenic. No accessory muscle use.  Unlabored breathing Cardiovascular: RRR, no murmurs / rubs / gallops. S1 and S2 auscultated.  Trace extremity edema Abdomen: Soft, non-tender, distended secondary to body habitus. Bowel sounds positive.  GU: Deferred. Musculoskeletal: No clubbing / cyanosis of digits/nails. No joint deformity upper and lower extremities.  Skin: No rashes, lesions, ulcers, on limited skin evaluation.  No induration; Warm and dry.  Neurologic: CN 2-12 grossly intact with no focal deficits. Romberg sign and cerebellar reflexes not assessed.  Psychiatric: Normal judgment and insight. Alert and oriented x 1. Normal mood and appropriate affect.   Data Reviewed: I have personally reviewed following labs and imaging studies  CBC: Recent Labs  Lab 08/26/22 1407 08/29/22 1728 08/29/22 1734 08/30/22 0638 08/31/22 0849 09/01/22 0426  WBC 6.7 8.7  --  21.9* 16.4* 10.7*  NEUTROABS  --  6.4  --   --   --   --   HGB 9.7* 10.1* 11.6* 8.5* 8.0* 7.7*  HCT 31.4* 32.6* 34.0* 25.7* 27.0* 25.4*  MCV 82 81.9  --  80.6 86.0 85.2  PLT 326 306  --  206 171 716   Basic Metabolic Panel: Recent Labs  Lab 08/30/22 0638 08/30/22 1313 08/30/22 1445 08/30/22 1952 08/31/22 0849 09/01/22 0426  NA 141 139  --  138 139 134*  K 2.4* 3.5  --  3.3* 3.9 2.7*  CL 106 109  --  107 111 104  CO2 24 17*  --  22 18* 20*  GLUCOSE 104* 113*  --  92 74 99  BUN 41* 37*  --  36* 34* 21  CREATININE 2.84* 2.38*   --  2.23* 1.94* 1.46*  CALCIUM 8.6* 8.0*  --  8.1* 8.7* 8.6*  MG 1.7  --  1.7 2.1 2.1 1.7  PHOS  --   --   --   --  2.6 1.8*   GFR: Estimated Creatinine Clearance: 41.8 mL/min (A) (by C-G formula based on SCr of 1.46 mg/dL (H)). Liver Function Tests: Recent Labs  Lab 08/26/22 1407 08/29/22 1728 08/30/22 0638 08/31/22 0849 09/01/22 0426  AST 81* 54* 38  --   --   ALT 33 28 20  --   --   ALKPHOS 105 94 76  --   --   BILITOT 0.5 0.8 0.6  --   --   PROT 7.8 8.8* 6.6  --   --   ALBUMIN 3.4* 3.2* 2.4* 2.3* 2.3*   No results for input(s): "LIPASE", "AMYLASE" in the last 168 hours. No results for input(s): "AMMONIA" in the last 168 hours. Coagulation Profile: No results for input(s): "INR", "PROTIME" in the last 168 hours. Cardiac Enzymes: No results for input(s): "CKTOTAL", "CKMB", "CKMBINDEX", "TROPONINI" in the last 168 hours. BNP (last 3 results) No results for input(s): "PROBNP" in the last 8760 hours. HbA1C: No results for input(s): "HGBA1C" in the last 72 hours. CBG: Recent Labs  Lab 08/31/22 1928 09/01/22 0020 09/01/22 0315 09/01/22 0759 09/01/22 1235  GLUCAP 90 93 92 89 92   Lipid Profile: No results for input(s): "CHOL", "HDL", "LDLCALC", "TRIG", "CHOLHDL", "LDLDIRECT" in the last 72 hours. Thyroid Function Tests: No results for input(s): "TSH", "T4TOTAL", "FREET4", "T3FREE", "THYROIDAB" in the last 72 hours. Anemia Panel: No results for input(s): "VITAMINB12", "FOLATE", "FERRITIN", "TIBC", "IRON", "RETICCTPCT" in the last 72 hours. Sepsis Labs: Recent Labs  Lab 08/29/22 1728 08/29/22 1937 08/30/22 0848 08/30/22 1313  LATICACIDVEN 2.6* 1.7 1.4 1.2    Recent Results (from the past 240 hour(s))  Microscopic Examination     Status: Abnormal   Collection Time: 08/26/22  2:07 PM  Result Value Ref Range Status   WBC, UA >30 (A) 0 - 5 /hpf Final   RBC, Urine 0-2 0 - 2 /hpf Final   Epithelial Cells (non renal)  None seen 0 - 10 /hpf Final   Casts None seen  None seen /lpf Final   Bacteria, UA Many (A) None seen/Few Final  Blood culture (routine x 2)     Status: None (Preliminary result)   Collection Time: 08/29/22  5:14 PM   Specimen: BLOOD RIGHT FOREARM  Result Value Ref Range Status   Specimen Description   Final    BLOOD RIGHT FOREARM Performed at [facility], [address] [place], [place] [address]    Special Requests   Final    BOTTLES DRAWN AEROBIC AND ANAEROBIC Blood Culture results may not be optimal due to an inadequate volume of blood received in culture bottles Performed at [facility], [address] [place], [place] [address]    Culture   Final    NO GROWTH 3 DAYS Performed at [facility], [place] [address] [place], [place] [address]    Report Status PENDING  Incomplete  Blood culture (routine x 2)     Status: None (Preliminary result)   Collection Time: 08/29/22  5:23 PM   Specimen: Left Antecubital; Blood  Result Value Ref Range Status   Specimen Description   Final    LEFT ANTECUBITAL BLOOD Performed at [facility], [place] [address], [place], [place] [address]    Special Requests   Final    BOTTLES DRAWN AEROBIC AND ANAEROBIC Blood Culture adequate volume Performed at [facility], [address] [place], [place] [address]    Culture   Final    NO GROWTH 3 DAYS Performed at [facility], [place] [address], [place], [place] [address]    Report Status PENDING  Incomplete  Resp panel by RT-PCR (RSV, Flu A&B, Covid) Anterior Nasal Swab     Status: None   Collection Time: 08/29/22  5:28 PM   Specimen: Anterior Nasal Swab  Result Value Ref Range Status   SARS Coronavirus 2 by RT PCR NEGATIVE NEGATIVE Final    Comment: (NOTE) SARS-CoV-2 target nucleic acids are NOT DETECTED.  The SARS-CoV-2 RNA is generally detectable in upper respiratory specimens during the acute phase of infection. The lowest concentration of SARS-CoV-2 viral copies this assay can detect  is 138 copies/mL. A negative result does not preclude SARS-Cov-2 infection and should not be used as the sole basis for treatment or other patient management decisions. A negative result may occur with  improper specimen collection/handling, submission of specimen other than nasopharyngeal swab, presence of viral mutation(s) within the areas targeted by this assay, and inadequate number of viral copies(<138 copies/mL). A negative result must be combined with clinical observations, patient history, and epidemiological information. The expected result is Negative.  Fact Sheet for Patients:  EntrepreneurPulse.com.au  Fact Sheet for Healthcare Providers:  IncredibleEmployment.be  This test is no t yet approved or cleared by the [place] FDA and  has been authorized for detection and/or diagnosis of SARS-CoV-2 by FDA under an Emergency Use Authorization (EUA). This EUA will remain  in effect (meaning this test can be used) for the duration of the COVID-19 declaration under Section 564(b)(1) of the Act, 21 U.S.C.section 360bbb-3(b)(1), unless the authorization is terminated  or revoked sooner.       Influenza A by PCR NEGATIVE NEGATIVE Final   Influenza B by PCR NEGATIVE NEGATIVE Final    Comment: (NOTE) The Xpert Xpress SARS-CoV-2/FLU/RSV plus assay is intended as an aid in the diagnosis of influenza  from Nasopharyngeal swab specimens and should not be used as a sole basis for treatment. Nasal washings and aspirates are unacceptable for Xpert Xpress SARS-CoV-2/FLU/RSV testing.  Fact Sheet for Patients: EntrepreneurPulse.com.au  Fact Sheet for Healthcare Providers: IncredibleEmployment.be  This test is not yet approved or cleared by the [place] FDA and has been authorized for detection and/or diagnosis of SARS-CoV-2 by FDA under an Emergency Use Authorization (EUA). This EUA will remain in effect  (meaning this test can be used) for the duration of the COVID-19 declaration under Section 564(b)(1) of the Act, 21 U.S.C. section 360bbb-3(b)(1), unless the authorization is terminated or revoked.     Resp Syncytial Virus by PCR NEGATIVE NEGATIVE Final    Comment: (NOTE) Fact Sheet for Patients: EntrepreneurPulse.com.au  Fact Sheet for Healthcare Providers: IncredibleEmployment.be  This test is not yet approved or cleared by the [place] FDA and has been authorized for detection and/or diagnosis of SARS-CoV-2 by FDA under an Emergency Use Authorization (EUA). This EUA will remain in effect (meaning this test can be used) for the duration of the COVID-19 declaration under Section 564(b)(1) of the Act, 21 U.S.C. section 360bbb-3(b)(1), unless the authorization is terminated or revoked.  Performed at [facility], [address] [place], [place] [address]   MRSA Next Gen by PCR, Nasal     Status: None   Collection Time: 08/30/22  1:12 AM   Specimen: Nasal Mucosa; Nasal Swab  Result Value Ref Range Status   MRSA by PCR Next Gen NOT DETECTED NOT DETECTED Final    Comment: (NOTE) The GeneXpert MRSA Assay (FDA approved for NASAL specimens only), is one component of a comprehensive MRSA colonization surveillance program. It is not intended to diagnose MRSA infection nor to guide or monitor treatment for MRSA infections. Test performance is not FDA approved in patients less than 75 years old. Performed at [facility], [place] [address] [place], [place] [address]     Radiology Studies: No results found.  Scheduled Meds:  aspirin EC  81 mg Oral Daily   atorvastatin  40 mg Oral Daily   Chlorhexidine Gluconate Cloth  6 each Topical Daily   cilostazol  50 mg Oral BID   heparin  5,000 Units Subcutaneous Q8H   levETIRAcetam  500 mg Oral BID   mouth rinse  15 mL Mouth Rinse 4 times per day   phosphorus  500 mg Oral BID    sodium chloride flush  3 mL Intravenous Q12H   tamsulosin  0.4 mg Oral QPC supper   Continuous Infusions:  sodium chloride 10 mL/hr at 08/29/22 1844   sodium chloride 10 mL/hr at 09/01/22 1400   cefTRIAXone (ROCEPHIN)  IV Stopped (09/01/22 0846)    LOS: 2 days   Raiford Noble, DO [facility] Available via Epic secure chat 7am-7pm After these hours, please refer to coverage provider listed on [email or web address] 09/01/2022, 6:33 PM

## 2022-09-02 ENCOUNTER — Ambulatory Visit (HOSPITAL_COMMUNITY): Payer: Medicare Other

## 2022-09-02 DIAGNOSIS — Z8669 Personal history of other diseases of the nervous system and sense organs: Secondary | ICD-10-CM | POA: Diagnosis not present

## 2022-09-02 DIAGNOSIS — Z8673 Personal history of transient ischemic attack (TIA), and cerebral infarction without residual deficits: Secondary | ICD-10-CM | POA: Diagnosis not present

## 2022-09-02 DIAGNOSIS — N170 Acute kidney failure with tubular necrosis: Secondary | ICD-10-CM | POA: Diagnosis not present

## 2022-09-02 DIAGNOSIS — G9341 Metabolic encephalopathy: Secondary | ICD-10-CM | POA: Diagnosis not present

## 2022-09-02 LAB — GLUCOSE, CAPILLARY
Glucose-Capillary: 107 mg/dL — ABNORMAL HIGH (ref 70–99)
Glucose-Capillary: 80 mg/dL (ref 70–99)
Glucose-Capillary: 92 mg/dL (ref 70–99)
Glucose-Capillary: 94 mg/dL (ref 70–99)
Glucose-Capillary: 97 mg/dL (ref 70–99)

## 2022-09-02 LAB — CBC WITH DIFFERENTIAL/PLATELET
Abs Immature Granulocytes: 0.02 10*3/uL (ref 0.00–0.07)
Basophils Absolute: 0 10*3/uL (ref 0.0–0.1)
Basophils Relative: 0 %
Eosinophils Absolute: 0 10*3/uL (ref 0.0–0.5)
Eosinophils Relative: 0 %
HCT: 22.7 % — ABNORMAL LOW (ref 39.0–52.0)
Hemoglobin: 7.3 g/dL — ABNORMAL LOW (ref 13.0–17.0)
Immature Granulocytes: 0 %
Lymphocytes Relative: 39 %
Lymphs Abs: 2.6 10*3/uL (ref 0.7–4.0)
MCH: 26 pg (ref 26.0–34.0)
MCHC: 32.2 g/dL (ref 30.0–36.0)
MCV: 80.8 fL (ref 80.0–100.0)
Monocytes Absolute: 0.4 10*3/uL (ref 0.1–1.0)
Monocytes Relative: 6 %
Neutro Abs: 3.7 10*3/uL (ref 1.7–7.7)
Neutrophils Relative %: 55 %
Platelets: 159 10*3/uL (ref 150–400)
RBC: 2.81 MIL/uL — ABNORMAL LOW (ref 4.22–5.81)
RDW: 18.1 % — ABNORMAL HIGH (ref 11.5–15.5)
WBC: 6.8 10*3/uL (ref 4.0–10.5)
nRBC: 0 % (ref 0.0–0.2)

## 2022-09-02 LAB — COMPREHENSIVE METABOLIC PANEL
ALT: 14 U/L (ref 0–44)
AST: 31 U/L (ref 15–41)
Albumin: 2.2 g/dL — ABNORMAL LOW (ref 3.5–5.0)
Alkaline Phosphatase: 68 U/L (ref 38–126)
Anion gap: 8 (ref 5–15)
BUN: 11 mg/dL (ref 8–23)
CO2: 22 mmol/L (ref 22–32)
Calcium: 8.4 mg/dL — ABNORMAL LOW (ref 8.9–10.3)
Chloride: 106 mmol/L (ref 98–111)
Creatinine, Ser: 1.17 mg/dL (ref 0.61–1.24)
GFR, Estimated: >60 mL/min (ref >60)
Glucose, Bld: 92 mg/dL (ref 70–99)
Potassium: 3 mmol/L — ABNORMAL LOW (ref 3.5–5.1)
Sodium: 136 mmol/L (ref 135–145)
Total Bilirubin: 0.6 mg/dL (ref 0.3–1.2)
Total Protein: 6 g/dL — ABNORMAL LOW (ref 6.5–8.1)

## 2022-09-02 LAB — PHOSPHORUS: Phosphorus: 2.8 mg/dL (ref 2.5–4.6)

## 2022-09-02 LAB — MAGNESIUM: Magnesium: 1.7 mg/dL (ref 1.7–2.4)

## 2022-09-02 MED ORDER — AMLODIPINE BESYLATE 10 MG PO TABS
10.0000 mg | ORAL_TABLET | Freq: Every day | ORAL | Status: DC
  Administered 2022-09-02 – 2022-09-04 (×3): 10 mg via ORAL
  Filled 2022-09-02 (×3): qty 1

## 2022-09-02 MED ORDER — MAGNESIUM SULFATE 2 GM/50ML IV SOLN
2.0000 g | Freq: Once | INTRAVENOUS | Status: AC
  Administered 2022-09-02: 2 g via INTRAVENOUS
  Filled 2022-09-02: qty 50

## 2022-09-02 MED ORDER — POTASSIUM CHLORIDE CRYS ER 20 MEQ PO TBCR
40.0000 meq | EXTENDED_RELEASE_TABLET | ORAL | Status: AC
  Administered 2022-09-02 (×2): 40 meq via ORAL
  Filled 2022-09-02: qty 2

## 2022-09-02 NOTE — Care Management Important Message (Signed)
Important Message  Patient Details  Name: Andres White MRN: [id number] Date of Birth: Jan 30, 1946   Medicare Important Message Given:  Yes     [name] 09/02/2022, 2:50 PM

## 2022-09-02 NOTE — Progress Notes (Signed)
PROGRESS NOTE    Andres White  LOV:[id number] DOB: 05/23/1946 DOA: 08/29/2022 PCP: Marrian Salvage, FNP   Brief Narrative:  Patient is a 77 year old overweight elderly African-American male who lives at home with his daughter past medical history significant for but limited to history of brainstem CVA with residual right-sided weakness Family is with a walker at baseline, underlying dementia, seizure disorder, heart failure with preserved ejection fraction, hypertension, CKD stage IIIb, chronic back pain presenting from home with lethargy and weakness for last 3 weeks after prior URI.  He went to the ED in further workup found patient be afebrile and normotensive that he had some sepsis secondary to urinary tract infection with encephalopathy and AKI.  He was admitted to the [facility] service but progressively became more more hypotensive and altered requiring RRT for SBP's in the 28s.  He was given additional 2 boluses and started on peripheral norepinephrine with improving mental status.  He was transferred out of the ICU by PCCM to [facility] service on 09/01/2022.  Patient continues to remain confused due to dementia but appears more awake and alert.  PT OT recommending SNF but patient's family wants to take him home with home health so when he is medically stable likely in the next 24 to 48 hours.  Assessment and Plan:  Septic shock secondary to urinary tract infection -Patient was weaned off of pressors and fluids discontinued today given concern for volume overload and history of CHF -Urinalysis done and showed a cloudy appearance with trace hemoglobin, large leukocytes, negative nitrites, many bacteria, 0-5 RBCs per high-power field, 11-20 WBCs however unfortunately no urine culture has been sent and he is on antibiotics so sending one now is essentially useless -Continues to be on IV Ceftriaxone and PCCM recommends completing 7 days and taper as possible based on blood culture results; currently  blood cultures showed no growth to date at 4 days -WBC Trend: Recent Labs  Lab 08/26/22 1407 08/29/22 1728 08/30/22 0638 08/31/22 0849 09/01/22 0426 09/02/22 0719  WBC 6.7 8.7 21.9* 16.4* 10.7* 6.8  -Diet was advanced yesterday -PT OT are now recommending SNF but family is declining and wanting to take him home with home health and anticipating this happening in the next 24 to 48 hours given that he is improving   Acute Septic Encephalopathy superimposed on Chronic Dementia -Delirium Precautions   Hx of Seizures Hx of Brainstem CVA with Residual Right Sided Hemiparesis -C/w Levetiracetam 500 mg po BID as well as ASA 81 mg po Daily, Atorvastain 40 mg po Daily   BPH -C/w Tamsulosin 0.4 mg po Daily    Chronic Normocytic Anemia -Hgb/Hct Trend: Recent Labs  Lab 08/26/22 1407 08/29/22 1728 08/29/22 1728 08/29/22 1734 08/30/22 0638 08/31/22 0849 09/01/22 0426 09/02/22 0719  HGB 9.7* 10.1*  --  11.6* 8.5* 8.0* 7.7* 7.3*  HCT 31.4* 32.6*   < > 34.0* 25.7* 27.0* 25.4* 22.7*  MCV 82 81.9   < >  --  80.6 86.0 85.2 80.8   < > = values in this interval not displayed.  -Check Anemia Panel in the AM -Continue to Monitor for S/Sx of Bleeding; No overt bleeding noted -Repeat CBC in the AM    Hypoalbuminemia -Patient's Albumin Trend: Recent Labs  Lab 08/26/22 1407 08/29/22 1728 08/30/22 8841 08/31/22 0849 09/01/22 0426 09/02/22 0719  ALBUMIN 3.4* 3.2* 2.4* 2.3* 2.3* 2.2*  -Continue to Monitor and Trend and repeat CMP in the AM  AKI on Stage 3b CKD Metabolic ACidosis -  BUN/Cr Trend: Recent Labs  Lab 08/29/22 1728 08/30/22 1950 08/30/22 1313 08/30/22 1952 08/31/22 0849 09/01/22 0426 09/02/22 0719  BUN 45* 41* 37* 36* 34* 21 11  CREATININE 2.90* 2.84* 2.38* 2.23* 1.94* 1.46* 1.17  -IVF Hydration to stop today -Avoid Nephrotoxic Medications, Contrast Dyes, Hypotension and Dehydration to Ensure Adequate Renal Perfusion and will need to Renally Adjust Meds -Patient has  a CO2 of 20, AG of 10, Chloride 104 -Continue to Monitor and Trend Renal Function carefully and repeat CMP in the AM    Hypokalemia -Patient's K+ Level Trend: Recent Labs  Lab 08/29/22 1734 08/30/22 0638 08/30/22 1313 08/30/22 1952 08/31/22 0849 09/01/22 0426 09/02/22 0719  K 2.3* 2.4* 3.5 3.3* 3.9 2.7* 3.0*  -Replete with po Kcl 40 mEQ BID x2 -Continue to Monitor and Replete as Necessary -Repeat CMP in the AM   Hypophosphatemia -Phos Level Trend Recent Labs  Lab 08/31/22 0849 09/01/22 0426 09/02/22 0719  PHOS 2.6 1.8* 2.8  -Replete with po K Phos 500 mg po BID -Repeat Phos Level in the AM  DVT prophylaxis: heparin injection 5,000 Units Start: 08/30/22 1400    Code Status: Full Code Family Communication: Discussed with the daughter [name] over the telephone  Disposition Plan:  Level of care: Med-Surg Status is: Inpatient Remains inpatient appropriate because: He is slowly improving and renal function WBC just normalized.  Will continue IV antibiotic treatments given that the cultures are unknown.  Anticipating discharge in the next 24-48 hours   Consultants:  PCCM transfer  Procedures:  As delineated as above  Antimicrobials:  Anti-infectives (From admission, onward)    Start     Dose/Rate Route Frequency Ordered Stop   08/30/22 1800  cefTRIAXone (ROCEPHIN) 1 g in sodium chloride 0.9 % 100 mL IVPB  Status:  Discontinued        1 g 200 mL/hr over 30 Minutes Intravenous Every 24 hours 08/30/22 0154 08/30/22 0815   08/30/22 0945  vancomycin (VANCOREADY) IVPB 1500 mg/300 mL        1,500 mg 150 mL/hr over 120 Minutes Intravenous  Once 08/30/22 0847 08/30/22 1321   08/30/22 0846  vancomycin variable dose per unstable renal function (pharmacist dosing)  Status:  Discontinued         Does not apply See admin instructions 08/30/22 0847 08/31/22 2113   08/30/22 0830  cefTRIAXone (ROCEPHIN) 2 g in sodium chloride 0.9 % 100 mL IVPB        2 g 200 mL/hr over 30 Minutes  Intravenous Every 24 hours 08/30/22 0815 09/06/22 0914   08/29/22 1845  cefTRIAXone (ROCEPHIN) 1 g in sodium chloride 0.9 % 100 mL IVPB        1 g 200 mL/hr over 30 Minutes Intravenous  Once 08/29/22 1833 08/29/22 2012       Subjective: Seen and examined at bedside and he had no complaints and states that he is feeling well.  PT OT recommended SNF but patient's family is declining and wants to take him home with home health services.  Denies any nausea or vomiting.  No other concerns or complaints at this time.  Objective: Vitals:   09/01/22 1956 09/02/22 0352 09/02/22 0745 09/02/22 1544  BP: (!) 154/74 (!) 175/79 (!) 176/73 (!) 144/75  Pulse: 64 (!) 54 (!) 48 (!) 56  Resp: 20 16    Temp: 97.9 F (36.6 C) 98.1 F (36.7 C) 97.7 F (36.5 C) 97.6 F (36.4 C)  TempSrc: Oral Oral Oral Oral  SpO2:  100% 100% 100% 100%  Weight:      Height:        Intake/Output Summary (Last 24 hours) at 09/02/2022 1711 Last data filed at 09/02/2022 1221 Gross per 24 hour  Intake 240 ml  Output 850 ml  Net -610 ml   Filed Weights   08/29/22 1628 08/30/22 0109 08/31/22 0600  Weight: 76.9 kg 79.5 kg 81.2 kg   Examination: Physical Exam:  Constitutional: WN/WD overweight elderly chronically ill-appearing African-American male currently no acute distress Respiratory: Diminished to auscultation bilaterally, no wheezing, rales, rhonchi or crackles. Normal respiratory effort and patient is not tachypenic. No accessory muscle use.  Unlabored breathing Cardiovascular: RRR, no murmurs / rubs / gallops. S1 and S2 auscultated.  Trace extremity edema and arms appear puffy Abdomen: Soft, non-tender, non-distended. Bowel sounds positive x4.  GU: Deferred. Musculoskeletal: No clubbing / cyanosis of digits/nails. No joint deformity upper and lower extremities.  Skin: No rashes, lesions, ulcers or skin evaluation. No induration; Warm and dry.  Neurologic: CN 2-12 grossly intact with no focal deficits. Romberg  sign cerebellar reflexes not assessed.  Psychiatric: Pleasantly confused but alert and oriented x 2. Normal mood and appropriate affect.   Data Reviewed: I have personally reviewed following labs and imaging studies  CBC: Recent Labs  Lab 08/29/22 1728 08/29/22 1734 08/30/22 0638 08/31/22 0849 09/01/22 0426 09/02/22 0719  WBC 8.7  --  21.9* 16.4* 10.7* 6.8  NEUTROABS 6.4  --   --   --   --  3.7  HGB 10.1* 11.6* 8.5* 8.0* 7.7* 7.3*  HCT 32.6* 34.0* 25.7* 27.0* 25.4* 22.7*  MCV 81.9  --  80.6 86.0 85.2 80.8  PLT 306  --  206 171 174 976   Basic Metabolic Panel: Recent Labs  Lab 08/30/22 1313 08/30/22 1445 08/30/22 1952 08/31/22 0849 09/01/22 0426 09/02/22 0719  NA 139  --  138 139 134* 136  K 3.5  --  3.3* 3.9 2.7* 3.0*  CL 109  --  107 111 104 106  CO2 17*  --  22 18* 20* 22  GLUCOSE 113*  --  92 74 99 92  BUN 37*  --  36* 34* 21 11  CREATININE 2.38*  --  2.23* 1.94* 1.46* 1.17  CALCIUM 8.0*  --  8.1* 8.7* 8.6* 8.4*  MG  --  1.7 2.1 2.1 1.7 1.7  PHOS  --   --   --  2.6 1.8* 2.8   GFR: Estimated Creatinine Clearance: 52.2 mL/min (by C-G formula based on SCr of 1.17 mg/dL). Liver Function Tests: Recent Labs  Lab 08/29/22 1728 08/30/22 7341 08/31/22 0849 09/01/22 0426 09/02/22 0719  AST 54* 38  --   --  31  ALT 28 20  --   --  14  ALKPHOS 94 76  --   --  68  BILITOT 0.8 0.6  --   --  0.6  PROT 8.8* 6.6  --   --  6.0*  ALBUMIN 3.2* 2.4* 2.3* 2.3* 2.2*   No results for input(s): "LIPASE", "AMYLASE" in the last 168 hours. No results for input(s): "AMMONIA" in the last 168 hours. Coagulation Profile: No results for input(s): "INR", "PROTIME" in the last 168 hours. Cardiac Enzymes: No results for input(s): "CKTOTAL", "CKMB", "CKMBINDEX", "TROPONINI" in the last 168 hours. BNP (last 3 results) No results for input(s): "PROBNP" in the last 8760 hours. HbA1C: No results for input(s): "HGBA1C" in the last 72 hours. CBG: Recent Labs  Lab 09/01/22  1235  09/02/22 0038 09/02/22 0828 09/02/22 1222 09/02/22 1607  GLUCAP 92 94 80 107* 92   Lipid Profile: No results for input(s): "CHOL", "HDL", "LDLCALC", "TRIG", "CHOLHDL", "LDLDIRECT" in the last 72 hours. Thyroid Function Tests: No results for input(s): "TSH", "T4TOTAL", "FREET4", "T3FREE", "THYROIDAB" in the last 72 hours. Anemia Panel: No results for input(s): "VITAMINB12", "FOLATE", "FERRITIN", "TIBC", "IRON", "RETICCTPCT" in the last 72 hours. Sepsis Labs: Recent Labs  Lab 08/29/22 1728 08/29/22 1937 08/30/22 0848 08/30/22 1313  LATICACIDVEN 2.6* 1.7 1.4 1.2    Recent Results (from the past 240 hour(s))  Microscopic Examination     Status: Abnormal   Collection Time: 08/26/22  2:07 PM  Result Value Ref Range Status   WBC, UA >30 (A) 0 - 5 /hpf Final   RBC, Urine 0-2 0 - 2 /hpf Final   Epithelial Cells (non renal) None seen 0 - 10 /hpf Final   Casts None seen None seen /lpf Final   Bacteria, UA Many (A) None seen/Few Final  Blood culture (routine x 2)     Status: None (Preliminary result)   Collection Time: 08/29/22  5:14 PM   Specimen: BLOOD RIGHT FOREARM  Result Value Ref Range Status   Specimen Description   Final    BLOOD RIGHT FOREARM Performed at [facility], [address] [place], [place] [address]    Special Requests   Final    BOTTLES DRAWN AEROBIC AND ANAEROBIC Blood Culture results may not be optimal due to an inadequate volume of blood received in culture bottles Performed at [facility], [address] [place], [place] [address]    Culture   Final    NO GROWTH 4 DAYS Performed at [facility], [place] [address] [place], [place] [address]    Report Status PENDING  Incomplete  Blood culture (routine x 2)     Status: None (Preliminary result)   Collection Time: 08/29/22  5:23 PM   Specimen: Left Antecubital; Blood  Result Value Ref Range Status   Specimen Description   Final    LEFT ANTECUBITAL BLOOD Performed at [facility], [place] [address], [place], [place] [address]    Special Requests   Final    BOTTLES DRAWN AEROBIC AND ANAEROBIC Blood Culture adequate volume Performed at [facility], [address] [place], [place] [address]    Culture   Final    NO GROWTH 4 DAYS Performed at [facility], [place] [address], [place], [place] [address]    Report Status PENDING  Incomplete  Resp panel by RT-PCR (RSV, Flu A&B, Covid) Anterior Nasal Swab     Status: None   Collection Time: 08/29/22  5:28 PM   Specimen: Anterior Nasal Swab  Result Value Ref Range Status   SARS Coronavirus 2 by RT PCR NEGATIVE NEGATIVE Final    Comment: (NOTE) SARS-CoV-2 target nucleic acids are NOT DETECTED.  The SARS-CoV-2 RNA is generally detectable in upper respiratory specimens during the acute phase of infection. The lowest concentration of SARS-CoV-2 viral copies this assay can detect is 138 copies/mL. A negative result does not preclude SARS-Cov-2 infection and should not be used as the sole basis for treatment or other patient management decisions. A negative result may occur with  improper specimen collection/handling, submission of specimen other than nasopharyngeal swab, presence of viral mutation(s) within the areas targeted by this assay, and inadequate number of viral copies(<138 copies/mL).  A negative result must be combined with clinical observations, patient history, and epidemiological information. The expected result is Negative.  Fact Sheet for Patients:  EntrepreneurPulse.com.au  Fact Sheet for Healthcare Providers:  IncredibleEmployment.be  This test is no t yet approved or cleared by the [place] FDA and  has been authorized for detection and/or diagnosis of SARS-CoV-2 by FDA under an Emergency Use Authorization (EUA). This EUA will remain  in effect (meaning this test can be used) for the duration of the COVID-19 declaration under  Section 564(b)(1) of the Act, 21 U.S.C.section 360bbb-3(b)(1), unless the authorization is terminated  or revoked sooner.       Influenza A by PCR NEGATIVE NEGATIVE Final   Influenza B by PCR NEGATIVE NEGATIVE Final    Comment: (NOTE) The Xpert Xpress SARS-CoV-2/FLU/RSV plus assay is intended as an aid in the diagnosis of influenza from Nasopharyngeal swab specimens and should not be used as a sole basis for treatment. Nasal washings and aspirates are unacceptable for Xpert Xpress SARS-CoV-2/FLU/RSV testing.  Fact Sheet for Patients: EntrepreneurPulse.com.au  Fact Sheet for Healthcare Providers: IncredibleEmployment.be  This test is not yet approved or cleared by the [place] FDA and has been authorized for detection and/or diagnosis of SARS-CoV-2 by FDA under an Emergency Use Authorization (EUA). This EUA will remain in effect (meaning this test can be used) for the duration of the COVID-19 declaration under Section 564(b)(1) of the Act, 21 U.S.C. section 360bbb-3(b)(1), unless the authorization is terminated or revoked.     Resp Syncytial Virus by PCR NEGATIVE NEGATIVE Final    Comment: (NOTE) Fact Sheet for Patients: EntrepreneurPulse.com.au  Fact Sheet for Healthcare Providers: IncredibleEmployment.be  This test is not yet approved or cleared by the [place] FDA and has been authorized for detection and/or diagnosis of SARS-CoV-2 by FDA under an Emergency Use Authorization (EUA). This EUA will remain in effect (meaning this test can be used) for the duration of the COVID-19 declaration under Section 564(b)(1) of the Act, 21 U.S.C. section 360bbb-3(b)(1), unless the authorization is terminated or revoked.  Performed at [facility], [address] [place], [place] [address]   MRSA Next Gen by PCR, Nasal     Status: None   Collection Time: 08/30/22  1:12 AM   Specimen:  Nasal Mucosa; Nasal Swab  Result Value Ref Range Status   MRSA by PCR Next Gen NOT DETECTED NOT DETECTED Final    Comment: (NOTE) The GeneXpert MRSA Assay (FDA approved for NASAL specimens only), is one component of a comprehensive MRSA colonization surveillance program. It is not intended to diagnose MRSA infection nor to guide or monitor treatment for MRSA infections. Test performance is not FDA approved in patients less than 36 years old. Performed at [facility], [place] [address], [place], [place] [address]     Radiology Studies: No results found.  Scheduled Meds:  amLODipine  10 mg Oral Daily   aspirin EC  81 mg Oral Daily   atorvastatin  40 mg Oral Daily   Chlorhexidine Gluconate Cloth  6 each Topical Daily   cilostazol  50 mg Oral BID   heparin  5,000 Units Subcutaneous Q8H   levETIRAcetam  500 mg Oral BID   mouth rinse  15 mL Mouth Rinse 4 times per day   sodium chloride flush  3 mL Intravenous Q12H   tamsulosin  0.4 mg Oral QPC supper   Continuous Infusions:  sodium chloride 10 mL/hr at 08/29/22 1844  sodium chloride 10 mL/hr at 09/01/22 1400   cefTRIAXone (ROCEPHIN)  IV 2 g (09/02/22 5872)    LOS: 3 days   Raiford Noble, DO [facility] Available via Epic secure chat 7am-7pm After these hours, please refer to coverage provider listed on [email or web address] 09/02/2022, 5:11 PM

## 2022-09-02 NOTE — TOC Initial Note (Signed)
Transition of Care [facility]) - Initial/Assessment Note    Patient Details  Name: Andres White MRN: [id number] Date of Birth: 1946/06/09  [facility] CM/SW Contact:    Erenest Rasher, RN Phone Number: [phone] 09/02/2022, 9:12 AM  Clinical Narrative:                  TOC CM spoke to pt's dtr, [name]. PT recommended SNF, but dtr declined SNF rehab. Offered choice for [facility]. Pt's dtr states he had [facility] in the past, but requesting another agency that will accept insurance. Contacted [facility] rep, Kelly with new referral. Dtr states pt has RW but it leans. She is requesting another RW for home. [place] for RW.  Will need HH PT/OT orders with [facility]. Attending updated.    Expected Discharge Plan: [place] Barriers to Discharge: Continued Medical Work up   Patient Goals and CMS Choice Patient states their goals for this hospitalization and ongoing recovery are:: wants patient to stay active CMS Medicare.gov Compare Post Acute Care list provided to:: Patient Represenative (must comment) Choice offered to / list presented to : Adult Children      Expected Discharge Plan and Services   Discharge Planning Services: CM Consult Post Acute Care Choice: Home Health Living arrangements for the past 2 months: Single Family Home                 DME Arranged: Walker rolling DME Agency: [employer]       HH Arranged: PT, OT HH Agency: [place] Date HH Agency Contacted: 09/02/22 Time [place]: 0911 Representative spoke with at [place]: Otila Kluver  Prior Living Arrangements/Services Living arrangements for the past 2 months: [place] Lives with:: Adult Children Patient language and need for interpreter reviewed:: Yes Do you feel safe going back to the place where you live?: Yes      Need for Family Participation in Patient Care: Yes (Comment) Care giver support system in place?:  Yes (comment) Current home services: DME (rolling walker, wheelchair, bedside commode, shower chair) Criminal Activity/Legal Involvement Pertinent to Current Situation/Hospitalization: No - Comment as needed  Activities of Daily Living      Permission Sought/Granted Permission sought to share information with : Case Manager, Family Supports, PCP    Share Information with NAME: [name]     Permission granted to share info w Relationship: daughter  Permission granted to share info w Contact Information: [phone]  Emotional Assessment       Orientation: : Oriented to Self   Psych Involvement: No (comment)  Admission diagnosis:  Hypokalemia [E87.6] Hypomagnesemia [E83.42] Acute renal failure (ARF) ([place]) [N17.9] AKI (acute kidney injury) ([place]) [N17.9] Urinary tract infection without hematuria, site unspecified [N39.0] Patient Active Problem List   Diagnosis Date Noted   Hypokalemia 84/69/6295   Acute metabolic encephalopathy 28/41/3244   UTI (urinary tract infection) 08/30/2022   Hypomagnesemia 08/30/2022   Acute renal failure (ARF) ([place]) 08/29/2022   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   Abnormal findings on esophagogastroduodenoscopy (EGD) 10/02/2020   Constipation 10/02/2020   Anemia 10/02/2020   History of seizure disorder 10/02/2020   History of stroke 10/02/2020   History of colon polyps 10/02/2020   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 10/02/2020   Endotracheally intubated 09/16/2020   Community acquired pneumonia 09/16/2020   Cervical spondylosis 09/16/2020   Vertebral artery stenosis 09/16/2020   Status epilepticus ([place]) 09/16/2020   CKD (chronic  kidney disease) stage 3, GFR 30-59 ml/min (HCC) 09/16/2020   Occult blood in stools    Leukocytosis    Hyponatremia    Hypertension    Dysphagia, post-stroke    Dysesthesia    Neuropathic pain    Brainstem infarct, acute (HCC)    Acute blood loss anemia    Acute renal failure superimposed on stage 3b  chronic kidney disease ([place])    Stroke (cerebrum) ([place]) 06/27/2020   Right leg weakness    Left carotid stenosis 04/13/2014   PCP:  Marrian Salvage, [place] Pharmacy:   [facility] [phone] [address], [address] AT [facility] [address] [place] [address] Phone: [phone] Fax: [phone]     Social Determinants of Health (SDOH) Social History: SDOH Screenings   Food Insecurity: No Food Insecurity (03/31/2022)  Housing: Low Risk  (12/11/2021)  Transportation Needs: No Transportation Needs (12/11/2021)  Alcohol Screen: Low Risk  (03/31/2022)  Depression (PHQ2-9): Low Risk  (04/15/2022)  Financial Resource Strain: Low Risk  (03/31/2022)  Physical Activity: Inactive (08/26/2022)  Social Connections: Socially Isolated (03/31/2022)  Stress: Stress Concern Present (08/26/2022)  Tobacco Use: High Risk (08/30/2022)   SDOH Interventions:     Readmission Risk Interventions     No data to display

## 2022-09-03 DIAGNOSIS — N170 Acute kidney failure with tubular necrosis: Secondary | ICD-10-CM | POA: Diagnosis not present

## 2022-09-03 DIAGNOSIS — N1832 Chronic kidney disease, stage 3b: Secondary | ICD-10-CM | POA: Diagnosis not present

## 2022-09-03 LAB — CBC WITH DIFFERENTIAL/PLATELET
Abs Immature Granulocytes: 0.03 10*3/uL (ref 0.00–0.07)
Basophils Absolute: 0 10*3/uL (ref 0.0–0.1)
Basophils Relative: 0 %
Eosinophils Absolute: 0 10*3/uL (ref 0.0–0.5)
Eosinophils Relative: 0 %
HCT: 24.2 % — ABNORMAL LOW (ref 39.0–52.0)
Hemoglobin: 7.6 g/dL — ABNORMAL LOW (ref 13.0–17.0)
Immature Granulocytes: 0 %
Lymphocytes Relative: 37 %
Lymphs Abs: 2.8 10*3/uL (ref 0.7–4.0)
MCH: 25.9 pg — ABNORMAL LOW (ref 26.0–34.0)
MCHC: 31.4 g/dL (ref 30.0–36.0)
MCV: 82.3 fL (ref 80.0–100.0)
Monocytes Absolute: 0.6 10*3/uL (ref 0.1–1.0)
Monocytes Relative: 8 %
Neutro Abs: 4.1 10*3/uL (ref 1.7–7.7)
Neutrophils Relative %: 55 %
Platelets: 177 10*3/uL (ref 150–400)
RBC: 2.94 MIL/uL — ABNORMAL LOW (ref 4.22–5.81)
RDW: 18.4 % — ABNORMAL HIGH (ref 11.5–15.5)
WBC: 7.6 10*3/uL (ref 4.0–10.5)
nRBC: 0 % (ref 0.0–0.2)

## 2022-09-03 LAB — BASIC METABOLIC PANEL
Anion gap: 6 (ref 5–15)
BUN: 8 mg/dL (ref 8–23)
CO2: 21 mmol/L — ABNORMAL LOW (ref 22–32)
Calcium: 8.3 mg/dL — ABNORMAL LOW (ref 8.9–10.3)
Chloride: 108 mmol/L (ref 98–111)
Creatinine, Ser: 1.03 mg/dL (ref 0.61–1.24)
GFR, Estimated: >60 mL/min (ref >60)
Glucose, Bld: 92 mg/dL (ref 70–99)
Potassium: 3.5 mmol/L (ref 3.5–5.1)
Sodium: 135 mmol/L (ref 135–145)

## 2022-09-03 LAB — GLUCOSE, CAPILLARY
Glucose-Capillary: 106 mg/dL — ABNORMAL HIGH (ref 70–99)
Glucose-Capillary: 106 mg/dL — ABNORMAL HIGH (ref 70–99)
Glucose-Capillary: 124 mg/dL — ABNORMAL HIGH (ref 70–99)
Glucose-Capillary: 82 mg/dL (ref 70–99)
Glucose-Capillary: 83 mg/dL (ref 70–99)
Glucose-Capillary: 90 mg/dL (ref 70–99)

## 2022-09-03 LAB — CULTURE, BLOOD (ROUTINE X 2)
Culture: NO GROWTH
Culture: NO GROWTH
Special Requests: ADEQUATE

## 2022-09-03 LAB — RETICULOCYTES
Immature Retic Fract: 25.9 % — ABNORMAL HIGH (ref 2.3–15.9)
RBC.: 2.98 MIL/uL — ABNORMAL LOW (ref 4.22–5.81)
Retic Count, Absolute: 59 10*3/uL (ref 19.0–186.0)
Retic Ct Pct: 2 % (ref 0.4–3.1)

## 2022-09-03 LAB — FERRITIN: Ferritin: 79 ng/mL (ref 24–336)

## 2022-09-03 LAB — MAGNESIUM: Magnesium: 1.8 mg/dL (ref 1.7–2.4)

## 2022-09-03 LAB — VITAMIN B12: Vitamin B-12: 1018 pg/mL — ABNORMAL HIGH (ref 180–914)

## 2022-09-03 LAB — IRON AND TIBC
Iron: 52 ug/dL (ref 45–182)
Saturation Ratios: 22 % (ref 17.9–39.5)
TIBC: 237 ug/dL — ABNORMAL LOW (ref 250–450)
UIBC: 185 ug/dL

## 2022-09-03 LAB — PHOSPHORUS: Phosphorus: 2.3 mg/dL — ABNORMAL LOW (ref 2.5–4.6)

## 2022-09-03 LAB — FOLATE: Folate: 3.7 ng/mL — ABNORMAL LOW (ref >5.9)

## 2022-09-03 MED ORDER — CEPHALEXIN 500 MG PO CAPS
500.0000 mg | ORAL_CAPSULE | Freq: Two times a day (BID) | ORAL | Status: DC
  Administered 2022-09-04: 500 mg via ORAL
  Filled 2022-09-03: qty 1

## 2022-09-03 NOTE — Progress Notes (Addendum)
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 at baseline HFpEF HTN CKD 3B Gout BPH  Brought to [facility] ED 1/26 weakness lethargy since upper respiratory illness-some concern for UTI AKI On admit potassium 2.3 BUNs/creatinine 45/2.9 lactic acid 2.6 admitted for AKI  1/27 progressive hypotension ental status change requiring RRT peripheral norepinephrine and fluid boluses and developed sepsis 1/29 transfer back to [facility]   Hospital-Problem based course  Sepsis present on admission, WBC 21 lactic acid 2.6 -Source likely UTI unfortunately no urine collected, BC x 2 from admit neg - Has received a total of 6 days therapy so we will discontinue ceftriaxone today and placed on 1 more day of Keflex twice daily  AKI on admission - Holding Lasix 20 daily and would not resume on discharge  -Will need outpatient follow-up to truly determine if needs this  Hypokalemia on admission - Potassium now improved, periodic labs  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 - Resume home health on discharge as patient family wishes for this instead of skilled - Continue Keppra 500 twice daily Continue aspirin 81 daily Lipitor 40 daily  Borderline microcytic anemia With probable dilutional component from sepsis resuscitation - Does not meet criteria for transfusion - is not acvtively bleeding  Gout - Holding allopurinol 100 daily  BPH - Continue Flomax 0.4 daily   DVT prophylaxis: Heparin Code Status: Presumed full Family Communication:  Disposition:  Status is: Inpatient Remains inpatient appropriate because:   Finish course of antibiotics     Subjective: Does not know where he is does not know the time or place, can tell me that he is in [place] when pressed but cannot localize to where he is Some echolalia  Objective: Vitals:   09/02/22 1544 09/02/22 2016 09/03/22 0420 09/03/22 0500  BP: (!) 144/75 124/80 (!) 164/81   Pulse: (!) 56 61 (!) 59   Resp:  18 16   Temp: 97.6 F (36.4 C) 98 F (36.7 C) 98.6 F (37 C)   TempSrc: Oral Oral Oral   SpO2: 100% 100% 98%   Weight:    83 kg  Height:        Intake/Output Summary (Last 24 hours) at 09/03/2022 0832 Last data filed at 09/02/2022 1221 Gross per 24 hour  Intake --  Output 850 ml  Net -850 ml   Filed Weights   08/30/22 0109 08/31/22 0600 09/03/22 0500  Weight: 79.5 kg 81.2 kg 83 kg    Examination:  Awake coherent no distress EOMI NCAT no focal deficit no icterus no pallor Chest is clear no added sound no wheeze rales rhonchi Abdomen soft no rebound Dusky appearing lower extremities warm S1-S2 no murmur Neuro intact moving 4 limbs equally  Data Reviewed: personally reviewed   CBC    Component Value Date/Time   WBC 6.8 09/02/2022 0719   RBC 2.81 (L) 09/02/2022 0719   HGB 7.3 (L) 09/02/2022 0719   HGB 9.7 (L) 08/26/2022 1407   HCT 22.7 (L) 09/02/2022 0719   HCT 31.4 (L) 08/26/2022 1407   PLT 159 09/02/2022 0719   PLT 326 08/26/2022 1407   MCV 80.8 09/02/2022 0719   MCV 82 08/26/2022 1407   MCV 95 03/23/2014 2009   MCH 26.0 09/02/2022 0719  MCHC 32.2 09/02/2022 0719   RDW 18.1 (H) 09/02/2022 0719   RDW 16.1 (H) 08/26/2022 1407   RDW 14.1 03/23/2014 2009   LYMPHSABS 2.6 09/02/2022 0719   LYMPHSABS 2.9 03/23/2014 2009   MONOABS 0.4 09/02/2022 0719   MONOABS 0.5 03/23/2014 2009   EOSABS 0.0 09/02/2022 0719   EOSABS 0.1 03/23/2014 2009   BASOSABS 0.0 09/02/2022 0719   BASOSABS 0.0 03/23/2014 2009      Latest Ref Rng & Units 09/02/2022    7:19 AM 09/01/2022    4:26 AM 08/31/2022    8:49 AM  CMP  Glucose 70 - 99 mg/dL 92  99  74   BUN 8 -  23 mg/dL 11  21  34   Creatinine 0.61 - 1.24 mg/dL 1.17  1.46  1.94   Sodium 135 - 145 mmol/L 136  134  139   Potassium 3.5 - 5.1 mmol/L 3.0  2.7  3.9   Chloride 98 - 111 mmol/L 106  104  111   CO2 22 - 32 mmol/L '22  20  18   '$ Calcium 8.9 - 10.3 mg/dL 8.4  8.6  8.7   Total Protein 6.5 - 8.1 g/dL 6.0     Total Bilirubin 0.3 - 1.2 mg/dL 0.6     Alkaline Phos 38 - 126 U/L 68     AST 15 - 41 U/L 31     ALT 0 - 44 U/L 14        Radiology Studies: No results found.   Scheduled Meds:  amLODipine  10 mg Oral Daily   aspirin EC  81 mg Oral Daily   atorvastatin  40 mg Oral Daily   Chlorhexidine Gluconate Cloth  6 each Topical Daily   cilostazol  50 mg Oral BID   heparin  5,000 Units Subcutaneous Q8H   levETIRAcetam  500 mg Oral BID   mouth rinse  15 mL Mouth Rinse 4 times per day   sodium chloride flush  3 mL Intravenous Q12H   tamsulosin  0.4 mg Oral QPC supper   Continuous Infusions:  sodium chloride 10 mL/hr at 08/29/22 1844   sodium chloride 10 mL/hr at 09/01/22 1400   cefTRIAXone (ROCEPHIN)  IV 2 g (09/02/22 0832)     LOS: 4 days   Time spent: [place], MD [facility] To contact the attending provider between 7A-7P or the covering provider during after hours 7P-7A, please log into the web site [email or web address] and access using universal [place] password for that web site. If you do not have the password, please call the hospital operator.  09/03/2022, 8:32 AM

## 2022-09-03 NOTE — Progress Notes (Signed)
Physical Therapy Treatment Patient Details Name: Andres White MRN: [id number] DOB: 12-09-1945 Today's Date: 09/03/2022   History of Present Illness 77 yo male admitted 1/26 with progressive lethargy due to urosepsis with encephalopathy. Pt hypotensive requiring vasopressors. PMHx: CVA with Rt weakness, dementia, Sz, HFpEF, CKD, back pain, gout, HTN    PT Comments    Received pt semi-reclined in bed and reluctantly agreed to PT treatment. Pt required increased time and encouragement to attempt OOB mobility to recliner. Pt required mod A to roll to R and to transfer supine<>sitting EOB from flat bed. Noted pt with posterior lean in sitting and required max A to scoot hips to EOB due to inability to follow cues to "get feet on floor". Attempted x 3 stand from elevated EOB with RW and max/total A, however pt unable to clear buttocks, then requested to lie back down - sit<>supine with min guard. Pt demonstrated difficulty with sequencing/motor planning along with decreased insight into deficits, poor safety awareness, and continued confusion. Recommend SNF at this time unless family is able to provide necessary +2 assist. Acute PT to cont to follow.     Recommendations for follow up therapy are one component of a multi-disciplinary discharge planning process, led by the attending physician.  Recommendations may be updated based on patient status, additional functional criteria and insurance authorization.  Follow Up Recommendations  Skilled nursing-short term rehab (<3 hours/day) Can patient physically be transported by private vehicle: No   Assistance Recommended at Discharge Frequent or constant Supervision/Assistance  Patient can return home with the following A lot of help with bathing/dressing/bathroom;Assistance with cooking/housework;Direct supervision/assist for medications management;Assist for transportation;Help with stairs or ramp for entrance;Two people to help with walking and/or  transfers   Equipment Recommendations  None recommended by PT (has RW in room)    Recommendations for Other Services       Precautions / Restrictions Precautions Precautions: Fall Restrictions Weight Bearing Restrictions: No     Mobility  Bed Mobility Overal bed mobility: Needs Assistance Bed Mobility: Rolling, Supine to Sit, Sit to Supine Rolling: Mod assist   Supine to sit: Mod assist (flat bed) Sit to supine: Min guard   General bed mobility comments: Pt required increased time and max cues to transfer to sitting R EOB. Pt with posterior lean and required max A to scoot hips to EOB to get feet on floor. Patient Response: Flat affect  Transfers Overall transfer level: Needs assistance Equipment used: Rolling walker (2 wheels) Transfers: Sit to/from Stand Sit to Stand: Total assist           General transfer comment: attempted to stand from elevated EOB x 3 trials with RW and max/total A. However, pt unable to clear buttocks and with increased difficulty following cues for hand placement on RW and anterior weight shifiting. Pt politely refused any further participation and requested to lie back down.    Ambulation/Gait               General Gait Details: unable   Stairs             Wheelchair Mobility    Modified Rankin (Stroke Patients Only)       Balance Overall balance assessment: Needs assistance Sitting-balance support: Bilateral upper extremity supported, Feet supported Sitting balance-Leahy Scale: Fair Sitting balance - Comments: pt able to maintain static sitting balance with supervision and min cues to correct posterior lean Postural control: Posterior lean Standing balance support: Bilateral upper extremity supported, Reliant on  assistive device for balance Standing balance-Leahy Scale: Zero Standing balance comment: unable to come into complete standing position                            Cognition Arousal/Alertness:  Lethargic Behavior During Therapy: Flat affect Overall Cognitive Status: Impaired/Different from baseline                       Memory: Decreased short-term memory Following Commands: Follows one step commands inconsistently, Follows one step commands with increased time Safety/Judgement: Decreased awareness of safety, Decreased awareness of deficits     General Comments: pt remains confused. required maximal encouragement to attempt OOB mobility but ultimately unable to make it to recliner due to poor sequencing/understanding and decreased motivation.        Exercises      General Comments        Pertinent Vitals/Pain Pain Assessment Pain Assessment: No/denies pain    Home Living                          Prior Function            PT Goals (current goals can now be found in the care plan section) Acute Rehab PT Goals Patient Stated Goal: return home, be outside PT Goal Formulation: With patient/family Time For Goal Achievement: 09/15/22 Potential to Achieve Goals: Fair Progress towards PT goals: Progressing toward goals    Frequency    Min 3X/week      PT Plan Current plan remains appropriate    Co-evaluation              AM-PAC PT "6 Clicks" Mobility   Outcome Measure  Help needed turning from your back to your side while in a flat bed without using bedrails?: A Lot Help needed moving from lying on your back to sitting on the side of a flat bed without using bedrails?: A Lot Help needed moving to and from a bed to a chair (including a wheelchair)?: A Lot Help needed standing up from a chair using your arms (e.g., wheelchair or bedside chair)?: A Lot Help needed to walk in hospital room?: A Lot Help needed climbing 3-5 steps with a railing? : Total 6 Click Score: 11    End of Session Equipment Utilized During Treatment: Gait belt Activity Tolerance: Patient limited by fatigue;Other (comment) (decreased motivation for OOB  mobility) Patient left: in bed;with call bell/phone within reach;with bed alarm set Nurse Communication: Mobility status PT Visit Diagnosis: Other abnormalities of gait and mobility (R26.89);Muscle weakness (generalized) (M62.81)     Time: 3825-0539 PT Time Calculation (min) (ACUTE ONLY): 24 min  Charges:  $Therapeutic Activity: 23-37 mins                     Becky Sax PT, DPT  Andres White 09/03/2022, 8:43 AM

## 2022-09-04 ENCOUNTER — Other Ambulatory Visit: Payer: Self-pay | Admitting: Cardiology

## 2022-09-04 DIAGNOSIS — I639 Cerebral infarction, unspecified: Secondary | ICD-10-CM

## 2022-09-04 DIAGNOSIS — N1832 Chronic kidney disease, stage 3b: Secondary | ICD-10-CM | POA: Diagnosis not present

## 2022-09-04 DIAGNOSIS — N170 Acute kidney failure with tubular necrosis: Secondary | ICD-10-CM | POA: Diagnosis not present

## 2022-09-04 LAB — CBC
HCT: 24.1 % — ABNORMAL LOW (ref 39.0–52.0)
Hemoglobin: 8 g/dL — ABNORMAL LOW (ref 13.0–17.0)
MCH: 26.6 pg (ref 26.0–34.0)
MCHC: 33.2 g/dL (ref 30.0–36.0)
MCV: 80.1 fL (ref 80.0–100.0)
Platelets: 177 10*3/uL (ref 150–400)
RBC: 3.01 MIL/uL — ABNORMAL LOW (ref 4.22–5.81)
RDW: 18.5 % — ABNORMAL HIGH (ref 11.5–15.5)
WBC: 8.8 10*3/uL (ref 4.0–10.5)
nRBC: 0 % (ref 0.0–0.2)

## 2022-09-04 LAB — GLUCOSE, CAPILLARY
Glucose-Capillary: 126 mg/dL — ABNORMAL HIGH (ref 70–99)
Glucose-Capillary: 115 mg/dL — ABNORMAL HIGH (ref 70–99)

## 2022-09-04 LAB — RENAL FUNCTION PANEL
Albumin: 2.5 g/dL — ABNORMAL LOW (ref 3.5–5.0)
Anion gap: 10 (ref 5–15)
BUN: 7 mg/dL — ABNORMAL LOW (ref 8–23)
CO2: 20 mmol/L — ABNORMAL LOW (ref 22–32)
Calcium: 8.7 mg/dL — ABNORMAL LOW (ref 8.9–10.3)
Chloride: 104 mmol/L (ref 98–111)
Creatinine, Ser: 1.07 mg/dL (ref 0.61–1.24)
GFR, Estimated: >60 mL/min (ref >60)
Glucose, Bld: 118 mg/dL — ABNORMAL HIGH (ref 70–99)
Phosphorus: 2.1 mg/dL — ABNORMAL LOW (ref 2.5–4.6)
Potassium: 3.5 mmol/L (ref 3.5–5.1)
Sodium: 134 mmol/L — ABNORMAL LOW (ref 135–145)

## 2022-09-04 MED ORDER — TAMSULOSIN HCL 0.4 MG PO CAPS: 0.4000 mg | ORAL_CAPSULE | Freq: Every day | ORAL | 8 refills | Status: AC

## 2022-09-04 MED ORDER — CEPHALEXIN 500 MG PO CAPS: 500.0000 mg | ORAL_CAPSULE | Freq: Two times a day (BID) | ORAL | Status: DC

## 2022-09-04 MED ORDER — FUROSEMIDE 20 MG PO TABS
20.0000 mg | ORAL_TABLET | ORAL | Status: DC
  Administered 2022-09-04: 20 mg via ORAL
  Filled 2022-09-04: qty 1

## 2022-09-04 MED ORDER — FUROSEMIDE 20 MG PO TABS: 20.0000 mg | ORAL_TABLET | ORAL | 5 refills | Status: DC

## 2022-09-04 MED ORDER — CEPHALEXIN 500 MG PO CAPS: 500.0000 mg | ORAL_CAPSULE | Freq: Two times a day (BID) | ORAL | 0 refills | Status: DC

## 2022-09-04 NOTE — Progress Notes (Incomplete)
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 at baseline HFpEF HTN CKD 3B Gout BPH  Brought to [facility] ED 1/26 weakness lethargy since upper respiratory illness-some concern for UTI AKI On admit potassium 2.3 BUNs/creatinine 45/2.9 lactic acid 2.6 admitted for AKI  1/27 progressive hypotension ental status change requiring RRT peripheral norepinephrine and fluid boluses and developed sepsis 1/29 transfer back to [facility]   Hospital-Problem based course  Sepsis present on admission, WBC 21 lactic acid 2.6 -Source likely UTI unfortunately no urine collected, BC x 2 from admit neg - Completing 7 days total antibiotics 09/04/2022   AKI on admission -  -  Hypokalemia on admission - Potassium now improved, periodic labs  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 - Resume home health on discharge as patient family wishes for this instead of skilled - Continue Keppra 500 twice daily Continue aspirin 81 daily Lipitor 40 daily  Borderline microcytic anemia With probable dilutional component from sepsis resuscitation - Does not meet criteria for transfusion - is not actively bleeding  Gout - Holding allopurinol 100 daily  BPH - Continue Flomax 0.4 daily   DVT prophylaxis: Heparin Code Status: Presumed full Family Communication:  Disposition:  Status is: Inpatient Remains inpatient appropriate because:   Finish course of antibiotics    Subjective: Does not know where he is does not know the time or place, can tell me that he is in [place] when pressed but cannot localize to where he is Some echolalia  Objective: Vitals:   09/03/22 1655  09/03/22 2024 09/04/22 0505 09/04/22 0725  BP: 139/69 138/71 (!) 150/91 (!) 140/82  Pulse: 73 78 (!) 103 (!) 110  Resp: '18 15 20   '$ Temp: 98.8 F (37.1 C) 99.1 F (37.3 C) 97.6 F (36.4 C) 99.6 F (37.6 C)  TempSrc: Oral Oral Oral Oral  SpO2: 100% 100% 98% 100%  Weight:      Height:        Intake/Output Summary (Last 24 hours) at 09/04/2022 1004 Last data filed at 09/04/2022 0456 Gross per 24 hour  Intake 440 ml  Output 900 ml  Net -460 ml    Filed Weights   08/30/22 0109 08/31/22 0600 09/03/22 0500  Weight: 79.5 kg 81.2 kg 83 kg    Examination:  Awake coherent no distress EOMI NCAT no focal deficit no icterus no pallor Chest is clear no added sound no wheeze rales rhonchi Abdomen soft no rebound Dusky appearing lower extremities warm S1-S2 no murmur Neuro intact moving 4 limbs equally  Data Reviewed: personally reviewed   CBC    Component Value Date/Time   WBC 8.8 09/04/2022 0436   RBC 3.01 (L) 09/04/2022 0436   HGB 8.0 (L) 09/04/2022 0436   HGB 9.7 (L) 08/26/2022 1407   HCT 24.1 (L) 09/04/2022 0436   HCT 31.4 (L) 08/26/2022 1407   PLT 177 09/04/2022 0436   PLT 326 08/26/2022 1407   MCV 80.1 09/04/2022 0436   MCV 82 08/26/2022 1407   MCV 95 03/23/2014 2009   MCH 26.6 09/04/2022 0436   MCHC 33.2 09/04/2022 0436   RDW 18.5 (H) 09/04/2022 0436   RDW 16.1 (H) 08/26/2022 1407   RDW 14.1 03/23/2014 2009   LYMPHSABS 2.8 09/03/2022 0741  LYMPHSABS 2.9 03/23/2014 2009   MONOABS 0.6 09/03/2022 0741   MONOABS 0.5 03/23/2014 2009   EOSABS 0.0 09/03/2022 0741   EOSABS 0.1 03/23/2014 2009   BASOSABS 0.0 09/03/2022 0741   BASOSABS 0.0 03/23/2014 2009      Latest Ref Rng & Units 09/04/2022    4:36 AM 09/03/2022    7:41 AM 09/02/2022    7:19 AM  CMP  Glucose 70 - 99 mg/dL 118  92  92   BUN 8 - 23 mg/dL '7  8  11   '$ Creatinine 0.61 - 1.24 mg/dL 1.07  1.03  1.17   Sodium 135 - 145 mmol/L 134  135  136   Potassium 3.5 - 5.1 mmol/L 3.5  3.5  3.0   Chloride 98 - 111  mmol/L 104  108  106   CO2 22 - 32 mmol/L '20  21  22   '$ Calcium 8.9 - 10.3 mg/dL 8.7  8.3  8.4   Total Protein 6.5 - 8.1 g/dL   6.0   Total Bilirubin 0.3 - 1.2 mg/dL   0.6   Alkaline Phos 38 - 126 U/L   68   AST 15 - 41 U/L   31   ALT 0 - 44 U/L   14      Radiology Studies: No results found.   Scheduled Meds:  amLODipine  10 mg Oral Daily   aspirin EC  81 mg Oral Daily   atorvastatin  40 mg Oral Daily   cephALEXin  500 mg Oral Q12H   Chlorhexidine Gluconate Cloth  6 each Topical Daily   cilostazol  50 mg Oral BID   furosemide  20 mg Oral QODAY   heparin  5,000 Units Subcutaneous Q8H   levETIRAcetam  500 mg Oral BID   mouth rinse  15 mL Mouth Rinse 4 times per day   sodium chloride flush  3 mL Intravenous Q12H   tamsulosin  0.4 mg Oral QPC supper   Continuous Infusions:  sodium chloride 10 mL/hr at 08/29/22 1844   sodium chloride 10 mL/hr at 09/01/22 1400     LOS: 5 days   Time spent: [place], MD [facility] To contact the attending provider between 7A-7P or the covering provider during after hours 7P-7A, please log into the web site [email or web address] and access using universal [place] password for that web site. If you do not have the password, please call the hospital operator.  09/04/2022, 10:04 AM

## 2022-09-04 NOTE — TOC Transition Note (Signed)
Transition of Care [facility]) - CM/SW Discharge Note   Patient Details  Name: Andres White MRN: [id number] Date of Birth: [birth year]  Transition of Care [facility]) CM/SW Contact:  Curlene Labrum, RN Phone Number: 09/04/2022, 11:55 AM   Clinical Narrative:    CM met with the patient's daughter at the bedside and she wants to have the patient discharge to home today with provision of hospital bed and hoyer lift to deliver to the home - pending tomorrow through [place].  The patient's daughter was given choice regarding DME company and she prefers [employer].  I called Brenton Grills, CM at [facility] and he plans to deliver the hospital bed and hoyer lift to the home tomorrow.  Daughter is aware and Attending MD will co-sign.  Daughter is requesting PTAR to home. [facility] was called for transportation home and the daughter is taking the [facility] and RW home with her and is expecting the patient's arrival today.     Barriers to Discharge: Continued Medical Work up   Patient Goals and CMS Choice CMS Medicare.gov Compare Post Acute Care list provided to:: Patient Represenative (must comment) Choice offered to / list presented to : Adult Children  Discharge Placement                         Discharge Plan and Services Additional resources added to the After Visit Summary for     Discharge Planning Services: CM Consult Post Acute Care Choice: Home Health          DME Arranged: Walker rolling DME Agency: [employer]       HH Arranged: PT, OT HH Agency: [facility] Date [place]: 09/02/22 Time [place]: 0911 Representative spoke with at [place]: Otila Kluver  Social Determinants of Health ([place]) Interventions SDOH Screenings   Food Insecurity: No Food Insecurity (03/31/2022)  Housing: Low Risk  (12/11/2021)  Transportation Needs: No Transportation Needs (12/11/2021)  Alcohol Screen: Low Risk  (03/31/2022)  Depression (PHQ2-9): Low Risk  (04/15/2022)  Financial  Resource Strain: Low Risk  (03/31/2022)  Physical Activity: Inactive (08/26/2022)  Social Connections: Socially Isolated (03/31/2022)  Stress: Stress Concern Present (08/26/2022)  Tobacco Use: High Risk (08/30/2022)     Readmission Risk Interventions     No data to display

## 2022-09-04 NOTE — Discharge Summary (Signed)
Physician Discharge Summary  Andres White NTZ:[id number] DOB: Jul 10, 1946 DOA: 08/29/2022  PCP: Marrian Salvage, FNP  Admit date: 08/29/2022 Discharge date: 09/04/2022  Time spent: 44 minutes  Recommendations for Outpatient Follow-up:  Discontinued completely gabapentin, citalopram Lasix changed to every other day Needs screening labs CBC Chem-12 in about a week Home health ordered RN and PT for assistance at home Please screen for dementia in the outpatient setting with dementia tool Is on cilostazol for unclear reasons after search through chart could not find out reasoning-May need to be discontinued in the outpatient setting if no indication  Discharge Diagnoses:  MAIN problem for hospitalization   Severe sepsis present on admission with AKI AKI on admit 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 Microcytic anemia Gout   Please see below for itemized issues addressed in [place]- refer to other progress notes for clarity if needed  Discharge Condition: Improved but overall guarded  Diet recommendation: Heart healthy  Filed Weights   08/30/22 0109 08/31/22 0600 09/03/22 0500  Weight: 79.5 kg 81.2 kg 40 kg    77 year old home dwelling black male 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 underlying intracranial stenosis and brainstem 70% left 90% left vertebral complicated by 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 at baseline HFpEF HTN CKD 3B Gout BPH  Brought to [place] ED 1/26 weakness lethargy since upper respiratory illness-some concern for UTI AKI On admit potassium 2.3 BUNs/creatinine 45/2.9 lactic acid 2.6 admitted for AKI  1/27 progressive hypotension ental status change requiring RRT peripheral norepinephrine and fluid boluses and developed sepsis 1/29 transfer back to [facility]   Hospital-Problem based course  Sepsis present on admission, WBC 21 lactic acid 2.6 -Source likely UTI unfortunately no urine collected, BC x  2 from admit neg - Completing 7 days total antibiotics 09/04/2022, last dose of Keflex given to take home on 2/1  AKI on admission - Significantly improved at time of discharge, initial thought was to hold Lasix completely - I resumed Lasix every other day 20 mg on discharge and have communicated this with family  Hypokalemia on admission - Potassium was replaced and has resolved, the patient will need labs in about 1 week's time  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 - Resume home health on discharge as patient family wishes for this instead of skilled - Continue Keppra 500 twice daily -Continue aspirin 81 daily Lipitor 40 daily -Is on cilostazol-usually this is for angina-this may need to be discussed in the outpatient setting  Borderline microcytic anemia With probable dilutional component from sepsis resuscitation - Does not meet criteria for transfusion - is not actively bleeding -Will need outpatient follow-up for this  Gout - Holding allopurinol 100 daily  BPH - Continue Flomax 0.4 daily   Discharge Exam: Vitals:   09/04/22 0505 09/04/22 0725  BP: (!) 150/91 (!) 140/82  Pulse: (!) 103 (!) 110  Resp: 20   Temp: 97.6 F (36.4 C) 99.6 F (37.6 C)  SpO2: 98% 100%    Subj on day of d/c   Mumbles incoherently says good morning but cannot give me a review of systems otherwise  General Exam on discharge  EOMI NCAT no focal deficit but does keep eyes closed predominantly through the hospital stay Chest is clear no rales rhonchi wheeze Is able to move limbs but this is limited by effort   Discharge Instructions   Discharge Instructions     Diet - low sodium heart healthy   Complete by: As directed  Discharge instructions   Complete by: As directed    Look at meds and changes as we have made some to his Lasix gabapentin as well as citalopram It appears that he may have at least some mild dementia-he is unable to orient although this may  have been because he was sick-he will need either as an outpatient at physician office or with home health and evaluation for dementia with Mini-Mental status exam or MoCA test and this can help [place] get a baseline Please have him follow-up at his regular physician in the next week or so and ensure that he gets these labs performed, alternatively you may be able to get the labs drawn by the home health RN It would be my recommendation to resume home health as you do not want to take him to skilled facility which is perfectly reasonable It would be a good idea to have a goals of care discussion as an outpatient   Increase activity slowly   Complete by: As directed       Allergies as of 09/04/2022   No Known Allergies      Medication List     STOP taking these medications    citalopram 20 MG tablet Commonly known as: CeleXA   gabapentin 300 MG capsule Commonly known as: NEURONTIN       TAKE these medications    acetaminophen 325 MG tablet Commonly known as: TYLENOL Take 1-2 tablets (325-650 mg total) by mouth every 4 (four) hours as needed for mild pain.   allopurinol 100 MG tablet Commonly known as: ZYLOPRIM TAKE 1 TABLET(100 MG) BY MOUTH DAILY What changed: See the new instructions.   amLODipine 10 MG tablet Commonly known as: NORVASC Take 1 tablet (10 mg total) by mouth daily.   aspirin EC 81 MG tablet Take 1 tablet (81 mg total) by mouth daily. What changed: when to take this   atorvastatin 40 MG tablet Commonly known as: LIPITOR TAKE 1 TABLET BY MOUTH ONCE DAILY   cephALEXin 500 MG capsule Commonly known as: KEFLEX Take 1 capsule (500 mg total) by mouth every 12 (twelve) hours.   cilostazol 50 MG tablet Commonly known as: PLETAL TAKE 1 TABLET BY MOUTH TWICE DAILY   colchicine 0.6 MG tablet Take 1 tablet (0.6 mg total) by mouth daily. As needed What changed:  when to take this reasons to take this   cyanocobalamin 1000 MCG tablet Commonly known as:  VITAMIN B12 Take 1,000 mcg by mouth daily.   furosemide 20 MG tablet Commonly known as: LASIX Take 1 tablet (20 mg total) by mouth every other day. What changed: when to take this   hydrocerin Crea Apply 1 application. topically 2 (two) times daily. What changed: additional instructions   levETIRAcetam 500 MG tablet Commonly known as: KEPPRA Take 1 tablet (500 mg total) by mouth 2 (two) times daily.   multivitamin with minerals Tabs tablet Take 1 tablet by mouth daily. What changed: when to take this   pantoprazole 40 MG tablet Commonly known as: PROTONIX TAKE 1 TABLET BY MOUTH TWICE DAILY   tamsulosin 0.4 MG Caps capsule Commonly known as: FLOMAX TAKE 2 CAPSULES BY MOUTH ONCE DAILY AFTER SUPPER What changed: See the new instructions.               Durable Medical Equipment  (From admission, onward)           Start     Ordered   09/02/22 0919  For home use  only DME Walker rolling  Once       Question Answer Comment  Walker: With 5 Inch Wheels   Patient needs a walker to treat with the following condition Hx of TIA (transient ischemic attack) and stroke   Patient needs a walker to treat with the following condition Generalized weakness   Patient needs a walker to treat with the following condition Right sided weakness      09/02/22 0920           No Known Allergies    The results of significant diagnostics from this hospitalization (including imaging, microbiology, ancillary and laboratory) are listed below for reference.    Significant Diagnostic Studies: CT Renal Stone Study  Result Date: 08/29/2022 CLINICAL DATA:  Abdominal/flank pain, stone suspected Weakness. Lethargy. EXAM: CT ABDOMEN AND PELVIS WITHOUT CONTRAST TECHNIQUE: Multidetector CT imaging of the abdomen and pelvis was performed following the standard protocol without IV contrast. RADIATION DOSE REDUCTION: This exam was performed according to the departmental dose-optimization program  which includes automated exposure control, adjustment of the mA and/or kV according to patient size and/or use of iterative reconstruction technique. COMPARISON:  None Available. FINDINGS: Lower chest: Mild basilar atelectasis with trace pleural thickening. No significant pleural effusion. Hepatobiliary: Mild motion artifact limitations. Allowing for this, no focal hepatic abnormality. Gallbladder physiologically distended, no calcified stone. No biliary dilatation. Pancreas: No ductal dilatation or inflammation. Spleen: Normal in size without focal abnormality. Adrenals/Urinary Tract: Normal adrenal glands. No hydronephrosis. No renal calculi. No evidence of focal renal abnormality. Slight lobulated renal contours. Decompressed ureters without stones along the course. Partially distended urinary bladder with air-fluid level. No definite bladder wall thickening. No bladder stone. Stomach/Bowel: Unremarkable unenhanced appearance of the stomach. There is no small bowel obstruction or inflammatory change. Normal appendix. Submucosal fatty infiltration of the right colon suggestive of prior or chronic inflammation. No acute colonic inflammatory change. Small volume of colonic stool. Sigmoid colonic redundancy. Vascular/Lymphatic: Moderate aortic atherosclerosis. Aortic tortuosity without aneurysm. No enlarged lymph nodes in the abdomen or pelvis by size criteria. 9 mm lymph nodes in the right and left external iliac chain. Reproductive: Prostate is unremarkable by CT. Other: Mild presacral soft tissue thickening. No ascites. No free air. Musculoskeletal: Scoliosis with diffuse degenerative change in the lumbar spine. Moderate hip degenerative change. There are no acute or suspicious osseous abnormalities. IMPRESSION: 1. No renal stones or obstructive uropathy. 2. Air-fluid level in the urinary bladder, may be due to recent instrumentation or urinary tract infection. Recommend correlation with urinalysis. 3. Submucosal  fatty infiltration of the right colon suggestive of prior or chronic inflammation. No acute bowel inflammation. 4. Nonspecific presacral soft tissue thickening Aortic Atherosclerosis (ICD10-I70.0). Electronically Signed   By: Keith Rake M.D.   On: 08/29/2022 18:27   CT Head Wo Contrast  Result Date: 08/29/2022 CLINICAL DATA:  Altered mental status EXAM: CT HEAD WITHOUT CONTRAST TECHNIQUE: Contiguous axial images were obtained from the base of the skull through the vertex without intravenous contrast. RADIATION DOSE REDUCTION: This exam was performed according to the departmental dose-optimization program which includes automated exposure control, adjustment of the mA and/or kV according to patient size and/or use of iterative reconstruction technique. COMPARISON:  09/16/2020 FINDINGS: Brain: No evidence of acute infarction, hemorrhage, hydrocephalus, extra-axial collection or mass lesion/mass effect. Chronic atrophic and ischemic changes are noted similar to that seen on prior exam. Prior infarcts are noted of the thalami bilaterally as well as in the right posterior parietooccipital lobe and cerebellar hemispheres.  Vascular: No hyperdense vessel or unexpected calcification. Skull: Normal. Negative for fracture or focal lesion. Sinuses/Orbits: No acute finding. Other: None. IMPRESSION: Chronic atrophic and ischemic changes.  No acute abnormality noted. Electronically Signed   By: Inez Catalina M.D.   On: 08/29/2022 17:43   DG Chest Port 1 View  Result Date: 08/29/2022 CLINICAL DATA:  Altered mental status EXAM: PORTABLE CHEST 1 VIEW COMPARISON:  09/19/2020 FINDINGS: Cardiac shadow is within normal limits. Aortic calcifications are noted. Lungs are well aerated bilaterally. Mild left basilar atelectasis is seen. No bony abnormality is noted. IMPRESSION: Mild left basilar atelectasis. Electronically Signed   By: Inez Catalina M.D.   On: 08/29/2022 17:41    Microbiology: Recent Results (from the past 240  hour(s))  Microscopic Examination     Status: Abnormal   Collection Time: 08/26/22  2:07 PM  Result Value Ref Range Status   WBC, UA >30 (A) 0 - 5 /hpf Final   RBC, Urine 0-2 0 - 2 /hpf Final   Epithelial Cells (non renal) None seen 0 - 10 /hpf Final   Casts None seen None seen /lpf Final   Bacteria, UA Many (A) None seen/Few Final  Blood culture (routine x 2)     Status: None   Collection Time: 08/29/22  5:14 PM   Specimen: BLOOD RIGHT FOREARM  Result Value Ref Range Status   Specimen Description   Final    BLOOD RIGHT FOREARM Performed at [facility], [address] [place], [place] [address]    Special Requests   Final    BOTTLES DRAWN AEROBIC AND ANAEROBIC Blood Culture results may not be optimal due to an inadequate volume of blood received in culture bottles Performed at [facility], [address] [place], [place] [address]    Culture   Final    NO GROWTH 5 DAYS Performed at [facility], [place] [address], [place], [place] [address]    Report Status 09/03/2022 FINAL  Final  Blood culture (routine x 2)     Status: None   Collection Time: 08/29/22  5:23 PM   Specimen: Left Antecubital; Blood  Result Value Ref Range Status   Specimen Description   Final    LEFT ANTECUBITAL BLOOD Performed at [facility], [place] [address] [place], [place] [address]    Special Requests   Final    BOTTLES DRAWN AEROBIC AND ANAEROBIC Blood Culture adequate volume Performed at [facility], [address] [place], [place] [address]    Culture   Final    NO GROWTH 5 DAYS Performed at [facility], [place] [address] [place],  [address]    Report Status 09/03/2022 FINAL  Final  Resp panel by RT-PCR (RSV, Flu A&B, Covid) Anterior Nasal Swab     Status: None   Collection Time: 08/29/22  5:28 PM   Specimen: Anterior Nasal Swab  Result Value Ref Range Status   SARS Coronavirus 2 by RT PCR NEGATIVE NEGATIVE Final    Comment:  (NOTE) SARS-CoV-2 target nucleic acids are NOT DETECTED.  The SARS-CoV-2 RNA is generally detectable in upper respiratory specimens during the acute phase of infection. The lowest concentration of SARS-CoV-2 viral copies this assay can detect is 138 copies/mL. A negative result does not preclude SARS-Cov-2 infection and should not be used as the sole basis for treatment or other patient management decisions. A negative result may occur with  improper  specimen collection/handling, submission of specimen other than nasopharyngeal swab, presence of viral mutation(s) within the areas targeted by this assay, and inadequate number of viral copies(<138 copies/mL). A negative result must be combined with clinical observations, patient history, and epidemiological information. The expected result is Negative.  Fact Sheet for Patients:  EntrepreneurPulse.com.au  Fact Sheet for Healthcare Providers:  IncredibleEmployment.be  This test is no t yet approved or cleared by the [place] FDA and  has been authorized for detection and/or diagnosis of SARS-CoV-2 by FDA under an Emergency Use Authorization (EUA). This EUA will remain  in effect (meaning this test can be used) for the duration of the COVID-19 declaration under Section 564(b)(1) of the Act, 21 U.S.C.section 360bbb-3(b)(1), unless the authorization is terminated  or revoked sooner.       Influenza A by PCR NEGATIVE NEGATIVE Final   Influenza B by PCR NEGATIVE NEGATIVE Final    Comment: (NOTE) The Xpert Xpress SARS-CoV-2/FLU/RSV plus assay is intended as an aid in the diagnosis of influenza from Nasopharyngeal swab specimens and should not be used as a sole basis for treatment. Nasal washings and aspirates are unacceptable for Xpert Xpress SARS-CoV-2/FLU/RSV testing.  Fact Sheet for Patients: EntrepreneurPulse.com.au  Fact Sheet for Healthcare  Providers: IncredibleEmployment.be  This test is not yet approved or cleared by the [place] FDA and has been authorized for detection and/or diagnosis of SARS-CoV-2 by FDA under an Emergency Use Authorization (EUA). This EUA will remain in effect (meaning this test can be used) for the duration of the COVID-19 declaration under Section 564(b)(1) of the Act, 21 U.S.C. section 360bbb-3(b)(1), unless the authorization is terminated or revoked.     Resp Syncytial Virus by PCR NEGATIVE NEGATIVE Final    Comment: (NOTE) Fact Sheet for Patients: EntrepreneurPulse.com.au  Fact Sheet for Healthcare Providers: IncredibleEmployment.be  This test is not yet approved or cleared by the [place] FDA and has been authorized for detection and/or diagnosis of SARS-CoV-2 by FDA under an Emergency Use Authorization (EUA). This EUA will remain in effect (meaning this test can be used) for the duration of the COVID-19 declaration under Section 564(b)(1) of the Act, 21 U.S.C. section 360bbb-3(b)(1), unless the authorization is terminated or revoked.  Performed at [facility], [address] [place], [place] [address]   MRSA Next Gen by PCR, Nasal     Status: None   Collection Time: 08/30/22  1:12 AM   Specimen: Nasal Mucosa; Nasal Swab  Result Value Ref Range Status   MRSA by PCR Next Gen NOT DETECTED NOT DETECTED Final    Comment: (NOTE) The GeneXpert MRSA Assay (FDA approved for NASAL specimens only), is one component of a comprehensive MRSA colonization surveillance program. It is not intended to diagnose MRSA infection nor to guide or monitor treatment for MRSA infections. Test performance is not FDA approved in patients less than 57 years old. Performed at [facility], [place] [address] [place], [place] [address]      Labs: Basic Metabolic Panel: Recent Labs  Lab 08/30/22 1952 08/31/22 0849  09/01/22 0426 09/02/22 0719 09/03/22 0741 09/04/22 0436  NA 138 139 134* 136 135 134*  K 3.3* 3.9 2.7* 3.0* 3.5 3.5  CL 107 111 104 106 108 104  CO2 22 18* 20* 22 21* 20*  GLUCOSE 92 74 99 92 92 118*  BUN 36* 34* '21 11 8 '$ 7*  CREATININE 2.23* 1.94* 1.46* 1.17 1.03 1.07  CALCIUM 8.1* 8.7* 8.6* 8.4* 8.3*  8.7*  MG 2.1 2.1 1.7 1.7 1.8  --   PHOS  --  2.6 1.8* 2.8 2.3* 2.1*   Liver Function Tests: Recent Labs  Lab 08/29/22 1728 08/30/22 6286 08/31/22 0849 09/01/22 0426 09/02/22 0719 09/04/22 0436  AST 54* 38  --   --  31  --   ALT 28 20  --   --  14  --   ALKPHOS 94 76  --   --  68  --   BILITOT 0.8 0.6  --   --  0.6  --   PROT 8.8* 6.6  --   --  6.0*  --   ALBUMIN 3.2* 2.4* 2.3* 2.3* 2.2* 2.5*   No results for input(s): "LIPASE", "AMYLASE" in the last 168 hours. No results for input(s): "AMMONIA" in the last 168 hours. CBC: Recent Labs  Lab 08/29/22 1728 08/29/22 1734 08/31/22 0849 09/01/22 0426 09/02/22 0719 09/03/22 0741 09/04/22 0436  WBC 8.7   < > 16.4* 10.7* 6.8 7.6 8.8  NEUTROABS 6.4  --   --   --  3.7 4.1  --   HGB 10.1*   < > 8.0* 7.7* 7.3* 7.6* 8.0*  HCT 32.6*   < > 27.0* 25.4* 22.7* 24.2* 24.1*  MCV 81.9   < > 86.0 85.2 80.8 82.3 80.1  PLT 306   < > 171 174 159 177 177   < > = values in this interval not displayed.   Cardiac Enzymes: No results for input(s): "CKTOTAL", "CKMB", "CKMBINDEX", "TROPONINI" in the last 168 hours. BNP: BNP (last 3 results) No results for input(s): "BNP" in the last 8760 hours.  ProBNP (last 3 results) No results for input(s): "PROBNP" in the last 8760 hours.  CBG: Recent Labs  Lab 09/03/22 0912 09/03/22 1131 09/03/22 1658 09/03/22 2310 09/04/22 0726  GLUCAP 83 90 106* 124* 126*       Signed:  Nita Sells MD   [facility] 09/04/2022, 10:16 AM

## 2022-09-04 NOTE — Progress Notes (Signed)
Event monitor ordered at the request of Mayer Camel, MD and Dr. Leonie Man with neurology due to worsening right-sided weakness, CVA concern.

## 2022-09-04 NOTE — Progress Notes (Signed)
Patients daughter and grandson unable to safely transfer patient into the W/C. [facility] called to transport patient home.

## 2022-09-04 NOTE — Progress Notes (Signed)
Occupational Therapy Treatment Patient Details Name: Andres White MRN: [id number] DOB: 06/05/1946 Today's Date: 09/04/2022   History of present illness 77 yo male admitted 1/26 with progressive lethargy due to urosepsis with encephalopathy. Pt hypotensive requiring vasopressors. PMHx: CVA with Rt weakness, dementia, Sz, HFpEF, CKD, back pain, gout, HTN   OT comments  Pt currently with decreased focused attention during session.  Eyes remained closed 90% of the time with only minimal initiation for sitting balance noted on one occasion.  Otherwise, pt needed total assist for all transitions from rolling in bed, transfer to EOB, and for partial standing.  Based on current functional level, feel pt will continue to benefit from acute care OT to help progress initiation, attention, and ADL completion.  If discharging home with family as previous notes suggest, recommend HHOT , hospital bed, and hoyer lift as pt is not safe to transfer squat or stand pivot at this time based on limited active participation.       Recommendations for follow up therapy are one component of a multi-disciplinary discharge planning process, led by the attending physician.  Recommendations may be updated based on patient status, additional functional criteria and insurance authorization.    Follow Up Recommendations  Home health OT     Assistance Recommended at Discharge Frequent or constant Supervision/Assistance  Patient can return home with the following  Two people to help with walking and/or transfers;Two people to help with bathing/dressing/bathroom;Direct supervision/assist for medications management;Help with stairs or ramp for entrance;Assist for transportation;Assistance with cooking/housework;Direct supervision/assist for financial management;Assistance with feeding   Equipment Recommendations  Hospital bed;Other (comment) Harrel Lemon lift)       Precautions / Restrictions Precautions Precautions:  Fall Restrictions Weight Bearing Restrictions: No       Mobility Bed Mobility Overal bed mobility: Needs Assistance Bed Mobility: Supine to Sit, Sit to Supine, Rolling Rolling: Total assist   Supine to sit: Total assist Sit to supine: Total assist   General bed mobility comments: Pt did not initiate assistance in any of the bed mobility tasks.    Transfers Overall transfer level: Needs assistance   Transfers: Sit to/from Stand Sit to Stand: Total assist           General transfer comment: Total assist for sit to partial stand secondary to decreased active participation and sustained attention.     Balance Overall balance assessment: Needs assistance Sitting-balance support: Bilateral upper extremity supported, Feet supported Sitting balance-Leahy Scale: Zero Sitting balance - Comments: Pt with LOB posteriorly and to the left   Standing balance support: During functional activity Standing balance-Leahy Scale: Zero Standing balance comment: unable to achieve full upright standing                           ADL either performed or assessed with clinical judgement   ADL Overall ADL's : Needs assistance/impaired                     Lower Body Dressing: Bed level;Total assistance Lower Body Dressing Details (indicate cue type and reason): total assist for donning shoes secondary to decreased initiation to try task and safety risk to sit EOB and attempt             Functional mobility during ADLs: Total assistance (partial standing) General ADL Comments: Pt with decreased active initiation of most tasks during session.  He exhibited overall total assist for static sitting balance with hard pull/lean to  the left and posteriorly in sitting.  Once instant of less than 30 seconds where he maintained balance with min guard and then back to total.  He was unable to help initiate any sit to stand with or with the RW.  Therapist had to provide total assist  for partial standing with therapist positioned in front of him.  Increased pain noted in the LLE but pt unable to actively state where he was hurting when he was asked multiple times.  Note pt discharging today.  Feel there is no safe way family can transport him based on current level.  Discussed with MD and other team members that he would need a lift at home based on current level and likely ambulance transport.      Cognition Arousal/Alertness: Lethargic Behavior During Therapy: Flat affect Overall Cognitive Status: No family/caregiver present to determine baseline cognitive functioning Area of Impairment: Problem solving, Orientation, Attention, Memory, Following commands, Safety/judgement, Awareness                     Memory: Decreased short-term memory Following Commands:  (Pt not following one step commands this session) Safety/Judgement: Decreased awareness of safety, Decreased awareness of deficits   Problem Solving: Slow processing, Decreased initiation, Difficulty sequencing, Requires tactile cues General Comments: Pt with eyes closed 90% of session.  No active initiation of any task related to dressing, supine to sit, or attempted standing.  Pt did have one episode of initiation of sitting balance lasting less than 30 seconds.                   Pertinent Vitals/ Pain       Pain Assessment Pain Assessment: Faces Faces Pain Scale: Hurts whole lot Pain Location: left foot/leg Pain Descriptors / Indicators: Moaning, Grimacing Pain Intervention(s): Limited activity within patient's tolerance, Monitored during session, Repositioned         Frequency  Min 2X/week        Progress Toward Goals  OT Goals(current goals can now be found in the care plan section)  Progress towards OT goals: Not progressing toward goals - comment;OT to reassess next treatment (Pt with decreased initiation of tasks this session.)  Acute Rehab OT Goals Patient Stated Goal: Pt did  not state OT Goal Formulation: Patient unable to participate in goal setting Time For Goal Achievement: 09/15/22 Potential to Achieve Goals: [place] Discharge plan needs to be updated       AM-PAC OT "6 Clicks" Daily Activity     Outcome Measure   Help from another person eating meals?: Total Help from another person taking care of personal grooming?: Total Help from another person toileting, which includes using toliet, bedpan, or urinal?: Total Help from another person bathing (including washing, rinsing, drying)?: Total Help from another person to put on and taking off regular upper body clothing?: Total Help from another person to put on and taking off regular lower body clothing?: Total 6 Click Score: 6    End of Session    OT Visit Diagnosis: Unsteadiness on feet (R26.81);Other abnormalities of gait and mobility (R26.89);Muscle weakness (generalized) (M62.81);Other symptoms and signs involving cognitive function;Pain Pain - Right/Left: Left Pain - part of body: Leg   Activity Tolerance Patient limited by lethargy   Patient Left in bed;with call bell/phone within reach   Nurse Communication Need for lift equipment        Time: 1049-1110 OT Time Calculation (min): 21 min  Charges: OT General Charges $  OT Visit: 1 Visit OT Treatments $Self Care/Home Management : 8-22 mins  Andres White OTR/L 09/04/2022, 12:07 PM

## 2022-09-04 NOTE — Progress Notes (Addendum)
Durable Medical Equipment  (From admission, onward)           Start     Ordered   09/04/22 1152  For home use only DME Hospital bed  Once       Comments: Needs Therapeutic Mattress and Hoyer lift  Question Answer Comment  Length of Need 12 Months   The above medical condition requires: Patient requires the ability to reposition frequently   Head must be elevated greater than: 30 degrees   Bed type Semi-electric   Hoyer Lift Yes   Support Surface: Gel Overlay      09/04/22 1153   09/02/22 0919  For home use only DME Walker rolling  Once       Question Answer Comment  Walker: With [place]di   Patient needs a walker to treat with the following condition Hx of TIA (transient ischemic attack) and stroke   Patient needs a walker to treat with the following condition Generalized weakness   Patient needs a walker to treat with the following condition Right sided weakness      09/02/22 0920

## 2022-09-04 NOTE — Care Management Important Message (Signed)
Important Message  Patient Details  Name: Andres White MRN: [id number] Date of Birth: 09-25-1945   Medicare Important Message Given:  Yes     [name] Montine Circle 09/04/2022, 3:41 PM

## 2022-09-05 ENCOUNTER — Telehealth: Payer: Self-pay | Admitting: *Deleted

## 2022-09-05 ENCOUNTER — Encounter: Payer: Medicare Other | Admitting: Physical Medicine and Rehabilitation

## 2022-09-05 ENCOUNTER — Encounter: Payer: Self-pay | Admitting: *Deleted

## 2022-09-05 NOTE — Patient Outreach (Signed)
Care Coordination [facility] Note Transition Care Management Follow-up Telephone Call Date of discharge and from where: Thursday, 09/04/22 Andres White; severe UTI sepsis in setting of prior CVA with debility How have you been since you were released from the hospital? Per daughter/ caregiver [name], verified on [facility] DPR: "Things are going overall well since he has been home.  I got the rolling walker and the wheelchair from the hospital; I talked to the people at [facility] today about the hospital bed and the hoyer lift-- they confirmed they will be bringing it tomorrow; so that is good.  Home Health has been ordered-- thanks for the phone number, I will call them-- I can't schedule a PCP or any other appointment today, because he is so much weaker than he was since he got this infection.  The hospital doctor told me to have the home health nurse draw his labs when she comes out; he did not tell me that I needed to take him to the doctor-- that is why he ordered the hospital bed and the hoyer lift.  I just can't schedule any appointments until I see if he gets strong enough to get out to the care so I can take him.  I will talk to the social worker and the nurse care manager you have scheduled to follow up on all of this.  I do all of his care here at home, he requires assistance with everything... my husband helps out with his care also" Any questions or concerns? Yes -- has not yet heard from home health agency: provided number to [facility] home health agency [phone]); daughter states she works from home and will call agency to follow up on home health orders from hospital discharge/ initiate home visits; provided my direct phone number should she have issues after calling agency herself; scheduled with RN CM Care Coordinator next week on 09/09/22 to ensure delivery of DME and that home health agency is active; facilitate scheduling of hospital follow up PCP office if indicated -- has not yet obtained/ picked  up antibiotic prescribed at time of hospital discharge: caregiver states she will pick up today -- daughter not sure of she will be able to provide transportation to provider appointments due to patient's decline in status with recent sepsis; confirmed LCSW active in patient's care-- care coordination outreach to both LCSW and RN CM Care Coordinator to inform of all above  Items Reviewed: Did the pt receive and understand the discharge instructions provided? Yes  thoroughly reviewed AVS discharge instructions with caregiver/ daughter- she verbalizes good understanding all all Medications obtained and verified? Yes  caregiver states she has not yet picked up antibiotic ordered after hospital discharge; verbalizes plans to do so today; education provided around importance of obtaining and starting this medication today--she verbalizes understanding/ agreement; full medication review completed-- no other concerns/ discrepancies identified; confirmed caregiver manages all aspects of medication administration Other? No  Any new allergies since your discharge? No  Dietary orders reviewed? Yes Do you have support at home? Yes  patient requires full assistance with all care activities-- daughter/ caregiver provides all care for patient at home  Home Care and Equipment/Supplies: Were home health services ordered? yes If so, what is the name of the agency? [place]  Has the agency set up a time to come to the patient's home? No- see interventions as above Were any new equipment or medical supplies ordered?  Yes: hospital bed; hoyer lift-- has not yet been delivered; confirmed to  be delivered tomorrow; confirmed caregiver has obtained rolling walker and wheelchair What is the name of the medical supply agency? [employer]- daughter confirms she has spoken with [employer] this morning to ensure delivery of needed DME as above, for tomorrow Were you able to get the supplies/equipment? As above-- hospital bed/ hoyer  lift to be delivered 09/06/22 per [employer] Do you have any questions related to the use of the equipment or supplies? No  Functional Questionnaire: (I = Independent and D = Dependent) ADLs: D  Bathing/Dressing- D  Meal Prep- D  Eating- D  Maintaining continence- D  Transferring/Ambulation- D  Managing Meds- D  Follow up appointments reviewed:  PCP Hospital f/u appt confirmed? No  Scheduled to see - on - @ - see above Specialist Hospital f/u appt confirmed? No  Scheduled to see - on - @ - see notes above Are transportation arrangements needed?  Per caregiver "I don't know yet-- it depends on how he recuperates form his illness and hospital visit" If their condition worsens, is the pt aware to call PCP or go to the Emergency Dept.? Yes Was the patient provided with contact information for the PCP's office or ED? No- declined; reports already has all contact information for care provider teams Was to pt encouraged to call back with questions or concerns? Yes provided my direct contact information should questions/ concerns/ needs arise prior to scheduled RN CM Care Coordinator and LCSW telephone visits   SDOH assessments and interventions completed:   Yes SDOH Interventions Today    Flowsheet Row Most Recent Value  SDOH Interventions   Food Insecurity Interventions Intervention Not Indicated  Transportation Interventions Intervention Not Indicated  [daughter reports she normally drives patient,  reports he is so weak after recent hospitalization she is not sure he is able to get to vehicle-- LCSW already incolved in patient's care,  will send message as FYI re: potential transportation needs]      Care Coordination Interventions:  Referred for Care Coordination Services:  [place] to established LCSW and newly scheduled RN CM as FYI around potential ongoing needs post-hospital discharge; provided education around importance of antibiotics in  setting of UTI; full medication review completed; provided education around importance of hospital follow up visits with PCP    Encounter Outcome:  Pt. Visit Completed    Oneta Rack, RN, BSN, CCRN Alumnus RN CM Care Coordination/ Transition of [place] Management [phone]: direct office

## 2022-09-06 DIAGNOSIS — M6281 Muscle weakness (generalized): Secondary | ICD-10-CM | POA: Diagnosis not present

## 2022-09-09 ENCOUNTER — Telehealth: Payer: Self-pay

## 2022-09-09 DIAGNOSIS — G9340 Encephalopathy, unspecified: Secondary | ICD-10-CM | POA: Diagnosis not present

## 2022-09-09 DIAGNOSIS — I69319 Unspecified symptoms and signs involving cognitive functions following cerebral infarction: Secondary | ICD-10-CM | POA: Diagnosis not present

## 2022-09-09 DIAGNOSIS — Z9181 History of falling: Secondary | ICD-10-CM | POA: Diagnosis not present

## 2022-09-09 DIAGNOSIS — M549 Dorsalgia, unspecified: Secondary | ICD-10-CM | POA: Diagnosis not present

## 2022-09-09 DIAGNOSIS — N390 Urinary tract infection, site not specified: Secondary | ICD-10-CM | POA: Diagnosis not present

## 2022-09-09 DIAGNOSIS — A419 Sepsis, unspecified organism: Secondary | ICD-10-CM | POA: Diagnosis not present

## 2022-09-09 DIAGNOSIS — G8929 Other chronic pain: Secondary | ICD-10-CM | POA: Diagnosis not present

## 2022-09-09 DIAGNOSIS — I130 Hypertensive heart and chronic kidney disease with heart failure and stage 1 through stage 4 chronic kidney disease, or unspecified chronic kidney disease: Secondary | ICD-10-CM | POA: Diagnosis not present

## 2022-09-09 DIAGNOSIS — M109 Gout, unspecified: Secondary | ICD-10-CM | POA: Diagnosis not present

## 2022-09-09 DIAGNOSIS — E876 Hypokalemia: Secondary | ICD-10-CM | POA: Diagnosis not present

## 2022-09-09 DIAGNOSIS — M1990 Unspecified osteoarthritis, unspecified site: Secondary | ICD-10-CM | POA: Diagnosis not present

## 2022-09-09 DIAGNOSIS — I5030 Unspecified diastolic (congestive) heart failure: Secondary | ICD-10-CM | POA: Diagnosis not present

## 2022-09-09 DIAGNOSIS — N1832 Chronic kidney disease, stage 3b: Secondary | ICD-10-CM | POA: Diagnosis not present

## 2022-09-09 DIAGNOSIS — I69398 Other sequelae of cerebral infarction: Secondary | ICD-10-CM | POA: Diagnosis not present

## 2022-09-09 DIAGNOSIS — I69351 Hemiplegia and hemiparesis following cerebral infarction affecting right dominant side: Secondary | ICD-10-CM | POA: Diagnosis not present

## 2022-09-09 DIAGNOSIS — D132 Benign neoplasm of duodenum: Secondary | ICD-10-CM | POA: Diagnosis not present

## 2022-09-09 DIAGNOSIS — Z7982 Long term (current) use of aspirin: Secondary | ICD-10-CM | POA: Diagnosis not present

## 2022-09-09 DIAGNOSIS — D630 Anemia in neoplastic disease: Secondary | ICD-10-CM | POA: Diagnosis not present

## 2022-09-09 NOTE — Patient Outreach (Signed)
  Care Coordination   09/09/2022 Name: Andres White MRN: [id number] DOB: 1946/07/19   Care Coordination Outreach Attempts:  An unsuccessful telephone outreach was attempted for a scheduled appointment today.  Follow Up Plan:  Additional outreach attempts will be made to offer the patient care coordination information and services.   Encounter Outcome:  No Answer   Care Coordination Interventions:  No, not indicated    Thea Silversmith, RN, MSN, BSN, [place] Coordinator [phone]

## 2022-09-10 ENCOUNTER — Ambulatory Visit: Payer: Medicare Other | Attending: Cardiovascular Disease

## 2022-09-10 ENCOUNTER — Telehealth: Payer: Self-pay | Admitting: Family

## 2022-09-10 DIAGNOSIS — I639 Cerebral infarction, unspecified: Secondary | ICD-10-CM | POA: Diagnosis not present

## 2022-09-10 DIAGNOSIS — R9431 Abnormal electrocardiogram [ECG] [EKG]: Secondary | ICD-10-CM | POA: Diagnosis not present

## 2022-09-10 DIAGNOSIS — R001 Bradycardia, unspecified: Secondary | ICD-10-CM | POA: Diagnosis not present

## 2022-09-10 NOTE — Telephone Encounter (Signed)
Let a VM stating orders and for any further questions to give our office a call back.

## 2022-09-10 NOTE — Telephone Encounter (Signed)
Caller/Agency: [facility] Number: [phone] - ok for vm Requesting OT/PT/Skilled Nursing/Social Work/Speech Therapy: Nursing Frequency: 1 x 6

## 2022-09-12 ENCOUNTER — Other Ambulatory Visit: Payer: Self-pay | Admitting: Physical Medicine and Rehabilitation

## 2022-09-12 ENCOUNTER — Other Ambulatory Visit: Payer: Self-pay | Admitting: Cardiovascular Disease

## 2022-09-12 ENCOUNTER — Other Ambulatory Visit: Payer: Medicare Other

## 2022-09-15 ENCOUNTER — Ambulatory Visit: Payer: Self-pay | Admitting: Licensed Clinical Social Worker

## 2022-09-15 NOTE — Patient Instructions (Signed)
Visit Information  Thank you for taking time to visit with me today. Please don't hesitate to contact me if I can be of assistance to you before our next scheduled telephone appointment.  Following are the goals we discussed today:    Our next appointment is by telephone on 10/21/22 at 1:00 PM   Please call the care guide team at [phone] if you need to cancel or reschedule your appointment.   If you are experiencing a Mental Health or [place] or need someone to talk to, please go to [facility] [place] [phone])   Following is a copy of your full plan of care:   Interventions:  LCSW spoke with [name], daughter of patient, today about patient needs Patient needs daily help with ADLs, ambulation, meals, transportation, medication compliance.  [name] is supportive of client. She also has some help from her spouse in caring for client needs Discussed ambulation of client. Client has difficulty standing . Client uses wheelchair to ambulate.  Discussed physical therapy support. Heather said physical therapist is scheduled to complete home visit today with client for physical therapy support Discussed medical support of client with Marrian Salvage, FNP Reviewed transport needs of client. [name] said she transports client to and from client medical appointments Reviewed medication procurement of client. [name] said client has prescribed medications. Reviewed pain issues of client . Reviewed sleeping issues of client Briefly discussed level of care needs. [name] said client is doing pretty well at home at present. She and her spouse are supportive of client needs.  .   Mr. Biderman was given information about Care Management services by the embedded care coordination team including:  Care Management services include personalized support from designated clinical staff supervised by his  physician, including individualized plan of care and coordination with other care providers 24/7 contact phone numbers for assistance for urgent and routine care needs. The patient may stop CCM services at any time (effective at the end of the month) by phone call to the office staff.  Patient agreed to services and verbal consent obtained.    Norva Riffle.Zaion Hreha MSW, [place] representative [facility] Care Management [phone]

## 2022-09-15 NOTE — Patient Outreach (Signed)
  Care Coordination   Follow Up Visit Note   09/15/2022 Name: Andres White MRN: [id number] DOB: 1946-01-01  Andres White is a 77 y.o. year old male who sees Valere Dross, Marvis Repress, [place] for primary care. I spoke with Andres White, daughter of patient  by phone today.  What matters to the patients health and wellness today? Patient needs help with daily ADLs and with ambulation. Daughter and son in law are supportive of patient.     Goals Addressed             This Visit's Progress    Patient needs daily help with ADLs and ambulation. Daughter, Andres White is supportive       Interventions:  LCSW spoke with Andres White, daughter of patient, today about patient needs Patient needs daily help with ADLs, ambulation, meals, transportation, medication compliance.  Andres White is supportive of client. She also has some help from her spouse in caring for client needs Discussed ambulation of client. Client has difficulty standing . Client uses wheelchair to ambulate.  Discussed physical therapy support. Heather said physical therapist is scheduled to complete home visit today with client for physical therapy support Discussed medical support of client with Marrian Salvage, FNP Reviewed transport needs of client. Andres White said she transports client to and from client medical appointments Reviewed medication procurement of client. [name] said client has prescribed medications. Reviewed pain issues of client . Reviewed sleeping issues of client Briefly discussed level of care needs. [name] said client is doing pretty well at home at present. She and her spouse are supportive of client needs.  [name]          SDOH assessments and interventions completed:  Yes  SDOH Interventions Today    Flowsheet Row Most Recent Value  SDOH Interventions   Physical Activity Interventions Other (Comments)  [client uses a walker or a wheelchair as needed to help him ambulate]  Stress  Interventions Other (Comment)  [client has stress related to managing medical needs]        Care Coordination Interventions:  Yes, provided  Interventions Today    Flowsheet Row Most Recent Value  Chronic Disease Discussed/Reviewed   Chronic disease discussed/reviewed during today's visit Other  General Interventions   General Interventions Discussed/Reviewed Durable Medical Equipment (DME), General Interventions Discussed  [client has walker, wheelchair, hospital bed]  Durable Medical Equipment (DME) Gilford Rile, Community education officer  Education Interventions   Education Provided Provided Verbal Education  Mental Health Interventions   Mental Health Discussed/Reviewed Coping Strategies       Follow up plan: Follow up call scheduled for 10/21/22 at 1:00 PM     Encounter Outcome:  Pt. Visit Completed   Norva Riffle.Shloma Roggenkamp MSW, [place] [facility] representative [facility] Care Management [phone]

## 2022-09-18 ENCOUNTER — Ambulatory Visit: Payer: Self-pay

## 2022-09-18 DIAGNOSIS — I130 Hypertensive heart and chronic kidney disease with heart failure and stage 1 through stage 4 chronic kidney disease, or unspecified chronic kidney disease: Secondary | ICD-10-CM | POA: Diagnosis not present

## 2022-09-18 DIAGNOSIS — D132 Benign neoplasm of duodenum: Secondary | ICD-10-CM | POA: Diagnosis not present

## 2022-09-18 DIAGNOSIS — E876 Hypokalemia: Secondary | ICD-10-CM | POA: Diagnosis not present

## 2022-09-18 DIAGNOSIS — M109 Gout, unspecified: Secondary | ICD-10-CM | POA: Diagnosis not present

## 2022-09-18 DIAGNOSIS — G8929 Other chronic pain: Secondary | ICD-10-CM | POA: Diagnosis not present

## 2022-09-18 DIAGNOSIS — I69351 Hemiplegia and hemiparesis following cerebral infarction affecting right dominant side: Secondary | ICD-10-CM | POA: Diagnosis not present

## 2022-09-18 DIAGNOSIS — M1990 Unspecified osteoarthritis, unspecified site: Secondary | ICD-10-CM | POA: Diagnosis not present

## 2022-09-18 DIAGNOSIS — I5030 Unspecified diastolic (congestive) heart failure: Secondary | ICD-10-CM | POA: Diagnosis not present

## 2022-09-18 DIAGNOSIS — D630 Anemia in neoplastic disease: Secondary | ICD-10-CM | POA: Diagnosis not present

## 2022-09-18 DIAGNOSIS — Z7982 Long term (current) use of aspirin: Secondary | ICD-10-CM | POA: Diagnosis not present

## 2022-09-18 DIAGNOSIS — A419 Sepsis, unspecified organism: Secondary | ICD-10-CM | POA: Diagnosis not present

## 2022-09-18 DIAGNOSIS — I69398 Other sequelae of cerebral infarction: Secondary | ICD-10-CM | POA: Diagnosis not present

## 2022-09-18 DIAGNOSIS — N1832 Chronic kidney disease, stage 3b: Secondary | ICD-10-CM | POA: Diagnosis not present

## 2022-09-18 DIAGNOSIS — M549 Dorsalgia, unspecified: Secondary | ICD-10-CM | POA: Diagnosis not present

## 2022-09-18 DIAGNOSIS — Z9181 History of falling: Secondary | ICD-10-CM | POA: Diagnosis not present

## 2022-09-18 DIAGNOSIS — N390 Urinary tract infection, site not specified: Secondary | ICD-10-CM | POA: Diagnosis not present

## 2022-09-18 DIAGNOSIS — I69319 Unspecified symptoms and signs involving cognitive functions following cerebral infarction: Secondary | ICD-10-CM | POA: Diagnosis not present

## 2022-09-18 DIAGNOSIS — G9340 Encephalopathy, unspecified: Secondary | ICD-10-CM | POA: Diagnosis not present

## 2022-09-18 NOTE — Patient Instructions (Signed)
Visit Information  Thank you for taking time to visit with me today. Please don't hesitate to contact me if I can be of assistance to you.   Following are the goals we discussed today:   Goals Addressed             This Visit's Progress    Patient Stated       Interventions Today    Flowsheet Row Most Recent Value  Chronic Disease   Chronic disease during today's visit Other  [recent hospitalization: ARF/CKD/UTI]  General Interventions   General Interventions Discussed/Reviewed Durable Medical Equipment (DME), Doctor Visits  Doctor Visits Discussed/Reviewed Doctor Visits Discussed  [daughter reports unable to get patient to provider visits. discussed [facility] and daughter request referral to [facility].]  Durable Medical Equipment (DME) Wheelchair, Walker, Oxygen  [called [facility]-informed by [employer] that they do not have orders for oxygen. patient received wheelchair/rolling walker, hoyer lift and hospital bed.]  Exercise Interventions   Exercise Discussed/Reviewed Exercise Discussed, Assistive device use and maintanence  [confirmed home health PT/nursing involved.]  Education Interventions   Education Provided Provided Education  Provided Verbal Education On Other  [educated on process for qualifying for oxygen. adivsed to discuss with HHPT at tomorrow's visit to assess for oxygen needs. process for obtaining a motorized wheelchair]  Nutrition Interventions   Nutrition Discussed/Reviewed Nutrition Discussed  [discussed patient's nutrition with daughter. encouraged healthy eating. may need smaller more frequent feedings.]  Pharmacy Interventions   Pharmacy Dicussed/Reviewed Pharmacy Topics Discussed  [medications reviewed with daughter]  Safety Interventions   Safety Discussed/Reviewed Safety Discussed  [encouraged to follow recommendations provided by HHPT regarding transfering from bed to chair and back.]           Our next appointment is by telephone on  10/02/22 at 1:00 pm  Please call the care guide team at [phone] if you need to cancel or reschedule your appointment.   If you are experiencing a Mental Health or [place] or need someone to talk to, please call the Suicide and Crisis Lifeline: [place], RN, MSN, BSN, [place] [phone]

## 2022-09-18 NOTE — Patient Outreach (Signed)
  Care Coordination   Initial Visit Note   09/18/2022 Name: Andres White MRN: [id number] DOB: 27-Aug-1945  Andres White is a 77 y.o. year old male who sees Valere Dross, Marvis Repress, [place] for primary care. I spoke with daughter Andres White by phone today.  What matters to the patients health and wellness today?  Andres White reports patientis doing ok. Patient active with [facility] PT/Nursing. Per review of discharge instructions DME [facility]; wheelchair and RW. Daughter reports patient does not have oxygen, but has other equipment. Daughter to discuss with home health PT scheduled to make home visit tomorrow.   Goals Addressed             This Visit's Progress    Patient Stated       Interventions Today    Flowsheet Row Most Recent Value  Chronic Disease   Chronic disease during today's visit Other  [recent hospitalization: ARF/CKD/UTI]  General Interventions   General Interventions Discussed/Reviewed Durable Medical Equipment (DME), Doctor Visits  Doctor Visits Discussed/Reviewed Doctor Visits Discussed  [daughter reports unable to get patient to provider visits. discussed [facility] and daughter request referral to [facility].]  Durable Medical Equipment (DME) Wheelchair, Walker, Oxygen  [called [facility]-informed by [employer] that they do not have orders for oxygen. patient received wheelchair/rolling walker, hoyer lift and hospital bed.]  Exercise Interventions   Exercise Discussed/Reviewed Exercise Discussed, Assistive device use and maintanence  [confirmed home health PT/nursing involved.]  Education Interventions   Education Provided Provided Education  Provided Verbal Education On Other  [educated on process for qualifying for oxygen. adivsed to discuss with HHPT at tomorrow's visit to assess for oxygen needs. process for obtaining a motorized wheelchair]  Nutrition Interventions   Nutrition Discussed/Reviewed Nutrition Discussed  [discussed patient's  nutrition with daughter. encouraged healthy eating. may need smaller more frequent feedings.]  Pharmacy Interventions   Pharmacy Dicussed/Reviewed Pharmacy Topics Discussed  [medications reviewed with daughter]  Safety Interventions   Safety Discussed/Reviewed Safety Discussed  [encouraged to follow recommendations provided by HHPT regarding transfering from bed to chair and back.]           SDOH assessments and interventions completed:  Yes recently completed. No changes  Care Coordination Interventions:  Yes, provided   Follow up plan: Follow up call scheduled for 10/02/22    Encounter Outcome:  Pt. Visit Completed   Thea Silversmith, RN, MSN, BSN, [place] Coordinator [phone]

## 2022-09-19 ENCOUNTER — Telehealth: Payer: Self-pay

## 2022-09-19 NOTE — Patient Outreach (Signed)
  Care Coordination   Care Coordination  Visit Note   09/19/2022 Name: Andres White MRN: [id number] DOB: 1945-10-22  Andres White is a 77 y.o. year old male who sees Valere Dross, Marvis Repress, [place] for primary care.   What matters to the patients health and wellness today?  Daughter reports she is unable to get patient to provider appointments and is interested in a provider that makes home visits.    Goals Addressed             This Visit's Progress    Patient Stated       Interventions Today    Flowsheet Row Most Recent Value  General Interventions   General Interventions Discussed/Reviewed Communication with  [communication with PCP regarding [facility]]           SDOH assessments and interventions completed:  No     Care Coordination Interventions:  Yes, provided   Follow up plan:  as previously scheduled    Encounter Outcome:  Pt. Visit Completed   Thea Silversmith, RN, MSN, BSN, [place] Coordinator [phone]

## 2022-09-23 ENCOUNTER — Encounter: Payer: Medicare Other | Admitting: Physical Medicine and Rehabilitation

## 2022-09-24 ENCOUNTER — Telehealth: Payer: Self-pay

## 2022-09-24 NOTE — Patient Outreach (Signed)
  Care Coordination   Follow Up Visit Note   09/24/2022 Name: Andres White MRN: [id number] DOB: 1946-07-23  Andres White is a 77 y.o. year old male who sees Valere Dross, Marvis Repress, [place] for primary care.  What matters to the patients health and wellness today?  Referral completed for [place]             This Visit's Progress    Patient Stated: Community Resource needss       Interventions Today    Flowsheet Row Most Recent Value  General Interventions   General Interventions Discussed/Reviewed [employer] referral form completed and faxed.]           SDOH assessments and interventions completed:  No  Care Coordination Interventions:  Yes, provided   Follow up plan:  as previously scheduled    Encounter Outcome:  Pt. Visit Completed   Thea Silversmith, RN, MSN, BSN, [place] Coordinator [phone]

## 2022-09-25 IMAGING — CT CT ANGIO NECK
2 of 8 series · 8 of 33 positions shown · IV contrast (OMNI 350)
Comparison: Head CT same day.

CLINICAL DATA: Left leg weakness.  Speech disturbance.



[Series 6: cta neck · axial · 0.52mm/px · z∈[-248,-76]mm · 3 of 172 slices shown]
[im 43/172  soft-tissue]
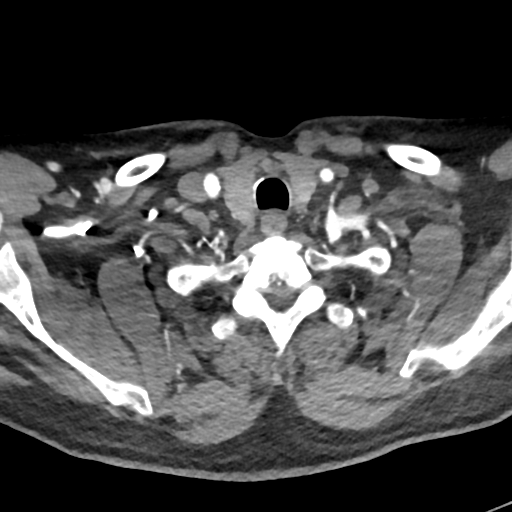
[im 86/172  soft-tissue]
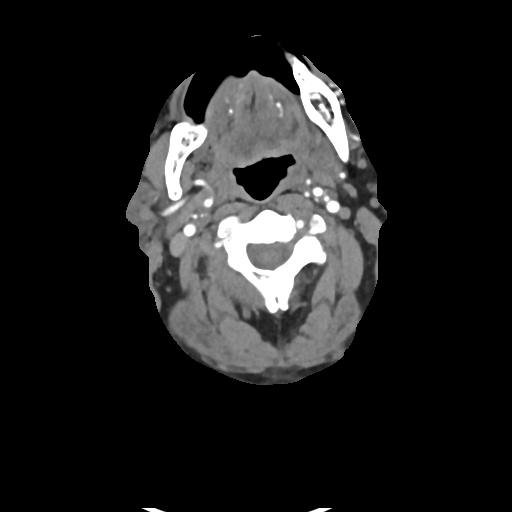
[im 129/172  soft-tissue]
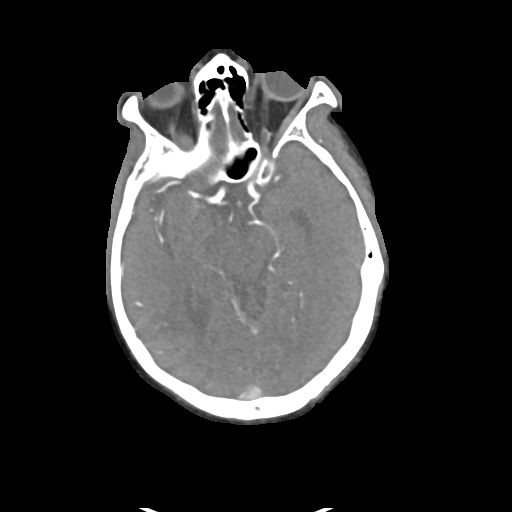

[Series 8: cta neck axial · axial · 0.39mm/px · z∈[-274,-46]mm · 5 of 343 slices shown]
[im 58/343  soft-tissue]
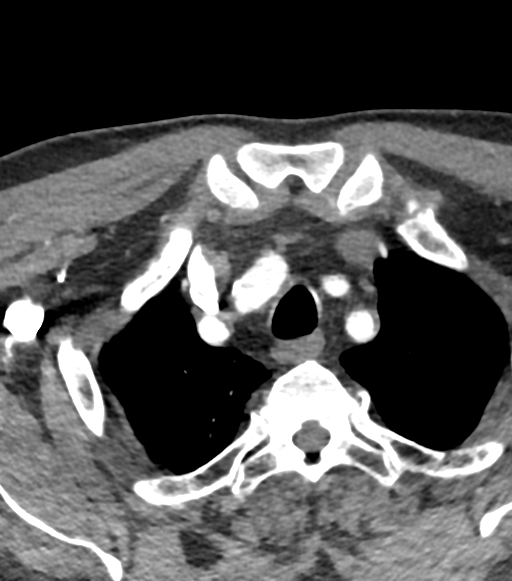
[im 115/343  bone]
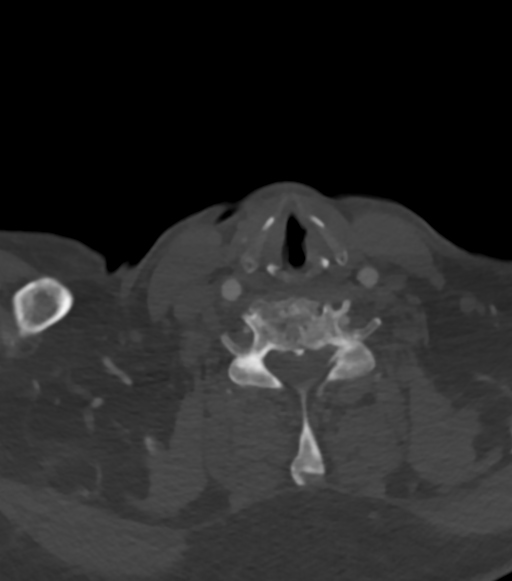
[im 172/343  soft-tissue]
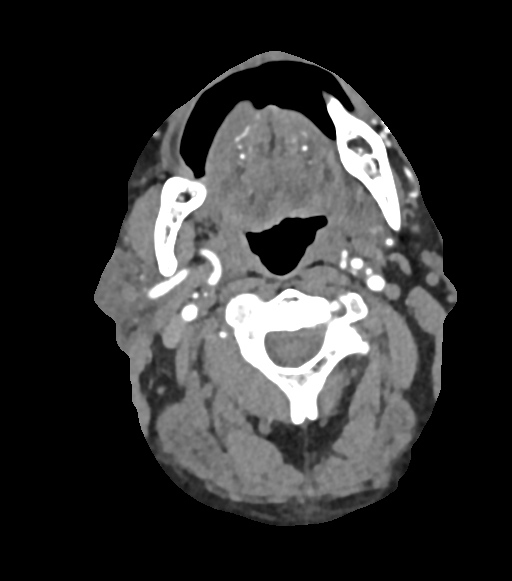
[im 229/343  bone]
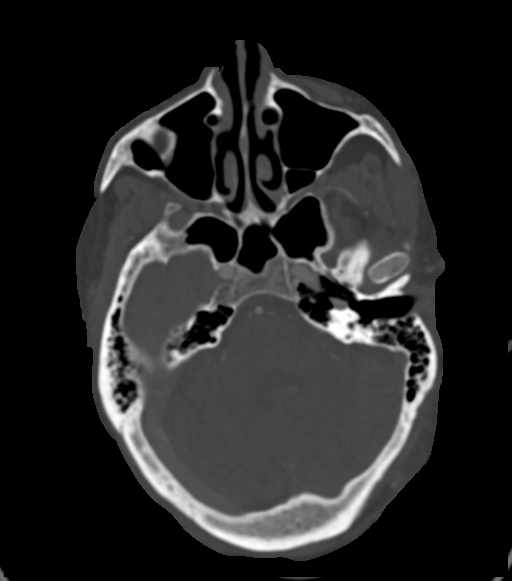
[im 286/343  soft-tissue]
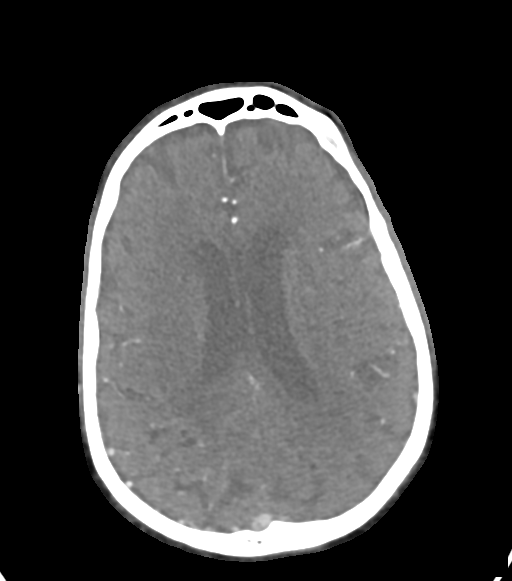

[8 of 33 positions shown; findings below may reference images not displayed]

FINDINGS: CTA NECK FINDINGS

Aortic arch: Aortic atherosclerotic calcification. Branching pattern
is normal without origin stenosis.

Right carotid system: Common carotid artery shows some calcified
plaque but no stenosis. Pronounced calcified plaque present at the
carotid bifurcation and ICA bulb. Minimal diameter in the ICA bulb
measures 4 mm. Compared to a more distal cervical ICA diameter of 5
mm, this indicates a 20% stenosis.

Left carotid system: Common carotid artery widely patent to the
bifurcation. Soft and calcified plaque at the carotid bifurcation
and ICA bulb. Minimal diameter of the proximal ICA is 1.5 mm.
Compared to a more distal cervical ICA diameter of 5 mm, this
indicates a 70% stenosis.

Vertebral arteries: Calcified plaque at the proximal non dominant
right vertebral artery with 50% stenosis at the origin. Extensive
calcified plaque at the proximal dominant left vertebral artery with
severe stenosis, possibly 90%. Both vessels are patent beyond the
proximal stenoses to the foramen magnum.

Skeleton: Advanced chronic cervical spondylosis. Chronic fusion at
the C5-6 level. Potential for significant canal stenosis
particularly at C4-5 and C5-6.

Other neck: No mass or lymphadenopathy.

Upper chest: Hazy density in the posterior right upper lobe that
could indicate bronchopneumonia.

Review of the MIP images confirms the above findings

CTA HEAD FINDINGS

Anterior circulation: Both internal carotid arteries are patent
through the skull base and siphon regions. There is siphon
atherosclerotic calcification but no stenosis greater than 30%. The
anterior and middle cerebral vessels are patent. No large or medium
vessel occlusion.

Posterior circulation: Both vertebral arteries are patent through
the foramen magnum to the basilar. No basilar stenosis. Posterior
circulation branch vessels are patent. Bilateral posterior
communicating arteries are present.

Venous sinuses: Patent and normal.

Anatomic variants: None significant.

Review of the MIP images confirms the above findings
IMPRESSION: 1. Aortic atherosclerosis.
2. Calcified plaque at both carotid bifurcations and ICA bulbs. 20%
stenosis of the proximal ICA on the right. 70% stenosis of the
proximal ICA on the left.
3. 50% stenosis at the origin of the non dominant right vertebral
artery. Extensive calcified plaque at the proximal dominant left
vertebral artery with severe stenosis, possibly 90%. Both vessels
are patent beyond the proximal stenoses to the basilar.
4. No intracranial large or medium vessel occlusion or correctable
proximal stenosis.
5. Hazy density in the posterior right upper lobe that could
indicate bronchopneumonia.
6. Advanced chronic cervical spondylosis with potential for
significant canal stenosis particularly at C4-5 and C5-6.

Aortic Atherosclerosis ([id number]).

## 2022-09-26 ENCOUNTER — Ambulatory Visit: Payer: Medicare Other | Admitting: Physical Medicine and Rehabilitation

## 2022-09-26 ENCOUNTER — Other Ambulatory Visit: Payer: Self-pay | Admitting: *Deleted

## 2022-09-26 ENCOUNTER — Telehealth: Payer: Self-pay | Admitting: Cardiovascular Disease

## 2022-09-26 IMAGING — DX DG CHEST 1V PORT
1 series · 1 of 1 positions shown · non-contrast
Comparison: September 16, 2020

CLINICAL DATA: Hypoxia

EXAM:
PORTABLE CHEST 1 VIEW

[chest ap]
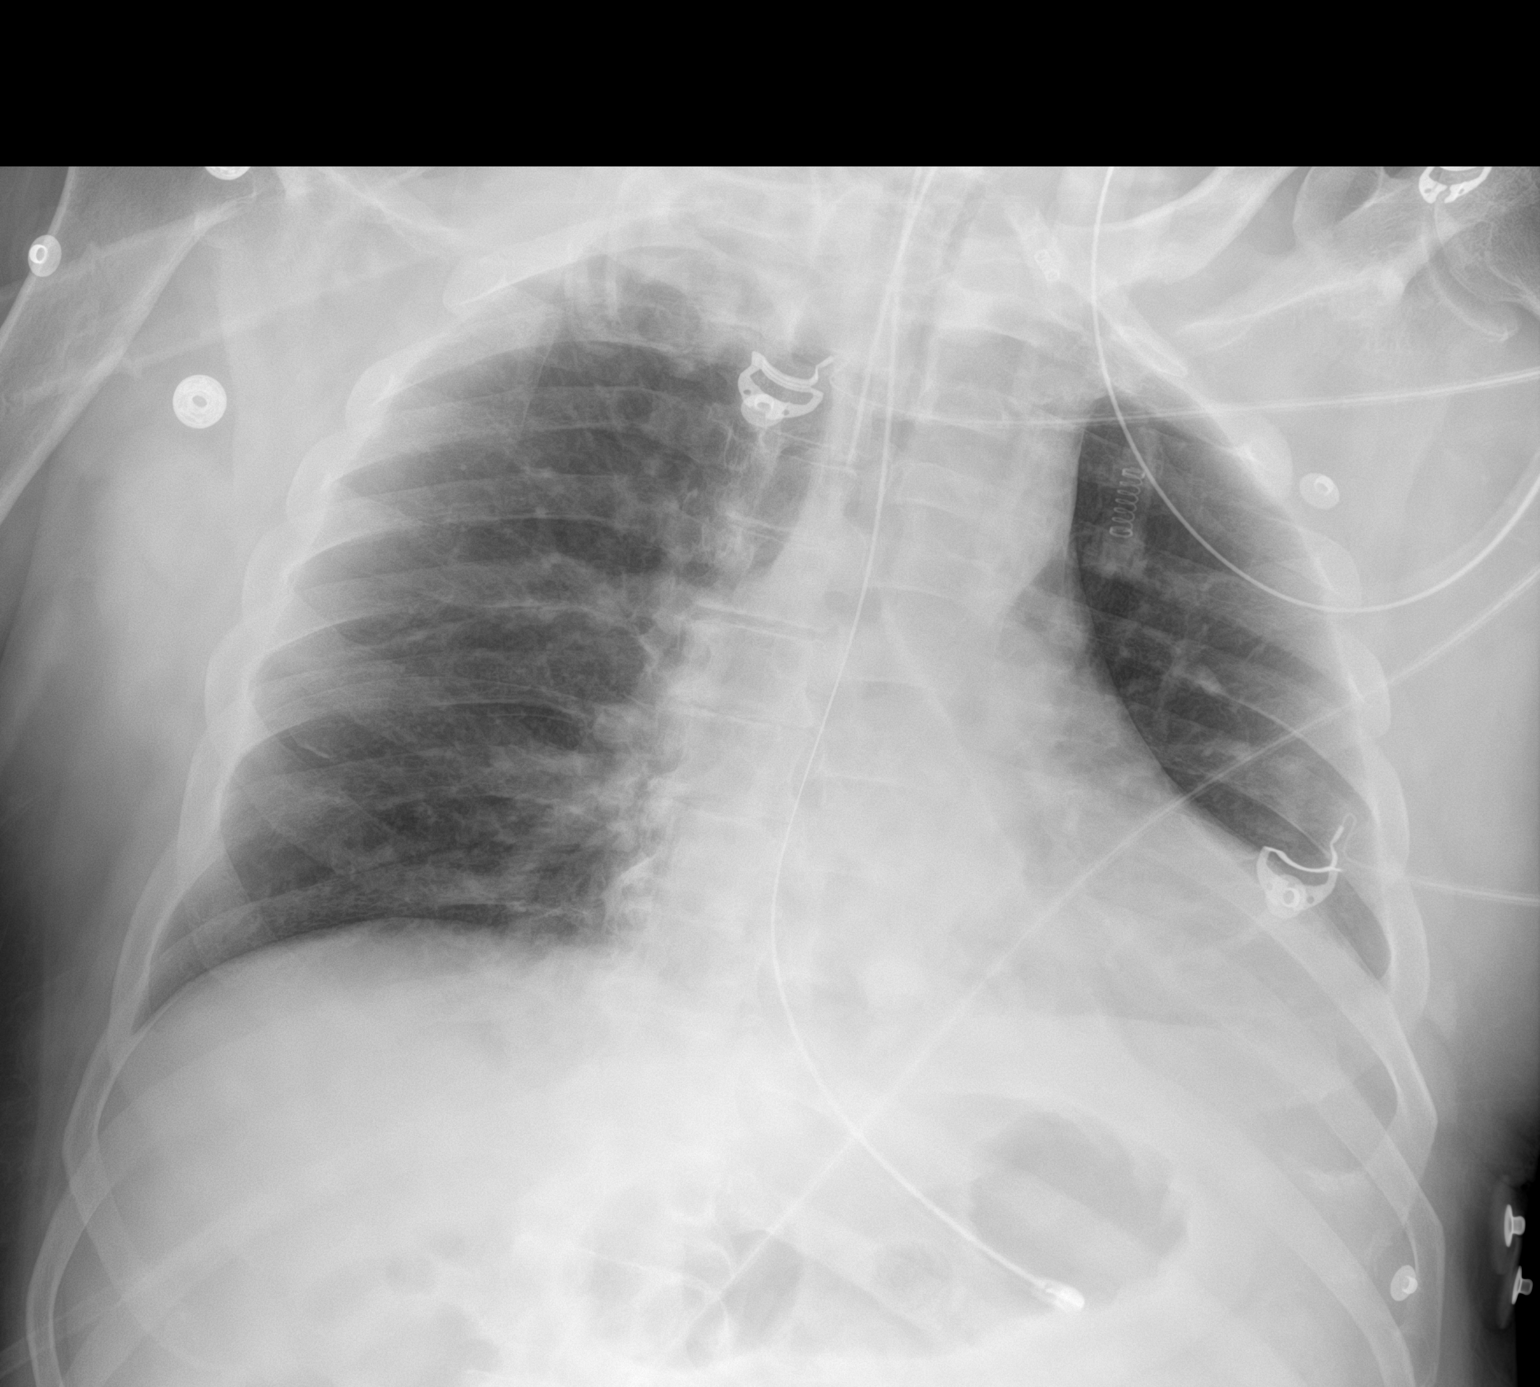

[1 of 1 positions shown; findings below may reference images not displayed]

FINDINGS: Endotracheal tube tip is 2.9 cm above the carina. Nasogastric tube
tip and side port are in the stomach. There is no appreciable
pneumothorax. There is stable atelectatic change in bases. There is
equivocal left pleural effusion. Heart size and pulmonary
vascularity are normal. No adenopathy. No bone lesions.
IMPRESSION: Tube positions as described without pneumothorax. Equivocal left
pleural effusion mild bibasilar atelectasis. No edema or airspace
opacity. Stable cardiac silhouette.

## 2022-09-26 NOTE — Patient Outreach (Signed)
  Care Coordination   09/26/2022  Name: Andres White MRN: [id number] DOB: [birth year]   Care Coordination Outreach Attempts:  An unsuccessful telephone outreach was attempted today to offer the patient information about available care coordination services as a benefit of their health plan. HIPAA compliant message left on voicemail, providing contact information for CSW, encouraging patient to return CSW's call at his earliest convenience.  Follow Up Plan:  Additional outreach attempts will be made to offer the patient care coordination information and services.   Encounter Outcome:  No Answer.   Care Coordination Interventions:  No, not indicated.     Nat Christen, BSW, MSW, LCSW  Licensed Education officer, [facility]  Mailing [place] [address] [place], [place] [address] Physical Address-[address] [place], [place] [address] Toll Free Main # [phone] Fax # [phone] Cell # [phone] [email or web address]

## 2022-09-26 NOTE — Telephone Encounter (Signed)
Calling with critical EKG

## 2022-09-26 NOTE — Telephone Encounter (Signed)
Received call from [facility] with [employer] who reported critical monitor results from 09/23/22 (SR with runs ov nonsustained v tach. Bigeminal PACs/PVCs) Spoke with patient who doe not remember having any symptoms on that day or now. Showed Dr. Audie Box the strip. No new orders.

## 2022-09-28 IMAGING — DX DG CHEST 1V PORT
1 series · 1 of 1 positions shown · non-contrast
Comparison: 09/17/2020.

CLINICAL DATA: Wrist for aspiration.

EXAM:
PORTABLE CHEST 1 VIEW

[chest ap]
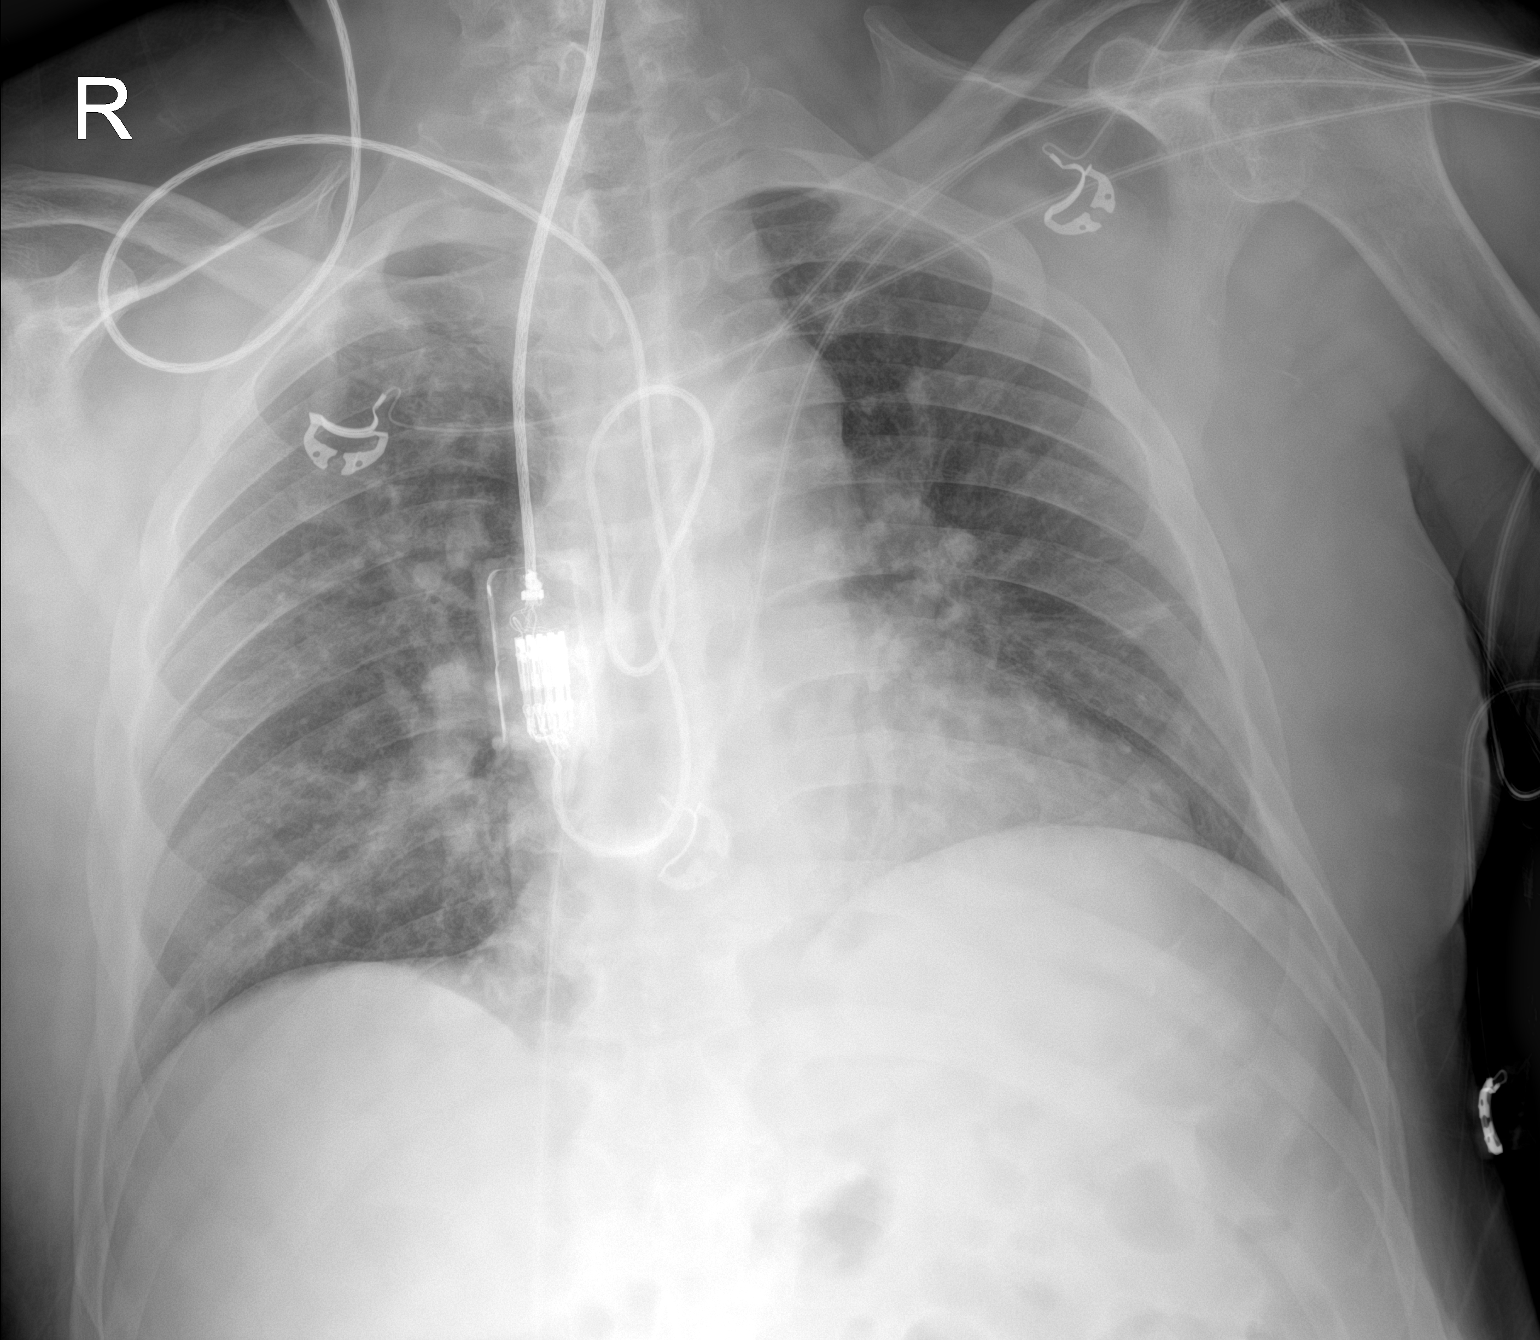

[1 of 1 positions shown; findings below may reference images not displayed]

FINDINGS: Endotracheal tube and feeding tube have been removed. Borderline
cardiomegaly with pulmonary venous congestion. Mild bilateral
interstitial prominence. Mild component CHF cannot be excluded.
Pneumonitis cannot be excluded. Low lung volumes with mild left mid
lung subsegmental atelectasis.
IMPRESSION: 1. Interval removal of endotracheal tube and feeding tube.
2. Borderline cardiomegaly with pulmonary venous congestion. Mild
bilateral interstitial prominence. A mild component of CHF cannot be
excluded. Pneumonitis cannot be excluded.
3. Cm.  Mild left mid lung field subsegmental atelectasis.

## 2022-10-02 ENCOUNTER — Ambulatory Visit: Payer: Self-pay

## 2022-10-02 ENCOUNTER — Telehealth: Payer: Self-pay | Admitting: Family

## 2022-10-02 NOTE — Telephone Encounter (Signed)
Brittney ([facility])  called stating that she had called the pt's daughter to set up a visit and the daughter stated that the pt no longer wanted to have visits through [facility] due to the timeliness of his [facility] PT eval.

## 2022-10-02 NOTE — Patient Instructions (Signed)
Visit Information  Thank you for taking time to visit with me today. Please don't hesitate to contact me if I can be of assistance to you.   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       Interventions Today    Flowsheet Row Most Recent Value  Chronic Disease   Chronic disease during today's visit Other  [recent hospitalization: ARF/CKD/UTI]  General Interventions   General Interventions Discussed/Reviewed Communication with  Doctor Visits Discussed/Reviewed Doctor Visits Discussed  Communication with --  Merchandiser, retail and [facility] equity]  Education Interventions   Education Provided Provided Education  Provided Verbal Education On --  [facility] encouraged patient to participate in excercise recommended by therapist to maintain muscle strength and tone]           Our next appointment is by telephone on 10/16/22 at 1:30 pm  Please call the care guide team at [phone] if you need to cancel or reschedule your appointment.   If you are experiencing a Mental Health or [place] or need someone to talk to, please call the Suicide and Crisis Lifeline: [place], RN, MSN, BSN, [place] [phone]

## 2022-10-02 NOTE — Patient Outreach (Signed)
  Care Coordination   Follow Up Visit Note   10/02/2022 Name: MARCIA RODAS MRN: [id number] DOB: September 05, 1945  [name] is a 77 y.o. year old male who sees Valere Dross, Marvis Repress, [place] for primary care. I spoke with  [name] by phone today.  What matters to the patients health and wellness today?  RNCM received message from Inez Pilgrim that daughter declines [facility] at this time. Daughter reports she had concerns regarding specialist appointments and is unclear how [facility] manages specialist. She would would like to speak with [facility] again. She expresses concerns that nurse called to schedule a time to come out the day of the call vs calling to arrange a next day appointment and initially. But states she contacted [place] and someone is supossed to come out next Tuesday and physical therapist next Wednesday or Thursday. Mr. Hockenbury states he is doing alright. He denies any questions or concerns. Per [place] a nurse will be out tomorrow and physical therapy by next week. Patient's daughter notified.  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       Interventions Today    Flowsheet Row Most Recent Value  Chronic Disease   Chronic disease during today's visit Other  [recent hospitalization: ARF/CKD/UTI]  General Interventions   General Interventions Discussed/Reviewed Communication with  Doctor Visits Discussed/Reviewed Doctor Visits Discussed  Communication with --  Merchandiser, retail and Health equity]  Education Interventions   Education Provided Provided Education  Provided Verbal Education On --  [facility] encouraged patient to participate in excercise recommended by therapist to maintain muscle strength and tone]           SDOH assessments and interventions completed:  No  Care Coordination Interventions:  Yes, provided   Follow up plan: Follow up call scheduled for 10/16/22    Encounter Outcome:   Pt. Visit Completed   Thea Silversmith, RN, MSN, BSN, [place] Coordinator [phone]

## 2022-10-03 ENCOUNTER — Encounter
Payer: Medicare Other | Attending: Physical Medicine and Rehabilitation | Admitting: Physical Medicine and Rehabilitation

## 2022-10-03 VITALS — BP 111/57 | HR 68

## 2022-10-03 DIAGNOSIS — G894 Chronic pain syndrome: Secondary | ICD-10-CM | POA: Diagnosis not present

## 2022-10-03 DIAGNOSIS — I69398 Other sequelae of cerebral infarction: Secondary | ICD-10-CM | POA: Diagnosis not present

## 2022-10-03 DIAGNOSIS — M1990 Unspecified osteoarthritis, unspecified site: Secondary | ICD-10-CM | POA: Diagnosis not present

## 2022-10-03 DIAGNOSIS — Z79891 Long term (current) use of opiate analgesic: Secondary | ICD-10-CM | POA: Insufficient documentation

## 2022-10-03 DIAGNOSIS — G8929 Other chronic pain: Secondary | ICD-10-CM | POA: Diagnosis not present

## 2022-10-03 DIAGNOSIS — R29898 Other symptoms and signs involving the musculoskeletal system: Secondary | ICD-10-CM | POA: Insufficient documentation

## 2022-10-03 DIAGNOSIS — Z7982 Long term (current) use of aspirin: Secondary | ICD-10-CM | POA: Diagnosis not present

## 2022-10-03 DIAGNOSIS — E876 Hypokalemia: Secondary | ICD-10-CM | POA: Diagnosis not present

## 2022-10-03 DIAGNOSIS — I69351 Hemiplegia and hemiparesis following cerebral infarction affecting right dominant side: Secondary | ICD-10-CM | POA: Diagnosis not present

## 2022-10-03 DIAGNOSIS — D132 Benign neoplasm of duodenum: Secondary | ICD-10-CM | POA: Diagnosis not present

## 2022-10-03 DIAGNOSIS — Z5181 Encounter for therapeutic drug level monitoring: Secondary | ICD-10-CM | POA: Diagnosis not present

## 2022-10-03 DIAGNOSIS — M109 Gout, unspecified: Secondary | ICD-10-CM | POA: Diagnosis not present

## 2022-10-03 DIAGNOSIS — N1832 Chronic kidney disease, stage 3b: Secondary | ICD-10-CM | POA: Diagnosis not present

## 2022-10-03 DIAGNOSIS — I739 Peripheral vascular disease, unspecified: Secondary | ICD-10-CM | POA: Diagnosis not present

## 2022-10-03 DIAGNOSIS — A419 Sepsis, unspecified organism: Secondary | ICD-10-CM | POA: Diagnosis not present

## 2022-10-03 DIAGNOSIS — D630 Anemia in neoplastic disease: Secondary | ICD-10-CM | POA: Diagnosis not present

## 2022-10-03 DIAGNOSIS — I5030 Unspecified diastolic (congestive) heart failure: Secondary | ICD-10-CM | POA: Diagnosis not present

## 2022-10-03 DIAGNOSIS — I130 Hypertensive heart and chronic kidney disease with heart failure and stage 1 through stage 4 chronic kidney disease, or unspecified chronic kidney disease: Secondary | ICD-10-CM | POA: Diagnosis not present

## 2022-10-03 DIAGNOSIS — G9340 Encephalopathy, unspecified: Secondary | ICD-10-CM | POA: Diagnosis not present

## 2022-10-03 DIAGNOSIS — M549 Dorsalgia, unspecified: Secondary | ICD-10-CM | POA: Diagnosis not present

## 2022-10-03 DIAGNOSIS — N390 Urinary tract infection, site not specified: Secondary | ICD-10-CM | POA: Diagnosis not present

## 2022-10-03 DIAGNOSIS — I69319 Unspecified symptoms and signs involving cognitive functions following cerebral infarction: Secondary | ICD-10-CM | POA: Diagnosis not present

## 2022-10-03 DIAGNOSIS — Z9181 History of falling: Secondary | ICD-10-CM | POA: Diagnosis not present

## 2022-10-03 MED ORDER — ALLOPURINOL 100 MG PO TABS: 100.0000 mg | ORAL_TABLET | Freq: Every day | ORAL | 3 refills | Status: AC

## 2022-10-03 MED ORDER — TRAMADOL HCL 50 MG PO TABS: 50.0000 mg | ORAL_TABLET | Freq: Two times a day (BID) | ORAL | 5 refills | Status: AC | PRN

## 2022-10-03 NOTE — Addendum Note (Signed)
Addended by: Jasmine December T on: 10/03/2022 10:45 AM   Modules accepted: Orders

## 2022-10-03 NOTE — Progress Notes (Addendum)
Subjective:    Patient ID: Andres White, male    DOB: May 22, 1946, 77 y.o.   MRN: [id number]  HPI: Andres White is a 77 year old man who presents to for follow-up of right knee and lower leg pain. Started 3-4 months ago. Has had decreased sensation in right foot. Last visit was with vascular last year and their visits are once a year.   1) Acute brainstem infarct, sequelae: - Andres White is a 77 y.o. male who is here for f/u appointment of his Brainstem Infarct acute, Right Leg weakness, occult blood in stools,  and left carotid stenosis. Andres White went to [facility] on 06/24/2020 for right leg weakness. Neurology consulted.   -CT Head WO Contrast:  IMPRESSION: No acute intracranial hemorrhage or mass effect. Multiple chronic infarcts and moderate chronic microvascular ischemic changes. Age indeterminate though probably chronic infarct of the left thalamus. MR Brain WO Contrast:  IMPRESSION: Small acute infarcts of the left midbrain.   Several chronic infarcts as described. Moderate chronic microvascular ischemic changes.  US Carotid Bilateral:  IMPRESSION: Right:   Color duplex indicates moderate heterogeneous and calcified plaque, with no hemodynamically significant stenosis by duplex criteria in the extracranial cerebrovascular circulation.   Left:   Heterogeneous and partially calcified plaque at the left carotid bifurcation, with discordant results regarding degree of stenosis by established duplex criteria. Peak velocity suggests 70% - 99% stenosis, with the ICA/ CCA ratio suggesting a lesser degree of stenosis. If establishing a more accurate degree of stenosis is required, cerebral angiogram should be considered, or as a second best test, CTA. Per Discharge Summary: Reesa Chew PA-C: Neurology recommended aspirin and statin for secondary stroke prevention.  Cardiology was consulted , he had bouts of asymptomatic NSVT, cardiology following as outpatient.   Andres White was admitted to inpatient rehabilitation on 06/27/2020 and he was discharged to daughter's home on 07/18/2020. He's receiving outpatient therapy at [facility]. He denies any pain at this time. He rated his pain on his Health and History 3. Also reports he has a good appetite.  Daughter in room, all questions answered.  Andres White daughter has established PCP care for him in [place].   Current mobility: Able to ambulated very limited distances due to his pain  2) Left carotid stenosis -Has followed up with cardiology.   4) Occult blood in stool -Hgb 10.6 on 3/23. Discussed with patient and daughter.   5) Intermittent, tingling, and aching pain, neuropathic: -increase Gabapentin at night  6) Hypertension -much better controlled! -but lisinopril causes cough  7) Overweight: weight 183- lost 5 lbs!  8) Gout: currently having a flare with 10/10 pain- taking one of colchicine daily without benefit. Does not drink alcohol. Eats meat.  9) Active smoker:  -1 cigarette per day  10) Right leg pain 2/2 PAD:  -getting progressively worse -only present in right leg -taking ibuprofen -no stomach pain -smokes 1 cigarette per day -gabapentin made him sleep too much -failed lyrica -daughter doesn't want him to try cymbalta  LOCATION OF PAIN  hand, leg   Pain worst in the morning, evening  Sleep is fair  Pain worsen with walking, standing, inactivity, standing, some activities   Pian improves with rest, medication  Ibuprofen gives fair relief  BOWEL Number of stools per week: 1 Oral laxative use No  Type of laxative None Enema or suppository use No  History of colostomy No  Incontinent No   BLADDER Normal and Pads  In and out cath, frequency N/A Able to self cath No  Bladder incontinence Yes  Frequent urination Yes  Leakage with coughing No  Difficulty starting stream No  Incomplete bladder emptying Yes    Mobility walk with assistance use a  walker how many minutes can you walk? 3 MINS ability to climb steps?  yes do you drive?  no use a wheelchair Do you have any goals in this area?  yes  Function retired I need assistance with the following:  feeding, dressing, bathing, toileting, meal prep, household duties and shopping Do you have any goals in this area?  yes  Neuro/Psych tremor trouble walking  Prior Studies Any changes since last visit?  no  Physicians involved in your care Any changes since last visit?  no   Family History  Problem Relation Age of Onset   Hypertension Mother    Colon cancer Neg Hx    Pancreatic cancer Neg Hx    Stomach cancer Neg Hx    Esophageal cancer Neg Hx    Inflammatory bowel disease Neg Hx    Liver disease Neg Hx    Rectal cancer Neg Hx    Social History   Socioeconomic History   Marital status: Single    Spouse name: Not on file   Number of children: 1   Years of education: 12   Highest education level: 12th grade  Occupational History   Not on file  Tobacco Use   Smoking status: Every Day    Packs/day: 1.00    Years: 20.00    Total pack years: 20.00    Types: Cigarettes    Passive exposure: Current   Smokeless tobacco: Never   Tobacco comments:    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, drilling  Vaping Use   Vaping Use: Never used  Substance and Sexual Activity   Alcohol use: Not Currently    Comment: occasional   Drug use: No   Sexual activity: Not Currently  Other Topics Concern   Not on file  Social History Narrative   Lives with daughter, son in law and 2 grandchildren   Right Handed   Drinks 1 cup caffeine 4 times a week-ish   Social Determinants of Health   Financial Resource Strain: Low Risk  (03/31/2022)   Overall Financial Resource Strain (CARDIA)    Difficulty of Paying Living Expenses: Not hard at all  Food Insecurity: No Food Insecurity (09/05/2022)   Hunger Vital Sign    Worried About Running Out of Food in the Last Year: Never true    Ran Out of  Food in the Last Year: Never true  Transportation Needs: No Transportation Needs (09/05/2022)   PRAPARE - Hydrologist (Medical): No    Lack of Transportation (Non-Medical): No  Physical Activity: Insufficiently Active (09/15/2022)   Exercise Vital Sign    Days of Exercise per Week: 1 day    Minutes of Exercise per Session: 10 min  Stress: Stress Concern Present (09/15/2022)   [place]    Feeling of Stress : To some extent  Social Connections: Socially Isolated (03/31/2022)   Social Connection and Isolation Panel [NHANES]    Frequency of Communication with Friends and Family: More than three times a week    Frequency of Social Gatherings with Friends and Family: More than three times a week    Attends Religious Services: Never    Marine scientist or  Organizations: No    Attends Archivist Meetings: Never    Marital Status: Divorced   Past Surgical History:  Procedure Laterality Date   BIOPSY  07/13/2020   Procedure: BIOPSY;  Surgeon: Ladene Artist, MD;  Location: [facility];  Service: Endoscopy;;   BIOPSY  11/28/2020   Procedure: BIOPSY;  Surgeon: Irving Copas., MD;  Location: [facility];  Service: Gastroenterology;;   CARDIAC CATHETERIZATION  2015   [facility]    COLONOSCOPY WITH PROPOFOL N/A 07/13/2020   Procedure: COLONOSCOPY WITH PROPOFOL;  Surgeon: Ladene Artist, MD;  Location: [facility] END[facility]COPY;  Service: Endoscopy;  Laterality: N/A;   COLONOSCOPY WITH PROPOFOL N/A 11/28/2020   Procedure: COLONOSCOPY WITH PROPOFOL;  Surgeon: Rush Landmark Telford Nab., MD;  Location: [facility] ENDOSCOPY;  Service: Gastroenterology;  Laterality: N/A;   ENDOSCOPIC MUCOSAL RESECTION N/A 11/28/2020   Procedure: ENDOSCOPIC MUCOSAL RESECTION;  Surgeon: Rush Landmark Telford Nab., MD;  Location: [facility];  Service: Gastroenterology;  Laterality: N/A;   ESOPHAGOGASTRODUODENOSCOPY N/A 07/13/2020    Procedure: ESOPHAGOGASTRODUODENOSCOPY (EGD);  Surgeon: Ladene Artist, MD;  Location: [facility] END[facility]COPY;  Service: Endoscopy;  Laterality: N/A;   ESOPHAGOGASTRODUODENOSCOPY (EGD) WITH PROPOFOL N/A 11/28/2020   Procedure: ESOPHAGOGASTRODUODENOSCOPY (EGD) WITH PROPOFOL;  Surgeon: Rush Landmark Telford Nab., MD;  Location: [facility];  Service: Gastroenterology;  Laterality: N/A;   HEMOSTASIS CLIP PLACEMENT  11/28/2020   Procedure: HEMOSTASIS CLIP PLACEMENT;  Surgeon: Irving Copas., MD;  Location: [facility];  Service: Gastroenterology;;   KNEE SURGERY Right    POLYPECTOMY  07/13/2020   Procedure: POLYPECTOMY;  Surgeon: Ladene Artist, MD;  Location: [facility];  Service: Endoscopy;;   POLYPECTOMY  11/28/2020   Procedure: POLYPECTOMY;  Surgeon: Irving Copas., MD;  Location: [facility];  Service: Gastroenterology;;   SUBMUCOSAL LIFTING INJECTION  11/28/2020   Procedure: SUBMUCOSAL LIFTING INJECTION;  Surgeon: Irving Copas., MD;  Location: [facility];  Service: Gastroenterology;;   SUBMUCOSAL TATTOO INJECTION  11/28/2020   Procedure: SUBMUCOSAL TATTOO INJECTION;  Surgeon: Irving Copas., MD;  Location: [facility];  Service: Gastroenterology;;   Past Medical History:  Diagnosis Date   Arthritis    Chronic back pain    Duodenal adenoma    Gout    Hypertension    Stroke ([place]) 06/2020   Syncope and collapse    BP (!) 111/57   Pulse 68   SpO2 97%   Opioid Risk Score:   Fall Risk Score:  `1  Depression screen [facility] 2/9     04/15/2022    9:10 AM 12/11/2021    8:43 AM 09/14/2020    8:44 AM  Depression screen PHQ 2/9  Decreased Interest 0 1 2  Down, Depressed, Hopeless 0 0 0  PHQ - 2 Score 0 1 2  Altered sleeping   2  Tired, decreased energy   2  Change in appetite   0  Feeling bad or failure about yourself    1  Trouble concentrating   1  Moving slowly or fidgety/restless   1  Suicidal thoughts   0  PHQ-9 Score   9   Review of Systems   Constitutional: Negative.   Eyes: Negative.   Respiratory:  Positive for shortness of breath.   Cardiovascular: Negative.   Gastrointestinal:  Positive for constipation.  Endocrine: Negative.   Genitourinary:  Positive for frequency.  Musculoskeletal:  Positive for gait problem.       Right sided pain & weakness  Skin: Negative.   Allergic/Immunologic: Negative.  Neurological:  Positive for tremors, weakness and numbness.  Hematological: Negative.   Psychiatric/Behavioral:  Positive for confusion.        Anxiety  All other systems reviewed and are negative.      Objective:    Physical Exam Gen: no distress, normal appearing HEENT: oral mucosa pink and moist, NCAT Cardio: Reg rate Chest: normal effort, normal rate of breathing Abd: soft, non-distended Ext: no edema Psych: pleasant, normal affect Musculoskeletal:     Cervical back: Normal range of motion and neck supple.     Comments: Normal Muscle Bulk and Muscle Testing Reveals:  Upper Extremities: Full ROM and Muscle Strength on Right 3/5 and Left 5/5 Lower Extremities: Full ROM and Muscle Strength 5/5 Arises from Table slowly using walker for support Narrow Based  Gait, antalgic with RW    Assessment & Plan:  1.Brainstem Infarct acute:Right Leg Weakness: Continue outpatient Therapy with [facility]. Neurology Following. Continue to monitor.  2.Occult Blood in Stools: Gastroenterology Following Dr Fuller Plan.  He's scheduled for Colonoscopy. Hgb reviewed and was 10.6 on 3/18. Continue to monitor.  3.Left carotid stenosis.Cardiology Following: Continue cardiology follow-up  4. Neuropathy: increase gabapentin to 300mg  TID 5. Right leg weakness: Provided with handicap placard  6.HTN: -tremendous improvement but cough with lisinopril: stop lisinopril and start amlodipine 10mg  daily and increase flomax to 0.8mg .  -Advised checking BP daily at home and logging results to bring into follow-up appointment with her PCP and  myself. -Advised regarding healthy foods that can help lower blood pressure and provided with a list: 1) citrus foods 2) salmon and other fatty fish 3) swiss chard (leafy green) 4) pumpkin seeds 5) Beans and lentils 6) Berries 7) Amaranth (whole grain, can be cooked similarly to rice and oats) 8) Pistachios 9) Carrots 10) Celery 11) Tomatoes 12) Broccoli 13) Greek yogurt 14) Herbs and spices: Celery seed, cilantro, saffron, lemongrass, black cumin, ginseng, cinnamon, cardamom, sweet basil, and ginger 15) Chia and flax seeds 16) Beets 17) spinach -Educated that goal BP is 120/80. -Made goal to incorporate some of the above foods into her diet.   7. Overweight: lost 5 lbs! Commended on progreass -Discussed foods that can assist in weight loss:  1) Eggs  2) Leafy greens  3) Salmon  4) Cruciferous vegetables  5) Lean beef and chicken breast  6) Boiled potatoes  7) Tuna  8) Beans and legumes  9) Soups  10) Cottage cheese  11) Avocados  12) Apple cider vinegar  13) Nuts  14) Whole grains  15) Chili pepper  16) Fruit- berries are some of the best  17) Grapefruit  18) Chia seeds  19) Coconut oil  20) Full-fat yogurt   8. Right leg pain 2/2 PAD: referred to vascular clinic -ordered Cilostozal -pain contract and UDS obtained today -tramadol BID ordered.   9. Iron deficiency anemia: -monitor iron levels -reviewed that recent iron level is normal.  -can discontinue iron supplements since he would like to decrease medications.   9. Active smoker: Commended on decreasing his cigarette use to once per day! Encouraged to stop to help PAD -stop naltexone  10. Gout:  -allopurinol ordered -Cr reviewed and is excellent -stop colchicine  11) Impaired mobility and ADLs: -would benefit from power wheelchair

## 2022-10-05 DIAGNOSIS — M6281 Muscle weakness (generalized): Secondary | ICD-10-CM | POA: Diagnosis not present

## 2022-10-07 ENCOUNTER — Telehealth: Payer: Self-pay | Admitting: Family

## 2022-10-07 DIAGNOSIS — I69319 Unspecified symptoms and signs involving cognitive functions following cerebral infarction: Secondary | ICD-10-CM | POA: Diagnosis not present

## 2022-10-07 DIAGNOSIS — I69398 Other sequelae of cerebral infarction: Secondary | ICD-10-CM | POA: Diagnosis not present

## 2022-10-07 DIAGNOSIS — G8929 Other chronic pain: Secondary | ICD-10-CM | POA: Diagnosis not present

## 2022-10-07 DIAGNOSIS — Z9181 History of falling: Secondary | ICD-10-CM | POA: Diagnosis not present

## 2022-10-07 DIAGNOSIS — I130 Hypertensive heart and chronic kidney disease with heart failure and stage 1 through stage 4 chronic kidney disease, or unspecified chronic kidney disease: Secondary | ICD-10-CM | POA: Diagnosis not present

## 2022-10-07 DIAGNOSIS — I5030 Unspecified diastolic (congestive) heart failure: Secondary | ICD-10-CM | POA: Diagnosis not present

## 2022-10-07 DIAGNOSIS — G9340 Encephalopathy, unspecified: Secondary | ICD-10-CM | POA: Diagnosis not present

## 2022-10-07 DIAGNOSIS — A419 Sepsis, unspecified organism: Secondary | ICD-10-CM | POA: Diagnosis not present

## 2022-10-07 DIAGNOSIS — N1832 Chronic kidney disease, stage 3b: Secondary | ICD-10-CM | POA: Diagnosis not present

## 2022-10-07 DIAGNOSIS — I69351 Hemiplegia and hemiparesis following cerebral infarction affecting right dominant side: Secondary | ICD-10-CM | POA: Diagnosis not present

## 2022-10-07 DIAGNOSIS — D132 Benign neoplasm of duodenum: Secondary | ICD-10-CM | POA: Diagnosis not present

## 2022-10-07 DIAGNOSIS — E876 Hypokalemia: Secondary | ICD-10-CM | POA: Diagnosis not present

## 2022-10-07 DIAGNOSIS — Z7982 Long term (current) use of aspirin: Secondary | ICD-10-CM | POA: Diagnosis not present

## 2022-10-07 DIAGNOSIS — M109 Gout, unspecified: Secondary | ICD-10-CM | POA: Diagnosis not present

## 2022-10-07 DIAGNOSIS — D630 Anemia in neoplastic disease: Secondary | ICD-10-CM | POA: Diagnosis not present

## 2022-10-07 DIAGNOSIS — M549 Dorsalgia, unspecified: Secondary | ICD-10-CM | POA: Diagnosis not present

## 2022-10-07 DIAGNOSIS — N390 Urinary tract infection, site not specified: Secondary | ICD-10-CM | POA: Diagnosis not present

## 2022-10-07 DIAGNOSIS — M1990 Unspecified osteoarthritis, unspecified site: Secondary | ICD-10-CM | POA: Diagnosis not present

## 2022-10-07 LAB — DRUG TOX MONITOR 1 W/CONF, ORAL FLD
Amphetamines: NEGATIVE ng/mL (ref <10)
Barbiturates: NEGATIVE ng/mL (ref <10)
Benzodiazepines: NEGATIVE ng/mL (ref <0.50)
Buprenorphine: NEGATIVE ng/mL (ref <0.10)
Cocaine: NEGATIVE ng/mL (ref <5.0)
Cotinine: 62.4 ng/mL — ABNORMAL HIGH (ref <5.0)
Fentanyl: NEGATIVE ng/mL (ref <0.10)
Heroin Metabolite: NEGATIVE ng/mL (ref <1.0)
MARIJUANA: NEGATIVE ng/mL (ref <2.5)
MDMA: NEGATIVE ng/mL (ref <10)
Meprobamate: NEGATIVE ng/mL (ref <2.5)
Methadone: NEGATIVE ng/mL (ref <5.0)
Nicotine Metabolite: POSITIVE ng/mL — AB (ref <5.0)
Opiates: NEGATIVE ng/mL (ref <2.5)
Phencyclidine: NEGATIVE ng/mL (ref <10)
Tapentadol: NEGATIVE ng/mL (ref <5.0)
Tramadol: NEGATIVE ng/mL (ref <5.0)
Zolpidem: NEGATIVE ng/mL (ref <5.0)

## 2022-10-07 LAB — DRUG TOX ALC METAB W/CON, ORAL FLD: Alcohol Metabolite: NEGATIVE ng/mL (ref <25)

## 2022-10-07 NOTE — Telephone Encounter (Signed)
Orders have been given.

## 2022-10-07 NOTE — Telephone Encounter (Signed)
Caller/Agency: [facility] Belenda Cruise)  Callback Number: [phone] (secure) Requesting OT/PT/Skilled Nursing/Social Work/Speech Therapy: PT Frequency: 2x for 5 weeks

## 2022-10-14 DIAGNOSIS — A419 Sepsis, unspecified organism: Secondary | ICD-10-CM | POA: Diagnosis not present

## 2022-10-14 DIAGNOSIS — M1990 Unspecified osteoarthritis, unspecified site: Secondary | ICD-10-CM | POA: Diagnosis not present

## 2022-10-14 DIAGNOSIS — D630 Anemia in neoplastic disease: Secondary | ICD-10-CM | POA: Diagnosis not present

## 2022-10-14 DIAGNOSIS — M549 Dorsalgia, unspecified: Secondary | ICD-10-CM | POA: Diagnosis not present

## 2022-10-14 DIAGNOSIS — M109 Gout, unspecified: Secondary | ICD-10-CM | POA: Diagnosis not present

## 2022-10-14 DIAGNOSIS — Z7982 Long term (current) use of aspirin: Secondary | ICD-10-CM | POA: Diagnosis not present

## 2022-10-14 DIAGNOSIS — I69351 Hemiplegia and hemiparesis following cerebral infarction affecting right dominant side: Secondary | ICD-10-CM | POA: Diagnosis not present

## 2022-10-14 DIAGNOSIS — G8929 Other chronic pain: Secondary | ICD-10-CM | POA: Diagnosis not present

## 2022-10-14 DIAGNOSIS — I69398 Other sequelae of cerebral infarction: Secondary | ICD-10-CM | POA: Diagnosis not present

## 2022-10-14 DIAGNOSIS — N390 Urinary tract infection, site not specified: Secondary | ICD-10-CM | POA: Diagnosis not present

## 2022-10-14 DIAGNOSIS — I130 Hypertensive heart and chronic kidney disease with heart failure and stage 1 through stage 4 chronic kidney disease, or unspecified chronic kidney disease: Secondary | ICD-10-CM | POA: Diagnosis not present

## 2022-10-14 DIAGNOSIS — I69319 Unspecified symptoms and signs involving cognitive functions following cerebral infarction: Secondary | ICD-10-CM | POA: Diagnosis not present

## 2022-10-14 DIAGNOSIS — D132 Benign neoplasm of duodenum: Secondary | ICD-10-CM | POA: Diagnosis not present

## 2022-10-14 DIAGNOSIS — G9340 Encephalopathy, unspecified: Secondary | ICD-10-CM | POA: Diagnosis not present

## 2022-10-14 DIAGNOSIS — E876 Hypokalemia: Secondary | ICD-10-CM | POA: Diagnosis not present

## 2022-10-14 DIAGNOSIS — I5030 Unspecified diastolic (congestive) heart failure: Secondary | ICD-10-CM | POA: Diagnosis not present

## 2022-10-14 DIAGNOSIS — N1832 Chronic kidney disease, stage 3b: Secondary | ICD-10-CM | POA: Diagnosis not present

## 2022-10-14 DIAGNOSIS — Z9181 History of falling: Secondary | ICD-10-CM | POA: Diagnosis not present

## 2022-10-15 ENCOUNTER — Telehealth: Payer: Self-pay | Admitting: Family

## 2022-10-15 NOTE — Telephone Encounter (Signed)
Orders have been given.  

## 2022-10-15 NOTE — Telephone Encounter (Signed)
[facility] just wanted to add two additional nurse visits.

## 2022-10-16 ENCOUNTER — Telehealth: Payer: Self-pay | Admitting: *Deleted

## 2022-10-16 ENCOUNTER — Ambulatory Visit: Payer: Self-pay

## 2022-10-16 DIAGNOSIS — I5030 Unspecified diastolic (congestive) heart failure: Secondary | ICD-10-CM | POA: Diagnosis not present

## 2022-10-16 DIAGNOSIS — D132 Benign neoplasm of duodenum: Secondary | ICD-10-CM | POA: Diagnosis not present

## 2022-10-16 DIAGNOSIS — Z7982 Long term (current) use of aspirin: Secondary | ICD-10-CM | POA: Diagnosis not present

## 2022-10-16 DIAGNOSIS — I69351 Hemiplegia and hemiparesis following cerebral infarction affecting right dominant side: Secondary | ICD-10-CM | POA: Diagnosis not present

## 2022-10-16 DIAGNOSIS — A419 Sepsis, unspecified organism: Secondary | ICD-10-CM | POA: Diagnosis not present

## 2022-10-16 DIAGNOSIS — M109 Gout, unspecified: Secondary | ICD-10-CM | POA: Diagnosis not present

## 2022-10-16 DIAGNOSIS — G9340 Encephalopathy, unspecified: Secondary | ICD-10-CM | POA: Diagnosis not present

## 2022-10-16 DIAGNOSIS — N390 Urinary tract infection, site not specified: Secondary | ICD-10-CM | POA: Diagnosis not present

## 2022-10-16 DIAGNOSIS — I130 Hypertensive heart and chronic kidney disease with heart failure and stage 1 through stage 4 chronic kidney disease, or unspecified chronic kidney disease: Secondary | ICD-10-CM | POA: Diagnosis not present

## 2022-10-16 DIAGNOSIS — E876 Hypokalemia: Secondary | ICD-10-CM | POA: Diagnosis not present

## 2022-10-16 DIAGNOSIS — N1832 Chronic kidney disease, stage 3b: Secondary | ICD-10-CM | POA: Diagnosis not present

## 2022-10-16 DIAGNOSIS — M549 Dorsalgia, unspecified: Secondary | ICD-10-CM | POA: Diagnosis not present

## 2022-10-16 DIAGNOSIS — I69398 Other sequelae of cerebral infarction: Secondary | ICD-10-CM | POA: Diagnosis not present

## 2022-10-16 DIAGNOSIS — M1990 Unspecified osteoarthritis, unspecified site: Secondary | ICD-10-CM | POA: Diagnosis not present

## 2022-10-16 DIAGNOSIS — G8929 Other chronic pain: Secondary | ICD-10-CM | POA: Diagnosis not present

## 2022-10-16 DIAGNOSIS — I69319 Unspecified symptoms and signs involving cognitive functions following cerebral infarction: Secondary | ICD-10-CM | POA: Diagnosis not present

## 2022-10-16 DIAGNOSIS — Z9181 History of falling: Secondary | ICD-10-CM | POA: Diagnosis not present

## 2022-10-16 DIAGNOSIS — D630 Anemia in neoplastic disease: Secondary | ICD-10-CM | POA: Diagnosis not present

## 2022-10-16 NOTE — Patient Outreach (Signed)
  Care Coordination   Follow Up Visit Note   10/16/2022 Name: Andres White MRN: [id number] DOB: 12/29/1945  Andres White is a 77 y.o. year old male who sees Valere Dross, Marvis Repress, [place] [facility] for primary care. I spoke with daughter [name] by phone today.  What matters to the patients health and wellness today?  Ms. Cruz Condon reports patient is doing well. He continues to participate with home health therapy. She reports that [facility] has scheduled to complete a home visit the first week of April, adding Andres White thinks it would be better for him.    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 duagher confirms that PT/RN continue to see patient and check for home oygen needs. per Ms. Womack-patient does not need oxygen at this time.]  Exercise Interventions   Exercise Discussed/Reviewed Exercise Reviewed, Assistive device use and maintanence  Education Interventions   Education Provided Provided Education  Provided Verbal Education On Exercise, Other  [advised to continue working with [facility] for therapy, discussed oxygen saturations. daughter states checked by home health staff and does not need oxygen at this time.]  Safety Interventions   Safety Discussed/Reviewed Fall Risk, Safety Reviewed           SDOH assessments and interventions completed:  No  Care Coordination Interventions:  Yes, provided   Follow up  plan: Follow up call scheduled for 11/24/22    Encounter Outcome:  Pt. Visit Completed   Thea Silversmith, RN, MSN, BSN, [place] Coordinator [phone]

## 2022-10-16 NOTE — Telephone Encounter (Signed)
Oral swab drug screen was negative for medications.

## 2022-10-16 NOTE — Patient Instructions (Signed)
Visit Information  Thank you for taking time to visit with me today. Please don't hesitate to contact me if I can be of assistance to you.   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 duagher confirms that PT/RN continue to see patient and check for home oygen needs. per Ms. Womack-patient does not need oxygen at this time.]  Exercise Interventions   Exercise Discussed/Reviewed Exercise Reviewed, Assistive device use and maintanence  Education Interventions   Education Provided Provided Education  Provided Verbal Education On Exercise, Other  [advised to continue working with [facility] for therapy, discussed oxygen saturations. daughter states checked by home health staff and does not need oxygen at this time.]  Safety Interventions   Safety Discussed/Reviewed Fall Risk, Safety Reviewed           Our next appointment is by telephone on 11/24/22 at 10:00 am  Please call the care guide team at [phone] if you need to cancel or reschedule your appointment.   If you are experiencing a Mental Health or [place] or need someone to talk to, please call the Suicide and Crisis Lifeline: [place], RN, MSN, BSN, [place] [phone]

## 2022-10-17 NOTE — Addendum Note (Signed)
Addended by: Izora Ribas on: 10/17/2022 02:30 PM   Modules accepted: Orders

## 2022-10-21 ENCOUNTER — Ambulatory Visit: Payer: Self-pay | Admitting: Licensed Clinical Social Worker

## 2022-10-21 DIAGNOSIS — Z7982 Long term (current) use of aspirin: Secondary | ICD-10-CM | POA: Diagnosis not present

## 2022-10-21 DIAGNOSIS — N390 Urinary tract infection, site not specified: Secondary | ICD-10-CM | POA: Diagnosis not present

## 2022-10-21 DIAGNOSIS — M1990 Unspecified osteoarthritis, unspecified site: Secondary | ICD-10-CM | POA: Diagnosis not present

## 2022-10-21 DIAGNOSIS — N1832 Chronic kidney disease, stage 3b: Secondary | ICD-10-CM | POA: Diagnosis not present

## 2022-10-21 DIAGNOSIS — I69351 Hemiplegia and hemiparesis following cerebral infarction affecting right dominant side: Secondary | ICD-10-CM | POA: Diagnosis not present

## 2022-10-21 DIAGNOSIS — E876 Hypokalemia: Secondary | ICD-10-CM | POA: Diagnosis not present

## 2022-10-21 DIAGNOSIS — I130 Hypertensive heart and chronic kidney disease with heart failure and stage 1 through stage 4 chronic kidney disease, or unspecified chronic kidney disease: Secondary | ICD-10-CM | POA: Diagnosis not present

## 2022-10-21 DIAGNOSIS — I69319 Unspecified symptoms and signs involving cognitive functions following cerebral infarction: Secondary | ICD-10-CM | POA: Diagnosis not present

## 2022-10-21 DIAGNOSIS — D630 Anemia in neoplastic disease: Secondary | ICD-10-CM | POA: Diagnosis not present

## 2022-10-21 DIAGNOSIS — Z9181 History of falling: Secondary | ICD-10-CM | POA: Diagnosis not present

## 2022-10-21 DIAGNOSIS — A419 Sepsis, unspecified organism: Secondary | ICD-10-CM | POA: Diagnosis not present

## 2022-10-21 DIAGNOSIS — D132 Benign neoplasm of duodenum: Secondary | ICD-10-CM | POA: Diagnosis not present

## 2022-10-21 DIAGNOSIS — M109 Gout, unspecified: Secondary | ICD-10-CM | POA: Diagnosis not present

## 2022-10-21 DIAGNOSIS — G8929 Other chronic pain: Secondary | ICD-10-CM | POA: Diagnosis not present

## 2022-10-21 DIAGNOSIS — M549 Dorsalgia, unspecified: Secondary | ICD-10-CM | POA: Diagnosis not present

## 2022-10-21 DIAGNOSIS — G9340 Encephalopathy, unspecified: Secondary | ICD-10-CM | POA: Diagnosis not present

## 2022-10-21 DIAGNOSIS — I5030 Unspecified diastolic (congestive) heart failure: Secondary | ICD-10-CM | POA: Diagnosis not present

## 2022-10-21 DIAGNOSIS — I69398 Other sequelae of cerebral infarction: Secondary | ICD-10-CM | POA: Diagnosis not present

## 2022-10-21 NOTE — Patient Outreach (Signed)
  Care Coordination   Follow Up Visit Note   10/21/2022 Name: Andres White MRN: [id number] DOB: 1945/12/18  Andres White is a 77 y.o. year old male who sees Valere Dross, Marvis Repress, [place] for primary care. I spoke with  Andres White Andres White, daughter of patient via phone today about client needs. .  What matters to the patients health and wellness today?  Patient needs daily help with ADLs and  ambulation.     Goals Addressed             This Visit's Progress    Patient needs daily help with ADLs and ambulation. Daughter, Andres White is supportive       Interventions:  LCSW spoke  today via phone with Andres White, daughter of patient .Andres White reported that client is receiving physical therapy support in the home and gets periodic RN visits in the home Discussed medication procurement. Discussed appetite of client Discussed ambulation of client. Client uses walker or wheelchair to help him ambulate Discussed family support for client. Daughter, Andres White , is very supportive of client Discussed ADLs completion. Andres White said client needs help with daily ADLs. Encouraged client or daughter, Andres White, to call LCSW as needed for SW support for client at [phone]  .          SDOH assessments and interventions completed:  Yes  SDOH Interventions Today    Flowsheet Row Most Recent Value  SDOH Interventions   Physical Activity Interventions Other (Comments)  [uses a walker or wheelchair to help with ambulation]  Stress Interventions Other (Comment)   [client has stress related to managing medical needs and managing daily care needs]        Care Coordination Interventions:  Yes, provided   Interventions Today    Flowsheet Row Most Recent Value  Chronic Disease   Chronic disease during today's visit Other  [spoke with Andres White, daughter of client, about client needs]  General Interventions   General Interventions Discussed/Reviewed General  Interventions Discussed, Community Resources  [reviewed program support]  Exercise Interventions   Exercise Discussed/Reviewed Physical Activity  [receives physical therapy sessions as scheduled in the home]  Education Interventions   Education Provided Provided Education  Provided Verbal Education On [employer]  Nutrition Interventions   Nutrition Discussed/Reviewed Nutrition Discussed        Follow up plan: Follow up call scheduled for 12/02/22 at 2:30 PM     Encounter Outcome:  Pt. Visit Completed   Norva Riffle.Aloria Looper MSW, [place] Holiday representative [facility] Care Management [phone]

## 2022-10-21 NOTE — Patient Instructions (Signed)
Visit Information  Thank you for taking time to visit with me today. Please don't hesitate to contact me if I can be of assistance to you.   Following are the goals we discussed today:   Goals Addressed             This Visit's Progress    Patient needs daily help with ADLs and ambulation. Daughter, [name] is supportive       Interventions:  LCSW spoke  today via phone with [name], daughter of patient .[name] reported that client is receiving physical therapy support in the home and gets periodic RN visits in the home Discussed medication procurement. Discussed appetite of client Discussed ambulation of client. Client uses walker or wheelchair to help him ambulate Discussed family support for client. Daughter, [name] , is very supportive of client Discussed ADLs completion. [name] said client needs help with daily ADLs. Encouraged client or daughter, [name], to call LCSW as needed for SW support for client at [phone]  .      Our next appointment is by telephone on 12/02/22 at 2:30 PM   Please call the care guide team at [phone] if you need to cancel or reschedule your appointment.   If you are experiencing a Mental Health or [place] or need someone to talk to, please go to [facility] [phone])   The patient / [name], daughter of patient, verbalized understanding of instructions, educational materials, and care plan provided today and DECLINED offer to receive copy of patient instructions, educational materials, and care plan.   The patient has been provided with contact information for the care management team and has been advised to call with any health related questions or concerns.   Norva Riffle.Ceciley Buist MSW, [place] Holiday representative [facility] Care Management [phone]

## 2022-10-23 ENCOUNTER — Encounter: Payer: Self-pay | Admitting: Nurse Practitioner

## 2022-10-23 ENCOUNTER — Ambulatory Visit: Payer: Medicare Other | Attending: Nurse Practitioner | Admitting: Nurse Practitioner

## 2022-10-23 VITALS — BP 114/82 | HR 82 | Ht 68.0 in | Wt 161.8 lb

## 2022-10-23 DIAGNOSIS — M549 Dorsalgia, unspecified: Secondary | ICD-10-CM | POA: Diagnosis not present

## 2022-10-23 DIAGNOSIS — Z8673 Personal history of transient ischemic attack (TIA), and cerebral infarction without residual deficits: Secondary | ICD-10-CM

## 2022-10-23 DIAGNOSIS — N1832 Chronic kidney disease, stage 3b: Secondary | ICD-10-CM | POA: Diagnosis not present

## 2022-10-23 DIAGNOSIS — G9340 Encephalopathy, unspecified: Secondary | ICD-10-CM | POA: Diagnosis not present

## 2022-10-23 DIAGNOSIS — I739 Peripheral vascular disease, unspecified: Secondary | ICD-10-CM

## 2022-10-23 DIAGNOSIS — I5032 Chronic diastolic (congestive) heart failure: Secondary | ICD-10-CM

## 2022-10-23 DIAGNOSIS — E785 Hyperlipidemia, unspecified: Secondary | ICD-10-CM

## 2022-10-23 DIAGNOSIS — N390 Urinary tract infection, site not specified: Secondary | ICD-10-CM | POA: Diagnosis not present

## 2022-10-23 DIAGNOSIS — I130 Hypertensive heart and chronic kidney disease with heart failure and stage 1 through stage 4 chronic kidney disease, or unspecified chronic kidney disease: Secondary | ICD-10-CM | POA: Diagnosis not present

## 2022-10-23 DIAGNOSIS — D132 Benign neoplasm of duodenum: Secondary | ICD-10-CM | POA: Diagnosis not present

## 2022-10-23 DIAGNOSIS — I472 Ventricular tachycardia, unspecified: Secondary | ICD-10-CM

## 2022-10-23 DIAGNOSIS — I69351 Hemiplegia and hemiparesis following cerebral infarction affecting right dominant side: Secondary | ICD-10-CM | POA: Diagnosis not present

## 2022-10-23 DIAGNOSIS — I5030 Unspecified diastolic (congestive) heart failure: Secondary | ICD-10-CM | POA: Diagnosis not present

## 2022-10-23 DIAGNOSIS — M1990 Unspecified osteoarthritis, unspecified site: Secondary | ICD-10-CM | POA: Diagnosis not present

## 2022-10-23 DIAGNOSIS — M109 Gout, unspecified: Secondary | ICD-10-CM | POA: Diagnosis not present

## 2022-10-23 DIAGNOSIS — G8929 Other chronic pain: Secondary | ICD-10-CM | POA: Diagnosis not present

## 2022-10-23 DIAGNOSIS — Z9181 History of falling: Secondary | ICD-10-CM | POA: Diagnosis not present

## 2022-10-23 DIAGNOSIS — I10 Essential (primary) hypertension: Secondary | ICD-10-CM | POA: Diagnosis not present

## 2022-10-23 DIAGNOSIS — Z7982 Long term (current) use of aspirin: Secondary | ICD-10-CM | POA: Diagnosis not present

## 2022-10-23 DIAGNOSIS — I6522 Occlusion and stenosis of left carotid artery: Secondary | ICD-10-CM | POA: Diagnosis not present

## 2022-10-23 DIAGNOSIS — A419 Sepsis, unspecified organism: Secondary | ICD-10-CM | POA: Diagnosis not present

## 2022-10-23 DIAGNOSIS — E876 Hypokalemia: Secondary | ICD-10-CM | POA: Diagnosis not present

## 2022-10-23 DIAGNOSIS — D630 Anemia in neoplastic disease: Secondary | ICD-10-CM | POA: Diagnosis not present

## 2022-10-23 DIAGNOSIS — I69398 Other sequelae of cerebral infarction: Secondary | ICD-10-CM | POA: Diagnosis not present

## 2022-10-23 DIAGNOSIS — I69319 Unspecified symptoms and signs involving cognitive functions following cerebral infarction: Secondary | ICD-10-CM | POA: Diagnosis not present

## 2022-10-23 DIAGNOSIS — N1831 Chronic kidney disease, stage 3a: Secondary | ICD-10-CM

## 2022-10-23 NOTE — Progress Notes (Signed)
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   Past Medical History    Past Medical History:  Diagnosis Date   Arthritis    Chronic back pain    Duodenal adenoma    Gout    Hypertension    Stroke ([place]) 06/2020   Syncope and collapse    Past Surgical History:  Procedure Laterality Date   BIOPSY  07/13/2020   Procedure: BIOPSY;  Surgeon: Ladene Artist, MD;  Location: [place];  Service: Endoscopy;;   BIOPSY  11/28/2020   Procedure: BIOPSY;  Surgeon: Irving Copas., MD;  Location: [facility];  Service: Gastroenterology;;   CARDIAC CATHETERIZATION  2015   [facility]    COLONOSCOPY WITH PROPOFOL N/A 07/13/2020   Procedure: COLONOSCOPY WITH PROPOFOL;  Surgeon: Ladene Artist, MD;  Location: [facility] ENDOSCOPY;  Service: Endoscopy;  Laterality: N/A;   COLONOSCOPY WITH PROPOFOL N/A 11/28/2020   Procedure: COLONOSCOPY WITH PROPOFOL;  Surgeon: Rush Landmark Telford Nab., MD;  Location: [facility] ENDOSCOPY;  Service: Gastroenterology;  Laterality: N/A;   ENDOSCOPIC MUCOSAL RESECTION N/A 11/28/2020   Procedure: ENDOSCOPIC MUCOSAL RESECTION;  Surgeon: Rush Landmark Telford Nab., MD;  Location: [facility]OSCOPY;  Service: Gastroenterology;  Laterality: N/A;   ESOPHAGOGASTRODUODENOSCOPY N/A 07/13/2020   Procedure: ESOPHAGOGASTRODUODENOSCOPY (EGD);  Surgeon: Ladene Artist, MD;  Location: [facility] ENDOSCOPY;  Service: Endoscopy;  Laterality: N/A;   ESOPHAGOGASTRODUODENOSCOPY (EGD) WITH PROPOFOL N/A 11/28/2020   Procedure: ESOPHAGOGASTRODUODENOSCOPY (EGD) WITH PROPOFOL;  Surgeon:  Rush Landmark Telford Nab., MD;  Location: [facility]OSCOPY;  Service: Gastroenterology;  Laterality: N/A;   HEMOSTASIS CLIP PLACEMENT  11/28/2020   Procedure: HEMOSTASIS CLIP PLACEMENT;  Surgeon: Irving Copas., MD;  Location: [facility];  Service: Gastroenterology;;   KNEE SURGERY Right    POLYPECTOMY  07/13/2020   Procedure: POLYPECTOMY;  Surgeon: Ladene Artist, MD;  Location: [place];  Service: Endoscopy;;   POLYPECTOMY  11/28/2020   Procedure: POLYPECTOMY;  Surgeon: Irving Copas., MD;  Location: [facility];  Service: Gastroenterology;;   Maryagnes Amos INJECTION  11/28/2020   Procedure: SUBMUCOSAL LIFTING INJECTION;  Surgeon: Irving Copas., MD;  Location: [facility];  Service: Gastroenterology;;   SUBMUCOSAL TATTOO INJECTION  11/28/2020   Procedure: SUBMUCOSAL TATTOO INJECTION;  Surgeon: Irving Copas., MD;  Location: [facility]OS[facility];  Service: Gastroenterology;;    Allergies  No Known Allergies   Labs/Other Studies Reviewed    The following studies were reviewed today:  Event monitor 10/2022: Enrollment 09/10/2022-09/30/2022. Minimum heart rate 44 bpm (sinus bradycardia). Maximum heart rate 166 bpm (sinus tachycardia). Average heart rate 64 bpm (normal sinus rhythm). Non-sustained ventricular tachycardia was present. Supraventricular ectopy were occasional (4%). Ventricular ectopy were occasional (6%). No atrial fibrillation detected.    Impression: Non-sustained ventricular tachycardia was present.  Occasional PVCs (6% burden) and PACs (4% burden).  No atrial fibrillation.   Lexiscan myoview 08/2020: Nuclear stress EF: 59%. The left ventricular ejection fraction is normal (55-65%). The study is normal. This is a low risk study.   Normal resting and stress perfusion. No ischemia or infarction EF 59%  Zio 08/2020:  Patient was monitored for 14 days.  The predominant rhythm was sinus with an average rate of 63 bpm (range 39-146 bpm in  sinus). There were rare PACs and occasional PVCs noted. 158 episodes of wide-complex tachycardia occurred (favor ventricular tachycardia over SVT with aberrancy). Most episodes were brief, though the longest one lasted 39 seconds. Maximum rate of VT was 187 bpm. A single atrial run lasting 6 beats was observed with a maximum rate of 103 bpm. No prolonged pause was identified. There were no patient triggered events.   Predominately sinus rhythm with multiple episodes of NSVT as well as one episode of sustained VT lasting 39 seconds.  Findings previously discussed with patient's family.  Cardiology evaluation for these results has already occurred.   Echo 06/2020: IMPRESSIONS     1. Left ventricular ejection fraction, by estimation, is 60 to 65%. The  left ventricle has normal function. The left ventricle has no regional  wall motion abnormalities. There is severe concentric left ventricular  hypertrophy. Left ventricular diastolic   parameters are consistent with Grade I diastolic dysfunction (impaired  relaxation).   2. Right ventricular systolic function is normal. The right ventricular  size is normal.   3. The mitral valve is normal in structure. Mild mitral valve  regurgitation. No evidence of mitral stenosis.   4. The aortic valve is normal in structure. Aortic valve regurgitation is  not visualized. No aortic stenosis is present.   5. The inferior vena cava is normal in size with greater than 50%  respiratory variability, suggesting right atrial pressure of 3 mmHg.   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  He has a history of VT versus SVT with aberrant conduction, not on beta-blocker due to prior bradycardia.  He has a history of CVA, carotid artery stenosis.  Also has a history of PAD, follows with vascular surgery.  Prior stress test in 2022 was negative for ischemia. Most recent echo in 2021 showed EF 60 to 65%, normal LV function, G1 DD, normal RV systolic function, mild mitral valve regurgitation.  He was last seen in the office on 08/01/2021 and was stable from a cardiac standpoint.  He denied any palpitations, denies symptoms concerning for angina.  He was hospitalized in 09/2022 in the setting of sepsis secondary to UTI, AKI.  Event monitor was ordered at the request of hospitalist MD due to worsening right-sided weakness, concern for CVA.  Monitor results were unchanged from prior.  He had NSVT, occasional PVCs and PACs, no evidence of atrial fibrillation.  He presents today for follow-up accompanied by his daughter.  Since his last visit and since his recent hospitalization he has done well from a cardiac standpoint.  He denies any palpitations, chest pain, dyspnea, dizziness, edema, PND, orthopnea, weight gain.  Overall, he reports feeling well.  Home Medications    Current Outpatient Medications  Medication Sig Dispense Refill   acetaminophen (TYLENOL) 325  MG tablet Take 1-2 tablets (325-650 mg total) by mouth every 4 (four) hours as needed for mild pain.     allopurinol (ZYLOPRIM) 100 MG tablet Take 1 tablet (100 mg total) by mouth daily. TAKE 1 TABLET(100 MG) BY MOUTH DAILY 90 tablet 3   amLODipine (NORVASC) 10 MG tablet Take 1 tablet (10 mg total) by mouth daily. 90 tablet 1   aspirin EC 81 MG tablet Take 1 tablet (81 mg total) by  mouth daily. (Patient taking differently: Take 81 mg by mouth in the morning.) 90 tablet 3   atorvastatin (LIPITOR) 40 MG tablet TAKE 1 TABLET BY MOUTH ONCE DAILY 30 tablet 5   cephALEXin (KEFLEX) 500 MG capsule Take 1 capsule (500 mg total) by mouth every 12 (twelve) hours. 1 capsule 0   cilostazol (PLETAL) 50 MG tablet TAKE 1 TABLET BY MOUTH TWICE DAILY 60 tablet 8   furosemide (LASIX) 20 MG tablet Take 1 tablet (20 mg total) by mouth every morning. (Patient taking differently: Take 20 mg by mouth every other day.) 60 tablet 0   hydrocerin (EUCERIN) CREA Apply 1 application. topically 2 (two) times daily. (Patient taking differently: Apply 1 application  topically 2 (two) times daily. Apply to both legs) 454 g 1   levETIRAcetam (KEPPRA) 500 MG tablet Take 1 tablet (500 mg total) by mouth 2 (two) times daily. 180 tablet 3   Multiple Vitamin (MULTIVITAMIN WITH MINERALS) TABS tablet Take 1 tablet by mouth daily. (Patient taking differently: Take 1 tablet by mouth daily at 12 noon.)     pantoprazole (PROTONIX) 40 MG tablet TAKE 1 TABLET BY MOUTH TWICE DAILY 60 tablet 5   tamsulosin (FLOMAX) 0.4 MG CAPS capsule Take 1 capsule (0.4 mg total) by mouth daily after supper. 60 capsule 8   traMADol (ULTRAM) 50 MG tablet Take 1 tablet (50 mg total) by mouth 2 (two) times daily as needed. 60 tablet 5   vitamin B-12 (CYANOCOBALAMIN) 1000 MCG tablet Take 1,000 mcg by mouth daily.     amoxicillin-clavulanate (AUGMENTIN) 875-125 MG tablet Take 1 tablet by mouth 2 (two) times daily. (Patient not taking: Reported on 10/23/2022)     QUEtiapine (SEROQUEL) 50 MG tablet Take 50 mg by mouth at bedtime. (Patient not taking: Reported on 10/23/2022)     No current facility-administered medications for this visit.     Review of Systems    He denies chest pain, palpitations, dyspnea, pnd, orthopnea, n, v, dizziness, syncope, edema, weight gain, or early satiety. All other systems reviewed and are otherwise negative except as  noted above.   Physical Exam    VS:  BP 114/82 (BP Location: Right Arm, Patient Position: Sitting, Cuff Size: Normal)   Pulse 82   Ht 5\' 8"  (1.727 m)   Wt 161 lb 12.8 oz (73.4 kg)   SpO2 98%   BMI 24.60 kg/m   GEN: Well nourished, well developed, in no acute distress. HEENT: normal. Neck: Supple, no JVD, carotid bruits, or masses. Cardiac: RRR, no murmurs, rubs, or gallops. No clubbing, cyanosis, edema.  Radials/DP/PT 2+ and equal bilaterally.  Respiratory:  Respirations regular and unlabored, clear to auscultation bilaterally. GI: Soft, nontender, nondistended, BS + x 4. MS: no deformity or atrophy. Skin: warm and dry, no rash. Neuro:  Strength and sensation are intact. Psych: Normal affect.  Accessory Clinical Findings    ECG personally reviewed by me today -no EKG in office today.  Lab Results  Component Value Date   WBC 8.8 09/04/2022  HGB 8.0 (L) 09/04/2022   HCT 24.1 (L) 09/04/2022   MCV 80.1 09/04/2022   PLT 177 09/04/2022   Lab Results  Component Value Date   CREATININE 1.07 09/04/2022   BUN 7 (L) 09/04/2022   NA 134 (L) 09/04/2022   K 3.5 09/04/2022   CL 104 09/04/2022   CO2 20 (L) 09/04/2022   Lab Results  Component Value Date   ALT 14 09/02/2022   AST 31 09/02/2022   ALKPHOS 68 09/02/2022   BILITOT 0.6 09/02/2022   Lab Results  Component Value Date   CHOL 104 12/10/2021   HDL 50.50 12/10/2021   LDLCALC 35 12/10/2021   TRIG 93.0 12/10/2021   CHOLHDL 2 12/10/2021    Lab Results  Component Value Date   HGBA1C 4.5 (L) 09/17/2020    Assessment & Plan    1. NSVT: History of NSVT, recent event monitor showed NSVT, occasional PVCs and PACs.  He denies any palpitations, dizziness, presyncope, syncope.  Not on beta-blocker due to prior bradycardia.  Overall stable.  2. Chronic diastolic heart failure: Most recent echo in 2021 showed EF 60 to 65%, normal LV function, G1 DD, normal RV systolic function, mild mitral valve regurgitation. Euvolemic and  well compensated on exam.  Continue Lasix.  3. History of CVA: History of prior CVA.  Concern for recent strokelike symptoms.  Recent cardiac event monitor showed no evidence of atrial fibrillation.  Following with neurology.  Continue aspirin, Lipitor.  4. Carotid artery stenosis: Due for repeat carotid dopplers. Follows with vascular surgery.  5. PAD: ABI's were stable on 12/2020, due for repeat study.  Follows with vascular surgery.  6. Hypertension: BP well controlled. Continue current antihypertensive regimen.   7. Hyperlipidemia: LDL was 35 in 12/2021.  Continue Lipitor.  8. CKD stage III: Creatinine was stable at 1.07 in 09/2022.  9. Disposition: Follow-up in 6 months.      Lenna Sciara, NP 10/23/2022, 9:18 PM

## 2022-10-23 NOTE — Patient Instructions (Addendum)
Medication Instructions:  Your physician recommends that you continue on your current medications as directed. Please refer to the Current Medication list given to you today.  *If you need a refill on your cardiac medications before your next appointment, please call your pharmacy*   Lab Work: NONE ordered at this time of appointment   If you have labs (blood work) drawn today and your tests are completely normal, you will receive your results only by: [place] (if you have MyChart) OR A paper copy in the mail If you have any lab test that is abnormal or we need to change your treatment, we will call you to review the results.   Testing/Procedures: NONE ordered at this time of appointment     Follow-Up: At [facility], you and your health needs are our priority.  As part of our continuing mission to provide you with exceptional heart care, we have created designated Provider Care Teams.  These Care Teams include your primary Cardiologist (physician) and Advanced Practice Providers (APPs -  Physician Assistants and Nurse Practitioners) who all work together to provide you with the care you need, when you need it.  We recommend signing up for the patient portal called "MyChart".  Sign up information is provided on this After Visit Summary.  MyChart is used to connect with patients for Virtual Visits (Telemedicine).  Patients are able to view lab/test results, encounter notes, upcoming appointments, etc.  Non-urgent messages can be sent to your provider as well.   To learn more about what you can do with MyChart, go to [email or web address].    Your next appointment:   6-7 month(s)  Provider:   Evalina Field, MD     Other Instructions

## 2022-10-24 NOTE — Progress Notes (Signed)
Kindly inform the patient that 3 weeks of heart monitor did not show evidence of atrial fibrillation

## 2022-10-27 ENCOUNTER — Telehealth: Payer: Self-pay

## 2022-10-27 DIAGNOSIS — I69351 Hemiplegia and hemiparesis following cerebral infarction affecting right dominant side: Secondary | ICD-10-CM | POA: Diagnosis not present

## 2022-10-27 DIAGNOSIS — I5030 Unspecified diastolic (congestive) heart failure: Secondary | ICD-10-CM | POA: Diagnosis not present

## 2022-10-27 DIAGNOSIS — I69398 Other sequelae of cerebral infarction: Secondary | ICD-10-CM | POA: Diagnosis not present

## 2022-10-27 DIAGNOSIS — E876 Hypokalemia: Secondary | ICD-10-CM | POA: Diagnosis not present

## 2022-10-27 DIAGNOSIS — Z7982 Long term (current) use of aspirin: Secondary | ICD-10-CM | POA: Diagnosis not present

## 2022-10-27 DIAGNOSIS — M1990 Unspecified osteoarthritis, unspecified site: Secondary | ICD-10-CM | POA: Diagnosis not present

## 2022-10-27 DIAGNOSIS — I130 Hypertensive heart and chronic kidney disease with heart failure and stage 1 through stage 4 chronic kidney disease, or unspecified chronic kidney disease: Secondary | ICD-10-CM | POA: Diagnosis not present

## 2022-10-27 DIAGNOSIS — M549 Dorsalgia, unspecified: Secondary | ICD-10-CM | POA: Diagnosis not present

## 2022-10-27 DIAGNOSIS — M109 Gout, unspecified: Secondary | ICD-10-CM | POA: Diagnosis not present

## 2022-10-27 DIAGNOSIS — N1832 Chronic kidney disease, stage 3b: Secondary | ICD-10-CM | POA: Diagnosis not present

## 2022-10-27 DIAGNOSIS — D630 Anemia in neoplastic disease: Secondary | ICD-10-CM | POA: Diagnosis not present

## 2022-10-27 DIAGNOSIS — G9340 Encephalopathy, unspecified: Secondary | ICD-10-CM | POA: Diagnosis not present

## 2022-10-27 DIAGNOSIS — G8929 Other chronic pain: Secondary | ICD-10-CM | POA: Diagnosis not present

## 2022-10-27 DIAGNOSIS — N390 Urinary tract infection, site not specified: Secondary | ICD-10-CM | POA: Diagnosis not present

## 2022-10-27 DIAGNOSIS — Z9181 History of falling: Secondary | ICD-10-CM | POA: Diagnosis not present

## 2022-10-27 DIAGNOSIS — A419 Sepsis, unspecified organism: Secondary | ICD-10-CM | POA: Diagnosis not present

## 2022-10-27 DIAGNOSIS — D132 Benign neoplasm of duodenum: Secondary | ICD-10-CM | POA: Diagnosis not present

## 2022-10-27 DIAGNOSIS — I69319 Unspecified symptoms and signs involving cognitive functions following cerebral infarction: Secondary | ICD-10-CM | POA: Diagnosis not present

## 2022-10-27 NOTE — Telephone Encounter (Signed)
Left msg for pt to call back to obtain results

## 2022-10-27 NOTE — Telephone Encounter (Signed)
-----   Message from Garvin Fila, MD sent at 10/24/2022  8:25 AM EDT ----- Andres White inform the patient that 3 weeks of heart monitor did not show evidence of atrial fibrillation

## 2022-10-28 DIAGNOSIS — I69319 Unspecified symptoms and signs involving cognitive functions following cerebral infarction: Secondary | ICD-10-CM | POA: Diagnosis not present

## 2022-10-28 DIAGNOSIS — N390 Urinary tract infection, site not specified: Secondary | ICD-10-CM | POA: Diagnosis not present

## 2022-10-28 DIAGNOSIS — Z7982 Long term (current) use of aspirin: Secondary | ICD-10-CM | POA: Diagnosis not present

## 2022-10-28 DIAGNOSIS — E876 Hypokalemia: Secondary | ICD-10-CM | POA: Diagnosis not present

## 2022-10-28 DIAGNOSIS — I130 Hypertensive heart and chronic kidney disease with heart failure and stage 1 through stage 4 chronic kidney disease, or unspecified chronic kidney disease: Secondary | ICD-10-CM | POA: Diagnosis not present

## 2022-10-28 DIAGNOSIS — I5030 Unspecified diastolic (congestive) heart failure: Secondary | ICD-10-CM | POA: Diagnosis not present

## 2022-10-28 DIAGNOSIS — M109 Gout, unspecified: Secondary | ICD-10-CM | POA: Diagnosis not present

## 2022-10-28 DIAGNOSIS — Z9181 History of falling: Secondary | ICD-10-CM | POA: Diagnosis not present

## 2022-10-28 DIAGNOSIS — N1832 Chronic kidney disease, stage 3b: Secondary | ICD-10-CM | POA: Diagnosis not present

## 2022-10-28 DIAGNOSIS — G8929 Other chronic pain: Secondary | ICD-10-CM | POA: Diagnosis not present

## 2022-10-28 DIAGNOSIS — D630 Anemia in neoplastic disease: Secondary | ICD-10-CM | POA: Diagnosis not present

## 2022-10-28 DIAGNOSIS — G9340 Encephalopathy, unspecified: Secondary | ICD-10-CM | POA: Diagnosis not present

## 2022-10-28 DIAGNOSIS — A419 Sepsis, unspecified organism: Secondary | ICD-10-CM | POA: Diagnosis not present

## 2022-10-28 DIAGNOSIS — D132 Benign neoplasm of duodenum: Secondary | ICD-10-CM | POA: Diagnosis not present

## 2022-10-28 DIAGNOSIS — I69398 Other sequelae of cerebral infarction: Secondary | ICD-10-CM | POA: Diagnosis not present

## 2022-10-28 DIAGNOSIS — M549 Dorsalgia, unspecified: Secondary | ICD-10-CM | POA: Diagnosis not present

## 2022-10-28 DIAGNOSIS — M1990 Unspecified osteoarthritis, unspecified site: Secondary | ICD-10-CM | POA: Diagnosis not present

## 2022-10-28 DIAGNOSIS — I69351 Hemiplegia and hemiparesis following cerebral infarction affecting right dominant side: Secondary | ICD-10-CM | POA: Diagnosis not present

## 2022-10-30 DIAGNOSIS — Z7982 Long term (current) use of aspirin: Secondary | ICD-10-CM | POA: Diagnosis not present

## 2022-10-30 DIAGNOSIS — M549 Dorsalgia, unspecified: Secondary | ICD-10-CM | POA: Diagnosis not present

## 2022-10-30 DIAGNOSIS — E876 Hypokalemia: Secondary | ICD-10-CM | POA: Diagnosis not present

## 2022-10-30 DIAGNOSIS — M1990 Unspecified osteoarthritis, unspecified site: Secondary | ICD-10-CM | POA: Diagnosis not present

## 2022-10-30 DIAGNOSIS — M109 Gout, unspecified: Secondary | ICD-10-CM | POA: Diagnosis not present

## 2022-10-30 DIAGNOSIS — I69398 Other sequelae of cerebral infarction: Secondary | ICD-10-CM | POA: Diagnosis not present

## 2022-10-30 DIAGNOSIS — G9340 Encephalopathy, unspecified: Secondary | ICD-10-CM | POA: Diagnosis not present

## 2022-10-30 DIAGNOSIS — D630 Anemia in neoplastic disease: Secondary | ICD-10-CM | POA: Diagnosis not present

## 2022-10-30 DIAGNOSIS — D132 Benign neoplasm of duodenum: Secondary | ICD-10-CM | POA: Diagnosis not present

## 2022-10-30 DIAGNOSIS — Z9181 History of falling: Secondary | ICD-10-CM | POA: Diagnosis not present

## 2022-10-30 DIAGNOSIS — I5030 Unspecified diastolic (congestive) heart failure: Secondary | ICD-10-CM | POA: Diagnosis not present

## 2022-10-30 DIAGNOSIS — I69351 Hemiplegia and hemiparesis following cerebral infarction affecting right dominant side: Secondary | ICD-10-CM | POA: Diagnosis not present

## 2022-10-30 DIAGNOSIS — I69319 Unspecified symptoms and signs involving cognitive functions following cerebral infarction: Secondary | ICD-10-CM | POA: Diagnosis not present

## 2022-10-30 DIAGNOSIS — N390 Urinary tract infection, site not specified: Secondary | ICD-10-CM | POA: Diagnosis not present

## 2022-10-30 DIAGNOSIS — G8929 Other chronic pain: Secondary | ICD-10-CM | POA: Diagnosis not present

## 2022-10-30 DIAGNOSIS — I130 Hypertensive heart and chronic kidney disease with heart failure and stage 1 through stage 4 chronic kidney disease, or unspecified chronic kidney disease: Secondary | ICD-10-CM | POA: Diagnosis not present

## 2022-10-30 DIAGNOSIS — N1832 Chronic kidney disease, stage 3b: Secondary | ICD-10-CM | POA: Diagnosis not present

## 2022-10-30 DIAGNOSIS — A419 Sepsis, unspecified organism: Secondary | ICD-10-CM | POA: Diagnosis not present

## 2022-10-31 ENCOUNTER — Other Ambulatory Visit: Payer: Self-pay | Admitting: Family

## 2022-10-31 ENCOUNTER — Other Ambulatory Visit: Payer: Self-pay | Admitting: Physical Medicine and Rehabilitation

## 2022-11-04 DIAGNOSIS — Z86010 Personal history of colonic polyps: Secondary | ICD-10-CM | POA: Diagnosis not present

## 2022-11-04 DIAGNOSIS — G629 Polyneuropathy, unspecified: Secondary | ICD-10-CM | POA: Diagnosis not present

## 2022-11-04 DIAGNOSIS — I69391 Dysphagia following cerebral infarction: Secondary | ICD-10-CM | POA: Diagnosis not present

## 2022-11-04 DIAGNOSIS — K219 Gastro-esophageal reflux disease without esophagitis: Secondary | ICD-10-CM | POA: Diagnosis not present

## 2022-11-04 DIAGNOSIS — I6522 Occlusion and stenosis of left carotid artery: Secondary | ICD-10-CM | POA: Diagnosis not present

## 2022-11-04 DIAGNOSIS — I5032 Chronic diastolic (congestive) heart failure: Secondary | ICD-10-CM | POA: Diagnosis not present

## 2022-11-04 DIAGNOSIS — M109 Gout, unspecified: Secondary | ICD-10-CM | POA: Diagnosis not present

## 2022-11-04 DIAGNOSIS — D509 Iron deficiency anemia, unspecified: Secondary | ICD-10-CM | POA: Diagnosis not present

## 2022-11-04 DIAGNOSIS — I69351 Hemiplegia and hemiparesis following cerebral infarction affecting right dominant side: Secondary | ICD-10-CM | POA: Diagnosis not present

## 2022-11-04 DIAGNOSIS — Z86018 Personal history of other benign neoplasm: Secondary | ICD-10-CM | POA: Diagnosis not present

## 2022-11-04 DIAGNOSIS — G9341 Metabolic encephalopathy: Secondary | ICD-10-CM | POA: Diagnosis not present

## 2022-11-04 DIAGNOSIS — F17210 Nicotine dependence, cigarettes, uncomplicated: Secondary | ICD-10-CM | POA: Diagnosis not present

## 2022-11-04 DIAGNOSIS — I69398 Other sequelae of cerebral infarction: Secondary | ICD-10-CM | POA: Diagnosis not present

## 2022-11-04 DIAGNOSIS — G8929 Other chronic pain: Secondary | ICD-10-CM | POA: Diagnosis not present

## 2022-11-04 DIAGNOSIS — Z87440 Personal history of urinary (tract) infections: Secondary | ICD-10-CM | POA: Diagnosis not present

## 2022-11-04 DIAGNOSIS — M1990 Unspecified osteoarthritis, unspecified site: Secondary | ICD-10-CM | POA: Diagnosis not present

## 2022-11-04 DIAGNOSIS — N1832 Chronic kidney disease, stage 3b: Secondary | ICD-10-CM | POA: Diagnosis not present

## 2022-11-04 DIAGNOSIS — Z7982 Long term (current) use of aspirin: Secondary | ICD-10-CM | POA: Diagnosis not present

## 2022-11-04 DIAGNOSIS — I69318 Other symptoms and signs involving cognitive functions following cerebral infarction: Secondary | ICD-10-CM | POA: Diagnosis not present

## 2022-11-04 DIAGNOSIS — M47812 Spondylosis without myelopathy or radiculopathy, cervical region: Secondary | ICD-10-CM | POA: Diagnosis not present

## 2022-11-04 DIAGNOSIS — I130 Hypertensive heart and chronic kidney disease with heart failure and stage 1 through stage 4 chronic kidney disease, or unspecified chronic kidney disease: Secondary | ICD-10-CM | POA: Diagnosis not present

## 2022-11-05 DIAGNOSIS — I69318 Other symptoms and signs involving cognitive functions following cerebral infarction: Secondary | ICD-10-CM | POA: Diagnosis not present

## 2022-11-05 DIAGNOSIS — G9341 Metabolic encephalopathy: Secondary | ICD-10-CM | POA: Diagnosis not present

## 2022-11-05 DIAGNOSIS — I69398 Other sequelae of cerebral infarction: Secondary | ICD-10-CM | POA: Diagnosis not present

## 2022-11-05 DIAGNOSIS — Z87440 Personal history of urinary (tract) infections: Secondary | ICD-10-CM | POA: Diagnosis not present

## 2022-11-05 DIAGNOSIS — I5032 Chronic diastolic (congestive) heart failure: Secondary | ICD-10-CM | POA: Diagnosis not present

## 2022-11-05 DIAGNOSIS — D509 Iron deficiency anemia, unspecified: Secondary | ICD-10-CM | POA: Diagnosis not present

## 2022-11-05 DIAGNOSIS — Z7982 Long term (current) use of aspirin: Secondary | ICD-10-CM | POA: Diagnosis not present

## 2022-11-05 DIAGNOSIS — I130 Hypertensive heart and chronic kidney disease with heart failure and stage 1 through stage 4 chronic kidney disease, or unspecified chronic kidney disease: Secondary | ICD-10-CM | POA: Diagnosis not present

## 2022-11-05 DIAGNOSIS — G8929 Other chronic pain: Secondary | ICD-10-CM | POA: Diagnosis not present

## 2022-11-05 DIAGNOSIS — N1832 Chronic kidney disease, stage 3b: Secondary | ICD-10-CM | POA: Diagnosis not present

## 2022-11-05 DIAGNOSIS — G629 Polyneuropathy, unspecified: Secondary | ICD-10-CM | POA: Diagnosis not present

## 2022-11-05 DIAGNOSIS — Z86018 Personal history of other benign neoplasm: Secondary | ICD-10-CM | POA: Diagnosis not present

## 2022-11-05 DIAGNOSIS — M1990 Unspecified osteoarthritis, unspecified site: Secondary | ICD-10-CM | POA: Diagnosis not present

## 2022-11-05 DIAGNOSIS — I10 Essential (primary) hypertension: Secondary | ICD-10-CM | POA: Diagnosis not present

## 2022-11-05 DIAGNOSIS — Z86010 Personal history of colonic polyps: Secondary | ICD-10-CM | POA: Diagnosis not present

## 2022-11-05 DIAGNOSIS — K219 Gastro-esophageal reflux disease without esophagitis: Secondary | ICD-10-CM | POA: Diagnosis not present

## 2022-11-05 DIAGNOSIS — I6522 Occlusion and stenosis of left carotid artery: Secondary | ICD-10-CM | POA: Diagnosis not present

## 2022-11-05 DIAGNOSIS — M6281 Muscle weakness (generalized): Secondary | ICD-10-CM | POA: Diagnosis not present

## 2022-11-05 DIAGNOSIS — M109 Gout, unspecified: Secondary | ICD-10-CM | POA: Diagnosis not present

## 2022-11-05 DIAGNOSIS — M47812 Spondylosis without myelopathy or radiculopathy, cervical region: Secondary | ICD-10-CM | POA: Diagnosis not present

## 2022-11-05 DIAGNOSIS — I69391 Dysphagia following cerebral infarction: Secondary | ICD-10-CM | POA: Diagnosis not present

## 2022-11-05 DIAGNOSIS — F17210 Nicotine dependence, cigarettes, uncomplicated: Secondary | ICD-10-CM | POA: Diagnosis not present

## 2022-11-05 DIAGNOSIS — I69351 Hemiplegia and hemiparesis following cerebral infarction affecting right dominant side: Secondary | ICD-10-CM | POA: Diagnosis not present

## 2022-11-11 DIAGNOSIS — K219 Gastro-esophageal reflux disease without esophagitis: Secondary | ICD-10-CM | POA: Diagnosis not present

## 2022-11-11 DIAGNOSIS — M109 Gout, unspecified: Secondary | ICD-10-CM | POA: Diagnosis not present

## 2022-11-11 DIAGNOSIS — I130 Hypertensive heart and chronic kidney disease with heart failure and stage 1 through stage 4 chronic kidney disease, or unspecified chronic kidney disease: Secondary | ICD-10-CM | POA: Diagnosis not present

## 2022-11-11 DIAGNOSIS — I69391 Dysphagia following cerebral infarction: Secondary | ICD-10-CM | POA: Diagnosis not present

## 2022-11-11 DIAGNOSIS — Z86010 Personal history of colonic polyps: Secondary | ICD-10-CM | POA: Diagnosis not present

## 2022-11-11 DIAGNOSIS — I6522 Occlusion and stenosis of left carotid artery: Secondary | ICD-10-CM | POA: Diagnosis not present

## 2022-11-11 DIAGNOSIS — F17210 Nicotine dependence, cigarettes, uncomplicated: Secondary | ICD-10-CM | POA: Diagnosis not present

## 2022-11-11 DIAGNOSIS — G9341 Metabolic encephalopathy: Secondary | ICD-10-CM | POA: Diagnosis not present

## 2022-11-11 DIAGNOSIS — M1990 Unspecified osteoarthritis, unspecified site: Secondary | ICD-10-CM | POA: Diagnosis not present

## 2022-11-11 DIAGNOSIS — N1832 Chronic kidney disease, stage 3b: Secondary | ICD-10-CM | POA: Diagnosis not present

## 2022-11-11 DIAGNOSIS — D509 Iron deficiency anemia, unspecified: Secondary | ICD-10-CM | POA: Diagnosis not present

## 2022-11-11 DIAGNOSIS — I5032 Chronic diastolic (congestive) heart failure: Secondary | ICD-10-CM | POA: Diagnosis not present

## 2022-11-11 DIAGNOSIS — Z7982 Long term (current) use of aspirin: Secondary | ICD-10-CM | POA: Diagnosis not present

## 2022-11-11 DIAGNOSIS — Z86018 Personal history of other benign neoplasm: Secondary | ICD-10-CM | POA: Diagnosis not present

## 2022-11-11 DIAGNOSIS — G629 Polyneuropathy, unspecified: Secondary | ICD-10-CM | POA: Diagnosis not present

## 2022-11-11 DIAGNOSIS — I69318 Other symptoms and signs involving cognitive functions following cerebral infarction: Secondary | ICD-10-CM | POA: Diagnosis not present

## 2022-11-11 DIAGNOSIS — I69351 Hemiplegia and hemiparesis following cerebral infarction affecting right dominant side: Secondary | ICD-10-CM | POA: Diagnosis not present

## 2022-11-11 DIAGNOSIS — I69398 Other sequelae of cerebral infarction: Secondary | ICD-10-CM | POA: Diagnosis not present

## 2022-11-11 DIAGNOSIS — Z87440 Personal history of urinary (tract) infections: Secondary | ICD-10-CM | POA: Diagnosis not present

## 2022-11-11 DIAGNOSIS — M47812 Spondylosis without myelopathy or radiculopathy, cervical region: Secondary | ICD-10-CM | POA: Diagnosis not present

## 2022-11-11 DIAGNOSIS — G8929 Other chronic pain: Secondary | ICD-10-CM | POA: Diagnosis not present

## 2022-11-13 ENCOUNTER — Ambulatory Visit: Payer: Medicare Other | Admitting: Adult Health

## 2022-11-13 DIAGNOSIS — I5032 Chronic diastolic (congestive) heart failure: Secondary | ICD-10-CM | POA: Diagnosis not present

## 2022-11-13 DIAGNOSIS — G8929 Other chronic pain: Secondary | ICD-10-CM | POA: Diagnosis not present

## 2022-11-13 DIAGNOSIS — I69351 Hemiplegia and hemiparesis following cerebral infarction affecting right dominant side: Secondary | ICD-10-CM | POA: Diagnosis not present

## 2022-11-13 DIAGNOSIS — Z7982 Long term (current) use of aspirin: Secondary | ICD-10-CM | POA: Diagnosis not present

## 2022-11-13 DIAGNOSIS — M109 Gout, unspecified: Secondary | ICD-10-CM | POA: Diagnosis not present

## 2022-11-13 DIAGNOSIS — G9341 Metabolic encephalopathy: Secondary | ICD-10-CM | POA: Diagnosis not present

## 2022-11-13 DIAGNOSIS — N1832 Chronic kidney disease, stage 3b: Secondary | ICD-10-CM | POA: Diagnosis not present

## 2022-11-13 DIAGNOSIS — I130 Hypertensive heart and chronic kidney disease with heart failure and stage 1 through stage 4 chronic kidney disease, or unspecified chronic kidney disease: Secondary | ICD-10-CM | POA: Diagnosis not present

## 2022-11-13 DIAGNOSIS — K219 Gastro-esophageal reflux disease without esophagitis: Secondary | ICD-10-CM | POA: Diagnosis not present

## 2022-11-13 DIAGNOSIS — M47812 Spondylosis without myelopathy or radiculopathy, cervical region: Secondary | ICD-10-CM | POA: Diagnosis not present

## 2022-11-13 DIAGNOSIS — F17210 Nicotine dependence, cigarettes, uncomplicated: Secondary | ICD-10-CM | POA: Diagnosis not present

## 2022-11-13 DIAGNOSIS — G629 Polyneuropathy, unspecified: Secondary | ICD-10-CM | POA: Diagnosis not present

## 2022-11-13 DIAGNOSIS — Z86018 Personal history of other benign neoplasm: Secondary | ICD-10-CM | POA: Diagnosis not present

## 2022-11-13 DIAGNOSIS — I6522 Occlusion and stenosis of left carotid artery: Secondary | ICD-10-CM | POA: Diagnosis not present

## 2022-11-13 DIAGNOSIS — Z87440 Personal history of urinary (tract) infections: Secondary | ICD-10-CM | POA: Diagnosis not present

## 2022-11-13 DIAGNOSIS — D509 Iron deficiency anemia, unspecified: Secondary | ICD-10-CM | POA: Diagnosis not present

## 2022-11-13 DIAGNOSIS — Z86010 Personal history of colonic polyps: Secondary | ICD-10-CM | POA: Diagnosis not present

## 2022-11-13 DIAGNOSIS — I69318 Other symptoms and signs involving cognitive functions following cerebral infarction: Secondary | ICD-10-CM | POA: Diagnosis not present

## 2022-11-13 DIAGNOSIS — I69391 Dysphagia following cerebral infarction: Secondary | ICD-10-CM | POA: Diagnosis not present

## 2022-11-13 DIAGNOSIS — M1990 Unspecified osteoarthritis, unspecified site: Secondary | ICD-10-CM | POA: Diagnosis not present

## 2022-11-13 DIAGNOSIS — I69398 Other sequelae of cerebral infarction: Secondary | ICD-10-CM | POA: Diagnosis not present

## 2022-11-17 ENCOUNTER — Ambulatory Visit: Payer: Self-pay | Admitting: Licensed Clinical Social Worker

## 2022-11-17 ENCOUNTER — Ambulatory Visit: Payer: Medicare Other | Admitting: Adult Health

## 2022-11-17 NOTE — Patient Instructions (Signed)
Visit Information  Thank you for taking time to visit with me today. Please don't hesitate to contact me if I can be of assistance to you.   Following are the goals we discussed today:   Goals Addressed             This Visit's Progress    Patient needs daily help with ADLs and ambulation.       Interventions: LCSW spoke via phone with Blima Ledger, daughter of client. LCSW discussed program support with Marily Memos reported that client is receiving physical therapy sessions as scheduled weekly in the home  Herbert Seta reported that client is now receiving support with [facility]  She said client is pleased with support with [facility]. Blima Ledger said client is no longer seeing Ria Clock NP since he now is patient with [facility]. LCSW thanked Research scientist (physical sciences) for update regarding client status.     No further interventions are required at present.   Please call the care guide team at [phone] if you need to cancel or reschedule your appointment.   If you are experiencing a Mental Health or Behavioral Health Crisis or need someone to talk to, please go to [facility] [address] [place] [phone])   The patient / Blima Ledger, daughter, verbalized understanding of instructions, educational materials, and care plan provided today and DECLINED offer to receive copy of patient instructions, educational materials, and care plan.   The patient / Blima Ledger, daughter, has been provided with contact information for the care management team and has been advised to call with any health related questions or concerns.   Kelton Pillar.Aadil Sur MSW, LCSW Licensed Visual merchandiser [facility] Care Management [phone]

## 2022-11-17 NOTE — Patient Outreach (Signed)
  Care Coordination   Follow Up Visit Note   11/17/2022 Name: [name] MRN: [id number] DOB: 12-21-1945  Andres White is a 77 y.o. year old male who sees Dayton Scrape, Allyne Gee, FNP for primary care. I spoke with  Andres White / Blima Ledger, daughter of client,via phone today about client needs.   What matters to the patients health and wellness today?  Client needs daily help with ADLs and ambulation    Goals Addressed             This Visit's Progress    Patient needs daily help with ADLs and ambulation.       Interventions: LCSW spoke via phone with Blima Ledger, daughter of client. LCSW discussed program support with Marily Memos reported that client is receiving physical therapy sessions as scheduled weekly in the home  Herbert Seta reported that client is now receiving support with [facility]  She said client is pleased with support with [facility]. Blima Ledger said client is no longer seeing Ria Clock NP since he now is patient with [facility]. LCSW thanked Research scientist (physical sciences) for update regarding client status.       SDOH assessments and interventions completed:  Yes    Care Coordination Interventions:  Yes, provided   Interventions Today    Flowsheet Row Most Recent Value  Chronic Disease   Chronic disease during today's visit Other  [LCSW spoke via phone with Blima Ledger, daughter of patient]  General Interventions   General Interventions Discussed/Reviewed General Interventions Discussed, Community Resources  [discussed program services]  Exercise Interventions   Exercise Discussed/Reviewed Physical Activity  Education Interventions   Education Provided Provided Education  Provided Verbal Education On [employer]  Mental Health Interventions   Mental Health Discussed/Reviewed Coping Strategies  [client needs some help with ADLs completion]  Safety Interventions   Safety Discussed/Reviewed Home Safety  [client is receiving  Home Health Physical Therapy sessions as scheduled]        Follow up plan: No further intervention required.   Encounter Outcome:  Pt. Visit Completed   Kelton Pillar.Erla Bacchi MSW, LCSW Licensed Visual merchandiser [facility] Care Management [phone]

## 2022-11-18 DIAGNOSIS — I69318 Other symptoms and signs involving cognitive functions following cerebral infarction: Secondary | ICD-10-CM | POA: Diagnosis not present

## 2022-11-18 DIAGNOSIS — Z87440 Personal history of urinary (tract) infections: Secondary | ICD-10-CM | POA: Diagnosis not present

## 2022-11-18 DIAGNOSIS — I69391 Dysphagia following cerebral infarction: Secondary | ICD-10-CM | POA: Diagnosis not present

## 2022-11-18 DIAGNOSIS — G9341 Metabolic encephalopathy: Secondary | ICD-10-CM | POA: Diagnosis not present

## 2022-11-18 DIAGNOSIS — M1990 Unspecified osteoarthritis, unspecified site: Secondary | ICD-10-CM | POA: Diagnosis not present

## 2022-11-18 DIAGNOSIS — I69398 Other sequelae of cerebral infarction: Secondary | ICD-10-CM | POA: Diagnosis not present

## 2022-11-18 DIAGNOSIS — Z7982 Long term (current) use of aspirin: Secondary | ICD-10-CM | POA: Diagnosis not present

## 2022-11-18 DIAGNOSIS — Z86018 Personal history of other benign neoplasm: Secondary | ICD-10-CM | POA: Diagnosis not present

## 2022-11-18 DIAGNOSIS — M47812 Spondylosis without myelopathy or radiculopathy, cervical region: Secondary | ICD-10-CM | POA: Diagnosis not present

## 2022-11-18 DIAGNOSIS — I130 Hypertensive heart and chronic kidney disease with heart failure and stage 1 through stage 4 chronic kidney disease, or unspecified chronic kidney disease: Secondary | ICD-10-CM | POA: Diagnosis not present

## 2022-11-18 DIAGNOSIS — I6522 Occlusion and stenosis of left carotid artery: Secondary | ICD-10-CM | POA: Diagnosis not present

## 2022-11-18 DIAGNOSIS — F17210 Nicotine dependence, cigarettes, uncomplicated: Secondary | ICD-10-CM | POA: Diagnosis not present

## 2022-11-18 DIAGNOSIS — Z86010 Personal history of colonic polyps: Secondary | ICD-10-CM | POA: Diagnosis not present

## 2022-11-18 DIAGNOSIS — K219 Gastro-esophageal reflux disease without esophagitis: Secondary | ICD-10-CM | POA: Diagnosis not present

## 2022-11-18 DIAGNOSIS — G8929 Other chronic pain: Secondary | ICD-10-CM | POA: Diagnosis not present

## 2022-11-18 DIAGNOSIS — M109 Gout, unspecified: Secondary | ICD-10-CM | POA: Diagnosis not present

## 2022-11-18 DIAGNOSIS — I5032 Chronic diastolic (congestive) heart failure: Secondary | ICD-10-CM | POA: Diagnosis not present

## 2022-11-18 DIAGNOSIS — I69351 Hemiplegia and hemiparesis following cerebral infarction affecting right dominant side: Secondary | ICD-10-CM | POA: Diagnosis not present

## 2022-11-18 DIAGNOSIS — G629 Polyneuropathy, unspecified: Secondary | ICD-10-CM | POA: Diagnosis not present

## 2022-11-18 DIAGNOSIS — L89600 Pressure ulcer of unspecified heel, unstageable: Secondary | ICD-10-CM | POA: Diagnosis not present

## 2022-11-18 DIAGNOSIS — D509 Iron deficiency anemia, unspecified: Secondary | ICD-10-CM | POA: Diagnosis not present

## 2022-11-18 DIAGNOSIS — R54 Age-related physical debility: Secondary | ICD-10-CM | POA: Diagnosis not present

## 2022-11-18 DIAGNOSIS — N1832 Chronic kidney disease, stage 3b: Secondary | ICD-10-CM | POA: Diagnosis not present

## 2022-11-19 ENCOUNTER — Ambulatory Visit: Payer: Medicare Other | Admitting: Adult Health

## 2022-11-20 DIAGNOSIS — Z87440 Personal history of urinary (tract) infections: Secondary | ICD-10-CM | POA: Diagnosis not present

## 2022-11-20 DIAGNOSIS — G629 Polyneuropathy, unspecified: Secondary | ICD-10-CM | POA: Diagnosis not present

## 2022-11-20 DIAGNOSIS — G9341 Metabolic encephalopathy: Secondary | ICD-10-CM | POA: Diagnosis not present

## 2022-11-20 DIAGNOSIS — Z86010 Personal history of colonic polyps: Secondary | ICD-10-CM | POA: Diagnosis not present

## 2022-11-20 DIAGNOSIS — D509 Iron deficiency anemia, unspecified: Secondary | ICD-10-CM | POA: Diagnosis not present

## 2022-11-20 DIAGNOSIS — Z7982 Long term (current) use of aspirin: Secondary | ICD-10-CM | POA: Diagnosis not present

## 2022-11-20 DIAGNOSIS — I69398 Other sequelae of cerebral infarction: Secondary | ICD-10-CM | POA: Diagnosis not present

## 2022-11-20 DIAGNOSIS — G8929 Other chronic pain: Secondary | ICD-10-CM | POA: Diagnosis not present

## 2022-11-20 DIAGNOSIS — K219 Gastro-esophageal reflux disease without esophagitis: Secondary | ICD-10-CM | POA: Diagnosis not present

## 2022-11-20 DIAGNOSIS — I69351 Hemiplegia and hemiparesis following cerebral infarction affecting right dominant side: Secondary | ICD-10-CM | POA: Diagnosis not present

## 2022-11-20 DIAGNOSIS — F17210 Nicotine dependence, cigarettes, uncomplicated: Secondary | ICD-10-CM | POA: Diagnosis not present

## 2022-11-20 DIAGNOSIS — I130 Hypertensive heart and chronic kidney disease with heart failure and stage 1 through stage 4 chronic kidney disease, or unspecified chronic kidney disease: Secondary | ICD-10-CM | POA: Diagnosis not present

## 2022-11-20 DIAGNOSIS — M47812 Spondylosis without myelopathy or radiculopathy, cervical region: Secondary | ICD-10-CM | POA: Diagnosis not present

## 2022-11-20 DIAGNOSIS — I6522 Occlusion and stenosis of left carotid artery: Secondary | ICD-10-CM | POA: Diagnosis not present

## 2022-11-20 DIAGNOSIS — M109 Gout, unspecified: Secondary | ICD-10-CM | POA: Diagnosis not present

## 2022-11-20 DIAGNOSIS — I69391 Dysphagia following cerebral infarction: Secondary | ICD-10-CM | POA: Diagnosis not present

## 2022-11-20 DIAGNOSIS — N1832 Chronic kidney disease, stage 3b: Secondary | ICD-10-CM | POA: Diagnosis not present

## 2022-11-20 DIAGNOSIS — I5032 Chronic diastolic (congestive) heart failure: Secondary | ICD-10-CM | POA: Diagnosis not present

## 2022-11-20 DIAGNOSIS — M1990 Unspecified osteoarthritis, unspecified site: Secondary | ICD-10-CM | POA: Diagnosis not present

## 2022-11-20 DIAGNOSIS — I69318 Other symptoms and signs involving cognitive functions following cerebral infarction: Secondary | ICD-10-CM | POA: Diagnosis not present

## 2022-11-20 DIAGNOSIS — Z86018 Personal history of other benign neoplasm: Secondary | ICD-10-CM | POA: Diagnosis not present

## 2022-11-24 ENCOUNTER — Telehealth: Payer: Self-pay

## 2022-11-24 NOTE — Patient Outreach (Unsigned)
  Care Coordination   11/24/2022  Name: KODAH MARET MRN: [id number] DOB: 02-06-1946   Care Coordination Outreach Attempts:  An unsuccessful telephone outreach was attempted for a scheduled appointment today.  Follow Up Plan:  Additional outreach attempts will be made to offer the patient care coordination information and services.   Encounter Outcome:  No Answer {THN Tip this will not be part of the note when signed-REQUIRED REPORT FIELD DO NOT DELETE (Optional):27901}  Care Coordination Interventions:  No, not indicated  {THN Tip this will not be part of the note when signed-REQUIRED REPORT FIELD DO NOT DELETE (Optional):27901}  Kathyrn Sheriff, RN, MSN, BSN, CCM [facility] Care Coordinator [phone]

## 2022-11-25 DIAGNOSIS — Z86018 Personal history of other benign neoplasm: Secondary | ICD-10-CM | POA: Diagnosis not present

## 2022-11-25 DIAGNOSIS — I5032 Chronic diastolic (congestive) heart failure: Secondary | ICD-10-CM | POA: Diagnosis not present

## 2022-11-25 DIAGNOSIS — I69398 Other sequelae of cerebral infarction: Secondary | ICD-10-CM | POA: Diagnosis not present

## 2022-11-25 DIAGNOSIS — N1832 Chronic kidney disease, stage 3b: Secondary | ICD-10-CM | POA: Diagnosis not present

## 2022-11-25 DIAGNOSIS — D509 Iron deficiency anemia, unspecified: Secondary | ICD-10-CM | POA: Diagnosis not present

## 2022-11-25 DIAGNOSIS — I69391 Dysphagia following cerebral infarction: Secondary | ICD-10-CM | POA: Diagnosis not present

## 2022-11-25 DIAGNOSIS — I69318 Other symptoms and signs involving cognitive functions following cerebral infarction: Secondary | ICD-10-CM | POA: Diagnosis not present

## 2022-11-25 DIAGNOSIS — Z86010 Personal history of colonic polyps: Secondary | ICD-10-CM | POA: Diagnosis not present

## 2022-11-25 DIAGNOSIS — F17210 Nicotine dependence, cigarettes, uncomplicated: Secondary | ICD-10-CM | POA: Diagnosis not present

## 2022-11-25 DIAGNOSIS — K219 Gastro-esophageal reflux disease without esophagitis: Secondary | ICD-10-CM | POA: Diagnosis not present

## 2022-11-25 DIAGNOSIS — I130 Hypertensive heart and chronic kidney disease with heart failure and stage 1 through stage 4 chronic kidney disease, or unspecified chronic kidney disease: Secondary | ICD-10-CM | POA: Diagnosis not present

## 2022-11-25 DIAGNOSIS — M47812 Spondylosis without myelopathy or radiculopathy, cervical region: Secondary | ICD-10-CM | POA: Diagnosis not present

## 2022-11-25 DIAGNOSIS — G9341 Metabolic encephalopathy: Secondary | ICD-10-CM | POA: Diagnosis not present

## 2022-11-25 DIAGNOSIS — G629 Polyneuropathy, unspecified: Secondary | ICD-10-CM | POA: Diagnosis not present

## 2022-11-25 DIAGNOSIS — Z7982 Long term (current) use of aspirin: Secondary | ICD-10-CM | POA: Diagnosis not present

## 2022-11-25 DIAGNOSIS — M109 Gout, unspecified: Secondary | ICD-10-CM | POA: Diagnosis not present

## 2022-11-25 DIAGNOSIS — I6522 Occlusion and stenosis of left carotid artery: Secondary | ICD-10-CM | POA: Diagnosis not present

## 2022-11-25 DIAGNOSIS — I69351 Hemiplegia and hemiparesis following cerebral infarction affecting right dominant side: Secondary | ICD-10-CM | POA: Diagnosis not present

## 2022-11-25 DIAGNOSIS — G8929 Other chronic pain: Secondary | ICD-10-CM | POA: Diagnosis not present

## 2022-11-25 DIAGNOSIS — M1990 Unspecified osteoarthritis, unspecified site: Secondary | ICD-10-CM | POA: Diagnosis not present

## 2022-11-25 DIAGNOSIS — Z87440 Personal history of urinary (tract) infections: Secondary | ICD-10-CM | POA: Diagnosis not present

## 2022-11-27 DIAGNOSIS — I69391 Dysphagia following cerebral infarction: Secondary | ICD-10-CM | POA: Diagnosis not present

## 2022-11-27 DIAGNOSIS — Z87440 Personal history of urinary (tract) infections: Secondary | ICD-10-CM | POA: Diagnosis not present

## 2022-11-27 DIAGNOSIS — G629 Polyneuropathy, unspecified: Secondary | ICD-10-CM | POA: Diagnosis not present

## 2022-11-27 DIAGNOSIS — G8929 Other chronic pain: Secondary | ICD-10-CM | POA: Diagnosis not present

## 2022-11-27 DIAGNOSIS — K219 Gastro-esophageal reflux disease without esophagitis: Secondary | ICD-10-CM | POA: Diagnosis not present

## 2022-11-27 DIAGNOSIS — M47812 Spondylosis without myelopathy or radiculopathy, cervical region: Secondary | ICD-10-CM | POA: Diagnosis not present

## 2022-11-27 DIAGNOSIS — M1990 Unspecified osteoarthritis, unspecified site: Secondary | ICD-10-CM | POA: Diagnosis not present

## 2022-11-27 DIAGNOSIS — I5032 Chronic diastolic (congestive) heart failure: Secondary | ICD-10-CM | POA: Diagnosis not present

## 2022-11-27 DIAGNOSIS — Z86010 Personal history of colonic polyps: Secondary | ICD-10-CM | POA: Diagnosis not present

## 2022-11-27 DIAGNOSIS — I69318 Other symptoms and signs involving cognitive functions following cerebral infarction: Secondary | ICD-10-CM | POA: Diagnosis not present

## 2022-11-27 DIAGNOSIS — Z86018 Personal history of other benign neoplasm: Secondary | ICD-10-CM | POA: Diagnosis not present

## 2022-11-27 DIAGNOSIS — Z7982 Long term (current) use of aspirin: Secondary | ICD-10-CM | POA: Diagnosis not present

## 2022-11-27 DIAGNOSIS — F17210 Nicotine dependence, cigarettes, uncomplicated: Secondary | ICD-10-CM | POA: Diagnosis not present

## 2022-11-27 DIAGNOSIS — I6522 Occlusion and stenosis of left carotid artery: Secondary | ICD-10-CM | POA: Diagnosis not present

## 2022-11-27 DIAGNOSIS — D509 Iron deficiency anemia, unspecified: Secondary | ICD-10-CM | POA: Diagnosis not present

## 2022-11-27 DIAGNOSIS — I69351 Hemiplegia and hemiparesis following cerebral infarction affecting right dominant side: Secondary | ICD-10-CM | POA: Diagnosis not present

## 2022-11-27 DIAGNOSIS — N1832 Chronic kidney disease, stage 3b: Secondary | ICD-10-CM | POA: Diagnosis not present

## 2022-11-27 DIAGNOSIS — M109 Gout, unspecified: Secondary | ICD-10-CM | POA: Diagnosis not present

## 2022-11-27 DIAGNOSIS — I69398 Other sequelae of cerebral infarction: Secondary | ICD-10-CM | POA: Diagnosis not present

## 2022-11-27 DIAGNOSIS — I130 Hypertensive heart and chronic kidney disease with heart failure and stage 1 through stage 4 chronic kidney disease, or unspecified chronic kidney disease: Secondary | ICD-10-CM | POA: Diagnosis not present

## 2022-11-27 DIAGNOSIS — G9341 Metabolic encephalopathy: Secondary | ICD-10-CM | POA: Diagnosis not present

## 2022-11-28 DIAGNOSIS — R54 Age-related physical debility: Secondary | ICD-10-CM | POA: Diagnosis not present

## 2022-11-28 DIAGNOSIS — I10 Essential (primary) hypertension: Secondary | ICD-10-CM | POA: Diagnosis not present

## 2022-12-01 ENCOUNTER — Telehealth: Payer: Self-pay | Admitting: *Deleted

## 2022-12-01 NOTE — Progress Notes (Signed)
  Care Coordination Note  12/01/2022 Name: [name] MRN: [id number] DOB: November 19, 1945  Andres White is a 77 y.o. year old male who is a primary care patient of Scism, Courtney Paris, FNP and is actively engaged with the care management team. I reached out to Andres White by phone today to assist with re-scheduling a follow up visit with the RN Case Manager  Follow up plan: Per daughter pt switched pcp's and no longer with Dayton Scrape at [facility] is with [facility] and has home health as well - services no longer needed at this time   Burman Nieves, [facility] Care Coordination Care Guide Direct Dial: [phone]

## 2022-12-02 ENCOUNTER — Ambulatory Visit: Payer: Medicare Other | Admitting: Adult Health

## 2022-12-02 ENCOUNTER — Ambulatory Visit: Payer: Self-pay | Admitting: Licensed Clinical Social Worker

## 2022-12-02 DIAGNOSIS — G629 Polyneuropathy, unspecified: Secondary | ICD-10-CM | POA: Diagnosis not present

## 2022-12-02 DIAGNOSIS — I69351 Hemiplegia and hemiparesis following cerebral infarction affecting right dominant side: Secondary | ICD-10-CM | POA: Diagnosis not present

## 2022-12-02 DIAGNOSIS — K2950 Unspecified chronic gastritis without bleeding: Secondary | ICD-10-CM | POA: Diagnosis not present

## 2022-12-02 DIAGNOSIS — I5032 Chronic diastolic (congestive) heart failure: Secondary | ICD-10-CM | POA: Diagnosis not present

## 2022-12-02 DIAGNOSIS — I130 Hypertensive heart and chronic kidney disease with heart failure and stage 1 through stage 4 chronic kidney disease, or unspecified chronic kidney disease: Secondary | ICD-10-CM | POA: Diagnosis not present

## 2022-12-02 DIAGNOSIS — M47812 Spondylosis without myelopathy or radiculopathy, cervical region: Secondary | ICD-10-CM | POA: Diagnosis not present

## 2022-12-02 DIAGNOSIS — Z7982 Long term (current) use of aspirin: Secondary | ICD-10-CM | POA: Diagnosis not present

## 2022-12-02 DIAGNOSIS — N1832 Chronic kidney disease, stage 3b: Secondary | ICD-10-CM | POA: Diagnosis not present

## 2022-12-02 DIAGNOSIS — M109 Gout, unspecified: Secondary | ICD-10-CM | POA: Diagnosis not present

## 2022-12-02 DIAGNOSIS — I6522 Occlusion and stenosis of left carotid artery: Secondary | ICD-10-CM | POA: Diagnosis not present

## 2022-12-02 DIAGNOSIS — D509 Iron deficiency anemia, unspecified: Secondary | ICD-10-CM | POA: Diagnosis not present

## 2022-12-02 DIAGNOSIS — I69398 Other sequelae of cerebral infarction: Secondary | ICD-10-CM | POA: Diagnosis not present

## 2022-12-02 DIAGNOSIS — Z86010 Personal history of colonic polyps: Secondary | ICD-10-CM | POA: Diagnosis not present

## 2022-12-02 DIAGNOSIS — I69391 Dysphagia following cerebral infarction: Secondary | ICD-10-CM | POA: Diagnosis not present

## 2022-12-02 DIAGNOSIS — M1990 Unspecified osteoarthritis, unspecified site: Secondary | ICD-10-CM | POA: Diagnosis not present

## 2022-12-02 DIAGNOSIS — Z87440 Personal history of urinary (tract) infections: Secondary | ICD-10-CM | POA: Diagnosis not present

## 2022-12-02 DIAGNOSIS — G8929 Other chronic pain: Secondary | ICD-10-CM | POA: Diagnosis not present

## 2022-12-02 DIAGNOSIS — Z86018 Personal history of other benign neoplasm: Secondary | ICD-10-CM | POA: Diagnosis not present

## 2022-12-02 DIAGNOSIS — K219 Gastro-esophageal reflux disease without esophagitis: Secondary | ICD-10-CM | POA: Diagnosis not present

## 2022-12-02 DIAGNOSIS — I69318 Other symptoms and signs involving cognitive functions following cerebral infarction: Secondary | ICD-10-CM | POA: Diagnosis not present

## 2022-12-02 DIAGNOSIS — E785 Hyperlipidemia, unspecified: Secondary | ICD-10-CM | POA: Diagnosis not present

## 2022-12-02 DIAGNOSIS — G9341 Metabolic encephalopathy: Secondary | ICD-10-CM | POA: Diagnosis not present

## 2022-12-02 DIAGNOSIS — F17210 Nicotine dependence, cigarettes, uncomplicated: Secondary | ICD-10-CM | POA: Diagnosis not present

## 2022-12-02 NOTE — Patient Outreach (Signed)
  Care Coordination   Follow Up Visit Note   12/02/2022 Name: Andres White MRN: [id number] DOB: 1945-12-24  Andres White is a 77 y.o. year old male who sees Scism, Courtney Paris, [place] for primary care. I spoke with  Andres White / Andres White, daughter of client, via phone today   What matters to the patients health and wellness today? Client has support with [facility]. Andres White, daughter of client, does not think client needs Care  Coordination program support at present.     Goals Addressed             This Visit's Progress    Patient has support with [facility]. Andres White, daughter of client, did not think client needed Care Coordination program support at this time       Interventions Spoke with Andres White, daughter of client, about client needs Herbert Seta reported that client has support with [facility] . She did not think client needed Care coordination program support at this time Herbert Seta and client have LCSW number to call as needed.        SDOH assessments and interventions completed:  Yes     Care Coordination Interventions:  Yes, provided   Interventions Today    Flowsheet Row Most Recent Value  Chronic Disease   Chronic disease during today's visit Other  [spoke with Blima Ledger, daughter of client , about client needs]  General Interventions   General Interventions Discussed/Reviewed General Interventions Discussed, Community Resources  [reviewed program support]  Education Interventions   Education Provided Provided Education  Provided Engineer, petroleum On [employer]  Mental Health Interventions   Mental Health Discussed/Reviewed Anxiety, Coping Strategies  [Heather Wommack reported that client has support with [facility]. She said she did not think client needed Care Coordination program support at present]  Safety Interventions   Safety Discussed/Reviewed Fall Risk        Follow up plan: No further  interventions needed at present   Encounter Outcome:  Pt. Visit Completed   Kelton Pillar.Emmelina Mcloughlin MSW, LCSW Licensed Visual merchandiser [facility] Care Management [phone]

## 2022-12-02 NOTE — Patient Instructions (Signed)
Visit Information  Thank you for taking time to visit with me today. Please don't hesitate to contact me if I can be of assistance to you.   Following are the goals we discussed today:   Goals Addressed             This Visit's Progress    Patient has support with [facility]. [name], daughter of client, did not think client needed Care Coordination program support at this time       Interventions Spoke with [name], daughter of client, about client needs Herbert Seta reported that client has support with [facility] . She did not think client needed Care coordination program support at this time Herbert Seta and client have LCSW number to call as needed.       No further interventions are needed at present  Please call the care guide team at [phone] if you need to cancel or reschedule your appointment.   If you are experiencing a Mental Health or Behavioral Health Crisis or need someone to talk to, please go to [facility] [address] [place] [phone])   The patient/ [name], daughter,  verbalized understanding of instructions, educational materials, and care plan provided today and DECLINED offer to receive copy of patient instructions, educational materials, and care plan.   The patient / [name], daughter, has been provided with contact information for the care management team and has been advised to call with any health related questions or concerns.    Kelton Pillar.Juanya Villavicencio MSW, LCSW Licensed Visual merchandiser [facility] Care Management [phone]

## 2022-12-04 DIAGNOSIS — I69391 Dysphagia following cerebral infarction: Secondary | ICD-10-CM | POA: Diagnosis not present

## 2022-12-04 DIAGNOSIS — N1832 Chronic kidney disease, stage 3b: Secondary | ICD-10-CM | POA: Diagnosis not present

## 2022-12-04 DIAGNOSIS — M47812 Spondylosis without myelopathy or radiculopathy, cervical region: Secondary | ICD-10-CM | POA: Diagnosis not present

## 2022-12-04 DIAGNOSIS — Z86018 Personal history of other benign neoplasm: Secondary | ICD-10-CM | POA: Diagnosis not present

## 2022-12-04 DIAGNOSIS — Z7982 Long term (current) use of aspirin: Secondary | ICD-10-CM | POA: Diagnosis not present

## 2022-12-04 DIAGNOSIS — I69318 Other symptoms and signs involving cognitive functions following cerebral infarction: Secondary | ICD-10-CM | POA: Diagnosis not present

## 2022-12-04 DIAGNOSIS — M1990 Unspecified osteoarthritis, unspecified site: Secondary | ICD-10-CM | POA: Diagnosis not present

## 2022-12-04 DIAGNOSIS — I5032 Chronic diastolic (congestive) heart failure: Secondary | ICD-10-CM | POA: Diagnosis not present

## 2022-12-04 DIAGNOSIS — I69351 Hemiplegia and hemiparesis following cerebral infarction affecting right dominant side: Secondary | ICD-10-CM | POA: Diagnosis not present

## 2022-12-04 DIAGNOSIS — G9341 Metabolic encephalopathy: Secondary | ICD-10-CM | POA: Diagnosis not present

## 2022-12-04 DIAGNOSIS — Z87440 Personal history of urinary (tract) infections: Secondary | ICD-10-CM | POA: Diagnosis not present

## 2022-12-04 DIAGNOSIS — F17210 Nicotine dependence, cigarettes, uncomplicated: Secondary | ICD-10-CM | POA: Diagnosis not present

## 2022-12-04 DIAGNOSIS — I69398 Other sequelae of cerebral infarction: Secondary | ICD-10-CM | POA: Diagnosis not present

## 2022-12-04 DIAGNOSIS — G629 Polyneuropathy, unspecified: Secondary | ICD-10-CM | POA: Diagnosis not present

## 2022-12-04 DIAGNOSIS — D509 Iron deficiency anemia, unspecified: Secondary | ICD-10-CM | POA: Diagnosis not present

## 2022-12-04 DIAGNOSIS — Z86010 Personal history of colonic polyps: Secondary | ICD-10-CM | POA: Diagnosis not present

## 2022-12-04 DIAGNOSIS — M109 Gout, unspecified: Secondary | ICD-10-CM | POA: Diagnosis not present

## 2022-12-04 DIAGNOSIS — I130 Hypertensive heart and chronic kidney disease with heart failure and stage 1 through stage 4 chronic kidney disease, or unspecified chronic kidney disease: Secondary | ICD-10-CM | POA: Diagnosis not present

## 2022-12-04 DIAGNOSIS — G8929 Other chronic pain: Secondary | ICD-10-CM | POA: Diagnosis not present

## 2022-12-04 DIAGNOSIS — I6522 Occlusion and stenosis of left carotid artery: Secondary | ICD-10-CM | POA: Diagnosis not present

## 2022-12-04 DIAGNOSIS — K219 Gastro-esophageal reflux disease without esophagitis: Secondary | ICD-10-CM | POA: Diagnosis not present

## 2022-12-05 ENCOUNTER — Ambulatory Visit: Payer: Self-pay

## 2022-12-05 DIAGNOSIS — M6281 Muscle weakness (generalized): Secondary | ICD-10-CM | POA: Diagnosis not present

## 2022-12-05 NOTE — Patient Outreach (Signed)
  Care Coordination   Case Closure  Visit Note   12/05/2022 Name: Andres White MRN: [id number] DOB: Nov 02, 1945  Andres White is a 77 y.o. year old male who sees Sc[place], Courtney [place], [place] for primary care.   RNCM received information from Care Guide and LCSW that patient's care is now managed by [facility] and [facility] Care coordination services no longer needed. Goals completed.  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 Goals completed.]           SDOH assessments and interventions completed:  No  Care Coordination Interventions:  No, not indicated   Follow up plan: No further intervention required.   Encounter Outcome:  Pt. Visit Completed   Kathyrn Sheriff, RN, MSN, BSN, CCM [facility] Care Coordinator [phone]

## 2022-12-09 DIAGNOSIS — I6522 Occlusion and stenosis of left carotid artery: Secondary | ICD-10-CM | POA: Diagnosis not present

## 2022-12-09 DIAGNOSIS — I69398 Other sequelae of cerebral infarction: Secondary | ICD-10-CM | POA: Diagnosis not present

## 2022-12-09 DIAGNOSIS — F17210 Nicotine dependence, cigarettes, uncomplicated: Secondary | ICD-10-CM | POA: Diagnosis not present

## 2022-12-09 DIAGNOSIS — I5032 Chronic diastolic (congestive) heart failure: Secondary | ICD-10-CM | POA: Diagnosis not present

## 2022-12-09 DIAGNOSIS — I130 Hypertensive heart and chronic kidney disease with heart failure and stage 1 through stage 4 chronic kidney disease, or unspecified chronic kidney disease: Secondary | ICD-10-CM | POA: Diagnosis not present

## 2022-12-09 DIAGNOSIS — M109 Gout, unspecified: Secondary | ICD-10-CM | POA: Diagnosis not present

## 2022-12-09 DIAGNOSIS — G629 Polyneuropathy, unspecified: Secondary | ICD-10-CM | POA: Diagnosis not present

## 2022-12-09 DIAGNOSIS — I69318 Other symptoms and signs involving cognitive functions following cerebral infarction: Secondary | ICD-10-CM | POA: Diagnosis not present

## 2022-12-09 DIAGNOSIS — D509 Iron deficiency anemia, unspecified: Secondary | ICD-10-CM | POA: Diagnosis not present

## 2022-12-09 DIAGNOSIS — G9341 Metabolic encephalopathy: Secondary | ICD-10-CM | POA: Diagnosis not present

## 2022-12-09 DIAGNOSIS — K219 Gastro-esophageal reflux disease without esophagitis: Secondary | ICD-10-CM | POA: Diagnosis not present

## 2022-12-09 DIAGNOSIS — G8929 Other chronic pain: Secondary | ICD-10-CM | POA: Diagnosis not present

## 2022-12-09 DIAGNOSIS — Z86018 Personal history of other benign neoplasm: Secondary | ICD-10-CM | POA: Diagnosis not present

## 2022-12-09 DIAGNOSIS — N1832 Chronic kidney disease, stage 3b: Secondary | ICD-10-CM | POA: Diagnosis not present

## 2022-12-09 DIAGNOSIS — Z86010 Personal history of colonic polyps: Secondary | ICD-10-CM | POA: Diagnosis not present

## 2022-12-09 DIAGNOSIS — M1990 Unspecified osteoarthritis, unspecified site: Secondary | ICD-10-CM | POA: Diagnosis not present

## 2022-12-09 DIAGNOSIS — I69391 Dysphagia following cerebral infarction: Secondary | ICD-10-CM | POA: Diagnosis not present

## 2022-12-09 DIAGNOSIS — Z87440 Personal history of urinary (tract) infections: Secondary | ICD-10-CM | POA: Diagnosis not present

## 2022-12-09 DIAGNOSIS — Z7982 Long term (current) use of aspirin: Secondary | ICD-10-CM | POA: Diagnosis not present

## 2022-12-09 DIAGNOSIS — M47812 Spondylosis without myelopathy or radiculopathy, cervical region: Secondary | ICD-10-CM | POA: Diagnosis not present

## 2022-12-09 DIAGNOSIS — I69351 Hemiplegia and hemiparesis following cerebral infarction affecting right dominant side: Secondary | ICD-10-CM | POA: Diagnosis not present

## 2022-12-11 DIAGNOSIS — M109 Gout, unspecified: Secondary | ICD-10-CM | POA: Diagnosis not present

## 2022-12-11 DIAGNOSIS — G8929 Other chronic pain: Secondary | ICD-10-CM | POA: Diagnosis not present

## 2022-12-11 DIAGNOSIS — M47812 Spondylosis without myelopathy or radiculopathy, cervical region: Secondary | ICD-10-CM | POA: Diagnosis not present

## 2022-12-11 DIAGNOSIS — Z86010 Personal history of colonic polyps: Secondary | ICD-10-CM | POA: Diagnosis not present

## 2022-12-11 DIAGNOSIS — F17210 Nicotine dependence, cigarettes, uncomplicated: Secondary | ICD-10-CM | POA: Diagnosis not present

## 2022-12-11 DIAGNOSIS — I69318 Other symptoms and signs involving cognitive functions following cerebral infarction: Secondary | ICD-10-CM | POA: Diagnosis not present

## 2022-12-11 DIAGNOSIS — I130 Hypertensive heart and chronic kidney disease with heart failure and stage 1 through stage 4 chronic kidney disease, or unspecified chronic kidney disease: Secondary | ICD-10-CM | POA: Diagnosis not present

## 2022-12-11 DIAGNOSIS — G9341 Metabolic encephalopathy: Secondary | ICD-10-CM | POA: Diagnosis not present

## 2022-12-11 DIAGNOSIS — G629 Polyneuropathy, unspecified: Secondary | ICD-10-CM | POA: Diagnosis not present

## 2022-12-11 DIAGNOSIS — I5032 Chronic diastolic (congestive) heart failure: Secondary | ICD-10-CM | POA: Diagnosis not present

## 2022-12-11 DIAGNOSIS — K219 Gastro-esophageal reflux disease without esophagitis: Secondary | ICD-10-CM | POA: Diagnosis not present

## 2022-12-11 DIAGNOSIS — Z86018 Personal history of other benign neoplasm: Secondary | ICD-10-CM | POA: Diagnosis not present

## 2022-12-11 DIAGNOSIS — I69398 Other sequelae of cerebral infarction: Secondary | ICD-10-CM | POA: Diagnosis not present

## 2022-12-11 DIAGNOSIS — D509 Iron deficiency anemia, unspecified: Secondary | ICD-10-CM | POA: Diagnosis not present

## 2022-12-11 DIAGNOSIS — I69391 Dysphagia following cerebral infarction: Secondary | ICD-10-CM | POA: Diagnosis not present

## 2022-12-11 DIAGNOSIS — M1990 Unspecified osteoarthritis, unspecified site: Secondary | ICD-10-CM | POA: Diagnosis not present

## 2022-12-11 DIAGNOSIS — N1832 Chronic kidney disease, stage 3b: Secondary | ICD-10-CM | POA: Diagnosis not present

## 2022-12-11 DIAGNOSIS — I69351 Hemiplegia and hemiparesis following cerebral infarction affecting right dominant side: Secondary | ICD-10-CM | POA: Diagnosis not present

## 2022-12-11 DIAGNOSIS — Z87440 Personal history of urinary (tract) infections: Secondary | ICD-10-CM | POA: Diagnosis not present

## 2022-12-11 DIAGNOSIS — Z7982 Long term (current) use of aspirin: Secondary | ICD-10-CM | POA: Diagnosis not present

## 2022-12-11 DIAGNOSIS — I6522 Occlusion and stenosis of left carotid artery: Secondary | ICD-10-CM | POA: Diagnosis not present

## 2022-12-15 DIAGNOSIS — I5032 Chronic diastolic (congestive) heart failure: Secondary | ICD-10-CM | POA: Diagnosis not present

## 2022-12-15 DIAGNOSIS — G629 Polyneuropathy, unspecified: Secondary | ICD-10-CM | POA: Diagnosis not present

## 2022-12-15 DIAGNOSIS — Z87440 Personal history of urinary (tract) infections: Secondary | ICD-10-CM | POA: Diagnosis not present

## 2022-12-15 DIAGNOSIS — I130 Hypertensive heart and chronic kidney disease with heart failure and stage 1 through stage 4 chronic kidney disease, or unspecified chronic kidney disease: Secondary | ICD-10-CM | POA: Diagnosis not present

## 2022-12-15 DIAGNOSIS — G8929 Other chronic pain: Secondary | ICD-10-CM | POA: Diagnosis not present

## 2022-12-15 DIAGNOSIS — Z86010 Personal history of colonic polyps: Secondary | ICD-10-CM | POA: Diagnosis not present

## 2022-12-15 DIAGNOSIS — I69391 Dysphagia following cerebral infarction: Secondary | ICD-10-CM | POA: Diagnosis not present

## 2022-12-15 DIAGNOSIS — I69351 Hemiplegia and hemiparesis following cerebral infarction affecting right dominant side: Secondary | ICD-10-CM | POA: Diagnosis not present

## 2022-12-15 DIAGNOSIS — I6522 Occlusion and stenosis of left carotid artery: Secondary | ICD-10-CM | POA: Diagnosis not present

## 2022-12-15 DIAGNOSIS — Z7982 Long term (current) use of aspirin: Secondary | ICD-10-CM | POA: Diagnosis not present

## 2022-12-15 DIAGNOSIS — Z86018 Personal history of other benign neoplasm: Secondary | ICD-10-CM | POA: Diagnosis not present

## 2022-12-15 DIAGNOSIS — K219 Gastro-esophageal reflux disease without esophagitis: Secondary | ICD-10-CM | POA: Diagnosis not present

## 2022-12-15 DIAGNOSIS — N1832 Chronic kidney disease, stage 3b: Secondary | ICD-10-CM | POA: Diagnosis not present

## 2022-12-15 DIAGNOSIS — D509 Iron deficiency anemia, unspecified: Secondary | ICD-10-CM | POA: Diagnosis not present

## 2022-12-15 DIAGNOSIS — G9341 Metabolic encephalopathy: Secondary | ICD-10-CM | POA: Diagnosis not present

## 2022-12-15 DIAGNOSIS — F17210 Nicotine dependence, cigarettes, uncomplicated: Secondary | ICD-10-CM | POA: Diagnosis not present

## 2022-12-15 DIAGNOSIS — M109 Gout, unspecified: Secondary | ICD-10-CM | POA: Diagnosis not present

## 2022-12-15 DIAGNOSIS — M1990 Unspecified osteoarthritis, unspecified site: Secondary | ICD-10-CM | POA: Diagnosis not present

## 2022-12-15 DIAGNOSIS — I69318 Other symptoms and signs involving cognitive functions following cerebral infarction: Secondary | ICD-10-CM | POA: Diagnosis not present

## 2022-12-15 DIAGNOSIS — M47812 Spondylosis without myelopathy or radiculopathy, cervical region: Secondary | ICD-10-CM | POA: Diagnosis not present

## 2022-12-15 DIAGNOSIS — I69398 Other sequelae of cerebral infarction: Secondary | ICD-10-CM | POA: Diagnosis not present

## 2022-12-18 DIAGNOSIS — Z7982 Long term (current) use of aspirin: Secondary | ICD-10-CM | POA: Diagnosis not present

## 2022-12-18 DIAGNOSIS — G629 Polyneuropathy, unspecified: Secondary | ICD-10-CM | POA: Diagnosis not present

## 2022-12-18 DIAGNOSIS — K219 Gastro-esophageal reflux disease without esophagitis: Secondary | ICD-10-CM | POA: Diagnosis not present

## 2022-12-18 DIAGNOSIS — G8929 Other chronic pain: Secondary | ICD-10-CM | POA: Diagnosis not present

## 2022-12-18 DIAGNOSIS — F17210 Nicotine dependence, cigarettes, uncomplicated: Secondary | ICD-10-CM | POA: Diagnosis not present

## 2022-12-18 DIAGNOSIS — D509 Iron deficiency anemia, unspecified: Secondary | ICD-10-CM | POA: Diagnosis not present

## 2022-12-18 DIAGNOSIS — Z87440 Personal history of urinary (tract) infections: Secondary | ICD-10-CM | POA: Diagnosis not present

## 2022-12-18 DIAGNOSIS — I130 Hypertensive heart and chronic kidney disease with heart failure and stage 1 through stage 4 chronic kidney disease, or unspecified chronic kidney disease: Secondary | ICD-10-CM | POA: Diagnosis not present

## 2022-12-18 DIAGNOSIS — I5032 Chronic diastolic (congestive) heart failure: Secondary | ICD-10-CM | POA: Diagnosis not present

## 2022-12-18 DIAGNOSIS — N1832 Chronic kidney disease, stage 3b: Secondary | ICD-10-CM | POA: Diagnosis not present

## 2022-12-18 DIAGNOSIS — I69398 Other sequelae of cerebral infarction: Secondary | ICD-10-CM | POA: Diagnosis not present

## 2022-12-18 DIAGNOSIS — M109 Gout, unspecified: Secondary | ICD-10-CM | POA: Diagnosis not present

## 2022-12-18 DIAGNOSIS — Z86010 Personal history of colonic polyps: Secondary | ICD-10-CM | POA: Diagnosis not present

## 2022-12-18 DIAGNOSIS — M1990 Unspecified osteoarthritis, unspecified site: Secondary | ICD-10-CM | POA: Diagnosis not present

## 2022-12-18 DIAGNOSIS — M47812 Spondylosis without myelopathy or radiculopathy, cervical region: Secondary | ICD-10-CM | POA: Diagnosis not present

## 2022-12-18 DIAGNOSIS — Z86018 Personal history of other benign neoplasm: Secondary | ICD-10-CM | POA: Diagnosis not present

## 2022-12-18 DIAGNOSIS — I69391 Dysphagia following cerebral infarction: Secondary | ICD-10-CM | POA: Diagnosis not present

## 2022-12-18 DIAGNOSIS — I6522 Occlusion and stenosis of left carotid artery: Secondary | ICD-10-CM | POA: Diagnosis not present

## 2022-12-18 DIAGNOSIS — I69351 Hemiplegia and hemiparesis following cerebral infarction affecting right dominant side: Secondary | ICD-10-CM | POA: Diagnosis not present

## 2022-12-18 DIAGNOSIS — I69318 Other symptoms and signs involving cognitive functions following cerebral infarction: Secondary | ICD-10-CM | POA: Diagnosis not present

## 2022-12-18 DIAGNOSIS — G9341 Metabolic encephalopathy: Secondary | ICD-10-CM | POA: Diagnosis not present

## 2022-12-23 DIAGNOSIS — I5032 Chronic diastolic (congestive) heart failure: Secondary | ICD-10-CM | POA: Diagnosis not present

## 2022-12-23 DIAGNOSIS — M47812 Spondylosis without myelopathy or radiculopathy, cervical region: Secondary | ICD-10-CM | POA: Diagnosis not present

## 2022-12-23 DIAGNOSIS — Z7982 Long term (current) use of aspirin: Secondary | ICD-10-CM | POA: Diagnosis not present

## 2022-12-23 DIAGNOSIS — I69318 Other symptoms and signs involving cognitive functions following cerebral infarction: Secondary | ICD-10-CM | POA: Diagnosis not present

## 2022-12-23 DIAGNOSIS — Z87440 Personal history of urinary (tract) infections: Secondary | ICD-10-CM | POA: Diagnosis not present

## 2022-12-23 DIAGNOSIS — Z86010 Personal history of colonic polyps: Secondary | ICD-10-CM | POA: Diagnosis not present

## 2022-12-23 DIAGNOSIS — F17210 Nicotine dependence, cigarettes, uncomplicated: Secondary | ICD-10-CM | POA: Diagnosis not present

## 2022-12-23 DIAGNOSIS — D509 Iron deficiency anemia, unspecified: Secondary | ICD-10-CM | POA: Diagnosis not present

## 2022-12-23 DIAGNOSIS — I69398 Other sequelae of cerebral infarction: Secondary | ICD-10-CM | POA: Diagnosis not present

## 2022-12-23 DIAGNOSIS — G9341 Metabolic encephalopathy: Secondary | ICD-10-CM | POA: Diagnosis not present

## 2022-12-23 DIAGNOSIS — I69391 Dysphagia following cerebral infarction: Secondary | ICD-10-CM | POA: Diagnosis not present

## 2022-12-23 DIAGNOSIS — I6522 Occlusion and stenosis of left carotid artery: Secondary | ICD-10-CM | POA: Diagnosis not present

## 2022-12-23 DIAGNOSIS — K219 Gastro-esophageal reflux disease without esophagitis: Secondary | ICD-10-CM | POA: Diagnosis not present

## 2022-12-23 DIAGNOSIS — N1832 Chronic kidney disease, stage 3b: Secondary | ICD-10-CM | POA: Diagnosis not present

## 2022-12-23 DIAGNOSIS — I69351 Hemiplegia and hemiparesis following cerebral infarction affecting right dominant side: Secondary | ICD-10-CM | POA: Diagnosis not present

## 2022-12-23 DIAGNOSIS — M109 Gout, unspecified: Secondary | ICD-10-CM | POA: Diagnosis not present

## 2022-12-23 DIAGNOSIS — G629 Polyneuropathy, unspecified: Secondary | ICD-10-CM | POA: Diagnosis not present

## 2022-12-23 DIAGNOSIS — G8929 Other chronic pain: Secondary | ICD-10-CM | POA: Diagnosis not present

## 2022-12-23 DIAGNOSIS — I130 Hypertensive heart and chronic kidney disease with heart failure and stage 1 through stage 4 chronic kidney disease, or unspecified chronic kidney disease: Secondary | ICD-10-CM | POA: Diagnosis not present

## 2022-12-23 DIAGNOSIS — M1990 Unspecified osteoarthritis, unspecified site: Secondary | ICD-10-CM | POA: Diagnosis not present

## 2022-12-23 DIAGNOSIS — Z86018 Personal history of other benign neoplasm: Secondary | ICD-10-CM | POA: Diagnosis not present

## 2022-12-30 ENCOUNTER — Inpatient Hospital Stay (HOSPITAL_COMMUNITY)
Admission: EM | Admit: 2022-12-30 | Discharge: 2023-01-05 | DRG: 064 | Disposition: A | Discharge status: Home / Self Care | Payer: Medicare Other | Attending: Internal Medicine | Admitting: Internal Medicine

## 2022-12-30 ENCOUNTER — Emergency Department (HOSPITAL_COMMUNITY): Payer: Medicare Other

## 2022-12-30 ENCOUNTER — Encounter (HOSPITAL_COMMUNITY): Payer: Self-pay

## 2022-12-30 ENCOUNTER — Other Ambulatory Visit: Payer: Self-pay

## 2022-12-30 DIAGNOSIS — R531 Weakness: Secondary | ICD-10-CM | POA: Diagnosis not present

## 2022-12-30 DIAGNOSIS — F17210 Nicotine dependence, cigarettes, uncomplicated: Secondary | ICD-10-CM | POA: Diagnosis not present

## 2022-12-30 DIAGNOSIS — R2990 Unspecified symptoms and signs involving the nervous system: Secondary | ICD-10-CM | POA: Diagnosis present

## 2022-12-30 DIAGNOSIS — I69351 Hemiplegia and hemiparesis following cerebral infarction affecting right dominant side: Secondary | ICD-10-CM

## 2022-12-30 DIAGNOSIS — R6889 Other general symptoms and signs: Secondary | ICD-10-CM | POA: Diagnosis not present

## 2022-12-30 DIAGNOSIS — N179 Acute kidney failure, unspecified: Secondary | ICD-10-CM | POA: Diagnosis present

## 2022-12-30 DIAGNOSIS — I2510 Atherosclerotic heart disease of native coronary artery without angina pectoris: Secondary | ICD-10-CM | POA: Diagnosis not present

## 2022-12-30 DIAGNOSIS — E876 Hypokalemia: Secondary | ICD-10-CM | POA: Diagnosis present

## 2022-12-30 DIAGNOSIS — M47812 Spondylosis without myelopathy or radiculopathy, cervical region: Secondary | ICD-10-CM | POA: Diagnosis not present

## 2022-12-30 DIAGNOSIS — I5032 Chronic diastolic (congestive) heart failure: Secondary | ICD-10-CM | POA: Diagnosis not present

## 2022-12-30 DIAGNOSIS — I739 Peripheral vascular disease, unspecified: Secondary | ICD-10-CM | POA: Diagnosis not present

## 2022-12-30 DIAGNOSIS — Z86010 Personal history of colonic polyps: Secondary | ICD-10-CM | POA: Diagnosis not present

## 2022-12-30 DIAGNOSIS — I671 Cerebral aneurysm, nonruptured: Secondary | ICD-10-CM | POA: Diagnosis present

## 2022-12-30 DIAGNOSIS — R110 Nausea: Secondary | ICD-10-CM | POA: Diagnosis not present

## 2022-12-30 DIAGNOSIS — F17200 Nicotine dependence, unspecified, uncomplicated: Secondary | ICD-10-CM | POA: Diagnosis not present

## 2022-12-30 DIAGNOSIS — R278 Other lack of coordination: Secondary | ICD-10-CM | POA: Diagnosis present

## 2022-12-30 DIAGNOSIS — I959 Hypotension, unspecified: Secondary | ICD-10-CM | POA: Diagnosis not present

## 2022-12-30 DIAGNOSIS — N1832 Chronic kidney disease, stage 3b: Secondary | ICD-10-CM | POA: Diagnosis present

## 2022-12-30 DIAGNOSIS — R410 Disorientation, unspecified: Secondary | ICD-10-CM | POA: Diagnosis not present

## 2022-12-30 DIAGNOSIS — F0390 Unspecified dementia without behavioral disturbance: Secondary | ICD-10-CM | POA: Diagnosis present

## 2022-12-30 DIAGNOSIS — I6522 Occlusion and stenosis of left carotid artery: Secondary | ICD-10-CM | POA: Diagnosis present

## 2022-12-30 DIAGNOSIS — I472 Ventricular tachycardia, unspecified: Secondary | ICD-10-CM | POA: Diagnosis present

## 2022-12-30 DIAGNOSIS — I130 Hypertensive heart and chronic kidney disease with heart failure and stage 1 through stage 4 chronic kidney disease, or unspecified chronic kidney disease: Secondary | ICD-10-CM | POA: Diagnosis present

## 2022-12-30 DIAGNOSIS — D509 Iron deficiency anemia, unspecified: Secondary | ICD-10-CM | POA: Diagnosis not present

## 2022-12-30 DIAGNOSIS — N400 Enlarged prostate without lower urinary tract symptoms: Secondary | ICD-10-CM | POA: Diagnosis present

## 2022-12-30 DIAGNOSIS — Z87440 Personal history of urinary (tract) infections: Secondary | ICD-10-CM | POA: Diagnosis not present

## 2022-12-30 DIAGNOSIS — I6381 Other cerebral infarction due to occlusion or stenosis of small artery: Principal | ICD-10-CM | POA: Diagnosis present

## 2022-12-30 DIAGNOSIS — K2951 Unspecified chronic gastritis with bleeding: Secondary | ICD-10-CM | POA: Diagnosis not present

## 2022-12-30 DIAGNOSIS — E785 Hyperlipidemia, unspecified: Secondary | ICD-10-CM | POA: Diagnosis not present

## 2022-12-30 DIAGNOSIS — G9340 Encephalopathy, unspecified: Secondary | ICD-10-CM | POA: Diagnosis not present

## 2022-12-30 DIAGNOSIS — K3189 Other diseases of stomach and duodenum: Secondary | ICD-10-CM | POA: Diagnosis present

## 2022-12-30 DIAGNOSIS — Z743 Need for continuous supervision: Secondary | ICD-10-CM | POA: Diagnosis not present

## 2022-12-30 DIAGNOSIS — D132 Benign neoplasm of duodenum: Secondary | ICD-10-CM | POA: Diagnosis present

## 2022-12-30 DIAGNOSIS — R9431 Abnormal electrocardiogram [ECG] [EKG]: Secondary | ICD-10-CM | POA: Diagnosis present

## 2022-12-30 DIAGNOSIS — Z79899 Other long term (current) drug therapy: Secondary | ICD-10-CM

## 2022-12-30 DIAGNOSIS — I493 Ventricular premature depolarization: Secondary | ICD-10-CM | POA: Diagnosis present

## 2022-12-30 DIAGNOSIS — L97519 Non-pressure chronic ulcer of other part of right foot with unspecified severity: Secondary | ICD-10-CM | POA: Diagnosis present

## 2022-12-30 DIAGNOSIS — D62 Acute posthemorrhagic anemia: Secondary | ICD-10-CM | POA: Diagnosis present

## 2022-12-30 DIAGNOSIS — M109 Gout, unspecified: Secondary | ICD-10-CM | POA: Diagnosis not present

## 2022-12-30 DIAGNOSIS — K31811 Angiodysplasia of stomach and duodenum with bleeding: Secondary | ICD-10-CM | POA: Diagnosis not present

## 2022-12-30 DIAGNOSIS — D649 Anemia, unspecified: Secondary | ICD-10-CM | POA: Diagnosis not present

## 2022-12-30 DIAGNOSIS — R54 Age-related physical debility: Secondary | ICD-10-CM | POA: Diagnosis not present

## 2022-12-30 DIAGNOSIS — I63233 Cerebral infarction due to unspecified occlusion or stenosis of bilateral carotid arteries: Secondary | ICD-10-CM | POA: Diagnosis not present

## 2022-12-30 DIAGNOSIS — D696 Thrombocytopenia, unspecified: Secondary | ICD-10-CM | POA: Diagnosis not present

## 2022-12-30 DIAGNOSIS — M1990 Unspecified osteoarthritis, unspecified site: Secondary | ICD-10-CM | POA: Diagnosis not present

## 2022-12-30 DIAGNOSIS — U071 COVID-19: Secondary | ICD-10-CM | POA: Diagnosis not present

## 2022-12-30 DIAGNOSIS — I639 Cerebral infarction, unspecified: Secondary | ICD-10-CM | POA: Diagnosis present

## 2022-12-30 DIAGNOSIS — G8929 Other chronic pain: Secondary | ICD-10-CM | POA: Diagnosis not present

## 2022-12-30 DIAGNOSIS — R4182 Altered mental status, unspecified: Secondary | ICD-10-CM | POA: Diagnosis not present

## 2022-12-30 DIAGNOSIS — E860 Dehydration: Secondary | ICD-10-CM | POA: Diagnosis not present

## 2022-12-30 DIAGNOSIS — Z8249 Family history of ischemic heart disease and other diseases of the circulatory system: Secondary | ICD-10-CM

## 2022-12-30 DIAGNOSIS — R5383 Other fatigue: Secondary | ICD-10-CM

## 2022-12-30 DIAGNOSIS — Z7982 Long term (current) use of aspirin: Secondary | ICD-10-CM | POA: Diagnosis not present

## 2022-12-30 DIAGNOSIS — K317 Polyp of stomach and duodenum: Secondary | ICD-10-CM | POA: Diagnosis not present

## 2022-12-30 DIAGNOSIS — M6281 Muscle weakness (generalized): Secondary | ICD-10-CM | POA: Diagnosis not present

## 2022-12-30 DIAGNOSIS — I4819 Other persistent atrial fibrillation: Secondary | ICD-10-CM | POA: Diagnosis not present

## 2022-12-30 DIAGNOSIS — Z993 Dependence on wheelchair: Secondary | ICD-10-CM

## 2022-12-30 DIAGNOSIS — G629 Polyneuropathy, unspecified: Secondary | ICD-10-CM | POA: Diagnosis not present

## 2022-12-30 DIAGNOSIS — I6302 Cerebral infarction due to thrombosis of basilar artery: Secondary | ICD-10-CM | POA: Diagnosis not present

## 2022-12-30 DIAGNOSIS — I4949 Other premature depolarization: Secondary | ICD-10-CM | POA: Diagnosis not present

## 2022-12-30 DIAGNOSIS — I69318 Other symptoms and signs involving cognitive functions following cerebral infarction: Secondary | ICD-10-CM | POA: Diagnosis not present

## 2022-12-30 DIAGNOSIS — N3000 Acute cystitis without hematuria: Secondary | ICD-10-CM | POA: Diagnosis not present

## 2022-12-30 DIAGNOSIS — K449 Diaphragmatic hernia without obstruction or gangrene: Secondary | ICD-10-CM | POA: Diagnosis present

## 2022-12-30 DIAGNOSIS — Z7189 Other specified counseling: Secondary | ICD-10-CM | POA: Diagnosis not present

## 2022-12-30 DIAGNOSIS — G40909 Epilepsy, unspecified, not intractable, without status epilepticus: Secondary | ICD-10-CM | POA: Diagnosis present

## 2022-12-30 DIAGNOSIS — N390 Urinary tract infection, site not specified: Secondary | ICD-10-CM | POA: Diagnosis present

## 2022-12-30 DIAGNOSIS — I69398 Other sequelae of cerebral infarction: Secondary | ICD-10-CM | POA: Diagnosis not present

## 2022-12-30 DIAGNOSIS — G9341 Metabolic encephalopathy: Secondary | ICD-10-CM | POA: Diagnosis not present

## 2022-12-30 DIAGNOSIS — R338 Other retention of urine: Secondary | ICD-10-CM | POA: Diagnosis present

## 2022-12-30 DIAGNOSIS — R29706 NIHSS score 6: Secondary | ICD-10-CM | POA: Diagnosis present

## 2022-12-30 DIAGNOSIS — N183 Chronic kidney disease, stage 3 unspecified: Secondary | ICD-10-CM | POA: Diagnosis present

## 2022-12-30 DIAGNOSIS — Z515 Encounter for palliative care: Secondary | ICD-10-CM

## 2022-12-30 DIAGNOSIS — I10 Essential (primary) hypertension: Secondary | ICD-10-CM | POA: Diagnosis not present

## 2022-12-30 DIAGNOSIS — Z86018 Personal history of other benign neoplasm: Secondary | ICD-10-CM | POA: Diagnosis not present

## 2022-12-30 DIAGNOSIS — R609 Edema, unspecified: Secondary | ICD-10-CM | POA: Diagnosis not present

## 2022-12-30 DIAGNOSIS — I69391 Dysphagia following cerebral infarction: Secondary | ICD-10-CM | POA: Diagnosis not present

## 2022-12-30 DIAGNOSIS — Z8719 Personal history of other diseases of the digestive system: Secondary | ICD-10-CM

## 2022-12-30 DIAGNOSIS — K2950 Unspecified chronic gastritis without bleeding: Secondary | ICD-10-CM | POA: Diagnosis not present

## 2022-12-30 DIAGNOSIS — I509 Heart failure, unspecified: Secondary | ICD-10-CM | POA: Diagnosis not present

## 2022-12-30 DIAGNOSIS — K31819 Angiodysplasia of stomach and duodenum without bleeding: Secondary | ICD-10-CM | POA: Diagnosis not present

## 2022-12-30 DIAGNOSIS — Z8669 Personal history of other diseases of the nervous system and sense organs: Secondary | ICD-10-CM

## 2022-12-30 DIAGNOSIS — K219 Gastro-esophageal reflux disease without esophagitis: Secondary | ICD-10-CM | POA: Diagnosis not present

## 2022-12-30 DIAGNOSIS — R001 Bradycardia, unspecified: Secondary | ICD-10-CM | POA: Diagnosis present

## 2022-12-30 DIAGNOSIS — I110 Hypertensive heart disease with heart failure: Secondary | ICD-10-CM | POA: Diagnosis not present

## 2022-12-30 DIAGNOSIS — I6990 Unspecified sequelae of unspecified cerebrovascular disease: Secondary | ICD-10-CM | POA: Diagnosis not present

## 2022-12-30 DIAGNOSIS — Z7902 Long term (current) use of antithrombotics/antiplatelets: Secondary | ICD-10-CM

## 2022-12-30 DIAGNOSIS — I6389 Other cerebral infarction: Secondary | ICD-10-CM | POA: Diagnosis not present

## 2022-12-30 DIAGNOSIS — I499 Cardiac arrhythmia, unspecified: Secondary | ICD-10-CM | POA: Diagnosis not present

## 2022-12-30 DIAGNOSIS — R195 Other fecal abnormalities: Secondary | ICD-10-CM

## 2022-12-30 DIAGNOSIS — R569 Unspecified convulsions: Secondary | ICD-10-CM | POA: Diagnosis not present

## 2022-12-30 LAB — TYPE AND SCREEN
ABO/RH(D): O POS
Unit division: 0

## 2022-12-30 LAB — URINALYSIS, W/ REFLEX TO CULTURE (INFECTION SUSPECTED)
Bilirubin Urine: NEGATIVE
Glucose, UA: NEGATIVE mg/dL
Ketones, ur: NEGATIVE mg/dL
Nitrite: NEGATIVE
Protein, ur: NEGATIVE mg/dL
Specific Gravity, Urine: 1.005 (ref 1.005–1.030)
pH: 6 (ref 5.0–8.0)

## 2022-12-30 LAB — BPAM RBC
Blood Product Expiration Date: 202406242359
Blood Product Expiration Date: 202406242359
ISSUE DATE / TIME: 202405282359

## 2022-12-30 LAB — CBC WITH DIFFERENTIAL/PLATELET
Abs Immature Granulocytes: 0.02 10*3/uL (ref 0.00–0.07)
Basophils Absolute: 0 10*3/uL (ref 0.0–0.1)
Basophils Relative: 0 %
Eosinophils Absolute: 0 10*3/uL (ref 0.0–0.5)
Eosinophils Relative: 0 %
HCT: 19.8 % — ABNORMAL LOW (ref 39.0–52.0)
Hemoglobin: 5.5 g/dL — CL (ref 13.0–17.0)
Immature Granulocytes: 0 %
Lymphocytes Relative: 36 %
Lymphs Abs: 1.9 10*3/uL (ref 0.7–4.0)
MCH: 20.7 pg — ABNORMAL LOW (ref 26.0–34.0)
MCHC: 27.8 g/dL — ABNORMAL LOW (ref 30.0–36.0)
MCV: 74.4 fL — ABNORMAL LOW (ref 80.0–100.0)
Monocytes Absolute: 0.3 10*3/uL (ref 0.1–1.0)
Monocytes Relative: 6 %
Neutro Abs: 3 10*3/uL (ref 1.7–7.7)
Neutrophils Relative %: 58 %
Platelets: 311 10*3/uL (ref 150–400)
RBC: 2.66 MIL/uL — ABNORMAL LOW (ref 4.22–5.81)
RDW: 19.9 % — ABNORMAL HIGH (ref 11.5–15.5)
WBC: 5.3 10*3/uL (ref 4.0–10.5)
nRBC: 0 % (ref 0.0–0.2)

## 2022-12-30 LAB — ABO/RH: ABO/RH(D): O POS

## 2022-12-30 LAB — COMPREHENSIVE METABOLIC PANEL
ALT: 17 U/L (ref 0–44)
AST: 35 U/L (ref 15–41)
Albumin: 2.1 g/dL — ABNORMAL LOW (ref 3.5–5.0)
Alkaline Phosphatase: 68 U/L (ref 38–126)
Anion gap: 9 (ref 5–15)
BUN: 11 mg/dL (ref 8–23)
CO2: 22 mmol/L (ref 22–32)
Calcium: 7.8 mg/dL — ABNORMAL LOW (ref 8.9–10.3)
Chloride: 104 mmol/L (ref 98–111)
Creatinine, Ser: 1.68 mg/dL — ABNORMAL HIGH (ref 0.61–1.24)
GFR, Estimated: 41 mL/min — ABNORMAL LOW (ref >60)
Glucose, Bld: 112 mg/dL — ABNORMAL HIGH (ref 70–99)
Potassium: 2.2 mmol/L — CL (ref 3.5–5.1)
Sodium: 135 mmol/L (ref 135–145)
Total Bilirubin: 0.5 mg/dL (ref 0.3–1.2)
Total Protein: 6.6 g/dL (ref 6.5–8.1)

## 2022-12-30 LAB — TROPONIN I (HIGH SENSITIVITY): Troponin I (High Sensitivity): 18 ng/L — ABNORMAL HIGH (ref <18)

## 2022-12-30 LAB — SARS CORONAVIRUS 2 BY RT PCR: SARS Coronavirus 2 by RT PCR: POSITIVE — AB

## 2022-12-30 LAB — OSMOLALITY, URINE: Osmolality, Ur: 208 mOsm/kg — ABNORMAL LOW (ref 300–900)

## 2022-12-30 LAB — CREATININE, URINE, RANDOM: Creatinine, Urine: 60 mg/dL

## 2022-12-30 LAB — PREPARE RBC (CROSSMATCH)

## 2022-12-30 LAB — LACTIC ACID, PLASMA
Lactic Acid, Venous: 2.1 mmol/L (ref 0.5–1.9)
Lactic Acid, Venous: 2.3 mmol/L (ref 0.5–1.9)

## 2022-12-30 LAB — SODIUM, URINE, RANDOM: Sodium, Ur: 56 mmol/L

## 2022-12-30 LAB — CK: Total CK: 45 U/L — ABNORMAL LOW (ref 49–397)

## 2022-12-30 LAB — PROTIME-INR
INR: 1.2 (ref 0.8–1.2)
Prothrombin Time: 15.1 seconds (ref 11.4–15.2)

## 2022-12-30 LAB — MAGNESIUM: Magnesium: 1.4 mg/dL — ABNORMAL LOW (ref 1.7–2.4)

## 2022-12-30 LAB — TSH: TSH: 2.741 u[IU]/mL (ref 0.350–4.500)

## 2022-12-30 MED ORDER — ALBUTEROL SULFATE (2.5 MG/3ML) 0.083% IN NEBU: 2.5000 mg | INHALATION_SOLUTION | RESPIRATORY_TRACT | Status: DC | PRN

## 2022-12-30 MED ORDER — LACTATED RINGERS IV BOLUS: 500.0000 mL | Freq: Once | INTRAVENOUS | Status: DC

## 2022-12-30 MED ORDER — ACETAMINOPHEN 650 MG RE SUPP: 650.0000 mg | Freq: Four times a day (QID) | RECTAL | Status: DC | PRN

## 2022-12-30 MED ORDER — SODIUM CHLORIDE 0.9 % IV SOLN
1.0000 g | Freq: Every day | INTRAVENOUS | Status: DC
  Administered 2022-12-31 (×2): 1 g via INTRAVENOUS
  Filled 2022-12-30 (×2): qty 10

## 2022-12-30 MED ORDER — ONDANSETRON HCL 4 MG PO TABS: 4.0000 mg | ORAL_TABLET | Freq: Four times a day (QID) | ORAL | Status: DC | PRN

## 2022-12-30 MED ORDER — ATROPINE SULFATE 1 MG/10ML IJ SOSY
0.5000 mg | PREFILLED_SYRINGE | Freq: Once | INTRAMUSCULAR | Status: DC
  Filled 2022-12-30: qty 10

## 2022-12-30 MED ORDER — POTASSIUM CHLORIDE 10 MEQ/100ML IV SOLN
10.0000 meq | INTRAVENOUS | Status: AC
  Administered 2022-12-30 (×3): 10 meq via INTRAVENOUS
  Filled 2022-12-30 (×4): qty 100

## 2022-12-30 MED ORDER — ATORVASTATIN CALCIUM 40 MG PO TABS
40.0000 mg | ORAL_TABLET | Freq: Every day | ORAL | Status: DC
  Administered 2023-01-01 – 2023-01-05 (×5): 40 mg via ORAL
  Filled 2022-12-30 (×5): qty 1

## 2022-12-30 MED ORDER — LEVETIRACETAM 500 MG PO TABS: 500.0000 mg | ORAL_TABLET | Freq: Two times a day (BID) | ORAL | Status: DC

## 2022-12-30 MED ORDER — LEVETIRACETAM 750 MG PO TABS
750.0000 mg | ORAL_TABLET | Freq: Two times a day (BID) | ORAL | Status: DC
  Filled 2022-12-30: qty 1

## 2022-12-30 MED ORDER — ONDANSETRON HCL 4 MG/2ML IJ SOLN: 4.0000 mg | Freq: Four times a day (QID) | INTRAMUSCULAR | Status: DC | PRN

## 2022-12-30 MED ORDER — SODIUM CHLORIDE 0.9 % IV SOLN: INTRAVENOUS | Status: AC

## 2022-12-30 MED ORDER — PANTOPRAZOLE SODIUM 40 MG PO TBEC
40.0000 mg | DELAYED_RELEASE_TABLET | Freq: Two times a day (BID) | ORAL | Status: DC
  Administered 2022-12-31 – 2023-01-05 (×10): 40 mg via ORAL
  Filled 2022-12-30 (×9): qty 1

## 2022-12-30 MED ORDER — ACETAMINOPHEN 325 MG PO TABS: 650.0000 mg | ORAL_TABLET | Freq: Four times a day (QID) | ORAL | Status: DC | PRN

## 2022-12-30 MED ORDER — CITALOPRAM HYDROBROMIDE 10 MG PO TABS
10.0000 mg | ORAL_TABLET | Freq: Every day | ORAL | Status: DC
  Administered 2023-01-01 – 2023-01-05 (×5): 10 mg via ORAL
  Filled 2022-12-30 (×5): qty 1

## 2022-12-30 MED ORDER — MAGNESIUM SULFATE 2 GM/50ML IV SOLN
2.0000 g | Freq: Once | INTRAVENOUS | Status: AC
  Administered 2022-12-30: 2 g via INTRAVENOUS
  Filled 2022-12-30: qty 50

## 2022-12-30 MED ORDER — ALLOPURINOL 100 MG PO TABS: 100.0000 mg | ORAL_TABLET | Freq: Every day | ORAL | Status: DC

## 2022-12-30 MED ORDER — QUETIAPINE FUMARATE 50 MG PO TABS
50.0000 mg | ORAL_TABLET | Freq: Every day | ORAL | Status: DC
  Administered 2022-12-31 – 2023-01-04 (×5): 50 mg via ORAL
  Filled 2022-12-30 (×5): qty 1

## 2022-12-30 MED ORDER — MAGNESIUM SULFATE IN D5W 1-5 GM/100ML-% IV SOLN
1.0000 g | Freq: Once | INTRAVENOUS | Status: AC
  Administered 2022-12-31: 1 g via INTRAVENOUS
  Filled 2022-12-30 (×2): qty 100

## 2022-12-30 MED ORDER — SODIUM CHLORIDE 0.9% IV SOLUTION: Freq: Once | INTRAVENOUS | Status: DC

## 2022-12-30 MED ORDER — TRAMADOL HCL 50 MG PO TABS
50.0000 mg | ORAL_TABLET | Freq: Two times a day (BID) | ORAL | Status: DC | PRN
  Administered 2023-01-02: 50 mg via ORAL
  Filled 2022-12-30: qty 1

## 2022-12-30 MED ORDER — TAMSULOSIN HCL 0.4 MG PO CAPS
0.4000 mg | ORAL_CAPSULE | Freq: Every day | ORAL | Status: DC
  Administered 2022-12-31 – 2023-01-04 (×5): 0.4 mg via ORAL
  Filled 2022-12-30 (×6): qty 1

## 2022-12-30 MED ORDER — LEVETIRACETAM IN NACL 1000 MG/100ML IV SOLN
1000.0000 mg | Freq: Once | INTRAVENOUS | Status: AC
  Administered 2022-12-31: 1000 mg via INTRAVENOUS
  Filled 2022-12-30: qty 100

## 2022-12-30 MED ORDER — HYDROCODONE-ACETAMINOPHEN 5-325 MG PO TABS: 1.0000 | ORAL_TABLET | ORAL | Status: DC | PRN

## 2022-12-30 MED ORDER — POTASSIUM CHLORIDE 20 MEQ PO PACK
80.0000 meq | PACK | ORAL | Status: AC
  Administered 2022-12-30: 80 meq via ORAL
  Filled 2022-12-30: qty 4

## 2022-12-30 NOTE — ED Provider Notes (Signed)
[place] EMERGENCY DEPARTMENT AT [facility] Provider Note   CSN: [id number] Arrival date & time: 12/30/22  1801     History  Chief Complaint  Patient presents with   Hypotension    Andres White is a 77 y.o. male.  77 year old male with a history of stroke on aspirin and plavix with residual right-sided deficit, seizures, hypertension, PAD, and CHF who presents to the emergency department with lethargy.  Over the past 4 days has been lethargic than usual.  Patient reports that he has been more confused than usual as well but does have a history of dementia and at baseline does not typically know what the date is.  Initially reported to EMS that there was concern for seizure but family did not express this to me.  Has a history of recently treated UTI with levofloxacin and they are concerned they may have a UTI at this time.  No fevers.  No cough.       Home Medications Prior to Admission medications   Medication Sig Start Date End Date Taking? Authorizing Provider  acetaminophen (TYLENOL) 325 MG tablet Take 1-2 tablets (325-650 mg total) by mouth every 4 (four) hours as needed for mild pain. 07/05/20  Yes Love, Evlyn Kanner, PA-C  allopurinol (ZYLOPRIM) 100 MG tablet Take 1 tablet (100 mg total) by mouth daily. TAKE 1 TABLET(100 MG) BY MOUTH DAILY 10/03/22  Yes Raulkar, Drema Pry, MD  amLODipine (NORVASC) 10 MG tablet Take 1 tablet (10 mg total) by mouth daily. 04/15/22  Yes Olive Bass, FNP  aspirin EC 81 MG tablet Take 1 tablet (81 mg total) by mouth daily. Patient taking differently: Take 81 mg by mouth in the morning. 07/18/20  Yes Love, Evlyn Kanner, PA-C  atorvastatin (LIPITOR) 40 MG tablet TAKE 1 TABLET BY MOUTH ONCE DAILY 04/10/22  Yes Raulkar, Drema Pry, MD  cilostazol (PLETAL) 50 MG tablet TAKE 1 TABLET BY MOUTH TWICE DAILY 06/18/22  Yes Raulkar, Drema Pry, MD  citalopram (CELEXA) 10 MG tablet Take 10 mg by mouth daily. 12/01/22  Yes [provider]   colchicine 0.6 MG tablet Take 0.6 mg by mouth daily. 12/01/22  Yes [provider]  furosemide (LASIX) 20 MG tablet Take 1 tablet (20 mg total) by mouth every morning. Patient taking differently: Take 20 mg by mouth every other day. 09/12/22  Yes O'Neal, Ronnald Ramp, MD  levETIRAcetam (KEPPRA) 500 MG tablet Take 1 tablet (500 mg total) by mouth 2 (two) times daily. 08/05/22  Yes McCue, Shanda Bumps, NP  levofloxacin (LEVAQUIN) 500 MG tablet Take 500 mg by mouth daily. Take for 10 days 12/26/22 01/05/23 Yes [provider]  lisinopril-hydrochlorothiazide (ZESTORETIC) 10-12.5 MG tablet Take 1 tablet by mouth daily. 12/01/22  Yes [provider]  Multiple Vitamin (MULTIVITAMIN WITH MINERALS) TABS tablet Take 1 tablet by mouth daily. Patient taking differently: Take 1 tablet by mouth daily at 12 noon. 07/19/20  Yes Love, Evlyn Kanner, PA-C  pantoprazole (PROTONIX) 40 MG tablet TAKE 1 TABLET BY MOUTH TWICE DAILY 10/31/22  Yes Worthy Rancher B, FNP  QUEtiapine (SEROQUEL) 50 MG tablet Take 50 mg by mouth at bedtime. 09/01/22  Yes [provider]  tamsulosin (FLOMAX) 0.4 MG CAPS capsule Take 1 capsule (0.4 mg total) by mouth daily after supper. 09/04/22  Yes Rhetta Mura, MD  traMADol (ULTRAM) 50 MG tablet Take 1 tablet (50 mg total) by mouth 2 (two) times daily as needed. 10/03/22  Yes Raulkar, Drema Pry, MD  vitamin B-12 (CYANOCOBALAMIN) 1000 MCG tablet Take 1,000 mcg by mouth daily.   Yes [provider]  hydrocerin (EUCERIN) CREA Apply 1 application. topically 2 (two) times daily. Patient taking differently: Apply 1 application  topically 2 (two) times daily. Apply to both legs 12/10/21   Olive Bass, FNP      Allergies    Patient has no known allergies.    Review of Systems   Review of Systems  Physical Exam Updated Vital Signs BP 129/86   Pulse 68   Temp (!) 97.5 F (36.4 C) (Axillary)   Resp 16   SpO2 99%  Physical Exam Vitals and nursing note  reviewed.  Constitutional:      General: He is not in acute distress.    Appearance: He is well-developed.     Comments: Lethargic appearing  HENT:     Head: Normocephalic and atraumatic.     Right Ear: External ear normal.     Left Ear: External ear normal.     Nose: Nose normal.  Eyes:     Extraocular Movements: Extraocular movements intact.     Conjunctiva/sclera: Conjunctivae normal.     Comments: Pupils 4 mm and reactive  Cardiovascular:     Rate and Rhythm: Regular rhythm. Bradycardia present.     Heart sounds: Normal heart sounds.  Pulmonary:     Effort: Pulmonary effort is normal. No respiratory distress.     Breath sounds: Normal breath sounds.  Abdominal:     General: There is no distension.     Palpations: Abdomen is soft. There is no mass.     Tenderness: There is no abdominal tenderness. There is no guarding.  Musculoskeletal:     Cervical back: Normal range of motion and neck supple.     Right lower leg: Edema present.     Left lower leg: Edema present.  Skin:    General: Skin is warm and dry.  Neurological:     Mental Status: Mental status is at baseline.     Cranial Nerves: No cranial nerve deficit.     Comments: Alert and oriented to self and place but not year at baseline per patient's family.  Chronic right-sided upper extremity weakness.  3/5 strength in left upper extremity left lower extremity and right lower extremity.  Psychiatric:        Mood and Affect: Mood normal.        Behavior: Behavior normal.     ED Results / Procedures / Treatments   Labs (all labs ordered are listed, but only abnormal results are displayed) Labs Reviewed  SARS CORONAVIRUS 2 BY RT PCR - Abnormal; Notable for the following components:      Result Value   SARS Coronavirus 2 by RT PCR POSITIVE (*)    All other components within normal limits  COMPREHENSIVE METABOLIC PANEL - Abnormal; Notable for the following components:   Potassium 2.2 (*)    Glucose, Bld 112 (*)     Creatinine, Ser 1.68 (*)    Calcium 7.8 (*)    Albumin 2.1 (*)    GFR, Estimated 41 (*)    All other components within normal limits  LACTIC ACID, PLASMA - Abnormal; Notable for the following components:   Lactic Acid, Venous 2.1 (*)    All other components within normal limits  LACTIC ACID, PLASMA - Abnormal; Notable for the following components:   Lactic Acid, Venous 2.3 (*)    All other components within normal limits  CBC WITH DIFFERENTIAL/PLATELET - Abnormal; Notable for the following components:   RBC 2.66 (*)    Hemoglobin 5.5 (*)    HCT 19.8 (*)    MCV 74.4 (*)    MCH 20.7 (*)    MCHC 27.8 (*)    RDW 19.9 (*)    All other components within normal limits  URINALYSIS, W/ REFLEX TO CULTURE (INFECTION SUSPECTED) - Abnormal; Notable for the following components:   APPearance HAZY (*)    Hgb urine dipstick SMALL (*)    Leukocytes,Ua LARGE (*)    Bacteria, UA MANY (*)    All other components within normal limits  MAGNESIUM - Abnormal; Notable for the following components:   Magnesium 1.4 (*)    All other components within normal limits  CK - Abnormal; Notable for the following components:   Total CK 45 (*)    All other components within normal limits  OSMOLALITY, URINE - Abnormal; Notable for the following components:   Osmolality, Ur 208 (*)    All other components within normal limits  TROPONIN I (HIGH SENSITIVITY) - Abnormal; Notable for the following components:   Troponin I (High Sensitivity) 18 (*)    All other components within normal limits  CULTURE, BLOOD (ROUTINE X 2)  CULTURE, BLOOD (ROUTINE X 2)  URINE CULTURE  PROTIME-INR  TSH  CREATININE, URINE, RANDOM  SODIUM, URINE, RANDOM  BLOOD GAS, VENOUS  AMMONIA  PREALBUMIN  VITAMIN B12  FOLATE  RETICULOCYTES  OSMOLALITY  IRON AND TIBC  FERRITIN  PROCALCITONIN  BASIC METABOLIC PANEL  CBC  POC OCCULT BLOOD, ED  TYPE AND SCREEN  PREPARE RBC (CROSSMATCH)  ABO/RH  TROPONIN I (HIGH SENSITIVITY)     EKG EKG Interpretation  Date/Time:  Tuesday Dec 30 2022 18:15:26 EDT Ventricular Rate:  55 PR Interval:  200 QRS Duration: 99 QT Interval:  579 QTC Calculation: 554 R Axis:   -39 Text Interpretation: Sinus rhythm Inferior infarct, old Anterior infarct, old Prolonged QT interval Confirmed by Vonita Moss [phone]) on 12/30/2022 6:59:18 PM  Radiology DG Chest Portable 1 View  Result Date: 12/30/2022 CLINICAL DATA:  Lethargic Hypotension, lethargy and weakness x3 days. Seizure today. Hx of stroke, HTN. EXAM: PORTABLE CHEST 1 VIEW.  Patient is rotated. COMPARISON:  Chest x-ray 08/29/2022 FINDINGS: The heart and mediastinal contours are within normal limits. Aortic calcification. Developed retrocardiac airspace opacity. No pulmonary edema. No pleural effusion. No pneumothorax. No acute osseous abnormality. IMPRESSION: 1. Developing retrocardiac airspace opacity with limited evaluation due to patient rotation. Recommend repeat PA and lateral view of the chest. 2.  Aortic Atherosclerosis (ICD10-I70.0). Electronically Signed   By: Tish Frederickson M.D.   On: 12/30/2022 19:50    Procedures Procedures    Medications Ordered in ED Medications  potassium chloride 10 mEq in 100 mL IVPB (10 mEq Intravenous New Bag/Given 12/30/22 2257)  0.9 %  sodium chloride infusion (Manually program via Guardrails IV Fluids) (has no administration in time range)  magnesium sulfate IVPB 1 g 100 mL (has no administration in time range)  potassium chloride (KLOR-CON) packet 80 mEq (80 mEq Oral Given 12/30/22 2050)  magnesium sulfate IVPB 2 g 50 mL (0 g Intravenous Stopped 12/30/22 2142)    ED Course/ Medical Decision Making/ A&P Clinical Course as of 12/30/22 2259  Tue Dec 30, 2022  2108 Dr Adela Glimpse from hospitalist to admit.  Requesting that cardiology be consulted and troponin be sent. [RP]  2246 Dr. Juel Burrow from cardiology consulted.  Recommends replenishing the patient's  electrolytes.  Does not feel that the  patient will likely require any other acute interventions.  Will see the patient shortly. [RP]    Clinical Course User Index [RP] Rondel Baton, MD                             Medical Decision Making Amount and/or Complexity of Data Reviewed Labs: ordered. Radiology: ordered.  Risk Prescription drug management. Decision regarding hospitalization.   ROLLINS PUSEY is a 77 y.o. male with comorbidities that complicate the patient evaluation including stroke on aspirin and plavix with residual right-sided deficit, seizures, hypertension, PAD, and CHF who presents to the emergency department with lethargy.    Initial Ddx:  Infection, anemia, UTI, pneumonia, anemia, hypothyroidism  MDM:  Patient presents to the emergency department was slightly hypotensive but blood pressure spontaneously improved without any intervention.  Unclear exactly what is causing his symptoms at this point in time.  Feel he may have a urinary tract infection.  Denies any other specific infection symptoms at this time but will obtain a chest x-ray to evaluate for pneumonia.  Was having frequent ectopies will obtain EKG and electrolytes.  Plan:  Labs Magnesium Blood cultures Lactate TSH  ED Summary/Re-evaluation:  Patient's labs returned and show that he has a potassium of 2.2 and magnesium also low.  Suspect that this is contributing to his ectopy.  Will replenish his electrolytes at this time.  Hemoglobin also found to be 5.5.  Rectal exam was performed which did not show any gross blood.  Hemoccult was sent and is pending at the time of admission.  Patient appears to be at his mental baseline at this time.  Were having some difficulties with catheterization but eventually was able to place a Foley catheter that does appear to show that he has a UTI.  Will start him on ceftriaxone at this point in time.   This patient presents to the ED for concern of complaints listed in HPI, this involves an extensive  number of treatment options, and is a complaint that carries with it a high risk of complications and morbidity. Disposition including potential need for admission considered.   Dispo: Admit to Floor  Additional history obtained from daughter Records reviewed Outpatient Clinic Notes The following labs were independently interpreted: Chemistry and show  AKI, hypokalemia, hypomagnesemia I independently reviewed the following imaging with scope of interpretation limited to determining acute life threatening conditions related to emergency care: Chest x-ray and agree with the radiologist interpretation with the following exceptions: none I personally reviewed and interpreted cardiac monitoring: sinus bradycardia and with frequent PVCs and nonsustained V. tach I personally reviewed and interpreted the pt's EKG: see above for interpretation  I have reviewed the patients home medications and made adjustments as needed Consults: Cardiology and Hospitalist Social Determinants of health:  Elderly        Final Clinical Impression(s) / ED Diagnoses Final diagnoses:  AKI (acute kidney injury) (HCC)  Anemia, unspecified type  Hypokalemia  Hypomagnesemia  Acute cystitis without hematuria  Lethargy    Rx / DC Orders ED Discharge Orders     None       CRITICAL CARE Performed by: Rondel Baton   Total critical care time: 60 minutes  Critical care time was exclusive of separately billable procedures and treating other patients.  Critical care was necessary to treat or prevent imminent or life-threatening deterioration.  Critical care was time  spent personally by me on the following activities: development of treatment plan with patient and/or surrogate as well as nursing, discussions with consultants, evaluation of patient's response to treatment, examination of patient, obtaining history from patient or surrogate, ordering and performing treatments and interventions, ordering and  review of laboratory studies, ordering and review of radiographic studies, pulse oximetry and re-evaluation of patient's condition.     Rondel Baton, MD 12/31/22 1440

## 2022-12-30 NOTE — Assessment & Plan Note (Signed)
Unclear etiology continue serial EKG. May have to hold aspirin for tonight, Given iron deficiency anemia appreciate GI consult Transfuse 2 units

## 2022-12-30 NOTE — ED Triage Notes (Addendum)
Pt arrives via EMS from home. A mobile PCP called ems due to concerns for possible UTI. Pt has been lethargic the past 4 days. GCS currently 13. Hx of dementia. Upon EMS arrival pt had a BP 78/40 and brady in the 40s. There is also reported concern of a seizure.

## 2022-12-30 NOTE — H&P (Signed)
Andres White ZOX:[id number] DOB: 01-24-1946 DOA: 12/30/2022     PCP: Hildred Priest, FNP   Outpatient Specialists:  CARDS:  Dr. Reatha Harps, MD   NEurology     Dr. Pearlean Brownie   Pulmonary *  Dr.  Oncology * Dr. Sandria Manly* Dr.  Deboraha Sprang, [facility]) No care team member to display Urology Dr. *  Patient arrived to ER on 12/30/22 at 1801 Referred by Attending Rondel Baton, MD   Patient coming from:    home Lives  With family    Chief Complaint:   Chief Complaint  Patient presents with   Hypotension    HPI: Andres White is a 77 y.o. male with medical history significant of dementia BPH, history of stroke with right side weakness on aspirin and Plavix with right residual deficit, history of seizure disorder, hypertension, PAD, and CHF chronic diastolic, NSVT, left carotid stenosis, history of bradycardia    Presented with increased confusion Coming from home because patient is concerning that he has UTI has been lethargic for the past 4 days history of dementia to start with on arrival blood pressure 78/40 bradycardia down to 40s With prior history of strokes currently on aspirin and Plavix has known residual right-sided weakness and history of seizures has known history of dementia and but usually at baseline able to communicate now is more confused has recently been treated for UTI with Levaquin no fevers or cough at home   Family reports left side has been weaker for the past 4 day Family states he had an episode of eyes rolling in the back of his head and he was drooling' have not had  a seizure for few years not since he had a stroke  Denies significant ETOH intake   Does not smoke   Lab Results  Component Value Date   SARSCOV2NAA NEGATIVE 08/29/2022   SARSCOV2NAA NEGATIVE 11/26/2020   SARSCOV2NAA NEGATIVE 09/16/2020   SARSCOV2NAA NEGATIVE 06/24/2020        Regarding pertinent Chronic problems:   Hyperlipidemia -  on statins Lipitor (atorvastatin)  Lipid Panel      Component Value Date/Time   CHOL 104 12/10/2021 1123   TRIG 93.0 12/10/2021 1123   HDL 50.50 12/10/2021 1123   CHOLHDL 2 12/10/2021 1123   VLDL 18.6 12/10/2021 1123   LDLCALC 35 12/10/2021 1123   PAD on Pletal   HTN on    chronic CHF diastolic/  - last echo 2021 And Lasix Recent Results (from the past 81191 hour(s))  ECHOCARDIOGRAM COMPLETE   Collection Time: 06/25/20 10:53 AM  Result Value   Weight 2,624   Height 67   BP 158/79   S' Lateral 2.34   Narrative      ECHOCARDIOGRAM REPORT       IMPRESSIONS    1. Left ventricular ejection fraction, by estimation, is 60 to 65%. The left ventricle has normal function. The left ventricle has no regional wall motion abnormalities. There is severe concentric left ventricular hypertrophy. Left ventricular diastolic  parameters are consistent with Grade I diastolic dysfunction (impaired relaxation).  2. Right ventricular systolic function is normal. The right ventricular size is normal.  3. The mitral valve is normal in structure. Mild mitral valve regurgitation. No evidence of mitral stenosis.  4. The aortic valve is normal in structure. Aortic valve regurgitation is not visualized. No aortic stenosis is present.  5. The inferior vena cava is normal in size with greater than 50% respiratory variability, suggesting right  atrial pressure of 3 mmHg.         Prior stress test in 2022 was negative for ischemia.   Gout on allopurinol       Hx of CVA - with /out residual right side weakness deficits on Aspirin 81 mg,  Plavix     CKD stage IIIa- baseline Cr 1.0 CrCl cannot be calculated (Unknown ideal weight.).  Lab Results  Component Value Date   CREATININE 1.68 (H) 12/30/2022   CREATININE 1.07 09/04/2022   CREATININE 1.03 09/03/2022      BPH - on Flomax,        Dementia - on Seroquel    Chronic anemia - baseline hg Hemoglobin & Hematocrit  Recent Labs    09/03/22 0741 09/04/22 0436 12/30/22 1846  HGB 7.6* 8.0* 5.5*    Iron/TIBC/Ferritin/ %Sat    Component Value Date/Time   IRON 52 09/03/2022 0741   TIBC 237 (L) 09/03/2022 0741   FERRITIN 79 09/03/2022 0741   IRONPCTSAT 22 09/03/2022 0741   IRONPCTSAT 21 12/10/2021 1123     Seizure DO -     currently on Keppra    While in ER: Clinical Course as of 12/30/22 2129  Tue Dec 30, 2022  2108 Dr Adela Glimpse [RP]    Clinical Course User Index [RP] Rondel Baton, MD  Found anemic Brown stool K 2.2 and Mag 1.2     Lab Orders         Culture, blood (Routine x 2)         Comprehensive metabolic panel         Lactic acid, plasma         CBC with Differential         Protime-INR         Urinalysis, w/ Reflex to Culture (Infection Suspected) -Urine, Clean Catch         Magnesium         TSH         POC occult blood, ED      CT HEAD *** NON acute    CXR -  Developing retrocardiac airspace opacity with limited evaluation due to patient rotation. Recommend repeat PA and lateral view of the chest.    CTA chest - ***nonacute, no PE, * no evidence of infiltrate  Following Medications were ordered in ER: Medications  potassium chloride 10 mEq in 100 mL IVPB (10 mEq Intravenous New Bag/Given 12/30/22 2043)  0.9 %  sodium chloride infusion (Manually program via Guardrails IV Fluids) (has no administration in time range)  magnesium sulfate IVPB 2 g 50 mL (2 g Intravenous New Bag/Given 12/30/22 2049)  potassium chloride (KLOR-CON) packet 80 mEq (80 mEq Oral Given 12/30/22 2050)    _______________________________________________________ ER Provider Called:   Cardiology   DrMarland Kitchen  They Recommend admit to medicine *** Will see in AM      ED Triage Vitals  Enc Vitals Group     BP 12/30/22 1810 (!) 96/58     Pulse Rate 12/30/22 1810 (!) 53     Resp 12/30/22 1810 17     Temp 12/30/22 1900 (!) 96.5 F (35.8 C)     Temp Source 12/30/22 1900 Rectal     SpO2 12/30/22 1809 (!) 88 %     Weight --      Height --      Head Circumference --      Peak  Flow --      Pain Score 12/30/22  1816 0     Pain Loc --      Pain Edu? --      Excl. in GC? --   TMAX(24)@     _________________________________________ Significant initial  Findings: Abnormal Labs Reviewed  COMPREHENSIVE METABOLIC PANEL - Abnormal; Notable for the following components:      Result Value   Potassium 2.2 (*)    Glucose, Bld 112 (*)    Creatinine, Ser 1.68 (*)    Calcium 7.8 (*)    Albumin 2.1 (*)    GFR, Estimated 41 (*)    All other components within normal limits  LACTIC ACID, PLASMA - Abnormal; Notable for the following components:   Lactic Acid, Venous 2.1 (*)    All other components within normal limits  CBC WITH DIFFERENTIAL/PLATELET - Abnormal; Notable for the following components:   RBC 2.66 (*)    Hemoglobin 5.5 (*)    HCT 19.8 (*)    MCV 74.4 (*)    MCH 20.7 (*)    MCHC 27.8 (*)    RDW 19.9 (*)    All other components within normal limits  MAGNESIUM - Abnormal; Notable for the following components:   Magnesium 1.4 (*)    All other components within normal limits      _________________________ Troponin ***ordered Cardiac Panel (last 3 results) No results for input(s): "CKTOTAL", "CKMB", "TROPONINIHS", "RELINDX" in the last 72 hours.   ECG: Ordered Personally reviewed and interpreted by me showing: HR : 55 Rhythm:Sinus rhythm Inferior infarct, old Anterior infarct, old Prolonged QT interval QTC 554   COVID-19 Labs  No results for input(s): "DDIMER", "FERRITIN", "LDH", "CRP" in the last 72 hours.  Lab Results  Component Value Date   SARSCOV2NAA NEGATIVE 08/29/2022   SARSCOV2NAA NEGATIVE 11/26/2020   SARSCOV2NAA NEGATIVE 09/16/2020   SARSCOV2NAA NEGATIVE 06/24/2020     The recent clinical data is shown below. Vitals:   12/30/22 1845 12/30/22 1900 12/30/22 1900 12/30/22 1914  BP:    115/70  Pulse:      Resp: 17 (!) 22    Temp:   (!) 96.5 F (35.8 C)   TempSrc:   Rectal   SpO2:         WBC     Component Value Date/Time    WBC 5.3 12/30/2022 1846   LYMPHSABS 1.9 12/30/2022 1846   LYMPHSABS 2.9 03/23/2014 2009   MONOABS 0.3 12/30/2022 1846   MONOABS 0.5 03/23/2014 2009   EOSABS 0.0 12/30/2022 1846   EOSABS 0.1 03/23/2014 2009   BASOSABS 0.0 12/30/2022 1846   BASOSABS 0.0 03/23/2014 2009    Lactic Acid, Venous    Component Value Date/Time   LATICACIDVEN 2.1 (HH) 12/30/2022 1846      Lactic Acid, Venous    Component Value Date/Time   LATICACIDVEN 2.1 (HH) 12/30/2022 1846    Procalcitonin *** Ordered      UA *** no evidence of UTI  ***Pending ***not ordered   Urine analysis:    Component Value Date/Time   COLORURINE YELLOW 08/29/2022 1728   APPEARANCEUR CLOUDY (A) 08/29/2022 1728   APPEARANCEUR Cloudy (A) 08/26/2022 1407   LABSPEC 1.010 08/29/2022 1728   LABSPEC 1.010 03/23/2014 2009   PHURINE 5.5 08/29/2022 1728   GLUCOSEU NEGATIVE 08/29/2022 1728   GLUCOSEU Negative 03/23/2014 2009   HGBUR TRACE (A) 08/29/2022 1728   BILIRUBINUR NEGATIVE 08/29/2022 1728   BILIRUBINUR Negative 08/26/2022 1407   BILIRUBINUR Negative 03/23/2014 2009   KETONESUR NEGATIVE 08/29/2022 1728   PROTEINUR  NEGATIVE 08/29/2022 1728   NITRITE NEGATIVE 08/29/2022 1728   LEUKOCYTESUR LARGE (A) 08/29/2022 1728   LEUKOCYTESUR Negative 03/23/2014 2009    Results for orders placed or performed during the hospital encounter of 08/29/22  Blood culture (routine x 2)     Status: None   Collection Time: 08/29/22  5:14 PM   Specimen: BLOOD RIGHT FOREARM  Result Value Ref Range Status   Specimen Description   Final    BLOOD RIGHT FOREARM Performed at [facility], [address] [place], [place] [address]    Special Requests   Final    BOTTLES DRAWN AEROBIC AND ANAEROBIC Blood Culture results may not be optimal due to an inadequate volume of blood received in culture bottles Performed at [facility], [address] [place], [place] [address]    Culture   Final    NO GROWTH 5 DAYS Performed  at [facility], [address] [place], [place] [address]    Report Status 09/03/2022 FINAL  Final  Blood culture (routine x 2)     Status: None   Collection Time: 08/29/22  5:23 PM   Specimen: Left Antecubital; Blood  Result Value Ref Range Status   Specimen Description   Final    LEFT ANTECUBITAL BLOOD Performed at [facility], [address] [place], [place] [address]    Special Requests   Final    BOTTLES DRAWN AEROBIC AND ANAEROBIC Blood Culture adequate volume Performed at [facility], [address] [place], [place] [address]    Culture   Final    NO GROWTH 5 DAYS Performed at [facility], [address] [place], [place] [address]    Report Status 09/03/2022 FINAL  Final  Resp panel by RT-PCR (RSV, Flu A&B, Covid) Anterior Nasal Swab     Status: None   Collection Time: 08/29/22  5:28 PM   Specimen: Anterior Nasal Swab    ________________________________________________________________  Arterial ***Venous  Blood Gas result:  pH *** pCO2 ***; pO2 ***;     %O2 Sat ***.  ABG    Component Value Date/Time   PHART 7.43 08/30/2022 0835   PCO2ART 32 08/30/2022 0835   PO2ART 126 (H) 08/30/2022 0835   HCO3 21.4 08/30/2022 0835   TCO2 29 08/29/2022 1734   ACIDBASEDEF 2.3 (H) 08/30/2022 0835   O2SAT 100 08/30/2022 0835     __________________________________________________________ Recent Labs  Lab 12/30/22 1846  NA 135  K 2.2*  CO2 22  GLUCOSE 112*  BUN 11  CREATININE 1.68*  CALCIUM 7.8*  MG 1.4*    Cr    Up from baseline see below Lab Results  Component Value Date   CREATININE 1.68 (H) 12/30/2022   CREATININE 1.07 09/04/2022   CREATININE 1.03 09/03/2022    Recent Labs  Lab 12/30/22 1846  AST 35  ALT 17  ALKPHOS 68  BILITOT 0.5  PROT 6.6  ALBUMIN 2.1*   Lab Results  Component Value Date   CALCIUM 7.8 (L) 12/30/2022   PHOS 2.1 (L) 09/04/2022          Plt: Lab Results  Component Value Date   PLT 311  12/30/2022         Recent Labs  Lab 12/30/22 1846  WBC 5.3  NEUTROABS 3.0  HGB 5.5*  HCT 19.8*  MCV 74.4*  PLT 311    HG/HCT * stable,  Down *Up from baseline see  below    Component Value Date/Time   HGB 5.5 (LL) 12/30/2022 1846   HGB 9.7 (L) 08/26/2022 1407   HCT 19.8 (L) 12/30/2022 1846   HCT 31.4 (L) 08/26/2022 1407   MCV 74.4 (L) 12/30/2022 1846   MCV 82 08/26/2022 1407   MCV 95 03/23/2014 2009      No results for input(s): "LIPASE", "AMYLASE" in the last 168 hours. No results for input(s): "AMMONIA" in the last 168 hours.    .lab  _______________________________________________ Hospitalist was called for admission for *** There are no diagnoses linked to this encounter.   The following Work up has been ordered so far:  Orders Placed This Encounter  Procedures   Culture, blood (Routine x 2)   DG Chest Portable 1 View   Comprehensive metabolic panel   Lactic acid, plasma   CBC with Differential   Protime-INR   Urinalysis, w/ Reflex to Culture (Infection Suspected) -Urine, Clean Catch   Magnesium   TSH   Notify physician (specify)  Specify: Notify provider for possible Code Sepsis   Document height and weight   Check Rectal Temperature   Informed Consent Details: Physician/Practitioner Attestation; Transcribe to consent form and obtain patient signature   Insert foley catheter   Limited resuscitation (code)   Consult to hospitalist   POC occult blood, ED   EKG 12-Lead   Type and screen [facility]   Prepare RBC (crossmatch)   ABO/Rh     OTHER Significant initial  Findings:  labs showing:     DM  labs:  HbA1C: No results for input(s): "HGBA1C" in the last 8760 hours.     CBG (last 3)  No results for input(s): "GLUCAP" in the last 72 hours.        Cultures:    Component Value Date/Time   SDES  08/29/2022 1723    LEFT ANTECUBITAL BLOOD Performed at [facility], [address] [place], [place] [address]     SPECREQUEST  08/29/2022 1723    BOTTLES DRAWN AEROBIC AND ANAEROBIC Blood Culture adequate volume Performed at [facility], [address], [place], [place] [address]    CULT  08/29/2022 1723    NO GROWTH 5 DAYS Performed at [facility], [address], [place], [place] [address]    REPTSTATUS 09/03/2022 FINAL 08/29/2022 1723     Radiological Exams on Admission: DG Chest Portable 1 View  Result Date: 12/30/2022 CLINICAL DATA:  Lethargic Hypotension, lethargy and weakness x3 days. Seizure today. Hx of stroke, HTN. EXAM: PORTABLE CHEST 1 VIEW.  Patient is rotated. COMPARISON:  Chest x-ray 08/29/2022 FINDINGS: The heart and mediastinal contours are within normal limits. Aortic calcification. Developed retrocardiac airspace opacity. No pulmonary edema. No pleural effusion. No pneumothorax. No acute osseous abnormality. IMPRESSION: 1. Developing retrocardiac airspace opacity with limited evaluation due to patient rotation. Recommend repeat PA and lateral view of the chest. 2.  Aortic Atherosclerosis (ICD10-I70.0). Electronically Signed   By: Tish Frederickson M.D.   On: 12/30/2022 19:50   _______________________________________________________________________________________________________ Latest  Blood pressure 115/70, pulse (!) 53, temperature (!) 96.5 F (35.8 C), temperature source Rectal, resp. rate (!) 22, SpO2 100 %.   Vitals  labs and radiology finding personally reviewed  Review of Systems:    Pertinent positives include: ***  Constitutional:  No weight loss, night sweats, Fevers, chills, fatigue, weight loss  HEENT:  No headaches, Difficulty swallowing,Tooth/dental problems,Sore throat,  No sneezing, itching, ear ache, nasal  congestion, post nasal drip,  Cardio-vascular:  No chest pain, Orthopnea, PND, anasarca, dizziness, palpitations.no Bilateral lower extremity swelling  GI:  No heartburn, indigestion, abdominal pain, nausea, vomiting, diarrhea, change in  bowel habits, loss of appetite, melena, blood in stool, hematemesis Resp:  no shortness of breath at rest. No dyspnea on exertion, No excess mucus, no productive cough, No non-productive cough, No coughing up of blood.No change in color of mucus.No wheezing. Skin:  no rash or lesions. No jaundice GU:  no dysuria, change in color of urine, no urgency or frequency. No straining to urinate.  No flank pain.  Musculoskeletal:  No joint pain or no joint swelling. No decreased range of motion. No back pain.  Psych:  No change in mood or affect. No depression or anxiety. No memory loss.  Neuro: no localizing neurological complaints, no tingling, no weakness, no double vision, no gait abnormality, no slurred speech, no confusion  All systems reviewed and apart from HOPI all are negative _______________________________________________________________________________________________ Past Medical History:   Past Medical History:  Diagnosis Date   Arthritis    Chronic back pain    Duodenal adenoma    Gout    Hypertension    Stroke (HCC) 06/2020   Syncope and collapse       Past Surgical History:  Procedure Laterality Date   BIOPSY  07/13/2020   Procedure: BIOPSY;  Surgeon: Meryl Dare, MD;  Location: [facility] ENDOSCOPY;  Service: Endoscopy;;   BIOPSY  11/28/2020   Procedure: BIOPSY;  Surgeon: Lemar Lofty., MD;  Location: [facility] ENDOSCOPY;  Service: Gastroenterology;;   CARDIAC CATHETERIZATION  2015   [facility]    COLONOSCOPY WITH PROPOFOL N/A 07/13/2020   Procedure: COLONOSCOPY WITH PROPOFOL;  Surgeon: Meryl Dare, MD;  Location: [facility] ENDOSCOPY;  Service: Endoscopy;  Laterality: N/A;   COLONOSCOPY WITH PROPOFOL N/A 11/28/2020   Procedure: COLONOSCOPY WITH PROPOFOL;  Surgeon: Meridee Score Netty Starring., MD;  Location: [facility] ENDOSCOPY;  Service: Gastroenterology;  Laterality: N/A;   ENDOSCOPIC MUCOSAL RESECTION N/A 11/28/2020   Procedure: ENDOSCOPIC MUCOSAL RESECTION;  Surgeon: Meridee Score  Netty Starring., MD;  Location: [facility] ENDOSCOPY;  Service: Gastroenterology;  Laterality: N/A;   ESOPHAGOGASTRODUODENOSCOPY N/A 07/13/2020   Procedure: ESOPHAGOGASTRODUODENOSCOPY (EGD);  Surgeon: Meryl Dare, MD;  Location: [facility] ENDOSCOPY;  Service: Endoscopy;  Laterality: N/A;   ESOPHAGOGASTRODUODENOSCOPY (EGD) WITH PROPOFOL N/A 11/28/2020   Procedure: ESOPHAGOGASTRODUODENOSCOPY (EGD) WITH PROPOFOL;  Surgeon: Meridee Score Netty Starring., MD;  Location: [facility] ENDOSCOPY;  Service: Gastroenterology;  Laterality: N/A;   HEMOSTASIS CLIP PLACEMENT  11/28/2020   Procedure: HEMOSTASIS CLIP PLACEMENT;  Surgeon: Lemar Lofty., MD;  Location: [facility] ENDOSCOPY;  Service: Gastroenterology;;   KNEE SURGERY Right    POLYPECTOMY  07/13/2020   Procedure: POLYPECTOMY;  Surgeon: Meryl Dare, MD;  Location: [facility] ENDOSCOPY;  Service: Endoscopy;;   POLYPECTOMY  11/28/2020   Procedure: POLYPECTOMY;  Surgeon: Lemar Lofty., MD;  Location: [facility] ENDOSCOPY;  Service: Gastroenterology;;   SUBMUCOSAL LIFTING INJECTION  11/28/2020   Procedure: SUBMUCOSAL LIFTING INJECTION;  Surgeon: Lemar Lofty., MD;  Location: [facility] ENDOSCOPY;  Service: Gastroenterology;;   SUBMUCOSAL TATTOO INJECTION  11/28/2020   Procedure: SUBMUCOSAL TATTOO INJECTION;  Surgeon: Lemar Lofty., MD;  Location: [facility] ENDOSCOPY;  Service: Gastroenterology;;    Social History:  Ambulatory   wheelchair bound,      reports that he has been smoking cigarettes. He has a 10.00 pack-year smoking history. He has been exposed to tobacco smoke. He has never used smokeless tobacco. He reports  that he does not currently use alcohol. He reports that he does not use drugs.   Family History:  Family History  Problem Relation Age of Onset   Hypertension Mother    Colon cancer Neg Hx    Pancreatic cancer Neg Hx    Stomach cancer Neg Hx    Esophageal cancer Neg Hx    Inflammatory bowel disease Neg Hx    Liver disease Neg Hx    Rectal cancer Neg Hx     ______________________________________________________________________________________________ Allergies: No Known Allergies   Prior to Admission medications   Medication Sig Start Date End Date Taking? Authorizing Provider  acetaminophen (TYLENOL) 325 MG tablet Take 1-2 tablets (325-650 mg total) by mouth every 4 (four) hours as needed for mild pain. 07/05/20   Love, Evlyn Kanner, PA-C  allopurinol (ZYLOPRIM) 100 MG tablet Take 1 tablet (100 mg total) by mouth daily. TAKE 1 TABLET(100 MG) BY MOUTH DAILY 10/03/22   Raulkar, Drema Pry, MD  amLODipine (NORVASC) 10 MG tablet Take 1 tablet (10 mg total) by mouth daily. 04/15/22   Olive Bass, FNP  aspirin EC 81 MG tablet Take 1 tablet (81 mg total) by mouth daily. Patient taking differently: Take 81 mg by mouth in the morning. 07/18/20   Love, Evlyn Kanner, PA-C  atorvastatin (LIPITOR) 40 MG tablet TAKE 1 TABLET BY MOUTH ONCE DAILY 04/10/22   Raulkar, Drema Pry, MD  cilostazol (PLETAL) 50 MG tablet TAKE 1 TABLET BY MOUTH TWICE DAILY 06/18/22   Raulkar, Drema Pry, MD  citalopram (CELEXA) 10 MG tablet Take 10 mg by mouth daily. 12/01/22   [provider]  colchicine 0.6 MG tablet Take 0.6 mg by mouth daily. 12/01/22   [provider]  furosemide (LASIX) 20 MG tablet Take 1 tablet (20 mg total) by mouth every morning. Patient taking differently: Take 20 mg by mouth every other day. 09/12/22   O'NealRonnald Ramp, MD  gabapentin (NEURONTIN) 300 MG capsule Take 300 mg by mouth 3 (three) times daily. 12/01/22   [provider]  hydrocerin (EUCERIN) CREA Apply 1 application. topically 2 (two) times daily. Patient taking differently: Apply 1 application  topically 2 (two) times daily. Apply to both legs 12/10/21   Olive Bass, FNP  levETIRAcetam (KEPPRA) 500 MG tablet Take 1 tablet (500 mg total) by mouth 2 (two) times daily. 08/05/22   Ihor Austin, NP  lisinopril-hydrochlorothiazide (ZESTORETIC) 10-12.5 MG tablet Take 1  tablet by mouth daily. 12/01/22   [provider]  Multiple Vitamin (MULTIVITAMIN WITH MINERALS) TABS tablet Take 1 tablet by mouth daily. Patient taking differently: Take 1 tablet by mouth daily at 12 noon. 07/19/20   Love, Evlyn Kanner, PA-C  pantoprazole (PROTONIX) 40 MG tablet TAKE 1 TABLET BY MOUTH TWICE DAILY 10/31/22   Worthy Rancher B, FNP  QUEtiapine (SEROQUEL) 50 MG tablet Take 50 mg by mouth at bedtime. Patient not taking: Reported on 10/23/2022 09/01/22   [provider]  tamsulosin (FLOMAX) 0.4 MG CAPS capsule Take 1 capsule (0.4 mg total) by mouth daily after supper. 09/04/22   Rhetta Mura, MD  traMADol (ULTRAM) 50 MG tablet Take 1 tablet (50 mg total) by mouth 2 (two) times daily as needed. 10/03/22   Raulkar, Drema Pry, MD  vitamin B-12 (CYANOCOBALAMIN) 1000 MCG tablet Take 1,000 mcg by mouth daily.    [provider]    ___________________________________________________________________________________________________ Physical Exam:    12/30/2022    7:14 PM 12/30/2022    6:30 PM 12/30/2022  6:10 PM  Vitals with BMI  Systolic 115 110 96  Diastolic 70 53 58  Pulse   53     1. General:  in No ***Acute distress***increased work of breathing ***complaining of severe pain****agitated * Chronically ill *well *cachectic *toxic acutely ill -appearing 2. Psychological: Alert and *** Oriented 3. Head/ENT:   Moist *** Dry Mucous Membranes                          Head Non traumatic, neck supple                          Normal *** Poor Dentition 4. SKIN: normal *** decreased Skin turgor,  Skin clean Dry and intact no rash    5. Heart: Regular rate and rhythm no*** Murmur, no Rub or gallop 6. Lungs: ***Clear to auscultation bilaterally, no wheezes or crackles   7. Abdomen: Soft, ***non-tender, Non distended *** obese ***bowel sounds present 8. Lower extremities: no clubbing, cyanosis, no ***edema 9. Neurologically Grossly intact, moving all 4 extremities  equally *** strength 5 out of 5 in all 4 extremities cranial nerves II through XII intact 10. MSK: Normal range of motion    Chart has been reviewed  ______________________________________________________________________________________________  Assessment/Plan 77 y.o. male with medical history significant of dementia BPH, history of stroke on aspirin and Plavix with right residual deficit, history of seizure disorder, hypertension, PAD, and CHF chronic diastolic, NSVT, left carotid stenosis, history of bradycardia  Admitted for symptomatic anemia, hypokalemia hypomagnesemia, acute encephalopathy, bradycardia   Present on Admission: **None**     No problem-specific Assessment & Plan notes found for this encounter.    Other plan as per orders.  DVT prophylaxis:  SCD     Code Status:    Code Status: Partial Code DNR/DNI  as per patient   I had personally discussed CODE STATUS with patient and family  ACP has been reviewed ***   Family Communication:   Family not at  Bedside  plan of care was discussed on the phone with *** Son, Daughter, Wife, Husband, Sister, Brother , father, mother  Diet    Disposition Plan:   *** likely will need placement for rehabilitation                          Back to current facility when stable                            To home once workup is complete and patient is stable  ***Following barriers for discharge:                            Electrolytes corrected                               Anemia corrected                             Pain controlled with PO medications                               Afebrile, white count improving able to transition to PO antibiotics  Will need to be able to tolerate PO                            Will likely need home health, home O2, set up                           Will need consultants to evaluate patient prior to discharge  ****EXPECT DC tomorrow       Consult Orders   (From admission, onward)           Start     Ordered   12/30/22 2016  Consult to hospitalist  Pg sent by deloris  Once       Provider:  (Not yet assigned)  Question Answer Comment  Place call to: [facility]   Reason for Consult Admit      12/30/22 2015                               Would benefit from PT/OT eval prior to DC  Ordered                   Swallow eval - SLP ordered                   Diabetes care coordinator                   Transition of care consulted                   Nutrition    consulted                                   Consults called:  Er provider to [facility]  Left msg to [facility]   Admission status:  ED Disposition     ED Disposition  Admit   Condition  --   Comment  The patient appears reasonably stabilized for admission considering the current resources, flow, and capabilities available in the ED at this time, and I doubt any other [facility] requiring further screening and/or treatment in the ED prior to admission is  present.           Obs***  ***  inpatient     I Expect 2 midnight stay secondary to severity of patient's current illness need for inpatient interventions justified by the following: ***hemodynamic instability despite optimal treatment (tachycardia *hypotension * tachypnea *hypoxia, hypercapnia) * Severe lab/radiological/exam abnormalities including:     and extensive comorbidities including: *substance abuse  *Chronic pain *DM2  * CHF * CAD  * COPD/asthma *Morbid Obesity * CKD *dementia *liver disease *history of stroke with residual deficits *  malignancy, * sickle cell disease  History of amputation Chronic anticoagulation  That are currently affecting medical management.   I expect  patient to be hospitalized for 2 midnights requiring inpatient medical care.  Patient is at high risk for adverse outcome (such as loss of life or disability) if not treated.  Indication for inpatient stay as  follows:  Severe change from baseline regarding mental status Hemodynamic instability despite maximal medical therapy,  ongoing suicidal ideations,  severe pain requiring acute inpatient management,  inability to maintain oral hydration   persistent chest pain despite medical management Need for operative/procedural  intervention  New or worsening hypoxia   Need for IV antibiotics, IV fluids, IV rate controling medications, IV antihypertensives, IV pain medications, IV anticoagulation, need for biPAP    Level of care        progressive tele indefinitely please discontinue once patient no longer qualifies COVID-19 Labs    Lab Results  Component Value Date   SARSCOV2NAA NEGATIVE 08/29/2022     Precautions: admitted as *** Covid Negative  ***asymptomatic screening protocol****PUI *** covid positive No active isolations ***If Covid PCR is negative  - please DC precautions - would need additional investigation given very high risk for false native test result    Critical***  Patient is critically ill due to  hemodynamic instability * respiratory failure *severe sepsis* ongoing chest pain*  They are at high risk for life/limb threatening clinical deterioration requiring frequent reassessment and modifications of care.  Services provided include examination of the patient, review of relevant ancillary tests, prescription of lifesaving therapies, review of medications and prophylactic therapy.  Total critical care time excluding separately billable procedures: 60*  Minutes.    Orlean Holtrop 12/30/2022, 9:05 PM ***  [facility]     after 2 AM please page floor coverage PA If 7AM-7PM, please contact the day team taking care of the patient using [email or web address]

## 2022-12-30 NOTE — Assessment & Plan Note (Signed)
Obtain urine electrolytes gently rehydrate and follow Try to obtain urine Difficult to pass Foley catheter Foley now May benefit from urology consult

## 2022-12-30 NOTE — Assessment & Plan Note (Signed)
-  chronic avoid nephrotoxic medications such as NSAIDs, Vanco Zosyn combo,  avoid hypotension, continue to follow renal function  

## 2022-12-30 NOTE — Plan of Care (Signed)
On-call neurology note  Patient with a history of prior stroke and seizures with concern for seizure-like activity.  Hypotensive, FOBT positive with possible blood loss anemia with an unclear history of an actual seizure. Could have had lowered seizure threshold in the setting of acute illness. Recommend an additional dose of Keppra-1 g IV now followed by increasing the dose to Keppra 750 twice daily instead of Keppra 500 twice daily. Routine EEG and MRI brain without contrast. If any of these are abnormal, please call for formal consultation  Plan discussed with Dr. Adela Glimpse  -- Milon Dikes, MD Neurologist [facility] Pager: [phone]

## 2022-12-30 NOTE — ED Notes (Signed)
This Clinical research associate attempted cath for UA. Unsuccessful attempt, this Clinical research associate felt meeting resistance at prostate, recommended coude for further attempts.

## 2022-12-30 NOTE — ED Notes (Signed)
ED TO INPATIENT HANDOFF REPORT  ED Nurse Name and Phone #: Alphonzo Lemmings, RN   S Name/Age/Gender Andres White 77 y.o. male Room/Bed: TRACC/TRACC  Code Status   Code Status: Partial Code  Home/SNF/Other Home Patient oriented to: self, place, and time Is this baseline? Yes   Triage Complete: Triage complete  Chief Complaint Acute encephalopathy [G93.40]  Triage Note Pt arrives via EMS from home. A mobile PCP called ems due to concerns for possible UTI. Pt has been lethargic the past 4 days. GCS currently 13. Hx of dementia. Upon EMS arrival pt had a BP 78/40 and brady in the 40s. There is also reported concern of a seizure.    Allergies No Known Allergies  Level of Care/Admitting Diagnosis ED Disposition     ED Disposition  Admit   Condition  --   Comment  Hospital Area: [facility] [[id number]]  Level of Care: Progressive [102]  Admit to Progressive based on following criteria: CARDIOVASCULAR & THORACIC of moderate stability with acute coronary syndrome symptoms/low risk myocardial infarction/hypertensive urgency/arrhythmias/heart failure potentially compromising stability and stable post cardiovascular intervention patients.  May place patient in observation at [facility] if equivalent level of care is available:: No  Covid Evaluation: Asymptomatic - no recent exposure (last 10 days) testing not required  Diagnosis: Acute encephalopathy [562130]  Admitting Physician: Therisa Doyne [[id number]]  Attending Physician: Therisa Doyne [[id number]]          B Medical/Surgery History Past Medical History:  Diagnosis Date   Arthritis    Chronic back pain    Duodenal adenoma    Gout    Hypertension    Stroke (HCC) 06/2020   Syncope and collapse    Past Surgical History:  Procedure Laterality Date   BIOPSY  07/13/2020   Procedure: BIOPSY;  Surgeon: Meryl Dare, MD;  Location[facility] ENDOSCOPY;  Service: Endoscopy;;   BIOPSY  11/28/2020    Procedure: BIOPSY;  Surgeon: Lemar Lofty., MD;  Location: [facility] ENDOSCOPY;  Service: Gastroenterology;;   CARDIAC CATHETERIZATION  2015   [facility]    COLONOSCOPY WITH PROPOFOL N/A 07/13/2020   Procedure: COLONOSCOPY WITH PROPOFOL;  Surgeon: Meryl Dare, MD;  Location: [facility] ENDOSCOPY;  Service: Endoscopy;  Laterality: N/A;   COLONOSCOPY WITH PROPOFOL N/A 11/28/2020   Procedure: COLONOSCOPY WITH PROPOFOL;  Surgeon: Meridee Score Netty Starring., MD;  Location: [facility] ENDOSCOPY;  Service: Gastroenterology;  Laterality: N/A;   ENDOSCOPIC MUCOSAL RESECTION N/A 11/28/2020   Procedure: ENDOSCOPIC MUCOSAL RESECTION;  Surgeon: Meridee Score Netty Starring., MD;  Location: [facility] ENDOSCOPY;  Service: Gastroenterology;  Laterality: N/A;   ESOPHAGOGASTRODUODENOSCOPY N/A 07/13/2020   Procedure: ESOPHAGOGASTRODUODENOSCOPY (EGD);  Surgeon: Meryl Dare, MD;  Location: [facility] ENDOSCOPY;  Service: Endoscopy;  Laterality: N/A;   ESOPHAGOGASTRODUODENOSCOPY (EGD) WITH PROPOFOL N/A 11/28/2020   Procedure: ESOPHAGOGASTRODUODENOSCOPY (EGD) WITH PROPOFOL;  Surgeon: Meridee Score Netty Starring., MD;  Location: [facility] ENDOSCOPY;  Service: Gastroenterology;  Laterality: N/A;   HEMOSTASIS CLIP PLACEMENT  11/28/2020   Procedure: HEMOSTASIS CLIP PLACEMENT;  Surgeon: Lemar Lofty., MD;  Location: [facility] ENDOSCOPY;  Service: Gastroenterology;;   KNEE SURGERY Right    POLYPECTOMY  07/13/2020   Procedure: POLYPECTOMY;  Surgeon: Meryl Dare, MD;  Location: [facility] ENDOSCOPY;  Service: Endoscopy;;   POLYPECTOMY  11/28/2020   Procedure: POLYPECTOMY;  Surgeon: Lemar Lofty., MD;  Location: [facility] ENDOSCOPY;  Service: Gastroenterology;;   SUBMUCOSAL LIFTING INJECTION  11/28/2020   Procedure: SUBMUCOSAL LIFTING INJECTION;  Surgeon: Lemar Lofty., MD;  Location: [facility] ENDOSCOPY;  Service: Gastroenterology;;   SUBMUCOSAL TATTOO INJECTION  11/28/2020   Procedure: SUBMUCOSAL TATTOO INJECTION;  Surgeon: Lemar Lofty., MD;  Location: [facility]  ENDOSCOPY;  Service: Gastroenterology;;     A IV Location/Drains/Wounds Patient Lines/Drains/Airways Status     Active Line/Drains/Airways     Name Placement date Placement time Site Days   Peripheral IV 12/30/22 18 G Anterior;Upper;Right Arm 12/30/22  1902  Arm  less than 1   Peripheral IV 12/30/22 22 G 1.75" Anterior;Left Forearm 12/30/22  2000  Forearm  less than 1   Urethral Catheter Carlton Adam, RN Coude 16 Fr. 12/30/22  2108  Coude  less than 1   External Urinary Catheter 09/19/20  0529  --  832   External Urinary Catheter 08/30/22  0109  --  122            Intake/Output Last 24 hours  Intake/Output Summary (Last 24 hours) at 12/30/2022 2235 Last data filed at 12/30/2022 2142 Gross per 24 hour  Intake 150 ml  Output 100 ml  Net 50 ml    Labs/Imaging Results for orders placed or performed during the hospital encounter of 12/30/22 (from the past 48 hour(s))  Comprehensive metabolic panel     Status: Abnormal   Collection Time: 12/30/22  6:46 PM  Result Value Ref Range   Sodium 135 135 - 145 mmol/L   Potassium 2.2 (LL) 3.5 - 5.1 mmol/L    Comment: CRITICAL RESULT CALLED TO, READ BACK BY AND VERIFIED WITH W.Melo Stauber,RN @2008  12/30/2022 [email or web address].J   Chloride 104 98 - 111 mmol/L   CO2 22 22 - 32 mmol/L   Glucose, Bld 112 (H) 70 - 99 mg/dL    Comment: Glucose reference range applies only to samples taken after fasting for at least 8 hours.   BUN 11 8 - 23 mg/dL   Creatinine, Ser 6.96 (H) 0.61 - 1.24 mg/dL   Calcium 7.8 (L) 8.9 - 10.3 mg/dL   Total Protein 6.6 6.5 - 8.1 g/dL   Albumin 2.1 (L) 3.5 - 5.0 g/dL   AST 35 15 - 41 U/L   ALT 17 0 - 44 U/L   Alkaline Phosphatase 68 38 - 126 U/L   Total Bilirubin 0.5 0.3 - 1.2 mg/dL   GFR, Estimated 41 (L) >60 mL/min    Comment: (NOTE) Calculated using the CKD-EPI Creatinine Equation (2021)    Anion gap 9 5 - 15    Comment: Performed at [facility], [address], [place], [place] [address]  Lactic acid, plasma      Status: Abnormal   Collection Time: 12/30/22  6:46 PM  Result Value Ref Range   Lactic Acid, Venous 2.1 (HH) 0.5 - 1.9 mmol/L    Comment: CRITICAL RESULT CALLED TO, READ BACK BY AND VERIFIED WITH W.Kaian Fahs,RN @2008  12/30/2022 [email or web address] Performed at [facility] Lab, [address] [place], [place] [address]   CBC with Differential     Status: Abnormal   Collection Time: 12/30/22  6:46 PM  Result Value Ref Range   WBC 5.3 4.0 - 10.5 K/uL   RBC 2.66 (L) 4.22 - 5.81 MIL/uL   Hemoglobin 5.5 (LL) 13.0 - 17.0 g/dL    Comment: REPEATED TO VERIFY Reticulocyte Hemoglobin testing may be clinically indicated, consider ordering this additional test [id number] THIS CRITICAL RESULT HAS VERIFIED AND BEEN CALLED TO B OLDLAND,RN BY WALTER BOND ON 05 28 2024 AT 1939, AND HAS BEEN READ BACK.  HCT 19.8 (L) 39.0 - 52.0 %   MCV 74.4 (L) 80.0 - 100.0 fL   MCH 20.7 (L) 26.0 - 34.0 pg   MCHC 27.8 (L) 30.0 - 36.0 g/dL   RDW 16.1 (H) 09.6 - 04.5 %   Platelets 311 150 - 400 K/uL   nRBC 0.0 0.0 - 0.2 %   Neutrophils Relative % 58 %   Neutro Abs 3.0 1.7 - 7.7 K/uL   Lymphocytes Relative 36 %   Lymphs Abs 1.9 0.7 - 4.0 K/uL   Monocytes Relative 6 %   Monocytes Absolute 0.3 0.1 - 1.0 K/uL   Eosinophils Relative 0 %   Eosinophils Absolute 0.0 0.0 - 0.5 K/uL   Basophils Relative 0 %   Basophils Absolute 0.0 0.0 - 0.1 K/uL   Immature Granulocytes 0 %   Abs Immature Granulocytes 0.02 0.00 - 0.07 K/uL    Comment: Performed at [facility], [address], [place], [place] [address]  Protime-INR     Status: None   Collection Time: 12/30/22  6:46 PM  Result Value Ref Range   Prothrombin Time 15.1 11.4 - 15.2 seconds   INR 1.2 0.8 - 1.2    Comment: (NOTE) INR goal varies based on device and disease states. Performed at [facility], [address] [place], [place] [address]   Magnesium     Status: Abnormal   Collection Time: 12/30/22  6:46 PM  Result Value Ref Range   Magnesium 1.4 (L) 1.7  - 2.4 mg/dL    Comment: Performed at [facility], [address], [place], [place] [address]  TSH     Status: None   Collection Time: 12/30/22  6:46 PM  Result Value Ref Range   TSH 2.741 0.350 - 4.500 uIU/mL    Comment: Performed by a 3rd Generation assay with a functional sensitivity of <=0.01 uIU/mL. Performed at [facility], [address], [place], [place] [address]   ABO/Rh     Status: None   Collection Time: 12/30/22  6:46 PM  Result Value Ref Range   ABO/RH(D)      O POS Performed at [facility], [address] [place], [place] [address]   Troponin I (High Sensitivity)     Status: Abnormal   Collection Time: 12/30/22  6:46 PM  Result Value Ref Range   Troponin I (High Sensitivity) 18 (H) <18 ng/L    Comment: (NOTE) Elevated high sensitivity troponin I (hsTnI) values and significant  changes across serial measurements may suggest ACS but many other  chronic and acute conditions are known to elevate hsTnI results.  Refer to the "Links" section for chest pain algorithms and additional  guidance. Performed at [facility], [address] [place], [place] [address]   CK     Status: Abnormal   Collection Time: 12/30/22  6:46 PM  Result Value Ref Range   Total CK 45 (L) 49 - 397 U/L    Comment: Performed at [facility], [address] [place], [place] [address]  Type and screen [facility]     Status: None (Preliminary result)   Collection Time: 12/30/22  8:05 PM  Result Value Ref Range   ABO/RH(D) O POS    Antibody Screen NEG    Sample Expiration 01/02/2023,2359    Unit Number [id number]    Blood Component Type RBC LR PHER2    Unit division 00  Status of Unit ALLOCATED    Transfusion Status OK TO TRANSFUSE    Crossmatch Result Compatible    Unit Number [id number]    Blood Component Type RED CELLS,LR    Unit division 00    Status of Unit ISSUED    Transfusion Status OK TO TRANSFUSE    Crossmatch Result       Compatible Performed at [facility], [address], [place], [place] [address]   Prepare RBC (crossmatch)     Status: None   Collection Time: 12/30/22  8:15 PM  Result Value Ref Range   Order Confirmation      ORDER PROCESSED BY BLOOD BANK Performed at [facility], [address] [place], [place] [address]   Lactic acid, plasma     Status: Abnormal   Collection Time: 12/30/22  8:38 PM  Result Value Ref Range   Lactic Acid, Venous 2.3 (HH) 0.5 - 1.9 mmol/L    Comment: CRITICAL VALUE NOTED.  VALUE IS CONSISTENT WITH PREVIOUSLY REPORTED AND CALLED VALUE. Performed at [facility], [address], [place], [place] [address]   Urinalysis, w/ Reflex to Culture (Infection Suspected) -Urine, Clean Catch     Status: Abnormal   Collection Time: 12/30/22  9:06 PM  Result Value Ref Range   Specimen Source URINE, CATHETERIZED    Color, Urine YELLOW YELLOW   APPearance HAZY (A) CLEAR   Specific Gravity, Urine 1.005 1.005 - 1.030   pH 6.0 5.0 - 8.0   Glucose, UA NEGATIVE NEGATIVE mg/dL   Hgb urine dipstick SMALL (A) NEGATIVE   Bilirubin Urine NEGATIVE NEGATIVE   Ketones, ur NEGATIVE NEGATIVE mg/dL   Protein, ur NEGATIVE NEGATIVE mg/dL   Nitrite NEGATIVE NEGATIVE   Leukocytes,Ua LARGE (A) NEGATIVE   RBC / HPF 0-5 0 - 5 RBC/hpf   WBC, UA 21-50 0 - 5 WBC/hpf    Comment:        Reflex urine culture not performed if WBC <=10, OR if Squamous epithelial cells >5. If Squamous epithelial cells >5 suggest recollection.    Bacteria, UA MANY (A) NONE SEEN   Squamous Epithelial / HPF 0-5 0 - 5 /HPF   Hyaline Casts, UA PRESENT     Comment: Performed at [facility], [address], [place], [place] [address]  Creatinine, urine, random     Status: None   Collection Time: 12/30/22  9:06 PM  Result Value Ref Range   Creatinine, Urine 60 mg/dL    Comment: Performed at [facility], [address] [place], [place] [address]  Osmolality, urine     Status: Abnormal    Collection Time: 12/30/22  9:06 PM  Result Value Ref Range   Osmolality, Ur 208 (L) 300 - 900 mOsm/kg    Comment: Performed at [facility], [address] [place], [place] [address]  Sodium, urine, random     Status: None   Collection Time: 12/30/22  9:06 PM  Result Value Ref Range   Sodium, Ur 56 mmol/L    Comment: Performed at [facility], [address], [place], [place] [address]   DG Chest Portable 1 View  Result Date: 12/30/2022 CLINICAL DATA:  Lethargic Hypotension, lethargy and weakness x3 days. Seizure today. Hx of stroke, HTN. EXAM: PORTABLE CHEST 1 VIEW.  Patient is rotated. COMPARISON:  Chest x-ray 08/29/2022 FINDINGS: The heart and mediastinal contours are within normal limits. Aortic calcification. Developed retrocardiac airspace opacity.  No pulmonary edema. No pleural effusion. No pneumothorax. No acute osseous abnormality. IMPRESSION: 1. Developing retrocardiac airspace opacity with limited evaluation due to patient rotation. Recommend repeat PA and lateral view of the chest. 2.  Aortic Atherosclerosis (ICD10-I70.0). Electronically Signed   By: Tish Frederickson M.D.   On: 12/30/2022 19:50    Pending Labs Unresulted Labs (From admission, onward)     Start     Ordered   12/31/22 0500  Prealbumin  Tomorrow morning,   R       Question:  Specimen collection method  Answer:  IV Team=IV Team collect   12/30/22 2115   12/31/22 0000  Basic metabolic panel  Once,   STAT       Question:  Specimen collection method  Answer:  IV Team=IV Team collect   12/30/22 2135   12/31/22 0000  CBC  Once,   STAT       Question:  Specimen collection method  Answer:  IV Team=IV Team collect   12/30/22 2135   12/30/22 2200  Osmolality  Once,   AD        12/30/22 2200   12/30/22 2200  Iron and TIBC  Once,   R        12/30/22 2200   12/30/22 2200  Ferritin  Once,   R        12/30/22 2200   12/30/22 2135  Procalcitonin  Add-on,   AD       References:    Procalcitonin Lower  Respiratory Tract Infection AND Sepsis Procalcitonin Algorithm  Question:  Specimen collection method  Answer:  IV Team=IV Team collect   12/30/22 2134   12/30/22 2117  SARS Coronavirus 2 by RT PCR (hospital order, performed in [facility] hospital lab) *cepheid single result test* Anterior Nasal Swab  (Tier 2 - SARS Coronavirus 2 by RT PCR (hospital order, performed in [facility] hospital lab) *cepheid single result test*)  Once,   URGENT        12/30/22 2116   12/30/22 2115  Blood gas, venous  ONCE - STAT,   STAT       Question Answer Comment  Specimen collection method IV Team=IV Team collect   Release to patient Immediate      12/30/22 2115   12/30/22 2115  Ammonia  Once,   STAT       Question Answer Comment  Specimen collection method IV Team=IV Team collect   Release to patient Immediate      12/30/22 2115   12/30/22 2115  Vitamin B12  (Anemia Panel (PNL))  Once,   URGENT       Question:  Specimen collection method  Answer:  IV Team=IV Team collect   12/30/22 2115   12/30/22 2115  Folate  (Anemia Panel (PNL))  Once,   URGENT       Question:  Specimen collection method  Answer:  IV Team=IV Team collect   12/30/22 2115   12/30/22 2115  Reticulocytes  (Anemia Panel (PNL))  Once,   URGENT       Question:  Specimen collection method  Answer:  IV Team=IV Team collect   12/30/22 2115   12/30/22 2106  Urine Culture  Once,   R        12/30/22 2106   12/30/22 1818  Culture, blood (Routine x 2)  BLOOD CULTURE X 2,   R      12/30/22 1817  Vitals/Pain Today's Vitals   12/30/22 1914 12/30/22 2100 12/30/22 2115 12/30/22 2135  BP: 115/70 104/68 108/71 124/75  Pulse:  71 65 70  Resp:  18 11 16   Temp:   (!) 97.3 F (36.3 C) 97.8 F (36.6 C)  TempSrc:   Oral Temporal  SpO2:  100% 99% 100%  PainSc:        Isolation Precautions Airborne and Contact precautions  Medications Medications  potassium chloride 10 mEq in 100 mL IVPB (10 mEq Intravenous New Bag/Given  12/30/22 2141)  0.9 %  sodium chloride infusion (Manually program via Guardrails IV Fluids) (has no administration in time range)  magnesium sulfate IVPB 1 g 100 mL (has no administration in time range)  potassium chloride (KLOR-CON) packet 80 mEq (80 mEq Oral Given 12/30/22 2050)  magnesium sulfate IVPB 2 g 50 mL (0 g Intravenous Stopped 12/30/22 2142)    Mobility non-ambulatory      R Recommendations: See Admitting Provider Note  Report given to:   Additional Notes:  Pt from with daughter. Pt has hx of dementia.

## 2022-12-30 NOTE — Assessment & Plan Note (Signed)
Replace potassium and magnesium Patient is bradycardic chronically would not be candidate for beta-blocker.  Appreciate cardiology involvement

## 2022-12-30 NOTE — Consult Note (Addendum)
Cardiology Consultation   Patient ID: JODE LAY MRN: [id number]; DOB: 23-Dec-1945  Admit date: 12/30/2022 Date of Consult: 12/30/2022  PCP:  Hildred Priest, FNP   [facility] Providers Cardiologist:  Reatha Harps, MD        Patient Profile:   77 y.o. male with medical history significant of dementia BPH, history of stroke on aspirin with right residual deficit, history of seizure disorder, hypertension, PAD, and CHF chronic diastolic, NSVT, left carotid stenosis, history of bradycardia, who presents for subacute lethargy found to be anemic, hypokalemic, hypomagnesemic. Cardiology consulted for increased ectopy.  History of Present Illness:   76 y.o. male with medical history significant of dementia BPH, history of stroke on aspirin with right residual deficit, history of seizure disorder, hypertension, PAD, and CHF chronic diastolic, NSVT, left carotid stenosis, history of bradycardia, who presents for subacute lethargy found to be anemic, hypokalemic, hypomagnesemic. Cardiology consulted for increased ectopy.  Was in his usual state of health until about 4-5 days ago. Started appreciating more fatigue and lethargy. No other associated symptoms that he notes, no CP, SOB/DOE, orthopnea, PND, BLE edema, palpitations, presyncope, syncope, fever, chills, rigors, dysuria, flank/suprapubic pain, hematuria, hematochezia, or melena. His girlfriend noted that he wasn't getting better and was concerned that he may have a UTI so she called EMS. On EMS arrival, patient's BP was 78/40 and bradycardia to the 40s (unclear rhythm per report).  ED vitals show HR in the 50-70s sinus, BP in the 110-120s/60-70s, afebrile, 100% on RA. Labs notable for Hgb 5.5, K 2.2, Mag 1.4, Cr 1.7 (from 1.1 in 09/2022), lactic acid 2.3, and UA positive for pyuria and leukocytes, negative for nitrites, urine culture pending. Telemetry showed sinus rhythm with multiple runs of asymptomatic NSVT (longest ~10  seconds). He's now received Mag 2g and Kcl thus far with improvement in ectopy. He has been admitted to medicine for anemia workup and treatment. pRBC transfusions going now.  Recent cardiac monitor 09/10/22-09/30/22 (ordered to look for AF, not detected) showing minimum HR of 44bpm sinus bradycardia to maximum HR 166 sinus tachycardia with an average HR of 64 bpm. PVC burden 6% and PAC burden 4%.  Past Medical History:  Diagnosis Date   Arthritis    Chronic back pain    Duodenal adenoma    Gout    Hypertension    Stroke (HCC) 06/2020   Syncope and collapse     Past Surgical History:  Procedure Laterality Date   BIOPSY  07/13/2020   Procedure: BIOPSY;  Surgeon: Meryl Dare, MD;  Location: [facility] ENDOSCOPY;  Service: Endoscopy;;   BIOPSY  11/28/2020   Procedure: BIOPSY;  Surgeon: Lemar Lofty., MD;  Location: [facility] ENDOSCOPY;  Service: Gastroenterology;;   CARDIAC CATHETERIZATION  2015   [facility]    COLONOSCOPY WITH PROPOFOL N/A 07/13/2020   Procedure: COLONOSCOPY WITH PROPOFOL;  Surgeon: Meryl Dare, MD;  Location: [facility] ENDOSCOPY;  Service: Endoscopy;  Laterality: N/A;   COLONOSCOPY WITH PROPOFOL N/A 11/28/2020   Procedure: COLONOSCOPY WITH PROPOFOL;  Surgeon: Meridee Score Netty Starring., MD;  Location: [facility] ENDOSCOPY;  Service: Gastroenterology;  Laterality: N/A;   ENDOSCOPIC MUCOSAL RESECTION N/A 11/28/2020   Procedure: ENDOSCOPIC MUCOSAL RESECTION;  Surgeon: Meridee Score Netty Starring., MD;  Location: [facility] ENDOSCOPY;  Service: Gastroenterology;  Laterality: N/A;   ESOPHAGOGASTRODUODENOSCOPY N/A 07/13/2020   Procedure: ESOPHAGOGASTRODUODENOSCOPY (EGD);  Surgeon: Meryl Dare, MD;  Location: [facility] ENDOSCOPY;  Service: Endoscopy;  Laterality: N/A;   ESOPHAGOGASTRODUODENOSCOPY (EGD) WITH PROPOFOL  N/A 11/28/2020   Procedure: ESOPHAGOGASTRODUODENOSCOPY (EGD) WITH PROPOFOL;  Surgeon: Meridee Score Netty Starring., MD;  Location: [facility] ENDOSCOPY;  Service: Gastroenterology;  Laterality: N/A;    HEMOSTASIS CLIP PLACEMENT  11/28/2020   Procedure: HEMOSTASIS CLIP PLACEMENT;  Surgeon: Lemar Lofty., MD;  Location: [facility] ENDOSCOPY;  Service: Gastroenterology;;   KNEE SURGERY Right    POLYPECTOMY  07/13/2020   Procedure: POLYPECTOMY;  Surgeon: Meryl Dare, MD;  Location: [facility] ENDOSCOPY;  Service: Endoscopy;;   POLYPECTOMY  11/28/2020   Procedure: POLYPECTOMY;  Surgeon: Lemar Lofty., MD;  Location: [facility] ENDOSCOPY;  Service: Gastroenterology;;   SUBMUCOSAL LIFTING INJECTION  11/28/2020   Procedure: SUBMUCOSAL LIFTING INJECTION;  Surgeon: Lemar Lofty., MD;  Location: [facility] ENDOSCOPY;  Service: Gastroenterology;;   SUBMUCOSAL TATTOO INJECTION  11/28/2020   Procedure: SUBMUCOSAL TATTOO INJECTION;  Surgeon: Lemar Lofty., MD;  Location: [facility] ENDOSCOPY;  Service: Gastroenterology;;     Home Medications:  Prior to Admission medications   Medication Sig Start Date End Date Taking? Authorizing Provider  acetaminophen (TYLENOL) 325 MG tablet Take 1-2 tablets (325-650 mg total) by mouth every 4 (four) hours as needed for mild pain. 07/05/20  Yes Love, Evlyn Kanner, PA-C  allopurinol (ZYLOPRIM) 100 MG tablet Take 1 tablet (100 mg total) by mouth daily. TAKE 1 TABLET(100 MG) BY MOUTH DAILY 10/03/22  Yes Raulkar, Drema Pry, MD  amLODipine (NORVASC) 10 MG tablet Take 1 tablet (10 mg total) by mouth daily. 04/15/22  Yes Olive Bass, FNP  aspirin EC 81 MG tablet Take 1 tablet (81 mg total) by mouth daily. Patient taking differently: Take 81 mg by mouth in the morning. 07/18/20  Yes Love, Evlyn Kanner, PA-C  atorvastatin (LIPITOR) 40 MG tablet TAKE 1 TABLET BY MOUTH ONCE DAILY 04/10/22  Yes Raulkar, Drema Pry, MD  cilostazol (PLETAL) 50 MG tablet TAKE 1 TABLET BY MOUTH TWICE DAILY 06/18/22  Yes Raulkar, Drema Pry, MD  citalopram (CELEXA) 10 MG tablet Take 10 mg by mouth daily. 12/01/22  Yes [provider]  colchicine 0.6 MG tablet Take 0.6 mg by mouth daily. 12/01/22  Yes  [provider]  furosemide (LASIX) 20 MG tablet Take 1 tablet (20 mg total) by mouth every morning. Patient taking differently: Take 20 mg by mouth every other day. 09/12/22  Yes O'Neal, Ronnald Ramp, MD  levETIRAcetam (KEPPRA) 500 MG tablet Take 1 tablet (500 mg total) by mouth 2 (two) times daily. 08/05/22  Yes McCue, Shanda Bumps, NP  levofloxacin (LEVAQUIN) 500 MG tablet Take 500 mg by mouth daily. Take for 10 days 12/26/22 01/05/23 Yes [provider]  lisinopril-hydrochlorothiazide (ZESTORETIC) 10-12.5 MG tablet Take 1 tablet by mouth daily. 12/01/22  Yes [provider]  Multiple Vitamin (MULTIVITAMIN WITH MINERALS) TABS tablet Take 1 tablet by mouth daily. Patient taking differently: Take 1 tablet by mouth daily at 12 noon. 07/19/20  Yes Love, Evlyn Kanner, PA-C  pantoprazole (PROTONIX) 40 MG tablet TAKE 1 TABLET BY MOUTH TWICE DAILY 10/31/22  Yes Worthy Rancher B, FNP  QUEtiapine (SEROQUEL) 50 MG tablet Take 50 mg by mouth at bedtime. 09/01/22  Yes [provider]  tamsulosin (FLOMAX) 0.4 MG CAPS capsule Take 1 capsule (0.4 mg total) by mouth daily after supper. 09/04/22  Yes Rhetta Mura, MD  traMADol (ULTRAM) 50 MG tablet Take 1 tablet (50 mg total) by mouth 2 (two) times daily as needed. 10/03/22  Yes Raulkar, Drema Pry, MD  vitamin B-12 (CYANOCOBALAMIN) 1000 MCG tablet Take 1,000 mcg by mouth daily.  Yes [provider]  hydrocerin (EUCERIN) CREA Apply 1 application. topically 2 (two) times daily. Patient taking differently: Apply 1 application  topically 2 (two) times daily. Apply to both legs 12/10/21   Olive Bass, FNP    Inpatient Medications: Scheduled Meds:  sodium chloride   Intravenous Once   Continuous Infusions:  magnesium sulfate bolus IVPB 2 g (12/30/22 2049)   potassium chloride Stopped (12/30/22 2136)   PRN Meds:   Allergies:   No Known Allergies  Social History:   Social History   Socioeconomic History   Marital  status: Single    Spouse name: Not on file   Number of children: 1   Years of education: 12   Highest education level: 12th grade  Occupational History   Not on file  Tobacco Use   Smoking status: Every Day    Packs/day: 0.50    Years: 20.00    Additional pack years: 0.00    Total pack years: 10.00    Types: Cigarettes    Passive exposure: Current   Smokeless tobacco: Never   Tobacco comments:    Verified by Daughter - Blima Ledger, 2 cigarettes per day 10-23-22  Vaping Use   Vaping Use: Never used  Substance and Sexual Activity   Alcohol use: Not Currently    Comment: occasional   Drug use: No   Sexual activity: Not Currently  Other Topics Concern   Not on file  Social History Narrative   Lives with daughter, son in law and 2 grandchildren   Right Handed   Drinks 1 cup caffeine 4 times a week-ish   Social Determinants of Health   Financial Resource Strain: Low Risk  (03/31/2022)   Overall Financial Resource Strain (CARDIA)    Difficulty of Paying Living Expenses: Not hard at all  Food Insecurity: No Food Insecurity (09/05/2022)   Hunger Vital Sign    Worried About Running Out of Food in the Last Year: Never true    Ran Out of Food in the Last Year: Never true  Transportation Needs: No Transportation Needs (09/05/2022)   PRAPARE - Administrator, Civil Service (Medical): No    Lack of Transportation (Non-Medical): No  Physical Activity: Insufficiently Active (10/21/2022)   Exercise Vital Sign    Days of Exercise per Week: 1 day    Minutes of Exercise per Session: 10 min  Stress: Stress Concern Present (10/21/2022)   [employer] of Occupational Health - Occupational Stress Questionnaire    Feeling of Stress : To some extent  Social Connections: Socially Isolated (03/31/2022)   Social Connection and Isolation Panel [NHANES]    Frequency of Communication with Friends and Family: More than three times a week    Frequency of Social Gatherings with Friends  and Family: More than three times a week    Attends Religious Services: Never    Database administrator or Organizations: No    Attends Banker Meetings: Never    Marital Status: Divorced  Catering manager Violence: Not At Risk (03/31/2022)   Humiliation, Afraid, Rape, and Kick questionnaire    Fear of Current or Ex-Partner: No    Emotionally Abused: No    Physically Abused: No    Sexually Abused: No    Family History:   Family History  Problem Relation Age of Onset   Hypertension Mother    Colon cancer Neg Hx    Pancreatic cancer Neg Hx    Stomach cancer Neg  Hx    Esophageal cancer Neg Hx    Inflammatory bowel disease Neg Hx    Liver disease Neg Hx    Rectal cancer Neg Hx      ROS:  Please see the history of present illness.  All other ROS reviewed and negative.     Physical Exam/Data:   Vitals:   12/30/22 1914 12/30/22 2100 12/30/22 2115 12/30/22 2135  BP: 115/70 104/68 108/71 124/75  Pulse:  71 65 70  Resp:  18 11 16   Temp:   (!) 97.3 F (36.3 C) 97.8 F (36.6 C)  TempSrc:   Oral Temporal  SpO2:  100% 99% 100%    Intake/Output Summary (Last 24 hours) at 12/30/2022 2140 Last data filed at 12/30/2022 2136 Gross per 24 hour  Intake 100 ml  Output 100 ml  Net 0 ml      10/23/2022    3:15 PM 09/03/2022    5:00 AM 08/31/2022    6:00 AM  Last 3 Weights  Weight (lbs) 161 lb 12.8 oz 182 lb 15.7 oz 179 lb 0.2 oz  Weight (kg) 73.392 kg 83 kg 81.2 kg     There is no height or weight on file to calculate BMI.  General:  Laying flat in bed in NAD HEENT: normal Neck: no JVD Cardiac:  normal S1, S2; RRR; no murmur Lungs:  clear to auscultation bilaterally, no wheezing, rhonchi or rales Abd: soft, nontender, no hepatomegaly, no suprapubic pain, no CVA tenderness Ext: no edema Musculoskeletal:  No deformities, BUE and BLE strength normal and equal Skin: warm and dry  Neuro:  grossly alert and oriented Psych:  Normal affect   EKG:  The EKG was personally  reviewed and demonstrates:  sinus bradycardia at 55bpm Telemetry:  Telemetry was personally reviewed and demonstrates:  sinus rhythm with episodes of NSVT (longest ~10 seconds)  Relevant CV Studies:  TTE (06/25/20): 1. Left ventricular ejection fraction, by estimation, is 60 to 65%. The  left ventricle has normal function. The left ventricle has no regional  wall motion abnormalities. There is severe concentric left ventricular  hypertrophy. Left ventricular diastolic   parameters are consistent with Grade I diastolic dysfunction (impaired  relaxation).   2. Right ventricular systolic function is normal. The right ventricular  size is normal.   3. The mitral valve is normal in structure. Mild mitral valve  regurgitation. No evidence of mitral stenosis.   4. The aortic valve is normal in structure. Aortic valve regurgitation is  not visualized. No aortic stenosis is present.   5. The inferior vena cava is normal in size with greater than 50%  respiratory variability, suggesting right atrial pressure of 3 mmHg.   Cardiac monitor (09/10/22-09/30/22): Enrollment 09/10/2022-09/30/2022. Minimum heart rate 44 bpm (sinus bradycardia). Maximum heart rate 166 bpm (sinus tachycardia). Average heart rate 64 bpm (normal sinus rhythm). Non-sustained ventricular tachycardia was present. Supraventricular ectopy were occasional (4%). Ventricular ectopy were occasional (6%). No atrial fibrillation detected.    Impression: Non-sustained ventricular tachycardia was present.  Occasional PVCs (6% burden) and PACs (4% burden).  No atrial fibrillation.   Laboratory Data:  High Sensitivity Troponin:  No results for input(s): "TROPONINIHS" in the last 720 hours.   Chemistry Recent Labs  Lab 12/30/22 1846  NA 135  K 2.2*  CL 104  CO2 22  GLUCOSE 112*  BUN 11  CREATININE 1.68*  CALCIUM 7.8*  MG 1.4*  GFRNONAA 41*  ANIONGAP 9    Recent Labs  Lab  12/30/22 1846  PROT 6.6  ALBUMIN 2.1*  AST 35  ALT  17  ALKPHOS 68  BILITOT 0.5   Lipids No results for input(s): "CHOL", "TRIG", "HDL", "LABVLDL", "LDLCALC", "CHOLHDL" in the last 168 hours.  Hematology Recent Labs  Lab 12/30/22 1846  WBC 5.3  RBC 2.66*  HGB 5.5*  HCT 19.8*  MCV 74.4*  MCH 20.7*  MCHC 27.8*  RDW 19.9*  PLT 311   Thyroid  Recent Labs  Lab 12/30/22 1846  TSH 2.741    BNPNo results for input(s): "BNP", "PROBNP" in the last 168 hours.  DDimer No results for input(s): "DDIMER" in the last 168 hours.   Radiology/Studies:  DG Chest Portable 1 View  Result Date: 12/30/2022 CLINICAL DATA:  Lethargic Hypotension, lethargy and weakness x3 days. Seizure today. Hx of stroke, HTN. EXAM: PORTABLE CHEST 1 VIEW.  Patient is rotated. COMPARISON:  Chest x-ray 08/29/2022 FINDINGS: The heart and mediastinal contours are within normal limits. Aortic calcification. Developed retrocardiac airspace opacity. No pulmonary edema. No pleural effusion. No pneumothorax. No acute osseous abnormality. IMPRESSION: 1. Developing retrocardiac airspace opacity with limited evaluation due to patient rotation. Recommend repeat PA and lateral view of the chest. 2.  Aortic Atherosclerosis (ICD10-I70.0). Electronically Signed   By: Tish Frederickson M.D.   On: 12/30/2022 19:50     Assessment and Recommendations:   77 y.o. male with medical history significant of dementia BPH, history of stroke on aspirin with right residual deficit, history of seizure disorder, hypertension, PAD, and CHF chronic diastolic, NSVT, left carotid stenosis, history of bradycardia, who presents for subacute lethargy found to be anemic, hypokalemic, hypomagnesemic. Cardiology consulted for increased ectopy.  Originally presented with K 2.2 and Mag 1.4, suspect electrolyte disturbance as the primary cause of his increased ectopy. Now with partial repletion of his K and Mag, his burden of NSVT seems to have improved. Would continue to replete electrolytes to normal and continue  workup for his anemia of unclear etiology. He appears euvolemic on exam and his CXR shows no evidence of pulmonary edema or pleural effusion. Would hold off on restarting his aspirin for now (ordered for PAD and prior stroke) until we have a better understanding and adequate treatment of his anemia of 5.5.  Recommendations: - agree with KCl repletion, would recheck K again before additional repletion to ensure on track especially in the context of AKI; would recommend additional Mag repletion (will write for additional 1g Mag) - hold off on restarting lasix for now until K >3.5 again; once K at safe levels, then would restart at his euvolemic home dosing of 20mg  QD - if no hypotension overnight, then ok to restart home amlodipine 10mg  QD tomorrow; would hold on restarting home lisinopril-HCTZ until AKI resolves - ok to hold aspirin temporarily until Hgb back to normal levels; please ensure restart prior to discharge - continue home atorvastatin 40mg  QD - given repeatedly hypokalemic and hypomagnesmic, would benefit from discharge with additional standing KCl and Mag repletion depending on his daily repletion needs this hospitalization  Risk Assessment/Risk Scores:        New York Heart Association (NYHA) Functional Class NYHA Class I   For questions or updates, please contact [facility] Please consult [email or web address] for contact info under    Signed, Bella Kennedy, MD  12/30/2022 9:40 PM

## 2022-12-30 NOTE — Assessment & Plan Note (Signed)
In the setting of possibly infectious source anemia and electrolyte abnormalities.  With known dementia

## 2022-12-30 NOTE — Assessment & Plan Note (Addendum)
Continue given anemia hold aspirin

## 2022-12-30 NOTE — ED Notes (Signed)
POC Occult blood positive

## 2022-12-30 NOTE — Subjective & Objective (Signed)
Coming from home because patient is concerning that he has UTI has been lethargic for the past 4 days history of dementia to start with on arrival blood pressure 78/40 bradycardia down to 40s With prior history of strokes currently on aspirin and Plavix has known residual right-sided weakness and history of seizures has known history of dementia and but usually at baseline able to communicate now is more confused has recently been treated for UTI with Levaquin no fevers or cough at home

## 2022-12-31 ENCOUNTER — Observation Stay (HOSPITAL_COMMUNITY): Payer: Medicare Other

## 2022-12-31 ENCOUNTER — Other Ambulatory Visit: Payer: Self-pay

## 2022-12-31 ENCOUNTER — Inpatient Hospital Stay (HOSPITAL_COMMUNITY): Payer: Medicare Other

## 2022-12-31 DIAGNOSIS — U071 COVID-19: Secondary | ICD-10-CM | POA: Diagnosis present

## 2022-12-31 DIAGNOSIS — I5032 Chronic diastolic (congestive) heart failure: Secondary | ICD-10-CM | POA: Diagnosis present

## 2022-12-31 DIAGNOSIS — R9431 Abnormal electrocardiogram [ECG] [EKG]: Secondary | ICD-10-CM | POA: Diagnosis present

## 2022-12-31 DIAGNOSIS — I739 Peripheral vascular disease, unspecified: Secondary | ICD-10-CM | POA: Diagnosis present

## 2022-12-31 DIAGNOSIS — R110 Nausea: Secondary | ICD-10-CM | POA: Diagnosis not present

## 2022-12-31 DIAGNOSIS — I6522 Occlusion and stenosis of left carotid artery: Secondary | ICD-10-CM | POA: Diagnosis present

## 2022-12-31 DIAGNOSIS — N179 Acute kidney failure, unspecified: Secondary | ICD-10-CM | POA: Diagnosis present

## 2022-12-31 DIAGNOSIS — R001 Bradycardia, unspecified: Secondary | ICD-10-CM | POA: Diagnosis present

## 2022-12-31 DIAGNOSIS — Z7189 Other specified counseling: Secondary | ICD-10-CM | POA: Diagnosis not present

## 2022-12-31 DIAGNOSIS — N3000 Acute cystitis without hematuria: Secondary | ICD-10-CM | POA: Diagnosis present

## 2022-12-31 DIAGNOSIS — E785 Hyperlipidemia, unspecified: Secondary | ICD-10-CM | POA: Diagnosis present

## 2022-12-31 DIAGNOSIS — K31819 Angiodysplasia of stomach and duodenum without bleeding: Secondary | ICD-10-CM | POA: Diagnosis not present

## 2022-12-31 DIAGNOSIS — D62 Acute posthemorrhagic anemia: Secondary | ICD-10-CM | POA: Diagnosis present

## 2022-12-31 DIAGNOSIS — K3189 Other diseases of stomach and duodenum: Secondary | ICD-10-CM | POA: Diagnosis not present

## 2022-12-31 DIAGNOSIS — I499 Cardiac arrhythmia, unspecified: Secondary | ICD-10-CM | POA: Diagnosis not present

## 2022-12-31 DIAGNOSIS — E876 Hypokalemia: Secondary | ICD-10-CM | POA: Diagnosis present

## 2022-12-31 DIAGNOSIS — K317 Polyp of stomach and duodenum: Secondary | ICD-10-CM | POA: Diagnosis not present

## 2022-12-31 DIAGNOSIS — K449 Diaphragmatic hernia without obstruction or gangrene: Secondary | ICD-10-CM | POA: Diagnosis not present

## 2022-12-31 DIAGNOSIS — R338 Other retention of urine: Secondary | ICD-10-CM | POA: Diagnosis present

## 2022-12-31 DIAGNOSIS — F0390 Unspecified dementia without behavioral disturbance: Secondary | ICD-10-CM | POA: Diagnosis present

## 2022-12-31 DIAGNOSIS — I4819 Other persistent atrial fibrillation: Secondary | ICD-10-CM | POA: Diagnosis present

## 2022-12-31 DIAGNOSIS — Z7902 Long term (current) use of antithrombotics/antiplatelets: Secondary | ICD-10-CM

## 2022-12-31 DIAGNOSIS — Z515 Encounter for palliative care: Secondary | ICD-10-CM | POA: Diagnosis not present

## 2022-12-31 DIAGNOSIS — G9341 Metabolic encephalopathy: Secondary | ICD-10-CM | POA: Diagnosis present

## 2022-12-31 DIAGNOSIS — G40909 Epilepsy, unspecified, not intractable, without status epilepticus: Secondary | ICD-10-CM | POA: Diagnosis present

## 2022-12-31 DIAGNOSIS — I639 Cerebral infarction, unspecified: Secondary | ICD-10-CM | POA: Diagnosis present

## 2022-12-31 DIAGNOSIS — I69351 Hemiplegia and hemiparesis following cerebral infarction affecting right dominant side: Secondary | ICD-10-CM | POA: Diagnosis not present

## 2022-12-31 DIAGNOSIS — I671 Cerebral aneurysm, nonruptured: Secondary | ICD-10-CM | POA: Diagnosis present

## 2022-12-31 DIAGNOSIS — D509 Iron deficiency anemia, unspecified: Secondary | ICD-10-CM | POA: Diagnosis not present

## 2022-12-31 DIAGNOSIS — R4182 Altered mental status, unspecified: Secondary | ICD-10-CM

## 2022-12-31 DIAGNOSIS — G9340 Encephalopathy, unspecified: Secondary | ICD-10-CM | POA: Diagnosis not present

## 2022-12-31 DIAGNOSIS — R569 Unspecified convulsions: Secondary | ICD-10-CM | POA: Diagnosis not present

## 2022-12-31 DIAGNOSIS — I6389 Other cerebral infarction: Secondary | ICD-10-CM | POA: Diagnosis not present

## 2022-12-31 DIAGNOSIS — K31811 Angiodysplasia of stomach and duodenum with bleeding: Secondary | ICD-10-CM | POA: Diagnosis present

## 2022-12-31 DIAGNOSIS — I6302 Cerebral infarction due to thrombosis of basilar artery: Secondary | ICD-10-CM | POA: Diagnosis not present

## 2022-12-31 DIAGNOSIS — I6381 Other cerebral infarction due to occlusion or stenosis of small artery: Secondary | ICD-10-CM | POA: Diagnosis present

## 2022-12-31 DIAGNOSIS — L97519 Non-pressure chronic ulcer of other part of right foot with unspecified severity: Secondary | ICD-10-CM | POA: Diagnosis present

## 2022-12-31 DIAGNOSIS — R609 Edema, unspecified: Secondary | ICD-10-CM | POA: Diagnosis not present

## 2022-12-31 DIAGNOSIS — I130 Hypertensive heart and chronic kidney disease with heart failure and stage 1 through stage 4 chronic kidney disease, or unspecified chronic kidney disease: Secondary | ICD-10-CM | POA: Diagnosis present

## 2022-12-31 DIAGNOSIS — K2951 Unspecified chronic gastritis with bleeding: Secondary | ICD-10-CM | POA: Diagnosis present

## 2022-12-31 DIAGNOSIS — R410 Disorientation, unspecified: Secondary | ICD-10-CM | POA: Diagnosis not present

## 2022-12-31 DIAGNOSIS — I472 Ventricular tachycardia, unspecified: Secondary | ICD-10-CM | POA: Diagnosis present

## 2022-12-31 DIAGNOSIS — N1832 Chronic kidney disease, stage 3b: Secondary | ICD-10-CM | POA: Diagnosis present

## 2022-12-31 LAB — IRON AND TIBC
Iron: 27 ug/dL — ABNORMAL LOW (ref 45–182)
Saturation Ratios: 11 % — ABNORMAL LOW (ref 17.9–39.5)
TIBC: 242 ug/dL — ABNORMAL LOW (ref 250–450)
UIBC: 215 ug/dL

## 2022-12-31 LAB — COMPREHENSIVE METABOLIC PANEL
ALT: 14 U/L (ref 0–44)
AST: 40 U/L (ref 15–41)
Albumin: 2.3 g/dL — ABNORMAL LOW (ref 3.5–5.0)
Alkaline Phosphatase: 70 U/L (ref 38–126)
Anion gap: 9 (ref 5–15)
BUN: 10 mg/dL (ref 8–23)
CO2: 20 mmol/L — ABNORMAL LOW (ref 22–32)
Calcium: 7.9 mg/dL — ABNORMAL LOW (ref 8.9–10.3)
Chloride: 107 mmol/L (ref 98–111)
Creatinine, Ser: 1.35 mg/dL — ABNORMAL HIGH (ref 0.61–1.24)
GFR, Estimated: 53 mL/min — ABNORMAL LOW (ref >60)
Glucose, Bld: 85 mg/dL (ref 70–99)
Potassium: 3.6 mmol/L (ref 3.5–5.1)
Sodium: 136 mmol/L (ref 135–145)
Total Bilirubin: 0.6 mg/dL (ref 0.3–1.2)
Total Protein: 6.8 g/dL (ref 6.5–8.1)

## 2022-12-31 LAB — TYPE AND SCREEN
Antibody Screen: NEGATIVE
Unit division: 0

## 2022-12-31 LAB — BLOOD GAS, VENOUS
Acid-base deficit: 0.8 mmol/L (ref 0.0–2.0)
Bicarbonate: 21.6 mmol/L (ref 20.0–28.0)
Drawn by: 5287
O2 Saturation: 99.3 %
Patient temperature: 36.9
pCO2, Ven: 29 mmHg — ABNORMAL LOW (ref 44–60)
pH, Ven: 7.48 — ABNORMAL HIGH (ref 7.25–7.43)
pO2, Ven: 114 mmHg — ABNORMAL HIGH (ref 32–45)

## 2022-12-31 LAB — CBC
HCT: 31.8 % — ABNORMAL LOW (ref 39.0–52.0)
Hemoglobin: 9.9 g/dL — ABNORMAL LOW (ref 13.0–17.0)
MCH: 23.9 pg — ABNORMAL LOW (ref 26.0–34.0)
MCHC: 31.1 g/dL (ref 30.0–36.0)
MCV: 76.8 fL — ABNORMAL LOW (ref 80.0–100.0)
Platelets: 23 10*3/uL — CL (ref 150–400)
RBC: 4.14 MIL/uL — ABNORMAL LOW (ref 4.22–5.81)
RDW: 20.5 % — ABNORMAL HIGH (ref 11.5–15.5)
WBC: 5.3 10*3/uL (ref 4.0–10.5)
nRBC: 0 % (ref 0.0–0.2)

## 2022-12-31 LAB — ECHOCARDIOGRAM COMPLETE
AR max vel: 2.08 cm2
AV Area VTI: 2.01 cm2
AV Area mean vel: 1.98 cm2
AV Mean grad: 4 mmHg
AV Peak grad: 5.9 mmHg
Ao pk vel: 1.22 m/s
Area-P 1/2: 3.59 cm2
Calc EF: 59.1 %
Height: 68 in
MV VTI: 1.58 cm2
S' Lateral: 3.1 cm
Single Plane A2C EF: 56.6 %
Single Plane A4C EF: 57.6 %
Weight: 2638.47 oz

## 2022-12-31 LAB — MAGNESIUM: Magnesium: 1.9 mg/dL (ref 1.7–2.4)

## 2022-12-31 LAB — BPAM RBC
ISSUE DATE / TIME: 202405282111
Unit Type and Rh: 5100
Unit Type and Rh: 5100

## 2022-12-31 LAB — URINE CULTURE: Culture: <10000 — AB

## 2022-12-31 LAB — PREALBUMIN: Prealbumin: 7 mg/dL — ABNORMAL LOW (ref 18–38)

## 2022-12-31 LAB — RETICULOCYTES
Immature Retic Fract: 24.5 % — ABNORMAL HIGH (ref 2.3–15.9)
RBC.: 4.14 MIL/uL — ABNORMAL LOW (ref 4.22–5.81)
Retic Count, Absolute: 37.7 10*3/uL (ref 19.0–186.0)
Retic Ct Pct: 0.9 % (ref 0.4–3.1)

## 2022-12-31 LAB — PHOSPHORUS: Phosphorus: 2.4 mg/dL — ABNORMAL LOW (ref 2.5–4.6)

## 2022-12-31 LAB — CULTURE, BLOOD (ROUTINE X 2)

## 2022-12-31 LAB — TROPONIN I (HIGH SENSITIVITY): Troponin I (High Sensitivity): 16 ng/L (ref <18)

## 2022-12-31 LAB — FOLATE: Folate: 5.9 ng/mL — ABNORMAL LOW (ref >5.9)

## 2022-12-31 LAB — FERRITIN: Ferritin: 31 ng/mL (ref 24–336)

## 2022-12-31 LAB — OCCULT BLOOD, POC DEVICE: Fecal Occult Bld: POSITIVE — AB

## 2022-12-31 LAB — VITAMIN B12: Vitamin B-12: >7500 pg/mL — ABNORMAL HIGH (ref 180–914)

## 2022-12-31 LAB — OSMOLALITY: Osmolality: 296 mOsm/kg — ABNORMAL HIGH (ref 275–295)

## 2022-12-31 LAB — LACTIC ACID, PLASMA
Lactic Acid, Venous: 2 mmol/L (ref 0.5–1.9)
Lactic Acid, Venous: 1.5 mmol/L (ref 0.5–1.9)

## 2022-12-31 MED ORDER — DEXTROSE-SODIUM CHLORIDE 5-0.9 % IV SOLN: INTRAVENOUS | Status: DC

## 2022-12-31 MED ORDER — K PHOS MONO-SOD PHOS DI & MONO 155-852-130 MG PO TABS
500.0000 mg | ORAL_TABLET | Freq: Three times a day (TID) | ORAL | Status: AC
  Administered 2022-12-31 – 2023-01-01 (×3): 500 mg via ORAL
  Filled 2022-12-31 (×3): qty 2

## 2022-12-31 MED ORDER — ENSURE ENLIVE PO LIQD
237.0000 mL | Freq: Three times a day (TID) | ORAL | Status: DC
  Administered 2022-12-31 – 2023-01-05 (×11): 237 mL via ORAL

## 2022-12-31 MED ORDER — SODIUM CHLORIDE 0.9 % IV SOLN
750.0000 mg | Freq: Two times a day (BID) | INTRAVENOUS | Status: DC
  Administered 2022-12-31 – 2023-01-01 (×3): 750 mg via INTRAVENOUS
  Filled 2022-12-31 (×4): qty 7.5

## 2022-12-31 MED ORDER — SODIUM CHLORIDE 0.9 % IV BOLUS
500.0000 mL | Freq: Once | INTRAVENOUS | Status: AC
  Administered 2022-12-31: 500 mL via INTRAVENOUS

## 2022-12-31 MED ORDER — ADULT MULTIVITAMIN W/MINERALS CH
1.0000 | ORAL_TABLET | Freq: Every day | ORAL | Status: DC
  Administered 2023-01-01 – 2023-01-05 (×5): 1 via ORAL
  Filled 2022-12-31 (×5): qty 1

## 2022-12-31 MED ORDER — CHLORHEXIDINE GLUCONATE CLOTH 2 % EX PADS
6.0000 | MEDICATED_PAD | Freq: Every day | CUTANEOUS | Status: DC
  Administered 2022-12-31 – 2023-01-05 (×6): 6 via TOPICAL

## 2022-12-31 MED ORDER — STROKE: EARLY STAGES OF RECOVERY BOOK
Freq: Once | Status: AC
  Administered 2023-01-01: 1
  Filled 2022-12-31: qty 1

## 2022-12-31 NOTE — Consult Note (Addendum)
Consultation  Referring Provider:  TRH  Primary Care Physician:  Hildred Priest, FNP Primary Gastroenterologist:  Dr. Meridee Score       Reason for Consultation:     Acute on chronic anemia  LOS: 0 days          HPI:   Andres White is a 77 y.o. male with past medical history significant for dementia, BPH, history of stroke with right side weakness on aspirin and Plavix, seizure disorder, duodenal adenomas, hypertension, PAD, and CHF chronic diastolic, NSVT, left carotid stenosis, bradycardia presents for evaluation of anemia.  PREVIOUS GI WORKUP------------------------------------------------ upper and lower endoscopy 11/29/2019 with Dr. Russella Dar inpatient for IDA and heme positive stool - multiple colon polyps that were not removed due to poor preparation.  - 1 duodenal adenoma with two other polyps appearing adenomatous but were not removed.  Colonoscopy 11/28/2020 with Dr. Meridee Score -Hemorrhoids, stool in entire colon, Eight 3-10 mm tubular adenomas in descending colon, splenic flexure, transverse colon resected and retrieved.  Normal mucosa in entire examined colon otherwise.  EGD 11/28/2020:  -Dark mucosal changes consistent with iron deposition throughout distal oropharynx.   -No gross lesions in esophagus.  Moderate gastritis (reactive gastropathy, negative H. pylori).    -Single duodenal polyp proximal to ampulla resected via piecemeal mucosal resection and retrieved.  Clips placed and tattooed. Biopsy showed peptic duodenitis.  - Floppy minor papilla noted with semblance of nodularity.  Normal major papilla.   -Single duodenal polyp in D3.  Resected and retrieved.  Clips placed and tattooed. Biopsied as peptic duodenitis.   THIS ADMISSION-------------------------------------------------------------  Patient presented from home with increased confusion.  Daughter states over the last 4 days patient has become more lethargic than usual. They called home health nurse who  came to evaluate him yesterday for suspicion of UTI. While nurse was there daughter noticed that patient had a seizure. She states he stared off, did not follow commands and was excessively drooling. She reports this is his usual seizure activity when he has them and it lasted about 30 seconds and then patient returned to baseline. Daughter called EMS and patient was transported to ED. Blood pressure 78/40 bradycardia down to 40s  Patient states his last seizure was 2 years ago prior to his stroke. Has had one tonic-clonic seizure in the past (his first one) but the subsequent seizures daughter describes as more absence seizures.  She states he has had regular brown bowel movements. Has not been complaining of pain. Denies vomiting. No overt bleeding. Daughter denies weight loss  He takes ASA daily, no further NSAIDs. Last dose Plavix was 5/28 AM  Pertinent lab values -Fecal occult positive -Hgb 5.5 (baseline 7-8) -BUN 11, creatinine 1.68, GFR 41 -Potassium 2.2 -UA positive for pyuria and leukocytes, negative for nitrates.  Urine culture pending  Past Medical History:  Diagnosis Date   Arthritis    Chronic back pain    Duodenal adenoma    Gout    Hypertension    Stroke [facility]) 06/2020   Syncope and collapse     Surgical History:  He  has a past surgical history that includes Knee surgery (Right); Cardiac catheterization (2015); Colonoscopy with propofol (N/A, 07/13/2020); Esophagogastroduodenoscopy (N/A, 07/13/2020); polypectomy (07/13/2020); biopsy (07/13/2020); Colonoscopy with propofol (N/A, 11/28/2020); Esophagogastroduodenoscopy (egd) with propofol (N/A, 11/28/2020); Endoscopic mucosal resection (N/A, 11/28/2020); biopsy (11/28/2020); Submucosal lifting injection (11/28/2020); polypectomy (11/28/2020); Hemostasis clip placement (11/28/2020); and Submucosal tattoo injection (11/28/2020). Family History:  His family history includes  Hypertension in his mother. Social History:   reports that  he has been smoking cigarettes. He has a 10.00 pack-year smoking history. He has been exposed to tobacco smoke. He has never used smokeless tobacco. He reports that he does not currently use alcohol. He reports that he does not use drugs.  Prior to Admission medications   Medication Sig Start Date End Date Taking? Authorizing Provider  acetaminophen (TYLENOL) 325 MG tablet Take 1-2 tablets (325-650 mg total) by mouth every 4 (four) hours as needed for mild pain. 07/05/20  Yes Love, Evlyn Kanner, PA-C  allopurinol (ZYLOPRIM) 100 MG tablet Take 1 tablet (100 mg total) by mouth daily. TAKE 1 TABLET(100 MG) BY MOUTH DAILY 10/03/22  Yes Raulkar, Drema Pry, MD  amLODipine (NORVASC) 10 MG tablet Take 1 tablet (10 mg total) by mouth daily. 04/15/22  Yes Olive Bass, FNP  aspirin EC 81 MG tablet Take 1 tablet (81 mg total) by mouth daily. Patient taking differently: Take 81 mg by mouth in the morning. 07/18/20  Yes Love, Evlyn Kanner, PA-C  atorvastatin (LIPITOR) 40 MG tablet TAKE 1 TABLET BY MOUTH ONCE DAILY 04/10/22  Yes Raulkar, Drema Pry, MD  cilostazol (PLETAL) 50 MG tablet TAKE 1 TABLET BY MOUTH TWICE DAILY 06/18/22  Yes Raulkar, Drema Pry, MD  citalopram (CELEXA) 10 MG tablet Take 10 mg by mouth daily. 12/01/22  Yes [provider]  colchicine 0.6 MG tablet Take 0.6 mg by mouth daily. 12/01/22  Yes [provider]  furosemide (LASIX) 20 MG tablet Take 1 tablet (20 mg total) by mouth every morning. Patient taking differently: Take 20 mg by mouth every other day. 09/12/22  Yes O'Neal, Ronnald Ramp, MD  levETIRAcetam (KEPPRA) 500 MG tablet Take 1 tablet (500 mg total) by mouth 2 (two) times daily. 08/05/22  Yes McCue, Shanda Bumps, NP  levofloxacin (LEVAQUIN) 500 MG tablet Take 500 mg by mouth daily. Take for 10 days 12/26/22 01/05/23 Yes [provider]  lisinopril-hydrochlorothiazide (ZESTORETIC) 10-12.5 MG tablet Take 1 tablet by mouth daily. 12/01/22  Yes [provider]  Multiple  Vitamin (MULTIVITAMIN WITH MINERALS) TABS tablet Take 1 tablet by mouth daily. Patient taking differently: Take 1 tablet by mouth daily at 12 noon. 07/19/20  Yes Love, Evlyn Kanner, PA-C  pantoprazole (PROTONIX) 40 MG tablet TAKE 1 TABLET BY MOUTH TWICE DAILY 10/31/22  Yes Worthy Rancher B, FNP  QUEtiapine (SEROQUEL) 50 MG tablet Take 50 mg by mouth at bedtime. 09/01/22  Yes [provider]  tamsulosin (FLOMAX) 0.4 MG CAPS capsule Take 1 capsule (0.4 mg total) by mouth daily after supper. 09/04/22  Yes Rhetta Mura, MD  traMADol (ULTRAM) 50 MG tablet Take 1 tablet (50 mg total) by mouth 2 (two) times daily as needed. 10/03/22  Yes Raulkar, Drema Pry, MD  vitamin B-12 (CYANOCOBALAMIN) 1000 MCG tablet Take 1,000 mcg by mouth daily.   Yes [provider]  hydrocerin (EUCERIN) CREA Apply 1 application. topically 2 (two) times daily. Patient taking differently: Apply 1 application  topically 2 (two) times daily. Apply to both legs 12/10/21   Olive Bass, FNP    Current Facility-Administered Medications  Medication Dose Route Frequency Provider Last Rate Last Admin   0.9 %  sodium chloride infusion (Manually program via Guardrails IV Fluids)   Intravenous Once Doutova, Anastassia, MD       0.9 %  sodium chloride infusion   Intravenous Continuous Therisa Doyne, MD 75 mL/hr at 12/31/22 0257 New Bag at 12/31/22 [phone]  acetaminophen (TYLENOL) tablet 650 mg  650 mg Oral Q6H PRN Therisa Doyne, MD       Or   acetaminophen (TYLENOL) suppository 650 mg  650 mg Rectal Q6H PRN Doutova, Anastassia, MD       albuterol (PROVENTIL) (2.5 MG/3ML) 0.083% nebulizer solution 2.5 mg  2.5 mg Nebulization Q2H PRN Doutova, Anastassia, MD       allopurinol (ZYLOPRIM) tablet 100 mg  100 mg Oral Daily Doutova, Anastassia, MD       atorvastatin (LIPITOR) tablet 40 mg  40 mg Oral Daily Doutova, Anastassia, MD       cefTRIAXone (ROCEPHIN) 1 g in sodium chloride 0.9 % 100 mL IVPB  1 g Intravenous  QHS Therisa Doyne, MD   Stopped at 12/31/22 0429   Chlorhexidine Gluconate Cloth 2 % PADS 6 each  6 each Topical Q0600 Burnadette Pop, MD       citalopram (CELEXA) tablet 10 mg  10 mg Oral Daily Doutova, Anastassia, MD       HYDROcodone-acetaminophen (NORCO/VICODIN) 5-325 MG per tablet 1-2 tablet  1-2 tablet Oral Q4H PRN Adela Glimpse, Anastassia, MD       levETIRAcetam (KEPPRA) 750 mg in sodium chloride 0.9 % 100 mL IVPB  750 mg Intravenous Q12H Adhikari, Amrit, MD       ondansetron (ZOFRAN) tablet 4 mg  4 mg Oral Q6H PRN Therisa Doyne, MD       Or   ondansetron (ZOFRAN) injection 4 mg  4 mg Intravenous Q6H PRN Doutova, Anastassia, MD       pantoprazole (PROTONIX) EC tablet 40 mg  40 mg Oral BID Doutova, Anastassia, MD       QUEtiapine (SEROQUEL) tablet 50 mg  50 mg Oral QHS Doutova, Anastassia, MD       tamsulosin (FLOMAX) capsule 0.4 mg  0.4 mg Oral QPC supper Therisa Doyne, MD       traMADol (ULTRAM) tablet 50 mg  50 mg Oral BID PRN Therisa Doyne, MD        Allergies as of 12/30/2022   (No Known Allergies)    Review of Systems  Unable to perform ROS: Dementia       Physical Exam:  Vital signs in last 24 hours: Temp:  [96.5 F (35.8 C)-99.1 F (37.3 C)] 99.1 F (37.3 C) (05/29 0854) Pulse Rate:  [53-77] 56 (05/29 0359) Resp:  [11-22] 17 (05/29 0359) BP: (96-143)/(52-86) 122/52 (05/29 0854) SpO2:  [88 %-100 %] 99 % (05/29 0359) Weight:  [74.8 kg-76.4 kg] 74.8 kg (05/29 0500) Last BM Date : 12/29/22 Last BM recorded by nurses in past 5 days No data recorded  Physical Exam Constitutional:      Appearance: Normal appearance.  HENT:     Head: Normocephalic and atraumatic.     Nose: Nose normal. No congestion.     Mouth/Throat:     Mouth: Mucous membranes are moist.     Pharynx: Oropharynx is clear.  Eyes:     Extraocular Movements: Extraocular movements intact.     Comments: Conjunctival pallor   Cardiovascular:     Rate and Rhythm: Normal rate and  regular rhythm.  Pulmonary:     Effort: Pulmonary effort is normal. No respiratory distress.  Abdominal:     General: Bowel sounds are normal. There is no distension.     Palpations: Abdomen is soft. There is no mass.     Tenderness: There is no abdominal tenderness. There is no guarding or rebound.     Hernia:  No hernia is present.  Musculoskeletal:        General: Normal range of motion.     Cervical back: Normal range of motion and neck supple.     Comments: Bilateral pedal edema   Skin:    General: Skin is warm and dry.  Neurological:     General: No focal deficit present.     Mental Status: He is alert. Mental status is at baseline.  Psychiatric:        Mood and Affect: Mood normal.        Behavior: Behavior normal.        Thought Content: Thought content normal.        Judgment: Judgment normal.      LAB RESULTS: Recent Labs    12/30/22 1846  WBC 5.3  HGB 5.5*  HCT 19.8*  PLT 311   BMET Recent Labs    12/30/22 1846  NA 135  K 2.2*  CL 104  CO2 22  GLUCOSE 112*  BUN 11  CREATININE 1.68*  CALCIUM 7.8*   LFT Recent Labs    12/30/22 1846  PROT 6.6  ALBUMIN 2.1*  AST 35  ALT 17  ALKPHOS 68  BILITOT 0.5   PT/INR Recent Labs    12/30/22 1846  LABPROT 15.1  INR 1.2    STUDIES: CT HEAD WO CONTRAST ( )  Result Date: 12/31/2022 CLINICAL DATA:  77 year old male with delirium. EXAM: CT HEAD WITHOUT CONTRAST TECHNIQUE: Contiguous axial images were obtained from the base of the skull through the vertex without intravenous contrast. RADIATION DOSE REDUCTION: This exam was performed according to the departmental dose-optimization program which includes automated exposure control, adjustment of the mA and/or kV according to patient size and/or use of iterative reconstruction technique. COMPARISON:  Brain MRI 09/20/2020.  Head CT 08/29/2022. FINDINGS: Brain: Cavum septum pellucidum, normal variant. No ventriculomegaly. No midline shift, mass effect, or  evidence of intracranial mass lesion. No acute intracranial hemorrhage identified. Chronic encephalomalacia in the posterior right MCA territory with scattered small chronic infarcts in the bilateral thalami, bilateral cerebellum. Patchy and confluent bilateral cerebral white matter hypodensity. Stable gray-white matter differentiation throughout the brain. No cortically based acute infarct identified. Vascular: No suspicious intracranial vascular hyperdensity. Skull: Chronic lamina papyracea and left orbital floor fractures. No acute osseous abnormality identified. Sinuses/Orbits: Visualized paranasal sinuses and mastoids are stable and well aerated. Other: Chronic left orbital floor fracture. No acute orbit or scalp soft tissue finding identified. IMPRESSION: 1. No acute intracranial abnormality identified. Stable non contrast CT appearance of advanced chronic ischemic disease in the brain. 2. Chronic lamina papyracea and left orbital floor fractures. Electronically Signed   By: Odessa Fleming M.D.   On: 12/31/2022 05:10   DG Chest Portable 1 View  Result Date: 12/30/2022 CLINICAL DATA:  Lethargic Hypotension, lethargy and weakness x3 days. Seizure today. Hx of stroke, HTN. EXAM: PORTABLE CHEST 1 VIEW.  Patient is rotated. COMPARISON:  Chest x-ray 08/29/2022 FINDINGS: The heart and mediastinal contours are within normal limits. Aortic calcification. Developed retrocardiac airspace opacity. No pulmonary edema. No pleural effusion. No pneumothorax. No acute osseous abnormality. IMPRESSION: 1. Developing retrocardiac airspace opacity with limited evaluation due to patient rotation. Recommend repeat PA and lateral view of the chest. 2.  Aortic Atherosclerosis (ICD10-I70.0). Electronically Signed   By: Tish Frederickson M.D.   On: 12/30/2022 19:50      Patient Profile   77 year old male with medical history significant for dementia, CVA with residual right  sided weakness, seizures, bradycardia, CHF, carotid stenosis,  duodenal adenoma, NSVT admitted with AKI, severe elctrolye disturbances, acute on chronic microcytic anemia after family brought patient to ED with chief complaint of increased lethargy and confusion.    Impression    Acute on chronic anemia -Hgb 5.5 (baseline 7-8), MCV 74.4 - receiving 2 units PRBCs, repeat CBC not collected -Iron studies pending -Positive fecal occult -BUN 11, creatinine 1.68, GFR 41 Discussion with daughter reveals they are not interested in pursuing colonoscopy. I agree as prep may be too much for patient to handle. She is interested in pursuing EGD and VCE. Last dose Plavix was 5/28 AM. Will need to wait until hgb >7 and electrolyte abnormalities are corrected prior to proceeding. No overt bleeding at this time. Discussed patient is high risk for procedures with recent seizure activity. With extensive co morbidities patient would need procedures done at the hospital even as an outpatient.  Acute metabolic encephalopathy -UA positive for pyuria and leukocytes, negative for nitrates.  Urine culture pending -CT head shows advanced chronic ischemic disease, no acute abnormality.  Arrhythmia (ectopy) - evaluated by cardiology. Found to have rare PVCs and baseline bradycardia. They suspect arrhythmia is secondary to metabolic abnormalities  AKI -Likely in the setting of electrolyte abnormality/dehydration - Potassium 2.2 - Magnesium 1.4 - Sodium 125   Plan   - Consider EGD with placement of VCE once electrolyte abnormalities resolve and hgb is >7. Note that last dose of Plavix being yesterday AM (5/28).  Earliest procedure would be Friday 5/31. - Hold plavix - Protonix 40mg  IV BID - monitor for bleeding, no overt bleeding at this time - Continue daily CBC and transfuse as needed to maintain HGB > 7  - replenish potassium - I thoroughly discussed the procedure with the patient (at bedside) to include nature of the procedure, alternatives, benefits, and risks (including  but not limited to bleeding, infection, perforation, anesthesia/cardiac pulmonary complications).  Patient/daughter verbalized understanding and gave verbal consent to proceed with EGD   Thank you for your kind consultation, we will continue to follow.   Chelcie Estorga Leanna Sato  12/31/2022, 9:05 AM

## 2022-12-31 NOTE — Assessment & Plan Note (Signed)
Replace and recheck 

## 2022-12-31 NOTE — Consult Note (Signed)
Consultation Note Date: 12/31/2022   Patient Name: Andres White  DOB: [birth year]  MRN: [id number]  Age / Sex: 77 y.o., male  PCP: Hildred Priest, FNP Referring Physician: Burnadette Pop, MD  Reason for Consultation: Establishing goals of care  HPI/Patient Profile: 77 y.o. male  with past medical history of dementia, stroke with right sided weakness deficit, seizure disorder, hypertension, HFpEF, HTN, NSVT, left carotid stenosis, bradycardia, CKD stage 3b admitted on 12/30/2022 with increased confusion with concern for UTI, acute/subacute lacunar infarct, and possible seizure. Also +COVID.   Clinical Assessment and Goals of Care: Consult received and chart review completed. I met today with Andres White. He is lying in bed. He awakens to my voice greeting and returns my greeting. He appears to be resting comfortably. He is able to converse and communicate with me. He tells me that he is in the hospital and concern for stroke. He is unable to tell me the month or year but is overall very appropriate in conversation. He tells me that his daughter was here but had to go pick up her grandchildren. He reports that he lives with his daughter. He has no questions or concerns about his condition and he gives me permission to call and discuss with daughter, Andres White.   I called and spoke with Andres White. Andres White shares that her father seems much improved. She was present most of the day at the hospital. She will be present again tomorrow and we agree to meet together for conversation 01/01/23 1000 am. No further questions or concerns at this time.   Primary Decision Maker PATIENT with assistance from daughter    SUMMARY OF RECOMMENDATIONS   - Family meeting tomorrow 01/01/23 1000 am  Code Status/Advance Care Planning: Full code   Symptom Management:  Per attending, neurology.   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 The following aspects are pertinent.  Past Medical History:  Diagnosis Date   Arthritis    Chronic back pain    Duodenal adenoma    Gout    Hypertension    Stroke [facility]) 06/2020   Syncope and collapse    Social History   Socioeconomic History   Marital status: Single    Spouse name: Not on file   Number of children: 1   Years of education: 12   Highest education  level: 12th grade  Occupational History   Not on file  Tobacco Use   Smoking status: Every Day    Packs/day: 0.50    Years: 20.00    Additional pack years: 0.00    Total pack years: 10.00    Types: Cigarettes    Passive exposure: Current   Smokeless tobacco: Never   Tobacco comments:    Verified by Daughter - Blima Ledger, 2 cigarettes per day 10-23-22  Vaping Use   Vaping Use: Never used  Substance and Sexual Activity   Alcohol use: Not Currently    Comment: occasional   Drug use: No   Sexual activity: Not Currently  Other Topics Concern   Not on file  Social History Narrative   Lives with daughter, son in law and 2 grandchildren   Right Handed   Drinks 1 cup caffeine 4 times a week-ish   Social Determinants of Health   Financial Resource Strain: Low Risk  (03/31/2022)    Overall Financial Resource Strain (CARDIA)    Difficulty of Paying Living Expenses: Not hard at all  Food Insecurity: No Food Insecurity (09/05/2022)   Hunger Vital Sign    Worried About Running Out of Food in the Last Year: Never true    Ran Out of Food in the Last Year: Never true  Transportation Needs: No Transportation Needs (09/05/2022)   PRAPARE - Administrator, Civil Service (Medical): No    Lack of Transportation (Non-Medical): No  Physical Activity: Insufficiently Active (10/21/2022)   Exercise Vital Sign    Days of Exercise per Week: 1 day    Minutes of Exercise per Session: 10 min  Stress: Stress Concern Present (10/21/2022)   [employer] of Occupational Health - Occupational Stress Questionnaire    Feeling of Stress : To some extent  Social Connections: Socially Isolated (03/31/2022)   Social Connection and Isolation Panel [NHANES]    Frequency of Communication with Friends and Family: More than three times a week    Frequency of Social Gatherings with Friends and Family: More than three times a week    Attends Religious Services: Never    Database administrator or Organizations: No    Attends Engineer, structural: Never    Marital Status: Divorced   Family History  Problem Relation Age of Onset   Hypertension Mother    Colon cancer Neg Hx    Pancreatic cancer Neg Hx    Stomach cancer Neg Hx    Esophageal cancer Neg Hx    Inflammatory bowel disease Neg Hx    Liver disease Neg Hx    Rectal cancer Neg Hx    Scheduled Meds:  sodium chloride   Intravenous Once   atorvastatin  40 mg Oral Daily   Chlorhexidine Gluconate Cloth  6 each Topical Q0600   citalopram  10 mg Oral Daily   pantoprazole  40 mg Oral BID   QUEtiapine  50 mg Oral QHS   tamsulosin  0.4 mg Oral QPC supper   Continuous Infusions:  cefTRIAXone (ROCEPHIN)  IV Stopped (12/31/22 0429)   dextrose 5 % and 0.9 % NaCl     levETIRAcetam 750 mg (12/31/22 1101)   PRN  Meds:.acetaminophen **OR** acetaminophen, albuterol, HYDROcodone-acetaminophen, ondansetron **OR** ondansetron (ZOFRAN) IV, traMADol No Known Allergies Review of Systems  Constitutional:  Positive for activity change, appetite change and fatigue.  Respiratory:  Negative for shortness of breath.   Neurological:  Positive for weakness.    Physical  Exam Vitals and nursing note reviewed.  Constitutional:      General: He is not in acute distress.    Appearance: He is ill-appearing.  Cardiovascular:     Rate and Rhythm: Bradycardia present.  Pulmonary:     Effort: No tachypnea, accessory muscle usage or respiratory distress.  Abdominal:     Palpations: Abdomen is soft.  Neurological:     Mental Status: He is alert.     Comments: Mostly oriented; disoriented to date/time     Vital Signs: BP (!) 122/52 (BP Location: Right Arm)   Pulse (!) 56   Temp 99.1 F (37.3 C) (Axillary)   Resp 18   Ht 5\' 8"  (1.727 m)   Wt 74.8 kg   SpO2 99%   BMI 25.07 kg/m  Pain Scale: 0-10   Pain Score: 0-No pain   SpO2: SpO2: 99 % O2 Device:SpO2: 99 % O2 Flow Rate: .   IO: Intake/output summary:  Intake/Output Summary (Last 24 hours) at 12/31/2022 1328 Last data filed at 12/31/2022 0450 Gross per 24 hour  Intake 1856.9 ml  Output 1050 ml  Net 806.9 ml    LBM: Last BM Date : 12/29/22 Baseline Weight: Weight: 76.4 kg Most recent weight: Weight: 74.8 kg     Palliative Assessment/Data:     Time In: 1500  Time Total: 60 min  Greater than 50%  of this time was spent counseling and coordinating care related to the above assessment and plan.  Signed by: Yong Channel, NP Palliative Medicine Team Pager # [phone] (M-F 8a-5p) Team Phone # [phone] (Nights/Weekends)

## 2022-12-31 NOTE — Assessment & Plan Note (Signed)
Appears to be noninfected black eschar in place order wound care consult

## 2022-12-31 NOTE — Assessment & Plan Note (Signed)
-   treat with Rocephin         await results of urine culture and adjust antibiotic coverage as needed Difficulty with urination now status post indwelling Foley catheter

## 2022-12-31 NOTE — Assessment & Plan Note (Signed)
Family reports the past 4 weeks has been having left-sided weakness which is different at baseline patient has mobile right-sided weakness. CT head ordered but was delayed at this point will need MRI to further evaluate for any presence of CVA.  Discussed briefly with neurology if MRI or CT head abnormal will need official neurology consult

## 2022-12-31 NOTE — Consult Note (Signed)
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 Arevalo.  Wound bed: stable black eschar Drainage (amount, consistency, odor) None Periwound:intact Dressing procedure/placement/frequency: I have provided Nursing with conservative guidance for the care of this lesion using a daily soap and water cleanse of the foot, rinse and dry followed by painting the lesion with a povidone-iodine swabstick and allowing it to air dry. When dry, the ulcer is to be covered with dry gauze and secured with a few turns of Kerlix roll gauze, paper tape. Feet are to be laced into [employer]. A sacral silicone foam is to be placed for PI prevention. Patient is being turned and repositioned per house protocol.  Discussed with Bedside RN above via Secure Chat.  WOC nursing team will not follow, but will remain available to this patient, the nursing and medical teams.  Please re-consult if needed.  Thank you for inviting [place] to participate in this patient's Plan of Care.  Ladona Mow, MSN, RN, CNS, GNP, Leda Min, [employer], [employer] phone:  [phone]

## 2022-12-31 NOTE — Assessment & Plan Note (Signed)
Foley catheter was placed continue to monitor

## 2022-12-31 NOTE — Progress Notes (Signed)
Patient was being seen by the rounding physicians. Then patient was then getting labs followed by a swallow evaluation. EEG will attempt later when schedule permits.

## 2022-12-31 NOTE — Inpatient Diabetes Management (Signed)
Inpatient Diabetes Program Recommendations  AACE/ADA: New Consensus Statement on Inpatient Glycemic Control (2015)  Target Ranges:  Prepandial:   less than 140 mg/dL      Peak postprandial:   less than 180 mg/dL (1-2 hours)      Critically ill patients:  140 - 180 mg/dL   Lab Results  Component Value Date   GLUCAP 115 (H) 09/04/2022   HGBA1C 4.5 (L) 09/17/2020    Review of Glycemic Control  Latest Reference Range & Units 12/30/22 18:46  Glucose 70 - 99 mg/dL 295 (H)  (H): Data is abnormally high  Received consult for uncontrolled diabetes.  Reviewed chart; no history of diabetes.  May consider obtaining current A1C.  Thank you, Dulce Sellar, MSN, CDC[facility] Diabetes Coordinator Inpatient Diabetes Program [phone] (team pager from 8a-5p)

## 2022-12-31 NOTE — Progress Notes (Signed)
Initial Nutrition Assessment  DOCUMENTATION CODES:   Not applicable  INTERVENTION:  Provide Ensure Enlive po TID, each supplement provides 350 kcal and 20 grams of protein. Pt prefers chocolate.  Monitor magnesium, potassium, and phosphorus daily for at least 3 days, MD to replete as needed, as pt is at risk for refeeding syndrome.  Provide multivitamin with minerals po daily.  NUTRITION DIAGNOSIS:   Increased nutrient needs related to catabolic illness (HF, COVID, increased needs for wound healing) as evidenced by estimated needs.  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  5/29: s/p MRI brain with findings of acute to subacute lacunar infarct of right lower brainstem-pontomedullary junction and right medullary pyramid; s/p SLP evaluation with approval for regular texture diet with thin liquids  Pt assessed by GI today. Pt receiving 2 units pRBCs. Family not interested in pursuing colonoscopy. Plan for possible EGD and VCE once hgb >7 and electrolyte abnormalities corrected.  Met with pt and his daughter at bedside. Pt resting at time of RD assessment so daughter provided most of history with pt contributing. Daughter reports pt has a good appetite and intake at baseline. She reports he eats 3 meals per day plus also  drinks Ensure in addition. He typically eats chicken, steak, or fish with sides at meals. Pt reports he likes all foods. Unable to provide further details. Daughter reports he enjoys Ensure and drinks them regularly (amount varies day to day). Suspect decreased intake PTA with confusion. Pt denies food allergies or intolerances. Denies nausea, emesis, or abdominal pain. Pt reports having upper and lower dentures that he does not wear regularly so he has difficulty chewing. Plan is for diet to be advanced to dysphagia 3 (mechanical soft). Previously no diet order in place. Pt would like chocolate Ensure ordered here. Discussed importance of adequate intake of calories and protein at meals. Discussed increased needs.   Pt reports his UBW is around 165 lbs and he denies any weight loss. Per review of weight history in chart weights fluctuate between 73-85 kg in the past few months, but were 82-83 kg in 2023. If weight of 85.1 kg on 08/05/22 was correct, pt has lost 10.3 kg or 12.1% weight over the past almost 5 months, which is significant for time frame. Weight on 08/26/22 was 76.9 kg and suspect pt would not have had such a big weight change in short period of time. Both of these weights were outpatient neurology visits. Pt and daughter deny any weight loss, so unsure of accuracy of weight trend.  Medications reviewed and include: pantoprazole, Flomax, ceftriaxone, D5-NS at 75 mL/hour, Keppra  Labs reviewed: On 5/28 potassium 2.2, Creatinine 1.68, Magnesium 1.4, Hgb 9.9, HCT 31.8, Platelets 23  UOP: 1050 mL UOP overnight  I/O: +806.9 mL since admission  Discussed with RN.  NUTRITION - FOCUSED PHYSICAL EXAM:  Flowsheet Row Most Recent Value  Orbital Region  No depletion  Upper Arm Region Mild depletion  Thoracic and Lumbar Region No depletion  Buccal Region No depletion  Temple Region No depletion  Clavicle Bone Region No depletion  Clavicle and Acromion Bone Region No depletion  Scapular Bone  Region No depletion  Dorsal Hand Mild depletion  Patellar Region Mild depletion  Anterior Thigh Region Mild depletion  Posterior Calf Region Moderate depletion  Edema (RD Assessment) Mild  Hair Reviewed  Eyes Reviewed  Mouth Reviewed  [edentulous]  Skin Reviewed  Nails Reviewed       Diet Order:   Diet Order             DIET DYS 3 Room service appropriate? Yes; Fluid consistency: Thin  Diet effective now                  EDUCATION NEEDS:   Education needs have been addressed (Discussed importance of adequate intake of calories and protein.)  Skin:  Skin Assessment: Skin Integrity Issues: Skin Integrity Issues:: Unstageable Unstageable: right heel  Last BM:  12/29/22 per chart  Height:   Ht Readings from Last 1 Encounters:  12/31/22 5\' 8"  (1.727 m)   Weight:   Wt Readings from Last 1 Encounters:  12/31/22 74.8 kg   Ideal Body Weight:  70 kg  BMI:  Body mass index is 25.07 kg/m.  Estimated Nutritional Needs:   Kcal:  2000-2200  Protein:  100-110 grams  Fluid:  2-2.2 L/day  Letta Median, MS, RD, LDN, CNSC Pager number available on Amion

## 2022-12-31 NOTE — Assessment & Plan Note (Signed)
Noted drop in hemoglobin discussed with [facility] at this point they would like to stabilize patient electrolytes with will see in consult

## 2022-12-31 NOTE — H&P (Incomplete)
Andres White ZOX:[id number] DOB: Nov 23, 1945 DOA: 12/30/2022     PCP: Hildred Priest, FNP   Outpatient Specialists:  CARDS:  Dr. Reatha Harps, MD   NEurology     Dr. Pearlean Brownie    [facility]  Patient arrived to ER on 12/30/22 at 1801 Referred by Attending Rondel Baton, MD   Patient coming from:    home Lives  With family    Chief Complaint:   Chief Complaint  Patient presents with  . Hypotension    HPI: Andres White is a 77 y.o. male with medical history significant of dementia BPH, history of stroke with right side weakness on aspirin and Plavix with right residual deficit, history of seizure disorder, hypertension, PAD, and CHF chronic diastolic, NSVT, left carotid stenosis, history of bradycardia    Presented with increased confusion Coming from home because patient is concerning that he has UTI has been lethargic for the past 4 days history of dementia to start with on arrival blood pressure 78/40 bradycardia down to 40s With prior history of strokes currently on aspirin and Plavix has known residual right-sided weakness and history of seizures has known history of dementia and but usually at baseline able to communicate now is more confused has recently been treated for UTI with Levaquin no fevers or cough at home   Family reports left side has been weaker for the past 4 day Family states he had an episode of eyes rolling in the back of his head and he was drooling' have not had  a seizure for few years not since he had a stroke  Denies significant ETOH intake   Does not smoke   Lab Results  Component Value Date   SARSCOV2NAA NEGATIVE 08/29/2022   SARSCOV2NAA NEGATIVE 11/26/2020   SARSCOV2NAA NEGATIVE 09/16/2020   SARSCOV2NAA NEGATIVE 06/24/2020        Regarding pertinent Chronic problems:   Hyperlipidemia -  on statins Lipitor (atorvastatin)  Lipid Panel     Component Value Date/Time   CHOL 104 12/10/2021 1123   TRIG 93.0 12/10/2021 1123    HDL 50.50 12/10/2021 1123   CHOLHDL 2 12/10/2021 1123   VLDL 18.6 12/10/2021 1123   LDLCALC 35 12/10/2021 1123   PAD on Pletal   HTN on    chronic CHF diastolic/  - last echo 2021 And Lasix Recent Results (from the past 81191 hour(s))  ECHOCARDIOGRAM COMPLETE   Collection Time: 06/25/20 10:53 AM  Result Value   Weight 2,624   Height 67   BP 158/79   S' Lateral 2.34   Narrative      ECHOCARDIOGRAM REPORT       IMPRESSIONS    1. Left ventricular ejection fraction, by estimation, is 60 to 65%. The left ventricle has normal function. The left ventricle has no regional wall motion abnormalities. There is severe concentric left ventricular hypertrophy. Left ventricular diastolic  parameters are consistent with Grade I diastolic dysfunction (impaired relaxation).  2. Right ventricular systolic function is normal. The right ventricular size is normal.  3. The mitral valve is normal in structure. Mild mitral valve regurgitation. No evidence of mitral stenosis.  4. The aortic valve is normal in structure. Aortic valve regurgitation is not visualized. No aortic stenosis is present.  5. The inferior vena cava is normal in size with greater than 50% respiratory variability, suggesting right atrial pressure of 3 mmHg.         Prior stress test in 2022  was negative for ischemia.   Gout on allopurinol       Hx of CVA - with /out residual right side weakness deficits on Aspirin 81 mg,  Plavix     CKD stage IIIa- baseline Cr 1.0 CrCl cannot be calculated (Unknown ideal weight.).  Lab Results  Component Value Date   CREATININE 1.68 (H) 12/30/2022   CREATININE 1.07 09/04/2022   CREATININE 1.03 09/03/2022      BPH - on Flomax,        Dementia - on Seroquel    Chronic anemia - baseline hg Hemoglobin & Hematocrit  Recent Labs    09/03/22 0741 09/04/22 0436 12/30/22 1846  HGB 7.6* 8.0* 5.5*   Iron/TIBC/Ferritin/ %Sat    Component Value Date/Time   IRON 52 09/03/2022 0741    TIBC 237 (L) 09/03/2022 0741   FERRITIN 79 09/03/2022 0741   IRONPCTSAT 22 09/03/2022 0741   IRONPCTSAT 21 12/10/2021 1123     Seizure DO -     currently on Keppra    While in ER: Clinical Course as of 12/31/22 0001  Tue Dec 30, 2022  2108 Dr Adela Glimpse from hospitalist to admit.  Requesting that cardiology be consulted and troponin be sent. [RP]  2246 Dr. Juel Burrow from cardiology consulted.  Recommends replenishing the patient's electrolytes.  Does not feel that the patient will likely require any other acute interventions.  Will see the patient shortly. [RP]    Clinical Course User Index [RP] Rondel Baton, MD  Found anemic Manson Passey stool K 2.2 and Mag 1.2     Lab Orders         Culture, blood (Routine x 2)         Comprehensive metabolic panel         Lactic acid, plasma         CBC with Differential         Protime-INR         Urinalysis, w/ Reflex to Culture (Infection Suspected) -Urine, Clean Catch         Magnesium         TSH         POC occult blood, ED      CT HEAD ordered    CXR -  Developing retrocardiac airspace opacity with limited evaluation due to patient rotation. Recommend repeat PA and lateral view of the chest.     Following Medications were ordered in ER: Medications  potassium chloride 10 mEq in 100 mL IVPB (10 mEq Intravenous New Bag/Given 12/30/22 2043)  0.9 %  sodium chloride infusion (Manually program via Guardrails IV Fluids) (has no administration in time range)  magnesium sulfate IVPB 2 g 50 mL (2 g Intravenous New Bag/Given 12/30/22 2049)  potassium chloride (KLOR-CON) packet 80 mEq (80 mEq Oral Given 12/30/22 2050)    _______________________________________________________ ER Provider Called:   Cardiology   Dr. Juel Burrow They Recommend admit to medicine   Seen in ER   ED Triage Vitals  Enc Vitals Group     BP 12/30/22 1810 (!) 96/58     Pulse Rate 12/30/22 1810 (!) 53     Resp 12/30/22 1810 17     Temp 12/30/22 1900 (!) 96.5 F (35.8 C)      Temp Source 12/30/22 1900 Rectal     SpO2 12/30/22 1809 (!) 88 %     Weight --      Height --      Head Circumference --  Peak Flow --      Pain Score 12/30/22 1816 0     Pain Loc --      Pain Edu? --      Excl. in GC? --   TMAX(24)@     _________________________________________ Significant initial  Findings: Abnormal Labs Reviewed  COMPREHENSIVE METABOLIC PANEL - Abnormal; Notable for the following components:      Result Value   Potassium 2.2 (*)    Glucose, Bld 112 (*)    Creatinine, Ser 1.68 (*)    Calcium 7.8 (*)    Albumin 2.1 (*)    GFR, Estimated 41 (*)    All other components within normal limits  LACTIC ACID, PLASMA - Abnormal; Notable for the following components:   Lactic Acid, Venous 2.1 (*)    All other components within normal limits  CBC WITH DIFFERENTIAL/PLATELET - Abnormal; Notable for the following components:   RBC 2.66 (*)    Hemoglobin 5.5 (*)    HCT 19.8 (*)    MCV 74.4 (*)    MCH 20.7 (*)    MCHC 27.8 (*)    RDW 19.9 (*)    All other components within normal limits  MAGNESIUM - Abnormal; Notable for the following components:   Magnesium 1.4 (*)    All other components within normal limits      _________________________ Troponin   Cardiac Panel (last 3 results) Recent Labs    12/30/22 1846  CKTOTAL 45*  TROPONINIHS 18*     ECG: Ordered Personally reviewed and interpreted by me showing: HR : 55 Rhythm:Sinus rhythm Inferior infarct, old Anterior infarct, old Prolonged QT interval QTC 554   COVID-19 Labs  No results for input(s): "DDIMER", "FERRITIN", "LDH", "CRP" in the last 72 hours.  Lab Results  Component Value Date   SARSCOV2NAA NEGATIVE 08/29/2022   SARSCOV2NAA NEGATIVE 11/26/2020   SARSCOV2NAA NEGATIVE 09/16/2020   SARSCOV2NAA NEGATIVE 06/24/2020     The recent clinical data is shown below. Vitals:   12/30/22 1845 12/30/22 1900 12/30/22 1900 12/30/22 1914  BP:    115/70  Pulse:      Resp: 17 (!) 22     Temp:   (!) 96.5 F (35.8 C)   TempSrc:   Rectal   SpO2:         WBC     Component Value Date/Time   WBC 5.3 12/30/2022 1846   LYMPHSABS 1.9 12/30/2022 1846   LYMPHSABS 2.9 03/23/2014 2009   MONOABS 0.3 12/30/2022 1846   MONOABS 0.5 03/23/2014 2009   EOSABS 0.0 12/30/2022 1846   EOSABS 0.1 03/23/2014 2009   BASOSABS 0.0 12/30/2022 1846   BASOSABS 0.0 03/23/2014 2009    Lactic Acid, Venous    Component Value Date/Time   LATICACIDVEN 2.1 (HH) 12/30/2022 1846      Lactic Acid, Venous    Component Value Date/Time   LATICACIDVEN 2.1 (HH) 12/30/2022 1846    Procalcitonin *** Ordered      UA *** no evidence of UTI  ***Pending ***not ordered   Urine analysis:    Component Value Date/Time   COLORURINE YELLOW 08/29/2022 1728   APPEARANCEUR CLOUDY (A) 08/29/2022 1728   APPEARANCEUR Cloudy (A) 08/26/2022 1407   LABSPEC 1.010 08/29/2022 1728   LABSPEC 1.010 03/23/2014 2009   PHURINE 5.5 08/29/2022 1728   GLUCOSEU NEGATIVE 08/29/2022 1728   GLUCOSEU Negative 03/23/2014 2009   HGBUR TRACE (A) 08/29/2022 1728   BILIRUBINUR NEGATIVE 08/29/2022 1728   BILIRUBINUR Negative 08/26/2022 1407  BILIRUBINUR Negative 03/23/2014 2009   KETONESUR NEGATIVE 08/29/2022 1728   PROTEINUR NEGATIVE 08/29/2022 1728   NITRITE NEGATIVE 08/29/2022 1728   LEUKOCYTESUR LARGE (A) 08/29/2022 1728   LEUKOCYTESUR Negative 03/23/2014 2009    Results for orders placed or performed during the hospital encounter of 08/29/22  Blood culture (routine x 2)     Status: None   Collection Time: 08/29/22  5:14 PM   Specimen: BLOOD RIGHT FOREARM  Result Value Ref Range Status   Specimen Description   Final    BLOOD RIGHT FOREARM Performed at [facility], [address] [place], [place] [address]    Special Requests   Final    BOTTLES DRAWN AEROBIC AND ANAEROBIC Blood Culture results may not be optimal due to an inadequate volume of blood received in culture bottles Performed at [facility], [address] [place], [place] [address]    Culture   Final    NO GROWTH 5 DAYS Performed at [facility], [address], [place], [place] [address]    Report Status 09/03/2022 FINAL  Final  Blood culture (routine x 2)     Status: None   Collection Time: 08/29/22  5:23 PM   Specimen: Left Antecubital; Blood  Result Value Ref Range Status   Specimen Description   Final    LEFT ANTECUBITAL BLOOD Performed at [facility], [address] [place], [place] [address]    Special Requests   Final    BOTTLES DRAWN AEROBIC AND ANAEROBIC Blood Culture adequate volume Performed at [facility], [address] [place], [place] [address]    Culture   Final    NO GROWTH 5 DAYS Performed at [facility], [address], [place], [place] [address]    Report Status 09/03/2022 FINAL  Final  Resp panel by RT-PCR (RSV, Flu A&B, Covid) Anterior Nasal Swab     Status: None   Collection Time: 08/29/22  5:28 PM   Specimen: Anterior Nasal Swab    ________________________________________________________________  Arterial ***Venous  Blood Gas result:  pH *** pCO2 ***; pO2 ***;     %O2 Sat ***.  ABG    Component Value Date/Time   PHART 7.43 08/30/2022 0835   PCO2ART 32 08/30/2022 0835   PO2ART 126 (H) 08/30/2022 0835   HCO3 21.4 08/30/2022 0835   TCO2 29 08/29/2022 1734   ACIDBASEDEF 2.3 (H) 08/30/2022 0835   O2SAT 100 08/30/2022 0835     __________________________________________________________ Recent Labs  Lab 12/30/22 1846  NA 135  K 2.2*  CO2 22  GLUCOSE 112*  BUN 11  CREATININE 1.68*  CALCIUM 7.8*  MG 1.4*    Cr    Up from baseline see below Lab Results  Component Value Date   CREATININE 1.68 (H) 12/30/2022   CREATININE 1.07 09/04/2022   CREATININE 1.03 09/03/2022    Recent Labs  Lab 12/30/22 1846  AST 35  ALT 17  ALKPHOS 68  BILITOT 0.5  PROT 6.6  ALBUMIN 2.1*   Lab Results  Component Value Date   CALCIUM  7.8 (L) 12/30/2022   PHOS 2.1 (L) 09/04/2022          Plt: Lab Results  Component Value Date   PLT 311 12/30/2022         Recent Labs  Lab 12/30/22 1846  WBC 5.3  NEUTROABS 3.0  HGB 5.5*  HCT 19.8*  MCV 74.4*  PLT  311    HG/HCT * stable,  Down *Up from baseline see below    Component Value Date/Time   HGB 5.5 (LL) 12/30/2022 1846   HGB 9.7 (L) 08/26/2022 1407   HCT 19.8 (L) 12/30/2022 1846   HCT 31.4 (L) 08/26/2022 1407   MCV 74.4 (L) 12/30/2022 1846   MCV 82 08/26/2022 1407   MCV 95 03/23/2014 2009      No results for input(s): "LIPASE", "AMYLASE" in the last 168 hours. No results for input(s): "AMMONIA" in the last 168 hours.    .lab  _______________________________________________ Hospitalist was called for admission for *** There are no diagnoses linked to this encounter.   The following Work up has been ordered so far:  Orders Placed This Encounter  Procedures  . Culture, blood (Routine x 2)  . DG Chest Portable 1 View  . Comprehensive metabolic panel  . Lactic acid, plasma  . CBC with Differential  . Protime-INR  . Urinalysis, w/ Reflex to Culture (Infection Suspected) -Urine, Clean Catch  . Magnesium  . TSH  . Notify physician (specify)  Specify: Notify provider for possible Code Sepsis  . Document height and weight  . Check Rectal Temperature  . Informed Consent Details: Physician/Practitioner Attestation; Transcribe to consent form and obtain patient signature  . Insert foley catheter  . Limited resuscitation (code)  . Consult to hospitalist  . POC occult blood, ED  . EKG 12-Lead  . Type and screen [facility]  . Prepare RBC (crossmatch)  . ABO/Rh     OTHER Significant initial  Findings:  labs showing:     DM  labs:  HbA1C: No results for input(s): "HGBA1C" in the last 8760 hours.     CBG (last 3)  No results for input(s): "GLUCAP" in the last 72 hours.        Cultures:    Component Value Date/Time    SDES  08/29/2022 1723    LEFT ANTECUBITAL BLOOD Performed at [facility], [address], [place], [place] [address]    SPECREQUEST  08/29/2022 1723    BOTTLES DRAWN AEROBIC AND ANAEROBIC Blood Culture adequate volume Performed at [facility], [address], [place], [place] [address]    CULT  08/29/2022 1723    NO GROWTH 5 DAYS Performed at [facility], [address] [place], [place] [address]    REPTSTATUS 09/03/2022 FINAL 08/29/2022 1723     Radiological Exams on Admission: DG Chest Portable 1 View  Result Date: 12/30/2022 CLINICAL DATA:  Lethargic Hypotension, lethargy and weakness x3 days. Seizure today. Hx of stroke, HTN. EXAM: PORTABLE CHEST 1 VIEW.  Patient is rotated. COMPARISON:  Chest x-ray 08/29/2022 FINDINGS: The heart and mediastinal contours are within normal limits. Aortic calcification. Developed retrocardiac airspace opacity. No pulmonary edema. No pleural effusion. No pneumothorax. No acute osseous abnormality. IMPRESSION: 1. Developing retrocardiac airspace opacity with limited evaluation due to patient rotation. Recommend repeat PA and lateral view of the chest. 2.  Aortic Atherosclerosis (ICD10-I70.0). Electronically Signed   By: Tish Frederickson M.D.   On: 12/30/2022 19:50   _______________________________________________________________________________________________________ Latest  Blood pressure 115/70, pulse (!) 53, temperature (!) 96.5 F (35.8 C), temperature source Rectal, resp. rate (!) 22, SpO2 100 %.   Vitals  labs and radiology finding personally reviewed  Review of Systems:    Pertinent positives include: ***  Constitutional:  No weight loss, night sweats, Fevers, chills, fatigue, weight loss  HEENT:  No headaches, Difficulty swallowing,Tooth/dental problems,Sore throat,  No sneezing, itching, ear ache, nasal congestion, post nasal drip,  Cardio-vascular:  No chest pain, Orthopnea, PND, anasarca, dizziness,  palpitations.no Bilateral lower extremity swelling  GI:  No heartburn, indigestion, abdominal pain, nausea, vomiting, diarrhea, change in bowel habits, loss of appetite, melena, blood in stool, hematemesis Resp:  no shortness of breath at rest. No dyspnea on exertion, No excess mucus, no productive cough, No non-productive cough, No coughing up of blood.No change in color of mucus.No wheezing. Skin:  no rash or lesions. No jaundice GU:  no dysuria, change in color of urine, no urgency or frequency. No straining to urinate.  No flank pain.  Musculoskeletal:  No joint pain or no joint swelling. No decreased range of motion. No back pain.  Psych:  No change in mood or affect. No depression or anxiety. No memory loss.  Neuro: no localizing neurological complaints, no tingling, no weakness, no double vision, no gait abnormality, no slurred speech, no confusion  All systems reviewed and apart from HOPI all are negative _______________________________________________________________________________________________ Past Medical History:   Past Medical History:  Diagnosis Date  . Arthritis   . Chronic back pain   . Duodenal adenoma   . Gout   . Hypertension   . Stroke (HCC) 06/2020  . Syncope and collapse       Past Surgical History:  Procedure Laterality Date  . BIOPSY  07/13/2020   Procedure: BIOPSY;  Surgeon: Meryl Dare, MD;  Location: [facility] ENDOSCOPY;  Service: Endoscopy;;  . BIOPSY  11/28/2020   Procedure: BIOPSY;  Surgeon: Lemar Lofty., MD;  Location: [facility] ENDOSCOPY;  Service: Gastroenterology;;  . CARDIAC CATHETERIZATION  2015   [facility]   . COLONOSCOPY WITH PROPOFOL N/A 07/13/2020   Procedure: COLONOSCOPY WITH PROPOFOL;  Surgeon: Meryl Dare, MD;  Location: [facility] ENDOSCOPY;  Service: Endoscopy;  Laterality: N/A;  . COLONOSCOPY WITH PROPOFOL N/A 11/28/2020   Procedure: COLONOSCOPY WITH PROPOFOL;  Surgeon: Meridee Score Netty Starring., MD;  Location: [facility] ENDOSCOPY;  Service:  Gastroenterology;  Laterality: N/A;  . ENDOSCOPIC MUCOSAL RESECTION N/A 11/28/2020   Procedure: ENDOSCOPIC MUCOSAL RESECTION;  Surgeon: Meridee Score Netty Starring., MD;  Location: [facility] ENDOSCOPY;  Service: Gastroenterology;  Laterality: N/A;  . ESOPHAGOGASTRODUODENOSCOPY N/A 07/13/2020   Procedure: ESOPHAGOGASTRODUODENOSCOPY (EGD);  Surgeon: Meryl Dare, MD;  [facility] END[facility]COPY;  Service: Endoscopy;  Laterality: N/A;  . ESOPHAGOGASTRODUODENOSCOPY (EGD) WITH PROPOFOL N/A 11/28/2020   Procedure: ESOPHAGOGASTRODUODENOSCOPY (EGD) WITH PROPOFOL;  Surgeon: Meridee Score Netty Starring., MD;  Location: [facility] ENDOSCOPY;  Service: Gastroenterology;  Laterality: N/A;  . HEMOSTASIS CLIP PLACEMENT  11/28/2020   Procedure: HEMOSTASIS CLIP PLACEMENT;  Surgeon: Lemar Lofty., MD;  Location: [facility] ENDOSCOPY;  Service: Gastroenterology;;  . KNEE SURGERY Right   . POLYPECTOMY  07/13/2020   Procedure: POLYPECTOMY;  Surgeon: Meryl Dare, MD;  Location: [facility] ENDOSCOPY;  Service: Endoscopy;;  . POLYPECTOMY  11/28/2020   Procedure: POLYPECTOMY;  Surgeon: Lemar Lofty., MD;  Location: [facility] ENDOSCOPY;  Service: Gastroenterology;;  . Brooke Dare INJECTION  11/28/2020   Procedure: SUBMUCOSAL LIFTING INJECTION;  Surgeon: Lemar Lofty., MD;  Location: [facility] ENDOSCOPY;  Service: Gastroenterology;;  . Sunnie Nielsen TATTOO INJECTION  11/28/2020   Procedure: SUBMUCOSAL TATTOO INJECTION;  Surgeon: Lemar Lofty., MD;  Location: [facility] ENDOSCOPY;  Service: Gastroenterology;;    Social History:  Ambulatory   wheelchair bound,      reports that he has been smoking cigarettes. He has a 10.00 pack-year smoking history. He has  been exposed to tobacco smoke. He has never used smokeless tobacco. He reports that he does not currently use alcohol. He reports that he does not use drugs.   Family History:  Family History  Problem Relation Age of Onset  . Hypertension Mother   . Colon cancer Neg Hx   .  Pancreatic cancer Neg Hx   . Stomach cancer Neg Hx   . Esophageal cancer Neg Hx   . Inflammatory bowel disease Neg Hx   . Liver disease Neg Hx   . Rectal cancer Neg Hx    ______________________________________________________________________________________________ Allergies: No Known Allergies   Prior to Admission medications   Medication Sig Start Date End Date Taking? Authorizing Provider  acetaminophen (TYLENOL) 325 MG tablet Take 1-2 tablets (325-650 mg total) by mouth every 4 (four) hours as needed for mild pain. 07/05/20   Love, Evlyn Kanner, PA-C  allopurinol (ZYLOPRIM) 100 MG tablet Take 1 tablet (100 mg total) by mouth daily. TAKE 1 TABLET(100 MG) BY MOUTH DAILY 10/03/22   Raulkar, Drema Pry, MD  amLODipine (NORVASC) 10 MG tablet Take 1 tablet (10 mg total) by mouth daily. 04/15/22   Olive Bass, FNP  aspirin EC 81 MG tablet Take 1 tablet (81 mg total) by mouth daily. Patient taking differently: Take 81 mg by mouth in the morning. 07/18/20   Love, Evlyn Kanner, PA-C  atorvastatin (LIPITOR) 40 MG tablet TAKE 1 TABLET BY MOUTH ONCE DAILY 04/10/22   Raulkar, Drema Pry, MD  cilostazol (PLETAL) 50 MG tablet TAKE 1 TABLET BY MOUTH TWICE DAILY 06/18/22   Raulkar, Drema Pry, MD  citalopram (CELEXA) 10 MG tablet Take 10 mg by mouth daily. 12/01/22   [provider]  colchicine 0.6 MG tablet Take 0.6 mg by mouth daily. 12/01/22   [provider]  furosemide (LASIX) 20 MG tablet Take 1 tablet (20 mg total) by mouth every morning. Patient taking differently: Take 20 mg by mouth every other day. 09/12/22   O'NealRonnald Ramp, MD  gabapentin (NEURONTIN) 300 MG capsule Take 300 mg by mouth 3 (three) times daily. 12/01/22   [provider]  hydrocerin (EUCERIN) CREA Apply 1 application. topically 2 (two) times daily. Patient taking differently: Apply 1 application  topically 2 (two) times daily. Apply to both legs 12/10/21   Olive Bass, FNP  levETIRAcetam  (KEPPRA) 500 MG tablet Take 1 tablet (500 mg total) by mouth 2 (two) times daily. 08/05/22   Ihor Austin, NP  lisinopril-hydrochlorothiazide (ZESTORETIC) 10-12.5 MG tablet Take 1 tablet by mouth daily. 12/01/22   [provider]  Multiple Vitamin (MULTIVITAMIN WITH MINERALS) TABS tablet Take 1 tablet by mouth daily. Patient taking differently: Take 1 tablet by mouth daily at 12 noon. 07/19/20   Love, Evlyn Kanner, PA-C  pantoprazole (PROTONIX) 40 MG tablet TAKE 1 TABLET BY MOUTH TWICE DAILY 10/31/22   Worthy Rancher B, FNP  QUEtiapine (SEROQUEL) 50 MG tablet Take 50 mg by mouth at bedtime. Patient not taking: Reported on 10/23/2022 09/01/22   [provider]  tamsulosin (FLOMAX) 0.4 MG CAPS capsule Take 1 capsule (0.4 mg total) by mouth daily after supper. 09/04/22   Rhetta Mura, MD  traMADol (ULTRAM) 50 MG tablet Take 1 tablet (50 mg total) by mouth 2 (two) times daily as needed. 10/03/22   Raulkar, Drema Pry, MD  vitamin B-12 (CYANOCOBALAMIN) 1000 MCG tablet Take 1,000 mcg by mouth daily.    [provider]    ___________________________________________________________________________________________________ Physical Exam:  12/30/2022    7:14 PM 12/30/2022    6:30 PM 12/30/2022    6:10 PM  Vitals with BMI  Systolic 115 110 96  Diastolic 70 53 58  Pulse   53     1. General:  in No ***Acute distress***increased work of breathing ***complaining of severe pain****agitated * Chronically ill *well *cachectic *toxic acutely ill -appearing 2. Psychological: Alert and *** Oriented 3. Head/ENT:   Moist *** Dry Mucous Membranes                          Head Non traumatic, neck supple                          Normal *** Poor Dentition 4. SKIN: normal *** decreased Skin turgor,  Skin clean Dry and intact no rash    5. Heart: Regular rate and rhythm no*** Murmur, no Rub or gallop 6. Lungs: ***Clear to auscultation bilaterally, no wheezes or crackles   7. Abdomen: Soft,  ***non-tender, Non distended *** obese ***bowel sounds present 8. Lower extremities: no clubbing, cyanosis, no ***edema 9. Neurologically Grossly intact, moving all 4 extremities equally *** strength 5 out of 5 in all 4 extremities cranial nerves II through XII intact 10. MSK: Normal range of motion    Chart has been reviewed  ______________________________________________________________________________________________  Assessment/Plan 77 y.o. male with medical history significant of dementia BPH, history of stroke on aspirin and Plavix with right residual deficit, history of seizure disorder, hypertension, PAD, and CHF chronic diastolic, NSVT, left carotid stenosis, history of bradycardia  Admitted for symptomatic anemia, hypokalemia hypomagnesemia, acute encephalopathy, bradycardia   Present on Admission: **None**     No problem-specific Assessment & Plan notes found for this encounter.    Other plan as per orders.  DVT prophylaxis:  SCD     Code Status:    Code Status: Partial Code DNR/DNI  as per patient   I had personally discussed CODE STATUS with patient and family  ACP has been reviewed ***   Family Communication:   Family not at  Bedside  plan of care was discussed on the phone with *** Son, Daughter, Wife, Husband, Sister, Brother , father, mother  Diet    Disposition Plan:   *** likely will need placement for rehabilitation                          Back to current facility when stable                            To home once workup is complete and patient is stable  ***Following barriers for discharge:                            Electrolytes corrected                               Anemia corrected                             Pain controlled with PO medications  Afebrile, white count improving able to transition to PO antibiotics                             Will need to be able to tolerate PO                            Will likely  need home health, home O2, set up                           Will need consultants to evaluate patient prior to discharge  ****EXPECT DC tomorrow       Consult Orders  (From admission, onward)           Start     Ordered   12/30/22 2016  Consult to hospitalist  Pg sent by deloris  Once       Provider:  (Not yet assigned)  Question Answer Comment  Place call to: [facility]   Reason for Consult Admit      12/30/22 2015                               Would benefit from PT/OT eval prior to DC  Ordered                   Swallow eval - SLP ordered                   Diabetes care coordinator                   Transition of care consulted                   Nutrition    consulted                                   Consults called:  Er provider to [facility]  Left msg to [facility]   Admission status:  ED Disposition     ED Disposition  Admit   Condition  --   Comment  The patient appears reasonably stabilized for admission considering the current resources, flow, and capabilities available in the ED at this time, and I doubt any other [facility] requiring further screening and/or treatment in the ED prior to admission is  present.           Obs***  ***  inpatient     I Expect 2 midnight stay secondary to severity of patient's current illness need for inpatient interventions justified by the following: ***hemodynamic instability despite optimal treatment (tachycardia *hypotension * tachypnea *hypoxia, hypercapnia) * Severe lab/radiological/exam abnormalities including:     and extensive comorbidities including: *substance abuse  *Chronic pain *DM2  * CHF * CAD  * COPD/asthma *Morbid Obesity * CKD *dementia *liver disease *history of stroke with residual deficits *  malignancy, * sickle cell disease  History of amputation Chronic anticoagulation  That are currently affecting medical management.   I expect  patient to be hospitalized for 2  midnights requiring inpatient medical care.  Patient is at high risk for adverse outcome (such as loss of life or disability) if not treated.  Indication for inpatient stay as follows:  Severe change from baseline regarding  mental status Hemodynamic instability despite maximal medical therapy,  ongoing suicidal ideations,  severe pain requiring acute inpatient management,  inability to maintain oral hydration   persistent chest pain despite medical management Need for operative/procedural  intervention New or worsening hypoxia   Need for IV antibiotics, IV fluids, IV rate controling medications, IV antihypertensives, IV pain medications, IV anticoagulation, need for biPAP    Level of care        progressive tele indefinitely please discontinue once patient no longer qualifies COVID-19 Labs    Lab Results  Component Value Date   SARSCOV2NAA NEGATIVE 08/29/2022     Precautions: admitted as *** Covid Negative  ***asymptomatic screening protocol****PUI *** covid positive No active isolations ***If Covid PCR is negative  - please DC precautions - would need additional investigation given very high risk for false native test result    Critical***  Patient is critically ill due to  hemodynamic instability * respiratory failure *severe sepsis* ongoing chest pain*  They are at high risk for life/limb threatening clinical deterioration requiring frequent reassessment and modifications of care.  Services provided include examination of the patient, review of relevant ancillary tests, prescription of lifesaving therapies, review of medications and prophylactic therapy.  Total critical care time excluding separately billable procedures: 60*  Minutes.    Zhaire Locker 12/30/2022, 9:05 PM ***  [facility]     after 2 AM please page floor coverage PA If 7AM-7PM, please contact the day team taking care of the patient using [email or web address]

## 2022-12-31 NOTE — Evaluation (Signed)
Clinical/Bedside Swallow Evaluation Patient Details  Name: Andres White MRN: [id number] Date of Birth: 11-13-1945  Today's Date: 12/31/2022 Time: SLP Start Time (ACUTE ONLY): 1115 SLP Stop Time (ACUTE ONLY): 1131 SLP Time Calculation (min) (ACUTE ONLY): 16 min  Past Medical History:  Past Medical History:  Diagnosis Date   Arthritis    Chronic back pain    Duodenal adenoma    Gout    Hypertension    Stroke (HCC) 06/2020   Syncope and collapse    Past Surgical History:  Past Surgical History:  Procedure Laterality Date   BIOPSY  07/13/2020   Procedure: BIOPSY;  Surgeon: Meryl Dare, MD;  Location: [facility] ENDOSCOPY;  Service: Endoscopy;;   BIOPSY  11/28/2020   Procedure: BIOPSY;  Surgeon: Lemar Lofty., MD;  Location: [facility] ENDOSCOPY;  Service: Gastroenterology;;   CARDIAC CATHETERIZATION  2015   [facility]    COLONOSCOPY WITH PROPOFOL N/A 07/13/2020   Procedure: COLONOSCOPY WITH PROPOFOL;  Surgeon: Meryl Dare, MD;  Location: [facility] ENDOSCOPY;  Service: Endoscopy;  Laterality: N/A;   COLONOSCOPY WITH PROPOFOL N/A 11/28/2020   Procedure: COLONOSCOPY WITH PROPOFOL;  Surgeon: Meridee Score Netty Starring., MD;  Location: [facility] ENDOSCOPY;  Service: Gastroenterology;  Laterality: N/A;   ENDOSCOPIC MUCOSAL RESECTION N/A 11/28/2020   Procedure: ENDOSCOPIC MUCOSAL RESECTION;  Surgeon: Meridee Score Netty Starring., MD;  Location: [facility] ENDOSCOPY;  Service: Gastroenterology;  Laterality: N/A;   ESOPHAGOGASTRODUODENOSCOPY N/A 07/13/2020   Procedure: ESOPHAGOGASTRODUODENOSCOPY (EGD);  Surgeon: Meryl Dare, MD;  Location: [facility] ENDOSCOPY;  Service: Endoscopy;  Laterality: N/A;   ESOPHAGOGASTRODUODENOSCOPY (EGD) WITH PROPOFOL N/A 11/28/2020   Procedure: ESOPHAGOGASTRODUODENOSCOPY (EGD) WITH PROPOFOL;  Surgeon: Meridee Score Netty Starring., MD;  Location: [facility] ENDOSCOPY;  Service: Gastroenterology;  Laterality: N/A;   HEMOSTASIS CLIP PLACEMENT  11/28/2020   Procedure: HEMOSTASIS CLIP PLACEMENT;  Surgeon: Lemar Lofty., MD;  Location: [facility] ENDOSCOPY;  Service: Gastroenterology;;   KNEE SURGERY Right    POLYPECTOMY  07/13/2020   Procedure: POLYPECTOMY;  Surgeon: Meryl Dare, MD;  Location: [facility] ENDOSCOPY;  Service: Endoscopy;;   POLYPECTOMY  11/28/2020   Procedure: POLYPECTOMY;  Surgeon: Lemar Lofty., MD;  Location: [facility] ENDOSCOPY;  Service: Gastroenterology;;   SUBMUCOSAL LIFTING INJECTION  11/28/2020   Procedure: SUBMUCOSAL LIFTING INJECTION;  Surgeon: Lemar Lofty., MD;  Location: [facility] ENDOSCOPY;  Service: Gastroenterology;;   SUBMUCOSAL TATTOO INJECTION  11/28/2020   Procedure: SUBMUCOSAL TATTOO INJECTION;  Surgeon: Lemar Lofty., MD;  Location: [facility] ENDOSCOPY;  Service: Gastroenterology;;   HPI:  Patient is a 77 year old male with history of dementia, BPH stroke with right-sided weakness, seizure disorder, hypertension, peripheral artery disease, diastolic CHF, carotid stenosis, bradycardia who presented with confusion .  As per the report, patient was lethargic for 4 days.  On presentation he was bradycardic, hypotensive.  He was recently treated for UTI with Levaquin. Dx with acute encephalopathy, multifactorial from CVA (MRI showed acute to subacute lavcunar infarct in teh right lower brainstem, pontomedullary junction and right medullary pyramid) and UTI, anemia, hypokalemia, and Covid positive. Patient seen in 2022 s/p CVA by SLP and initially placed on nectar thick liquids however upgrade to dysphagia 3 and thin prior to d/c.    Assessment / Plan / Recommendation  Clinical Impression  Patient presents with what appears to be normal oropharyngeal swallowing function. No overt indication of aspiration observed. No SLP f/u indicated at this time. SLP Visit Diagnosis: Dysphagia, unspecified (R13.10)       Diet Recommendation Regular;Thin liquid   Liquid Administration  via: Cup;Straw Medication Administration: Whole meds with liquid Supervision: Staff to assist with  self feeding Compensations: Slow rate;Small sips/bites Postural Changes: Seated upright at 90 degrees    Other  Recommendations Oral Care Recommendations: Oral care BID    Recommendations for follow up therapy are one component of a multi-disciplinary discharge planning process, led by the attending physician.  Recommendations may be updated based on patient status, additional functional criteria and insurance authorization.  Follow up Recommendations No SLP follow up        Swallow Study   General HPI: Patient is a 77 year old male with history of dementia, BPH stroke with right-sided weakness, seizure disorder, hypertension, peripheral artery disease, diastolic CHF, carotid stenosis, bradycardia who presented with confusion .  As per the report, patient was lethargic for 4 days.  On presentation he was bradycardic, hypotensive.  He was recently treated for UTI with Levaquin. Dx with acute encephalopathy, multifactorial from CVA (MRI showed acute to subacute lavcunar infarct in teh right lower brainstem, pontomedullary junction and right medullary pyramid) and UTI, anemia, hypokalemia, and Covid positive. Patient seen in 2022 s/p CVA by SLP and initially placed on nectar thick liquids however upgrade to dysphagia 3 and thin prior to d/c. Type of Study: Bedside Swallow Evaluation Previous Swallow Assessment: see HPI Diet Prior to this Study: NPO Temperature Spikes Noted: No Respiratory Status: Room air History of Recent Intubation: No Behavior/Cognition: Alert;Cooperative;Pleasant mood Oral Cavity Assessment: Within Functional Limits Oral Care Completed by SLP: Recent completion by staff Oral Cavity - Dentition: Dentures, not available (patient does not wish to wear) Vision: Functional for self-feeding Self-Feeding Abilities: Able to feed self;Needs assist Patient Positioning: Upright in bed Baseline Vocal Quality: Normal Volitional Cough: Strong Volitional Swallow: Able to elicit     Oral/Motor/Sensory Function Overall Oral Motor/Sensory Function: Within functional limits   Ice Chips Ice chips: Not tested   Thin Liquid Thin Liquid: Within functional limits Presentation: Straw;Self Fed    Nectar Thick Nectar Thick Liquid: Not tested   Honey Thick Honey Thick Liquid: Not tested   Puree Puree: Within functional limits Presentation: Spoon   Solid     Solid: Within functional limits Presentation: Self Fed     Malikhi Ogan MA, CCC-SLP  Rishith Siddoway Meryl 12/31/2022,11:50 AM

## 2022-12-31 NOTE — Consult Note (Signed)
Neurology Consultation  Reason for Consult: Stroke on MRI brain  Referring Physician: Dr. Renford Dills   CC: hypotension   History is obtained from:daughter at bedside and medical record   HPI: Andres White is a 77 y.o. male with past medical history of prior CVA with residual right side weakness on ASA and Plavix, dementia, seizures on Keppra, HTN, HLD, chronic diastolic HF, CAD, PAD, CKD, BPH and gout who presented to the ED for evaluation of 4 days of lethargy, hypotension and concerns for seizure like activity. Per daughter at the bedside he was noted to be non responsive with a left gaze, no twitching or jerking noted lasting ~ 20 seconds and then returned to baseline.  MRI brain resulted with Acute to subacute lacunar infarct of the Right lower brainstem - Pontomedullary junction and right medullary pyramid. Neurology was curbside last pm and patient was given 1gm Keppra IV and home dose Keprra increased to 750mg  BID.  Routine EEG being done now  Neurology consulted for stroke workup   LKW: 4 days prior to admission  IV thrombolysis given?: no, outside window  EVT:  No LVO  Premorbid modified Rankin scale (mRS):  3-Moderate disability-requires help but walks WITHOUT assistance  ROS: Unable to obtain due to altered mental status.   Past Medical History:  Diagnosis Date   Arthritis    Chronic back pain    Duodenal adenoma    Gout    Hypertension    Stroke [facility]) 06/2020   Syncope and collapse      Family History  Problem Relation Age of Onset   Hypertension Mother    Colon cancer Neg Hx    Pancreatic cancer Neg Hx    Stomach cancer Neg Hx    Esophageal cancer Neg Hx    Inflammatory bowel disease Neg Hx    Liver disease Neg Hx    Rectal cancer Neg Hx      Social History:   reports that he has been smoking cigarettes. He has a 10.00 pack-year smoking history. He has been exposed to tobacco smoke. He has never used smokeless tobacco. He reports that he does not  currently use alcohol. He reports that he does not use drugs.  Medications  Current Facility-Administered Medications:    0.9 %  sodium chloride infusion (Manually program via Guardrails IV Fluids), , Intravenous, Once, Doutova, Anastassia, MD   acetaminophen (TYLENOL) tablet 650 mg, 650 mg, Oral, Q6H PRN **OR** acetaminophen (TYLENOL) suppository 650 mg, 650 mg, Rectal, Q6H PRN, Doutova, Anastassia, MD   albuterol (PROVENTIL) (2.5 MG/3ML) 0.083% nebulizer solution 2.5 mg, 2.5 mg, Nebulization, Q2H PRN, Doutova, Anastassia, MD   atorvastatin (LIPITOR) tablet 40 mg, 40 mg, Oral, Daily, Doutova, Anastassia, MD   cefTRIAXone (ROCEPHIN) 1 g in sodium chloride 0.9 % 100 mL IVPB, 1 g, Intravenous, QHS, Doutova, Anastassia, MD, Stopped at 12/31/22 0429   Chlorhexidine Gluconate Cloth 2 % PADS 6 each, 6 each, Topical, Q0600, Adhikari, Amrit, MD   citalopram (CELEXA) tablet 10 mg, 10 mg, Oral, Daily, Doutova, Anastassia, MD   dextrose 5 %-0.9 % sodium chloride infusion, , Intravenous, Continuous, Adhikari, Amrit, MD   HYDROcodone-acetaminophen (NORCO/VICODIN) 5-325 MG per tablet 1-2 tablet, 1-2 tablet, Oral, Q4H PRN, Doutova, Anastassia, MD   levETIRAcetam (KEPPRA) 750 mg in sodium chloride 0.9 % 100 mL IVPB, 750 mg, Intravenous, Q12H, Adhikari, Amrit, MD, Last Rate: 430 mL/hr at 12/31/22 1101, 750 mg at 12/31/22 1101   ondansetron (ZOFRAN) tablet 4 mg, 4 mg, Oral, Q6H  PRN **OR** ondansetron (ZOFRAN) injection 4 mg, 4 mg, Intravenous, Q6H PRN, Doutova, Anastassia, MD   pantoprazole (PROTONIX) EC tablet 40 mg, 40 mg, Oral, BID, Doutova, Anastassia, MD   QUEtiapine (SEROQUEL) tablet 50 mg, 50 mg, Oral, QHS, Doutova, Anastassia, MD   tamsulosin (FLOMAX) capsule 0.4 mg, 0.4 mg, Oral, QPC supper, Doutova, Anastassia, MD   traMADol (ULTRAM) tablet 50 mg, 50 mg, Oral, BID PRN, Therisa Doyne, MD   Exam: Current vital signs: BP (!) 122/52 (BP Location: Right Arm)   Pulse (!) 56   Temp 99.1 F (37.3 C)  (Axillary)   Resp 18   Ht 5\' 8"  (1.727 m)   Wt 74.8 kg   SpO2 99%   BMI 25.07 kg/m  Vital signs in last 24 hours: Temp:  [96.5 F (35.8 C)-99.1 F (37.3 C)] 99.1 F (37.3 C) (05/29 0854) Pulse Rate:  [53-77] 56 (05/29 0854) Resp:  [11-22] 18 (05/29 0854) BP: (96-143)/(52-86) 122/52 (05/29 0854) SpO2:  [88 %-100 %] 99 % (05/29 0359) Weight:  [74.8 kg-76.4 kg] 74.8 kg (05/29 0500)  GENERAL: Awake, alert in NAD HEENT: - Normocephalic and atraumatic, dry mm LUNGS - Clear to auscultation bilaterally with no wheezes CV - S1S2 RRR, no m/r/g, equal pulses bilaterally. ABDOMEN - Soft, nontender, nondistended with normoactive BS Ext: warm, well perfused, intact peripheral pulses, mild edema in bilateral feet   NEURO:  Mental Status: alert and oriented to self and hospital. Stated "July" for month, incorrect age and birth day and age.  Language: speech is clear.  Naming, repetition, fluency intact Cranial Nerves: PERRL EOMI, visual fields full, no facial asymmetry, facial sensation intact, hearing intact, tongue/uvula/soft palate midline, normal sternocleidomastoid and trapezius muscle strength. No evidence of tongue atrophy or fibrillations Motor: 4/5 left upper, right upper and bilateral lowers 3/5 Tone: is normal and bulk is normal Sensation- Intact to light touch bilaterally Coordination: FTN intact bilaterally, no ataxia in BLE. Gait- deferred  NIHSS 1a Level of Conscious.: 1 1b LOC Questions: 2 1c LOC Commands: 0 2 Best Gaze: 0 3 Visual: 0 4 Facial Palsy: 0 5a Motor Arm - left: 0 5b Motor Arm - Right: 1 6a Motor Leg - Left: 1 6b Motor Leg - Right: 1 7 Limb Ataxia: 0 8 Sensory: 0 9 Best Language: 0 10 Dysarthria: 0 11 Extinct. and Inatten.: 0 TOTAL: 6   Labs I have reviewed labs in epic and the results pertinent to this consultation are:  CBC    Component Value Date/Time   WBC 5.3 12/30/2022 1846   RBC 2.66 (L) 12/30/2022 1846   HGB 5.5 (LL) 12/30/2022 1846    HGB 9.7 (L) 08/26/2022 1407   HCT 19.8 (L) 12/30/2022 1846   HCT 31.4 (L) 08/26/2022 1407   PLT 311 12/30/2022 1846   PLT 326 08/26/2022 1407   MCV 74.4 (L) 12/30/2022 1846   MCV 82 08/26/2022 1407   MCV 95 03/23/2014 2009   MCH 20.7 (L) 12/30/2022 1846   MCHC 27.8 (L) 12/30/2022 1846   RDW 19.9 (H) 12/30/2022 1846   RDW 16.1 (H) 08/26/2022 1407   RDW 14.1 03/23/2014 2009   LYMPHSABS 1.9 12/30/2022 1846   LYMPHSABS 2.9 03/23/2014 2009   MONOABS 0.3 12/30/2022 1846   MONOABS 0.5 03/23/2014 2009   EOSABS 0.0 12/30/2022 1846   EOSABS 0.1 03/23/2014 2009   BASOSABS 0.0 12/30/2022 1846   BASOSABS 0.0 03/23/2014 2009    CMP     Component Value Date/Time   NA 135 12/30/2022  1846   NA 144 08/26/2022 1407   NA 134 (L) 03/23/2014 2009   K 2.2 (LL) 12/30/2022 1846   K 3.5 03/23/2014 2009   CL 104 12/30/2022 1846   CL 106 03/23/2014 2009   CO2 22 12/30/2022 1846   CO2 17 (L) 03/23/2014 2009   GLUCOSE 112 (H) 12/30/2022 1846   GLUCOSE 116 (H) 03/23/2014 2009   BUN 11 12/30/2022 1846   BUN 57 (H) 08/26/2022 1407   BUN 36 (H) 03/23/2014 2009   CREATININE 1.68 (H) 12/30/2022 1846   CREATININE 1.88 (H) 03/23/2014 2009   CALCIUM 7.8 (L) 12/30/2022 1846   CALCIUM 8.9 03/23/2014 2009   PROT 6.6 12/30/2022 1846   PROT 7.8 08/26/2022 1407   PROT 8.2 01/06/2014 1157   ALBUMIN 2.1 (L) 12/30/2022 1846   ALBUMIN 3.4 (L) 08/26/2022 1407   ALBUMIN 3.5 01/06/2014 1157   AST 35 12/30/2022 1846   AST 51 (H) 01/06/2014 1157   ALT 17 12/30/2022 1846   ALT 43 01/06/2014 1157   ALKPHOS 68 12/30/2022 1846   ALKPHOS 76 01/06/2014 1157   BILITOT 0.5 12/30/2022 1846   BILITOT 0.5 08/26/2022 1407   BILITOT 0.4 01/06/2014 1157   GFRNONAA 41 (L) 12/30/2022 1846   GFRNONAA 35 (L) 03/23/2014 2009   GFRAA 32 (L) 11/18/2017 1830   GFRAA 41 (L) 03/23/2014 2009    Lipid Panel     Component Value Date/Time   CHOL 104 12/10/2021 1123   TRIG 93.0 12/10/2021 1123   HDL 50.50 12/10/2021 1123    CHOLHDL 2 12/10/2021 1123   VLDL 18.6 12/10/2021 1123   LDLCALC 35 12/10/2021 1123   LDL 35 A1c ordered   Imaging I have reviewed the images obtained:  CT-head- no acute process, advanced chronic ischemic disease   MRI examination of the brain-Acute to subacute lacunar infarct of the Right lower brainstem - Pontomedullary junction and right medullary pyramid. advanced chronic ischemic disease   Assessment:   77 y.o. male with past medical history of prior CVA with residual right side weakness, dementia, seizures on Keppra, HTN, HLD, chronic diastolic HF, CAD, PAD, CKD, BPH and gout who presented to the ED for evaluation of 4 days of lethargy, hypotension and concerns for seizure like activity.  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 If passes may resume prior diet  - Hold antiplatelets in the setting of acute anemia. Resume when appropriate  - Risk factor modification - Telemetry monitoring - PT consult, OT consult, Speech consult - Stroke team to follow - routine  EEG pending  - continue Keppra 750mg  BID  - continue to treat infections per primary  - seizure and delirium precautions   Gevena Mart DNP, ACNPC-AG  [facility]

## 2022-12-31 NOTE — Procedures (Signed)
Patient Name: Andres White  MRN: [id number]  Epilepsy Attending: Charlsie Quest  Referring Physician/Provider: Caryl Pina, MD  Date: 12/31/2022 Duration: 23.45 mins  Patient history:  77 year old male with history of dementia, BPH stroke with right-sided weakness, seizure disorder, hypertension, peripheral artery disease, diastolic CHF, carotid stenosis, bradycardia who presented with confusion. EEG to evaluate for seizure.  Level of alertness: Awake  AEDs during EEG study: LEV  Technical aspects: This EEG study was done with scalp electrodes positioned according to the 10-20 International system of electrode placement. Electrical activity was reviewed with band pass filter of 1-70Hz , sensitivity of 7 uV/mm, display speed of 22mm/sec with a 60Hz  notched filter applied as appropriate. EEG data were recorded continuously and digitally stored.  Video monitoring was available and reviewed as appropriate.  Description: The posterior dominant rhythm consists of 7 Hz activity of moderate voltage (25-35 uV) seen predominantly in posterior head regions, symmetric and reactive to eye opening and eye closing. EEG showed continuous generalized 5 to 6 Hz theta slowing. Hyperventilation and photic stimulation were not performed.     ABNORMALITY - Continuous slow, generalized - Background slow  IMPRESSION: This study is suggestive of mild to moderate diffuse encephalopathy, nonspecific etiology. No seizures or epileptiform discharges were seen throughout the recording.  Fabien Travelstead Annabelle Harman

## 2022-12-31 NOTE — Assessment & Plan Note (Signed)
Main symptoms such as generalized and more localized weakness.  Which could have been attributed to a number of other things such as anemia and possible stroke/recent seizure.  As well as UTI For right now we will continue supportive treatment will hold off on remdesivir given patient is already bradycardic with ectopy

## 2022-12-31 NOTE — Assessment & Plan Note (Signed)
Will replace and recheck continue to follow

## 2022-12-31 NOTE — Progress Notes (Signed)
   12/30/22 2345  Vitals  Temp (!) 97.5 F (36.4 C)  Temp Source Oral  BP (!) 143/75  Pulse Rate 62  ECG Heart Rate (!) 58  Resp (!) 22  MEWS COLOR  MEWS Score Color Green  MEWS Score  MEWS Temp 0  MEWS Systolic 0  MEWS Pulse 0  MEWS RR 1  MEWS LOC 0  MEWS Score 1   Pt arrived from ED accompanied by daughter and granddaughter. PRBC infusing and K+replacement infusing at time of arrival. Indwelling foley cath to d/d. Pt a&ox2. Self and situation. Overnight plan of care discussed with daughter. Airborne isolation & seizure precautions. Bed alarm set.

## 2022-12-31 NOTE — Assessment & Plan Note (Signed)
-   will monitor on tele avoid QT prolonging medications, rehydrate correct electrolytes  

## 2022-12-31 NOTE — Progress Notes (Signed)
PT Cancellation Note  Patient Details Name: Andres White MRN: [id number] DOB: Dec 14, 1945   Cancelled Treatment:    Reason Eval/Treat Not Completed: Patient at procedure or test/unavailable (Pt off the floor at MRI. Will follow up if time allows.)   [name] 12/31/2022, 9:28 AM

## 2022-12-31 NOTE — Progress Notes (Signed)
PT Cancellation Note  Patient Details Name: Andres White MRN: [id number] DOB: [birth year]   Cancelled Treatment:    Reason Eval/Treat Not Completed: Medical issues which prohibited therapy (Spoke with RN who reported that pt is not medically appropriate for PT at this time. Will try again tomorrow if medically appropriate.)   [name] 12/31/2022, 1:49 PM

## 2022-12-31 NOTE — Assessment & Plan Note (Signed)
Permissive hypertension for tonight 

## 2022-12-31 NOTE — Consult Note (Signed)
   [facility] Inpatient Consult   12/31/2022  Andres White June 10, 1946 [id number]  [facility] [[facility]]  Accountable Care Organization [ACO] Patient: [name]  Primary Care Provider:  Hildred Priest, FNP, is a provider for [facility], this provider now provides care coordination for the patient  *Remote review patient on airborne precaution to preserve   Patient was screened for potential [facility]  [[facility]] Care Management service needs for post hospital transition for care coordination.  Review of patient's electronic medical record reveals patient/family has recently transitioned his care with [facility] per [facility] Care Coordination encounters and care coordination is now with [facility].   Plan:  Continue to follow progress and disposition to assess for post hospital community care coordination/management needs.  Referral request for community care coordination: ongoing needs anticipated with [facility] can reassess if needs changes.  Of note, [facility] Care Management/Population Health does not replace or interfere with any arrangements made by the Inpatient Transition of Care team.  For questions contact:   Charlesetta Shanks, RN BSN CCM [facility]  [phone] business mobile phone Toll free office [phone]  *Concierge Line  [phone] Fax number: [phone] [name]

## 2022-12-31 NOTE — Assessment & Plan Note (Signed)
Appreciate cardio allergy consult.  TSH within normal limits repeat echogram

## 2022-12-31 NOTE — Progress Notes (Addendum)
PROGRESS NOTE  Andres White  ZOX:[id number] DOB: March 10, 1946 DOA: 12/30/2022 PCP: Hildred Priest, FNP   Brief Narrative: Patient is a 77 year old male with history of dementia, BPH stroke with right-sided weakness, seizure disorder, hypertension, peripheral artery disease, diastolic CHF, carotid stenosis, bradycardia who presented with confusion .  As per the report, patient was lethargic for 4 days.  On presentation he was bradycardic, hypotensive.  He was recently treated for UTI with Levaquin.  Lab work showed potassium of 2.2, hemoglobin in the range of 5.  GI, cardiology consulted. Assessment & Plan:  Principal Problem:   Acute encephalopathy Active Problems:   Left carotid stenosis   Stroke-like symptoms   Acute blood loss anemia   Acute renal failure superimposed on stage 3b chronic kidney disease (HCC)   Hypertension   CKD (chronic kidney disease) stage 3, GFR 30-59 ml/min (HCC)   History of seizure disorder   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   Hypokalemia   Acute metabolic encephalopathy   UTI (urinary tract infection)   Hypomagnesemia   Frequent PVCs   Prolonged QT interval   Acute urinary retention   COVID-19 virus infection   Sinus bradycardia   Right foot ulcer (HCC)    Acute encephalopathy: History of known dementia.  More confused from baseline.  Lethargic for 4 days.  Multifactorial likely from stroke, UTI.  Mentation seems to be better this morning.  He is oriented to place.  Obeys commands  Acute/Subacute stroke: MRI showed  acute to subacute lacunar infarct of the Right lower brainstem :Pontomedullary junction and right medullary pyramid.  Neurology consulted.  Has history of stroke.  Ambulates with walker/wheelchair.  PT/OT/speech evaluation.  Initiated stroke workup.Not a candidate for antiplatelet or anticoagulation at the moment due to possible GI bleed   Suspected UTI: UA suspicious for UTI, started on ceftriaxone.  Follow-up urine culture.  Not sure  if patient had dysuria.  No leukocytosis or fever  H/O seizure.  Seizure could not be ruled out.   Currently on IV Keppra.  Neurology  consulted.  Severe anemia/GI bleed: Hemoglobin in the range of 5 on presentation.  Transfused with 2 units of PRBC.  FOBT positive.  GI consulted .  Monitor H&H.[name]  Patient has history of chronic gastritis.No hematochezia or melena as per the daughter  Severe hypokalemia/hypomagnesemia: Currently being supplemented and  monitored.  AKI on CKD stage IIIb: Foley catheter placement at Emergency Department for urinary retention.  Presented with creatinine of 1.6, BMP pending  Frequent PVCs/bradycardia/prolonged QTc: Cardiology consulted.  Has chronic sinus bradycardia.  Cardiology signed off Patient has prolonged QT.  Monitor on telemetry.  TSH normal  Covid: This could also  have attributed to weakness.  Currently on room air.  Continue supportive care.  Not a started on steroids or antivirals.  Right foot ulcer: appears to be noninfected.  Wound care consulted and following   Addendum: Platelet count this morning found to be 23,000.  Yesterday it was more than 300,000.  Will check immature platelet fraction, repeat platelet count       DVT prophylaxis:SCDs Start: 12/30/22 2309     Code Status: Full Code  Family Communication: Called and discussed with daughter on phone on 5/29  Patient status:Inpatient  Patient is from :Home  Anticipated discharge WJ:XBJY vs SnF  Estimated DC date:   Consultants: Cardiology,GI, neurology  Procedures:None  Antimicrobials:  Anti-infectives (From admission, onward)    Start     Dose/Rate Route Frequency Ordered Stop  12/30/22 2345  cefTRIAXone (ROCEPHIN) 1 g in sodium chloride 0.9 % 100 mL IVPB        1 g 200 mL/hr over 30 Minutes Intravenous Daily at bedtime 12/30/22 2317         Subjective: Patient seen and examined at bedside today.  Hemodynamically stable.  On room air.  Looks comfortable.  Alert  and awake.  Knows that he is in the hospital.  Could not tell the correct month.  He follows all commands pretty well.  Denies any nausea, vomiting, shortness of breath, cough or abdominal pain  Objective: Vitals:   12/31/22 0248 12/31/22 0359 12/31/22 0500 12/31/22 0854  BP: 125/62 114/63  (!) 122/52  Pulse: 77 (!) 56    Resp: (!) 21 17    Temp: 98.5 F (36.9 C) 98.5 F (36.9 C)  99.1 F (37.3 C)  TempSrc: Oral Oral  Axillary  SpO2: 100% 99%    Weight:   74.8 kg   Height:        Intake/Output Summary (Last 24 hours) at 12/31/2022 0913 Last data filed at 12/31/2022 0450 Gross per 24 hour  Intake 1856.9 ml  Output 1050 ml  Net 806.9 ml   Filed Weights   12/30/22 2215 12/31/22 0500  Weight: 76.4 kg 74.8 kg    Examination:  General exam: Overall comfortable, not in distress, appears weak and deconditioned HEENT: PERRL Respiratory system:  no wheezes or crackles  Cardiovascular system: S1 & S2 heard, RRR.  Gastrointestinal system: Abdomen is nondistended, soft and nontender. Central nervous system: Alert and awake , oriented to place only Extremities: trace lower extremity edema, no clubbing ,no cyanosis Skin: No rashes, no icterus  , right heel ulcer   Data Reviewed: I have personally reviewed following labs and imaging studies  CBC: Recent Labs  Lab 12/30/22 1846  WBC 5.3  NEUTROABS 3.0  HGB 5.5*  HCT 19.8*  MCV 74.4*  PLT 311   Basic Metabolic Panel: Recent Labs  Lab 12/30/22 1846  NA 135  K 2.2*  CL 104  CO2 22  GLUCOSE 112*  BUN 11  CREATININE 1.68*  CALCIUM 7.8*  MG 1.4*     Recent Results (from the past 240 hour(s))  Culture, blood (Routine x 2)     Status: None (Preliminary result)   Collection Time: 12/30/22  6:46 PM   Specimen: BLOOD  Result Value Ref Range Status   Specimen Description BLOOD RIGHT ANTECUBITAL  Final   Special Requests   Final    BOTTLES DRAWN AEROBIC AND ANAEROBIC Blood Culture results may not be optimal due to an  inadequate volume of blood received in culture bottles   Culture   Final    NO GROWTH < 12 HOURS Performed at [facility], [address], [place], [place] [address]    Report Status PENDING  Incomplete  SARS Coronavirus 2 by RT PCR (hospital order, performed in [facility] hospital lab) *cepheid single result test* Anterior Nasal Swab     Status: Abnormal   Collection Time: 12/30/22  9:17 PM   Specimen: Anterior Nasal Swab  Result Value Ref Range Status   SARS Coronavirus 2 by RT PCR POSITIVE (A) NEGATIVE Final    Comment: Performed at [facility], [address], [place], [place] [address]     Radiology Studies: CT HEAD WO CONTRAST ( )  Result Date: 12/31/2022 CLINICAL DATA:  77 year old male with delirium. EXAM: CT HEAD WITHOUT CONTRAST TECHNIQUE: Contiguous axial  images were obtained from the base of the skull through the vertex without intravenous contrast. RADIATION DOSE REDUCTION: This exam was performed according to the departmental dose-optimization program which includes automated exposure control, adjustment of the mA and/or kV according to patient size and/or use of iterative reconstruction technique. COMPARISON:  Brain MRI 09/20/2020.  Head CT 08/29/2022. FINDINGS: Brain: Cavum septum pellucidum, normal variant. No ventriculomegaly. No midline shift, mass effect, or evidence of intracranial mass lesion. No acute intracranial hemorrhage identified. Chronic encephalomalacia in the posterior right MCA territory with scattered small chronic infarcts in the bilateral thalami, bilateral cerebellum. Patchy and confluent bilateral cerebral white matter hypodensity. Stable gray-white matter differentiation throughout the brain. No cortically based acute infarct identified. Vascular: No suspicious intracranial vascular hyperdensity. Skull: Chronic lamina papyracea and left orbital floor fractures. No acute osseous abnormality identified. Sinuses/Orbits: Visualized paranasal  sinuses and mastoids are stable and well aerated. Other: Chronic left orbital floor fracture. No acute orbit or scalp soft tissue finding identified. IMPRESSION: 1. No acute intracranial abnormality identified. Stable non contrast CT appearance of advanced chronic ischemic disease in the brain. 2. Chronic lamina papyracea and left orbital floor fractures. Electronically Signed   By: Odessa Fleming M.D.   On: 12/31/2022 05:10   DG Chest Portable 1 View  Result Date: 12/30/2022 CLINICAL DATA:  Lethargic Hypotension, lethargy and weakness x3 days. Seizure today. Hx of stroke, HTN. EXAM: PORTABLE CHEST 1 VIEW.  Patient is rotated. COMPARISON:  Chest x-ray 08/29/2022 FINDINGS: The heart and mediastinal contours are within normal limits. Aortic calcification. Developed retrocardiac airspace opacity. No pulmonary edema. No pleural effusion. No pneumothorax. No acute osseous abnormality. IMPRESSION: 1. Developing retrocardiac airspace opacity with limited evaluation due to patient rotation. Recommend repeat PA and lateral view of the chest. 2.  Aortic Atherosclerosis (ICD10-I70.0). Electronically Signed   By: Tish Frederickson M.D.   On: 12/30/2022 19:50    Scheduled Meds:  sodium chloride   Intravenous Once   allopurinol  100 mg Oral Daily   atorvastatin  40 mg Oral Daily   Chlorhexidine Gluconate Cloth  6 each Topical Q0600   citalopram  10 mg Oral Daily   pantoprazole  40 mg Oral BID   QUEtiapine  50 mg Oral QHS   tamsulosin  0.4 mg Oral QPC supper   Continuous Infusions:  sodium chloride 75 mL/hr at 12/31/22 0257   cefTRIAXone (ROCEPHIN)  IV Stopped (12/31/22 0429)   levETIRAcetam       LOS: 0 days   Burnadette Pop, MD [facility] P5/29/2024, 9:13 AM

## 2022-12-31 NOTE — Progress Notes (Signed)
Left lower extremity venous duplex has been completed. Preliminary results can be found in CV Proc through chart review.   12/31/22 1:14 PM Olen Cordial RVT

## 2022-12-31 NOTE — Progress Notes (Signed)
EEG complete - results pending 

## 2022-12-31 NOTE — Assessment & Plan Note (Signed)
May have had a new seizure today.  Discussed with neurology for now will load with Keppra 1000 mg IV tonight increase the dose of Keppra to 750 twice daily Order seizure precautions continue to monitor will need to have follow-up with neurology after discharge

## 2022-12-31 NOTE — Progress Notes (Signed)
Arrhythmia subsided.  Only has rare PVCs.  Back to his usual baseline sinus bradycardia. Repeat labs pending. Will noncardiac medical problems remained to be resolved including acute kidney injury, severe electrolyte disturbances, severe anemia with microcytic indices. Will keep an eye out for the results of his echocardiogram, but doubt there has been any structural cardiac change.  Arrhythmia can be readily attributed to metabolic abnormalities.  [facility] will sign off.   Medication Recommendations: Do not administer hydrochlorothiazide and furosemide until lab abnormalities are corrected.  Hold lisinopril until renal function returns to baseline.  Okay to give amlodipine if his blood pressure increases. Other recommendations (labs, testing, etc): Per primary team Follow up as an outpatient: Follow-up visit in our office in September.  Thurmon Fair, MD, [facility] [phone] office [phone] pager

## 2022-12-31 NOTE — Progress Notes (Signed)
OT Cancellation Note  Patient Details Name: Andres White MRN: [id number] DOB: Oct 15, 1945   Cancelled Treatment:    Reason Eval/Treat Not Completed: Patient at procedure or test/ unavailable (Pt in multiple tests upon attempts this morning, will continue efforts.)  Evern Bio 12/31/2022, 11:54 AM Berna Spare, OTR/L Acute Rehabilitation Services Office: [phone]

## 2023-01-01 ENCOUNTER — Inpatient Hospital Stay (HOSPITAL_COMMUNITY): Payer: Medicare Other

## 2023-01-01 DIAGNOSIS — U071 COVID-19: Secondary | ICD-10-CM | POA: Diagnosis not present

## 2023-01-01 DIAGNOSIS — I499 Cardiac arrhythmia, unspecified: Secondary | ICD-10-CM | POA: Diagnosis not present

## 2023-01-01 DIAGNOSIS — G9340 Encephalopathy, unspecified: Secondary | ICD-10-CM | POA: Diagnosis not present

## 2023-01-01 DIAGNOSIS — G9341 Metabolic encephalopathy: Secondary | ICD-10-CM | POA: Diagnosis not present

## 2023-01-01 DIAGNOSIS — Z7189 Other specified counseling: Secondary | ICD-10-CM | POA: Diagnosis not present

## 2023-01-01 DIAGNOSIS — N179 Acute kidney failure, unspecified: Secondary | ICD-10-CM | POA: Diagnosis not present

## 2023-01-01 DIAGNOSIS — I6302 Cerebral infarction due to thrombosis of basilar artery: Secondary | ICD-10-CM | POA: Diagnosis not present

## 2023-01-01 DIAGNOSIS — D509 Iron deficiency anemia, unspecified: Secondary | ICD-10-CM | POA: Diagnosis not present

## 2023-01-01 DIAGNOSIS — I639 Cerebral infarction, unspecified: Secondary | ICD-10-CM | POA: Diagnosis not present

## 2023-01-01 DIAGNOSIS — Z515 Encounter for palliative care: Secondary | ICD-10-CM | POA: Diagnosis not present

## 2023-01-01 DIAGNOSIS — D649 Anemia, unspecified: Secondary | ICD-10-CM

## 2023-01-01 LAB — CBC
HCT: 25.9 % — ABNORMAL LOW (ref 39.0–52.0)
Hemoglobin: 8.2 g/dL — ABNORMAL LOW (ref 13.0–17.0)
MCH: 24.3 pg — ABNORMAL LOW (ref 26.0–34.0)
MCHC: 31.7 g/dL (ref 30.0–36.0)
MCV: 76.9 fL — ABNORMAL LOW (ref 80.0–100.0)
Platelets: 266 10*3/uL (ref 150–400)
RBC: 3.37 MIL/uL — ABNORMAL LOW (ref 4.22–5.81)
RDW: 20.2 % — ABNORMAL HIGH (ref 11.5–15.5)
WBC: 6.3 10*3/uL (ref 4.0–10.5)
nRBC: 0 % (ref 0.0–0.2)

## 2023-01-01 LAB — LIPID PANEL
Cholesterol: 75 mg/dL (ref 0–200)
HDL: 27 mg/dL — ABNORMAL LOW (ref >40)
LDL Cholesterol: 38 mg/dL (ref 0–99)
Total CHOL/HDL Ratio: 2.8 RATIO
Triglycerides: 49 mg/dL (ref <150)
VLDL: 10 mg/dL (ref 0–40)

## 2023-01-01 LAB — BASIC METABOLIC PANEL
Anion gap: 7 (ref 5–15)
BUN: 8 mg/dL (ref 8–23)
CO2: 19 mmol/L — ABNORMAL LOW (ref 22–32)
Calcium: 7.5 mg/dL — ABNORMAL LOW (ref 8.9–10.3)
Chloride: 110 mmol/L (ref 98–111)
Creatinine, Ser: 1.16 mg/dL (ref 0.61–1.24)
GFR, Estimated: >60 mL/min (ref >60)
Glucose, Bld: 100 mg/dL — ABNORMAL HIGH (ref 70–99)
Potassium: 2.7 mmol/L — CL (ref 3.5–5.1)
Sodium: 136 mmol/L (ref 135–145)

## 2023-01-01 LAB — HEMOGLOBIN A1C
Hgb A1c MFr Bld: 5.4 % (ref 4.8–5.6)
Mean Plasma Glucose: 108 mg/dL

## 2023-01-01 LAB — PHOSPHORUS: Phosphorus: 2.8 mg/dL (ref 2.5–4.6)

## 2023-01-01 LAB — MAGNESIUM: Magnesium: 1.8 mg/dL (ref 1.7–2.4)

## 2023-01-01 MED ORDER — LEVETIRACETAM 500 MG PO TABS
500.0000 mg | ORAL_TABLET | Freq: Two times a day (BID) | ORAL | Status: DC
  Administered 2023-01-01 – 2023-01-05 (×8): 500 mg via ORAL
  Filled 2023-01-01 (×8): qty 1

## 2023-01-01 MED ORDER — POTASSIUM CHLORIDE 20 MEQ PO PACK
40.0000 meq | PACK | ORAL | Status: AC
  Administered 2023-01-01 (×2): 40 meq via ORAL
  Filled 2023-01-01 (×2): qty 2

## 2023-01-01 MED ORDER — SODIUM CHLORIDE 0.9 % IV SOLN
250.0000 mg | Freq: Every day | INTRAVENOUS | Status: AC
  Administered 2023-01-01 – 2023-01-04 (×4): 250 mg via INTRAVENOUS
  Filled 2023-01-01: qty 250
  Filled 2023-01-01 (×3): qty 20

## 2023-01-01 MED ORDER — POTASSIUM CHLORIDE 20 MEQ PO PACK: 40.0000 meq | PACK | Freq: Once | ORAL | Status: DC

## 2023-01-01 MED ORDER — IOHEXOL 350 MG/ML SOLN
75.0000 mL | Freq: Once | INTRAVENOUS | Status: AC | PRN
  Administered 2023-01-01: 75 mL via INTRAVENOUS

## 2023-01-01 MED ORDER — POTASSIUM CHLORIDE 10 MEQ/100ML IV SOLN: 10.0000 meq | INTRAVENOUS | Status: DC

## 2023-01-01 NOTE — Progress Notes (Signed)
Attempted to call report to 3W; nurse unavailable at this time.  Will call this nurse back when ready.

## 2023-01-01 NOTE — Progress Notes (Signed)
Pt has returned from CT.  

## 2023-01-01 NOTE — Progress Notes (Signed)
Pt leaving unit via transport for CT

## 2023-01-01 NOTE — TOC Initial Note (Signed)
Transition of Care [facility]) - Initial/Assessment Note    Patient Details  Name: Andres White MRN: [id number] Date of Birth: 10-12-1945  Transition of Care [facility]) CM/SW Contact:    Leone Haven, RN Phone Number: 01/01/2023, 12:01 PM  Clinical Narrative:                 From home with daughter and her spouse, he has w/chair, walker, and a hospital bed.  Daughter states he has had HH with [facility] before and if needs it again they would like [facility], also if he need any DME she does not have a preference of the agency used. She states she will transport him home at dc.  He will have a palliative meeting so will need to see results of this meeting for dispo plans.   Expected Discharge Plan: Home w Home Health Services Barriers to Discharge: Continued Medical Work up   Patient Goals and CMS Choice Patient states their goals for this hospitalization and ongoing recovery are:: get better   Choice offered to / list presented to : NA      Expected Discharge Plan and Services In-house Referral: Hospice / Palliative Care Discharge Planning Services: CM Consult   Living arrangements for the past 2 months: Single Family Home                 DME Arranged: N/A DME Agency: NA                  Prior Living Arrangements/Services Living arrangements for the past 2 months: Single Family Home Lives with:: Adult Children   Do you feel safe going back to the place where you live?: Yes      Need for Family Participation in Patient Care: Yes (Comment) Care giver support system in place?: Yes (comment) Current home services: DME (w/chair, walker, hospital bed) Criminal Activity/Legal Involvement Pertinent to Current Situation/Hospitalization: No - Comment as needed  Activities of Daily Living Home Assistive Devices/Equipment: Walker (specify type) ADL Screening (condition at time of admission) Patient's cognitive ability adequate to safely complete daily activities?: No Is  the patient deaf or have difficulty hearing?: No Does the patient have difficulty seeing, even when wearing glasses/contacts?: No Does the patient have difficulty concentrating, remembering, or making decisions?: Yes Patient able to express need for assistance with ADLs?: Yes Does the patient have difficulty dressing or bathing?: Yes Independently performs ADLs?: No Communication: Independent Dressing (OT): Dependent Is this a change from baseline?: Pre-admission baseline Grooming: Dependent Is this a change from baseline?: Pre-admission baseline Feeding: Independent Bathing: Dependent Is this a change from baseline?: Pre-admission baseline Toileting: Dependent Is this a change from baseline?: Pre-admission baseline In/Out Bed: Dependent Is this a change from baseline?: Pre-admission baseline Walks in Home: Dependent (pt uses wheelchair at home) Is this a change from baseline?: Pre-admission baseline Does the patient have difficulty walking or climbing stairs?: Yes Weakness of Legs: Both Weakness of Arms/Hands: Both  Permission Sought/Granted                  Emotional Assessment         Alcohol / Substance Use: Not Applicable Psych Involvement: No (comment)  Admission diagnosis:  Hypokalemia [E87.6] Hypomagnesemia [E83.42] Lethargy [R53.83] Acute cystitis without hematuria [N30.00] Acute encephalopathy [G93.40] AKI (acute kidney injury) (HCC) [N17.9] Anemia, unspecified type [D64.9] Acute ischemic stroke [facility]) [I63.9] Patient Active Problem List   Diagnosis Date Noted   Prolonged QT interval 12/31/2022   Acute urinary retention  12/31/2022   COVID-19 virus infection 12/31/2022   Sinus bradycardia 12/31/2022   Right foot ulcer (HCC) 12/31/2022   Acute ischemic stroke (HCC) 12/31/2022   Antiplatelet or antithrombotic long-term use 12/31/2022   Frequent PVCs 12/30/2022   Acute encephalopathy 12/30/2022   Hypokalemia 08/30/2022   Acute metabolic encephalopathy  08/30/2022   UTI (urinary tract infection) 08/30/2022   Hypomagnesemia 08/30/2022   Acute renal failure (ARF) (HCC) 08/29/2022   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   Abnormal findings on esophagogastroduodenoscopy (EGD) 10/02/2020   Constipation 10/02/2020   Anemia 10/02/2020   History of seizure disorder 10/02/2020   History of stroke 10/02/2020   History of colon polyps 10/02/2020   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 10/02/2020   Endotracheally intubated 09/16/2020   Community acquired pneumonia 09/16/2020   Cervical spondylosis 09/16/2020   Vertebral artery stenosis 09/16/2020   Status epilepticus (HCC) 09/16/2020   CKD (chronic kidney disease) stage 3, GFR 30-59 ml/min (HCC) 09/16/2020   Occult blood in stools    Leukocytosis    Hyponatremia    Hypertension    Dysphagia, post-stroke    Dysesthesia    Neuropathic pain    Brainstem infarct, acute (HCC)    Acute blood loss anemia    Acute renal failure superimposed on stage 3b chronic kidney disease (HCC)    Stroke-like symptoms 06/27/2020   Right leg weakness    Left carotid stenosis 04/13/2014   PCP:  Hildred Priest, FNP Pharmacy:   [id number] [place], [place] - [address] AT [facility] OF [address] [place] [address] Phone: [phone] Fax: [phone]  [facility], [place] - [address] [place] [address] Phone: [phone] Fax: [phone]     Social Determinants of Health (SDOH) Social History: SDOH Screenings   Food Insecurity: No Food Insecurity (09/05/2022)  Housing: Low Risk  (12/11/2021)  Transportation Needs: No Transportation Needs (09/05/2022)  Alcohol Screen: Low Risk  (03/31/2022)  Depression (PHQ2-9): Low Risk  (04/15/2022)  Financial Resource Strain: Low Risk  (03/31/2022)  Physical Activity: Insufficiently Active (10/21/2022)  Social Connections: Socially Isolated (03/31/2022)  Stress: Stress  Concern Present (10/21/2022)  Tobacco Use: High Risk (12/30/2022)   SDOH Interventions:     Readmission Risk Interventions     No data to display

## 2023-01-01 NOTE — Progress Notes (Signed)
Palliative:  HPI: 77 y.o. male  with past medical history of dementia, stroke with right sided weakness deficit, seizure disorder, hypertension, HFpEF, HTN, NSVT, left carotid stenosis, bradycardia, CKD stage 3b admitted on 12/30/2022 with increased confusion with concern for UTI, acute/subacute lacunar infarct, and possible seizure. Also +COVID.   I met today at Andres White' bedside along with daughter, Andres White. Heather and I review status including testing, labs, and plans moving forward. We discussed his baseline at home. He lives with her since his initial stroke. He has right sided deficits and daughter assists him with transfers into wheelchair. He sits up for ~4 hours daily. He sleeps often throughout the day. He eats a good breakfast but appetite fluctuates throughout the rest of the day.   I discussed further with Andres White and Andres White goals of care. They have had some discussions previously regarding his wishes if his health were to worsen. Andres White reports that they discussed this after his initial stroke, ~1 year ago, and ~1 month ago. We discussed code status along with what this looks like, what this means, and poor outcomes when requiring resuscitation. Andres White confirms his desire for all measures to prolong life. He has no limitations. Andres White reports that this is consistent with previously stated wishes. Andres White acknowledges the importance of these conversations and as an only child she wants to ensure that she follows her father's wishes. She reports that Andres White is still living and also supports full code, full scope and this is consistent with family thoughts and wishes for healthcare decisions. I encouraged ongoing discussions and check ins especially in light of any further health changes and complications.   All questions/concerns addressed. Emotional support provided.   Exam: Lethargic but answers questions appropriately. Disoriented to time (baseline per family).  Right hand tremor. Breathing regular, unlabored. Abd soft.   Plan: - Full code, full scope desired.   55 min   Yong Channel, NP Palliative Medicine Team Pager [phone] (Please see [email or web address] for schedule) Team Phone [phone]    Greater than 50%  of this time was spent counseling and coordinating care related to the above assessment and plan

## 2023-01-01 NOTE — Progress Notes (Addendum)
STROKE TEAM PROGRESS NOTE   INTERVAL HISTORY Patient is seen in his room with his daughter at the bedside.  On 5/28, he presented to the ED for evaluation of 4 days of lethargy and hypotension.  He also had an episode where he became unresponsive with a left gaze deviation with no abnormal movements noted lasting about 20 seconds.  MRI of the brain has revealed an infarct of the right lower brainstem, posterior medullary junction and right medullary pyramid.  EEG has shown no seizure activity.  Vitals:   01/01/23 0427 01/01/23 0937 01/01/23 1202 01/01/23 1300  BP: 122/65 117/79 (!) 112/58 135/65  Pulse:  (!) 51  (!) 48  Resp: 18 14 19 16   Temp: 98.9 F (37.2 C) 98.8 F (37.1 C)  98.3 F (36.8 C)  TempSrc: Oral Oral  Oral  SpO2: 97% 98%  95%  Weight: 76 kg     Height:       CBC:  Recent Labs  Lab 12/30/22 1846 12/31/22 1201 01/01/23 0832  WBC 5.3 5.3 6.3  NEUTROABS 3.0  --   --   HGB 5.5* 9.9* 8.2*  HCT 19.8* 31.8* 25.9*  MCV 74.4* 76.8* 76.9*  PLT 311 23* 266   Basic Metabolic Panel:  Recent Labs  Lab 12/31/22 1201 01/01/23 0832  NA 136 136  K 3.6 2.7*  CL 107 110  CO2 20* 19*  GLUCOSE 85 100*  BUN 10 8  CREATININE 1.35* 1.16  CALCIUM 7.9* 7.5*  MG 1.9 1.8  PHOS 2.4* 2.8   Lipid Panel:  Recent Labs  Lab 01/01/23 0832  CHOL 75  TRIG 49  HDL 27*  CHOLHDL 2.8  VLDL 10  LDLCALC 38   HgbA1c: No results for input(s): "HGBA1C" in the last 168 hours. Urine Drug Screen: No results for input(s): "LABOPIA", "COCAINSCRNUR", "LABBENZ", "AMPHETMU", "THCU", "LABBARB" in the last 168 hours.  Alcohol Level No results for input(s): "ETH" in the last 168 hours.  IMAGING past 24 hours No results found.  PHYSICAL EXAM General: Drowsy, well-developed, well-nourished elderly patient in no acute distress Respiratory: Regular, unlabored respirations on room air  NEURO:  Mental Status: AA&Ox2  Speech/Language: speech is without dysarthria or aphasia.    Cranial Nerves:   II: PERRL III, IV, VI: EOMI. Eyelids elevate symmetrically.  V: Sensation is intact to light touch and symmetrical to face.  VII: Smile is symmetrical. VIII: hearing intact to voice. IX, X: Phonation is normal.  WU:[id number] shrug 5/5. XII: tongue is midline without fasciculations. Motor: 4+/5 strength to right upper extremity, 4 -/5 strength to left upper extremity, 2/5 strength to bilateral lower extremities Tone: is normal and bulk is normal Sensation- Intact to light touch bilaterally.  Coordination: FTN intact bilaterally.  Gait- deferred   ASSESSMENT/PLAN Andres White is a 77 y.o. male with history of stroke with residual right-sided weakness, dementia, seizures on Keppra, hypertension, hyperlipidemia, CHF, CAD, PAD, CKD, BPH and gout who initially presented for evaluation of 4 days of lethargy and hypotension.  He also had an episode where he became unresponsive with a left gaze deviation with no abnormal movements noted lasting about 20 seconds.  MRI of the brain has revealed an infarct of the right lower brainstem, posterior medullary junction and right medullary pyramid.  EEG has shown no seizure activity.  Stroke:  right pontomedullary junction and right medullary pyramid infarcts, etiology: Small vessel disease CT head No acute abnormality. Small vessel disease.   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  Hyperplastic right VA, severe stenosis left VA origin. MRI acute to subacute lacunar infarct of right lower brainstem-pontomedullary junction and right medullary pyramid 2D Echo EF 60 to 65%  LDL 38 HgbA1c 4.5 VTE prophylaxis -SCDs aspirin 81 mg daily prior to admission, now on No antithrombotic secondary to GI bleed and severe anemia. Recommend to resume either ASA or plavix monotherapy once anemia improves and cleared from GI  Therapy recommendations: SNF Disposition: Pending   GI bleed with severe anemia And  had initial hemoglobin of 5.5, now improved to 8.2 after 2 units of packed cells FOBT positive Plan for endoscopy this weekend with GI IV iron replacement No anticoagulants or antiplatelet agents for now, recommend to resume either aspirin or Plavix monotherapy once anemia improves and cleared from GI  History of seizure disorder Patient has history of seizure in 09/2020  on Keppra PTA EEG this admission shows no seizures or epileptiform activity Continue home Keppra 500 mg twice daily  Hypertension Home meds: Norvasc 10 mg daily Stable Long-term BP goal normotensive  Hyperlipidemia Home meds: Atorvastatin 40 mg daily, resumed in hospital LDL 38, goal < 70 Continue statin at discharge  COVID COVID PCR positive Management per primary team  UTI UA WBC 21-50 On Rocephin Management per primary team  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  Will let Dr. Corliss Skains know Outpatient follow-up with Dr. Corliss Skains  Other Stroke Risk Factors Advanced Age >/= 20  Hx stroke 06/2020 left midbrain infarct with residual right-sided weakness and dementia Coronary artery disease CHF PAD  Other Active Problems BPH Dementia Hypokalemia potassium 3.6->2.7, supplement  Hospital day # 1  Cortney E Ernestina Columbia , MSN, AGACNP-BC [facility] See Amion for schedule and pager information 01/01/2023 2:06 PM   ATTENDING NOTE: I reviewed above note and agree with the assessment and plan. Pt was seen and examined.   No family at the bedside. Pt lying in bed, awake alert, orientated to place but not time or age. No aphasia but paucity of speech able to name and repeat. Visual field is fall, no gaze palsy and no significant facial droop. BUEs no drift, however RUE 4/5 and LUE 4-/5. BLEs in rehab boots, slow moving toes and bend knees, seems left stronger then right. Sensation symmetrical subjectively, b/l FTN dysmetria. Gait not  tested.   Patient presented with episode of left gaze and unresponsive, initially concerning for seizure was given Keppra load but MRI found right pontomedullary infarct which likely explaining patient's symptoms.  Continue home Keppra but not able to start antiplatelet at this time due to severe anemia, GI consulted plan for GI workup.  Recommend antiplatelet monotherapy once anemia improved and cleared by GI.  Patient also has UTI and COVID infection, management per primary team.  PT and OT recommend SNF.  Patient will follow-up at [facility] in 2 months.  For detailed assessment and plan, please refer to above/below as I have made changes wherever appropriate.   Neurology will sign off. Please call with questions. Pt will follow up with stroke clinic NP at [facility] on 03/11/2023 as scheduled. Thanks for the consult.   Marvel Plan, MD PhD Stroke Neurology 01/01/2023 5:13 PM    To contact Stroke Continuity provider, please refer to [email or web address]. After hours, contact General Neurology

## 2023-01-01 NOTE — Progress Notes (Signed)
Pt arrived to unit from 3E. Received report from nurse. Daughter at bedside.

## 2023-01-01 NOTE — Evaluation (Signed)
Physical Therapy Evaluation Patient Details Name: Andres White MRN: [id number] DOB: 11-19-1945 Today's Date: 01/01/2023  History of Present Illness  77 y.o. male with medical history significant of dementia BPH, history of stroke with right side weakness on aspirin and Plavix with right residual deficit, history of seizure disorder, hypertension, PAD, and CHF chronic diastolic, NSVT, left carotid stenosis, history of bradycardia. Presented with increased confusion. MRI completed yesterday with results of acute to subacute lacunar of rt lower brainstem.  Clinical Impression  Pt presents with admitting diagnosis above. Co treat with OT. No family member present to determine baseline mobility however pt reports that he was ambulating with RW but previous notes state that he was WC level. Today pt was able to sit EOB +2 Total assist and required +2 Max A to stand and take sidesteps along EOB. Recommend SNF upon discharge. PT will continue to follow.     Recommendations for follow up therapy are one component of a multi-disciplinary discharge planning process, led by the attending physician.  Recommendations may be updated based on patient status, additional functional criteria and insurance authorization.  Follow Up Recommendations Can patient physically be transported by private vehicle: No     Assistance Recommended at Discharge Frequent or constant Supervision/Assistance  Patient can return home with the following  Two people to help with walking and/or transfers;A lot of help with bathing/dressing/bathroom;Assistance with cooking/housework;Direct supervision/assist for medications management;Direct supervision/assist for financial management;Assist for transportation;Help with stairs or ramp for entrance;Assistance with feeding    Equipment Recommendations Other (comment) (Per accepting facility)  Recommendations for Other Services       Functional Status Assessment Patient has had a  recent decline in their functional status and demonstrates the ability to make significant improvements in function in a reasonable and predictable amount of time.     Precautions / Restrictions Precautions Precautions: Fall Precaution Comments: COVID+ Restrictions Weight Bearing Restrictions: No      Mobility  Bed Mobility Overal bed mobility: Needs Assistance Bed Mobility: Supine to Sit, Sit to Supine     Supine to sit: Total assist, HOB elevated, +2 for physical assistance Sit to supine: Total assist, +2 for physical assistance   General bed mobility comments: VC provided to assist with bed mobility by bringing BLE towards EOB. Pt moved LLE minimally. 2 person assist to bring pt to sitting using bed pad. Total hand over hand assist to bring right hand to bed rail and try to help. Pt provided minimal assist with transition. Required VC to open his eyes.    Transfers Overall transfer level: Needs assistance Equipment used: Rolling walker (2 wheels) Transfers: Sit to/from Stand Sit to Stand: Mod assist, Max assist, +2 physical assistance           General transfer comment: Mod Ax2 to power up to standing using RW. VC and tactile cues for hand placement and RW management. VC to push into RW handles to bring trunk upright, look forward out window and squeezes glutes to achieve an upright posture. Pt unable to fully achieve an upright posture. Demonstrated flexion at hip and trunk with rounded shoulders and forward head.    Ambulation/Gait Ambulation/Gait assistance: +2 physical assistance, Max assist Gait Distance (Feet): 3 Feet Assistive device: Rolling walker (2 wheels) Gait Pattern/deviations: Staggering left, Narrow base of support, Trunk flexed, Decreased stride length       General Gait Details: +2 Max A for sidesteps along EOB  Stairs  Wheelchair Mobility    Modified Rankin (Stroke Patients Only)       Balance Overall balance assessment:  Needs assistance Sitting-balance support: Single extremity supported, Feet supported Sitting balance-Leahy Scale: Poor Sitting balance - Comments: Initially required greater assist to maintain sitting balance. Total A provided to scoot hips forward to allow feet to touch the floor and hold footboard. Pt was able to maintain sitting balance with SBA with proper positioning. Postural control: Left lateral lean Standing balance support: Bilateral upper extremity supported, During functional activity, Reliant on assistive device for balance Standing balance-Leahy Scale: Zero Standing balance comment: reliant on external support for balance. multimodel cues provided for standing posture.                             Pertinent Vitals/Pain Pain Assessment Pain Assessment: No/denies pain    Home Living Family/patient expects to be discharged to:: Private residence Living Arrangements: Children (daughter) Available Help at Discharge: Family;Available 24 hours/day Type of Home: House Home Access: Ramped entrance       Home Layout: One level Home Equipment: Agricultural consultant (2 wheels);Wheelchair - manual;BSC/3in1;Rollator (4 wheels);Shower seat Additional Comments: Unknown if information pt provided is accurate. No family present to confirm.    Prior Function               Mobility Comments: Per chart review pt has varied mobility that flucuates between W/C and walking with RW. Today pt reports that he walks with the RW. ADLs Comments: Pt reports that he does not require any assistance for bathing and dressing althoug 4 months ago, therapy evaluation stated that he received assistance for bathing and dressing.     Hand Dominance   Dominant Hand: Right    Extremity/Trunk Assessment   Upper Extremity Assessment Upper Extremity Assessment: RUE deficits/detail RUE Deficits / Details: Grossly weak in all ranges when assessed at bed level. With assist, pt was able to raise UE to  shoulder level and hold position. Due to presence of swelling in hands unable to form a full gross grasp. MMT: shoulder ranges: 3/5, elbow flexion/extension: 4/5. Intention tremor noted. LUE Deficits / Details: Grossly weak with all ranges when assessed at bed level. With assist, pt was able to raise UE to shoulder level and hold position. Due to present of swelling in hands, pt was unable to form a full gross grasp. MMT: 3-/5 shoulder ranges, elbow flexion/extension: 4/5.    Lower Extremity Assessment Lower Extremity Assessment: Generalized weakness    Cervical / Trunk Assessment Cervical / Trunk Assessment: Kyphotic  Communication   Communication: No difficulties  Cognition Arousal/Alertness: Lethargic Behavior During Therapy: Flat affect Overall Cognitive Status: Difficult to assess                                 General Comments: Pt unable to state the date. Knowns he is in the hospital and his DOB. Did not know why he was in the hospital.        General Comments General comments (skin integrity, edema, etc.): VSS on RA. BP supine: 109/62 Seated: 117/79 100% SpO2 RA    Exercises     Assessment/Plan    PT Assessment Patient needs continued PT services  PT Problem List Decreased strength;Decreased range of motion;Decreased activity tolerance;Decreased balance;Decreased mobility;Decreased coordination;Decreased cognition;Decreased knowledge of use of DME;Decreased safety awareness;Cardiopulmonary status limiting activity  PT Treatment Interventions DME instruction;Gait training;Stair training;Functional mobility training;Therapeutic activities;Therapeutic exercise;Balance training;Neuromuscular re-education;Patient/family education    PT Goals (Current goals can be found in the Care Plan section)  Acute Rehab PT Goals Patient Stated Goal: to go home PT Goal Formulation: With patient Time For Goal Achievement: 01/15/23 Potential to Achieve Goals: Poor     Frequency Min 1X/week     Co-evaluation PT/OT/SLP Co-Evaluation/Treatment: Yes Reason for Co-Treatment: Complexity of the patient's impairments (multi-system involvement);To address functional/ADL transfers PT goals addressed during session: Mobility/safety with mobility OT goals addressed during session: ADL's and self-care;Strengthening/ROM;Proper use of Adaptive equipment and DME       AM-PAC PT "6 Clicks" Mobility  Outcome Measure Help needed turning from your back to your side while in a flat bed without using bedrails?: A Lot Help needed moving from lying on your back to sitting on the side of a flat bed without using bedrails?: A Lot Help needed moving to and from a bed to a chair (including a wheelchair)?: Total Help needed standing up from a chair using your arms (e.g., wheelchair or bedside chair)?: A Lot Help needed to walk in hospital room?: Total Help needed climbing 3-5 steps with a railing? : Total 6 Click Score: 9    End of Session Equipment Utilized During Treatment: Gait belt Activity Tolerance: Patient limited by lethargy;Patient limited by fatigue Patient left: in bed;with call bell/phone within reach;with bed alarm set Nurse Communication: Mobility status PT Visit Diagnosis: Other abnormalities of gait and mobility (R26.89)    Time: 1610-9604 PT Time Calculation (min) (ACUTE ONLY): 35 min   Charges:   PT Evaluation $PT Eval High Complexity: 1 High PT Treatments $Therapeutic Activity: 8-22 mins        Shela Nevin, PT, DPT Acute Rehab Services [phone]   [name] 01/01/2023, 4:03 PM

## 2023-01-01 NOTE — Evaluation (Signed)
Occupational Therapy Evaluation Patient Details Name: Andres White MRN: [id number] DOB: 07/19/1946 Today's Date: 01/01/2023   History of Present Illness 77 y.o. male with medical history significant of dementia BPH, history of stroke with right side weakness on aspirin and Plavix with right residual deficit, history of seizure disorder, hypertension, PAD, and CHF chronic diastolic, NSVT, left carotid stenosis, history of bradycardia. Presented with increased confusion. MRI completed yesterday with results of acute to subacute lacunar of rt lower brainstem.   Clinical Impression   Pt admitted with the above diagnosis. Pt currently with functional limitations due to the deficits listed below (see OT Problem List). Unknown on accuracy of pt's information provided regarding PLOF. Will need to confirm with family when able. Pt will benefit from acute skilled OT to increase their safety and independence with ADL and functional mobility for ADL to facilitate discharge. Patient will benefit from continued inpatient follow up therapy, <3 hours/day. OT will continue to follow patient acutely.         Recommendations for follow up therapy are one component of a multi-disciplinary discharge planning process, led by the attending physician.  Recommendations may be updated based on patient status, additional functional criteria and insurance authorization.   Assistance Recommended at Discharge Frequent or constant Supervision/Assistance  Patient can return home with the following Two people to help with walking and/or transfers;Two people to help with bathing/dressing/bathroom;Assistance with cooking/housework;Assistance with feeding;Help with stairs or ramp for entrance;Assist for transportation    Functional Status Assessment  Patient has had a recent decline in their functional status and demonstrates the ability to make significant improvements in function in a reasonable and predictable amount of time.   Equipment Recommendations  Other (comment) (TBD. Need to confirm DME at home.)       Precautions / Restrictions Precautions Precautions: Fall Precaution Comments: COVID+ Restrictions Weight Bearing Restrictions: No      Mobility Bed Mobility Overal bed mobility: Needs Assistance Bed Mobility: Supine to Sit, Sit to Supine     Supine to sit: Total assist, HOB elevated, +2 for physical assistance Sit to supine: Total assist, +2 for physical assistance   General bed mobility comments: VC provided to assist with bed mobility by bringing BLE towards EOB. Pt moved LLE minimally. 2 person assist to bring pt to sitting using bed pad. Total hand over hand assist to bring right hand to bed rail and try to help. Pt provided minimal assist with transition. Required VC to open his eyes. Patient Response: Flat affect, Cooperative  Transfers Overall transfer level: Needs assistance Equipment used: Rolling walker (2 wheels) Transfers: Sit to/from Stand Sit to Stand: Mod assist, Max assist, +2 physical assistance           General transfer comment: Mod Ax2 to power up to standing using RW. VC and tactile cues for hand placement and RW management. VC to push into RW handles to bring trunk upright, look forward out window and squeezes glutes to achieve an upright posture. Pt unable to fully achieve an upright posture. Demonstrated flexion at hip and trunk with rounded shoulders and forward head.      Balance Overall balance assessment: Needs assistance Sitting-balance support: Single extremity supported, Feet supported Sitting balance-Leahy Scale: Poor Sitting balance - Comments: Initially required greater assist to maintain sitting balance. Total A provided to scoot hips forward to allow feet to touch the floor and hold footboard. Pt was able to maintain sitting balance with SBA with proper positioning. Postural control: Left lateral  lean Standing balance support: Bilateral upper extremity  supported, During functional activity, Reliant on assistive device for balance Standing balance-Leahy Scale: Zero Standing balance comment: reliant on external support for balance. multimodel cues provided for standing posture.        ADL either performed or assessed with clinical judgement   ADL Overall ADL's : Needs assistance/impaired   Eating/Feeding Details (indicate cue type and reason): refused to eat breakfast with NT this AM. Grooming: Wash/dry hands;Wash/dry face;Moderate assistance;Sitting   Upper Body Bathing: Maximal assistance;Sitting   Lower Body Bathing: Total assistance;Bed level   Upper Body Dressing : Maximal assistance;Sitting   Lower Body Dressing: Total assistance;Bed level   Toilet Transfer: +2 for physical assistance;+2 for safety/equipment;Maximal assistance;Stand-pivot;Rolling walker (2 wheels)   Toileting- Clothing Manipulation and Hygiene: Total assistance;Bed level;+2 for physical assistance        Vision Baseline Vision/History: 1 Wears glasses (pt states he is supposed to wear them all the time although doesn't.) Ability to See in Adequate Light: 0 Adequate Patient Visual Report: No change from baseline Additional Comments: Unable to assess during eval due to level of arousal. Pt preferred to keep his eyes closed for majority of session. Would open then briefly when asked.            Pertinent Vitals/Pain Pain Assessment Pain Assessment: No/denies pain     Hand Dominance Right   Extremity/Trunk Assessment Upper Extremity Assessment Upper Extremity Assessment: RUE deficits/detail;LUE deficits/detail RUE Deficits / Details: Grossly weak in all ranges when assessed at bed level. With assist, pt was able to raise UE to shoulder level and hold position. Due to presence of swelling in hands unable to form a full gross grasp. MMT: shoulder ranges: 3/5, elbow flexion/extension: 4/5. Intention tremor noted. LUE Deficits / Details: Grossly weak with  all ranges when assessed at bed level. With assist, pt was able to raise UE to shoulder level and hold position. Due to present of swelling in hands, pt was unable to form a full gross grasp. MMT: 3-/5 shoulder ranges, elbow flexion/extension: 4/5.   Lower Extremity Assessment Lower Extremity Assessment: Defer to PT evaluation;Generalized weakness   Cervical / Trunk Assessment Cervical / Trunk Assessment: Kyphotic   Communication Communication Communication: No difficulties   Cognition Arousal/Alertness: Lethargic Behavior During Therapy: Flat affect Overall Cognitive Status: Difficult to assess      General Comments: Pt unable to state the date. Knowns he is in the hospital and his DOB. Did not know why he was in the hospital.     General Comments  VSS on RA. BP supine: 109/62 Seated: 117/79 100% SpO2 RA            Home Living Family/patient expects to be discharged to:: Private residence Living Arrangements: Children (daughter) Available Help at Discharge: Family;Available 24 hours/day Type of Home: House Home Access: Ramped entrance     Home Layout: One level     Bathroom Shower/Tub: Walk-in shower         Home Equipment: Agricultural consultant (2 wheels);Wheelchair - manual;BSC/3in1;Rollator (4 wheels);Shower seat   Additional Comments: Unknown if information pt provided is accurate. No family present to confirm.      Prior Functioning/Environment               Mobility Comments: Per chart review pt has varied mobility that flucuates between W/C and walking with RW. Today pt reports that he walks with the RW. ADLs Comments: Pt reports that he does not require any assistance for bathing  and dressing althoug 4 months ago, therapy evaluation stated that he received assistance for bathing and dressing.        OT Problem List: Decreased strength;Decreased coordination;Increased edema;Decreased cognition;Decreased range of motion;Decreased activity  tolerance;Decreased safety awareness;Decreased knowledge of use of DME or AE;Impaired balance (sitting and/or standing);Impaired vision/perception;Impaired UE functional use      OT Treatment/Interventions: Self-care/ADL training;Splinting;Therapeutic exercise;Therapeutic activities;Neuromuscular education;Energy conservation;Visual/perceptual remediation/compensation;Cognitive remediation/compensation;DME and/or AE instruction;Patient/family education;Balance training    OT Goals(Current goals can be found in the care plan section) Acute Rehab OT Goals Patient Stated Goal: none stated OT Goal Formulation: Patient unable to participate in goal setting Time For Goal Achievement: 01/15/23 Potential to Achieve Goals: Fair  OT Frequency: Min 2X/week    Co-evaluation PT/OT/SLP Co-Evaluation/Treatment: Yes Reason for Co-Treatment: Complexity of the patient's impairments (multi-system involvement);To address functional/ADL transfers   OT goals addressed during session: ADL's and self-care;Strengthening/ROM;Proper use of Adaptive equipment and DME      AM-PAC OT "6 Clicks" Daily Activity     Outcome Measure Help from another person eating meals?: Total Help from another person taking care of personal grooming?: A Lot Help from another person toileting, which includes using toliet, bedpan, or urinal?: Total Help from another person bathing (including washing, rinsing, drying)?: Total Help from another person to put on and taking off regular upper body clothing?: A Lot Help from another person to put on and taking off regular lower body clothing?: Total 6 Click Score: 8   End of Session Equipment Utilized During Treatment: Gait belt;Rolling walker (2 wheels) Nurse Communication: Mobility status  Activity Tolerance: Patient limited by fatigue;Patient limited by lethargy Patient left: in bed;with call bell/phone within reach;with bed alarm set  OT Visit Diagnosis: Unsteadiness on feet  (R26.81);Muscle weakness (generalized) (M62.81);Other symptoms and signs involving cognitive function                Time: 1610-9604 OT Time Calculation (min): 37 min Charges:  OT General Charges $OT Visit: 1 Visit OT Evaluation $OT Eval High Complexity: 1 High OT Treatments $Self Care/Home Management : 8-22 mins  Limmie Patricia, OTR/L,CBIS  Supplemental OT - [facility] and [facility] Secure Chat Preferred    Oley Lahaie, Charisse March 01/01/2023, 10:12 AM

## 2023-01-01 NOTE — Progress Notes (Signed)
SLP Cancellation Note  Patient Details Name: Andres White MRN: [id number] DOB: November 23, 1945   Cancelled treatment:       Reason Eval/Treat Not Completed: Patient at procedure or test/unavailable (Pt currently with RN receiving care. SLP will follow up later as schedule allows.)  Caniya Tagle I. Vear Clock, MS, CCC-SLP Neuro Diagnostic Specialist  Acute Rehabilitation Services Office number: [phone]  Scheryl Marten 01/01/2023, 1:55 PM

## 2023-01-01 NOTE — Progress Notes (Signed)
Progress Note  Primary GI: Dr. Russella Dar  LOS: 1 day   Chief Complaint:acute on chronic anemia   Subjective   No family in the room. Patient denies complaints. Pleasantly confused.    Objective   Vital signs in last 24 hours: Temp:  [97.8 F (36.6 C)-98.9 F (37.2 C)] 98.8 F (37.1 C) (05/30 0937) Pulse Rate:  [43-64] 51 (05/30 0937) Resp:  [14-19] 14 (05/30 0937) BP: (117-133)/(65-79) 117/79 (05/30 0937) SpO2:  [93 %-100 %] 98 % (05/30 0937) Weight:  [76 kg] 76 kg (05/30 0427) Last BM Date : 12/30/22 Last BM recorded by nurses in past 5 days No data recorded  General:   male in no acute distress Heart:  Regular rate and rhythm; no murmurs Pulm: Clear anteriorly; no wheezing Abdomen: soft, nondistended, normal bowel sounds in all quadrants. Nontender without guarding. No organomegaly appreciated. Neurologic:  Alert and  oriented x4;  No focal deficits.  Psych:  Cooperative. Normal mood and affect.  Intake/Output from previous day: 05/29 0701 - 05/30 0700 In: 1758.6 [P.O.:437; I.V.:1011.2; IV Piggyback:310.4] Out: 1150 [Urine:1150] Intake/Output this shift: Total I/O In: 612.1 [P.O.:237; I.V.:375.1] Out: -   Studies/Results: VAS [place] LOWER EXTREMITY VENOUS (DVT)  Result Date: 12/31/2022  Lower Venous DVT Study Patient Name:  ADEDEJI STROSNIDER  Date of Exam:   12/31/2022 Medical Rec #: [id number]       Accession #:    [id number] Date of Birth: 04-20-1946       Patient Gender: M Patient Age:   33 years Exam Location:  [facility] Procedure:      VAS [place] LOWER EXTREMITY VENOUS (DVT) Referring Phys: Jonny Ruiz DOUTOVA --------------------------------------------------------------------------------  Indications: Swelling.  Risk Factors: COVID 19 positive. Comparison Study: No prior studies. Performing Technologist: Chanda Busing RVT  Examination Guidelines: A complete evaluation includes B-mode imaging, spectral Doppler, color Doppler, and power Doppler as needed of all  accessible portions of each vessel. Bilateral testing is considered an integral part of a complete examination. Limited examinations for reoccurring indications may be performed as noted. The reflux portion of the exam is performed with the patient in reverse Trendelenburg.  +-----+---------------+---------+-----------+----------+--------------+ RIGHTCompressibilityPhasicitySpontaneityPropertiesThrombus Aging +-----+---------------+---------+-----------+----------+--------------+ CFV  Full           Yes      Yes                                 +-----+---------------+---------+-----------+----------+--------------+   +---------+---------------+---------+-----------+----------+--------------+ LEFT     CompressibilityPhasicitySpontaneityPropertiesThrombus Aging +---------+---------------+---------+-----------+----------+--------------+ CFV      Full           Yes      Yes                                 +---------+---------------+---------+-----------+----------+--------------+ SFJ      Full                                                        +---------+---------------+---------+-----------+----------+--------------+ FV Prox  Full                                                        +---------+---------------+---------+-----------+----------+--------------+  FV Mid   Full                                                        +---------+---------------+---------+-----------+----------+--------------+ FV DistalFull                                                        +---------+---------------+---------+-----------+----------+--------------+ PFV      Full                                                        +---------+---------------+---------+-----------+----------+--------------+ POP      Full           Yes      Yes                                 +---------+---------------+---------+-----------+----------+--------------+ PTV      Full                                                         +---------+---------------+---------+-----------+----------+--------------+ PERO     Full                                                        +---------+---------------+---------+-----------+----------+--------------+    Summary: RIGHT: - No evidence of common femoral vein obstruction.  LEFT: - There is no evidence of deep vein thrombosis in the lower extremity.  - No cystic structure found in the popliteal fossa.  *See table(s) above for measurements and observations. Electronically signed by Coral Else MD on 12/31/2022 at 5:04:29 PM.    Final    ECHOCARDIOGRAM COMPLETE  Result Date: 12/31/2022    ECHOCARDIOGRAM REPORT   Patient Name:   USAMAH NETHERCOTT Date of Exam: 12/31/2022 Medical Rec #:  [id number]      Height:       68.0 in Accession #:    [id number]     Weight:       164.9 lb Date of Birth:  1945-11-27      BSA:          1.883 m Patient Age:    77 years       BP:           122/52 mmHg Patient Gender: M              HR:           50 bpm. Exam Location:  Inpatient Procedure: 2D Echo, Cardiac Doppler and Color Doppler Indications:    Stroke  History:        Patient has prior history of Echocardiogram  examinations, most                 recent 06/25/2020. CHF, Carotid Disease, CKD, Stroke and PAD,                 Arrythmias:PVC and Bradycardia; Risk Factors:Hypertension and                 Current Smoker.  Sonographer:    Wallie Char Referring Phys: [id number] ANASTASSIA DOUTOVA IMPRESSIONS  1. Left ventricular ejection fraction, by estimation, is 60 to 65%. The left ventricle has normal function. The left ventricle has no regional wall motion abnormalities. There is moderate asymmetric left ventricular hypertrophy of the inferior segment. Left ventricular diastolic parameters are indeterminate.  2. Right ventricular systolic function is normal. The right ventricular size is normal.  3. The mitral valve is grossly normal. No evidence of mitral  valve regurgitation. No evidence of mitral stenosis.  4. The aortic valve is tricuspid. There is mild calcification of the aortic valve. Aortic valve regurgitation is not visualized. Aortic valve sclerosis is present, with no evidence of aortic valve stenosis. Comparison(s): No significant change from prior study. FINDINGS  Left Ventricle: Left ventricular ejection fraction, by estimation, is 60 to 65%. The left ventricle has normal function. The left ventricle has no regional wall motion abnormalities. The left ventricular internal cavity size was normal in size. There is  moderate asymmetric left ventricular hypertrophy of the inferior segment. Left ventricular diastolic parameters are indeterminate. Right Ventricle: The right ventricular size is normal. No increase in right ventricular wall thickness. Right ventricular systolic function is normal. Left Atrium: Left atrial size was normal in size. Right Atrium: Right atrial size was normal in size. Pericardium: Trivial pericardial effusion is present. The pericardial effusion is circumferential. Mitral Valve: The mitral valve is grossly normal. No evidence of mitral valve regurgitation. No evidence of mitral valve stenosis. MV peak gradient, 3.3 mmHg. The mean mitral valve gradient is 1.0 mmHg. Tricuspid Valve: The tricuspid valve is normal in structure. Tricuspid valve regurgitation is not demonstrated. No evidence of tricuspid stenosis. Aortic Valve: The aortic valve is tricuspid. There is mild calcification of the aortic valve. There is mild aortic valve annular calcification. Aortic valve regurgitation is not visualized. Aortic valve sclerosis is present, with no evidence of aortic valve stenosis. Aortic valve mean gradient measures 4.0 mmHg. Aortic valve peak gradient measures 5.9 mmHg. Aortic valve area, by VTI measures 2.01 cm. Pulmonic Valve: The pulmonic valve was normal in structure. Pulmonic valve regurgitation is not visualized. No evidence of  pulmonic stenosis. Aorta: The aortic root and ascending aorta are structurally normal, with no evidence of dilitation. IAS/Shunts: No atrial level shunt detected by color flow Doppler.  LEFT VENTRICLE PLAX 2D LVIDd:         4.80 cm     Diastology LVIDs:         3.10 cm     LV e' medial:    7.19 cm/s LV PW:         1.30 cm     LV E/e' medial:  11.2 LV IVS:        1.10 cm     LV e' lateral:   6.65 cm/s LVOT diam:     1.90 cm     LV E/e' lateral: 12.1 LV SV:         57 LV SV Index:   30 LVOT Area:     2.84 cm  LV Volumes (MOD) LV vol  d, MOD A2C: 54.6 ml LV vol d, MOD A4C: 84.4 ml LV vol s, MOD A2C: 23.7 ml LV vol s, MOD A4C: 35.8 ml LV SV MOD A2C:     30.9 ml LV SV MOD A4C:     84.4 ml LV SV MOD BP:      42.2 ml RIGHT VENTRICLE RV Basal diam:  3.00 cm RV S prime:     11.40 cm/s TAPSE (M-mode): 2.3 cm LEFT ATRIUM             Index        RIGHT ATRIUM           Index LA diam:        3.70 cm 1.97 cm/m   RA Area:     12.60 cm LA Vol (A2C):   41.0 ml 21.78 ml/m  RA Volume:   24.20 ml  12.85 ml/m LA Vol (A4C):   45.3 ml 24.06 ml/m LA Biplane Vol: 43.4 ml 23.05 ml/m  AORTIC VALVE AV Area (Vmax):    2.08 cm AV Area (Vmean):   1.98 cm AV Area (VTI):     2.01 cm AV Vmax:           121.50 cm/s AV Vmean:          89.050 cm/s AV VTI:            0.283 m AV Peak Grad:      5.9 mmHg AV Mean Grad:      4.0 mmHg LVOT Vmax:         89.10 cm/s LVOT Vmean:        62.200 cm/s LVOT VTI:          0.201 m LVOT/AV VTI ratio: 0.71  AORTA Ao Root diam: 3.40 cm Ao Asc diam:  3.50 cm MITRAL VALVE               TRICUSPID VALVE MV Area (PHT): 3.59 cm    TR Peak grad:   22.7 mmHg MV Area VTI:   1.58 cm    TR Vmax:        238.00 cm/s MV Peak grad:  3.3 mmHg MV Mean grad:  1.0 mmHg    SHUNTS MV Vmax:       0.90 m/s    Systemic VTI:  0.20 m MV Vmean:      46.8 cm/s   Systemic Diam: 1.90 cm MV Decel Time: 212 msec MV E velocity: 80.65 cm/s MV A velocity: 98.00 cm/s MV E/A ratio:  0.82 Riley Lam MD Electronically signed by Riley Lam MD Signature Date/Time: 12/31/2022/2:43:57 PM    Final    [place] EKG SITE RITE  Result Date: 12/31/2022 If Site Rite image not attached, placement could not be confirmed due to current cardiac rhythm.  EEG adult  Result Date: 12/31/2022 Charlsie Quest, MD     12/31/2022  1:43 PM Patient Name: KYROS KREMIN MRN: [id number] Epilepsy Attending: Charlsie Quest Referring Physician/Provider: Caryl Pina, MD Date: 12/31/2022 Duration: 23.45 mins Patient history:  77 year old male with history of dementia, BPH stroke with right-sided weakness, seizure disorder, hypertension, peripheral artery disease, diastolic CHF, carotid stenosis, bradycardia who presented with confusion. EEG to evaluate for seizure. Level of alertness: Awake AEDs during EEG study: LEV Technical aspects: This EEG study was done with scalp electrodes positioned according to the 10-20 International system of electrode placement. Electrical activity was reviewed with band pass filter of 1-70Hz , sensitivity of 7 uV/mm, display  speed of 57mm/sec with a 60Hz  notched filter applied as appropriate. EEG data were recorded continuously and digitally stored.  Video monitoring was available and reviewed as appropriate. Description: The posterior dominant rhythm consists of 7 Hz activity of moderate voltage (25-35 uV) seen predominantly in posterior head regions, symmetric and reactive to eye opening and eye closing. EEG showed continuous generalized 5 to 6 Hz theta slowing. Hyperventilation and photic stimulation were not performed.   ABNORMALITY - Continuous slow, generalized - Background slow IMPRESSION: This study is suggestive of mild to moderate diffuse encephalopathy, nonspecific etiology. No seizures or epileptiform discharges were seen throughout the recording. Charlsie Quest   MR BRAIN WO CONTRAST  Result Date: 12/31/2022 CLINICAL DATA:  77 year old male with delirium. Family reports left-side is weaker for the past 4 days.  History of previous strokes, on aspirin and Plavix. EXAM: MRI HEAD WITHOUT CONTRAST TECHNIQUE: Multiplanar, multiecho pulse sequences of the brain and surrounding structures were obtained without intravenous contrast. COMPARISON:  Head CT this morning.  Previous brain MRI 09/20/2020. FINDINGS: Brain: Acute lacunar infarct of the right lower brainstem. This is best seen on the axial diffusion tracking from the paracentral pontomedullary junction on the right toward the right medullary pyramid. See series 3, images 10 and 11. There is associated FLAIR hyperintensity. Subtle if any T1 signal correlation. No hemorrhage or mass effect is associated. No other restricted diffusion. Chronic infarcts with encephalomalacia in the bilateral cerebellum, bilateral thalami, right basal ganglia, posterior right MCA territory, anterior left MCA territory. Some of the cortical encephalomalacia is new compared to 2022. Minimal associated hemosiderin on SWI. Patchy and confluent other cerebral white matter T2 and FLAIR hyperintensity also appears mildly progressed. Cavum septum pellucidum, normal variant. No midline shift, mass effect, evidence of mass lesion, ventriculomegaly, extra-axial collection or acute intracranial hemorrhage. Cervicomedullary junction and pituitary are within normal limits. Vascular: Major intracranial vascular flow voids are stable since 2022. Skull and upper cervical spine: Visualized bone marrow signal is within normal limits. Chronic cervical spine disc degeneration. Sinuses/Orbits: Stable chronic orbital wall fractures better demonstrated by CT this morning. Paranasal Visualized paranasal sinuses and mastoids are stable and well aerated. Other: Grossly normal visible internal auditory structures. Negative visible scalp and face. IMPRESSION: 1. Acute to subacute lacunar infarct of the Right lower brainstem - Pontomedullary junction and right medullary pyramid. No associated hemorrhage or mass effect. 2.  Underlying fairly advanced chronic ischemic disease with other progression since a 2022 MRI. Electronically Signed   By: Odessa Fleming M.D.   On: 12/31/2022 10:17   CT HEAD WO CONTRAST ( )  Result Date: 12/31/2022 CLINICAL DATA:  77 year old male with delirium. EXAM: CT HEAD WITHOUT CONTRAST TECHNIQUE: Contiguous axial images were obtained from the base of the skull through the vertex without intravenous contrast. RADIATION DOSE REDUCTION: This exam was performed according to the departmental dose-optimization program which includes automated exposure control, adjustment of the mA and/or kV according to patient size and/or use of iterative reconstruction technique. COMPARISON:  Brain MRI 09/20/2020.  Head CT 08/29/2022. FINDINGS: Brain: Cavum septum pellucidum, normal variant. No ventriculomegaly. No midline shift, mass effect, or evidence of intracranial mass lesion. No acute intracranial hemorrhage identified. Chronic encephalomalacia in the posterior right MCA territory with scattered small chronic infarcts in the bilateral thalami, bilateral cerebellum. Patchy and confluent bilateral cerebral white matter hypodensity. Stable gray-white matter differentiation throughout the brain. No cortically based acute infarct identified. Vascular: No suspicious intracranial vascular hyperdensity. Skull: Chronic lamina papyracea and left orbital floor  fractures. No acute osseous abnormality identified. Sinuses/Orbits: Visualized paranasal sinuses and mastoids are stable and well aerated. Other: Chronic left orbital floor fracture. No acute orbit or scalp soft tissue finding identified. IMPRESSION: 1. No acute intracranial abnormality identified. Stable non contrast CT appearance of advanced chronic ischemic disease in the brain. 2. Chronic lamina papyracea and left orbital floor fractures. Electronically Signed   By: Odessa Fleming M.D.   On: 12/31/2022 05:10   DG Chest Portable 1 View  Result Date: 12/30/2022 CLINICAL DATA:   Lethargic Hypotension, lethargy and weakness x3 days. Seizure today. Hx of stroke, HTN. EXAM: PORTABLE CHEST 1 VIEW.  Patient is rotated. COMPARISON:  Chest x-ray 08/29/2022 FINDINGS: The heart and mediastinal contours are within normal limits. Aortic calcification. Developed retrocardiac airspace opacity. No pulmonary edema. No pleural effusion. No pneumothorax. No acute osseous abnormality. IMPRESSION: 1. Developing retrocardiac airspace opacity with limited evaluation due to patient rotation. Recommend repeat PA and lateral view of the chest. 2.  Aortic Atherosclerosis (ICD10-I70.0). Electronically Signed   By: Tish Frederickson M.D.   On: 12/30/2022 19:50    Lab Results: Recent Labs    12/30/22 1846 12/31/22 1201 01/01/23 0832  WBC 5.3 5.3 6.3  HGB 5.5* 9.9* 8.2*  HCT 19.8* 31.8* 25.9*  PLT 311 23* 266   BMET Recent Labs    12/30/22 1846 12/31/22 1201 01/01/23 0832  NA 135 136 136  K 2.2* 3.6 2.7*  CL 104 107 110  CO2 22 20* 19*  GLUCOSE 112* 85 100*  BUN 11 10 8   CREATININE 1.68* 1.35* 1.16  CALCIUM 7.8* 7.9* 7.5*   LFT Recent Labs    12/31/22 1201  PROT 6.8  ALBUMIN 2.3*  AST 40  ALT 14  ALKPHOS 70  BILITOT 0.6   PT/INR Recent Labs    12/30/22 1846  LABPROT 15.1  INR 1.2     Scheduled Meds:  sodium chloride   Intravenous Once   atorvastatin  40 mg Oral Daily   Chlorhexidine Gluconate Cloth  6 each Topical Q0600   citalopram  10 mg Oral Daily   feeding supplement  237 mL Oral TID BM   multivitamin with minerals  1 tablet Oral Daily   pantoprazole  40 mg Oral BID   potassium chloride  40 mEq Oral Q2H   QUEtiapine  50 mg Oral QHS   tamsulosin  0.4 mg Oral QPC supper   Continuous Infusions:  dextrose 5 % and 0.9 % NaCl 75 mL/hr at 01/01/23 0900   ferric gluconate (FERRLECIT) IVPB 250 mg (01/01/23 1120)   levETIRAcetam 750 mg (01/01/23 1050)      Patient profile:   77 year old male with medical history significant for dementia, CVA with residual  right sided weakness, seizures, bradycardia, CHF, carotid stenosis, duodenal adenoma, NSVT admitted with AKI, severe elctrolye disturbances, acute on chronic microcytic anemia after family brought patient to ED with chief complaint of increased lethargy and confusion.  Workup negative for UA, MRI shows acute/subacute stroke. Holding anticoag in setting of GI bleed. Has had previous workup for anemia (see consult note)    Impression:   Acute on chronic anemia; history of duodenal adenomas -Hgb 8.2, s/p 2 units PRBCs -Iron 27, TIBC 242, Sat 11% - ferritin 31 - Vit b12 > 7,500 -Positive fecal occult -Potassium 2.7 Continuing to struggle with electrolyte abnormalities. Will need to have potassium above 3.0 prior to consideration of procedure. Hgb stable. No complaints today  Acute metabolic encephalopathy -UA positive for  pyuria and leukocytes, negative for nitrates.  Urine culture showed no growth. Abx d/c -CT head shows advanced chronic ischemic disease, no acute abnormality. - MRI showed acute to subacute lacunar infarct of the Right lower brainstem :Pontomedullary junction and right medullary pyramid. Holding initiation of anticoagulation/antiplatelet in setting of possible GI bleed - neurology following - Last dose plavix 5/28 AM   Arrhythmia (ectopy) - evaluated by cardiology. Found to have rare PVCs and baseline bradycardia. They suspect arrhythmia is secondary to metabolic abnormalities   AKI -Likely in the setting of electrolyte abnormality/dehydration - Potassium 2.7 - Magnesium 1.4 - Sodium 136, improved - BUN 8, Cr. 1.16, GFR >60, resolved   Plan:   - Replenish potassium - tentatively plan for enteroscopy +/- VCE possibly Saturday/Sunday pending labs and awaiting plavix washout. - Continue daily CBC and transfuse as needed to maintain HGB > 7   Prairie Stenberg M Roselani Grajeda  01/01/2023, 11:32 AM

## 2023-01-01 NOTE — Progress Notes (Addendum)
PROGRESS NOTE  Andres White  VWU:[id number] DOB: 1945/11/26 DOA: 12/30/2022 PCP: Hildred Priest, FNP   Brief Narrative: Patient is a 77 year old male with history of dementia, BPH stroke with right-sided weakness, seizure disorder, hypertension, peripheral artery disease, diastolic CHF, carotid stenosis, bradycardia who presented with confusion .  As per the report, patient was lethargic for 4 days.  On presentation he was bradycardic, hypotensive.  He was recently treated for UTI with Levaquin.  Lab work on presentation showed potassium of 2.2, hemoglobin in the range of 5.  MRI showed acute to subacute ischemic stroke.  GI, cardiology, neurology, palliative care consulted.  Assessment & Plan:  Principal Problem:   Acute encephalopathy Active Problems:   Left carotid stenosis   Stroke-like symptoms   Acute blood loss anemia   Acute renal failure superimposed on stage 3b chronic kidney disease (HCC)   Hypertension   CKD (chronic kidney disease) stage 3, GFR 30-59 ml/min (HCC)   Duodenal adenoma   History of seizure disorder   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   Hypokalemia   Acute metabolic encephalopathy   UTI (urinary tract infection)   Hypomagnesemia   Frequent PVCs   Prolonged QT interval   Acute urinary retention   COVID-19 virus infection   Sinus bradycardia   Right foot ulcer (HCC)   Acute ischemic stroke (HCC)   Antiplatelet or antithrombotic long-term use    Acute encephalopathy: History of known dementia.  More confused from baseline.  Lethargic for 4 days.  Multifactorial but likely from stroke.Currently he is oriented to place only .  Obeys commands.  Has generalized weakness.  Acute/Subacute stroke: MRI showed  acute to subacute lacunar infarct of the Right lower brainstem :Pontomedullary junction and right medullary pyramid.  [name]  Has history of stroke.  Ambulates with walker/wheelchair.  PT/OT/speech evaluation.  Initiated stroke workup.Not a candidate for  antiplatelet or anticoagulation at the moment due to possible GI bleed.  Neurology following.  Suspected UTI: UA suspicious for UTI, started on ceftriaxone.  No leukocytosis or fever.  Urine culture showed insignificant growth.  Antibiotics discontinued  H/O seizure.  Seizure could not be ruled out.  Spot EEG did not show any seizure or epileptiform activity.   currently on IV Keppra.  Neurology  consulted and following.  Severe anemia/GI bleed: Hemoglobin in the range of 5 on presentation.  Transfused with 2 units of PRBC.  FOBT positive.  GI consulted .  Monitor H&H.  This morning Hb in the range of 8.  Patient has history of chronic gastritis.No hematochezia or melena as per the daughter.  GI planning for enteroscopy /video   capsule endoscopy. Iron studies showed low iron, being given IV iron  Thrombocytopenia: Noted on 5/29.  Platelets level this morning is normal.Lab error? Considering PICC line placement due to difficult IV access  Severe hypokalemia: Currently being supplemented and  monitored.  AKI on CKD stage IIIb: Foley catheter placement at Emergency Department for urinary retention.  Presented with creatinine of 1.6, kidney function improving  Frequent PVCs/bradycardia/prolonged QTc: Cardiology consulted.  Has chronic sinus bradycardia.  Cardiology signed off Patient has prolonged QT.  Monitor on telemetry.  TSH normal.  Will check follow-up EKG  Covid: This could also  have attributed to weakness.  Currently on room air.  Continue supportive care.  Not a started on steroids or antivirals.  Right foot ulcer: appears to be noninfected.  Wound care consulted and following  Goals of care: Patient has multiple comorbidities.  Has very poor oral intake.  Due to his advanced age and poor prognosis, he might be a candidate for hospice.  Palliative care following  Nutrition Problem: Increased nutrient needs Etiology: catabolic illness (HF, COVID, increased needs for wound healing)     DVT prophylaxis:SCDs Start: 12/30/22 2309     Code Status: Full Code  Family Communication: Called and discussed with daughter on phone on 5/30  Patient status:Inpatient  Patient is from :Home  Anticipated discharge ZO:XWRU vs SnF  Estimated DC date:not sure   Consultants: Cardiology,GI, neurology  Procedures:None  Antimicrobials:  Anti-infectives (From admission, onward)    Start     Dose/Rate Route Frequency Ordered Stop   12/30/22 2345  cefTRIAXone (ROCEPHIN) 1 g in sodium chloride 0.9 % 100 mL IVPB  Status:  Discontinued        1 g 200 mL/hr over 30 Minutes Intravenous Daily at bedtime 12/30/22 2317 01/01/23 0928       Subjective: Patient seen and examined at bedside today.  Hemodynamically stable.  Lying in bed.  Looks overall comfortable.  PT/OT were in the room as well.  He talks well and denies any complaints today.  He knows in the hospital but not oriented to month or day.  He could tell his birthday.  He obeys commands but has generalized weakness  Objective: Vitals:   12/31/22 2000 12/31/22 2358 01/01/23 0427 01/01/23 0937  BP: 129/69 132/69 122/65 117/79  Pulse: (!) 44 64  (!) 51  Resp: 19 19 18 14   Temp: 98.5 F (36.9 C) 98.7 F (37.1 C) 98.9 F (37.2 C) 98.8 F (37.1 C)  TempSrc: Oral Oral Oral Oral  SpO2: 93% 96% 97% 98%  Weight:   76 kg   Height:        Intake/Output Summary (Last 24 hours) at 01/01/2023 1118 Last data filed at 01/01/2023 0900 Gross per 24 hour  Intake 2133.68 ml  Output 1150 ml  Net 983.68 ml   Filed Weights   12/30/22 2215 12/31/22 0500 01/01/23 0427  Weight: 76.4 kg 74.8 kg 76 kg    Examination:   General exam: Overall comfortable, not in distress, appears weak, deconditioned HEENT: PERRL Respiratory system:  no wheezes or crackles  Cardiovascular system: S1 & S2 heard, RRR.  Gastrointestinal system: Abdomen is nondistended, soft and nontender. Central nervous system: Alert and oriented to place  only Extremities: Trace lower EXTR  edema, no clubbing ,no cyanosis Skin: Right heel ulcer   Data Reviewed: I have personally reviewed following labs and imaging studies  CBC: Recent Labs  Lab 12/30/22 1846 12/31/22 1201 01/01/23 0832  WBC 5.3 5.3 6.3  NEUTROABS 3.0  --   --   HGB 5.5* 9.9* 8.2*  HCT 19.8* 31.8* 25.9*  MCV 74.4* 76.8* 76.9*  PLT 311 23* 266   Basic Metabolic Panel: Recent Labs  Lab 12/30/22 1846 12/31/22 1201 01/01/23 0832  NA 135 136 136  K 2.2* 3.6 2.7*  CL 104 107 110  CO2 22 20* 19*  GLUCOSE 112* 85 100*  BUN 11 10 8   CREATININE 1.68* 1.35* 1.16  CALCIUM 7.8* 7.9* 7.5*  MG 1.4* 1.9 1.8  PHOS  --  2.4* 2.8     Recent Results (from the past 240 hour(s))  Culture, blood (Routine x 2)     Status: None (Preliminary result)   Collection Time: 12/30/22  6:46 PM   Specimen: BLOOD  Result Value Ref Range Status   Specimen Description BLOOD RIGHT ANTECUBITAL  Final   Special Requests   Final    BOTTLES DRAWN AEROBIC AND ANAEROBIC Blood Culture results may not be optimal due to an inadequate volume of blood received in culture bottles   Culture   Final    NO GROWTH 2 DAYS Performed at [facility], [address], [place], [place] [address]    Report Status PENDING  Incomplete  Urine Culture     Status: Abnormal   Collection Time: 12/30/22  9:06 PM   Specimen: Urine, Random  Result Value Ref Range Status   Specimen Description URINE, RANDOM  Final   Special Requests NONE Reflexed from [id number]  Final   Culture (A)  Final    <10,000 COLONIES/mL INSIGNIFICANT GROWTH Performed at [facility], [address] [place], [place] [address]    Report Status 12/31/2022 FINAL  Final  SARS Coronavirus 2 by RT PCR (hospital order, performed in [facility] hospital lab) *cepheid single result test* Anterior Nasal Swab     Status: Abnormal   Collection Time: 12/30/22  9:17 PM   Specimen: Anterior Nasal Swab  Result Value Ref Range Status   SARS  Coronavirus 2 by RT PCR POSITIVE (A) NEGATIVE Final    Comment: Performed at [facility], [address], [place], [place] [address]  Culture, blood (Routine X 2) w Reflex to ID Panel     Status: None (Preliminary result)   Collection Time: 12/31/22  8:07 AM   Specimen: BLOOD LEFT HAND  Result Value Ref Range Status   Specimen Description BLOOD LEFT HAND  Final   Special Requests   Final    BOTTLES DRAWN AEROBIC ONLY Blood Culture results may not be optimal due to an inadequate volume of blood received in culture bottles   Culture   Final    NO GROWTH < 24 HOURS Performed at [facility], [address], [place], [place] [address]    Report Status PENDING  Incomplete     Radiology Studies: VAS [place] LOWER EXTREMITY VENOUS (DVT)  Result Date: 12/31/2022  Lower Venous DVT Study Patient Name:  AXTIN GERVACIO  Date of Exam:   12/31/2022 Medical Rec #: [id number]       Accession #:    [id number] Date of Birth: [birth year]       Patient Gender: M Patient Age:   30 years Exam Location:  [facility] Procedure:      VAS [place] LOWER EXTREMITY VENOUS (DVT) Referring Phys: Jonny Ruiz DOUTOVA --------------------------------------------------------------------------------  Indications: Swelling.  Risk Factors: COVID 19 positive. Comparison Study: No prior studies. Performing Technologist: Chanda Busing RVT  Examination Guidelines: A complete evaluation includes B-mode imaging, spectral Doppler, color Doppler, and power Doppler as needed of all accessible portions of each vessel. Bilateral testing is considered an integral part of a complete examination. Limited examinations for reoccurring indications may be performed as noted. The reflux portion of the exam is performed with the patient in reverse Trendelenburg.  +-----+---------------+---------+-----------+----------+--------------+ RIGHTCompressibilityPhasicitySpontaneityPropertiesThrombus Aging  +-----+---------------+---------+-----------+----------+--------------+ CFV  Full           Yes      Yes                                 +-----+---------------+---------+-----------+----------+--------------+   +---------+---------------+---------+-----------+----------+--------------+ LEFT     CompressibilityPhasicitySpontaneityPropertiesThrombus Aging +---------+---------------+---------+-----------+----------+--------------+ CFV      Full  Yes      Yes                                 +---------+---------------+---------+-----------+----------+--------------+ SFJ      Full                                                        +---------+---------------+---------+-----------+----------+--------------+ FV Prox  Full                                                        +---------+---------------+---------+-----------+----------+--------------+ FV Mid   Full                                                        +---------+---------------+---------+-----------+----------+--------------+ FV DistalFull                                                        +---------+---------------+---------+-----------+----------+--------------+ PFV      Full                                                        +---------+---------------+---------+-----------+----------+--------------+ POP      Full           Yes      Yes                                 +---------+---------------+---------+-----------+----------+--------------+ PTV      Full                                                        +---------+---------------+---------+-----------+----------+--------------+ PERO     Full                                                        +---------+---------------+---------+-----------+----------+--------------+    Summary: RIGHT: - No evidence of common femoral vein obstruction.  LEFT: - There is no evidence of deep vein thrombosis in the lower  extremity.  - No cystic structure found in the popliteal fossa.  *See table(s) above for measurements and observations. Electronically signed by Coral Else MD on 12/31/2022 at 5:04:29 PM.    Final    ECHOCARDIOGRAM COMPLETE  Result Date: 12/31/2022  ECHOCARDIOGRAM REPORT   Patient Name:   MYRLE NEWBANKS Date of Exam: 12/31/2022 Medical Rec #:  [id number]      Height:       68.0 in Accession #:    [id number]     Weight:       164.9 lb Date of Birth:  October 26, 1945      BSA:          1.883 m Patient Age:    18 years       BP:           122/52 mmHg Patient Gender: M              HR:           50 bpm. Exam Location:  Inpatient Procedure: 2D Echo, Cardiac Doppler and Color Doppler Indications:    Stroke  History:        Patient has prior history of Echocardiogram examinations, most                 recent 06/25/2020. CHF, Carotid Disease, CKD, Stroke and PAD,                 Arrythmias:PVC and Bradycardia; Risk Factors:Hypertension and                 Current Smoker.  Sonographer:    Wallie Char Referring Phys: [id number] ANASTASSIA DOUTOVA IMPRESSIONS  1. Left ventricular ejection fraction, by estimation, is 60 to 65%. The left ventricle has normal function. The left ventricle has no regional wall motion abnormalities. There is moderate asymmetric left ventricular hypertrophy of the inferior segment. Left ventricular diastolic parameters are indeterminate.  2. Right ventricular systolic function is normal. The right ventricular size is normal.  3. The mitral valve is grossly normal. No evidence of mitral valve regurgitation. No evidence of mitral stenosis.  4. The aortic valve is tricuspid. There is mild calcification of the aortic valve. Aortic valve regurgitation is not visualized. Aortic valve sclerosis is present, with no evidence of aortic valve stenosis. Comparison(s): No significant change from prior study. FINDINGS  Left Ventricle: Left ventricular ejection fraction, by estimation, is 60 to 65%. The left  ventricle has normal function. The left ventricle has no regional wall motion abnormalities. The left ventricular internal cavity size was normal in size. There is  moderate asymmetric left ventricular hypertrophy of the inferior segment. Left ventricular diastolic parameters are indeterminate. Right Ventricle: The right ventricular size is normal. No increase in right ventricular wall thickness. Right ventricular systolic function is normal. Left Atrium: Left atrial size was normal in size. Right Atrium: Right atrial size was normal in size. Pericardium: Trivial pericardial effusion is present. The pericardial effusion is circumferential. Mitral Valve: The mitral valve is grossly normal. No evidence of mitral valve regurgitation. No evidence of mitral valve stenosis. MV peak gradient, 3.3 mmHg. The mean mitral valve gradient is 1.0 mmHg. Tricuspid Valve: The tricuspid valve is normal in structure. Tricuspid valve regurgitation is not demonstrated. No evidence of tricuspid stenosis. Aortic Valve: The aortic valve is tricuspid. There is mild calcification of the aortic valve. There is mild aortic valve annular calcification. Aortic valve regurgitation is not visualized. Aortic valve sclerosis is present, with no evidence of aortic valve stenosis. Aortic valve mean gradient measures 4.0 mmHg. Aortic valve peak gradient measures 5.9 mmHg. Aortic valve area, by VTI measures 2.01 cm. Pulmonic Valve: The pulmonic valve was normal in structure. Pulmonic valve regurgitation is not visualized. No evidence of  pulmonic stenosis. Aorta: The aortic root and ascending aorta are structurally normal, with no evidence of dilitation. IAS/Shunts: No atrial level shunt detected by color flow Doppler.  LEFT VENTRICLE PLAX 2D LVIDd:         4.80 cm     Diastology LVIDs:         3.10 cm     LV e' medial:    7.19 cm/s LV PW:         1.30 cm     LV E/e' medial:  11.2 LV IVS:        1.10 cm     LV e' lateral:   6.65 cm/s LVOT diam:     1.90  cm     LV E/e' lateral: 12.1 LV SV:         57 LV SV Index:   30 LVOT Area:     2.84 cm  LV Volumes (MOD) LV vol d, MOD A2C: 54.6 ml LV vol d, MOD A4C: 84.4 ml LV vol s, MOD A2C: 23.7 ml LV vol s, MOD A4C: 35.8 ml LV SV MOD A2C:     30.9 ml LV SV MOD A4C:     84.4 ml LV SV MOD BP:      42.2 ml RIGHT VENTRICLE RV Basal diam:  3.00 cm RV S prime:     11.40 cm/s TAPSE (M-mode): 2.3 cm LEFT ATRIUM             Index        RIGHT ATRIUM           Index LA diam:        3.70 cm 1.97 cm/m   RA Area:     12.60 cm LA Vol (A2C):   41.0 ml 21.78 ml/m  RA Volume:   24.20 ml  12.85 ml/m LA Vol (A4C):   45.3 ml 24.06 ml/m LA Biplane Vol: 43.4 ml 23.05 ml/m  AORTIC VALVE AV Area (Vmax):    2.08 cm AV Area (Vmean):   1.98 cm AV Area (VTI):     2.01 cm AV Vmax:           121.50 cm/s AV Vmean:          89.050 cm/s AV VTI:            0.283 m AV Peak Grad:      5.9 mmHg AV Mean Grad:      4.0 mmHg LVOT Vmax:         89.10 cm/s LVOT Vmean:        62.200 cm/s LVOT VTI:          0.201 m LVOT/AV VTI ratio: 0.71  AORTA Ao Root diam: 3.40 cm Ao Asc diam:  3.50 cm MITRAL VALVE               TRICUSPID VALVE MV Area (PHT): 3.59 cm    TR Peak grad:   22.7 mmHg MV Area VTI:   1.58 cm    TR Vmax:        238.00 cm/s MV Peak grad:  3.3 mmHg MV Mean grad:  1.0 mmHg    SHUNTS MV Vmax:       0.90 m/s    Systemic VTI:  0.20 m MV Vmean:      46.8 cm/s   Systemic Diam: 1.90 cm MV Decel Time: 212 msec MV E velocity: 80.65 cm/s MV A velocity: 98.00 cm/s MV E/A ratio:  0.82 Mahesh Chandrasekhar  MD Electronically signed by Riley Lam MD Signature Date/Time: 12/31/2022/2:43:57 PM    Final    [place] EKG SITE RITE  Result Date: 12/31/2022 If Site Rite image not attached, placement could not be confirmed due to current cardiac rhythm.  EEG adult  Result Date: 12/31/2022 Charlsie Quest, MD     12/31/2022  1:43 PM Patient Name: LONNEL JARED MRN: [id number] Epilepsy Attending: Charlsie Quest Referring Physician/Provider: Caryl Pina, MD  Date: 12/31/2022 Duration: 23.45 mins Patient history:  77 year old male with history of dementia, BPH stroke with right-sided weakness, seizure disorder, hypertension, peripheral artery disease, diastolic CHF, carotid stenosis, bradycardia who presented with confusion. EEG to evaluate for seizure. Level of alertness: Awake AEDs during EEG study: LEV Technical aspects: This EEG study was done with scalp electrodes positioned according to the 10-20 International system of electrode placement. Electrical activity was reviewed with band pass filter of 1-70Hz , sensitivity of 7 uV/mm, display speed of 15mm/sec with a 60Hz  notched filter applied as appropriate. EEG data were recorded continuously and digitally stored.  Video monitoring was available and reviewed as appropriate. Description: The posterior dominant rhythm consists of 7 Hz activity of moderate voltage (25-35 uV) seen predominantly in posterior head regions, symmetric and reactive to eye opening and eye closing. EEG showed continuous generalized 5 to 6 Hz theta slowing. Hyperventilation and photic stimulation were not performed.   ABNORMALITY - Continuous slow, generalized - Background slow IMPRESSION: This study is suggestive of mild to moderate diffuse encephalopathy, nonspecific etiology. No seizures or epileptiform discharges were seen throughout the recording. Charlsie Quest   MR BRAIN WO CONTRAST  Result Date: 12/31/2022 CLINICAL DATA:  77 year old male with delirium. Family reports left-side is weaker for the past 4 days. History of previous strokes, on aspirin and Plavix. EXAM: MRI HEAD WITHOUT CONTRAST TECHNIQUE: Multiplanar, multiecho pulse sequences of the brain and surrounding structures were obtained without intravenous contrast. COMPARISON:  Head CT this morning.  Previous brain MRI 09/20/2020. FINDINGS: Brain: Acute lacunar infarct of the right lower brainstem. This is best seen on the axial diffusion tracking from the paracentral  pontomedullary junction on the right toward the right medullary pyramid. See series 3, images 10 and 11. There is associated FLAIR hyperintensity. Subtle if any T1 signal correlation. No hemorrhage or mass effect is associated. No other restricted diffusion. Chronic infarcts with encephalomalacia in the bilateral cerebellum, bilateral thalami, right basal ganglia, posterior right MCA territory, anterior left MCA territory. Some of the cortical encephalomalacia is new compared to 2022. Minimal associated hemosiderin on SWI. Patchy and confluent other cerebral white matter T2 and FLAIR hyperintensity also appears mildly progressed. Cavum septum pellucidum, normal variant. No midline shift, mass effect, evidence of mass lesion, ventriculomegaly, extra-axial collection or acute intracranial hemorrhage. Cervicomedullary junction and pituitary are within normal limits. Vascular: Major intracranial vascular flow voids are stable since 2022. Skull and upper cervical spine: Visualized bone marrow signal is within normal limits. Chronic cervical spine disc degeneration. Sinuses/Orbits: Stable chronic orbital wall fractures better demonstrated by CT this morning. Paranasal Visualized paranasal sinuses and mastoids are stable and well aerated. Other: Grossly normal visible internal auditory structures. Negative visible scalp and face. IMPRESSION: 1. Acute to subacute lacunar infarct of the Right lower brainstem - Pontomedullary junction and right medullary pyramid. No associated hemorrhage or mass effect. 2. Underlying fairly advanced chronic ischemic disease with other progression since a 2022 MRI. Electronically Signed   By: Odessa Fleming M.D.   On: 12/31/2022 10:17  CT HEAD WO CONTRAST ( )  Result Date: 12/31/2022 CLINICAL DATA:  77 year old male with delirium. EXAM: CT HEAD WITHOUT CONTRAST TECHNIQUE: Contiguous axial images were obtained from the base of the skull through the vertex without intravenous contrast. RADIATION  DOSE REDUCTION: This exam was performed according to the departmental dose-optimization program which includes automated exposure control, adjustment of the mA and/or kV according to patient size and/or use of iterative reconstruction technique. COMPARISON:  Brain MRI 09/20/2020.  Head CT 08/29/2022. FINDINGS: Brain: Cavum septum pellucidum, normal variant. No ventriculomegaly. No midline shift, mass effect, or evidence of intracranial mass lesion. No acute intracranial hemorrhage identified. Chronic encephalomalacia in the posterior right MCA territory with scattered small chronic infarcts in the bilateral thalami, bilateral cerebellum. Patchy and confluent bilateral cerebral white matter hypodensity. Stable gray-white matter differentiation throughout the brain. No cortically based acute infarct identified. Vascular: No suspicious intracranial vascular hyperdensity. Skull: Chronic lamina papyracea and left orbital floor fractures. No acute osseous abnormality identified. Sinuses/Orbits: Visualized paranasal sinuses and mastoids are stable and well aerated. Other: Chronic left orbital floor fracture. No acute orbit or scalp soft tissue finding identified. IMPRESSION: 1. No acute intracranial abnormality identified. Stable non contrast CT appearance of advanced chronic ischemic disease in the brain. 2. Chronic lamina papyracea and left orbital floor fractures. Electronically Signed   By: Odessa Fleming M.D.   On: 12/31/2022 05:10   DG Chest Portable 1 View  Result Date: 12/30/2022 CLINICAL DATA:  Lethargic Hypotension, lethargy and weakness x3 days. Seizure today. Hx of stroke, HTN. EXAM: PORTABLE CHEST 1 VIEW.  Patient is rotated. COMPARISON:  Chest x-ray 08/29/2022 FINDINGS: The heart and mediastinal contours are within normal limits. Aortic calcification. Developed retrocardiac airspace opacity. No pulmonary edema. No pleural effusion. No pneumothorax. No acute osseous abnormality. IMPRESSION: 1. Developing  retrocardiac airspace opacity with limited evaluation due to patient rotation. Recommend repeat PA and lateral view of the chest. 2.  Aortic Atherosclerosis (ICD10-I70.0). Electronically Signed   By: Tish Frederickson M.D.   On: 12/30/2022 19:50    Scheduled Meds:  sodium chloride   Intravenous Once   atorvastatin  40 mg Oral Daily   Chlorhexidine Gluconate Cloth  6 each Topical Q0600   citalopram  10 mg Oral Daily   feeding supplement  237 mL Oral TID BM   multivitamin with minerals  1 tablet Oral Daily   pantoprazole  40 mg Oral BID   potassium chloride  40 mEq Oral Q2H   QUEtiapine  50 mg Oral QHS   tamsulosin  0.4 mg Oral QPC supper   Continuous Infusions:  dextrose 5 % and 0.9 % NaCl 75 mL/hr at 01/01/23 0900   ferric gluconate (FERRLECIT) IVPB     levETIRAcetam 750 mg (01/01/23 1050)     LOS: 1 day   Burnadette Pop, MD [facility] P5/30/2024, 11:18 AM

## 2023-01-01 NOTE — Consult Note (Incomplete)
Neurology Consultation  Reason for Consult: Stroke on MRI brain  Referring Physician: Dr. Renford Dills   CC: Hypotension   History is obtained from:daughter at bedside and medical record   HPI: Andres White is a 77 y.o. male with past medical history of prior CVA with residual right side weakness on ASA and Plavix, dementia, seizures on Keppra, HTN, HLD, chronic diastolic HF, CAD, PAD, CKD, BPH and gout who presented to the ED for evaluation of 4 days of lethargy, hypotension and concerns for seizure like activity. Per daughter at the bedside he was noted to be non responsive with a left gaze, no twitching or jerking noted, lasting ~ 20 seconds and then returned to baseline.  MRI brain resulted with Acute to subacute lacunar infarct of the Right lower brainstem - Pontomedullary junction and right medullary pyramid. Neurology was curbsided last pm and patient was given 1gm Keppra IV and home dose Keprra increased to 750mg  BID prior to the MRI result being obtained. Routine EEG being done now. Neurology consulted for stroke workup   LKW: 4 days prior to admission  IV thrombolysis given?: no, outside window  EVT:  No LVO  Premorbid modified Rankin scale (mRS):  3-Moderate disability-requires help but walks WITHOUT assistance  ROS: Unable to obtain due to altered mental status.   Past Medical History:  Diagnosis Date  . Arthritis   . Chronic back pain   . Duodenal adenoma   . Gout   . Hypertension   . Stroke (HCC) 06/2020  . Syncope and collapse      Family History  Problem Relation Age of Onset  . Hypertension Mother   . Colon cancer Neg Hx   . Pancreatic cancer Neg Hx   . Stomach cancer Neg Hx   . Esophageal cancer Neg Hx   . Inflammatory bowel disease Neg Hx   . Liver disease Neg Hx   . Rectal cancer Neg Hx      Social History:   reports that he has been smoking cigarettes. He has a 10.00 pack-year smoking history. He has been exposed to tobacco smoke. He has never used  smokeless tobacco. He reports that he does not currently use alcohol. He reports that he does not use drugs.  Medications  Current Facility-Administered Medications:  .  0.9 %  sodium chloride infusion (Manually program via Guardrails IV Fluids), , Intravenous, Once, Doutova, Anastassia, MD .  acetaminophen (TYLENOL) tablet 650 mg, 650 mg, Oral, Q6H PRN **OR** acetaminophen (TYLENOL) suppository 650 mg, 650 mg, Rectal, Q6H PRN, Doutova, Anastassia, MD .  albuterol (PROVENTIL) (2.5 MG/3ML) 0.083% nebulizer solution 2.5 mg, 2.5 mg, Nebulization, Q2H PRN, Doutova, Anastassia, MD .  atorvastatin (LIPITOR) tablet 40 mg, 40 mg, Oral, Daily, Doutova, Anastassia, MD .  cefTRIAXone (ROCEPHIN) 1 g in sodium chloride 0.9 % 100 mL IVPB, 1 g, Intravenous, QHS, Doutova, Anastassia, MD, Stopped at 12/31/22 0429 .  Chlorhexidine Gluconate Cloth 2 % PADS 6 each, 6 each, Topical, Q0600, Adhikari, Amrit, MD .  citalopram (CELEXA) tablet 10 mg, 10 mg, Oral, Daily, Doutova, Anastassia, MD .  dextrose 5 %-0.9 % sodium chloride infusion, , Intravenous, Continuous, Adhikari, Amrit, MD .  HYDROcodone-acetaminophen (NORCO/VICODIN) 5-325 MG per tablet 1-2 tablet, 1-2 tablet, Oral, Q4H PRN, Doutova, Anastassia, MD .  levETIRAcetam (KEPPRA) 750 mg in sodium chloride 0.9 % 100 mL IVPB, 750 mg, Intravenous, Q12H, Adhikari, Amrit, MD, Last Rate: 430 mL/hr at 12/31/22 1101, 750 mg at 12/31/22 1101 .  ondansetron (ZOFRAN) tablet 4  mg, 4 mg, Oral, Q6H PRN **OR** ondansetron (ZOFRAN) injection 4 mg, 4 mg, Intravenous, Q6H PRN, Doutova, Anastassia, MD .  pantoprazole (PROTONIX) EC tablet 40 mg, 40 mg, Oral, BID, Doutova, Anastassia, MD .  QUEtiapine (SEROQUEL) tablet 50 mg, 50 mg, Oral, QHS, Doutova, Anastassia, MD .  tamsulosin (FLOMAX) capsule 0.4 mg, 0.4 mg, Oral, QPC supper, Doutova, Anastassia, MD .  traMADol (ULTRAM) tablet 50 mg, 50 mg, Oral, BID PRN, Therisa Doyne, MD   Exam: Current vital signs: BP (!) 122/52 (BP  Location: Right Arm)   Pulse (!) 56   Temp 99.1 F (37.3 C) (Axillary)   Resp 18   Ht 5\' 8"  (1.727 m)   Wt 74.8 kg   SpO2 99%   BMI 25.07 kg/m  Vital signs in last 24 hours: Temp:  [96.5 F (35.8 C)-99.1 F (37.3 C)] 99.1 F (37.3 C) (05/29 0854) Pulse Rate:  [53-77] 56 (05/29 0854) Resp:  [11-22] 18 (05/29 0854) BP: (96-143)/(52-86) 122/52 (05/29 0854) SpO2:  [88 %-100 %] 99 % (05/29 0359) Weight:  [74.8 kg-76.4 kg] 74.8 kg (05/29 0500)  GENERAL: Awake, alert in NAD HEENT: - Normocephalic and atraumatic, dry mm LUNGS - Clear to auscultation bilaterally with no wheezes CV - S1S2 RRR, no m/r/g, equal pulses bilaterally. ABDOMEN - Soft, nontender, nondistended with normoactive BS Ext: warm, well perfused, intact peripheral pulses, mild edema in bilateral feet   NEURO:  Mental Status: alert and oriented to self and hospital. Stated "July" for month, gave incorrect age and birth day.  Language: speech is clear.  Naming, repetition, fluency intact Cranial Nerves: PERRL EOMI, visual fields full, no facial asymmetry, facial sensation intact, hearing intact, tongue/uvula/soft palate midline, normal sternocleidomastoid and trapezius muscle strength. No evidence of tongue atrophy or fasciculations Motor: 4/5 left upper extremity Right upper and bilateral lower extremities 3/5 Tone: is normal and bulk is normal Sensation- Intact to light touch bilaterally Coordination: FTN intact bilaterally, no ataxia in BLE. Gait- deferred  NIHSS 1a Level of Conscious.: 1 1b LOC Questions: 2 1c LOC Commands: 0 2 Best Gaze: 0 3 Visual: 0 4 Facial Palsy: 0 5a Motor Arm - left: 0 5b Motor Arm - Right: 1 6a Motor Leg - Left: 1 6b Motor Leg - Right: 1 7 Limb Ataxia: 0 8 Sensory: 0 9 Best Language: 0 10 Dysarthria: 0 11 Extinct. and Inatten.: 0 TOTAL: 6   Labs I have reviewed labs in epic and the results pertinent to this consultation are:  CBC    Component Value Date/Time   WBC 5.3  12/30/2022 1846   RBC 2.66 (L) 12/30/2022 1846   HGB 5.5 (LL) 12/30/2022 1846   HGB 9.7 (L) 08/26/2022 1407   HCT 19.8 (L) 12/30/2022 1846   HCT 31.4 (L) 08/26/2022 1407   PLT 311 12/30/2022 1846   PLT 326 08/26/2022 1407   MCV 74.4 (L) 12/30/2022 1846   MCV 82 08/26/2022 1407   MCV 95 03/23/2014 2009   MCH 20.7 (L) 12/30/2022 1846   MCHC 27.8 (L) 12/30/2022 1846   RDW 19.9 (H) 12/30/2022 1846   RDW 16.1 (H) 08/26/2022 1407   RDW 14.1 03/23/2014 2009   LYMPHSABS 1.9 12/30/2022 1846   LYMPHSABS 2.9 03/23/2014 2009   MONOABS 0.3 12/30/2022 1846   MONOABS 0.5 03/23/2014 2009   EOSABS 0.0 12/30/2022 1846   EOSABS 0.1 03/23/2014 2009   BASOSABS 0.0 12/30/2022 1846   BASOSABS 0.0 03/23/2014 2009    CMP     Component Value  Date/Time   NA 135 12/30/2022 1846   NA 144 08/26/2022 1407   NA 134 (L) 03/23/2014 2009   K 2.2 (LL) 12/30/2022 1846   K 3.5 03/23/2014 2009   CL 104 12/30/2022 1846   CL 106 03/23/2014 2009   CO2 22 12/30/2022 1846   CO2 17 (L) 03/23/2014 2009   GLUCOSE 112 (H) 12/30/2022 1846   GLUCOSE 116 (H) 03/23/2014 2009   BUN 11 12/30/2022 1846   BUN 57 (H) 08/26/2022 1407   BUN 36 (H) 03/23/2014 2009   CREATININE 1.68 (H) 12/30/2022 1846   CREATININE 1.88 (H) 03/23/2014 2009   CALCIUM 7.8 (L) 12/30/2022 1846   CALCIUM 8.9 03/23/2014 2009   PROT 6.6 12/30/2022 1846   PROT 7.8 08/26/2022 1407   PROT 8.2 01/06/2014 1157   ALBUMIN 2.1 (L) 12/30/2022 1846   ALBUMIN 3.4 (L) 08/26/2022 1407   ALBUMIN 3.5 01/06/2014 1157   AST 35 12/30/2022 1846   AST 51 (H) 01/06/2014 1157   ALT 17 12/30/2022 1846   ALT 43 01/06/2014 1157   ALKPHOS 68 12/30/2022 1846   ALKPHOS 76 01/06/2014 1157   BILITOT 0.5 12/30/2022 1846   BILITOT 0.5 08/26/2022 1407   BILITOT 0.4 01/06/2014 1157   GFRNONAA 41 (L) 12/30/2022 1846   GFRNONAA 35 (L) 03/23/2014 2009   GFRAA 32 (L) 11/18/2017 1830   GFRAA 41 (L) 03/23/2014 2009    Lipid Panel     Component Value Date/Time   CHOL  104 12/10/2021 1123   TRIG 93.0 12/10/2021 1123   HDL 50.50 12/10/2021 1123   CHOLHDL 2 12/10/2021 1123   VLDL 18.6 12/10/2021 1123   LDLCALC 35 12/10/2021 1123   LDL 35 A1c ordered   Imaging I have reviewed the images obtained:  CT-head- no acute process, advanced chronic ischemic disease   MRI examination of the brain-Acute to subacute lacunar infarct of the Right lower brainstem - Pontomedullary junction and right medullary pyramid. advanced chronic ischemic disease   Assessment: 77 y.o. male with past medical history of prior CVA with residual right side weakness, dementia, seizures on Keppra, HTN, HLD, chronic diastolic HF, CAD, PAD, CKD, BPH and gout who presented to the ED for evaluation of 4 days of lethargy, hypotension and concerns for seizure like activity. - MRI revealed an acute/subacute Right Pontomedullary ischemic stroke likely due to small vessel disease  - Spot EEG: Continuous slow, generalized; background slow. This study is suggestive of mild to moderate diffuse encephalopathy, nonspecific etiology. No seizures or epileptiform discharges were seen throughout the recording. - Initially breakthrough seizures in the setting of acute illness were high on the DDx, but this is now felt to be less likely.   Recommendations: - HgbA1c - Frequent neuro checks - Echocardiogram- ordered  - CTA head and neck- ordered  - Bedside swallow screen. If passes may resume prior diet  - Hold antiplatelets in the setting of acute anemia. Resume when appropriate  - Risk factor modification - Telemetry monitoring - PT consult, OT consult, Speech consult - Stroke team to follow - continue Keppra 750mg  BID  - continue to treat infections per primary  - seizure and delirium precautions   Gevena Mart DNP, ACNPC-AG  [facility]

## 2023-01-02 DIAGNOSIS — R110 Nausea: Secondary | ICD-10-CM

## 2023-01-02 DIAGNOSIS — R195 Other fecal abnormalities: Secondary | ICD-10-CM

## 2023-01-02 DIAGNOSIS — G9340 Encephalopathy, unspecified: Secondary | ICD-10-CM | POA: Diagnosis not present

## 2023-01-02 DIAGNOSIS — D509 Iron deficiency anemia, unspecified: Secondary | ICD-10-CM | POA: Diagnosis not present

## 2023-01-02 DIAGNOSIS — G9341 Metabolic encephalopathy: Secondary | ICD-10-CM | POA: Diagnosis not present

## 2023-01-02 DIAGNOSIS — I499 Cardiac arrhythmia, unspecified: Secondary | ICD-10-CM | POA: Diagnosis not present

## 2023-01-02 LAB — BASIC METABOLIC PANEL
Anion gap: 9 (ref 5–15)
BUN: 8 mg/dL (ref 8–23)
CO2: 19 mmol/L — ABNORMAL LOW (ref 22–32)
Calcium: 7.7 mg/dL — ABNORMAL LOW (ref 8.9–10.3)
Chloride: 108 mmol/L (ref 98–111)
Creatinine, Ser: 1.22 mg/dL (ref 0.61–1.24)
GFR, Estimated: >60 mL/min (ref >60)
Glucose, Bld: 107 mg/dL — ABNORMAL HIGH (ref 70–99)
Potassium: 3.9 mmol/L (ref 3.5–5.1)
Sodium: 136 mmol/L (ref 135–145)

## 2023-01-02 LAB — MAGNESIUM: Magnesium: 1.7 mg/dL (ref 1.7–2.4)

## 2023-01-02 LAB — CBC
HCT: 25.6 % — ABNORMAL LOW (ref 39.0–52.0)
Hemoglobin: 7.7 g/dL — ABNORMAL LOW (ref 13.0–17.0)
MCH: 23 pg — ABNORMAL LOW (ref 26.0–34.0)
MCHC: 30.1 g/dL (ref 30.0–36.0)
MCV: 76.4 fL — ABNORMAL LOW (ref 80.0–100.0)
Platelets: 251 10*3/uL (ref 150–400)
RBC: 3.35 MIL/uL — ABNORMAL LOW (ref 4.22–5.81)
RDW: 20.8 % — ABNORMAL HIGH (ref 11.5–15.5)
WBC: 8.5 10*3/uL (ref 4.0–10.5)
nRBC: 0.2 % (ref 0.0–0.2)

## 2023-01-02 LAB — CULTURE, BLOOD (ROUTINE X 2): Culture: NO GROWTH

## 2023-01-02 LAB — PHOSPHORUS: Phosphorus: 2.1 mg/dL — ABNORMAL LOW (ref 2.5–4.6)

## 2023-01-02 MED ORDER — K PHOS MONO-SOD PHOS DI & MONO 155-852-130 MG PO TABS
500.0000 mg | ORAL_TABLET | Freq: Three times a day (TID) | ORAL | Status: DC
  Administered 2023-01-02 – 2023-01-03 (×4): 500 mg via ORAL
  Filled 2023-01-02 (×5): qty 2

## 2023-01-02 MED ORDER — SODIUM CHLORIDE 0.9% FLUSH
10.0000 mL | Freq: Two times a day (BID) | INTRAVENOUS | Status: DC
  Administered 2023-01-02: 20 mL
  Administered 2023-01-02 – 2023-01-03 (×2): 10 mL
  Administered 2023-01-03 – 2023-01-04 (×2): 20 mL
  Administered 2023-01-04 – 2023-01-05 (×2): 10 mL

## 2023-01-02 MED ORDER — SODIUM CHLORIDE 0.9% FLUSH: 10.0000 mL | INTRAVENOUS | Status: DC | PRN

## 2023-01-02 MED ORDER — BISACODYL 5 MG PO TBEC
10.0000 mg | DELAYED_RELEASE_TABLET | Freq: Once | ORAL | Status: AC
  Administered 2023-01-02: 10 mg via ORAL
  Filled 2023-01-02: qty 2

## 2023-01-02 NOTE — H&P (View-Only) (Signed)
   [facility] Progress Note  CC:  acute on chronic anemia   Subjective: Patient is awake with mild confusion.  He stated he was at a hospital in [place].  He complains of having mild nausea without vomiting.  No abdominal pain.  No chest pain or shortness of breath.  His RN reported that his appetite was less today and ate a few bites of applesauce and macaroni and cheese at lunchtime.  No bowel movement today.   Objective:  Vital signs in last 24 hours: Temp:  [98.1 F (36.7 C)-99.3 F (37.4 C)] 99.1 F (37.3 C) (05/31 1352) Pulse Rate:  [58-61] 60 (05/31 1352) Resp:  [16-18] 17 (05/31 1352) BP: (122-178)/(65-80) 127/74 (05/31 1352) SpO2:  [95 %-100 %] 99 % (05/31 1352) Last BM Date : 12/30/22 General: Chronically ill fatigued appearing 77-year-old male in no acute distress. Heart: Regular rate and rhythm, no murmurs. Pulm: Breath sounds clear, decreased in the bases. Abdomen: Soft, mildly distended but soft.  Nontender.  Positive bowel sounds to all 4 quadrants. Extremities: Moderate bilateral lower extremity edema.  Bilateral padded boots in use. Neurologic:  Alert and oriented to name. Speech is softly spoken but clear. Moves all extremities weakly.  Psych:  Alert and cooperative. Normal mood and affect.  Intake/Output from previous day: 05/30 0701 - 05/31 0700 In: 1349.7 [P.O.:237; I.V.:724.5; IV Piggyback:388.2] Out: 700 [Urine:700] Intake/Output this shift: No intake/output data recorded.  Lab Results: Recent Labs    12/31/22 1201 01/01/23 0832 01/02/23 1205  WBC 5.3 6.3 8.5  HGB 9.9* 8.2* 7.7*  HCT 31.8* 25.9* 25.6*  PLT 23* 266 251   BMET Recent Labs    12/31/22 1201 01/01/23 0832 01/02/23 0418  NA 136 136 136  K 3.6 2.7* 3.9  CL 107 110 108  CO2 20* 19* 19*  GLUCOSE 85 100* 107*  BUN 10 8 8  CREATININE 1.35* 1.16 1.22  CALCIUM 7.9* 7.5* 7.7*   LFT Recent Labs    12/31/22 1201  PROT 6.8  ALBUMIN 2.3*  AST 40  ALT 14   ALKPHOS 70  BILITOT 0.6   PT/INR Recent Labs    12/30/22 1846  LABPROT 15.1  INR 1.2   Hepatitis Panel No results for input(s): "HEPBSAG", "HCVAB", "HEPAIGM", "HEPBIGM" in the last 72 hours.  CT ANGIO HEAD NECK W WO CM  Result Date: 01/01/2023 CLINICAL DATA:  Stroke, follow-up. EXAM: CT ANGIOGRAPHY HEAD AND NECK WITH AND WITHOUT CONTRAST TECHNIQUE: Precontrast CT of the head. Subsequently, multidetector CT imaging of the head and neck was performed using the standard protocol during bolus administration of intravenous contrast. Multiplanar CT image reconstructions and MIPs were obtained to evaluate the vascular anatomy. Carotid stenosis measurements (when applicable) are obtained utilizing NASCET criteria, using the distal internal carotid diameter as the denominator. RADIATION DOSE REDUCTION: This exam was performed according to the departmental dose-optimization program which includes automated exposure control, adjustment of the mA and/or kV according to patient size and/or use of iterative reconstruction technique. CONTRAST:  75mL OMNIPAQUE IOHEXOL 350 MG/ML SOLN COMPARISON:  MRI brain 12/31/2022.  CTA head/neck 09/16/2020. FINDINGS: CT HEAD FINDINGS Brain: No acute hemorrhage. Unchanged severe chronic small-vessel disease with old infarcts in the left frontal operculum, bilateral parietal lobes and left cerebellar hemisphere. The acute lacunar infarct seen on previous day's brain MRI is not resolved by CT. No hydrocephalus or extra-axial collection. Vascular: No hyperdense vessel or unexpected calcification. Skull: No calvarial fracture or suspicious bone lesion. Skull base is   unremarkable. Sinuses/Orbits: Unremarkable. Other: None. Review of the MIP images confirms the above findings CTA NECK FINDINGS Aortic arch: Three-vessel arch configuration. Atherosclerotic calcifications of the aortic arch and arch vessel origins. Arch vessel origins are patent. Right carotid system: No evidence of  dissection, stenosis (50% or greater), or occlusion. Extensive, circumferential calcified plaque of the right carotid bulb and proximal right cervical ICA. Left carotid system: Approximately 80% stenosis of the proximal left cervical ICA (image 73 series 10), progressed from February 2022. Vertebral arteries: Left dominant. Hypoplastic right vertebral artery is not visualized from its origin to the distal V2 segment where it reconstitutes via collateral flow. Severe stenosis of the left vertebral artery at its origin. Skeleton: Multilevel cervical spondylosis, worst at C5-6, where there is at least moderate spinal canal stenosis. Other neck: Unremarkable. Upper chest: Unremarkable. Review of the MIP images confirms the above findings CTA HEAD FINDINGS Anterior circulation: Calcified plaque along the carotid siphons without hemodynamically significant stenosis. The ACAs and MCAs are patent proximally. 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 Distal branches are symmetric. Posterior circulation: Normal basilar artery. The SCAs, AICAs and PICAs are patent proximally. The PCAs are patent proximally without stenosis or aneurysm. Distal branches are symmetric. Venous sinuses: As permitted by contrast timing, patent. Anatomic variants: Persistent fetal origin of the bilateral PCAs with hypoplastic P1 segments. Review of the MIP images confirms the above findings IMPRESSION: 1. Approximately 80% stenosis of the proximal left cervical ICA, progressed from February 2022. 2. Hypoplastic right vertebral artery is not visualized from its origin to the distal V2 segment where it reconstitutes via collateral flow. Severe stenosis of the left vertebral artery at its origin. 3. No intracranial large vessel occlusion or hemodynamically significant stenosis. 4. 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 Aortic Atherosclerosis (ICD10-I70.0). Electronically Signed   By: Walter  Wiggins M.D.   On: 01/01/2023 18:29     Patient profile:  77 year old male with medical history significant for dementia, CVA with residual right sided weakness, seizures, bradycardia, CHF, carotid stenosis, CKD, duodenal adenoma, NSVT admitted 12/30/2022 with AKI, severe electrolye disturbances, acute on chronic microcytic anemia after family brought patient to ED with chief complaint of increased lethargy and confusion.    Assessment / Plan:  Acute on chronic anemia; history of duodenal adenomas. Admission Hg 5.5 transfused 2 units of PRBCs -> Hg 9.9 -> 8.2 -> today Hg 7.7. Iron 27, TIBC 242, Sat 11%. Ferritin 31. B12 > 7,5000. FOBT positive. Received IV iron.  -Enteroscopy +/- VCE after Plavix wash out, possibly Saturday or Sunday. I called his daughter/POA, Heather Womack" and discussed benefits and risks discussed including risk with sedation, risk of bleeding, perforation and infection. Heather provided consent for enteroscopy and VCE. Timing to be determined, await further recommendations per Dr. Mansouraty  -CBC in am -Transfuse for Hg < 8  -Continue to monitor the patient closely for active GI bleeding -Continue PPI po bid   Acute metabolic encephalopathy in setting of dementia with acute/subacute CVA. -CT head shows advanced chronic ischemic disease, no acute abnormality. - MRI showed acute to subacute lacunar infarct of the right lower brainstem, pontomedullary junction and right medullary pyramid. Holding anticoagulation/antiplatelet in setting of GI bleed. -CTA Neck showed approximately 80% stenosis of the proximal left cervical ICA, progressed from February 2022. - Neurology recommended resuming either aspirin or Plavix monotherapy once anemia improves and cleared by GI. - Last dose plavix 5/28 AM   Nausea -Continue PPI bid -Zofran 4mg po or IV   Q 6hrs as needed -Consider abdominal sonogram if nausea persists   Arrhythmia  - Evaluated by cardiology. Found to have rare PVCs and baseline bradycardia, suspect  arrhythmia is secondary to metabolic abnormalities   AKI, resolved  Sars coronavirus 19 positive on room air. Not on steroids or antiviral treatment.      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    

## 2023-01-02 NOTE — Progress Notes (Signed)
PROGRESS NOTE  Andres White  WUJ:[id number] DOB: Mar 12, 1946 DOA: 12/30/2022 PCP: Hildred Priest, FNP   Brief Narrative: Patient is a 77 year old male with history of dementia, BPH stroke with right-sided weakness, seizure disorder, hypertension, peripheral artery disease, diastolic CHF, carotid stenosis, bradycardia who presented with confusion .  As per the report, patient was lethargic for 4 days.  On presentation he was bradycardic, hypotensive.  He was recently treated for UTI with Levaquin.  Lab work on presentation showed potassium of 2.2, hemoglobin in the range of 5.  MRI showed acute to subacute ischemic stroke.  GI, cardiology, neurology, palliative care consulted.GI planning for endoscopies +/- video capsule endoscopy on the weekend, awaiting Plavix washout.  PT/OT recommended SNF on discharge  Assessment & Plan:  Principal Problem:   Acute encephalopathy Active Problems:   Left carotid stenosis   Stroke-like symptoms   Acute blood loss anemia   Acute renal failure superimposed on stage 3b chronic kidney disease (HCC)   Hypertension   CKD (chronic kidney disease) stage 3, GFR 30-59 ml/min (HCC)   Duodenal adenoma   History of seizure disorder   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   Hypokalemia   Acute metabolic encephalopathy   UTI (urinary tract infection)   Hypomagnesemia   Frequent PVCs   Prolonged QT interval   Acute urinary retention   COVID-19 virus infection   Sinus bradycardia   Right foot ulcer (HCC)   Acute ischemic stroke (HCC)   Antiplatelet or antithrombotic long-term use   Acute on chronic anemia    Acute encephalopathy: History of known dementia.  More confused from baseline.  Lethargic for 4 days.  Multifactorial but likely from stroke.Currently he is oriented to place only .  Obeys commands.  Has generalized weakness.PT/OT recommending SNF on discharge  Acute/Subacute stroke: MRI showed  acute to subacute lacunar infarct of the Right lower  brainstem :Pontomedullary junction and right medullary pyramid.  [name]  Has history of stroke.  Ambulates with walker/wheelchair.  PT/OT/speech evaluation.  Initiated stroke workup.Not a candidate for antiplatelet or anticoagulation at the moment due to possible GI bleed.  Neurology following.  Neurology recommending antiplatelet monotherapy after we address GI bleed  H/O seizure.  Seizure could not be ruled out.  Spot EEG did not show any seizure or epileptiform activity.   Neurology recommended continue home Keppra  Severe anemia/GI bleed: Hemoglobin in the range of 5 on presentation.  Transfused with 2 units of PRBC.  FOBT positive.  GI consulted .  Monitor H&H. Patient has history of chronic gastritis.No hematochezia or melena as per the daughter.  GI planning for enteroscopy /video   capsule endoscopy. Iron studies showed low iron,  given IV iron  Thrombocytopenia: Noted on 5/29.  Platelets level on 5/30  was normal.Lab error?  PICC line placement due to difficult IV access  AKI on CKD stage IIIb: Foley catheter placement at Emergency Department for urinary retention.  Presented with creatinine of 1.6, kidney function normalised.  Will give voiding trial when appropriate  Frequent PVCs/bradycardia/prolonged QTc: Cardiology consulted.  Has chronic sinus bradycardia.  Cardiology signed off Patient has prolonged QT.  Monitor on telemetry.  TSH normal.  Will check follow-up EKG  Covid: This could also  have attributed to weakness.  Currently on room air.  Continue supportive care.  Not a started on steroids or antivirals.  Right foot ulcer: appears to be noninfected.  Wound care consulted and following  Goals of care: Patient has multiple comorbidities.  Has  very poor oral intake.  Due to his advanced age and poor prognosis, he might be a candidate for hospice.  Palliative care following discussed with family.  Family wants to keep him full code and continue full scope of treatment  Nutrition  Problem: Increased nutrient needs Etiology: catabolic illness (HF, COVID, increased needs for wound healing)    DVT prophylaxis:SCDs Start: 12/30/22 2309     Code Status: Full Code  Family Communication: Called and discussed with daughter on phone on 5/30  Patient status:Inpatient  Patient is from :Home  Anticipated discharge to: SnF  Estimated DC date:not sure, awaiting GI workup   Consultants: Cardiology,GI, neurology  Procedures:None  Antimicrobials:  Anti-infectives (From admission, onward)    Start     Dose/Rate Route Frequency Ordered Stop   12/30/22 2345  cefTRIAXone (ROCEPHIN) 1 g in sodium chloride 0.9 % 100 mL IVPB  Status:  Discontinued        1 g 200 mL/hr over 30 Minutes Intravenous Daily at bedtime 12/30/22 2317 01/01/23 0928       Subjective: Patient seen and examined at bedside today.  Hemodynamically stable lying in bed.  Oriented to place only.  Looks weak and deconditioned.  Not in any current distress.  Denies any new complaints  Objective: Vitals:   01/01/23 1651 01/01/23 1943 01/01/23 2332 01/02/23 0323  BP: (!) 178/78 (!) 149/80 132/80 122/65  Pulse: (!) 59 (!) 59 (!) 58 61  Resp: 18 18 18 16   Temp: 98.1 F (36.7 C)  98.1 F (36.7 C) 99.3 F (37.4 C)  TempSrc: Oral  Oral Oral  SpO2: 100% 95% 96% 99%  Weight:      Height:        Intake/Output Summary (Last 24 hours) at 01/02/2023 1135 Last data filed at 01/01/2023 2152 Gross per 24 hour  Intake 737.62 ml  Output 700 ml  Net 37.62 ml   Filed Weights   12/30/22 2215 12/31/22 0500 01/01/23 0427  Weight: 76.4 kg 74.8 kg 76 kg    Examination:  General exam: Overall comfortable, , not in distress, appears weak, chronically ill looking HEENT: PERRL Respiratory system:  no wheezes or crackles  Cardiovascular system: S1 & S2 heard, RRR.  Gastrointestinal system: Abdomen is nondistended, soft and nontender. Central nervous system: Alert and oriented to place only, obeys commands,  generalized weakness Extremities: Trace lower extremity edema, no clubbing ,no cyanosis Skin: Right heel ulcer   Data Reviewed: I have personally reviewed following labs and imaging studies  CBC: Recent Labs  Lab 12/30/22 1846 12/31/22 1201 01/01/23 0832  WBC 5.3 5.3 6.3  NEUTROABS 3.0  --   --   HGB 5.5* 9.9* 8.2*  HCT 19.8* 31.8* 25.9*  MCV 74.4* 76.8* 76.9*  PLT 311 23* 266   Basic Metabolic Panel: Recent Labs  Lab 12/30/22 1846 12/31/22 1201 01/01/23 0832 01/02/23 0418  NA 135 136 136 136  K 2.2* 3.6 2.7* 3.9  CL 104 107 110 108  CO2 22 20* 19* 19*  GLUCOSE 112* 85 100* 107*  BUN 11 10 8 8   CREATININE 1.68* 1.35* 1.16 1.22  CALCIUM 7.8* 7.9* 7.5* 7.7*  MG 1.4* 1.9 1.8 1.7  PHOS  --  2.4* 2.8 2.1*     Recent Results (from the past 240 hour(s))  Culture, blood (Routine x 2)     Status: None (Preliminary result)   Collection Time: 12/30/22  6:46 PM   Specimen: BLOOD  Result Value Ref Range Status  Specimen Description BLOOD RIGHT ANTECUBITAL  Final   Special Requests   Final    BOTTLES DRAWN AEROBIC AND ANAEROBIC Blood Culture results may not be optimal due to an inadequate volume of blood received in culture bottles   Culture   Final    NO GROWTH 3 DAYS Performed at [facility], [address], [place], [place] [address]    Report Status PENDING  Incomplete  Urine Culture     Status: Abnormal   Collection Time: 12/30/22  9:06 PM   Specimen: Urine, Random  Result Value Ref Range Status   Specimen Description URINE, RANDOM  Final   Special Requests NONE Reflexed from [id number]  Final   Culture (A)  Final    <10,000 COLONIES/mL INSIGNIFICANT GROWTH Performed at [facility], [address], [place], [place] [address]    Report Status 12/31/2022 FINAL  Final  SARS Coronavirus 2 by RT PCR (hospital order, performed in [facility] lab) *cepheid single result test* Anterior Nasal Swab     Status: Abnormal   Collection Time: 12/30/22   9:17 PM   Specimen: Anterior Nasal Swab  Result Value Ref Range Status   SARS Coronavirus 2 by RT PCR POSITIVE (A) NEGATIVE Final    Comment: Performed at [facility], [address] [place], [place] [address]  Culture, blood (Routine X 2) w Reflex to ID Panel     Status: None (Preliminary result)   Collection Time: 12/31/22  8:07 AM   Specimen: BLOOD LEFT HAND  Result Value Ref Range Status   Specimen Description BLOOD LEFT HAND  Final   Special Requests   Final    BOTTLES DRAWN AEROBIC ONLY Blood Culture results may not be optimal due to an inadequate volume of blood received in culture bottles   Culture   Final    NO GROWTH 2 DAYS Performed at [facility], [address] [place], [place] [address]    Report Status PENDING  Incomplete     Radiology Studies: CT ANGIO HEAD NECK W WO CM  Result Date: 01/01/2023 CLINICAL DATA:  Stroke, follow-up. EXAM: CT ANGIOGRAPHY HEAD AND NECK WITH AND WITHOUT CONTRAST TECHNIQUE: Precontrast CT of the head. Subsequently, multidetector CT imaging of the head and neck was performed using the standard protocol during bolus administration of intravenous contrast. Multiplanar CT image reconstructions and MIPs were obtained to evaluate the vascular anatomy. Carotid stenosis measurements (when applicable) are obtained utilizing NASCET criteria, using the distal internal carotid diameter as the denominator. RADIATION DOSE REDUCTION: This exam was performed according to the departmental dose-optimization program which includes automated exposure control, adjustment of the mA and/or kV according to patient size and/or use of iterative reconstruction technique. CONTRAST:  75mL OMNIPAQUE IOHEXOL 350 MG/ML SOLN COMPARISON:  MRI brain 12/31/2022.  CTA head/neck 09/16/2020. FINDINGS: CT HEAD FINDINGS Brain: No acute hemorrhage. Unchanged severe chronic small-vessel disease with old infarcts in the left frontal operculum, bilateral parietal lobes and left  cerebellar hemisphere. The acute lacunar infarct seen on previous day's brain MRI is not resolved by CT. No hydrocephalus or extra-axial collection. Vascular: No hyperdense vessel or unexpected calcification. Skull: No calvarial fracture or suspicious bone lesion. Skull base is unremarkable. Sinuses/Orbits: Unremarkable. Other: None. Review of the MIP images confirms the above findings CTA NECK FINDINGS Aortic arch: Three-vessel arch configuration. Atherosclerotic calcifications of the aortic arch and arch vessel origins. Arch vessel origins are patent. Right carotid system: No evidence of dissection,  stenosis (50% or greater), or occlusion. Extensive, circumferential calcified plaque of the right carotid bulb and proximal right cervical ICA. Left carotid system: Approximately 80% stenosis of the proximal left cervical ICA (image 73 series 10), progressed from February 2022. Vertebral arteries: Left dominant. Hypoplastic right vertebral artery is not visualized from its origin to the distal V2 segment where it reconstitutes via collateral flow. Severe stenosis of the left vertebral artery at its origin. Skeleton: Multilevel cervical spondylosis, worst at C5-6, where there is at least moderate spinal canal stenosis. Other neck: Unremarkable. Upper chest: Unremarkable. Review of the MIP images confirms the above findings CTA HEAD FINDINGS Anterior circulation: Calcified plaque along the carotid siphons without hemodynamically significant stenosis. The ACAs and MCAs are patent proximally. 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 Distal branches are symmetric. Posterior circulation: Normal basilar artery. The SCAs, AICAs and PICAs are patent proximally. The PCAs are patent proximally without stenosis or aneurysm. Distal branches are symmetric. Venous sinuses: As permitted by contrast timing, patent. Anatomic variants: Persistent fetal origin of the bilateral PCAs with hypoplastic P1 segments. Review of the  MIP images confirms the above findings IMPRESSION: 1. Approximately 80% stenosis of the proximal left cervical ICA, progressed from February 2022. 2. Hypoplastic right vertebral artery is not visualized from its origin to the distal V2 segment where it reconstitutes via collateral flow. Severe stenosis of the left vertebral artery at its origin. 3. No intracranial large vessel occlusion or hemodynamically significant stenosis. 4. 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 Aortic Atherosclerosis (ICD10-I70.0). Electronically Signed   By: Orvan Falconer M.D.   On: 01/01/2023 18:29   VAS [place] LOWER EXTREMITY VENOUS (DVT)  Result Date: 12/31/2022  Lower Venous DVT Study Patient Name:  Andres White  Date of Exam:   12/31/2022 Medical Rec #: [id number]       Accession #:    [id number] Date of Birth: 1946/05/24       Patient Gender: M Patient Age:   37 years Exam Location:  [facility] Procedure:      VAS [place] LOWER EXTREMITY VENOUS (DVT) Referring Phys: Jonny Ruiz DOUTOVA --------------------------------------------------------------------------------  Indications: Swelling.  Risk Factors: COVID 19 positive. Comparison Study: No prior studies. Performing Technologist: Chanda Busing RVT  Examination Guidelines: A complete evaluation includes B-mode imaging, spectral Doppler, color Doppler, and power Doppler as needed of all accessible portions of each vessel. Bilateral testing is considered an integral part of a complete examination. Limited examinations for reoccurring indications may be performed as noted. The reflux portion of the exam is performed with the patient in reverse Trendelenburg.  +-----+---------------+---------+-----------+----------+--------------+ RIGHTCompressibilityPhasicitySpontaneityPropertiesThrombus Aging +-----+---------------+---------+-----------+----------+--------------+ CFV  Full           Yes      Yes                                  +-----+---------------+---------+-----------+----------+--------------+   +---------+---------------+---------+-----------+----------+--------------+ LEFT     CompressibilityPhasicitySpontaneityPropertiesThrombus Aging +---------+---------------+---------+-----------+----------+--------------+ CFV      Full           Yes      Yes                                 +---------+---------------+---------+-----------+----------+--------------+ SFJ      Full                                                        +---------+---------------+---------+-----------+----------+--------------+  FV Prox  Full                                                        +---------+---------------+---------+-----------+----------+--------------+ FV Mid   Full                                                        +---------+---------------+---------+-----------+----------+--------------+ FV DistalFull                                                        +---------+---------------+---------+-----------+----------+--------------+ PFV      Full                                                        +---------+---------------+---------+-----------+----------+--------------+ POP      Full           Yes      Yes                                 +---------+---------------+---------+-----------+----------+--------------+ PTV      Full                                                        +---------+---------------+---------+-----------+----------+--------------+ PERO     Full                                                        +---------+---------------+---------+-----------+----------+--------------+    Summary: RIGHT: - No evidence of common femoral vein obstruction.  LEFT: - There is no evidence of deep vein thrombosis in the lower extremity.  - No cystic structure found in the popliteal fossa.  *See table(s) above for measurements and observations. Electronically signed  by Coral Else MD on 12/31/2022 at 5:04:29 PM.    Final    ECHOCARDIOGRAM COMPLETE  Result Date: 12/31/2022    ECHOCARDIOGRAM REPORT   Patient Name:   Andres White Date of Exam: 12/31/2022 Medical Rec #:  [id number]      Height:       68.0 in Accession #:    [id number]     Weight:       164.9 lb Date of Birth:  September 23, 1945      BSA:          1.883 m Patient Age:    77 years       BP:           122/52 mmHg Patient  Gender: M              HR:           50 bpm. Exam Location:  Inpatient Procedure: 2D Echo, Cardiac Doppler and Color Doppler Indications:    Stroke  History:        Patient has prior history of Echocardiogram examinations, most                 recent 06/25/2020. CHF, Carotid Disease, CKD, Stroke and PAD,                 Arrythmias:PVC and Bradycardia; Risk Factors:Hypertension and                 Current Smoker.  Sonographer:    Wallie Char Referring Phys: [id number] ANASTASSIA DOUTOVA IMPRESSIONS  1. Left ventricular ejection fraction, by estimation, is 60 to 65%. The left ventricle has normal function. The left ventricle has no regional wall motion abnormalities. There is moderate asymmetric left ventricular hypertrophy of the inferior segment. Left ventricular diastolic parameters are indeterminate.  2. Right ventricular systolic function is normal. The right ventricular size is normal.  3. The mitral valve is grossly normal. No evidence of mitral valve regurgitation. No evidence of mitral stenosis.  4. The aortic valve is tricuspid. There is mild calcification of the aortic valve. Aortic valve regurgitation is not visualized. Aortic valve sclerosis is present, with no evidence of aortic valve stenosis. Comparison(s): No significant change from prior study. FINDINGS  Left Ventricle: Left ventricular ejection fraction, by estimation, is 60 to 65%. The left ventricle has normal function. The left ventricle has no regional wall motion abnormalities. The left ventricular internal cavity size was normal in  size. There is  moderate asymmetric left ventricular hypertrophy of the inferior segment. Left ventricular diastolic parameters are indeterminate. Right Ventricle: The right ventricular size is normal. No increase in right ventricular wall thickness. Right ventricular systolic function is normal. Left Atrium: Left atrial size was normal in size. Right Atrium: Right atrial size was normal in size. Pericardium: Trivial pericardial effusion is present. The pericardial effusion is circumferential. Mitral Valve: The mitral valve is grossly normal. No evidence of mitral valve regurgitation. No evidence of mitral valve stenosis. MV peak gradient, 3.3 mmHg. The mean mitral valve gradient is 1.0 mmHg. Tricuspid Valve: The tricuspid valve is normal in structure. Tricuspid valve regurgitation is not demonstrated. No evidence of tricuspid stenosis. Aortic Valve: The aortic valve is tricuspid. There is mild calcification of the aortic valve. There is mild aortic valve annular calcification. Aortic valve regurgitation is not visualized. Aortic valve sclerosis is present, with no evidence of aortic valve stenosis. Aortic valve mean gradient measures 4.0 mmHg. Aortic valve peak gradient measures 5.9 mmHg. Aortic valve area, by VTI measures 2.01 cm. Pulmonic Valve: The pulmonic valve was normal in structure. Pulmonic valve regurgitation is not visualized. No evidence of pulmonic stenosis. Aorta: The aortic root and ascending aorta are structurally normal, with no evidence of dilitation. IAS/Shunts: No atrial level shunt detected by color flow Doppler.  LEFT VENTRICLE PLAX 2D LVIDd:         4.80 cm     Diastology LVIDs:         3.10 cm     LV e' medial:    7.19 cm/s LV PW:         1.30 cm     LV E/e' medial:  11.2 LV IVS:        1.10  cm     LV e' lateral:   6.65 cm/s LVOT diam:     1.90 cm     LV E/e' lateral: 12.1 LV SV:         57 LV SV Index:   30 LVOT Area:     2.84 cm  LV Volumes (MOD) LV vol d, MOD A2C: 54.6 ml LV vol d, MOD  A4C: 84.4 ml LV vol s, MOD A2C: 23.7 ml LV vol s, MOD A4C: 35.8 ml LV SV MOD A2C:     30.9 ml LV SV MOD A4C:     84.4 ml LV SV MOD BP:      42.2 ml RIGHT VENTRICLE RV Basal diam:  3.00 cm RV S prime:     11.40 cm/s TAPSE (M-mode): 2.3 cm LEFT ATRIUM             Index        RIGHT ATRIUM           Index LA diam:        3.70 cm 1.97 cm/m   RA Area:     12.60 cm LA Vol (A2C):   41.0 ml 21.78 ml/m  RA Volume:   24.20 ml  12.85 ml/m LA Vol (A4C):   45.3 ml 24.06 ml/m LA Biplane Vol: 43.4 ml 23.05 ml/m  AORTIC VALVE AV Area (Vmax):    2.08 cm AV Area (Vmean):   1.98 cm AV Area (VTI):     2.01 cm AV Vmax:           121.50 cm/s AV Vmean:          89.050 cm/s AV VTI:            0.283 m AV Peak Grad:      5.9 mmHg AV Mean Grad:      4.0 mmHg LVOT Vmax:         89.10 cm/s LVOT Vmean:        62.200 cm/s LVOT VTI:          0.201 m LVOT/AV VTI ratio: 0.71  AORTA Ao Root diam: 3.40 cm Ao Asc diam:  3.50 cm MITRAL VALVE               TRICUSPID VALVE MV Area (PHT): 3.59 cm    TR Peak grad:   22.7 mmHg MV Area VTI:   1.58 cm    TR Vmax:        238.00 cm/s MV Peak grad:  3.3 mmHg MV Mean grad:  1.0 mmHg    SHUNTS MV Vmax:       0.90 m/s    Systemic VTI:  0.20 m MV Vmean:      46.8 cm/s   Systemic Diam: 1.90 cm MV Decel Time: 212 msec MV E velocity: 80.65 cm/s MV A velocity: 98.00 cm/s MV E/A ratio:  0.82 Riley Lam MD Electronically signed by Riley Lam MD Signature Date/Time: 12/31/2022/2:43:57 PM    Final    [place] EKG SITE RITE  Result Date: 12/31/2022 If Site Rite image not attached, placement could not be confirmed due to current cardiac rhythm.  EEG adult  Result Date: 12/31/2022 Charlsie Quest, MD     12/31/2022  1:43 PM Patient Name: Andres White MRN: [id number] Epilepsy Attending: Charlsie Quest Referring Physician/Provider: Caryl Pina, MD Date: 12/31/2022 Duration: 23.45 mins Patient history:  77 year old male with history of dementia, BPH stroke with right-sided weakness, seizure  disorder, hypertension, peripheral  artery disease, diastolic CHF, carotid stenosis, bradycardia who presented with confusion. EEG to evaluate for seizure. Level of alertness: Awake AEDs during EEG study: LEV Technical aspects: This EEG study was done with scalp electrodes positioned according to the 10-20 International system of electrode placement. Electrical activity was reviewed with band pass filter of 1-70Hz , sensitivity of 7 uV/mm, display speed of 54mm/sec with a 60Hz  notched filter applied as appropriate. EEG data were recorded continuously and digitally stored.  Video monitoring was available and reviewed as appropriate. Description: The posterior dominant rhythm consists of 7 Hz activity of moderate voltage (25-35 uV) seen predominantly in posterior head regions, symmetric and reactive to eye opening and eye closing. EEG showed continuous generalized 5 to 6 Hz theta slowing. Hyperventilation and photic stimulation were not performed.   ABNORMALITY - Continuous slow, generalized - Background slow IMPRESSION: This study is suggestive of mild to moderate diffuse encephalopathy, nonspecific etiology. No seizures or epileptiform discharges were seen throughout the recording. Priyanka Annabelle Harman    Scheduled Meds:  sodium chloride   Intravenous Once   atorvastatin  40 mg Oral Daily   Chlorhexidine Gluconate Cloth  6 each Topical Q0600   citalopram  10 mg Oral Daily   feeding supplement  237 mL Oral TID BM   levETIRAcetam  500 mg Oral BID   multivitamin with minerals  1 tablet Oral Daily   pantoprazole  40 mg Oral BID   phosphorus  500 mg Oral TID   QUEtiapine  50 mg Oral QHS   sodium chloride flush  10-40 mL Intracatheter Q12H   tamsulosin  0.4 mg Oral QPC supper   Continuous Infusions:  ferric gluconate (FERRLECIT) IVPB 250 mg (01/02/23 1125)     LOS: 2 days   Burnadette Pop, MD [facility] P5/31/2024, 11:35 AM

## 2023-01-02 NOTE — TOC Progression Note (Signed)
Transition of Care [facility]) - Progression Note    Patient Details  Name: Andres White MRN: [id number] Date of Birth: 07-Apr-1946  Transition of Care [facility]) CM/SW Contact  Baldemar Lenis, [place] Phone Number: 01/02/2023, 3:31 PM  Clinical Narrative:   CSW spoke with daughter, Herbert Seta, about recommendation for SNF placement. Heather in agreement to at least looking at the facility options, she doesn't think that there are great options around the [facility] area. She does not want to go very far. CSW received permission to fax out referral. CSW discussed likely needing to wait until patient clears isolation prior to SNF placement, and Heather indicated understanding. CSW to follow.    Expected Discharge Plan: Skilled Nursing Facility Barriers to Discharge: Continued Medical Work up, English as a second language teacher, SNF Covid  Expected Discharge Plan and Services In-house Referral: Hospice / Palliative Care Discharge Planning Services: CM Consult   Living arrangements for the past 2 months: Single Family Home                 DME Arranged: N/A DME Agency: NA                   Social Determinants of Health (SDOH) Interventions SDOH Screenings   Food Insecurity: No Food Insecurity (01/01/2023)  Housing: Low Risk  (01/01/2023)  Transportation Needs: No Transportation Needs (01/01/2023)  Utilities: Not At Risk (01/01/2023)  Alcohol Screen: Low Risk  (03/31/2022)  Depression (PHQ2-9): Low Risk  (04/15/2022)  Financial Resource Strain: Low Risk  (03/31/2022)  Physical Activity: Insufficiently Active (10/21/2022)  Social Connections: Socially Isolated (03/31/2022)  Stress: Stress Concern Present (10/21/2022)  Tobacco Use: High Risk (12/30/2022)    Readmission Risk Interventions     No data to display

## 2023-01-02 NOTE — Progress Notes (Signed)
Peripherally Inserted Central Catheter Placement  The IV Nurse has discussed with the patient and/or persons authorized to consent for the patient, the purpose of this procedure and the potential benefits and risks involved with this procedure.  The benefits include less needle sticks, lab draws from the catheter, and the patient may be discharged home with the catheter. Risks include, but not limited to, infection, bleeding, blood clot (thrombus formation), and puncture of an artery; nerve damage and irregular heartbeat and possibility to perform a PICC exchange if needed/ordered by physician.  Alternatives to this procedure were also discussed.  Bard Power PICC patient education guide, fact sheet on infection prevention and patient information card has been provided to patient /or left at bedside.  Consent obtained with daughter via telephone   PICC Placement Documentation  PICC Double Lumen 01/02/23 Right Basilic 40 cm 0 cm (Active)  Indication for Insertion or Continuance of Line Poor Vasculature-patient has had multiple peripheral attempts or PIVs lasting less than 24 hours 01/02/23 1000  Exposed Catheter (cm) 0 cm 01/02/23 1000  Site Assessment Clean, Dry, Intact 01/02/23 1000  Lumen #1 Status Flushed;Saline locked;Blood return noted 01/02/23 1000  Lumen #2 Status Flushed;Saline locked;Blood return noted 01/02/23 1000  Dressing Type Transparent 01/02/23 1000  Dressing Status Antimicrobial disc in place;Clean, Dry, Intact 01/02/23 1000  Safety Lock Not Applicable 01/02/23 1000  Line Care Connections checked and tightened 01/02/23 1000  Dressing Intervention New dressing 01/02/23 1000  Dressing Change Due 01/09/23 01/02/23 1000       Franne Grip Renee 01/02/2023, 10:06 AM

## 2023-01-02 NOTE — Plan of Care (Signed)
  Problem: Education: Goal: Knowledge of General Education information will improve Description: Including pain rating scale, medication(s)/side effects and non-pharmacologic comfort measures Outcome: Progressing   Problem: Health Behavior/Discharge Planning: Goal: Ability to manage health-related needs will improve Outcome: Progressing   Problem: Clinical Measurements: Goal: Ability to maintain clinical measurements within normal limits will improve Outcome: Progressing Goal: Will remain free from infection Outcome: Progressing Goal: Diagnostic test results will improve Outcome: Progressing Goal: Respiratory complications will improve Outcome: Progressing Goal: Cardiovascular complication will be avoided Outcome: Progressing   Problem: Activity: Goal: Risk for activity intolerance will decrease Outcome: Progressing   Problem: Nutrition: Goal: Adequate nutrition will be maintained Outcome: Progressing   Problem: Coping: Goal: Level of anxiety will decrease Outcome: Progressing   Problem: Elimination: Goal: Will not experience complications related to bowel motility Outcome: Progressing Goal: Will not experience complications related to urinary retention Outcome: Progressing   Problem: Pain Managment: Goal: General experience of comfort will improve Outcome: Progressing   Problem: Safety: Goal: Ability to remain free from injury will improve Outcome: Progressing   Problem: Skin Integrity: Goal: Risk for impaired skin integrity will decrease Outcome: Progressing   Problem: Education: Goal: Knowledge of disease or condition will improve Outcome: Progressing Goal: Knowledge of secondary prevention will improve (MUST DOCUMENT ALL) Outcome: Progressing Goal: Knowledge of patient specific risk factors will improve (Mark N/A or DELETE if not current risk factor) Outcome: Progressing   Problem: Ischemic Stroke/TIA Tissue Perfusion: Goal: Complications of ischemic  stroke/TIA will be minimized Outcome: Progressing   Problem: Coping: Goal: Will verbalize positive feelings about self Outcome: Progressing Goal: Will identify appropriate support needs Outcome: Progressing   Problem: Health Behavior/Discharge Planning: Goal: Ability to manage health-related needs will improve Outcome: Progressing Goal: Goals will be collaboratively established with patient/family Outcome: Progressing   Problem: Self-Care: Goal: Ability to participate in self-care as condition permits will improve Outcome: Progressing Goal: Verbalization of feelings and concerns over difficulty with self-care will improve Outcome: Progressing Goal: Ability to communicate needs accurately will improve Outcome: Progressing   Problem: Nutrition: Goal: Risk of aspiration will decrease Outcome: Progressing Goal: Dietary intake will improve Outcome: Progressing   Problem: Education: Goal: Knowledge of disease or condition will improve Outcome: Progressing Goal: Knowledge of secondary prevention will improve (MUST DOCUMENT ALL) Outcome: Progressing Goal: Knowledge of patient specific risk factors will improve (Mark N/A or DELETE if not current risk factor) Outcome: Progressing   Problem: Ischemic Stroke/TIA Tissue Perfusion: Goal: Complications of ischemic stroke/TIA will be minimized Outcome: Progressing   Problem: Coping: Goal: Will verbalize positive feelings about self Outcome: Progressing Goal: Will identify appropriate support needs Outcome: Progressing   Problem: Health Behavior/Discharge Planning: Goal: Ability to manage health-related needs will improve Outcome: Progressing Goal: Goals will be collaboratively established with patient/family Outcome: Progressing   Problem: Self-Care: Goal: Ability to participate in self-care as condition permits will improve Outcome: Progressing Goal: Verbalization of feelings and concerns over difficulty with self-care will  improve Outcome: Progressing Goal: Ability to communicate needs accurately will improve Outcome: Progressing   Problem: Nutrition: Goal: Risk of aspiration will decrease Outcome: Progressing Goal: Dietary intake will improve Outcome: Progressing   

## 2023-01-02 NOTE — Progress Notes (Signed)
[facility] Progress Note  CC:  acute on chronic anemia   Subjective: Patient is awake with mild confusion.  He stated he was at a hospital in [facility].  He complains of having mild nausea without vomiting.  No abdominal pain.  No chest pain or shortness of breath.  His RN reported that his appetite was less today and ate a few bites of applesauce and macaroni and cheese at lunchtime.  No bowel movement today.   Objective:  Vital signs in last 24 hours: Temp:  [98.1 F (36.7 C)-99.3 F (37.4 C)] 99.1 F (37.3 C) (05/31 1352) Pulse Rate:  [58-61] 60 (05/31 1352) Resp:  [16-18] 17 (05/31 1352) BP: (122-178)/(65-80) 127/74 (05/31 1352) SpO2:  [95 %-100 %] 99 % (05/31 1352) Last BM Date : 12/30/22 General: Chronically ill fatigued appearing 77 year old male in no acute distress. Heart: Regular rate and rhythm, no murmurs. Pulm: Breath sounds clear, decreased in the bases. Abdomen: Soft, mildly distended but soft.  Nontender.  Positive bowel sounds to all 4 quadrants. Extremities: Moderate bilateral lower extremity edema.  Bilateral padded boots in use. Neurologic:  Alert and oriented to name. Speech is softly spoken but clear. Moves all extremities weakly.  Psych:  Alert and cooperative. Normal mood and affect.  Intake/Output from previous day: 05/30 0701 - 05/31 0700 In: 1349.7 [P.O.:237; I.V.:724.5; IV Piggyback:388.2] Out: 700 [Urine:700] Intake/Output this shift: No intake/output data recorded.  Lab Results: Recent Labs    12/31/22 1201 01/01/23 0832 01/02/23 1205  WBC 5.3 6.3 8.5  HGB 9.9* 8.2* 7.7*  HCT 31.8* 25.9* 25.6*  PLT 23* 266 251   BMET Recent Labs    12/31/22 1201 01/01/23 0832 01/02/23 0418  NA 136 136 136  K 3.6 2.7* 3.9  CL 107 110 108  CO2 20* 19* 19*  GLUCOSE 85 100* 107*  BUN 10 8 8   CREATININE 1.35* 1.16 1.22  CALCIUM 7.9* 7.5* 7.7*   LFT Recent Labs    12/31/22 1201  PROT 6.8  ALBUMIN 2.3*  AST 40  ALT 14   ALKPHOS 70  BILITOT 0.6   PT/INR Recent Labs    12/30/22 1846  LABPROT 15.1  INR 1.2   Hepatitis Panel No results for input(s): "HEPBSAG", "HCVAB", "HEPAIGM", "HEPBIGM" in the last 72 hours.  CT ANGIO HEAD NECK W WO CM  Result Date: 01/01/2023 CLINICAL DATA:  Stroke, follow-up. EXAM: CT ANGIOGRAPHY HEAD AND NECK WITH AND WITHOUT CONTRAST TECHNIQUE: Precontrast CT of the head. Subsequently, multidetector CT imaging of the head and neck was performed using the standard protocol during bolus administration of intravenous contrast. Multiplanar CT image reconstructions and MIPs were obtained to evaluate the vascular anatomy. Carotid stenosis measurements (when applicable) are obtained utilizing NASCET criteria, using the distal internal carotid diameter as the denominator. RADIATION DOSE REDUCTION: This exam was performed according to the departmental dose-optimization program which includes automated exposure control, adjustment of the mA and/or kV according to patient size and/or use of iterative reconstruction technique. CONTRAST:  75mL OMNIPAQUE IOHEXOL 350 MG/ML SOLN COMPARISON:  MRI brain 12/31/2022.  CTA head/neck 09/16/2020. FINDINGS: CT HEAD FINDINGS Brain: No acute hemorrhage. Unchanged severe chronic small-vessel disease with old infarcts in the left frontal operculum, bilateral parietal lobes and left cerebellar hemisphere. The acute lacunar infarct seen on previous day's brain MRI is not resolved by CT. No hydrocephalus or extra-axial collection. Vascular: No hyperdense vessel or unexpected calcification. Skull: No calvarial fracture or suspicious bone lesion. Skull base is  unremarkable. Sinuses/Orbits: Unremarkable. Other: None. Review of the MIP images confirms the above findings CTA NECK FINDINGS Aortic arch: Three-vessel arch configuration. Atherosclerotic calcifications of the aortic arch and arch vessel origins. Arch vessel origins are patent. Right carotid system: No evidence of  dissection, stenosis (50% or greater), or occlusion. Extensive, circumferential calcified plaque of the right carotid bulb and proximal right cervical ICA. Left carotid system: Approximately 80% stenosis of the proximal left cervical ICA (image 73 series 10), progressed from February 2022. Vertebral arteries: Left dominant. Hypoplastic right vertebral artery is not visualized from its origin to the distal V2 segment where it reconstitutes via collateral flow. Severe stenosis of the left vertebral artery at its origin. Skeleton: Multilevel cervical spondylosis, worst at C5-6, where there is at least moderate spinal canal stenosis. Other neck: Unremarkable. Upper chest: Unremarkable. Review of the MIP images confirms the above findings CTA HEAD FINDINGS Anterior circulation: Calcified plaque along the carotid siphons without hemodynamically significant stenosis. The ACAs and MCAs are patent proximally. 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 Distal branches are symmetric. Posterior circulation: Normal basilar artery. The SCAs, AICAs and PICAs are patent proximally. The PCAs are patent proximally without stenosis or aneurysm. Distal branches are symmetric. Venous sinuses: As permitted by contrast timing, patent. Anatomic variants: Persistent fetal origin of the bilateral PCAs with hypoplastic P1 segments. Review of the MIP images confirms the above findings IMPRESSION: 1. Approximately 80% stenosis of the proximal left cervical ICA, progressed from February 2022. 2. Hypoplastic right vertebral artery is not visualized from its origin to the distal V2 segment where it reconstitutes via collateral flow. Severe stenosis of the left vertebral artery at its origin. 3. No intracranial large vessel occlusion or hemodynamically significant stenosis. 4. 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 Aortic Atherosclerosis (ICD10-I70.0). Electronically Signed   By: Orvan Falconer M.D.   On: 01/01/2023 18:29     Patient profile:  77 year old male with medical history significant for dementia, CVA with residual right sided weakness, seizures, bradycardia, CHF, carotid stenosis, CKD, duodenal adenoma, NSVT admitted 12/30/2022 with AKI, severe electrolye disturbances, acute on chronic microcytic anemia after family brought patient to ED with chief complaint of increased lethargy and confusion.    Assessment / Plan:  Acute on chronic anemia; history of duodenal adenomas. Admission Hg 5.5 transfused 2 units of PRBCs -> Hg 9.9 -> 8.2 -> today Hg 7.7. Iron 27, TIBC 242, Sat 11%. Ferritin 31. B12 > 7,5000. FOBT positive. Received IV iron.  -Enteroscopy +/- VCE after Plavix wash out, possibly Saturday or Sunday. I called his daughter/POA, Blima Ledger" and discussed benefits and risks discussed including risk with sedation, risk of bleeding, perforation and infection. Heather provided consent for enteroscopy and VCE. Timing to be determined, await further recommendations per Dr. Meridee Score  -CBC in am -Transfuse for Hg < 8  -Continue to monitor the patient closely for active GI bleeding -Continue PPI po bid   Acute metabolic encephalopathy in setting of dementia with acute/subacute CVA. -CT head shows advanced chronic ischemic disease, no acute abnormality. - MRI showed acute to subacute lacunar infarct of the right lower brainstem, pontomedullary junction and right medullary pyramid. Holding anticoagulation/antiplatelet in setting of GI bleed. -CTA Neck showed approximately 80% stenosis of the proximal left cervical ICA, progressed from February 2022. - Neurology recommended resuming either aspirin or Plavix monotherapy once anemia improves and cleared by GI. - Last dose plavix 5/28 AM   Nausea -Continue PPI bid -Zofran 4mg  po or IV  Q 6hrs as needed -Consider abdominal sonogram if nausea persists   Arrhythmia  - Evaluated by cardiology. Found to have rare PVCs and baseline bradycardia, suspect  arrhythmia is secondary to metabolic abnormalities   AKI, resolved  Sars coronavirus 19 positive on room air. Not on steroids or antiviral treatment.      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

## 2023-01-02 NOTE — NC FL2 (Signed)
[place] MEDICAID FL2 LEVEL OF CARE FORM     IDENTIFICATION  Patient Name: Andres White Birthdate: October 12, 1945 Sex: male Admission Date (Current Location): 12/30/2022  [facility] and [place] Number:  Producer, television/film/video and Address:  [facility] [place], [address] [place], [place] [address]      Provider Number: [id number]  Attending Physician Name and Address:  Burnadette Pop, MD  Relative Name and Phone Number:       Current Level of Care: Hospital Recommended Level of Care: Skilled Nursing Facility Prior Approval Number:    Date Approved/Denied:   PASRR Number: [id number]  Discharge Plan: SNF    Current Diagnoses: Patient Active Problem List   Diagnosis Date Noted   Acute on chronic anemia 01/01/2023   Prolonged QT interval 12/31/2022   Acute urinary retention 12/31/2022   COVID-19 virus infection 12/31/2022   Sinus bradycardia 12/31/2022   Right foot ulcer (HCC) 12/31/2022   Acute ischemic stroke (HCC) 12/31/2022   Antiplatelet or antithrombotic long-term use 12/31/2022   Frequent PVCs 12/30/2022   Acute encephalopathy 12/30/2022   Hypokalemia 08/30/2022   Acute metabolic encephalopathy 08/30/2022   UTI (urinary tract infection) 08/30/2022   Hypomagnesemia 08/30/2022   Acute renal failure (ARF) (HCC) 08/29/2022   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   Abnormal findings on esophagogastroduodenoscopy (EGD) 10/02/2020   Constipation 10/02/2020   Anemia 10/02/2020   History of seizure disorder 10/02/2020   History of stroke 10/02/2020   History of colon polyps 10/02/2020   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 10/02/2020   Endotracheally intubated 09/16/2020   Community acquired pneumonia 09/16/2020   Cervical spondylosis 09/16/2020   Vertebral artery stenosis 09/16/2020   Status epilepticus (HCC) 09/16/2020   CKD (chronic kidney disease) stage 3, GFR 30-59 ml/min (HCC) 09/16/2020   Occult blood in stools    Leukocytosis    Hyponatremia     Hypertension    Dysphagia, post-stroke    Dysesthesia    Neuropathic pain    Brainstem infarct, acute (HCC)    Acute blood loss anemia    Acute renal failure superimposed on stage 3b chronic kidney disease (HCC)    Stroke-like symptoms 06/27/2020   Right leg weakness    Left carotid stenosis 04/13/2014    Orientation RESPIRATION BLADDER Height & Weight     Self, Place  Normal Incontinent, Indwelling catheter Weight: 167 lb 8.8 oz (76 kg) Height:  5\' 8"  (172.7 cm)  BEHAVIORAL SYMPTOMS/MOOD NEUROLOGICAL BOWEL NUTRITION STATUS      Incontinent Diet (see DC summary)  AMBULATORY STATUS COMMUNICATION OF NEEDS Skin   Extensive Assist Verbally Other (Comment) (right heel eschar, gauze dressing: change daily)                       Personal Care Assistance Level of Assistance  Bathing, Feeding, Dressing Bathing Assistance: Maximum assistance Feeding assistance: Maximum assistance Dressing Assistance: Maximum assistance     Functional Limitations Info             SPECIAL CARE FACTORS FREQUENCY  PT (By licensed PT), OT (By licensed OT)     PT Frequency: 5x/wk OT Frequency: 5x/wk            Contractures Contractures Info: Not present    Additional Factors Info  Code Status, Allergies, Isolation Precautions Code Status Info: Full Allergies Info: NKA     Isolation Precautions Info: Airborne/Contact: COVID+ 12/30/22     Current Medications (01/02/2023):  This is the current  hospital active medication list Current Facility-Administered Medications  Medication Dose Route Frequency Provider Last Rate Last Admin   0.9 %  sodium chloride infusion (Manually program via Guardrails IV Fluids)   Intravenous Once Therisa Doyne, MD       acetaminophen (TYLENOL) tablet 650 mg  650 mg Oral Q6H PRN Therisa Doyne, MD       Or   acetaminophen (TYLENOL) suppository 650 mg  650 mg Rectal Q6H PRN Doutova, Anastassia, MD       albuterol (PROVENTIL) (2.5 MG/3ML) 0.083%  nebulizer solution 2.5 mg  2.5 mg Nebulization Q2H PRN Doutova, Anastassia, MD       atorvastatin (LIPITOR) tablet 40 mg  40 mg Oral Daily Doutova, Anastassia, MD   40 mg at 01/02/23 1129   Chlorhexidine Gluconate Cloth 2 % PADS 6 each  6 each Topical Q0600 Burnadette Pop, MD   6 each at 01/02/23 0556   citalopram (CELEXA) tablet 10 mg  10 mg Oral Daily Therisa Doyne, MD   10 mg at 01/02/23 1130   feeding supplement (ENSURE ENLIVE / ENSURE PLUS) liquid 237 mL  237 mL Oral TID BM Burnadette Pop, MD   237 mL at 01/02/23 1132   ferric gluconate (FERRLECIT) 250 mg in sodium chloride 0.9 % 250 mL IVPB  250 mg Intravenous Daily Burnadette Pop, MD 135 mL/hr at 01/02/23 1125 250 mg at 01/02/23 1125   HYDROcodone-acetaminophen (NORCO/VICODIN) 5-325 MG per tablet 1-2 tablet  1-2 tablet Oral Q4H PRN Therisa Doyne, MD       levETIRAcetam (KEPPRA) tablet 500 mg  500 mg Oral BID Marvel Plan, MD   500 mg at 01/02/23 1130   multivitamin with minerals tablet 1 tablet  1 tablet Oral Daily Burnadette Pop, MD   1 tablet at 01/02/23 1130   ondansetron (ZOFRAN) tablet 4 mg  4 mg Oral Q6H PRN Therisa Doyne, MD       Or   ondansetron (ZOFRAN) injection 4 mg  4 mg Intravenous Q6H PRN Doutova, Anastassia, MD       pantoprazole (PROTONIX) EC tablet 40 mg  40 mg Oral BID Doutova, Anastassia, MD   40 mg at 01/02/23 1131   phosphorus (K PHOS NEUTRAL) tablet 500 mg  500 mg Oral TID Burnadette Pop, MD   500 mg at 01/02/23 1131   QUEtiapine (SEROQUEL) tablet 50 mg  50 mg Oral QHS Doutova, Anastassia, MD   50 mg at 01/01/23 2125   sodium chloride flush (NS) 0.9 % injection 10-40 mL  10-40 mL Intracatheter Q12H Adhikari, Amrit, MD   20 mL at 01/02/23 1131   sodium chloride flush (NS) 0.9 % injection 10-40 mL  10-40 mL Intracatheter PRN Burnadette Pop, MD       tamsulosin (FLOMAX) capsule 0.4 mg  0.4 mg Oral QPC supper Doutova, Anastassia, MD   0.4 mg at 01/01/23 1827   traMADol (ULTRAM) tablet 50 mg  50 mg  Oral BID PRN Therisa Doyne, MD         Discharge Medications: Please see discharge summary for a list of discharge medications.  Relevant Imaging Results:  Relevant Lab Results:   Additional Information SS#: [id number]  Baldemar Lenis, LCSW

## 2023-01-03 ENCOUNTER — Encounter (HOSPITAL_COMMUNITY)
Admission: EM | Disposition: A | Discharge status: Home / Self Care | Payer: Self-pay | Source: Home / Self Care | Attending: Internal Medicine

## 2023-01-03 ENCOUNTER — Inpatient Hospital Stay (HOSPITAL_COMMUNITY): Payer: Medicare Other | Admitting: Certified Registered Nurse Anesthetist

## 2023-01-03 ENCOUNTER — Encounter (HOSPITAL_COMMUNITY): Payer: Self-pay | Admitting: Internal Medicine

## 2023-01-03 DIAGNOSIS — K3189 Other diseases of stomach and duodenum: Secondary | ICD-10-CM | POA: Diagnosis not present

## 2023-01-03 DIAGNOSIS — D509 Iron deficiency anemia, unspecified: Secondary | ICD-10-CM | POA: Diagnosis not present

## 2023-01-03 DIAGNOSIS — K449 Diaphragmatic hernia without obstruction or gangrene: Secondary | ICD-10-CM | POA: Diagnosis not present

## 2023-01-03 DIAGNOSIS — K31819 Angiodysplasia of stomach and duodenum without bleeding: Secondary | ICD-10-CM

## 2023-01-03 DIAGNOSIS — I6990 Unspecified sequelae of unspecified cerebrovascular disease: Secondary | ICD-10-CM

## 2023-01-03 DIAGNOSIS — K317 Polyp of stomach and duodenum: Secondary | ICD-10-CM

## 2023-01-03 DIAGNOSIS — G9340 Encephalopathy, unspecified: Secondary | ICD-10-CM | POA: Diagnosis not present

## 2023-01-03 HISTORY — PX: HOT HEMOSTASIS: SHX5433

## 2023-01-03 HISTORY — PX: ENTEROSCOPY: SHX5533

## 2023-01-03 HISTORY — PX: SUBMUCOSAL TATTOO INJECTION: SHX6856

## 2023-01-03 LAB — BASIC METABOLIC PANEL
Anion gap: 6 (ref 5–15)
BUN: 7 mg/dL — ABNORMAL LOW (ref 8–23)
CO2: 21 mmol/L — ABNORMAL LOW (ref 22–32)
Calcium: 7.6 mg/dL — ABNORMAL LOW (ref 8.9–10.3)
Chloride: 109 mmol/L (ref 98–111)
Creatinine, Ser: 1.03 mg/dL (ref 0.61–1.24)
GFR, Estimated: >60 mL/min (ref >60)
Glucose, Bld: 96 mg/dL (ref 70–99)
Potassium: 2.9 mmol/L — ABNORMAL LOW (ref 3.5–5.1)
Sodium: 136 mmol/L (ref 135–145)

## 2023-01-03 LAB — CULTURE, BLOOD (ROUTINE X 2): Culture: NO GROWTH

## 2023-01-03 LAB — MAGNESIUM: Magnesium: 1.6 mg/dL — ABNORMAL LOW (ref 1.7–2.4)

## 2023-01-03 LAB — CBC
HCT: 25.4 % — ABNORMAL LOW (ref 39.0–52.0)
Hemoglobin: 7.8 g/dL — ABNORMAL LOW (ref 13.0–17.0)
MCH: 23.5 pg — ABNORMAL LOW (ref 26.0–34.0)
MCHC: 30.7 g/dL (ref 30.0–36.0)
MCV: 76.5 fL — ABNORMAL LOW (ref 80.0–100.0)
Platelets: 236 10*3/uL (ref 150–400)
RBC: 3.32 MIL/uL — ABNORMAL LOW (ref 4.22–5.81)
RDW: 21.3 % — ABNORMAL HIGH (ref 11.5–15.5)
WBC: 8.9 10*3/uL (ref 4.0–10.5)
nRBC: 0.2 % (ref 0.0–0.2)

## 2023-01-03 LAB — POTASSIUM: Potassium: 5.2 mmol/L — ABNORMAL HIGH (ref 3.5–5.1)

## 2023-01-03 LAB — PHOSPHORUS: Phosphorus: 2.8 mg/dL (ref 2.5–4.6)

## 2023-01-03 SURGERY — ENTEROSCOPY
Anesthesia: Monitor Anesthesia Care

## 2023-01-03 MED ORDER — POTASSIUM CHLORIDE 10 MEQ/100ML IV SOLN
INTRAVENOUS | Status: DC | PRN
  Administered 2023-01-03: 10 meq via INTRAVENOUS

## 2023-01-03 MED ORDER — POLYETHYLENE GLYCOL 3350 17 G PO PACK
17.0000 g | PACK | Freq: Every day | ORAL | Status: DC
  Administered 2023-01-03: 17 g via ORAL
  Filled 2023-01-03 (×2): qty 1

## 2023-01-03 MED ORDER — SODIUM CHLORIDE 0.9 % IV SOLN
INTRAVENOUS | Status: AC | PRN
  Administered 2023-01-03: 500 mL via INTRAVENOUS

## 2023-01-03 MED ORDER — SODIUM CHLORIDE 0.9 % IV SOLN: INTRAVENOUS | Status: DC

## 2023-01-03 MED ORDER — PHENYLEPHRINE HCL-NACL 20-0.9 MG/250ML-% IV SOLN
INTRAVENOUS | Status: DC | PRN
  Administered 2023-01-03: 20 ug/min via INTRAVENOUS

## 2023-01-03 MED ORDER — POTASSIUM CHLORIDE 10 MEQ/100ML IV SOLN
10.0000 meq | Freq: Once | INTRAVENOUS | Status: DC
  Filled 2023-01-03: qty 100

## 2023-01-03 MED ORDER — PROPOFOL 500 MG/50ML IV EMUL
INTRAVENOUS | Status: DC | PRN
  Administered 2023-01-03: 50 ug/kg/min via INTRAVENOUS

## 2023-01-03 MED ORDER — MAGNESIUM SULFATE 2 GM/50ML IV SOLN
2.0000 g | Freq: Once | INTRAVENOUS | Status: AC
  Administered 2023-01-03: 2 g via INTRAVENOUS
  Filled 2023-01-03: qty 50

## 2023-01-03 MED ORDER — POTASSIUM CHLORIDE 20 MEQ PO PACK: 40.0000 meq | PACK | ORAL | Status: DC

## 2023-01-03 MED ORDER — PROPOFOL 10 MG/ML IV BOLUS
INTRAVENOUS | Status: DC | PRN
  Administered 2023-01-03: 20 mg via INTRAVENOUS
  Administered 2023-01-03: 50 mg via INTRAVENOUS

## 2023-01-03 MED ORDER — SPOT INK MARKER SYRINGE KIT
PACK | SUBMUCOSAL | Status: DC | PRN
  Administered 2023-01-03: 1.5 mL via SUBMUCOSAL

## 2023-01-03 MED ORDER — POTASSIUM CHLORIDE 10 MEQ/100ML IV SOLN
10.0000 meq | INTRAVENOUS | Status: AC
  Administered 2023-01-03 (×5): 10 meq via INTRAVENOUS
  Filled 2023-01-03 (×5): qty 100

## 2023-01-03 MED ORDER — SENNA 8.6 MG PO TABS
1.0000 | ORAL_TABLET | Freq: Two times a day (BID) | ORAL | Status: DC
  Administered 2023-01-03 – 2023-01-05 (×3): 8.6 mg via ORAL
  Filled 2023-01-03 (×3): qty 1

## 2023-01-03 MED ORDER — SUCRALFATE 1 G PO TABS
1.0000 g | ORAL_TABLET | Freq: Two times a day (BID) | ORAL | Status: DC
  Administered 2023-01-03 – 2023-01-05 (×5): 1 g via ORAL
  Filled 2023-01-03 (×7): qty 1

## 2023-01-03 NOTE — Op Note (Signed)
[facility] Patient Name: Andres White Procedure Date : 01/03/2023 MRN: [id number] Attending MD: Corliss Parish , MD, [id number] Date of Birth: 1945-08-14 CSN: [id number] Age: 77 Admit Type: Inpatient Procedure:                Small bowel enteroscopy Indications:              Iron deficiency anemia, Follow-up of polyps in the                            duodenum (previously resected duodenal adenomas in                            2022) Providers:                Corliss Parish, MD, Adolph Pollack, RN,                            Brion Aliment, Technician Referring MD:             Venita Lick. Russella Dar, MD, inpatient medical service Medicines:                Monitored Anesthesia Care Complications:            No immediate complications. Estimated Blood Loss:     Estimated blood loss was minimal. Procedure:                Pre-Anesthesia Assessment:                           - Prior to the procedure, a History and Physical                            was performed, and patient medications and                            allergies were reviewed. The patient's tolerance of                            previous anesthesia was also reviewed. The risks                            and benefits of the procedure and the sedation                            options and risks were discussed with the patient.                            All questions were answered, and informed consent                            was obtained. Prior Anticoagulants: The patient has                            taken Plavix (clopidogrel), last dose was 4 days  prior to procedure. ASA Grade Assessment: III - A                            patient with severe systemic disease. After                            reviewing the risks and benefits, the patient was                            deemed in satisfactory condition to undergo the                            procedure.                            After obtaining informed consent, the endoscope was                            passed under direct vision. Throughout the                            procedure, the patient's blood pressure, pulse, and                            oxygen saturations were monitored continuously. The                            PCF-HQ190L ([id number]) Olympus colonoscope was                            introduced through the mouth and advanced to the                            proximal jejunum. The small bowel enteroscopy was                            accomplished without difficulty. The patient                            tolerated the procedure. Scope In: Scope Out: Findings:      No gross lesions were noted in the entire esophagus.      The Z-line was regular and was found 39 cm from the incisors.      A 2 cm hiatal hernia was present.      Patchy mildly friable mucosa with contact bleeding was found in the       cardia.      11, small angiodysplastic lesions with no evidence of bleeding but       typical arborization were found in the cardia, in the gastric fundus, in       the gastric body and at the incisura. Fulguration to ablate the lesion       to prevent bleeding by argon plasma (gastric settings) was successful.      No other gross lesions were noted in the entire examined stomach.      A single angiodysplastic lesion with  no bleeding was found in the       duodenal bulb. Fulguration to ablate the lesion to prevent bleeding by       argon plasma was successful.      Scars were found in the second portion of the duodenum and in the third       portion of the duodenum. Each scar was found just distal to previously       placed tattoo from previous EMR sites of duodenal adenomas. No overt       evidence of recurrence at either site.      Normal mucosa was found in the rest of the visualized duodenum.      A single angiodysplastic lesion with no bleeding was found in the       proximal jejunum.  Fulguration to ablate the lesion to prevent bleeding       by argon plasma was successful.      Normal mucosa was found in the rest of the visualized proximal jejunum.       Area was tattooed with an injection of Spot (carbon black) to demarcate       distal extent of today's procedure. Impression:               - No gross lesions in the entire esophagus. Z-line                            regular, 39 cm from the incisors.                           - 2 cm hiatal hernia.                           - Friable gastric mucosa in the cardia.                           - 11, typical arborization angiodysplastic lesions                            in the stomach. Treated with argon plasma                            coagulation (APC).                           - No other gross lesions in the entire stomach.                           - A single non-bleeding angiodysplastic lesion in                            the duodenum bulb. Treated with argon plasma                            coagulation (APC).                           - Duodenal scars and tattoos noted in D2 and D3  from previous resections (no recurrence noted).                           - Normal mucosa was found in the rest of the                            examined duodenum.                           - A single non-bleeding angiodysplastic lesion in                            the jejunum. Treated with argon plasma coagulation                            (APC).                           - Normal mucosa was found in the visualized                            proximal jejunum. Tattooed distal extent. Recommendation:           - The patient will be observed post-procedure,                            until all discharge criteria are met.                           - Discharge patient to home.                           - Resume previous diet.                           - Await pathology results.                           -  Continue PPI twice daily.                           - Initiate Carafate twice daily for 1 month and                            then may stop.                           - May reinitiate Plavix in 72 hours (6/4) to                            decrease risk of post interventional bleeding risk.                           - Trend hemoglobin/hematocrit.                           - Agree with IV iron  while in-house/in the hospital.                           - Hematology referral for outpatient iron infusions                            as needed is recommended.                           - Should patient have evidence of recurrent iron                            deficiency anemia, recommend video capsule                            endoscopy to evaluate the rest of the small bowel.                           - No additional follow-up is required from the                            duodenal adenomas based on the patient's age.                           - Follow-up as needed with patient's primary GI                            (Dr. Russella Dar) in the future.                           - Inpatient GI service will sign off at this time,                            if questions arise please reach out to [place].                           - The findings and recommendations were discussed                            with the patient.                           - The findings and recommendations were discussed                            with the patient's family.                           - The findings and recommendations were discussed                            with the referring physician. Procedure Code(s):        --- Professional ---  [phone], Small intestinal endoscopy, enteroscopy                            beyond second portion of duodenum, not including                            ileum; with control of bleeding (eg, injection,                            bipolar cautery, unipolar cautery,  laser, heater                            probe, stapler, plasma coagulator)                           44799, Unlisted procedure, small intestine Diagnosis Code(s):        --- Professional ---                           K44.9, Diaphragmatic hernia without obstruction or                            gangrene                           K31.89, Other diseases of stomach and duodenum                           K31.819, Angiodysplasia of stomach and duodenum                            without bleeding                           D50.9, Iron deficiency anemia, unspecified                           K31.7, Polyp of stomach and duodenum CPT copyright 2022 [employer] Association. All rights reserved. The codes documented in this report are preliminary and upon coder review may  be revised to meet current compliance requirements. Corliss Parish, MD 01/03/2023 11:06:00 AM Number of Addenda: 0

## 2023-01-03 NOTE — Progress Notes (Signed)
PROGRESS NOTE  Andres White  ZOX:[id number] DOB: [birth year] DOA: 12/30/2022 PCP: Hildred Priest, FNP   Brief Narrative: Patient is a 77 year old male with history of dementia, BPH stroke with right-sided weakness, seizure disorder, hypertension, peripheral artery disease, diastolic CHF, carotid stenosis, bradycardia who presented with confusion .  As per the report, patient was lethargic for 4 days.  On presentation he was bradycardic, hypotensive.  He was recently treated for UTI with Levaquin.  Lab work on presentation showed potassium of 2.2, hemoglobin in the range of 5.  MRI showed acute to subacute ischemic stroke.  GI, cardiology, neurology, palliative care consulted.GI did enteroscopy today with finding of AVMs.  PT/OT recommending SNF but daughter wants to take him home tomorrow  Assessment & Plan:  Principal Problem:   Acute encephalopathy Active Problems:   Left carotid stenosis   Stroke-like symptoms   Acute blood loss anemia   Acute renal failure superimposed on stage 3b chronic kidney disease (HCC)   Hypertension   CKD (chronic kidney disease) stage 3, GFR 30-59 ml/min (HCC)   Duodenal adenoma   History of seizure disorder   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   Hypokalemia   Acute metabolic encephalopathy   UTI (urinary tract infection)   Hypomagnesemia   Frequent PVCs   Prolonged QT interval   Acute urinary retention   COVID-19 virus infection   Sinus bradycardia   Right foot ulcer (HCC)   Acute ischemic stroke (HCC)   Antiplatelet or antithrombotic long-term use   Acute on chronic anemia   Heme positive stool    Acute encephalopathy: History of known dementia.  More confused from baseline.  Lethargic for 4 days.  Multifactorial but likely from stroke.Currently he is oriented to place only .  Obeys commands.  Has generalized weakness.PT/OT recommending SNF on discharge,daughter wants to take him home  Acute/Subacute stroke: MRI showed  acute to subacute  lacunar infarct of the Right lower brainstem :Pontomedullary junction and right medullary pyramid.  [name]  Has history of stroke.  Ambulates with walker/wheelchair.  PT/OT/speech evaluation.  Initiated stroke workup.Not a candidate for antiplatelet or anticoagulation at the moment due to possible GI bleed.  Neurology following.  Neurology recommending antiplatelet monotherapy .  Will resume Plavix after 72 hours  H/O seizure.  Seizure could not be ruled out.  Spot EEG did not show any seizure or epileptiform activity.   Neurology recommended continue home Keppra  Severe anemia/GI bleed: Hemoglobin in the range of 5 on presentation.  Transfused with 2 units of PRBC.  FOBT positive.  GI consulted .  Monitor H&H. Patient has history of chronic gastritis.No hematochezia or melena as per the daughter.  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  GI recommending Protonix and Carafate. Iron studies showed low iron,  given IV iron  AKI on CKD stage IIIb: Foley catheter placement at Emergency Department for urinary retention.  Presented with creatinine of 1.6, kidney function normalised.  Will give voiding trial when appropriate  Frequent PVCs/bradycardia/prolonged QTc: Cardiology consulted.  Has chronic sinus bradycardia.  Cardiology signed off Patient has prolonged QT.  Monitor on telemetry.  TSH normal.  Will check follow-up EKG  Covid: This could also  have attributed to weakness.  Currently on room air.  Continue supportive care.  Not a started on steroids or antivirals.  Right foot ulcer: appears to be noninfected.  Wound care consulted and following  Goals of care: Patient has multiple comorbidities.  Has  very poor oral intake.  Due to his advanced age and poor prognosis, he might be a candidate for hospice.  Palliative care following discussed with family.  Family wants to keep him full code and continue full scope of treatment.  Daughter wants to take him home  Nutrition Problem:  Increased nutrient needs Etiology: catabolic illness (HF, COVID, increased needs for wound healing)    DVT prophylaxis:SCDs Start: 12/30/22 2309     Code Status: Full Code  Family Communication: Discussed with daughter at bedside today Patient status:Inpatient  Patient is from :Home  Anticipated discharge to: Home  Estimated DC date: Tomorrow   Consultants: Cardiology,GI, neurology  Procedures:None  Antimicrobials:  Anti-infectives (From admission, onward)    Start     Dose/Rate Route Frequency Ordered Stop   12/30/22 2345  cefTRIAXone (ROCEPHIN) 1 g in sodium chloride 0.9 % 100 mL IVPB  Status:  Discontinued        1 g 200 mL/hr over 30 Minutes Intravenous Daily at bedtime 12/30/22 2317 01/01/23 0928       Subjective: Patient seen and examined at bedside today.  Hemodynamically stable, lying in bed.  Mental status at baseline per daughter at bedside.  Looks comfortable, denies any complaints   Objective: Vitals:   01/03/23 1049 01/03/23 1100 01/03/23 1104 01/03/23 1111  BP: 92/62 106/67  114/67  Pulse: 74 (!) 59  (!) 53  Resp: 20 19  17   Temp: 97.7 F (36.5 C)     TempSrc: Oral     SpO2: 93% 94% 90% 93%  Weight:      Height:        Intake/Output Summary (Last 24 hours) at 01/03/2023 1116 Last data filed at 01/03/2023 0541 Gross per 24 hour  Intake 430 ml  Output 1175 ml  Net -745 ml   Filed Weights   12/31/22 0500 01/01/23 0427 01/03/23 0951  Weight: 74.8 kg 76 kg 76 kg    Examination:   General exam: Overall comfortable, not in distress, appears weak and deconditioned HEENT: PERRL Respiratory system:  no wheezes or crackles  Cardiovascular system: S1 & S2 heard, RRR.  Gastrointestinal system: Abdomen is nondistended, soft and nontender. Central nervous system: Alert and awake but oriented to place only, generalized weakness  Extremities: No edema, no clubbing ,no cyanosis Skin: right heel ulcer     Data Reviewed: I have personally reviewed  following labs and imaging studies  CBC: Recent Labs  Lab 12/30/22 1846 12/31/22 1201 01/01/23 0832 01/02/23 1205 01/03/23 0600  WBC 5.3 5.3 6.3 8.5 8.9  NEUTROABS 3.0  --   --   --   --   HGB 5.5* 9.9* 8.2* 7.7* 7.8*  HCT 19.8* 31.8* 25.9* 25.6* 25.4*  MCV 74.4* 76.8* 76.9* 76.4* 76.5*  PLT 311 23* 266 251 236   Basic Metabolic Panel: Recent Labs  Lab 12/30/22 1846 12/31/22 1201 01/01/23 0832 01/02/23 0418 01/03/23 0329 01/03/23 0600  NA 135 136 136 136  --  136  K 2.2* 3.6 2.7* 3.9  --  2.9*  CL 104 107 110 108  --  109  CO2 22 20* 19* 19*  --  21*  GLUCOSE 112* 85 100* 107*  --  96  BUN 11 10 8 8   --  7*  CREATININE 1.68* 1.35* 1.16 1.22  --  1.03  CALCIUM 7.8* 7.9* 7.5* 7.7*  --  7.6*  MG 1.4* 1.9 1.8 1.7 1.6*  --   PHOS  --  2.4* 2.8  2.1* 2.8  --      Recent Results (from the past 240 hour(s))  Culture, blood (Routine x 2)     Status: None (Preliminary result)   Collection Time: 12/30/22  6:46 PM   Specimen: BLOOD  Result Value Ref Range Status   Specimen Description BLOOD RIGHT ANTECUBITAL  Final   Special Requests   Final    BOTTLES DRAWN AEROBIC AND ANAEROBIC Blood Culture results may not be optimal due to an inadequate volume of blood received in culture bottles   Culture   Final    NO GROWTH 4 DAYS Performed at [facility], [address], [place], [place] [address]    Report Status PENDING  Incomplete  Urine Culture     Status: Abnormal   Collection Time: 12/30/22  9:06 PM   Specimen: Urine, Random  Result Value Ref Range Status   Specimen Description URINE, RANDOM  Final   Special Requests NONE Reflexed from [id number]  Final   Culture (A)  Final    <10,000 COLONIES/mL INSIGNIFICANT GROWTH Performed at [facility], [address], [place], [place] [address]    Report Status 12/31/2022 FINAL  Final  SARS Coronavirus 2 by RT PCR (hospital order, performed in [facility] hospital lab) *cepheid single result test* Anterior Nasal Swab      Status: Abnormal   Collection Time: 12/30/22  9:17 PM   Specimen: Anterior Nasal Swab  Result Value Ref Range Status   SARS Coronavirus 2 by RT PCR POSITIVE (A) NEGATIVE Final    Comment: Performed at [facility], [address], [place], [place] [address]  Culture, blood (Routine X 2) w Reflex to ID Panel     Status: None (Preliminary result)   Collection Time: 12/31/22  8:07 AM   Specimen: BLOOD LEFT HAND  Result Value Ref Range Status   Specimen Description BLOOD LEFT HAND  Final   Special Requests   Final    BOTTLES DRAWN AEROBIC ONLY Blood Culture results may not be optimal due to an inadequate volume of blood received in culture bottles   Culture   Final    NO GROWTH 3 DAYS Performed at [facility], [address] [place], [place] [address]    Report Status PENDING  Incomplete     Radiology Studies: CT ANGIO HEAD NECK W WO CM  Result Date: 01/01/2023 CLINICAL DATA:  Stroke, follow-up. EXAM: CT ANGIOGRAPHY HEAD AND NECK WITH AND WITHOUT CONTRAST TECHNIQUE: Precontrast CT of the head. Subsequently, multidetector CT imaging of the head and neck was performed using the standard protocol during bolus administration of intravenous contrast. Multiplanar CT image reconstructions and MIPs were obtained to evaluate the vascular anatomy. Carotid stenosis measurements (when applicable) are obtained utilizing NASCET criteria, using the distal internal carotid diameter as the denominator. RADIATION DOSE REDUCTION: This exam was performed according to the departmental dose-optimization program which includes automated exposure control, adjustment of the mA and/or kV according to patient size and/or use of iterative reconstruction technique. CONTRAST:  75mL OMNIPAQUE IOHEXOL 350 MG/ML SOLN COMPARISON:  MRI brain 12/31/2022.  CTA head/neck 09/16/2020. FINDINGS: CT HEAD FINDINGS Brain: No acute hemorrhage. Unchanged severe chronic small-vessel disease with old infarcts in the left frontal  operculum, bilateral parietal lobes and left cerebellar hemisphere. The acute lacunar infarct seen on previous day's brain MRI is not resolved by CT. No hydrocephalus or extra-axial collection. Vascular: No hyperdense vessel or unexpected calcification. Skull: No calvarial fracture or  suspicious bone lesion. Skull base is unremarkable. Sinuses/Orbits: Unremarkable. Other: None. Review of the MIP images confirms the above findings CTA NECK FINDINGS Aortic arch: Three-vessel arch configuration. Atherosclerotic calcifications of the aortic arch and arch vessel origins. Arch vessel origins are patent. Right carotid system: No evidence of dissection, stenosis (50% or greater), or occlusion. Extensive, circumferential calcified plaque of the right carotid bulb and proximal right cervical ICA. Left carotid system: Approximately 80% stenosis of the proximal left cervical ICA (image 73 series 10), progressed from February 2022. Vertebral arteries: Left dominant. Hypoplastic right vertebral artery is not visualized from its origin to the distal V2 segment where it reconstitutes via collateral flow. Severe stenosis of the left vertebral artery at its origin. Skeleton: Multilevel cervical spondylosis, worst at C5-6, where there is at least moderate spinal canal stenosis. Other neck: Unremarkable. Upper chest: Unremarkable. Review of the MIP images confirms the above findings CTA HEAD FINDINGS Anterior circulation: Calcified plaque along the carotid siphons without hemodynamically significant stenosis. The ACAs and MCAs are patent proximally. 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 Distal branches are symmetric. Posterior circulation: Normal basilar artery. The SCAs, AICAs and PICAs are patent proximally. The PCAs are patent proximally without stenosis or aneurysm. Distal branches are symmetric. Venous sinuses: As permitted by contrast timing, patent. Anatomic variants: Persistent fetal origin of the bilateral PCAs  with hypoplastic P1 segments. Review of the MIP images confirms the above findings IMPRESSION: 1. Approximately 80% stenosis of the proximal left cervical ICA, progressed from February 2022. 2. Hypoplastic right vertebral artery is not visualized from its origin to the distal V2 segment where it reconstitutes via collateral flow. Severe stenosis of the left vertebral artery at its origin. 3. No intracranial large vessel occlusion or hemodynamically significant stenosis. 4. 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 Aortic Atherosclerosis (ICD10-I70.0). Electronically Signed   By: Orvan Falconer M.D.   On: 01/01/2023 18:29    Scheduled Meds:  sodium chloride   Intravenous Once   atorvastatin  40 mg Oral Daily   Chlorhexidine Gluconate Cloth  6 each Topical Q0600   citalopram  10 mg Oral Daily   feeding supplement  237 mL Oral TID BM   levETIRAcetam  500 mg Oral BID   multivitamin with minerals  1 tablet Oral Daily   pantoprazole  40 mg Oral BID   phosphorus  500 mg Oral TID   QUEtiapine  50 mg Oral QHS   sodium chloride flush  10-40 mL Intracatheter Q12H   sucralfate  1 g Oral BID   tamsulosin  0.4 mg Oral QPC supper   Continuous Infusions:  ferric gluconate (FERRLECIT) IVPB 250 mg (01/02/23 1125)   potassium chloride 10 mEq (01/03/23 0918)     LOS: 3 days   Burnadette Pop, MD [facility] P6/08/2022, 11:16 AM

## 2023-01-03 NOTE — Anesthesia Postprocedure Evaluation (Signed)
Anesthesia Post Note  Patient: Andres White  Procedure(s) Performed: ENTEROSCOPY HOT HEMOSTASIS (ARGON PLASMA COAGULATION/BICAP) SUBMUCOSAL TATTOO INJECTION     Patient location during evaluation: Endoscopy Anesthesia Type: MAC Level of consciousness: awake and alert Pain management: pain level controlled Vital Signs Assessment: post-procedure vital signs reviewed and stable Respiratory status: spontaneous breathing, nonlabored ventilation, respiratory function stable and patient connected to nasal cannula oxygen Cardiovascular status: stable and blood pressure returned to baseline Postop Assessment: no apparent nausea or vomiting Anesthetic complications: no   No notable events documented.  Last Vitals:  Vitals:   01/03/23 1111 01/03/23 1236  BP: 114/67 116/66  Pulse: (!) 53 (!) 44  Resp: 17   Temp:  36.6 C  SpO2: 93% 100%    Last Pain:  Vitals:   01/03/23 1236  TempSrc: Oral  PainSc:                  Rayetta Veith

## 2023-01-03 NOTE — Transfer of Care (Signed)
Immediate Anesthesia Transfer of Care Note  Patient: Andres White  Procedure(s) Performed: ENTEROSCOPY HOT HEMOSTASIS (ARGON PLASMA COAGULATION/BICAP) SUBMUCOSAL TATTOO INJECTION  Patient Location: Endoscopy Unit  Anesthesia Type:MAC  Level of Consciousness: drowsy  Airway & Oxygen Therapy: Patient Spontanous Breathing and Patient connected to nasal cannula oxygen  Post-op Assessment: Report given to RN and Post -op Vital signs reviewed and stable  Post vital signs: Reviewed and stable  Last Vitals:  Vitals Value Taken Time  BP 110/80   Temp 98   Pulse 85   Resp 16   SpO2 96     Last Pain:  Vitals:   01/03/23 0951  TempSrc: Oral  PainSc: 0-No pain         Complications: No notable events documented.

## 2023-01-03 NOTE — TOC Initial Note (Signed)
Transition of Care [facility]) - Initial/Assessment Note    Patient Details  Name: Andres White MRN: [id number] Date of Birth: 01-Sep-1945  [facility] CM/SW Contact:    Lawerance Sabal, RN Phone Number: 01/03/2023, 2:27 PM  Clinical Narrative:                  Spoke to patient's daughter and she states he has bed, WC RW at home, no DME needs. She feels her and her husband will be able to get him home, PTAR not needed.  She asked about home oxygen, I checked w attending, no O2 needs for now.  She would like to have [facility] back. Kelly with [facility] able to accept. Orders placed.   Expected Discharge Plan: Home w Home Health Services Barriers to Discharge: Continued Medical Work up   Patient Goals and CMS Choice Patient states their goals for this hospitalization and ongoing recovery are:: to go home CMS Medicare.gov Compare Post Acute Care list provided to:: Other (Comment Required) Choice offered to / list presented to : Adult Children      Expected Discharge Plan and Services In-house Referral: Hospice / Palliative Care Discharge Planning Services: CM Consult Post Acute Care Choice: Home Health Living arrangements for the past 2 months: Single Family Home                 DME Arranged: N/A DME Agency: NA       HH Arranged: RN, PT, OT HH Agency: [facility] Date HH Agency Contacted: 01/03/23 Time HH Agency Contacted: 1425 Representative spoke with at [facility] Agency: Tresa Endo  Prior Living Arrangements/Services Living arrangements for the past 2 months: Single Family Home Lives with:: Adult Children   Do you feel safe going back to the place where you live?: No      Need for Family Participation in Patient Care: Yes (Comment) Care giver support system in place?: Yes (comment) Current home services: DME Criminal Activity/Legal Involvement Pertinent to Current Situation/Hospitalization: No - Comment as needed  Activities of Daily Living Home Assistive  Devices/Equipment: Walker (specify type) ADL Screening (condition at time of admission) Patient's cognitive ability adequate to safely complete daily activities?: No Is the patient deaf or have difficulty hearing?: No Does the patient have difficulty seeing, even when wearing glasses/contacts?: No Does the patient have difficulty concentrating, remembering, or making decisions?: Yes Patient able to express need for assistance with ADLs?: Yes Does the patient have difficulty dressing or bathing?: Yes Independently performs ADLs?: No Communication: Independent Dressing (OT): Dependent Is this a change from baseline?: Pre-admission baseline Grooming: Dependent Is this a change from baseline?: Pre-admission baseline Feeding: Independent Bathing: Dependent Is this a change from baseline?: Pre-admission baseline Toileting: Dependent Is this a change from baseline?: Pre-admission baseline In/Out Bed: Dependent Is this a change from baseline?: Pre-admission baseline Walks in Home: Dependent (pt uses wheelchair at home) Is this a change from baseline?: Pre-admission baseline Does the patient have difficulty walking or climbing stairs?: Yes Weakness of Legs: Both Weakness of Arms/Hands: Both  Permission Sought/Granted                  Emotional Assessment         Alcohol / Substance Use: Not Applicable Psych Involvement: No (comment)  Admission diagnosis:  Hypokalemia [E87.6] Hypomagnesemia [E83.42] Lethargy [R53.83] Acute cystitis without hematuria [N30.00] Acute encephalopathy [G93.40] AKI (acute kidney injury) (HCC) [N17.9] Anemia, unspecified type [D64.9] Acute ischemic stroke [facility]) [I63.9] Patient Active Problem List  Diagnosis Date Noted   Heme positive stool 01/02/2023   Acute on chronic anemia 01/01/2023   Prolonged QT interval 12/31/2022   Acute urinary retention 12/31/2022   COVID-19 virus infection 12/31/2022   Sinus bradycardia 12/31/2022   Right foot  ulcer (HCC) 12/31/2022   Acute ischemic stroke (HCC) 12/31/2022   Antiplatelet or antithrombotic long-term use 12/31/2022   Frequent PVCs 12/30/2022   Acute encephalopathy 12/30/2022   Hypokalemia 08/30/2022   Acute metabolic encephalopathy 08/30/2022   UTI (urinary tract infection) 08/30/2022   Hypomagnesemia 08/30/2022   Acute renal failure (ARF) (HCC) 08/29/2022   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   Abnormal findings on esophagogastroduodenoscopy (EGD) 10/02/2020   Constipation 10/02/2020   Anemia 10/02/2020   History of seizure disorder 10/02/2020   History of stroke 10/02/2020   History of colon polyps 10/02/2020   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 10/02/2020   Endotracheally intubated 09/16/2020   Community acquired pneumonia 09/16/2020   Cervical spondylosis 09/16/2020   Vertebral artery stenosis 09/16/2020   Status epilepticus (HCC) 09/16/2020   CKD (chronic kidney disease) stage 3, GFR 30-59 ml/min (HCC) 09/16/2020   Occult blood in stools    Leukocytosis    Hyponatremia    Hypertension    Dysphagia, post-stroke    Dysesthesia    Neuropathic pain    Brainstem infarct, acute (HCC)    Acute blood loss anemia    Acute renal failure superimposed on stage 3b chronic kidney disease (HCC)    Stroke-like symptoms 06/27/2020   Right leg weakness    Left carotid stenosis 04/13/2014   PCP:  Hildred Priest, FNP Pharmacy:   [id number] [place], [place] - [address] AT [facility] [address] [place] [address] Phone: [phone] Fax: [phone]  [facility], [place] - [address] [place] [address] Phone: [phone] Fax: [phone]     Social Determinants of Health (SDOH) Social History: SDOH Screenings   Food Insecurity: No Food Insecurity (01/01/2023)  Housing: Low Risk  (01/01/2023)  Transportation Needs: No Transportation Needs (01/01/2023)  Utilities:  Not At Risk (01/01/2023)  Alcohol Screen: Low Risk  (03/31/2022)  Depression (PHQ2-9): Low Risk  (04/15/2022)  Financial Resource Strain: Low Risk  (03/31/2022)  Physical Activity: Insufficiently Active (10/21/2022)  Social Connections: Socially Isolated (03/31/2022)  Stress: Stress Concern Present (10/21/2022)  Tobacco Use: High Risk (01/03/2023)   SDOH Interventions:     Readmission Risk Interventions     No data to display

## 2023-01-03 NOTE — Anesthesia Preprocedure Evaluation (Signed)
Anesthesia Evaluation  Patient identified by MRN, date of birth, ID band Patient confused    Reviewed: Allergy & Precautions, NPO status , Patient's Chart, lab work & pertinent test results  History of Anesthesia Complications Negative for: history of anesthetic complications  Airway Mallampati: III  TM Distance: >3 FB Neck ROM: Full    Dental  (+) Edentulous Upper, Edentulous Lower   Pulmonary Recent URI , Residual Cough, Current Smoker and Patient abstained from smoking. Covid positive  Intermittent cough   + rhonchi        Cardiovascular hypertension, Pt. on medications + CAD and +CHF   Rhythm:Regular     Neuro/Psych Seizures -,  CVA, Residual Symptoms  negative psych ROS   GI/Hepatic negative GI ROS, Neg liver ROS,,,  Endo/Other    Renal/GU CRFRenal diseaseHypokalemia  Lab Results      Component                Value               Date                      NA                       136                 01/03/2023                K                        2.9 (L)             01/03/2023                CO2                      21 (L)              01/03/2023                GLUCOSE                  96                  01/03/2023                BUN                      7 (L)               01/03/2023                CREATININE               1.03                01/03/2023                CALCIUM                  7.6 (L)             01/03/2023                EGFR                     23 (L)  08/26/2022                GFRNONAA                 >60                 01/03/2023                Musculoskeletal  (+) Arthritis ,    Abdominal   Peds  Hematology  (+) Blood dyscrasia, anemia Lab Results      Component                Value               Date                      WBC                      8.9                 01/03/2023                HGB                      7.8 (L)             01/03/2023                 HCT                      25.4 (L)            01/03/2023                MCV                      76.5 (L)            01/03/2023                PLT                      236                 01/03/2023              Anesthesia Other Findings   Reproductive/Obstetrics                             Anesthesia Physical Anesthesia Plan  ASA: 4  Anesthesia Plan: MAC   Post-op Pain Management: Minimal or no pain anticipated   Induction: Intravenous  PONV Risk Score and Plan: 1 and Propofol infusion  Airway Management Planned: Nasal Cannula and Natural Airway  Additional Equipment: None  Intra-op Plan:   Post-operative Plan:   Informed Consent:   Plan Discussed with: CRNA  Anesthesia Plan Comments:        Anesthesia Quick Evaluation

## 2023-01-03 NOTE — Interval H&P Note (Signed)
History and Physical Interval Note:  01/03/2023 9:53 AM  Andres White  has presented today for surgery, with the diagnosis of IDA, Acute on chronic anemia, Chronic antiPLT therapy.  The various methods of treatment have been discussed with the patient and family. After consideration of risks, benefits and other options for treatment, the patient has consented to  Procedure(s): ENTEROSCOPY (N/A) GIVENS CAPSULE STUDY (N/A) as a surgical intervention.  The patient's history has been reviewed, patient examined, no change in status, stable for surgery.  I have reviewed the patient's chart and labs.  Questions were answered to the patient's satisfaction.     [employer]

## 2023-01-04 DIAGNOSIS — G9340 Encephalopathy, unspecified: Secondary | ICD-10-CM | POA: Diagnosis not present

## 2023-01-04 LAB — BASIC METABOLIC PANEL
Anion gap: 7 (ref 5–15)
BUN: 8 mg/dL (ref 8–23)
CO2: 21 mmol/L — ABNORMAL LOW (ref 22–32)
Calcium: 7.7 mg/dL — ABNORMAL LOW (ref 8.9–10.3)
Chloride: 109 mmol/L (ref 98–111)
Creatinine, Ser: 1.01 mg/dL (ref 0.61–1.24)
GFR, Estimated: >60 mL/min (ref >60)
Glucose, Bld: 96 mg/dL (ref 70–99)
Potassium: 3.4 mmol/L — ABNORMAL LOW (ref 3.5–5.1)
Sodium: 137 mmol/L (ref 135–145)

## 2023-01-04 LAB — PHOSPHORUS: Phosphorus: 2.4 mg/dL — ABNORMAL LOW (ref 2.5–4.6)

## 2023-01-04 LAB — CBC
HCT: 25.8 % — ABNORMAL LOW (ref 39.0–52.0)
Hemoglobin: 7.8 g/dL — ABNORMAL LOW (ref 13.0–17.0)
MCH: 23.4 pg — ABNORMAL LOW (ref 26.0–34.0)
MCHC: 30.2 g/dL (ref 30.0–36.0)
MCV: 77.5 fL — ABNORMAL LOW (ref 80.0–100.0)
Platelets: 238 10*3/uL (ref 150–400)
RBC: 3.33 MIL/uL — ABNORMAL LOW (ref 4.22–5.81)
RDW: 22 % — ABNORMAL HIGH (ref 11.5–15.5)
WBC: 9.3 10*3/uL (ref 4.0–10.5)
nRBC: 0.4 % — ABNORMAL HIGH (ref 0.0–0.2)

## 2023-01-04 LAB — CULTURE, BLOOD (ROUTINE X 2)

## 2023-01-04 LAB — MAGNESIUM: Magnesium: 1.7 mg/dL (ref 1.7–2.4)

## 2023-01-04 MED ORDER — POTASSIUM CHLORIDE 20 MEQ PO PACK
40.0000 meq | PACK | Freq: Every day | ORAL | Status: DC
  Administered 2023-01-04 – 2023-01-05 (×2): 40 meq via ORAL
  Filled 2023-01-04 (×2): qty 2

## 2023-01-04 MED ORDER — FERROUS SULFATE 220 (44 FE) MG/5ML PO SOLN: 220.0000 mg | Freq: Every day | ORAL | 1 refills | Status: DC

## 2023-01-04 MED ORDER — FERROUS SULFATE 300 (60 FE) MG/5ML PO SOLN
300.0000 mg | Freq: Every day | ORAL | Status: DC
  Administered 2023-01-05: 300 mg via ORAL
  Filled 2023-01-04: qty 5

## 2023-01-04 MED ORDER — ENSURE ENLIVE PO LIQD: 237.0000 mL | Freq: Three times a day (TID) | ORAL | 30 refills | Status: DC

## 2023-01-04 MED ORDER — K PHOS MONO-SOD PHOS DI & MONO 155-852-130 MG PO TABS
500.0000 mg | ORAL_TABLET | Freq: Three times a day (TID) | ORAL | Status: DC
  Administered 2023-01-04 – 2023-01-05 (×4): 500 mg via ORAL
  Filled 2023-01-04 (×3): qty 2

## 2023-01-04 MED ORDER — POTASSIUM CHLORIDE 20 MEQ PO PACK: 40.0000 meq | PACK | Freq: Every day | ORAL | 0 refills | Status: AC

## 2023-01-04 MED ORDER — K PHOS MONO-SOD PHOS DI & MONO 155-852-130 MG PO TABS: 500.0000 mg | ORAL_TABLET | Freq: Three times a day (TID) | ORAL | 0 refills | Status: AC

## 2023-01-04 MED ORDER — ASPIRIN EC 81 MG PO TBEC: 81.0000 mg | DELAYED_RELEASE_TABLET | Freq: Every day | ORAL | 3 refills | Status: AC

## 2023-01-04 MED ORDER — FERROUS SULFATE 220 (44 FE) MG/5ML PO SOLN
220.0000 mg | Freq: Every day | ORAL | Status: DC
  Administered 2023-01-04: 220 mg via ORAL
  Filled 2023-01-04 (×2): qty 5

## 2023-01-04 MED ORDER — FERROUS SULFATE 300 (60 FE) MG/5ML PO SOLN: 300.0000 mg | Freq: Every day | ORAL | 3 refills | Status: AC

## 2023-01-04 MED ORDER — POLYETHYLENE GLYCOL 3350 17 G PO PACK: 17.0000 g | PACK | Freq: Every day | ORAL | 0 refills | Status: DC

## 2023-01-04 MED ORDER — SUCRALFATE 1 G PO TABS: 1.0000 g | ORAL_TABLET | Freq: Two times a day (BID) | ORAL | 0 refills | Status: AC

## 2023-01-04 NOTE — Discharge Summary (Incomplete)
Physician Discharge Summary  MAMOUN SPEARMAN ZOX:[id number] DOB: [birth year] DOA: 12/30/2022  PCP: Hildred Priest, FNP  Admit date: 12/30/2022 Discharge date: 01/05/2023  Admitted From: Home Disposition:  Home  Discharge Condition:Stable CODE STATUS:FULL Diet recommendation: Dysphagia 3  Brief/Interim Summary: Patient is a 77 year old male with history of dementia, BPH stroke with right-sided weakness, seizure disorder, hypertension, peripheral artery disease, diastolic CHF, carotid stenosis, bradycardia who presented with confusion .  As per the report, patient was lethargic for 4 days.  On presentation, he was bradycardic, hypotensive.  Lab work on presentation showed potassium of 2.2, hemoglobin in the range of 5.  MRI showed acute to subacute ischemic stroke.  GI, cardiology, neurology, palliative care were consulted.GI did small bowel enteroscopy on 6/2  with finding of AVMs.  PT/OT recommending SNF but daughter wants to take him home.  Palliative care was following for goals of care, remains full code with full scope of treatment.  High risk for readmission in the near future due to his multiple comorbidities  Following problems were addressed during the hospitalization:  Acute encephalopathy: History of known dementia.  More confused from baseline.  Was lethargic for 4 days.  Multifactorial but likely from stroke.Currently he is oriented to place only .  Obeys commands.  Has generalized weakness.PT/OT recommending SNF on discharge,daughter wants to take him home.  As per the daughter , now he is back to his baseline mental status.   Acute/Subacute stroke: MRI showed  acute to subacute lacunar infarct of the Right lower brainstem :Pontomedullary junction and right medullary pyramid.  [name]  Has history of stroke.  Ambulates with walker/wheelchair.  PT/OT/speech evaluation.  Initiated stroke workup.  Neurology recommending to continue  antiplatelet monotherapy .  Resume aspirin on 6/4   H/O  seizure.  Seizure could not be ruled out.  Spot EEG did not show any seizure or epileptiform activity.   Neurology recommended to continue home Keppra   Severe anemia/GI bleed: Hemoglobin in the range of 5 on presentation.  Transfused with 2 units of PRBC.  FOBT positive.  GI consulted .Patient has history of chronic gastritis.No hematochezia or melena as per the daughter.  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  GI recommending Protonix and Carafate. Iron studies showed low iron,  given IV iron.  Continue oral iron supplementation on discharge.   AKI on CKD stage IIIb: Foley catheter placement at Emergency Department for urinary retention.  Presented with creatinine of 1.6, kidney function normalised.  Foley removed today before discharge, he voided.  Continue Flomax   Frequent PVCs/bradycardia/prolonged QTc: Cardiology consulted.  Has chronic sinus bradycardia.  Cardiology signed off Patient has prolonged QT.    TSH normal.  Follow-up EKG shows persistent atrial fibrillation 550.Check EKG as outpatient in a week   Covid: This could also  have attributed to weakness.  Currently on room air.  Continue supportive care.  Not a started on steroids or antivirals.   Right foot ulcer: appears to be noninfected.  Wound care consulted and following   Goals of care: Patient has multiple comorbidities.  Has has poor oral intake.  Due to his advanced age and poor prognosis, he might be a candidate for hospice.  Palliative care following discussed with family.  Family wants to keep him full code and continue full scope of treatment.  Daughter wants to take him home   Discharge Diagnoses:  Principal Problem:   Acute encephalopathy Active Problems:   Left  carotid stenosis   Stroke-like symptoms   Acute blood loss anemia   Acute renal failure superimposed on stage 3b chronic kidney disease (HCC)   Hypertension   CKD (chronic kidney disease) stage 3, GFR 30-59 ml/min (HCC)    Duodenal adenoma   History of seizure disorder   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   Hypokalemia   Acute metabolic encephalopathy   UTI (urinary tract infection)   Hypomagnesemia   Frequent PVCs   Prolonged QT interval   Acute urinary retention   COVID-19 virus infection   Sinus bradycardia   Right foot ulcer (HCC)   Acute ischemic stroke (HCC)   Antiplatelet or antithrombotic long-term use   Acute on chronic anemia   Heme positive stool    Discharge Instructions  Discharge Instructions     Diet general   Complete by: As directed    Dysphagia  3   Discharge instructions   Complete by: As directed    1)Please take prescribed medications as instructed 2)Follow up with neurology as an outpatient.  Name and number the provider has been attached 3)Follow up with your PCP in a week.  Do a CBC, BMP-during the follow-up.Also do an EKG to check QTC interval 4)Follow up with home health services   Discharge wound care:   Complete by: As directed    As per wound care   Increase activity slowly   Complete by: As directed       Allergies as of 01/05/2023   No Known Allergies      Medication List     STOP taking these medications    amLODipine 10 MG tablet Commonly known as: NORVASC   cilostazol 50 MG tablet Commonly known as: PLETAL   levofloxacin 500 MG tablet Commonly known as: LEVAQUIN   lisinopril-hydrochlorothiazide 10-12.5 MG tablet Commonly known as: ZESTORETIC       TAKE these medications    acetaminophen 325 MG tablet Commonly known as: TYLENOL Take 1-2 tablets (325-650 mg total) by mouth every 4 (four) hours as needed for mild pain.   allopurinol 100 MG tablet Commonly known as: ZYLOPRIM Take 1 tablet (100 mg total) by mouth daily. TAKE 1 TABLET(100 MG) BY MOUTH DAILY   aspirin EC 81 MG tablet Take 1 tablet (81 mg total) by mouth daily. Resume on 6/4 What changed: additional instructions   atorvastatin 40 MG tablet Commonly known as:  LIPITOR TAKE 1 TABLET BY MOUTH ONCE DAILY   citalopram 10 MG tablet Commonly known as: CELEXA Take 10 mg by mouth daily.   colchicine 0.6 MG tablet Take 0.6 mg by mouth daily.   cyanocobalamin 1000 MCG tablet Commonly known as: VITAMIN B12 Take 1,000 mcg by mouth daily.   feeding supplement Liqd Take 237 mLs by mouth 3 (three) times daily between meals.   ferrous sulfate 300 (60 Fe) MG/5ML syrup Take 5 mLs (300 mg total) by mouth daily with breakfast.   furosemide 20 MG tablet Commonly known as: LASIX Take 1 tablet (20 mg total) by mouth every morning. What changed: when to take this   hydrocerin Crea Apply 1 application. topically 2 (two) times daily. What changed: additional instructions   levETIRAcetam 500 MG tablet Commonly known as: KEPPRA Take 1 tablet (500 mg total) by mouth 2 (two) times daily.   multivitamin with minerals Tabs tablet Take 1 tablet by mouth daily. What changed: when to take this   pantoprazole 40 MG tablet Commonly known as: PROTONIX TAKE 1 TABLET BY MOUTH TWICE  DAILY   phosphorus 155-852-130 MG tablet Commonly known as: K PHOS NEUTRAL Take 2 tablets (500 mg total) by mouth 3 (three) times daily for 7 days.   polyethylene glycol 17 g packet Commonly known as: MIRALAX / GLYCOLAX Take 17 g by mouth daily.   potassium chloride 20 MEQ packet Commonly known as: KLOR-CON Take 40 mEq by mouth daily for 14 days.   QUEtiapine 50 MG tablet Commonly known as: SEROQUEL Take 50 mg by mouth at bedtime.   sucralfate 1 g tablet Commonly known as: CARAFATE Take 1 tablet (1 g total) by mouth 2 (two) times daily.   tamsulosin 0.4 MG Caps capsule Commonly known as: FLOMAX Take 1 capsule (0.4 mg total) by mouth daily after supper.   traMADol 50 MG tablet Commonly known as: Ultram Take 1 tablet (50 mg total) by mouth 2 (two) times daily as needed.               Discharge Care Instructions  (From admission, onward)           Start      Ordered   01/04/23 0000  Discharge wound care:       Comments: As per wound care   01/04/23 1125            Follow-up Information     Ihor Austin, NP. Go on 03/11/2023.   Specialty: Neurology Why: stroke clinic Contact information: [address] [place] [address] [phone]         [facility] Follow up.   Specialty: Home Health Services Why: for home health services Contact information: [address] [place] [address] [phone]         Hildred Priest, FNP. Schedule an appointment as soon as possible for a visit in 1 week(s).   Specialty: Family Medicine Contact information: [address] [place] [address] [phone]                No Known Allergies  Consultations: Palliative care, cardiology,neurology,GI   Procedures/Studies: CT ANGIO HEAD NECK W WO CM  Result Date: 01/01/2023 CLINICAL DATA:  Stroke, follow-up. EXAM: CT ANGIOGRAPHY HEAD AND NECK WITH AND WITHOUT CONTRAST TECHNIQUE: Precontrast CT of the head. Subsequently, multidetector CT imaging of the head and neck was performed using the standard protocol during bolus administration of intravenous contrast. Multiplanar CT image reconstructions and MIPs were obtained to evaluate the vascular anatomy. Carotid stenosis measurements (when applicable) are obtained utilizing NASCET criteria, using the distal internal carotid diameter as the denominator. RADIATION DOSE REDUCTION: This exam was performed according to the departmental dose-optimization program which includes automated exposure control, adjustment of the mA and/or kV according to patient size and/or use of iterative reconstruction technique. CONTRAST:  75mL OMNIPAQUE IOHEXOL 350 MG/ML SOLN COMPARISON:  MRI brain 12/31/2022.  CTA head/neck 09/16/2020. FINDINGS: CT HEAD FINDINGS Brain: No acute hemorrhage. Unchanged severe chronic small-vessel disease with old infarcts in the left frontal  operculum, bilateral parietal lobes and left cerebellar hemisphere. The acute lacunar infarct seen on previous day's brain MRI is not resolved by CT. No hydrocephalus or extra-axial collection. Vascular: No hyperdense vessel or unexpected calcification. Skull: No calvarial fracture or suspicious bone lesion. Skull base is unremarkable. Sinuses/Orbits: Unremarkable. Other: None. Review of the MIP images confirms the above findings CTA NECK FINDINGS Aortic arch: Three-vessel arch configuration. Atherosclerotic calcifications of the aortic arch and arch vessel origins. Arch vessel origins are patent. Right carotid system:  No evidence of dissection, stenosis (50% or greater), or occlusion. Extensive, circumferential calcified plaque of the right carotid bulb and proximal right cervical ICA. Left carotid system: Approximately 80% stenosis of the proximal left cervical ICA (image 73 series 10), progressed from February 2022. Vertebral arteries: Left dominant. Hypoplastic right vertebral artery is not visualized from its origin to the distal V2 segment where it reconstitutes via collateral flow. Severe stenosis of the left vertebral artery at its origin. Skeleton: Multilevel cervical spondylosis, worst at C5-6, where there is at least moderate spinal canal stenosis. Other neck: Unremarkable. Upper chest: Unremarkable. Review of the MIP images confirms the above findings CTA HEAD FINDINGS Anterior circulation: Calcified plaque along the carotid siphons without hemodynamically significant stenosis. The ACAs and MCAs are patent proximally. 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 Distal branches are symmetric. Posterior circulation: Normal basilar artery. The SCAs, AICAs and PICAs are patent proximally. The PCAs are patent proximally without stenosis or aneurysm. Distal branches are symmetric. Venous sinuses: As permitted by contrast timing, patent. Anatomic variants: Persistent fetal origin of the bilateral PCAs  with hypoplastic P1 segments. Review of the MIP images confirms the above findings IMPRESSION: 1. Approximately 80% stenosis of the proximal left cervical ICA, progressed from February 2022. 2. Hypoplastic right vertebral artery is not visualized from its origin to the distal V2 segment where it reconstitutes via collateral flow. Severe stenosis of the left vertebral artery at its origin. 3. No intracranial large vessel occlusion or hemodynamically significant stenosis. 4. 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 Aortic Atherosclerosis (ICD10-I70.0). Electronically Signed   By: Orvan Falconer M.D.   On: 01/01/2023 18:29   VAS [place] LOWER EXTREMITY VENOUS (DVT)  Result Date: 12/31/2022  Lower Venous DVT Study Patient Name:  RAMESH STHILAIRE  Date of Exam:   12/31/2022 Medical Rec #: [id number]       Accession #:    [id number] Date of Birth: Apr 26, 1946       Patient Gender: M Patient Age:   61 years Exam Location:  [facility] Procedure:      VAS [place] LOWER EXTREMITY VENOUS (DVT) Referring Phys: Jonny Ruiz DOUTOVA --------------------------------------------------------------------------------  Indications: Swelling.  Risk Factors: COVID 19 positive. Comparison Study: No prior studies. Performing Technologist: Chanda Busing RVT  Examination Guidelines: A complete evaluation includes B-mode imaging, spectral Doppler, color Doppler, and power Doppler as needed of all accessible portions of each vessel. Bilateral testing is considered an integral part of a complete examination. Limited examinations for reoccurring indications may be performed as noted. The reflux portion of the exam is performed with the patient in reverse Trendelenburg.  +-----+---------------+---------+-----------+----------+--------------+ RIGHTCompressibilityPhasicitySpontaneityPropertiesThrombus Aging +-----+---------------+---------+-----------+----------+--------------+ CFV  Full           Yes      Yes                                  +-----+---------------+---------+-----------+----------+--------------+   +---------+---------------+---------+-----------+----------+--------------+ LEFT     CompressibilityPhasicitySpontaneityPropertiesThrombus Aging +---------+---------------+---------+-----------+----------+--------------+ CFV      Full           Yes      Yes                                 +---------+---------------+---------+-----------+----------+--------------+ SFJ      Full                                                        +---------+---------------+---------+-----------+----------+--------------+  FV Prox  Full                                                        +---------+---------------+---------+-----------+----------+--------------+ FV Mid   Full                                                        +---------+---------------+---------+-----------+----------+--------------+ FV DistalFull                                                        +---------+---------------+---------+-----------+----------+--------------+ PFV      Full                                                        +---------+---------------+---------+-----------+----------+--------------+ POP      Full           Yes      Yes                                 +---------+---------------+---------+-----------+----------+--------------+ PTV      Full                                                        +---------+---------------+---------+-----------+----------+--------------+ PERO     Full                                                        +---------+---------------+---------+-----------+----------+--------------+    Summary: RIGHT: - No evidence of common femoral vein obstruction.  LEFT: - There is no evidence of deep vein thrombosis in the lower extremity.  - No cystic structure found in the popliteal fossa.  *See table(s) above for measurements and observations.  Electronically signed by Coral Else MD on 12/31/2022 at 5:04:29 PM.    Final    ECHOCARDIOGRAM COMPLETE  Result Date: 12/31/2022    ECHOCARDIOGRAM REPORT   Patient Name:   SAAHIL KUREK Date of Exam: 12/31/2022 Medical Rec #:  [id number]      Height:       68.0 in Accession #:    [id number]     Weight:       164.9 lb Date of Birth:  1946/03/29      BSA:          1.883 m Patient Age:    77 years       BP:           122/52 mmHg Patient  Gender: M              HR:           50 bpm. Exam Location:  Inpatient Procedure: 2D Echo, Cardiac Doppler and Color Doppler Indications:    Stroke  History:        Patient has prior history of Echocardiogram examinations, most                 recent 06/25/2020. CHF, Carotid Disease, CKD, Stroke and PAD,                 Arrythmias:PVC and Bradycardia; Risk Factors:Hypertension and                 Current Smoker.  Sonographer:    Wallie Char Referring Phys: [id number] ANASTASSIA DOUTOVA IMPRESSIONS  1. Left ventricular ejection fraction, by estimation, is 60 to 65%. The left ventricle has normal function. The left ventricle has no regional wall motion abnormalities. There is moderate asymmetric left ventricular hypertrophy of the inferior segment. Left ventricular diastolic parameters are indeterminate.  2. Right ventricular systolic function is normal. The right ventricular size is normal.  3. The mitral valve is grossly normal. No evidence of mitral valve regurgitation. No evidence of mitral stenosis.  4. The aortic valve is tricuspid. There is mild calcification of the aortic valve. Aortic valve regurgitation is not visualized. Aortic valve sclerosis is present, with no evidence of aortic valve stenosis. Comparison(s): No significant change from prior study. FINDINGS  Left Ventricle: Left ventricular ejection fraction, by estimation, is 60 to 65%. The left ventricle has normal function. The left ventricle has no regional wall motion abnormalities. The left ventricular internal  cavity size was normal in size. There is  moderate asymmetric left ventricular hypertrophy of the inferior segment. Left ventricular diastolic parameters are indeterminate. Right Ventricle: The right ventricular size is normal. No increase in right ventricular wall thickness. Right ventricular systolic function is normal. Left Atrium: Left atrial size was normal in size. Right Atrium: Right atrial size was normal in size. Pericardium: Trivial pericardial effusion is present. The pericardial effusion is circumferential. Mitral Valve: The mitral valve is grossly normal. No evidence of mitral valve regurgitation. No evidence of mitral valve stenosis. MV peak gradient, 3.3 mmHg. The mean mitral valve gradient is 1.0 mmHg. Tricuspid Valve: The tricuspid valve is normal in structure. Tricuspid valve regurgitation is not demonstrated. No evidence of tricuspid stenosis. Aortic Valve: The aortic valve is tricuspid. There is mild calcification of the aortic valve. There is mild aortic valve annular calcification. Aortic valve regurgitation is not visualized. Aortic valve sclerosis is present, with no evidence of aortic valve stenosis. Aortic valve mean gradient measures 4.0 mmHg. Aortic valve peak gradient measures 5.9 mmHg. Aortic valve area, by VTI measures 2.01 cm. Pulmonic Valve: The pulmonic valve was normal in structure. Pulmonic valve regurgitation is not visualized. No evidence of pulmonic stenosis. Aorta: The aortic root and ascending aorta are structurally normal, with no evidence of dilitation. IAS/Shunts: No atrial level shunt detected by color flow Doppler.  LEFT VENTRICLE PLAX 2D LVIDd:         4.80 cm     Diastology LVIDs:         3.10 cm     LV e' medial:    7.19 cm/s LV PW:         1.30 cm     LV E/e' medial:  11.2 LV IVS:        1.10  cm     LV e' lateral:   6.65 cm/s LVOT diam:     1.90 cm     LV E/e' lateral: 12.1 LV SV:         57 LV SV Index:   30 LVOT Area:     2.84 cm  LV Volumes (MOD) LV vol d, MOD  A2C: 54.6 ml LV vol d, MOD A4C: 84.4 ml LV vol s, MOD A2C: 23.7 ml LV vol s, MOD A4C: 35.8 ml LV SV MOD A2C:     30.9 ml LV SV MOD A4C:     84.4 ml LV SV MOD BP:      42.2 ml RIGHT VENTRICLE RV Basal diam:  3.00 cm RV S prime:     11.40 cm/s TAPSE (M-mode): 2.3 cm LEFT ATRIUM             Index        RIGHT ATRIUM           Index LA diam:        3.70 cm 1.97 cm/m   RA Area:     12.60 cm LA Vol (A2C):   41.0 ml 21.78 ml/m  RA Volume:   24.20 ml  12.85 ml/m LA Vol (A4C):   45.3 ml 24.06 ml/m LA Biplane Vol: 43.4 ml 23.05 ml/m  AORTIC VALVE AV Area (Vmax):    2.08 cm AV Area (Vmean):   1.98 cm AV Area (VTI):     2.01 cm AV Vmax:           121.50 cm/s AV Vmean:          89.050 cm/s AV VTI:            0.283 m AV Peak Grad:      5.9 mmHg AV Mean Grad:      4.0 mmHg LVOT Vmax:         89.10 cm/s LVOT Vmean:        62.200 cm/s LVOT VTI:          0.201 m LVOT/AV VTI ratio: 0.71  AORTA Ao Root diam: 3.40 cm Ao Asc diam:  3.50 cm MITRAL VALVE               TRICUSPID VALVE MV Area (PHT): 3.59 cm    TR Peak grad:   22.7 mmHg MV Area VTI:   1.58 cm    TR Vmax:        238.00 cm/s MV Peak grad:  3.3 mmHg MV Mean grad:  1.0 mmHg    SHUNTS MV Vmax:       0.90 m/s    Systemic VTI:  0.20 m MV Vmean:      46.8 cm/s   Systemic Diam: 1.90 cm MV Decel Time: 212 msec MV E velocity: 80.65 cm/s MV A velocity: 98.00 cm/s MV E/A ratio:  0.82 Riley Lam MD Electronically signed by Riley Lam MD Signature Date/Time: 12/31/2022/2:43:57 PM    Final    [place] EKG SITE RITE  Result Date: 12/31/2022 If Site Rite image not attached, placement could not be confirmed due to current cardiac rhythm.  EEG adult  Result Date: 12/31/2022 Charlsie Quest, MD     12/31/2022  1:43 PM Patient Name: TRAYLON OTTENS MRN: [id number] Epilepsy Attending: Charlsie Quest Referring Physician/Provider: Caryl Pina, MD Date: 12/31/2022 Duration: 23.45 mins Patient history:  77 year old male with history of dementia, BPH stroke with  right-sided weakness, seizure disorder, hypertension, peripheral  artery disease, diastolic CHF, carotid stenosis, bradycardia who presented with confusion. EEG to evaluate for seizure. Level of alertness: Awake AEDs during EEG study: LEV Technical aspects: This EEG study was done with scalp electrodes positioned according to the 10-20 International system of electrode placement. Electrical activity was reviewed with band pass filter of 1-70Hz , sensitivity of 7 uV/mm, display speed of 24mm/sec with a 60Hz  notched filter applied as appropriate. EEG data were recorded continuously and digitally stored.  Video monitoring was available and reviewed as appropriate. Description: The posterior dominant rhythm consists of 7 Hz activity of moderate voltage (25-35 uV) seen predominantly in posterior head regions, symmetric and reactive to eye opening and eye closing. EEG showed continuous generalized 5 to 6 Hz theta slowing. Hyperventilation and photic stimulation were not performed.   ABNORMALITY - Continuous slow, generalized - Background slow IMPRESSION: This study is suggestive of mild to moderate diffuse encephalopathy, nonspecific etiology. No seizures or epileptiform discharges were seen throughout the recording. Charlsie Quest   MR BRAIN WO CONTRAST  Result Date: 12/31/2022 CLINICAL DATA:  77 year old male with delirium. Family reports left-side is weaker for the past 4 days. History of previous strokes, on aspirin and Plavix. EXAM: MRI HEAD WITHOUT CONTRAST TECHNIQUE: Multiplanar, multiecho pulse sequences of the brain and surrounding structures were obtained without intravenous contrast. COMPARISON:  Head CT this morning.  Previous brain MRI 09/20/2020. FINDINGS: Brain: Acute lacunar infarct of the right lower brainstem. This is best seen on the axial diffusion tracking from the paracentral pontomedullary junction on the right toward the right medullary pyramid. See series 3, images 10 and 11. There is  associated FLAIR hyperintensity. Subtle if any T1 signal correlation. No hemorrhage or mass effect is associated. No other restricted diffusion. Chronic infarcts with encephalomalacia in the bilateral cerebellum, bilateral thalami, right basal ganglia, posterior right MCA territory, anterior left MCA territory. Some of the cortical encephalomalacia is new compared to 2022. Minimal associated hemosiderin on SWI. Patchy and confluent other cerebral white matter T2 and FLAIR hyperintensity also appears mildly progressed. Cavum septum pellucidum, normal variant. No midline shift, mass effect, evidence of mass lesion, ventriculomegaly, extra-axial collection or acute intracranial hemorrhage. Cervicomedullary junction and pituitary are within normal limits. Vascular: Major intracranial vascular flow voids are stable since 2022. Skull and upper cervical spine: Visualized bone marrow signal is within normal limits. Chronic cervical spine disc degeneration. Sinuses/Orbits: Stable chronic orbital wall fractures better demonstrated by CT this morning. Paranasal Visualized paranasal sinuses and mastoids are stable and well aerated. Other: Grossly normal visible internal auditory structures. Negative visible scalp and face. IMPRESSION: 1. Acute to subacute lacunar infarct of the Right lower brainstem - Pontomedullary junction and right medullary pyramid. No associated hemorrhage or mass effect. 2. Underlying fairly advanced chronic ischemic disease with other progression since a 2022 MRI. Electronically Signed   By: Odessa Fleming M.D.   On: 12/31/2022 10:17   CT HEAD WO CONTRAST ( )  Result Date: 12/31/2022 CLINICAL DATA:  77 year old male with delirium. EXAM: CT HEAD WITHOUT CONTRAST TECHNIQUE: Contiguous axial images were obtained from the base of the skull through the vertex without intravenous contrast. RADIATION DOSE REDUCTION: This exam was performed according to the departmental dose-optimization program which includes  automated exposure control, adjustment of the mA and/or kV according to patient size and/or use of iterative reconstruction technique. COMPARISON:  Brain MRI 09/20/2020.  Head CT 08/29/2022. FINDINGS: Brain: Cavum septum pellucidum, normal variant. No ventriculomegaly. No midline shift, mass effect, or evidence of intracranial mass  lesion. No acute intracranial hemorrhage identified. Chronic encephalomalacia in the posterior right MCA territory with scattered small chronic infarcts in the bilateral thalami, bilateral cerebellum. Patchy and confluent bilateral cerebral white matter hypodensity. Stable gray-white matter differentiation throughout the brain. No cortically based acute infarct identified. Vascular: No suspicious intracranial vascular hyperdensity. Skull: Chronic lamina papyracea and left orbital floor fractures. No acute osseous abnormality identified. Sinuses/Orbits: Visualized paranasal sinuses and mastoids are stable and well aerated. Other: Chronic left orbital floor fracture. No acute orbit or scalp soft tissue finding identified. IMPRESSION: 1. No acute intracranial abnormality identified. Stable non contrast CT appearance of advanced chronic ischemic disease in the brain. 2. Chronic lamina papyracea and left orbital floor fractures. Electronically Signed   By: Odessa Fleming M.D.   On: 12/31/2022 05:10   DG Chest Portable 1 View  Result Date: 12/30/2022 CLINICAL DATA:  Lethargic Hypotension, lethargy and weakness x3 days. Seizure today. Hx of stroke, HTN. EXAM: PORTABLE CHEST 1 VIEW.  Patient is rotated. COMPARISON:  Chest x-ray 08/29/2022 FINDINGS: The heart and mediastinal contours are within normal limits. Aortic calcification. Developed retrocardiac airspace opacity. No pulmonary edema. No pleural effusion. No pneumothorax. No acute osseous abnormality. IMPRESSION: 1. Developing retrocardiac airspace opacity with limited evaluation due to patient rotation. Recommend repeat PA and lateral view of  the chest. 2.  Aortic Atherosclerosis (ICD10-I70.0). Electronically Signed   By: Tish Frederickson M.D.   On: 12/30/2022 19:50      Subjective: Patient seen and examined at bedside today.  Hemodynamically stable.  Comfortable.  Remains on room air.  Alert and awake, oriented to place and tells current month.  Not in any kind of distress.  Long discussion held with daughter on phone about discharge planning on 6/2.  Discharge Exam: Vitals:   01/05/23 0400 01/05/23 0830  BP: 121/69 (!) 142/80  Pulse: 61 64  Resp:  19  Temp:    SpO2: 99% 95%   Vitals:   01/04/23 1900 01/04/23 2344 01/05/23 0400 01/05/23 0830  BP: 129/76 139/78 121/69 (!) 142/80  Pulse:  61 61 64  Resp: 18   19  Temp: 98.5 F (36.9 C) 98.5 F (36.9 C)    TempSrc: Oral Axillary Oral   SpO2: 98% 98% 99% 95%  Weight:      Height:        General: Pt is alert, awake, not in acute distress, very weak and deconditioned, left facial droop, left hemiplegia Cardiovascular: RRR, S1/S2 +, no rubs, no gallops Respiratory: CTA bilaterally, no wheezing, no rhonchi Abdominal: Soft, NT, ND, bowel sounds + Extremities: no edema, no cyanosis    The results of significant diagnostics from this hospitalization (including imaging, microbiology, ancillary and laboratory) are listed below for reference.     Microbiology: Recent Results (from the past 240 hour(s))  Culture, blood (Routine x 2)     Status: None   Collection Time: 12/30/22  6:46 PM   Specimen: BLOOD  Result Value Ref Range Status   Specimen Description BLOOD RIGHT ANTECUBITAL  Final   Special Requests   Final    BOTTLES DRAWN AEROBIC AND ANAEROBIC Blood Culture results may not be optimal due to an inadequate volume of blood received in culture bottles   Culture   Final    NO GROWTH 5 DAYS Performed at [facility], [address], [place], [place] [address]    Report Status 01/04/2023 FINAL  Final  Urine Culture     Status: Abnormal   Collection  Time:  12/30/22  9:06 PM   Specimen: Urine, Random  Result Value Ref Range Status   Specimen Description URINE, RANDOM  Final   Special Requests NONE Reflexed from [id number]  Final   Culture (A)  Final    <10,000 COLONIES/mL INSIGNIFICANT GROWTH Performed at [facility], [address], [place], [place] [address]    Report Status 12/31/2022 FINAL  Final  SARS Coronavirus 2 by RT PCR (hospital order, performed in [facility] hospital lab) *cepheid single result test* Anterior Nasal Swab     Status: Abnormal   Collection Time: 12/30/22  9:17 PM   Specimen: Anterior Nasal Swab  Result Value Ref Range Status   SARS Coronavirus 2 by RT PCR POSITIVE (A) NEGATIVE Final    Comment: Performed at [facility], [address], [place], [place] [address]  Culture, blood (Routine X 2) w Reflex to ID Panel     Status: None   Collection Time: 12/31/22  8:07 AM   Specimen: BLOOD LEFT HAND  Result Value Ref Range Status   Specimen Description BLOOD LEFT HAND  Final   Special Requests   Final    BOTTLES DRAWN AEROBIC ONLY Blood Culture results may not be optimal due to an inadequate volume of blood received in culture bottles   Culture   Final    NO GROWTH 5 DAYS Performed at [facility], [address] [place], [place] [address]    Report Status 01/05/2023 FINAL  Final     Labs: BNP (last 3 results) No results for input(s): "BNP" in the last 8760 hours. Basic Metabolic Panel: Recent Labs  Lab 12/31/22 1201 01/01/23 0832 01/02/23 0418 01/03/23 0329 01/03/23 0600 01/03/23 1601 01/04/23 0600 01/04/23 0850 01/05/23 0607  NA 136 136 136  --  136  --   --  137 138  K 3.6 2.7* 3.9  --  2.9* 5.2*  --  3.4* 3.6  CL 107 110 108  --  109  --   --  109 111  CO2 20* 19* 19*  --  21*  --   --  21* 22  GLUCOSE 85 100* 107*  --  96  --   --  96 91  BUN 10 8 8   --  7*  --   --  8 7*  CREATININE 1.35* 1.16 1.22  --  1.03  --   --  1.01 1.11  CALCIUM 7.9* 7.5* 7.7*  --  7.6*  --   --  7.7*  7.8*  MG 1.9 1.8 1.7 1.6*  --   --  1.7  --   --   PHOS 2.4* 2.8 2.1* 2.8  --   --  2.4*  --  3.2   Liver Function Tests: Recent Labs  Lab 12/30/22 1846 12/31/22 1201  AST 35 40  ALT 17 14  ALKPHOS 68 70  BILITOT 0.5 0.6  PROT 6.6 6.8  ALBUMIN 2.1* 2.3*   No results for input(s): "LIPASE", "AMYLASE" in the last 168 hours. No results for input(s): "AMMONIA" in the last 168 hours. CBC: Recent Labs  Lab 12/30/22 1846 12/31/22 1201 01/01/23 0832 01/02/23 1205 01/03/23 0600 01/04/23 0850  WBC 5.3 5.3 6.3 8.5 8.9 9.3  NEUTROABS 3.0  --   --   --   --   --   HGB 5.5* 9.9* 8.2* 7.7* 7.8* 7.8*  HCT 19.8* 31.8* 25.9* 25.6* 25.4* 25.8*  MCV 74.4* 76.8* 76.9* 76.4*  76.5* 77.5*  PLT 311 23* 266 251 236 238   Cardiac Enzymes: Recent Labs  Lab 12/30/22 1846  CKTOTAL 45*   BNP: Invalid input(s): "POCBNP" CBG: No results for input(s): "GLUCAP" in the last 168 hours. D-Dimer No results for input(s): "DDIMER" in the last 72 hours. Hgb A1c No results for input(s): "HGBA1C" in the last 72 hours. Lipid Profile No results for input(s): "CHOL", "HDL", "LDLCALC", "TRIG", "CHOLHDL", "LDLDIRECT" in the last 72 hours. Thyroid function studies No results for input(s): "TSH", "T4TOTAL", "T3FREE", "THYROIDAB" in the last 72 hours.  Invalid input(s): "FREET3" Anemia work up No results for input(s): "VITAMINB12", "FOLATE", "FERRITIN", "TIBC", "IRON", "RETICCTPCT" in the last 72 hours. Urinalysis    Component Value Date/Time   COLORURINE YELLOW 12/30/2022 2106   APPEARANCEUR HAZY (A) 12/30/2022 2106   APPEARANCEUR Cloudy (A) 08/26/2022 1407   LABSPEC 1.005 12/30/2022 2106   LABSPEC 1.010 03/23/2014 2009   PHURINE 6.0 12/30/2022 2106   GLUCOSEU NEGATIVE 12/30/2022 2106   GLUCOSEU Negative 03/23/2014 2009   HGBUR SMALL (A) 12/30/2022 2106   BILIRUBINUR NEGATIVE 12/30/2022 2106   BILIRUBINUR Negative 08/26/2022 1407   BILIRUBINUR Negative 03/23/2014 2009   KETONESUR NEGATIVE  12/30/2022 2106   PROTEINUR NEGATIVE 12/30/2022 2106   NITRITE NEGATIVE 12/30/2022 2106   LEUKOCYTESUR LARGE (A) 12/30/2022 2106   LEUKOCYTESUR Negative 03/23/2014 2009   Sepsis Labs Recent Labs  Lab 01/01/23 0832 01/02/23 1205 01/03/23 0600 01/04/23 0850  WBC 6.3 8.5 8.9 9.3   Microbiology Recent Results (from the past 240 hour(s))  Culture, blood (Routine x 2)     Status: None   Collection Time: 12/30/22  6:46 PM   Specimen: BLOOD  Result Value Ref Range Status   Specimen Description BLOOD RIGHT ANTECUBITAL  Final   Special Requests   Final    BOTTLES DRAWN AEROBIC AND ANAEROBIC Blood Culture results may not be optimal due to an inadequate volume of blood received in culture bottles   Culture   Final    NO GROWTH 5 DAYS Performed at [facility], [address], [place], [place] [address]    Report Status 01/04/2023 FINAL  Final  Urine Culture     Status: Abnormal   Collection Time: 12/30/22  9:06 PM   Specimen: Urine, Random  Result Value Ref Range Status   Specimen Description URINE, RANDOM  Final   Special Requests NONE Reflexed from [id number]  Final   Culture (A)  Final    <10,000 COLONIES/mL INSIGNIFICANT GROWTH Performed at [facility], [address] [place], [place] [address]    Report Status 12/31/2022 FINAL  Final  SARS Coronavirus 2 by RT PCR (hospital order, performed in [facility] hospital lab) *cepheid single result test* Anterior Nasal Swab     Status: Abnormal   Collection Time: 12/30/22  9:17 PM   Specimen: Anterior Nasal Swab  Result Value Ref Range Status   SARS Coronavirus 2 by RT PCR POSITIVE (A) NEGATIVE Final    Comment: Performed at [facility], [address], [place], [place] [address]  Culture, blood (Routine X 2) w Reflex to ID Panel     Status: None   Collection Time: 12/31/22  8:07 AM   Specimen: BLOOD LEFT HAND  Result Value Ref Range Status   Specimen Description BLOOD LEFT HAND  Final   Special Requests   Final     BOTTLES DRAWN AEROBIC ONLY Blood Culture results may not be optimal due to an  inadequate volume of blood received in culture bottles   Culture   Final    NO GROWTH 5 DAYS Performed at [facility] Lab, [address], [place], [place] [address]    Report Status 01/05/2023 FINAL  Final    Please note: You were cared for by a hospitalist during your hospital stay. Once you are discharged, your primary care physician will handle any further medical issues. Please note that NO REFILLS for any discharge medications will be authorized once you are discharged, as it is imperative that you return to your primary care physician (or establish a relationship with a primary care physician if you do not have one) for your post hospital discharge needs so that they can reassess your need for medications and monitor your lab values.    Time coordinating discharge: 40 minutes  SIGNED:   Burnadette Pop, MD  [facility] 01/05/2023, 10:21 AM Pager [phone]  If 7PM-7AM, please contact night-coverage [email or web address] Password [email or web address]

## 2023-01-04 NOTE — Progress Notes (Signed)
Patient still has not voided post catheter removal. Bladder scan volume 157cc. MD Adhikari notified. Order to in and out cath bladder scan greater than .

## 2023-01-04 NOTE — Progress Notes (Signed)
PROGRESS NOTE  Andres White  WUJ:[id number] DOB: 1945/11/23 DOA: 12/30/2022 PCP: Hildred Priest, FNP   Brief Narrative: Patient is a 77 year old male with history of dementia, BPH stroke with right-sided weakness, seizure disorder, hypertension, peripheral artery disease, diastolic CHF, carotid stenosis, bradycardia who presented with confusion .  As per the report, patient was lethargic for 4 days.  On presentation, he was bradycardic, hypotensive.  Lab work on presentation showed potassium of 2.2, hemoglobin in the range of 5.  MRI showed acute to subacute ischemic stroke.  GI, cardiology, neurology, palliative care were consulted.GI did small bowel enteroscopy on 6/2  with finding of AVMs.  PT/OT recommending SNF but daughter wants to take him home.  Palliative care was following for goals of care, remains full code with full scope of treatment    Assessment & Plan:  Principal Problem:   Acute encephalopathy Active Problems:   Left carotid stenosis   Stroke-like symptoms   Acute blood loss anemia   Acute renal failure superimposed on stage 3b chronic kidney disease (HCC)   Hypertension   CKD (chronic kidney disease) stage 3, GFR 30-59 ml/min (HCC)   Duodenal adenoma   History of seizure disorder   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   Hypokalemia   Acute metabolic encephalopathy   UTI (urinary tract infection)   Hypomagnesemia   Frequent PVCs   Prolonged QT interval   Acute urinary retention   COVID-19 virus infection   Sinus bradycardia   Right foot ulcer (HCC)   Acute ischemic stroke (HCC)   Antiplatelet or antithrombotic long-term use   Acute on chronic anemia   Heme positive stool    Acute encephalopathy: History of known dementia.  More confused from baseline.  Was lethargic for 4 days.  Multifactorial but likely from stroke.Currently he is oriented to place only .  Obeys commands.  Has generalized weakness.PT/OT recommending SNF on discharge,daughter wants to take  him home.  As per the daughter , now he is back to his baseline mental status.   Acute/Subacute stroke: MRI showed  acute to subacute lacunar infarct of the Right lower brainstem :Pontomedullary junction and right medullary pyramid.  [name]  Has history of stroke.  Ambulates with walker/wheelchair.  PT/OT/speech evaluation.  Initiated stroke workup.  Neurology recommending to continue  antiplatelet monotherapy .  Resume aspirin on 6/4   H/O seizure.  Seizure could not be ruled out.  Spot EEG did not show any seizure or epileptiform activity.   Neurology recommended to continue home Keppra   Severe anemia/GI bleed: Hemoglobin in the range of 5 on presentation.  Transfused with 2 units of PRBC.  FOBT positive.  GI consulted .Patient has history of chronic gastritis.No hematochezia or melena as per the daughter.  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  GI recommending Protonix and Carafate. Iron studies showed low iron,  given IV iron.  Continue oral iron supplementation on discharge.   AKI on CKD stage IIIb: Foley catheter placement at Emergency Department for urinary retention.  Presented with creatinine of 1.6, kidney function normalised.  Foley removed today before discharge, he voided.  Continue Flomax   Frequent PVCs/bradycardia/prolonged QTc: Cardiology consulted.  Has chronic sinus bradycardia.  Cardiology signed off Patient has prolonged QT.    TSH normal.  Follow-up EKG shows persistent atrial fibrillation 550.Check EKG as outpatient in a week   Covid: This could also  have attributed to weakness.  Currently on room air.  Continue supportive care.  Not a started on steroids or antivirals.   Right foot ulcer: appears to be noninfected.  Wound care consulted and following   Goals of care: Patient has multiple comorbidities.  Has has poor oral intake.  Due to his advanced age and poor prognosis, he might be a candidate for hospice.  Palliative care following discussed with  family.  Family wants to keep him full code and continue full scope of treatment.  Daughter wants to take him home  Foley was placed on admission, this has been removed, patient has not voided yet.Plan for dc tomorrow    Nutrition Problem: Increased nutrient needs Etiology: catabolic illness (HF, COVID, increased needs for wound healing)    DVT prophylaxis:SCDs Start: 12/30/22 2309     Code Status: Full Code  Family Communication: Discussed with daughter on phon eon 6/2  Patient status:Inpatient  Patient is from :Home  Anticipated discharge to: Home  Estimated DC date: Tomorrow   Consultants: Cardiology,GI, neurology  Procedures:None  Antimicrobials:  Anti-infectives (From admission, onward)    Start     Dose/Rate Route Frequency Ordered Stop   12/30/22 2345  cefTRIAXone (ROCEPHIN) 1 g in sodium chloride 0.9 % 100 mL IVPB  Status:  Discontinued        1 g 200 mL/hr over 30 Minutes Intravenous Daily at bedtime 12/30/22 2317 01/01/23 0928       Subjective: Patient seen and examined at bedside today.  Hemodynamically stable.  Comfortable.  Remains on room air.  Alert and awake, oriented to place and tells current month.  Not in any kind of distress. Foley has been removed but he hasnot voided yet  Objective: Vitals:   01/04/23 0449 01/04/23 0700 01/04/23 0847 01/04/23 1256  BP: 123/68  122/83 132/64  Pulse: 64  61 (!) 54  Resp: 18     Temp: 98.5 F (36.9 C)  98 F (36.7 C) (!) 97.4 F (36.3 C)  TempSrc: Oral  Oral Oral  SpO2: 100% 100% 98% 100%  Weight:      Height:        Intake/Output Summary (Last 24 hours) at 01/04/2023 1519 Last data filed at 01/04/2023 0847 Gross per 24 hour  Intake 120 ml  Output 300 ml  Net -180 ml   Filed Weights   12/31/22 0500 01/01/23 0427 01/03/23 0951  Weight: 74.8 kg 76 kg 76 kg    Examination:   General exam: Overall comfortable, not in distress,deconditioned, weak,left facial droop HEENT: PERRL Respiratory system:   no wheezes or crackles  Cardiovascular system: S1 & S2 heard, RRR.  Gastrointestinal system: Abdomen is nondistended, soft and nontender. Central nervous system: Alert and oriented to place only,left sided weakness Extremities: bilateral upper extremity edema edema, no clubbing ,no cyanosis Skin:right heel ulcer   Data Reviewed: I have personally reviewed following labs and imaging studies  CBC: Recent Labs  Lab 12/30/22 1846 12/31/22 1201 01/01/23 0832 01/02/23 1205 01/03/23 0600 01/04/23 0850  WBC 5.3 5.3 6.3 8.5 8.9 9.3  NEUTROABS 3.0  --   --   --   --   --   HGB 5.5* 9.9* 8.2* 7.7* 7.8* 7.8*  HCT 19.8* 31.8* 25.9* 25.6* 25.4* 25.8*  MCV 74.4* 76.8* 76.9* 76.4* 76.5* 77.5*  PLT 311 23* 266 251 236 238   Basic Metabolic Panel: Recent Labs  Lab 12/31/22 1201 01/01/23 0832 01/02/23 0418 01/03/23 0329 01/03/23 0600 01/03/23 1601 01/04/23 0600 01/04/23 0850  NA 136 136 136  --  136  --   --  137  K 3.6 2.7* 3.9  --  2.9* 5.2*  --  3.4*  CL 107 110 108  --  109  --   --  109  CO2 20* 19* 19*  --  21*  --   --  21*  GLUCOSE 85 100* 107*  --  96  --   --  96  BUN 10 8 8   --  7*  --   --  8  CREATININE 1.35* 1.16 1.22  --  1.03  --   --  1.01  CALCIUM 7.9* 7.5* 7.7*  --  7.6*  --   --  7.7*  MG 1.9 1.8 1.7 1.6*  --   --  1.7  --   PHOS 2.4* 2.8 2.1* 2.8  --   --  2.4*  --      Recent Results (from the past 240 hour(s))  Culture, blood (Routine x 2)     Status: None   Collection Time: 12/30/22  6:46 PM   Specimen: BLOOD  Result Value Ref Range Status   Specimen Description BLOOD RIGHT ANTECUBITAL  Final   Special Requests   Final    BOTTLES DRAWN AEROBIC AND ANAEROBIC Blood Culture results may not be optimal due to an inadequate volume of blood received in culture bottles   Culture   Final    NO GROWTH 5 DAYS Performed at [facility] Lab, [address], [place], [place] [address]    Report Status 01/04/2023 FINAL  Final  Urine Culture     Status: Abnormal    Collection Time: 12/30/22  9:06 PM   Specimen: Urine, Random  Result Value Ref Range Status   Specimen Description URINE, RANDOM  Final   Special Requests NONE Reflexed from [id number]  Final   Culture (A)  Final    <10,000 COLONIES/mL INSIGNIFICANT GROWTH Performed at [facility], [address], [place], [place] [address]    Report Status 12/31/2022 FINAL  Final  SARS Coronavirus 2 by RT PCR (hospital order, performed in [facility] hospital lab) *cepheid single result test* Anterior Nasal Swab     Status: Abnormal   Collection Time: 12/30/22  9:17 PM   Specimen: Anterior Nasal Swab  Result Value Ref Range Status   SARS Coronavirus 2 by RT PCR POSITIVE (A) NEGATIVE Final    Comment: Performed at [facility] Lab, [address] [place], [place] [address]  Culture, blood (Routine X 2) w Reflex to ID Panel     Status: None (Preliminary result)   Collection Time: 12/31/22  8:07 AM   Specimen: BLOOD LEFT HAND  Result Value Ref Range Status   Specimen Description BLOOD LEFT HAND  Final   Special Requests   Final    BOTTLES DRAWN AEROBIC ONLY Blood Culture results may not be optimal due to an inadequate volume of blood received in culture bottles   Culture   Final    NO GROWTH 4 DAYS Performed at [facility], [address], [place], [place] [address]    Report Status PENDING  Incomplete     Radiology Studies: No results found.  Scheduled Meds:  atorvastatin  40 mg Oral Daily   Chlorhexidine Gluconate Cloth  6 each Topical Q0600   citalopram  10 mg Oral Daily   feeding supplement  237 mL Oral TID BM   ferrous sulfate  220 mg Oral Daily   levETIRAcetam  500 mg Oral BID   multivitamin with minerals  1 tablet Oral Daily   pantoprazole  40 mg Oral BID   phosphorus  500 mg Oral TID   polyethylene glycol  17 g Oral Daily   potassium chloride  40 mEq Oral Daily   QUEtiapine  50 mg Oral QHS   senna  1 tablet Oral BID   sodium chloride flush  10-40 mL Intracatheter  Q12H   sucralfate  1 g Oral BID   tamsulosin  0.4 mg Oral QPC supper   Continuous Infusions:     LOS: 4 days   Burnadette Pop, MD [facility] P6/09/2022, 3:19 PM

## 2023-01-05 ENCOUNTER — Encounter (HOSPITAL_COMMUNITY): Payer: Self-pay | Admitting: Gastroenterology

## 2023-01-05 ENCOUNTER — Encounter: Payer: Medicare Other | Admitting: Physical Medicine and Rehabilitation

## 2023-01-05 DIAGNOSIS — G9340 Encephalopathy, unspecified: Secondary | ICD-10-CM | POA: Diagnosis not present

## 2023-01-05 LAB — PHOSPHORUS: Phosphorus: 3.2 mg/dL (ref 2.5–4.6)

## 2023-01-05 LAB — BASIC METABOLIC PANEL
Anion gap: 5 (ref 5–15)
BUN: 7 mg/dL — ABNORMAL LOW (ref 8–23)
CO2: 22 mmol/L (ref 22–32)
Calcium: 7.8 mg/dL — ABNORMAL LOW (ref 8.9–10.3)
Chloride: 111 mmol/L (ref 98–111)
Creatinine, Ser: 1.11 mg/dL (ref 0.61–1.24)
GFR, Estimated: >60 mL/min (ref >60)
Glucose, Bld: 91 mg/dL (ref 70–99)
Potassium: 3.6 mmol/L (ref 3.5–5.1)
Sodium: 138 mmol/L (ref 135–145)

## 2023-01-05 NOTE — Progress Notes (Signed)
SLP Cancellation Note  Patient Details Name: Andres White MRN: [id number] DOB: 30-Dec-1945   Cancelled treatment:       Reason Eval/Treat Not Completed: SLP screened, no needs identified, will sign off Per daughter, patient back to baseline from a cognitive-linguistic standpoint.  [name] MA, CCC-SLP   [name] 01/05/2023, 10:47 AM

## 2023-01-05 NOTE — Plan of Care (Signed)
  Problem: Education: Goal: Knowledge of General Education information will improve Description: Including pain rating scale, medication(s)/side effects and non-pharmacologic comfort measures Outcome: Adequate for Discharge   Careplan and AVS reviewed with patient's family. Transportation provided by pt's daughter to [employer].

## 2023-01-05 NOTE — TOC Transition Note (Signed)
Transition of Care [facility]) - CM/SW Discharge Note   Patient Details  Name: Andres White MRN: [id number] Date of Birth: August 12, 1945  Transition of Care [facility]) CM/SW Contact:  Ronny Bacon, RN Phone Number: 01/05/2023, 1:17 PM   Clinical Narrative:   Spoke with daugther, Andres White, by phone. Daughter notified of patient being discharge and will pick patient up around 2:30pm. Tresa Endo from [place] made aware that patient is being discharge home.     Final next level of care: Home w Home Health Services Barriers to Discharge: No Barriers Identified   Patient Goals and CMS Choice CMS Medicare.gov Compare Post Acute Care list provided to:: Other (Comment Required) Choice offered to / list presented to : Adult Children  Discharge Placement                         Discharge Plan and Services Additional resources added to the After Visit Summary for   In-house Referral: Hospice / Palliative Care Discharge Planning Services: CM Consult Post Acute Care Choice: Home Health          DME Arranged: N/A DME Agency: NA       HH Arranged: RN, PT, OT HH Agency: [facility] Date HH Agency Contacted: 01/05/23 Time HH Agency Contacted: 1254 Representative spoke with at [facility] Agency: Tresa Endo  Social Determinants of Health (SDOH) Interventions SDOH Screenings   Food Insecurity: No Food Insecurity (01/01/2023)  Housing: Low Risk  (01/01/2023)  Transportation Needs: No Transportation Needs (01/01/2023)  Utilities: Not At Risk (01/01/2023)  Alcohol Screen: Low Risk  (03/31/2022)  Depression (PHQ2-9): Low Risk  (04/15/2022)  Financial Resource Strain: Low Risk  (03/31/2022)  Physical Activity: Insufficiently Active (10/21/2022)  Social Connections: Socially Isolated (03/31/2022)  Stress: Stress Concern Present (10/21/2022)  Tobacco Use: High Risk (01/03/2023)     Readmission Risk Interventions     No data to display

## 2023-01-06 DIAGNOSIS — I6522 Occlusion and stenosis of left carotid artery: Secondary | ICD-10-CM | POA: Diagnosis not present

## 2023-01-06 DIAGNOSIS — D509 Iron deficiency anemia, unspecified: Secondary | ICD-10-CM | POA: Diagnosis not present

## 2023-01-06 DIAGNOSIS — I69318 Other symptoms and signs involving cognitive functions following cerebral infarction: Secondary | ICD-10-CM | POA: Diagnosis not present

## 2023-01-06 DIAGNOSIS — G629 Polyneuropathy, unspecified: Secondary | ICD-10-CM | POA: Diagnosis not present

## 2023-01-06 DIAGNOSIS — G9341 Metabolic encephalopathy: Secondary | ICD-10-CM | POA: Diagnosis not present

## 2023-01-06 DIAGNOSIS — Z86010 Personal history of colonic polyps: Secondary | ICD-10-CM | POA: Diagnosis not present

## 2023-01-06 DIAGNOSIS — I69351 Hemiplegia and hemiparesis following cerebral infarction affecting right dominant side: Secondary | ICD-10-CM | POA: Diagnosis not present

## 2023-01-06 DIAGNOSIS — I69391 Dysphagia following cerebral infarction: Secondary | ICD-10-CM | POA: Diagnosis not present

## 2023-01-06 DIAGNOSIS — K219 Gastro-esophageal reflux disease without esophagitis: Secondary | ICD-10-CM | POA: Diagnosis not present

## 2023-01-06 DIAGNOSIS — Z86018 Personal history of other benign neoplasm: Secondary | ICD-10-CM | POA: Diagnosis not present

## 2023-01-06 DIAGNOSIS — M1990 Unspecified osteoarthritis, unspecified site: Secondary | ICD-10-CM | POA: Diagnosis not present

## 2023-01-06 DIAGNOSIS — I130 Hypertensive heart and chronic kidney disease with heart failure and stage 1 through stage 4 chronic kidney disease, or unspecified chronic kidney disease: Secondary | ICD-10-CM | POA: Diagnosis not present

## 2023-01-06 DIAGNOSIS — Z87440 Personal history of urinary (tract) infections: Secondary | ICD-10-CM | POA: Diagnosis not present

## 2023-01-06 DIAGNOSIS — N1832 Chronic kidney disease, stage 3b: Secondary | ICD-10-CM | POA: Diagnosis not present

## 2023-01-06 DIAGNOSIS — I5032 Chronic diastolic (congestive) heart failure: Secondary | ICD-10-CM | POA: Diagnosis not present

## 2023-01-06 DIAGNOSIS — M47812 Spondylosis without myelopathy or radiculopathy, cervical region: Secondary | ICD-10-CM | POA: Diagnosis not present

## 2023-01-06 DIAGNOSIS — M109 Gout, unspecified: Secondary | ICD-10-CM | POA: Diagnosis not present

## 2023-01-06 DIAGNOSIS — I69398 Other sequelae of cerebral infarction: Secondary | ICD-10-CM | POA: Diagnosis not present

## 2023-01-06 DIAGNOSIS — G8929 Other chronic pain: Secondary | ICD-10-CM | POA: Diagnosis not present

## 2023-01-06 DIAGNOSIS — F17210 Nicotine dependence, cigarettes, uncomplicated: Secondary | ICD-10-CM | POA: Diagnosis not present

## 2023-01-06 DIAGNOSIS — Z7982 Long term (current) use of aspirin: Secondary | ICD-10-CM | POA: Diagnosis not present

## 2023-01-13 DIAGNOSIS — M1990 Unspecified osteoarthritis, unspecified site: Secondary | ICD-10-CM | POA: Diagnosis not present

## 2023-01-13 DIAGNOSIS — G629 Polyneuropathy, unspecified: Secondary | ICD-10-CM | POA: Diagnosis not present

## 2023-01-13 DIAGNOSIS — F17210 Nicotine dependence, cigarettes, uncomplicated: Secondary | ICD-10-CM | POA: Diagnosis not present

## 2023-01-13 DIAGNOSIS — I69398 Other sequelae of cerebral infarction: Secondary | ICD-10-CM | POA: Diagnosis not present

## 2023-01-13 DIAGNOSIS — I493 Ventricular premature depolarization: Secondary | ICD-10-CM | POA: Diagnosis not present

## 2023-01-13 DIAGNOSIS — I6522 Occlusion and stenosis of left carotid artery: Secondary | ICD-10-CM | POA: Diagnosis not present

## 2023-01-13 DIAGNOSIS — G8929 Other chronic pain: Secondary | ICD-10-CM | POA: Diagnosis not present

## 2023-01-13 DIAGNOSIS — M47812 Spondylosis without myelopathy or radiculopathy, cervical region: Secondary | ICD-10-CM | POA: Diagnosis not present

## 2023-01-13 DIAGNOSIS — I130 Hypertensive heart and chronic kidney disease with heart failure and stage 1 through stage 4 chronic kidney disease, or unspecified chronic kidney disease: Secondary | ICD-10-CM | POA: Diagnosis not present

## 2023-01-13 DIAGNOSIS — D500 Iron deficiency anemia secondary to blood loss (chronic): Secondary | ICD-10-CM | POA: Diagnosis not present

## 2023-01-13 DIAGNOSIS — R338 Other retention of urine: Secondary | ICD-10-CM | POA: Diagnosis not present

## 2023-01-13 DIAGNOSIS — N1832 Chronic kidney disease, stage 3b: Secondary | ICD-10-CM | POA: Diagnosis not present

## 2023-01-13 DIAGNOSIS — I69391 Dysphagia following cerebral infarction: Secondary | ICD-10-CM | POA: Diagnosis not present

## 2023-01-13 DIAGNOSIS — I69351 Hemiplegia and hemiparesis following cerebral infarction affecting right dominant side: Secondary | ICD-10-CM | POA: Diagnosis not present

## 2023-01-13 DIAGNOSIS — I69318 Other symptoms and signs involving cognitive functions following cerebral infarction: Secondary | ICD-10-CM | POA: Diagnosis not present

## 2023-01-13 DIAGNOSIS — G9341 Metabolic encephalopathy: Secondary | ICD-10-CM | POA: Diagnosis not present

## 2023-01-13 DIAGNOSIS — K2950 Unspecified chronic gastritis without bleeding: Secondary | ICD-10-CM | POA: Diagnosis not present

## 2023-01-13 DIAGNOSIS — M109 Gout, unspecified: Secondary | ICD-10-CM | POA: Diagnosis not present

## 2023-01-13 DIAGNOSIS — I739 Peripheral vascular disease, unspecified: Secondary | ICD-10-CM | POA: Diagnosis not present

## 2023-01-13 DIAGNOSIS — I5032 Chronic diastolic (congestive) heart failure: Secondary | ICD-10-CM | POA: Diagnosis not present

## 2023-01-13 DIAGNOSIS — N179 Acute kidney failure, unspecified: Secondary | ICD-10-CM | POA: Diagnosis not present

## 2023-01-22 DIAGNOSIS — I10 Essential (primary) hypertension: Secondary | ICD-10-CM | POA: Diagnosis not present

## 2023-01-29 DIAGNOSIS — R54 Age-related physical debility: Secondary | ICD-10-CM | POA: Diagnosis not present

## 2023-01-29 DIAGNOSIS — I10 Essential (primary) hypertension: Secondary | ICD-10-CM | POA: Diagnosis not present

## 2023-02-04 DIAGNOSIS — M6281 Muscle weakness (generalized): Secondary | ICD-10-CM | POA: Diagnosis not present

## 2023-02-17 ENCOUNTER — Other Ambulatory Visit: Payer: Self-pay | Admitting: Physical Medicine and Rehabilitation

## 2023-02-26 DIAGNOSIS — I10 Essential (primary) hypertension: Secondary | ICD-10-CM | POA: Diagnosis not present

## 2023-02-26 DIAGNOSIS — R54 Age-related physical debility: Secondary | ICD-10-CM | POA: Diagnosis not present

## 2023-02-27 DIAGNOSIS — D649 Anemia, unspecified: Secondary | ICD-10-CM | POA: Diagnosis not present

## 2023-02-27 DIAGNOSIS — K5900 Constipation, unspecified: Secondary | ICD-10-CM | POA: Diagnosis not present

## 2023-02-27 DIAGNOSIS — E785 Hyperlipidemia, unspecified: Secondary | ICD-10-CM | POA: Diagnosis not present

## 2023-02-27 DIAGNOSIS — N390 Urinary tract infection, site not specified: Secondary | ICD-10-CM | POA: Diagnosis not present

## 2023-02-27 DIAGNOSIS — R54 Age-related physical debility: Secondary | ICD-10-CM | POA: Diagnosis not present

## 2023-03-07 DIAGNOSIS — M6281 Muscle weakness (generalized): Secondary | ICD-10-CM | POA: Diagnosis not present

## 2023-03-10 NOTE — Progress Notes (Unsigned)
[facility] [address] [place]. [place] [address] [phone]       OFFICE FOLLOW UP VISIT NOTE  [name] Date of Birth:  04-09-1946 Medical Record Number:  [id number]   Referring MD: Delle Reining, PA-C Reason for Referral: Stroke, seizures and dementia    No chief complaint on file.    HPI:   Update 03/11/2023 JM: Patient returns for follow-up visit accompanied by his daughter.  Previously seen by Dr. Pearlean Brownie in January for significant decline in functioning following a cold.  Urinalysis suggestive of UTI and blood work suggestive of worsening kidney function, advised to follow-up with PCP for further treatment recommendations.  He was seen by his PCP on 1/26 who advised further ER evaluation.  He was admitted for 6 days with evidence of sepsis likely from UTI, AKI (resolved at time of discharge), and hypokalemia (resolved at discharge).  Gabapentin and citalopram discontinued.  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  CTA showed progression of left ICA at 80% as well as 5 mm anterior communicating artery aneurysm.  EEG no seizures or epileptiform activity, continued on Keppra 500 mg twice daily.  Also noted GI bleed with severe anemia, held aspirin and recommended consideration of aspirin or Plavix monotherapy once anemia improves and cleared from GI.  Patient also treated for UTI and COVID infection by primary team.  Goals of care discussion in setting of multiple comorbidities, advanced age and overall poor prognosis - family opted to keep patient full code and continue full scope of treatment.  Therapies recommended SNF although daughter opted to bring patient home as he was at baseline functioning.            History provided for reference purposes only Update 08/26/2022 Dr. Pearlean Brownie: Patient is seen today urgently upon request from his daughter because significant general decline in his condition following a code 3 weeks ago.  Patient's current symptoms seem to have subsided but he is become weak all over with inability to get up and stand or walk.  He is also been mentally quite slow and increased.  He can be aroused but no energy to do anything.  His baseline right-sided weakness also appears more pronounced.  He can hardly stay awake.[name]  He can barely open his eyes and then falls asleep.  He remains on gabapentin 300 mg 3 times daily as well as Eliquis 50 mg at night.  Patient has also not been eating well and may have lost some weight.  Has an upcoming appointment to see his primary care physician coming Friday.  He has not had any recent lab work or brain imaging study done.  Noticed hemiplegia, slurred speech diplopia or other strokelike symptoms.   Update 08/05/2022 JM: Patient returns for 79-month follow-up.  Daughter reports episode of slurred speech and left hand numbness about 1 month upon awakening that lasted about 30 seconds then completely resolved. Daughter unsure if symptoms from just waking up or sleeping on hand wrong. Patient declined going to ED at that time. Per daughter, he was able to answer questions and follow commands. Did not lose consciousness.  Denies any weakness, visual changes or cognitive changes. No reoccuring or new stroke/TIA symptoms since that time.    Residual right-sided weakness and cognitive impairment.  Use of RW at all times, no falls. Daughter reports some mild gradual decline in memory since last visit. MMSE today 13/30 (prior 11/30). Daughter continues to provide 24/7 supervision and assistance for ADLs and IADLs.  Remains on aspirin and atorvastatin.  Blood pressure today 158/93.  Routinely follows with PCP. Has f/u next week, plans on repeat lab work.    Denies any seizure activity, remains on Keppra 500 mg BID. Has since stopped Dilantin.  No further concerns at this time  Update 01/20/2022 JM: Patient returns for stroke, seizure and dementia follow-up after prior visit almost 1 year ago (requested 79-month follow-up).  He is accompanied by his daughter who provides majority of history.  Stable from stroke standpoint, denies new stroke/TIA symptoms.  Residual right-sided weakness and cognitive impairment stable. Will use RW at all times, no recent falls.  Compliant on aspirin and atorvastatin, denies side effects.  Blood pressure today 150/82.   Cognition has been stable since prior visit.  MMSE today 11/30 (prior 11/30).  Continues to live with his daughter who provides 24/7 supervision and assistance for majority of ADLs.  Recent lab work with PCP showed low B12 at 176 and was started on oral B12 supplement.  Denies any seizure activity.  Discussed weaning off of Dilantin at prior visit but he has since remained on both Keppra and Dilantin due to miscommunication.  Denies side effects on medications.  He has since establish care with new PCP, he was referred to urologist due to elevated PSA.  Daughter also concerned regarding frequent urination, questions if this is due to furosemide.  This is currently being managed by cardiology which was started back in 10/2020 for edema.  Daughter was encouraged to ensure evaluation with urology scheduled for further discussion as well as with cardiology for furosemide management  No further concerns at this time  Update 02/25/2021 JM: [name] returns for 79-month stroke and seizure follow-up accompanied by his daughter who provides history.  He has been stable since prior visit without new stroke/TIA symptoms or seizure activity.  Reports residual right sided weakness and cognitive impairment- was working with [facility] therapies but stopped mid Spring due to short staffing.  He does try to stay active at home  but not necessarily any type of routine physical or mental exercises.  He ambulates with rolling walker - did have 1 fall when he was not using walker thankfully without injury.  He continues to live with his daughter who provides 24/7 supervision as well as assistance for ADLs.  Cognition has been generally stable but fluctuates per daughter.  On gabapentin 300 mg TID per PMR Dr. Carlis Abbott for neuropathy pain.  Compliant on aspirin and atorvastatin without associated side effects.  Blood pressure today 151/87.  Compliant on Keppra and Dilantin tolerating without side effects.  No further concerns at this time.  Update 11/08/2020 Dr. Pearlean Brownie: He returns for follow-up after last visit 3 months ago.  Patient is referred back to see me because of new problems of seizures which required admission to [facility] on 09/16/2020.  Patient will at home when family witnessed that he was not acting his usual self and was gazing upwards.  When EMS arrived they noticed a left gaze deviation and unresponsiveness to voice and following commands and possibly some left-sided weakness.  Patient was initially brought in as a code stroke but while being evaluated was noticed is likely having a seizure because he had tonic gaze deviation with a cry and  stiffening of the whole body followed by generalized shaking which lasted about a minute.  He was given IV Ativan which stopped the episode.  He was also loaded with IV Keppra.  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  EEG done on 09/17/2020 and 09/18/2020 showed moderate diffuse encephalopathy there were several episodes of even better being pushed by the staff when they noticed tremulous movements on the right but these did not have any electrographic correlates for seizures.  Patient was intubated and IV phenytoin was added to the Keppra  with the got better and self extubated himself.  He finished long-term EEG monitoring which showed no definite seizure activity.  MRI scan of the brain on 09/20/2020 was motion degraded but showed no acute abnormality.  Showed evidence of old infarcts bilaterally.  Patient did have some cognitive impairment following his previous stroke when the daughter feels that this has gotten worse now.  He requires constant supervision.  He cannot be left alone.  He is able to walk with a walker but he is disoriented at baseline and requires help with most activities of daily living.  He has had no recurrent seizures of focal jerking since his being home.  He is still on the current dosages of Keppra finder milligram twice daily and phenytoin 100 mg   3 times a day.  Is had no recurrent stroke or TIA symptoms and remains on aspirin she is tolerating well.  Blood pressure is well controlled today it is slightly elevated 160/82.  Is tolerating Lipitor well without muscle aches and pains.  Initial visit 08/30/2020 Dr. Pearlean Brownie: Mr. Sallyanne Kuster is a 77 year old African-American male seen today for initial office consultation visit for stroke.  Is accompanied by his daughter who provides history.  I also reviewed electronic medical records and imaging films in PACS.  He has a past medical history of hypertension, tobacco abuse, arthritis who presented to [facility] on 06/25/2020 with right leg weakness for 4 days prior to admission.  He was found to have mild right hand and more leg weakness.  MRI scan of the brain showed a left midbrain lacunar infarct.  Carotid ultrasound suggested moderate 70 to 90% left ICA stenosis which is asymptomatic.  Echocardiogram showed normal ejection fraction without cardiac source of embolism.  LDL cholesterol was 32 mg percent.  Hemoglobin A1c was 4.8.  Lower extremity venous Dopplers were negative.  Patient subsequently had 2 weeks external cardiac monitor done which was negative for  paroxysmal A. fib.  Patient was started on aspirin 81 mg daily which he is tolerating well without bruising or bleeding.  He went to inpatient rehab for 4 weeks and subsequently is at home living with her daughter.  Is able to ambulate independently now though he still has some diminished fine motor skills and mild right leg dragging.  His speech is improved.  He however has significant memory loss and cognitive impairment following his stroke as per the daughter.  He is quite forgetful and cannot remember recent conversations and needs constant reminders.  He was previously living independently and managing his own affairs but daughter feels he is no longer able to do so.  Patient states he is cut back smoking significantly but still smokes a few cigarettes a day.  His blood pressure is well controlled and today it is 142/86.  There is no family history of dementia.  Patient has no prior history of seizures or significant neurological problems.   ROS:   14 system review of systems is positive for those listed in HPI and all other systems negative.     PMH:  Past Medical History:  Diagnosis Date   Arthritis    Chronic back pain    Duodenal adenoma    Gout    Hypertension    Stroke (HCC) 06/2020   Syncope and collapse     Social History:  Social History   Socioeconomic History   Marital status: Single    Spouse name: Not on file   Number of children: 1   Years of education: 12   Highest education level: 12th grade  Occupational History   Not on file  Tobacco Use   Smoking status: Every Day    Current packs/day: 0.50    Average packs/day: 0.5 packs/day for 20.0 years (10.0 ttl pk-yrs)    Types: Cigarettes    Passive exposure: Current   Smokeless tobacco: Never   Tobacco comments:    Verified by Daughter - Blima Ledger, 2 cigarettes per day 10-23-22  Vaping Use   Vaping status: Never Used  Substance and Sexual Activity   Alcohol use: Not Currently    Comment: occasional    Drug use: No   Sexual activity: Not Currently  Other Topics Concern   Not on file  Social History Narrative   Lives with daughter, son in law and 2 grandchildren   Right Handed   Drinks 1 cup caffeine 4 times a week-ish   Social Determinants of Health   Financial Resource Strain: Low Risk  (03/31/2022)   Overall Financial Resource Strain (CARDIA)    Difficulty of Paying Living Expenses: Not hard at all  Food Insecurity: No Food Insecurity (01/01/2023)   Hunger Vital Sign    Worried About Running Out of Food in the Last Year: Never true    Ran Out of Food in the Last Year: Never true  Transportation Needs: No Transportation Needs (01/01/2023)   PRAPARE - Administrator, Civil Service (Medical): No    Lack of Transportation (Non-Medical): No  Physical Activity: Insufficiently Active (10/21/2022)   Exercise Vital Sign    Days of Exercise per Week: 1 day    Minutes of Exercise per Session: 10 min  Stress: Stress Concern Present (10/21/2022)   [employer] of Occupational Health - Occupational Stress Questionnaire    Feeling of Stress : To some extent  Social Connections: Socially Isolated (03/31/2022)   Social Connection and Isolation Panel [NHANES]    Frequency of Communication with Friends and Family: More than three times a week    Frequency of Social Gatherings with Friends and Family: More than three times a week    Attends Religious Services: Never    Database administrator or Organizations: No    Attends Banker Meetings: Never    Marital Status: Divorced  Catering manager Violence: Patient Unable To Answer (01/01/2023)   Humiliation, Afraid, Rape, and Kick questionnaire    Fear of Current or Ex-Partner: Patient unable to answer    Emotionally Abused: Patient unable to answer    Physically Abused: Patient unable to answer    Sexually Abused: Patient unable to answer    Medications:   Current Outpatient Medications on File Prior to Visit   Medication Sig Dispense Refill   acetaminophen (TYLENOL) 325 MG tablet Take 1-2  tablets (325-650 mg total) by mouth every 4 (four) hours as needed for mild pain.     allopurinol (ZYLOPRIM) 100 MG tablet Take 1 tablet (100 mg total) by mouth daily. TAKE 1 TABLET(100 MG) BY MOUTH DAILY 90 tablet 3   aspirin EC 81 MG tablet Take 1 tablet (81 mg total) by mouth daily. Resume on 6/4 90 tablet 3   atorvastatin (LIPITOR) 40 MG tablet TAKE 1 TABLET BY MOUTH ONCE DAILY 30 tablet 5   citalopram (CELEXA) 10 MG tablet Take 10 mg by mouth daily.     colchicine 0.6 MG tablet Take 0.6 mg by mouth daily.     feeding supplement (ENSURE ENLIVE / ENSURE PLUS) LIQD Take 237 mLs by mouth 3 (three) times daily between meals. 237 mL 30   ferrous sulfate 300 (60 Fe) MG/5ML syrup Take 5 mLs (300 mg total) by mouth daily with breakfast. 150 mL 3   furosemide (LASIX) 20 MG tablet Take 1 tablet (20 mg total) by mouth every morning. (Patient taking differently: Take 20 mg by mouth every other day.) 60 tablet 0   hydrocerin (EUCERIN) CREA Apply 1 application. topically 2 (two) times daily. (Patient taking differently: Apply 1 application  topically 2 (two) times daily. Apply to both legs) 454 g 1   levETIRAcetam (KEPPRA) 500 MG tablet Take 1 tablet (500 mg total) by mouth 2 (two) times daily. 180 tablet 3   Multiple Vitamin (MULTIVITAMIN WITH MINERALS) TABS tablet Take 1 tablet by mouth daily. (Patient taking differently: Take 1 tablet by mouth daily at 12 noon.)     pantoprazole (PROTONIX) 40 MG tablet TAKE 1 TABLET BY MOUTH TWICE DAILY 60 tablet 8   polyethylene glycol (MIRALAX / GLYCOLAX) 17 g packet Take 17 g by mouth daily. 14 each 0   potassium chloride (KLOR-CON) 20 MEQ packet Take 40 mEq by mouth daily for 14 days. 28 each 0   QUEtiapine (SEROQUEL) 50 MG tablet Take 50 mg by mouth at bedtime.     sucralfate (CARAFATE) 1 g tablet Take 1 tablet (1 g total) by mouth 2 (two) times daily. 60 tablet 0   tamsulosin (FLOMAX)  0.4 MG CAPS capsule Take 1 capsule (0.4 mg total) by mouth daily after supper. 60 capsule 8   traMADol (ULTRAM) 50 MG tablet Take 1 tablet (50 mg total) by mouth 2 (two) times daily as needed. 60 tablet 5   vitamin B-12 (CYANOCOBALAMIN) 1000 MCG tablet Take 1,000 mcg by mouth daily.     No current facility-administered medications on file prior to visit.    Allergies:  No Known Allergies  Physical Exam There were no vitals filed for this visit.    There is no height or weight on file to calculate BMI.   General: Frail very pleasant elderly African-American male, seated, in no evident distress Head: head normocephalic and atraumatic.   Neck: supple with no carotid or supraclavicular bruits Cardiovascular: regular rate and rhythm, no murmurs Musculoskeletal: no deformity Skin:  no rash/petichiae Vascular:  Normal pulses all extremities  Neurologic Exam Mental Status: Awake and fully alert.  Unable to appreciate dysarthria or aphasia.  Able to follow commands without difficulty.    08/05/2022    1:25 PM 01/20/2022   11:04 AM 02/25/2021    8:43 AM  MMSE - Mini Mental State Exam  Orientation to time 0 0 1  Orientation to Place 3 3 3   Registration 3 3 3   Attention/ Calculation 0 0 0  Recall 1  1 3  Language- name 2 objects 2 1 1   Language- repeat 1 0 0  Language- follow 3 step command 3 3 0  Language- read & follow direction 0 0 0  Write a sentence 0 0 0  Copy design 0 0 0  Total score 13 11 11    Cranial Nerves: Pupils equal, briskly reactive to light. Extraocular movements full without nystagmus. Visual fields full to confrontation. Hearing intact. Facial sensation intact. Face, tongue, palate moves normally and symmetrically.  Motor: Normal bulk and tone. Normal strength in all tested extremity muscles except mild right grip weakness and diminished fine finger movements on the right and orbits left over right upper extremity and right ankle dorsiflexion and plantarflexion  weakness (chronic) Sensory.: intact to touch , pinprick , position and vibratory sensation.  Coordination: Rapid alternating movements normal on left side. Finger-to-nose and heel-to-shin performed accurately on left side with incoordination right upper and lower extremity Gait and Station: Arises from chair with difficulty. Stance is stooped.  Using a walker.  Gait demonstrates slight dragging of the right leg and unsteadiness.  Tandem walk and heel toe not attempted Reflexes: 1+ and symmetric. Toes downgoing.        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  He has post stroke residual mild right hemiparesis as well as dementia and memory loss following his stroke which appears to have worsened after recent admission for seizures in February 2022.      Transient neurological symptoms, Episode of slurred speech and left hand numbness 1 month ago lasting for about 30 seconds then completely resolved. No other associated symptoms. Neuro exam stable today compared to baseline, no new or worsening chronic deficits noted.  Possible right brain TIA, per request can hold off on MRI brain as symptoms resolved after short duration without any reoccurrence and current treatment plan would not change as he is on max medical therapy. Did discuss importance of calling 911 immediately with any new onset or worsening stroke/TIA symptoms for emergent evaluation Does have hx of carotid stenosis, recommend repeat carotid ultrasound for surveillance monitoring Has follow-up next week with PCP with plans on repeat lab work  Left lacunar infarct:  Residual deficit: Right hemiparesis and cognitive impairment. Stable. Discussed use of RW at all times for fall prevention. Continue aspirin 81 mg daily, Pletal  and atorvastatin for secondary stroke prevention and per  cardiology recommendations  Close PCP f/u for aggressive stroke risk factor management including BP goal<130/90, and HLD with LDL goal<70  Vascular dementia: MMSE 13/30 (prior 11/30). Discussed importance of routine memory exercises at home as well as routine physical exercise (as tolerated), healthy diet, adequate sleep and management of risk factors.  Daughter continues to provide 24/7 supervision and care  Seizures:  Stable without new seizure activity.   Continue Keppra 500 mg twice daily - refill provided EEG 04/2022 no evidence of seizure activity (completed after discontinuing Dilantin)    Follow-up in 7 months or call earlier if needed   CC:  Scism, Courtney Paris, FNP   I spent 34 minutes of face-to-face and non-face-to-face time with patient and daughter.  This included previsit chart review, lab review, study review, order entry, electronic health record documentation, patient and daughter education and discussion regarding above diagnoses and treatment plan and answered all the questions to patient and daughter satisfaction  Ihor Austin, AGNP-BC  [facility] [address] [place], [place] [address]  Phone [phone] Fax [phone] Note: This document was prepared with digital dictation and possible smart phrase technology. Any transcriptional errors that result from this process are unintentional.

## 2023-03-11 ENCOUNTER — Telehealth: Payer: Self-pay | Admitting: Adult Health

## 2023-03-11 ENCOUNTER — Encounter: Payer: Self-pay | Admitting: Adult Health

## 2023-03-11 ENCOUNTER — Ambulatory Visit: Payer: Medicare Other | Admitting: Adult Health

## 2023-03-11 VITALS — BP 182/84 | HR 64 | Ht 67.0 in | Wt 165.4 lb

## 2023-03-11 DIAGNOSIS — F0150 Vascular dementia without behavioral disturbance: Secondary | ICD-10-CM

## 2023-03-11 DIAGNOSIS — I69398 Other sequelae of cerebral infarction: Secondary | ICD-10-CM

## 2023-03-11 DIAGNOSIS — I6522 Occlusion and stenosis of left carotid artery: Secondary | ICD-10-CM | POA: Diagnosis not present

## 2023-03-11 DIAGNOSIS — I639 Cerebral infarction, unspecified: Secondary | ICD-10-CM | POA: Diagnosis not present

## 2023-03-11 DIAGNOSIS — R569 Unspecified convulsions: Secondary | ICD-10-CM | POA: Diagnosis not present

## 2023-03-11 DIAGNOSIS — I671 Cerebral aneurysm, nonruptured: Secondary | ICD-10-CM

## 2023-03-11 MED ORDER — LEVETIRACETAM 500 MG PO TABS: 500.0000 mg | ORAL_TABLET | Freq: Two times a day (BID) | ORAL | 3 refills | Status: DC

## 2023-03-11 NOTE — Patient Instructions (Addendum)
Plan to repeat imaging around November to re evaluate cerebral aneurysm and carotid stenosis - you will be called to schedule  You will be called to set up home health therapies   Continue keppra 500mg  twice daily for seizure prevention  Continue aspirin 81 mg daily  and Lipitor  for secondary stroke prevention  Continue to follow up with PCP regarding cholesterol and blood pressure management  Maintain strict control of hypertension with blood pressure goal below 130/90 and cholesterol with LDL cholesterol (bad cholesterol) goal below 70 mg/dL.   Signs of a Stroke? Follow the BEFAST method:  Balance Watch for a sudden loss of balance, trouble with coordination or vertigo Eyes Is there a sudden loss of vision in one or both eyes? Or double vision?  Face: Ask the person to smile. Does one side of the face droop or is it numb?  Arms: Ask the person to raise both arms. Does one arm drift downward? Is there weakness or numbness of a leg? Speech: Ask the person to repeat a simple phrase. Does the speech sound slurred/strange? Is the person confused ? Time: If you observe any of these signs, call 911.      Followup in the future with me in 6 months or call earlier if needed       Thank you for coming to see [place] at [facility]. I hope we have been able to provide you high quality care today.  You may receive a patient satisfaction survey over the next few weeks. We would appreciate your feedback and comments so that we may continue to improve ourselves and the health of our patients.

## 2023-03-11 NOTE — Telephone Encounter (Signed)
[facility] is taking this patient.

## 2023-03-15 NOTE — Progress Notes (Signed)
I agree with the above plan 

## 2023-03-26 NOTE — Progress Notes (Deleted)
Cardiology Office Note:   Date:  03/26/2023  NAME:  Andres White    MRN: [id number] DOB:  Feb 19, 1946   PCP:  Hildred Priest, FNP  Cardiologist:  Reatha Harps, MD  Electrophysiologist:  None   Referring MD: Hildred Priest, FNP   No chief complaint on file.   History of Present Illness:   Andres White is a 77 y.o. male with below history who presents for follow-up. Admitted 12/2022 for another stroke.   Problem List 1. HTN 2. 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 Tobacco abuse 4. HLD -T chol 79, HDL 44, LDL 19, triglycerides 81 -Y8M 4.5 5. Carotid Artery Disease -80% L cervical ICA 6. Ventricular tachycardia vs SVT with aberrancy  -captured on monitor; no symptoms; monomorphic, 39 second duration  -EF 55-60% -normal MPI 08/08/2020 7. 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  Dementia 9. PAD -R ABI 0.74 -L TBI 0.86  Past Medical History: Past Medical History:  Diagnosis Date   Arthritis    Chronic back pain    Duodenal adenoma    Gout    Hypertension    Stroke (HCC) 06/2020   Syncope and collapse     Past Surgical History: Past Surgical History:  Procedure Laterality Date   BIOPSY  07/13/2020   Procedure: BIOPSY;  Surgeon: Meryl Dare, MD;  Location: [facility] ENDOSCOPY;  Service: Endoscopy;;   BIOPSY  11/28/2020   Procedure: BIOPSY;  Surgeon: Lemar Lofty., MD;  Location: [facility] ENDOSCOPY;  Service: Gastroenterology;;   CARDIAC CATHETERIZATION  2015   [facility]    COLONOSCOPY WITH PROPOFOL N/A 07/13/2020   Procedure: COLONOSCOPY WITH PROPOFOL;  Surgeon: Meryl Dare, MD;  Location: [facility] ENDOSCOPY;  Service: Endoscopy;  Laterality: N/A;   COLONOSCOPY WITH PROPOFOL N/A 11/28/2020   Procedure: COLONOSCOPY WITH PROPOFOL;  Surgeon: Meridee Score Netty Starring., MD;  Location: [facility] ENDOSCOPY;  Service: Gastroenterology;  Laterality: N/A;   ENDOSCOPIC MUCOSAL RESECTION N/A 11/28/2020   Procedure: ENDOSCOPIC MUCOSAL  RESECTION;  Surgeon: Meridee Score Netty Starring., MD;  Location: [facility] ENDOSCOPY;  Service: Gastroenterology;  Laterality: N/A;   ENTEROSCOPY N/A 01/03/2023   Procedure: ENTEROSCOPY;  Surgeon: Meridee Score Netty Starring., MD;  Location: [facility] ENDOSCOPY;  Service: Gastroenterology;  Laterality: N/A;   ESOPHAGOGASTRODUODENOSCOPY N/A 07/13/2020   Procedure: ESOPHAGOGASTRODUODENOSCOPY (EGD);  Surgeon: Meryl Dare, MD;  Location: [facility] ENDOSCOPY;  Service: Endoscopy;  Laterality: N/A;   ESOPHAGOGASTRODUODENOSCOPY (EGD) WITH PROPOFOL N/A 11/28/2020   Procedure: ESOPHAGOGASTRODUODENOSCOPY (EGD) WITH PROPOFOL;  Surgeon: Meridee Score Netty Starring., MD;  Location: [facility] ENDOSCOPY;  Service: Gastroenterology;  Laterality: N/A;   HEMOSTASIS CLIP PLACEMENT  11/28/2020   Procedure: HEMOSTASIS CLIP PLACEMENT;  Surgeon: Lemar Lofty., MD;  Location: [facility] ENDOSCOPY;  Service: Gastroenterology;;   HOT HEMOSTASIS N/A 01/03/2023   Procedure: HOT HEMOSTASIS (ARGON PLASMA COAGULATION/BICAP);  Surgeon: Lemar Lofty., MD;  Location: [facility] ENDOSCOPY;  Service: Gastroenterology;  Laterality: N/A;   KNEE SURGERY Right    POLYPECTOMY  07/13/2020   Procedure: POLYPECTOMY;  Surgeon: Meryl Dare, MD;  Location: [facility] ENDOSCOPY;  Service: Endoscopy;;   POLYPECTOMY  11/28/2020   Procedure: POLYPECTOMY;  Surgeon: Lemar Lofty., MD;  Location: [facility] ENDOSCOPY;  Service: Gastroenterology;;   Brooke Dare INJECTION  11/28/2020   Procedure: SUBMUCOSAL LIFTING INJECTION;  Surgeon: Lemar Lofty., MD;  Location: [facility] ENDOSCOPY;  Service: Gastroenterology;;   SUBMUCOSAL TATTOO INJECTION  11/28/2020   Procedure: SUBMUCOSAL TATTOO INJECTION;  Surgeon: Lemar Lofty., MD;  Location:  [facility] ENDOSCOPY;  Service: Gastroenterology;;   SUBMUCOSAL TATTOO INJECTION  01/03/2023   Procedure: SUBMUCOSAL TATTOO INJECTION;  Surgeon: Lemar Lofty., MD;  Location: [facility] ENDOSCOPY;  Service: Gastroenterology;;    Current  Medications: No outpatient medications have been marked as taking for the 03/27/23 encounter (Appointment) with O'Neal, Ronnald Ramp, MD.     Allergies:    Patient has no known allergies.   Social History: Social History   Socioeconomic History   Marital status: Single    Spouse name: Not on file   Number of children: 1   Years of education: 12   Highest education level: 12th grade  Occupational History   Not on file  Tobacco Use   Smoking status: Every Day    Current packs/day: 0.50    Average packs/day: 0.5 packs/day for 20.0 years (10.0 ttl pk-yrs)    Types: Cigarettes    Passive exposure: Current   Smokeless tobacco: Never   Tobacco comments:    Verified by Daughter - Blima Ledger, 2 cigarettes per day 10-23-22  Vaping Use   Vaping status: Never Used  Substance and Sexual Activity   Alcohol use: Not Currently    Comment: occasional   Drug use: No   Sexual activity: Not Currently  Other Topics Concern   Not on file  Social History Narrative   Lives with daughter, son in law and 2 grandchildren   Right Handed   Drinks 1 cup caffeine 4 times a week-ish   Social Determinants of Health   Financial Resource Strain: Low Risk  (03/31/2022)   Overall Financial Resource Strain (CARDIA)    Difficulty of Paying Living Expenses: Not hard at all  Food Insecurity: No Food Insecurity (01/01/2023)   Hunger Vital Sign    Worried About Running Out of Food in the Last Year: Never true    Ran Out of Food in the Last Year: Never true  Transportation Needs: No Transportation Needs (01/01/2023)   PRAPARE - Administrator, Civil Service (Medical): No    Lack of Transportation (Non-Medical): No  Physical Activity: Insufficiently Active (10/21/2022)   Exercise Vital Sign    Days of Exercise per Week: 1 day    Minutes of Exercise per Session: 10 min  Stress: Stress Concern Present (10/21/2022)   [employer] of Occupational Health - Occupational Stress Questionnaire     Feeling of Stress : To some extent  Social Connections: Socially Isolated (03/31/2022)   Social Connection and Isolation Panel [NHANES]    Frequency of Communication with Friends and Family: More than three times a week    Frequency of Social Gatherings with Friends and Family: More than three times a week    Attends Religious Services: Never    Database administrator or Organizations: No    Attends Banker Meetings: Never    Marital Status: Divorced     Family History: The patient's family history includes Hypertension in his mother. There is no history of Colon cancer, Pancreatic cancer, Stomach cancer, Esophageal cancer, Inflammatory bowel disease, Liver disease, or Rectal cancer.  ROS:   All other ROS reviewed and negative. Pertinent positives noted in the HPI.     EKGs/Labs/Other Studies Reviewed:   The following studies were personally reviewed by me today:  EKG:  EKG is *** ordered today.        TTE 12/31/2022  1. Left ventricular ejection fraction, by estimation, is 60 to 65%. The  left ventricle has normal function.  The left ventricle has no regional  wall motion abnormalities. There is moderate asymmetric left ventricular  hypertrophy of the inferior segment.  Left ventricular diastolic parameters are indeterminate.   2. Right ventricular systolic function is normal. The right ventricular  size is normal.   3. The mitral valve is grossly normal. No evidence of mitral valve  regurgitation. No evidence of mitral stenosis.   4. The aortic valve is tricuspid. There is mild calcification of the  aortic valve. Aortic valve regurgitation is not visualized. Aortic valve  sclerosis is present, with no evidence of aortic valve stenosis.   Recent Labs: 12/30/2022: TSH 2.741 12/31/2022: ALT 14 01/04/2023: Hemoglobin 7.8; Magnesium 1.7; Platelets 238 01/05/2023: BUN 7; Creatinine, Ser 1.11; Potassium 3.6; Sodium 138   Recent Lipid Panel    Component Value Date/Time    CHOL 75 01/01/2023 0832   TRIG 49 01/01/2023 0832   HDL 27 (L) 01/01/2023 0832   CHOLHDL 2.8 01/01/2023 0832   VLDL 10 01/01/2023 0832   LDLCALC 38 01/01/2023 0832    Physical Exam:   VS:  There were no vitals taken for this visit.   Wt Readings from Last 3 Encounters:  03/11/23 165 lb 6.4 oz (75 kg)  01/03/23 167 lb 8.8 oz (76 kg)  10/23/22 161 lb 12.8 oz (73.4 kg)    General: Well nourished, well developed, in no acute distress Head: Atraumatic, normal size  Eyes: PEERLA, EOMI  Neck: Supple, no JVD Endocrine: No thryomegaly Cardiac: Normal S1, S2; RRR; no murmurs, rubs, or gallops Lungs: Clear to auscultation bilaterally, no wheezing, rhonchi or rales  Abd: Soft, nontender, no hepatomegaly  Ext: No edema, pulses 2+ Musculoskeletal: No deformities, BUE and BLE strength normal and equal Skin: Warm and dry, no rashes   Neuro: Alert and oriented to person, place, time, and situation, CNII-XII grossly intact, no focal deficits  Psych: Normal mood and affect   ASSESSMENT:   Andres White is a 76 y.o. male who presents for the following: No diagnosis found.  PLAN:   There are no diagnoses linked to this encounter.  {Are you ordering a CV Procedure (e.g. stress test, cath, DCCV, TEE, etc)?   Press F2        :[id number]}  Disposition: No follow-ups on file.  Medication Adjustments/Labs and Tests Ordered: Current medicines are reviewed at length with the patient today.  Concerns regarding medicines are outlined above.  No orders of the defined types were placed in this encounter.  No orders of the defined types were placed in this encounter.  There are no Patient Instructions on file for this visit.   Time Spent with Patient: I have spent a total of *** minutes with patient reviewing hospital notes, telemetry, EKGs, labs and examining the patient as well as establishing an assessment and plan that was discussed with the patient.  > 50% of time was spent in direct patient  care.  Signed, Lenna Gilford. Flora Lipps, MD, [facility]  [facility]  [address], [address] [place], [place] [address] [phone]  03/26/2023 12:47 PM

## 2023-03-27 ENCOUNTER — Ambulatory Visit: Payer: Medicare Other | Admitting: Cardiovascular Disease

## 2023-04-07 DIAGNOSIS — M6281 Muscle weakness (generalized): Secondary | ICD-10-CM | POA: Diagnosis not present

## 2023-04-08 DIAGNOSIS — Z87898 Personal history of other specified conditions: Secondary | ICD-10-CM | POA: Diagnosis not present

## 2023-04-08 DIAGNOSIS — K2950 Unspecified chronic gastritis without bleeding: Secondary | ICD-10-CM | POA: Diagnosis not present

## 2023-04-08 DIAGNOSIS — Z79899 Other long term (current) drug therapy: Secondary | ICD-10-CM | POA: Diagnosis not present

## 2023-04-08 DIAGNOSIS — I110 Hypertensive heart disease with heart failure: Secondary | ICD-10-CM | POA: Diagnosis not present

## 2023-04-08 DIAGNOSIS — K5909 Other constipation: Secondary | ICD-10-CM | POA: Diagnosis not present

## 2023-04-08 DIAGNOSIS — R001 Bradycardia, unspecified: Secondary | ICD-10-CM | POA: Diagnosis not present

## 2023-04-08 DIAGNOSIS — E785 Hyperlipidemia, unspecified: Secondary | ICD-10-CM | POA: Diagnosis not present

## 2023-04-13 ENCOUNTER — Ambulatory Visit: Payer: Medicare Other | Admitting: Physician Assistant

## 2023-04-15 DIAGNOSIS — I10 Essential (primary) hypertension: Secondary | ICD-10-CM | POA: Diagnosis not present

## 2023-05-05 NOTE — Progress Notes (Deleted)
Cardiology Office Note:  .   Date:  05/05/2023  ID:  Andres White, DOB Aug 22, 1945, MRN [id number] PCP: Hildred Priest, FNP  [facility] Providers Cardiologist:  Reatha Harps, MD    History of Present Illness: .   Andres White is a 77 y.o. male with a past medical history of PVCs, VT, chronic diastolic heart failure, history of CVA with residual right sided weakness, carotid artery stenosis, PAD, HTN, HLD, CKD, BPH, gout, dementia. Patient is followed by Dr. Flora Lipps and presents today for a 6 month follow up appointment for VT.   Per chart review, patient previously wore a cardiac monitor in 08/2020 that showed 158 episodes of wide-complex tachycardia occurred (favor VT over SVT). Episodes were brief. Patient was not started on BB due to history of bradycardia. Underwent nuclear stress test 08/08/20 that was a normal, low risk study without ischemia or infarction. Patient has a history of PAD and most recent ABIs from 12/2020 indicated moderate right lower extremity arterial disease, noncompressible left lower extremity arteries. Follows with vascular surgery.  Patient was admitted in 09/2022 with sepsis secondary to UTI. Patient had worsening right-sided weakness, so there was concern for CVA. Patient wore a cardiac event monitor that showed NSVT, occasional PVCS (6% burden).   Patient was again admitted in 12/2022 with fatigue, lethargy.  In the ED, labs showed K2.2, mag 1.4.  He was noted to have increased episodes of PVCs, NSVT on telemetry.  Cardiology was consulted for increased ectopy.  After K and mag were repleted, arrhythmia had subsided and patient only had rare PVCs on telemetry.  He underwent echocardiogram on 12/31/2022 that showed EF 60-65%, no regional wall motion abnormalities, moderate LVH, normal RV function  During that admission, patient was found to have a hemoglobin of 5. 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  Patient also had AKI  with creatinine of 1.6, thought to be secondary to urinary retention.  Patient had a Foley catheter placed with improvement in creatinine.  Of note, PT OT was consulted and recommended SNF but patient's daughter chose to take him home.  Palliative care was consulted, but family opted to keep patient full code and continue full scope of treatment.  PVCs  NSVT  -Patient's most recent cardiac monitor in 09/2022 showed some NSVT, occasional PVCs (6% burden), PACs (4% burden) -Patient was admitted in 12/2022 with anemia, AKI.  Noted to have PVCs, NSVT in the ED.  At that time, K was 2.2, mag was 1.4.  After supplementation, PVCs and NSVT resolved -Patient is not on beta-blocker therapy due to baseline bradycardia  - Check BMP, mag today   Chronic diastolic heart failure -Most recent echocardiogram 12/2022 showed EF 60 to 65%, no regional wall motion abnormalities, moderate LVH, normal RV function -Currently on Lasix 20 mg daily - Checking BMP as above   History of CVA Dementia -Patient is followed by neurology -Continue ASA 81 mg daily, Lipitor 40 mg daily   History of GI bleed - Patient admitted in 12/2022 with anemia (hemoglobin 5)  - Underwent small bowel enteroscopy that admission with findings of AVMs.  AVMs were ablated -Now back on aspirin 81 mg daily - *** CBC?   Carotid artery stenosis -CTA head neck in 12/2022 showed 80% stenosis of the proximal left cervical ICA - Neurology recommended repeat CTA neck around 06/2023 for surveillance monitoring- ***  PAD  - ABIs in 12/2020 showed moderate right lower extremity  arterial disease  HTN   HLD  - Lipid panel from 12/2022 showed LDL 38  - continue lipitor 40 mg daily   ROS: ***  Studies Reviewed: .        *** Risk Assessment/Calculations:   {Does this patient have ATRIAL FIBRILLATION?:[id number]} No BP recorded.  {Refresh Note OR Click here to enter BP  :1}***       Physical Exam:   VS:  There were no vitals taken for this  visit.   Wt Readings from Last 3 Encounters:  03/11/23 165 lb 6.4 oz (75 kg)  01/03/23 167 lb 8.8 oz (76 kg)  10/23/22 161 lb 12.8 oz (73.4 kg)    GEN: Well nourished, well developed in no acute distress NECK: No JVD; No carotid bruits CARDIAC: ***RRR, no murmurs, rubs, gallops RESPIRATORY:  Clear to auscultation without rales, wheezing or rhonchi  ABDOMEN: Soft, non-tender, non-distended EXTREMITIES:  No edema; No deformity   ASSESSMENT AND PLAN: .   ***    {Are you ordering a CV Procedure (e.g. stress test, cath, DCCV, TEE, etc)?   Press F2        :[id number]}  Dispo: ***  Signed, Jonita Albee, PA-C

## 2023-05-07 DIAGNOSIS — K2950 Unspecified chronic gastritis without bleeding: Secondary | ICD-10-CM | POA: Diagnosis not present

## 2023-05-07 DIAGNOSIS — D508 Other iron deficiency anemias: Secondary | ICD-10-CM | POA: Diagnosis not present

## 2023-05-14 NOTE — Progress Notes (Signed)
Cardiology Office Note:  .   Date:  05/19/2023  ID:  Andres White, DOB 19-Dec-1945, MRN [id number] PCP: Andres Priest, FNP  Andres White Providers Cardiologist:  Andres Harps, MD    History of Present Illness: .   Andres White is a 77 y.o. male with a past medical history of PVCs, VT, chronic diastolic heart failure, history of CVA with residual right sided weakness, carotid artery stenosis, PAD, HTN, HLD, CKD, BPH, gout, dementia. Patient is followed by Andres White and presents today for a 6 month follow up appointment for VT.   Per chart review, patient previously wore a cardiac monitor in 08/2020 that showed 158 episodes of wide-complex tachycardia occurred (favor VT over SVT). Episodes were brief. Patient was not started on BB due to history of bradycardia. Underwent nuclear stress test 08/08/20 that was a normal, low risk study without ischemia or infarction. Patient has a history of PAD and most recent ABIs from 12/2020 indicated moderate right lower extremity arterial disease, noncompressible left lower extremity arteries. Follows with vascular surgery.  Patient was admitted in 09/2022 with sepsis secondary to UTI. Patient had worsening right-sided weakness, so there was concern for CVA. Patient wore a cardiac event monitor that showed NSVT, occasional PVCS (6% burden).   Patient was again admitted in 12/2022 with fatigue, lethargy.  In the ED, labs showed K2.2, mag 1.4.  He was noted to have increased episodes of PVCs, NSVT on telemetry.  Cardiology was consulted for increased ectopy.  After K and mag were repleted, arrhythmia had subsided and patient only had rare PVCs on telemetry.  He underwent echocardiogram on 12/31/2022 that showed EF 60-65%, no regional wall motion abnormalities, moderate LVH, normal RV function  During that admission, patient was found to have a hemoglobin of 5. 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  Patient also had AKI  with creatinine of 1.6, thought to be secondary to urinary retention.  Patient had a Foley catheter placed with improvement in creatinine.  Of note, PT OT was consulted and recommended SNF but patient's daughter chose to take him home.  Palliative care was consulted, but family opted to keep patient full code and continue full scope of treatment.  Patient comes to the office today accompanied by his daughter, grandson.  Patient lives with his daughter, so she is very involved and up-to-date on his care.  He has been taking Lasix every other day.  Denies shortness of breath, orthopnea, ankle edema.  Denies palpitations, dizziness, syncope, near syncope.  He has been on aspirin due to his history of stroke and carotid disease.  He denies any GI bleeding, blood in urine, bleeding otherwise.  His daughter reports that since his hospitalization in May 2024, he has had decreased physical activity.  Reports that after he was discharged from the hospital, he was very weak.  He declined PT, OT.  He now spends most of his day in a wheelchair.  His family is able to help him transition from the chair to the bed and vice versa.  They are trying to encourage him to use the walker a bit.  Notes that since he has been in the wheelchair, he seems to be more tired/sluggish than usual.  Patient is followed by his PCP, neurology.  His next appoint with neurology is in December.  His daughter checks his BP at home and it has been well-controlled.   ROS: Denies chest pain, palpitations, dizziness,  syncope, near syncope, lower extremity edema.  Studies Reviewed: [name]   EKG Interpretation Date/Time:  Tuesday May 19 2023 09:06:03 EDT Ventricular Rate:  75 PR Interval:  204 QRS Duration:  80 QT Interval:  372 QTC Calculation: 415 R Axis:   -73  Text Interpretation: Normal sinus rhythm Left axis deviation Inferior infarct (cited on or before 03-Jan-2023) Anterolateral infarct (cited on or before 03-Jan-2023) When compared  with ECG of 03-Jan-2023 12:28, Fusion complexes are no longer Present Vent. rate has increased BY  27 BPM Right bundle branch block is no longer Present Questionable change in initial forces of Anterolateral leads Confirmed by Andres White [id number]) on 05/19/2023 9:09:11 AMCardiac Studies & Procedures     STRESS TESTS  MYOCARDIAL PERFUSION IMAGING 08/08/2020  Narrative  Nuclear stress EF: 59%.  The left ventricular ejection fraction is normal (55-65%).  The study is normal.  This is a low risk study.  Normal resting and stress perfusion. No ischemia or infarction EF 59%   ECHOCARDIOGRAM  ECHOCARDIOGRAM COMPLETE 12/31/2022  Narrative ECHOCARDIOGRAM REPORT    Patient Name:   Andres White Date of Exam: 12/31/2022 Medical Rec #:  [id number]      Height:       68.0 in Accession #:    [id number]     Weight:       164.9 lb Date of Birth:  Mar 18, 1946      BSA:          1.883 m Patient Age:    58 years       BP:           122/52 mmHg Patient Gender: M              HR:           50 bpm. Exam Location:  Inpatient  Procedure: 2D Echo, Cardiac Doppler and Color Doppler  Indications:    Stroke  History:        Patient has prior history of Echocardiogram examinations, most recent 06/25/2020. CHF, Carotid Disease, CKD, Stroke and PAD, Arrythmias:PVC and Bradycardia; Risk Factors:Hypertension and Current Smoker.  Sonographer:    Wallie Char Referring Phys: [id number] ANASTASSIA DOUTOVA  IMPRESSIONS   1. Left ventricular ejection fraction, by estimation, is 60 to 65%. The left ventricle has normal function. The left ventricle has no regional wall motion abnormalities. There is moderate asymmetric left ventricular hypertrophy of the inferior segment. Left ventricular diastolic parameters are indeterminate. 2. Right ventricular systolic function is normal. The right ventricular size is normal. 3. The mitral valve is grossly normal. No evidence of mitral valve regurgitation. No evidence  of mitral stenosis. 4. The aortic valve is tricuspid. There is mild calcification of the aortic valve. Aortic valve regurgitation is not visualized. Aortic valve sclerosis is present, with no evidence of aortic valve stenosis.  Comparison(s): No significant change from prior study.  FINDINGS Left Ventricle: Left ventricular ejection fraction, by estimation, is 60 to 65%. The left ventricle has normal function. The left ventricle has no regional wall motion abnormalities. The left ventricular internal cavity size was normal in size. There is moderate asymmetric left ventricular hypertrophy of the inferior segment. Left ventricular diastolic parameters are indeterminate.  Right Ventricle: The right ventricular size is normal. No increase in right ventricular wall thickness. Right ventricular systolic function is normal.  Left Atrium: Left atrial size was normal in size.  Right Atrium: Right atrial size was normal in size.  Pericardium: Trivial pericardial effusion is present. The  Cardiology Office Note:  .   Date:  05/19/2023  ID:  Andres White, DOB 19-Dec-1945, MRN [id number] PCP: Andres Priest, FNP  Andres White Providers Cardiologist:  Andres Harps, MD    History of Present Illness: .   Andres White is a 77 y.o. male with a past medical history of PVCs, VT, chronic diastolic heart failure, history of CVA with residual right sided weakness, carotid artery stenosis, PAD, HTN, HLD, CKD, BPH, gout, dementia. Patient is followed by Andres White and presents today for a 6 month follow up appointment for VT.   Per chart review, patient previously wore a cardiac monitor in 08/2020 that showed 158 episodes of wide-complex tachycardia occurred (favor VT over SVT). Episodes were brief. Patient was not started on BB due to history of bradycardia. Underwent nuclear stress test 08/08/20 that was a normal, low risk study without ischemia or infarction. Patient has a history of PAD and most recent ABIs from 12/2020 indicated moderate right lower extremity arterial disease, noncompressible left lower extremity arteries. Follows with vascular surgery.  Patient was admitted in 09/2022 with sepsis secondary to UTI. Patient had worsening right-sided weakness, so there was concern for CVA. Patient wore a cardiac event monitor that showed NSVT, occasional PVCS (6% burden).   Patient was again admitted in 12/2022 with fatigue, lethargy.  In the ED, labs showed K2.2, mag 1.4.  He was noted to have increased episodes of PVCs, NSVT on telemetry.  Cardiology was consulted for increased ectopy.  After K and mag were repleted, arrhythmia had subsided and patient only had rare PVCs on telemetry.  He underwent echocardiogram on 12/31/2022 that showed EF 60-65%, no regional wall motion abnormalities, moderate LVH, normal RV function  During that admission, patient was found to have a hemoglobin of 5. 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  Patient also had AKI  with creatinine of 1.6, thought to be secondary to urinary retention.  Patient had a Foley catheter placed with improvement in creatinine.  Of note, PT OT was consulted and recommended SNF but patient's daughter chose to take him home.  Palliative care was consulted, but family opted to keep patient full code and continue full scope of treatment.  Patient comes to the office today accompanied by his daughter, grandson.  Patient lives with his daughter, so she is very involved and up-to-date on his care.  He has been taking Lasix every other day.  Denies shortness of breath, orthopnea, ankle edema.  Denies palpitations, dizziness, syncope, near syncope.  He has been on aspirin due to his history of stroke and carotid disease.  He denies any GI bleeding, blood in urine, bleeding otherwise.  His daughter reports that since his hospitalization in May 2024, he has had decreased physical activity.  Reports that after he was discharged from the hospital, he was very weak.  He declined PT, OT.  He now spends most of his day in a wheelchair.  His family is able to help him transition from the chair to the bed and vice versa.  They are trying to encourage him to use the walker a bit.  Notes that since he has been in the wheelchair, he seems to be more tired/sluggish than usual.  Patient is followed by his PCP, neurology.  His next appoint with neurology is in December.  His daughter checks his BP at home and it has been well-controlled.   ROS: Denies chest pain, palpitations, dizziness,  syncope, near syncope, lower extremity edema.  Studies Reviewed: [name]   EKG Interpretation Date/Time:  Tuesday May 19 2023 09:06:03 EDT Ventricular Rate:  75 PR Interval:  204 QRS Duration:  80 QT Interval:  372 QTC Calculation: 415 R Axis:   -73  Text Interpretation: Normal sinus rhythm Left axis deviation Inferior infarct (cited on or before 03-Jan-2023) Anterolateral infarct (cited on or before 03-Jan-2023) When compared  with ECG of 03-Jan-2023 12:28, Fusion complexes are no longer Present Vent. rate has increased BY  27 BPM Right bundle branch block is no longer Present Questionable change in initial forces of Anterolateral leads Confirmed by Andres White [id number]) on 05/19/2023 9:09:11 AMCardiac Studies & Procedures     STRESS TESTS  MYOCARDIAL PERFUSION IMAGING 08/08/2020  Narrative  Nuclear stress EF: 59%.  The left ventricular ejection fraction is normal (55-65%).  The study is normal.  This is a low risk study.  Normal resting and stress perfusion. No ischemia or infarction EF 59%   ECHOCARDIOGRAM  ECHOCARDIOGRAM COMPLETE 12/31/2022  Narrative ECHOCARDIOGRAM REPORT    Patient Name:   Andres White Date of Exam: 12/31/2022 Medical Rec #:  [id number]      Height:       68.0 in Accession #:    [id number]     Weight:       164.9 lb Date of Birth:  Mar 18, 1946      BSA:          1.883 m Patient Age:    58 years       BP:           122/52 mmHg Patient Gender: M              HR:           50 bpm. Exam Location:  Inpatient  Procedure: 2D Echo, Cardiac Doppler and Color Doppler  Indications:    Stroke  History:        Patient has prior history of Echocardiogram examinations, most recent 06/25/2020. CHF, Carotid Disease, CKD, Stroke and PAD, Arrythmias:PVC and Bradycardia; Risk Factors:Hypertension and Current Smoker.  Sonographer:    Wallie Char Referring Phys: [id number] ANASTASSIA DOUTOVA  IMPRESSIONS   1. Left ventricular ejection fraction, by estimation, is 60 to 65%. The left ventricle has normal function. The left ventricle has no regional wall motion abnormalities. There is moderate asymmetric left ventricular hypertrophy of the inferior segment. Left ventricular diastolic parameters are indeterminate. 2. Right ventricular systolic function is normal. The right ventricular size is normal. 3. The mitral valve is grossly normal. No evidence of mitral valve regurgitation. No evidence  of mitral stenosis. 4. The aortic valve is tricuspid. There is mild calcification of the aortic valve. Aortic valve regurgitation is not visualized. Aortic valve sclerosis is present, with no evidence of aortic valve stenosis.  Comparison(s): No significant change from prior study.  FINDINGS Left Ventricle: Left ventricular ejection fraction, by estimation, is 60 to 65%. The left ventricle has normal function. The left ventricle has no regional wall motion abnormalities. The left ventricular internal cavity size was normal in size. There is moderate asymmetric left ventricular hypertrophy of the inferior segment. Left ventricular diastolic parameters are indeterminate.  Right Ventricle: The right ventricular size is normal. No increase in right ventricular wall thickness. Right ventricular systolic function is normal.  Left Atrium: Left atrial size was normal in size.  Right Atrium: Right atrial size was normal in size.  Pericardium: Trivial pericardial effusion is present. The  syncope, near syncope, lower extremity edema.  Studies Reviewed: [name]   EKG Interpretation Date/Time:  Tuesday May 19 2023 09:06:03 EDT Ventricular Rate:  75 PR Interval:  204 QRS Duration:  80 QT Interval:  372 QTC Calculation: 415 R Axis:   -73  Text Interpretation: Normal sinus rhythm Left axis deviation Inferior infarct (cited on or before 03-Jan-2023) Anterolateral infarct (cited on or before 03-Jan-2023) When compared  with ECG of 03-Jan-2023 12:28, Fusion complexes are no longer Present Vent. rate has increased BY  27 BPM Right bundle branch block is no longer Present Questionable change in initial forces of Anterolateral leads Confirmed by Andres White [id number]) on 05/19/2023 9:09:11 AMCardiac Studies & Procedures     STRESS TESTS  MYOCARDIAL PERFUSION IMAGING 08/08/2020  Narrative  Nuclear stress EF: 59%.  The left ventricular ejection fraction is normal (55-65%).  The study is normal.  This is a low risk study.  Normal resting and stress perfusion. No ischemia or infarction EF 59%   ECHOCARDIOGRAM  ECHOCARDIOGRAM COMPLETE 12/31/2022  Narrative ECHOCARDIOGRAM REPORT    Patient Name:   Andres White Date of Exam: 12/31/2022 Medical Rec #:  [id number]      Height:       68.0 in Accession #:    [id number]     Weight:       164.9 lb Date of Birth:  Mar 18, 1946      BSA:          1.883 m Patient Age:    58 years       BP:           122/52 mmHg Patient Gender: M              HR:           50 bpm. Exam Location:  Inpatient  Procedure: 2D Echo, Cardiac Doppler and Color Doppler  Indications:    Stroke  History:        Patient has prior history of Echocardiogram examinations, most recent 06/25/2020. CHF, Carotid Disease, CKD, Stroke and PAD, Arrythmias:PVC and Bradycardia; Risk Factors:Hypertension and Current Smoker.  Sonographer:    Wallie Char Referring Phys: [id number] ANASTASSIA DOUTOVA  IMPRESSIONS   1. Left ventricular ejection fraction, by estimation, is 60 to 65%. The left ventricle has normal function. The left ventricle has no regional wall motion abnormalities. There is moderate asymmetric left ventricular hypertrophy of the inferior segment. Left ventricular diastolic parameters are indeterminate. 2. Right ventricular systolic function is normal. The right ventricular size is normal. 3. The mitral valve is grossly normal. No evidence of mitral valve regurgitation. No evidence  of mitral stenosis. 4. The aortic valve is tricuspid. There is mild calcification of the aortic valve. Aortic valve regurgitation is not visualized. Aortic valve sclerosis is present, with no evidence of aortic valve stenosis.  Comparison(s): No significant change from prior study.  FINDINGS Left Ventricle: Left ventricular ejection fraction, by estimation, is 60 to 65%. The left ventricle has normal function. The left ventricle has no regional wall motion abnormalities. The left ventricular internal cavity size was normal in size. There is moderate asymmetric left ventricular hypertrophy of the inferior segment. Left ventricular diastolic parameters are indeterminate.  Right Ventricle: The right ventricular size is normal. No increase in right ventricular wall thickness. Right ventricular systolic function is normal.  Left Atrium: Left atrial size was normal in size.  Right Atrium: Right atrial size was normal in size.  Pericardium: Trivial pericardial effusion is present. The

## 2023-05-18 ENCOUNTER — Ambulatory Visit: Payer: Medicare Other | Admitting: Cardiology

## 2023-05-19 ENCOUNTER — Ambulatory Visit: Payer: Medicare Other | Attending: Cardiovascular Disease | Admitting: Cardiology

## 2023-05-19 ENCOUNTER — Encounter: Payer: Self-pay | Admitting: Cardiology

## 2023-05-19 ENCOUNTER — Ambulatory Visit: Payer: Medicare Other | Admitting: Cardiovascular Disease

## 2023-05-19 ENCOUNTER — Telehealth: Payer: Self-pay

## 2023-05-19 VITALS — BP 124/78 | HR 75 | Ht 67.0 in | Wt 169.0 lb

## 2023-05-19 DIAGNOSIS — I6522 Occlusion and stenosis of left carotid artery: Secondary | ICD-10-CM | POA: Diagnosis not present

## 2023-05-19 DIAGNOSIS — I472 Ventricular tachycardia, unspecified: Secondary | ICD-10-CM | POA: Diagnosis not present

## 2023-05-19 DIAGNOSIS — Z8719 Personal history of other diseases of the digestive system: Secondary | ICD-10-CM | POA: Diagnosis not present

## 2023-05-19 DIAGNOSIS — I5032 Chronic diastolic (congestive) heart failure: Secondary | ICD-10-CM

## 2023-05-19 DIAGNOSIS — I10 Essential (primary) hypertension: Secondary | ICD-10-CM | POA: Diagnosis not present

## 2023-05-19 DIAGNOSIS — I739 Peripheral vascular disease, unspecified: Secondary | ICD-10-CM | POA: Diagnosis not present

## 2023-05-19 DIAGNOSIS — E785 Hyperlipidemia, unspecified: Secondary | ICD-10-CM

## 2023-05-19 DIAGNOSIS — I493 Ventricular premature depolarization: Secondary | ICD-10-CM

## 2023-05-19 DIAGNOSIS — Z8673 Personal history of transient ischemic attack (TIA), and cerebral infarction without residual deficits: Secondary | ICD-10-CM | POA: Diagnosis not present

## 2023-05-19 NOTE — Patient Instructions (Signed)
Medication Instructions:  No Changes *If you need a refill on your cardiac medications before your next appointment, please call your pharmacy*   Lab Work: Today: CBC, BMP, and Mag. If you have labs (blood work) drawn today and your tests are completely normal, you will receive your results only by: MyChart Message (if you have MyChart) OR A paper copy in the mail If you have any lab test that is abnormal or we need to change your treatment, we will call you to review the results.   Testing/Procedures: No Testing   Follow-Up: At [facility], you and your health needs are our priority.  As part of our continuing mission to provide you with exceptional heart care, we have created designated Provider Care Teams.  These Care Teams include your primary Cardiologist (physician) and Advanced Practice Providers (APPs -  Physician Assistants and Nurse Practitioners) who all work together to provide you with the care you need, when you need it.  We recommend signing up for the patient portal called "MyChart".  Sign up information is provided on this After Visit Summary.  MyChart is used to connect with patients for Virtual Visits (Telemedicine).  Patients are able to view lab/test results, encounter notes, upcoming appointments, etc.  Non-urgent messages can be sent to your provider as well.   To learn more about what you can do with MyChart, go to [email or web address].    Your next appointment:   6 month(s)  Provider:   Reatha Harps, MD

## 2023-05-19 NOTE — Telephone Encounter (Signed)
Talked to patient's daughter (okay to talk to) on the blood work that was spilled by [facility]. They said they will try to come back in October 25th a Friday.

## 2023-05-20 LAB — SPECIMEN STATUS REPORT

## 2023-05-21 LAB — BASIC METABOLIC PANEL

## 2023-05-21 LAB — CBC
Hematocrit: 33.5 % — ABNORMAL LOW (ref 37.5–51.0)
Hemoglobin: 10.4 g/dL — ABNORMAL LOW (ref 13.0–17.7)
MCH: 28.7 pg (ref 26.6–33.0)
MCHC: 31 g/dL — ABNORMAL LOW (ref 31.5–35.7)
MCV: 92 fL (ref 79–97)
Platelets: 189 10*3/uL (ref 150–450)
RBC: 3.63 x10E6/uL — ABNORMAL LOW (ref 4.14–5.80)
RDW: 14 % (ref 11.6–15.4)
WBC: 7.2 10*3/uL (ref 3.4–10.8)

## 2023-05-21 LAB — MAGNESIUM

## 2023-05-28 ENCOUNTER — Ambulatory Visit: Payer: Medicare Other | Admitting: Adult Health

## 2023-06-08 ENCOUNTER — Other Ambulatory Visit: Payer: Self-pay | Admitting: Adult Health

## 2023-06-08 ENCOUNTER — Telehealth: Payer: Self-pay | Admitting: Adult Health

## 2023-06-08 DIAGNOSIS — I671 Cerebral aneurysm, nonruptured: Secondary | ICD-10-CM

## 2023-06-08 DIAGNOSIS — I6522 Occlusion and stenosis of left carotid artery: Secondary | ICD-10-CM

## 2023-06-08 NOTE — Telephone Encounter (Signed)
[employer] medicare NPR sent to GI [phone] 

## 2023-06-17 DIAGNOSIS — I10 Essential (primary) hypertension: Secondary | ICD-10-CM | POA: Diagnosis not present

## 2023-06-17 DIAGNOSIS — R54 Age-related physical debility: Secondary | ICD-10-CM | POA: Diagnosis not present

## 2023-06-18 DIAGNOSIS — I5042 Chronic combined systolic (congestive) and diastolic (congestive) heart failure: Secondary | ICD-10-CM | POA: Diagnosis not present

## 2023-06-25 ENCOUNTER — Ambulatory Visit: Payer: Medicare Other | Admitting: Adult Health

## 2023-06-25 ENCOUNTER — Encounter: Payer: Self-pay | Admitting: Adult Health

## 2023-06-25 VITALS — BP 138/89 | HR 70 | Ht 68.0 in | Wt 165.0 lb

## 2023-06-25 DIAGNOSIS — F0150 Vascular dementia without behavioral disturbance: Secondary | ICD-10-CM | POA: Diagnosis not present

## 2023-06-25 DIAGNOSIS — R569 Unspecified convulsions: Secondary | ICD-10-CM

## 2023-06-25 DIAGNOSIS — I671 Cerebral aneurysm, nonruptured: Secondary | ICD-10-CM

## 2023-06-25 DIAGNOSIS — I69398 Other sequelae of cerebral infarction: Secondary | ICD-10-CM

## 2023-06-25 DIAGNOSIS — I639 Cerebral infarction, unspecified: Secondary | ICD-10-CM | POA: Diagnosis not present

## 2023-06-25 DIAGNOSIS — I6522 Occlusion and stenosis of left carotid artery: Secondary | ICD-10-CM | POA: Diagnosis not present

## 2023-06-25 NOTE — Progress Notes (Signed)
[facility] [address] [place].  [address] [phone]       OFFICE FOLLOW UP VISIT NOTE  [name] Date of Birth:  11-22-1945 Medical Record Number:  [id number]   Referring MD: Delle Reining, PA-C Reason for Referral: Stroke, seizures and dementia    Chief Complaint  Patient presents with   Follow-up    Patient in room #3 with his daughter and granddaughter. Patient state he is well and stable, with no new concerns.     HPI:   Update 06/25/2023 JM: Patient returns for follow-up visit accompanied by his daughter and granddaughter.  Daughter provides majority of history.  Reports overall he has been doing well. Denies new stroke/TIA symptoms. Some improvement noted on left side, continued right sided weakness. Is able to ambulate short distance with assistance and RW but frequently refuses to ambulate, transfers primarily via wheelchair.  Never worked with home health therapy as he declined.  Reports cognition has been stable but can have good days and bad days.  Has very supportive family who assists with ADLs.  Denies any seizure activity, compliant on Keppra without side effects.  Compliant on aspirin and atorvastatin.  Routinely follows with PCP for stroke risk factor management.  Recently ordered repeat CTA head/neck for surveillance monitoring of cerebral aneurysm and left carotid stenosis but patient has been refusing to have testing completed.  No questions or concerns at this time.     History provided for reference purposes only Update 03/11/2023 JM: Patient returns for follow-up visit accompanied by his daughter and grandchildren.  Previously seen by Dr. Pearlean Brownie in January for significant decline in functioning following a cold.  Urinalysis suggestive of UTI and blood work suggestive of worsening kidney function, advised to follow-up with PCP for further treatment recommendations.  He was seen by his PCP on 1/26 who advised further ER  evaluation.  He was admitted for 6 days with evidence of sepsis likely from UTI, AKI (resolved at time of discharge), and hypokalemia (resolved at discharge).  Gabapentin and citalopram discontinued.  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  CTA showed progression of left ICA at 80% as well as 5 mm anterior communicating artery aneurysm.  EEG no seizures or epileptiform activity, continued on Keppra 500 mg twice daily.  Also noted GI bleed with severe anemia, held aspirin and recommended consideration of aspirin or Plavix monotherapy once anemia improves and cleared from GI.  Patient also treated for UTI and COVID infection by primary team.  Goals of care discussion in setting of multiple comorbidities, advanced age and overall poor prognosis - family opted to keep patient full code and continue full scope of treatment.  Therapies recommended SNF although daughter opted to bring patient home as he was at baseline functioning.    Daughter provides majority of history.  He has been doing well since discharge.  No new stroke/TIA symptoms.  Continued right-sided weakness which has been stable. Daughter has noted some mild left-sided weakness and balance difficulties since recent stroke. Also notes slight decline in cognition. Able to take a few steps with daughters assistance and use of RW, use of w/c for long distance. Completed HH therapies just prior to above hospitalization, has not had any therapy since discharge.  Continues on Seroquel which  helps with mood and sleep.  No significant behavioral concerns.    Has since restarted aspirin without any bleeding concerns, also continues on atorvastatin.  Blood pressure today elevated, asymptomatic, routinely monitors at  home and typically 120s/80s.  Routinely followed by PCP who does home visits.  Denies any seizure activity, remains on Keppra 500 mg twice daily   Update 08/26/2022 Dr. Pearlean Brownie: Patient is seen today urgently upon request from his daughter because significant general decline in his condition following a code 3 weeks ago.  Patient's current symptoms seem to have subsided but he is become weak all over with inability to get up and stand or walk.  He is also been mentally quite slow and increased.  He can be aroused but no energy to do anything.  His baseline right-sided weakness also appears more pronounced.  He can hardly stay awake.[name]  He can barely open his eyes and then falls asleep.  He remains on gabapentin 300 mg 3 times daily as well as Eliquis 50 mg at night.  Patient has also not been eating well and may have lost some weight.  Has an upcoming appointment to see his primary care physician coming Friday.  He has not had any recent lab work or brain imaging study done.  Noticed hemiplegia, slurred speech diplopia or other strokelike symptoms.   Update 08/05/2022 JM: Patient returns for 79-month follow-up.  Daughter reports episode of slurred speech and left hand numbness about 1 month upon awakening that lasted about 30 seconds then completely resolved. Daughter unsure if symptoms from just waking up or sleeping on hand wrong. Patient declined going to ED at that time. Per daughter, he was able to answer questions and follow commands. Did not lose consciousness.  Denies any weakness, visual changes or cognitive changes. No reoccuring or new stroke/TIA symptoms since that time.    Residual right-sided weakness and cognitive impairment.  Use of RW at all times, no falls. Daughter reports some mild gradual decline in memory since last visit. MMSE today 13/30 (prior 11/30). Daughter continues to provide 24/7 supervision and assistance for ADLs and IADLs.  Remains on aspirin and atorvastatin.  Blood pressure  today 158/93.  Routinely follows with PCP. Has f/u next week, plans on repeat lab work.   Denies any seizure activity, remains on Keppra 500 mg BID. Has since stopped Dilantin.  No further concerns at this time  Update 01/20/2022 JM: Patient returns for stroke, seizure and dementia follow-up after prior visit almost 1 year ago (requested 20-month follow-up).  He is accompanied by his daughter who provides majority of history.  Stable from stroke standpoint, denies new stroke/TIA symptoms.  Residual right-sided weakness and cognitive impairment stable. Will use RW at all times, no recent falls.  Compliant on aspirin and atorvastatin, denies side effects.  Blood pressure today 150/82.   Cognition has been stable since prior visit.  MMSE today 11/30 (prior 11/30).  Continues to live with his daughter who provides 24/7 supervision and assistance for majority of ADLs.  Recent lab work with PCP showed low B12 at 176 and was started on oral B12 supplement.  Denies any seizure activity.  Discussed weaning off of Dilantin at prior visit but he has since remained on both Keppra and Dilantin due to miscommunication.  Denies side effects on medications.  He has since establish care with new PCP, he was referred to urologist due to elevated PSA.  Daughter also concerned regarding frequent urination, questions if this is due to  furosemide.  This is currently being managed by cardiology which was started back in 10/2020 for edema.  Daughter was encouraged to ensure evaluation with urology scheduled for further discussion as well as with cardiology for furosemide management  No further concerns at this time  Update 02/25/2021 JM: Mr. Crick returns for 16-month stroke and seizure follow-up accompanied by his daughter who provides history.  He has been stable since prior visit without new stroke/TIA symptoms or seizure activity.  Reports residual right sided weakness and cognitive impairment- was working with [facility]  therapies but stopped mid Spring due to short staffing.  He does try to stay active at home but not necessarily any type of routine physical or mental exercises.  He ambulates with rolling walker - did have 1 fall when he was not using walker thankfully without injury.  He continues to live with his daughter who provides 24/7 supervision as well as assistance for ADLs.  Cognition has been generally stable but fluctuates per daughter.  On gabapentin 300 mg TID per PMR Dr. Carlis Abbott for neuropathy pain.  Compliant on aspirin and atorvastatin without associated side effects.  Blood pressure today 151/87.  Compliant on Keppra and Dilantin tolerating without side effects.  No further concerns at this time.  Update 11/08/2020 Dr. Pearlean Brownie: He returns for follow-up after last visit 3 months ago.  Patient is referred back to see me because of new problems of seizures which required admission to [facility] on 09/16/2020.  Patient will at home when family witnessed that he was not acting his usual self and was gazing upwards.  When EMS arrived they noticed a left gaze deviation and unresponsiveness to voice and following commands and possibly some left-sided weakness.  Patient was initially brought in as a code stroke but while being evaluated was noticed is likely having a seizure because he had tonic gaze deviation with a cry and stiffening of the whole body followed by generalized shaking which lasted about a minute.  He was given IV Ativan which stopped the episode.  He was also loaded with IV Keppra.  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  EEG done on 09/17/2020 and 09/18/2020 showed moderate diffuse encephalopathy there were several episodes of even better being pushed by the staff when they noticed tremulous movements on the right but these did not have any  electrographic correlates for seizures.  Patient was intubated and IV phenytoin was added to the Keppra with the got better and self extubated himself.  He finished long-term EEG monitoring which showed no definite seizure activity.  MRI scan of the brain on 09/20/2020 was motion degraded but showed no acute abnormality.  Showed evidence of old infarcts bilaterally.  Patient did have some cognitive impairment following his previous stroke when the daughter feels that this has gotten worse now.  He requires constant supervision.  He cannot be left alone.  He is able to walk with a walker but he is disoriented at baseline and requires help with most activities of daily living.  He has had no recurrent seizures of focal jerking since his being home.  He is still on the current dosages of Keppra finder milligram twice daily and phenytoin 100 mg   3 times a day.  Is had no recurrent stroke or TIA symptoms and remains on aspirin she is tolerating well.  Blood pressure is well controlled today it is slightly elevated 160/82.  Is tolerating Lipitor well without muscle aches and pains.  Initial visit 08/30/2020 Dr. Pearlean Brownie: Mr. Sallyanne Kuster is a 77 year old African-American male seen today for initial office consultation visit for stroke.  Is accompanied by his daughter who provides history.  I also reviewed electronic medical records and imaging films in PACS.  He has a past medical history of hypertension, tobacco abuse, arthritis who presented to [facility] on [facility]/25/2020 with right leg weakness for 4 days prior to admission.  He was found to have mild right hand and more leg weakness.  MRI scan of the brain showed a left midbrain lacunar infarct.  Carotid ultrasound suggested moderate 70 to 90% left ICA stenosis which is asymptomatic.  Echocardiogram showed normal ejection fraction without cardiac source of embolism.  LDL cholesterol was 32 mg percent.  Hemoglobin A1c was 4.8.  Lower extremity venous  Dopplers were negative.  Patient subsequently had 2 weeks external cardiac monitor done which was negative for paroxysmal A. fib.  Patient was started on aspirin 81 mg daily which he is tolerating well without bruising or bleeding.  He went to inpatient rehab for 4 weeks and subsequently is at home living with her daughter.  Is able to ambulate independently now though he still has some diminished fine motor skills and mild right leg dragging.  His speech is improved.  He however has significant memory loss and cognitive impairment following his stroke as per the daughter.  He is quite forgetful and cannot remember recent conversations and needs constant reminders.  He was previously living independently and managing his own affairs but daughter feels he is no longer able to do so.  Patient states he is cut back smoking significantly but still smokes a few cigarettes a day.  His blood pressure is well controlled and today it is 142/86.  There is no family history of dementia.  Patient has no prior history of seizures or significant neurological problems.   ROS:   14 system review of systems is positive for those listed in HPI and all other systems negative.     PMH:  Past Medical History:  Diagnosis Date   Arthritis    Chronic back pain    Duodenal adenoma    Gout    Hypertension    Stroke (HCC) 06/2020   Syncope and collapse     Social History:  Social History   Socioeconomic History   Marital status: Single    Spouse name: Not on file   Number of children: 1   Years of education: 12   Highest education level: 12th grade  Occupational History   Not on file  Tobacco Use   Smoking status: Every Day    Current packs/day: 0.50    Average packs/day: 0.5 packs/day for 20.0 years (10.0 ttl pk-yrs)    Types: Cigarettes    Passive exposure: Current   Smokeless tobacco: Never   Tobacco comments:    Verified by Daughter - Blima Ledger, 2 cigarettes per day 10-23-22  Vaping Use   Vaping  status: Never Used  Substance and Sexual Activity   Alcohol use: Not Currently    Comment: occasional   Drug use: No   Sexual activity: Not Currently  Other Topics Concern   Not on file  Social History Narrative   Lives with daughter, son in law and 2 grandchildren   Right Handed   Drinks 1 cup caffeine 4  times a week-ish   Social Determinants of Health   Financial Resource Strain: Low Risk  (03/31/2022)   Overall Financial Resource Strain (CARDIA)    Difficulty of Paying Living Expenses: Not hard at all  Food Insecurity: No Food Insecurity (01/01/2023)   Hunger Vital Sign    Worried About Running Out of Food in the Last Year: Never true    Ran Out of Food in the Last Year: Never true  Transportation Needs: No Transportation Needs (01/01/2023)   PRAPARE - Administrator, Civil Service (Medical): No    Lack of Transportation (Non-Medical): No  Physical Activity: Insufficiently Active (10/21/2022)   Exercise Vital Sign    Days of Exercise per Week: 1 day    Minutes of Exercise per Session: 10 min  Stress: Stress Concern Present (10/21/2022)   [employer] of Occupational Health - Occupational Stress Questionnaire    Feeling of Stress : To some extent  Social Connections: Socially Isolated (03/31/2022)   Social Connection and Isolation Panel [NHANES]    Frequency of Communication with Friends and Family: More than three times a week    Frequency of Social Gatherings with Friends and Family: More than three times a week    Attends Religious Services: Never    Database administrator or Organizations: No    Attends Banker Meetings: Never    Marital Status: Divorced  Catering manager Violence: Patient Unable To Answer (01/01/2023)   Humiliation, Afraid, Rape, and Kick questionnaire    Fear of Current or Ex-Partner: Patient unable to answer    Emotionally Abused: Patient unable to answer    Physically Abused: Patient unable to answer    Sexually Abused:  Patient unable to answer    Medications:   Current Outpatient Medications on File Prior to Visit  Medication Sig Dispense Refill   allopurinol (ZYLOPRIM) 100 MG tablet Take 1 tablet (100 mg total) by mouth daily. TAKE 1 TABLET(100 MG) BY MOUTH DAILY 90 tablet 3   aspirin EC 81 MG tablet Take 1 tablet (81 mg total) by mouth daily. Resume on 6/4 90 tablet 3   atorvastatin (LIPITOR) 40 MG tablet TAKE 1 TABLET BY MOUTH ONCE DAILY 30 tablet 5   citalopram (CELEXA) 10 MG tablet Take 10 mg by mouth daily.     colchicine 0.6 MG tablet Take 0.6 mg by mouth daily.     ferrous sulfate 300 (60 Fe) MG/5ML syrup Take 5 mLs (300 mg total) by mouth daily with breakfast. 150 mL 3   furosemide (LASIX) 20 MG tablet Take 1 tablet (20 mg total) by mouth every morning. (Patient taking differently: Take 20 mg by mouth every other day.) 60 tablet 0   levETIRAcetam (KEPPRA) 500 MG tablet Take 1 tablet (500 mg total) by mouth 2 (two) times daily. 180 tablet 3   Melatonin-Pyridoxine (MELATIN PO) Take by mouth.     Multiple Vitamin (MULTI VITAMIN) TABS 1 tablet Orally Once a day     Multiple Vitamin (MULTIVITAMIN WITH MINERALS) TABS tablet Take 1 tablet by mouth daily. (Patient taking differently: Take 1 tablet by mouth daily at 12 noon.)     QUEtiapine (SEROQUEL) 50 MG tablet Take 50 mg by mouth at bedtime.     tamsulosin (FLOMAX) 0.4 MG CAPS capsule Take 1 capsule (0.4 mg total) by mouth daily after supper. 60 capsule 8   traMADol (ULTRAM) 50 MG tablet Take 1 tablet (50 mg total) by mouth 2 (two) times daily as  needed. 60 tablet 5   potassium chloride (KLOR-CON) 20 MEQ packet Take 40 mEq by mouth daily for 14 days. 28 each 0   sucralfate (CARAFATE) 1 g tablet Take 1 tablet (1 g total) by mouth 2 (two) times daily. 60 tablet 0   No current facility-administered medications on file prior to visit.    Allergies:  No Known Allergies  Physical Exam Today's Vitals   06/25/23 0857  BP: (!) 144/90  Pulse: 70  Weight:  165 lb (74.8 kg)  Height: 5\' 8"  (1.727 m)   Body mass index is 25.09 kg/m.   General: Frail very pleasant elderly African-American male, seated, in no evident distress  Neurologic Exam Mental Status: Awake and fully alert.  Unable to appreciate dysarthria or aphasia although limited conversation, daughter provides majority of history.  Able to follow commands without difficulty. Cranial Nerves: Pupils equal, briskly reactive to light. Extraocular movements full without nystagmus. Visual fields full to confrontation. Hearing intact. Facial sensation intact.  Mild left lower facial weakness.  Tongue, palate moves normally and symmetrically.  Motor: Normal bulk and tone. RUE 4+/5, LUE 5/5 proximal, weak grip strength and difficulty making fist.  Decreased hand dexterity R>L.  R HF weakness, right ankle dorsiflexion and plantarflexion weakness (chronic), very slight LLE weakness Sensory.: intact to touch , pinprick , position and vibratory sensation.  Coordination: Rapid alternating movements impaired bilaterally.   Gait and Station: Deferred Reflexes: 1+ and symmetric. Toes downgoing.        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  Vascular risk factors include left ICA stenosis, HTN, HLD, CAD, CHF and PAD. 12/2022 hospitalization with incidental finding of 5 mm ACA aneurysm    Ischemic strokes as above:  Residual deficit: Right > left hemiparesis, gait impairement and cognitive impairment. Declined HH therapy. Discussed increasing physical activity as tolerated and as safety permits and to call office if interested in doing therapy in the  future Continue aspirin 81 mg daily and atorvastatin for secondary stroke prevention managed by PCP/cardiology Close PCP f/u for aggressive stroke risk factor management including BP goal<130/90, and HLD with LDL goal<70  Vascular dementia:  Subjectively stable since prior visit Repeat MMSE at f/u visit Could consider use of Namenda in the future if needed Continue Seroquel at night for mood and sleep managed by PCP Discussed importance of routine memory exercises at home as well as routine physical exercise (as tolerated), healthy diet, adequate sleep and management of risk factors.  Daughter continues to provide 24/7 supervision and care  Seizures:  Stable without new seizure activity.   Continue Keppra 500 mg twice daily EEG 04/2022 no evidence of seizure activity (completed after discontinuing Dilantin)  Left ICA stenosis, asymptomatic L ICA 80% stenosis per CTA 12/2022, prior CTA 70% 09/2020 Order placed for repeat CTA on 11/4, patient previously refusing, discussed indication for completing imaging and patient agreed to pursue, daughter will call [place] imaging to schedule Continue medical therapy for now, if progresses will refer to [facility] for ongoing monitoring  Cerebral aneurysm, unruptured Noted 5 mm biloped ACA aneurysm on CTA head 12/2022 (not mentioned on prior CTA in 2022) Previously followed by IR Dr. Corliss Skains but patient would not be interested in any  intervention if there became a need  Repeat CTA ordered for surveillance monitoring (see above)    Follow-up in 6 months or call earlier if needed   CC:  Scism, Courtney Paris, FNP   I spent 30 minutes of face-to-face and non-face-to-face time with patient and daughter.  This included previsit chart review including review of recent hospitalization, lab review, study review, order entry, electronic health record documentation, patient and daughter education and discussion regarding above diagnoses and treatment plan and  answered all the questions to patient and daughter satisfaction  Ihor Austin, AGNP-BC  [facility] [address] [place], [place] [address]  Phone [phone] Fax [phone] Note: This document was prepared with digital dictation and possible smart phrase technology. Any transcriptional errors that result from this process are unintentional.

## 2023-06-25 NOTE — Patient Instructions (Signed)
Try to ambulate as much as possible as long as safety permits. Please let me know if you would like to participate in any type of therapy   Please contact consider imaging to schedule CTA head/neck - imaging recommended to follow up on left carotid stenosis (narrowing) and cerebral aneurysm to ensure no worsening of either conditions   Continue Keppra 500 mg twice daily for seizure prevention  Continue aspirin 81 mg daily  and atorvastatin  for secondary stroke prevention  Continue to follow up with PCP regarding cholesterol and blood pressure management  Maintain strict control of hypertension with blood pressure goal below 130/90, diabetes with hemoglobin A1c goal below 7.0 % and cholesterol with LDL cholesterol (bad cholesterol) goal below 70 mg/dL.   Signs of a Stroke? Follow the BEFAST method:  Balance Watch for a sudden loss of balance, trouble with coordination or vertigo Eyes Is there a sudden loss of vision in one or both eyes? Or double vision?  Face: Ask the person to smile. Does one side of the face droop or is it numb?  Arms: Ask the person to raise both arms. Does one arm drift downward? Is there weakness or numbness of a leg? Speech: Ask the person to repeat a simple phrase. Does the speech sound slurred/strange? Is the person confused ? Time: If you observe any of these signs, call 911.     Followup in the future with me in 6 months or call earlier if needed      Thank you for coming to see [place] at [facility]. I hope we have been able to provide you high quality care today.  You may receive a patient satisfaction survey over the next few weeks. We would appreciate your feedback and comments so that we may continue to improve ourselves and the health of our patients.

## 2023-07-07 ENCOUNTER — Ambulatory Visit: Payer: Medicare Other | Admitting: Adult Health

## 2023-07-16 DIAGNOSIS — I129 Hypertensive chronic kidney disease with stage 1 through stage 4 chronic kidney disease, or unspecified chronic kidney disease: Secondary | ICD-10-CM | POA: Diagnosis not present

## 2023-07-16 DIAGNOSIS — N390 Urinary tract infection, site not specified: Secondary | ICD-10-CM | POA: Diagnosis not present

## 2023-08-04 DIAGNOSIS — I10 Essential (primary) hypertension: Secondary | ICD-10-CM | POA: Diagnosis not present

## 2023-08-04 DIAGNOSIS — R54 Age-related physical debility: Secondary | ICD-10-CM | POA: Diagnosis not present

## 2023-08-04 DIAGNOSIS — D508 Other iron deficiency anemias: Secondary | ICD-10-CM | POA: Diagnosis not present

## 2023-08-13 DIAGNOSIS — I130 Hypertensive heart and chronic kidney disease with heart failure and stage 1 through stage 4 chronic kidney disease, or unspecified chronic kidney disease: Secondary | ICD-10-CM | POA: Diagnosis not present

## 2023-08-27 DIAGNOSIS — I10 Essential (primary) hypertension: Secondary | ICD-10-CM | POA: Diagnosis not present

## 2023-08-27 DIAGNOSIS — E785 Hyperlipidemia, unspecified: Secondary | ICD-10-CM | POA: Diagnosis not present

## 2023-08-27 DIAGNOSIS — R54 Age-related physical debility: Secondary | ICD-10-CM | POA: Diagnosis not present

## 2023-09-22 ENCOUNTER — Ambulatory Visit: Payer: Medicare Other | Admitting: Adult Health

## 2023-09-24 DIAGNOSIS — C7951 Secondary malignant neoplasm of bone: Secondary | ICD-10-CM | POA: Diagnosis not present

## 2023-09-29 DIAGNOSIS — R54 Age-related physical debility: Secondary | ICD-10-CM | POA: Diagnosis not present

## 2023-09-29 DIAGNOSIS — E785 Hyperlipidemia, unspecified: Secondary | ICD-10-CM | POA: Diagnosis not present

## 2023-09-29 DIAGNOSIS — I10 Essential (primary) hypertension: Secondary | ICD-10-CM | POA: Diagnosis not present

## 2023-10-15 DIAGNOSIS — R54 Age-related physical debility: Secondary | ICD-10-CM | POA: Diagnosis not present

## 2023-10-15 DIAGNOSIS — I10 Essential (primary) hypertension: Secondary | ICD-10-CM | POA: Diagnosis not present

## 2023-10-15 DIAGNOSIS — D508 Other iron deficiency anemias: Secondary | ICD-10-CM | POA: Diagnosis not present

## 2023-11-12 DIAGNOSIS — I5042 Chronic combined systolic (congestive) and diastolic (congestive) heart failure: Secondary | ICD-10-CM | POA: Diagnosis not present

## 2023-12-02 DIAGNOSIS — I10 Essential (primary) hypertension: Secondary | ICD-10-CM | POA: Diagnosis not present

## 2023-12-02 DIAGNOSIS — R54 Age-related physical debility: Secondary | ICD-10-CM | POA: Diagnosis not present

## 2023-12-02 DIAGNOSIS — D508 Other iron deficiency anemias: Secondary | ICD-10-CM | POA: Diagnosis not present

## 2023-12-08 DIAGNOSIS — I10 Essential (primary) hypertension: Secondary | ICD-10-CM | POA: Diagnosis not present

## 2023-12-08 DIAGNOSIS — E785 Hyperlipidemia, unspecified: Secondary | ICD-10-CM | POA: Diagnosis not present

## 2023-12-08 DIAGNOSIS — R54 Age-related physical debility: Secondary | ICD-10-CM | POA: Diagnosis not present

## 2023-12-08 NOTE — Progress Notes (Deleted)
 [facility] [address] [place]. [place] [address] [phone]       OFFICE FOLLOW UP VISIT NOTE  Andres White Date of Birth:  [birth year] Medical Record Number:  [id number]   Primary neurologist: Dr. Janett Medin Reason for visit: Stroke, seizures and dementia    No chief complaint on file.    HPI:   Update 12/09/2023 JM: Patient returns for follow-up visit accompanied by his daughter who provides history.  Has been stable since prior visit.  Continued right > left sided weakness, continues to transfer via wheelchair.   No seizure activity, remains on Keppra  without side effects.  Also remains on aspirin  and atorvastatin  without side effects.  Continues to follow-up with PCP for stroke risk factor management.        History provided for reference purposes only Update 06/25/2023 JM: Patient returns for follow-up visit accompanied by his daughter and granddaughter.  Daughter provides majority of history.  Reports overall he has been doing well. Denies new stroke/TIA symptoms. Some improvement noted on left side, continued right sided weakness. Is able to ambulate short distance with assistance and RW but frequently refuses to ambulate, transfers primarily via wheelchair.  Never worked with home health therapy as he declined.  Reports cognition has been stable but can have good days and bad days.  Has very supportive family who assists with ADLs.  Denies any seizure activity, compliant on Keppra  without side effects.  Compliant on aspirin  and atorvastatin .  Routinely follows with PCP for stroke risk factor management.  Recently ordered repeat CTA head/neck for surveillance monitoring of cerebral aneurysm and left carotid stenosis but patient has been refusing to have testing completed.  No questions or concerns at this time.  Update 03/11/2023 JM: Patient returns for follow-up visit accompanied by his daughter and grandchildren.  Previously seen by Dr. Janett Medin in January  for significant decline in functioning following a cold.  Urinalysis suggestive of UTI and blood work suggestive of worsening kidney function, advised to follow-up with PCP for further treatment recommendations.  He was seen by his PCP on 1/26 who advised further ER evaluation.  He was admitted for 6 days with evidence of sepsis likely from UTI, AKI (resolved at time of discharge), and hypokalemia (resolved at discharge).  Gabapentin  and citalopram  discontinued.  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  CTA showed progression of left ICA at 80% as well as 5 mm anterior communicating artery aneurysm.  EEG no seizures or epileptiform activity, continued on Keppra  500 mg twice daily.  Also noted GI bleed with severe anemia, held aspirin  and recommended consideration of aspirin  or Plavix monotherapy once anemia improves and cleared from GI.  Patient also treated for UTI and COVID infection by primary team.  Goals of care discussion in setting of multiple comorbidities, advanced age and overall poor prognosis - family opted to keep patient full code and continue full scope of treatment.  Therapies recommended SNF although daughter opted to bring patient home as he was at baseline functioning.    Daughter provides majority of history.  He has been doing well since discharge.  No new stroke/TIA symptoms.  Continued right-sided weakness which has been stable. Daughter has noted some mild left-sided weakness and balance difficulties since recent stroke. Also notes slight decline  in cognition. Able to take a few steps with daughters assistance and use of RW, use of w/c for long distance. Completed HH therapies just prior to above hospitalization, has not had any therapy since  discharge.  Continues on Seroquel  which helps with mood and sleep.  No significant behavioral concerns.    Has since restarted aspirin  without any bleeding concerns, also continues on atorvastatin .  Blood pressure today elevated, asymptomatic, routinely monitors at home and typically 120s/80s.  Routinely followed by PCP who does home visits.  Denies any seizure activity, remains on Keppra  500 mg twice daily   Update 08/26/2022 Dr. Janett Medin: Patient is seen today urgently upon request from his daughter because significant general decline in his condition following a code 3 weeks ago.  Patient's current symptoms seem to have subsided but he is become weak all over with inability to get up and stand or walk.  He is also been mentally quite slow and increased.  He can be aroused but no energy to do anything.  His baseline right-sided weakness also appears more pronounced.  He can hardly stay awake.Andres White  He can barely open his eyes and then falls asleep.  He remains on gabapentin  300 mg 3 times daily as well as Eliquis 50 mg at night.  Patient has also not been eating well and may have lost some weight.  Has an upcoming appointment to see his primary care physician coming Friday.  He has not had any recent lab work or brain imaging study done.  Noticed hemiplegia, slurred speech diplopia or other strokelike symptoms.   Update 08/05/2022 JM: Patient returns for 29-month follow-up.  Daughter reports episode of slurred speech and left hand numbness about 1 month upon awakening that lasted about 30 seconds then completely resolved. Daughter unsure if symptoms from just waking up or sleeping on hand wrong. Patient declined going to ED at that time. Per daughter, he was able to answer questions and follow commands. Did not lose consciousness.  Denies any weakness, visual changes or cognitive changes. No reoccuring or new stroke/TIA symptoms since that time.    Residual right-sided weakness and cognitive impairment.  Use of  RW at all times, no falls. Daughter reports some mild gradual decline in memory since last visit. MMSE today 13/30 (prior 11/30). Daughter continues to provide 24/7 supervision and assistance for ADLs and IADLs.  Remains on aspirin  and atorvastatin .  Blood pressure today 158/93.  Routinely follows with PCP. Has f/u next week, plans on repeat lab work.   Denies any seizure activity, remains on Keppra  500 mg BID. Has since stopped Dilantin .  No further concerns at this time  Update 01/20/2022 JM: Patient returns for stroke, seizure and dementia follow-up after prior visit almost 1 year ago (requested 81-month follow-up).  He is accompanied by his daughter who provides majority of history.  Stable from stroke standpoint, denies new stroke/TIA symptoms.  Residual right-sided weakness and cognitive impairment stable. Will use RW at all times, no recent falls.  Compliant on aspirin  and atorvastatin , denies side effects.  Blood pressure today 150/82.   Cognition has been stable since prior visit.  MMSE today 11/30 (prior 11/30).  Continues to live with his daughter who provides 24/7 supervision and assistance for majority of ADLs.  Recent lab work with PCP showed low B12 at 176 and was started on oral B12 supplement.  Denies any seizure activity.  Discussed weaning off of Dilantin  at prior visit but he has since remained on both Keppra  and  Dilantin  due to miscommunication.  Denies side effects on medications.  He has since establish care with new PCP, he was referred to urologist due to elevated PSA.  Daughter also concerned regarding frequent urination, questions if this is due to furosemide .  This is currently being managed by cardiology which was started back in 10/2020 for edema.  Daughter was encouraged to ensure evaluation with urology scheduled for further discussion as well as with cardiology for furosemide  management  No further concerns at this time  Update 02/25/2021 JM: Andres White returns for  1-month stroke and seizure follow-up accompanied by his daughter who provides history.  He has been stable since prior visit without new stroke/TIA symptoms or seizure activity.  Reports residual right sided weakness and cognitive impairment- was working with HH therapies but stopped mid Spring due to short staffing.  He does try to stay active at home but not necessarily any type of routine physical or mental exercises.  He ambulates with rolling walker - did have 1 fall when he was not using walker thankfully without injury.  He continues to live with his daughter who provides 24/7 supervision as well as assistance for ADLs.  Cognition has been generally stable but fluctuates per daughter.  On gabapentin  300 mg TID per PMR Dr. Alessandra Ancona for neuropathy pain.  Compliant on aspirin  and atorvastatin  without associated side effects.  Blood pressure today 151/87.  Compliant on Keppra  and Dilantin  tolerating without side effects.  No further concerns at this time.  Update 11/08/2020 Dr. Janett Medin: He returns for follow-up after last visit 3 months ago.  Patient is referred back to see me because of new problems of seizures which required admission to [facility] on 09/16/2020.  Patient will at home when family witnessed that he was not acting his usual self and was gazing upwards.  When EMS arrived they noticed a left gaze deviation and unresponsiveness to voice and following commands and possibly some left-sided weakness.  Patient was initially brought in as a code stroke but while being evaluated was noticed is likely having a seizure because he had tonic gaze deviation with a cry and stiffening of the whole body followed by generalized shaking which lasted about a minute.  He was given IV Ativan  which stopped the episode.  He was also loaded with IV Keppra .  CT angiogram of the head and neck showed calcified plaque at both carotid bifurcation with 70% proximal left ICA stenosis and 50% stenosis at the origin of the  nondominant right vertebral.  EEG done on 09/17/2020 and 09/18/2020 showed moderate diffuse encephalopathy there were several episodes of even better being pushed by the staff when they noticed tremulous movements on the right but these did not have any electrographic correlates for seizures.  Patient was intubated and IV phenytoin  was added to the Keppra  with the got better and self extubated himself.  He finished long-term EEG monitoring which showed no definite seizure activity.  MRI scan of the brain on 09/20/2020 was motion degraded but showed no acute abnormality.  Showed evidence of old infarcts bilaterally.  Patient did have some cognitive impairment following his previous stroke when the daughter feels that this has gotten worse now.  He requires constant supervision.  He cannot be left alone.  He is able to walk with a walker but he is disoriented at baseline and requires help with most activities of daily living.  He has had no recurrent seizures of focal jerking since his being home.  He is still on the current dosages of Keppra  finder milligram twice daily and phenytoin  100 mg   3 times a day.  Is had no recurrent stroke or TIA symptoms and remains on aspirin  she is tolerating well.  Blood pressure is well controlled today it is slightly elevated 160/82.  Is tolerating Lipitor well without muscle aches and pains.  Initial visit 08/30/2020 Dr. Janett Medin: [name] is a 78 year old African-American male seen today for initial office consultation visit for stroke.  Is accompanied by his daughter who provides history.  I also reviewed electronic medical records and imaging films in PACS.  He has a past medical history of hypertension, tobacco abuse, arthritis who presented to [facility] on 06/25/2020 with right leg weakness for 4 days prior to admission.  He was found to have mild right hand and more leg weakness.  MRI scan of the brain showed a left midbrain lacunar infarct.  Carotid  ultrasound suggested moderate 70 to 90% left ICA stenosis which is asymptomatic.  Echocardiogram showed normal ejection fraction without cardiac source of embolism.  LDL cholesterol was 32 mg percent.  Hemoglobin A1c was 4.8.  Lower extremity venous Dopplers were negative.  Patient subsequently had 2 weeks external cardiac monitor done which was negative for paroxysmal A. fib.  Patient was started on aspirin  81 mg daily which he is tolerating well without bruising or bleeding.  He went to inpatient rehab for 4 weeks and subsequently is at home living with her daughter.  Is able to ambulate independently now though he still has some diminished fine motor skills and mild right leg dragging.  His speech is improved.  He however has significant memory loss and cognitive impairment following his stroke as per the daughter.  He is quite forgetful and cannot remember recent conversations and needs constant reminders.  He was previously living independently and managing his own affairs but daughter feels he is no longer able to do so.  Patient states he is cut back smoking significantly but still smokes a few cigarettes a day.  His blood pressure is well controlled and today it is 142/86.  There is no family history of dementia.  Patient has no prior history of seizures or significant neurological problems.   ROS:   14 system review of systems is positive for those listed in HPI and all other systems negative.     PMH:  Past Medical History:  Diagnosis Date   Arthritis    Chronic back pain    Duodenal adenoma    Gout    Hypertension    Stroke (HCC) 06/2020   Syncope and collapse     Social History:  Social History   Socioeconomic History   Marital status: Single    Spouse name: Not on file   Number of children: 1   Years of education: 12   Highest education level: 12th grade  Occupational History   Not on file  Tobacco Use   Smoking status: Every Day    Current packs/day: 0.50    Average  packs/day: 0.5 packs/day for 20.0 years (10.0 ttl pk-yrs)    Types: Cigarettes    Passive exposure: Current   Smokeless tobacco: Never   Tobacco comments:    Verified by Daughter - Emilie Harden, 2 cigarettes per day 10-23-22  Vaping Use   Vaping status: Never Used  Substance and Sexual Activity   Alcohol use: Not Currently    Comment: occasional   Drug use: No  Sexual activity: Not Currently  Other Topics Concern   Not on file  Social History Narrative   Lives with daughter, son in law and 2 grandchildren   Right Handed   Drinks 1 cup caffeine 4 times a week-ish   Social Drivers of Health   Financial Resource Strain: Low Risk  (03/31/2022)   Overall Financial Resource Strain (CARDIA)    Difficulty of Paying Living Expenses: Not hard at all  Food Insecurity: No Food Insecurity (01/01/2023)   Hunger Vital Sign    Worried About Running Out of Food in the Last Year: Never true    Ran Out of Food in the Last Year: Never true  Transportation Needs: No Transportation Needs (01/01/2023)   PRAPARE - Administrator, Civil Service (Medical): No    Lack of Transportation (Non-Medical): No  Physical Activity: Insufficiently Active (10/21/2022)   Exercise Vital Sign    Days of Exercise per Week: 1 day    Minutes of Exercise per Session: 10 min  Stress: Stress Concern Present (10/21/2022)   [employer] of Occupational Health - Occupational Stress Questionnaire    Feeling of Stress : To some extent  Social Connections: Socially Isolated (03/31/2022)   Social Connection and Isolation Panel [NHANES]    Frequency of Communication with Friends and Family: More than three times a week    Frequency of Social Gatherings with Friends and Family: More than three times a week    Attends Religious Services: Never    Database administrator or Organizations: No    Attends Banker Meetings: Never    Marital Status: Divorced  Catering manager Violence: Patient Unable To  Answer (01/01/2023)   Humiliation, Afraid, Rape, and Kick questionnaire    Fear of Current or Ex-Partner: Patient unable to answer    Emotionally Abused: Patient unable to answer    Physically Abused: Patient unable to answer    Sexually Abused: Patient unable to answer    Medications:   Current Outpatient Medications on File Prior to Visit  Medication Sig Dispense Refill   allopurinol  (ZYLOPRIM ) 100 MG tablet Take 1 tablet (100 mg total) by mouth daily. TAKE 1 TABLET(100 MG) BY MOUTH DAILY 90 tablet 3   aspirin  EC 81 MG tablet Take 1 tablet (81 mg total) by mouth daily. Resume on 6/4 90 tablet 3   atorvastatin  (LIPITOR) 40 MG tablet TAKE 1 TABLET BY MOUTH ONCE DAILY 30 tablet 5   citalopram  (CELEXA ) 10 MG tablet Take 10 mg by mouth daily.     colchicine  0.6 MG tablet Take 0.6 mg by mouth daily.     ferrous sulfate  300 (60 Fe) MG/5ML syrup Take 5 mLs (300 mg total) by mouth daily with breakfast. 150 mL 3   furosemide  (LASIX ) 20 MG tablet Take 1 tablet (20 mg total) by mouth every morning. (Patient taking differently: Take 20 mg by mouth every other day.) 60 tablet 0   levETIRAcetam  (KEPPRA ) 500 MG tablet Take 1 tablet (500 mg total) by mouth 2 (two) times daily. 180 tablet 3   Melatonin-Pyridoxine (MELATIN PO) Take by mouth.     Multiple Vitamin (MULTI VITAMIN) TABS 1 tablet Orally Once a day     Multiple Vitamin (MULTIVITAMIN WITH MINERALS) TABS tablet Take 1 tablet by mouth daily. (Patient taking differently: Take 1 tablet by mouth daily at 12 noon.)     potassium chloride  (KLOR-CON ) 20 MEQ packet Take 40 mEq by mouth daily for 14 days. 28 each  0   QUEtiapine  (SEROQUEL ) 50 MG tablet Take 50 mg by mouth at bedtime.     sucralfate  (CARAFATE ) 1 g tablet Take 1 tablet (1 g total) by mouth 2 (two) times daily. 60 tablet 0   tamsulosin  (FLOMAX ) 0.4 MG CAPS capsule Take 1 capsule (0.4 mg total) by mouth daily after supper. 60 capsule 8   traMADol  (ULTRAM ) 50 MG tablet Take 1 tablet (50 mg total)  by mouth 2 (two) times daily as needed. 60 tablet 5   No current facility-administered medications on file prior to visit.    Allergies:  No Known Allergies  Physical Exam There were no vitals filed for this visit.  There is no height or weight on file to calculate BMI.   General: Frail very pleasant elderly African-American male, seated, in no evident distress  Neurologic Exam Mental Status: Awake and fully alert.  Unable to appreciate dysarthria or aphasia although limited conversation, daughter provides majority of history.  Able to follow commands without difficulty. Cranial Nerves: Pupils equal, briskly reactive to light. Extraocular movements full without nystagmus. Visual fields full to confrontation. Hearing intact. Facial sensation intact.  Mild left lower facial weakness.  Tongue, palate moves normally and symmetrically.  Motor: Normal bulk and tone. RUE 4+/5, LUE 5/5 proximal, weak grip strength and difficulty making fist.  Decreased hand dexterity R>L.  R HF weakness, right ankle dorsiflexion and plantarflexion weakness (chronic), very slight LLE weakness Sensory.: intact to touch , pinprick , position and vibratory sensation.  Coordination: Rapid alternating movements impaired bilaterally.   Gait and Station: Deferred Reflexes: 1+ and symmetric. Toes downgoing.        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  Vascular risk factors include left ICA stenosis, HTN, HLD, CAD, CHF and PAD. 12/2022 hospitalization with incidental finding of 5 mm ACA aneurysm    Ischemic strokes as above:  Residual deficit: Right > left hemiparesis, gait  impairement and cognitive impairment. Declined HH therapy. Discussed increasing physical activity as tolerated and as safety permits and to call office if interested in doing therapy in the future Continue aspirin  81 mg daily and atorvastatin  for secondary stroke prevention managed by PCP/cardiology Close PCP f/u for aggressive stroke risk factor management including BP goal<130/90, and HLD with LDL goal<70  Vascular dementia:  Subjectively stable since prior visit Repeat MMSE at f/u visit Could consider use of Namenda in the future if needed Continue Seroquel  at night for mood and sleep managed by PCP Discussed importance of routine memory exercises at home as well as routine physical exercise (as tolerated), healthy diet, adequate sleep and management of risk factors.  Daughter continues to provide 24/7 supervision and care  Seizures:  Stable without new seizure activity.   Continue Keppra  500 mg twice daily EEG 04/2022 no evidence of seizure activity (completed after discontinuing Dilantin )  Left ICA stenosis, asymptomatic L ICA 80% stenosis per CTA 12/2022, prior CTA 70% 09/2020 Order placed for repeat CTA on 11/4, patient previously refusing, discussed indication for completing imaging and patient agreed to pursue, daughter will call [place] imaging to schedule Continue medical therapy for now, if progresses will refer to [facility] for ongoing monitoring  Cerebral aneurysm, unruptured Noted 5 mm biloped ACA  aneurysm on CTA head 12/2022 (not mentioned on prior CTA in 2022) Previously followed by IR Dr. Alvira Josephs but patient would not be interested in any intervention if there became a need  Repeat CTA ordered for surveillance monitoring (see above)    Follow-up in 6 months or call earlier if needed   CC:  Scism, Acey Ace, FNP   I spent 30 minutes of face-to-face and non-face-to-face time with patient and daughter.  This included previsit chart review including review of recent  hospitalization, lab review, study review, order entry, electronic health record documentation, patient and daughter education and discussion regarding above diagnoses and treatment plan and answered all the questions to patient and daughter satisfaction  Johny Nap, AGNP-BC  [facility] [address] [place], [place] [address]  Phone [phone] Fax [phone] Note: This document was prepared with digital dictation and possible smart phrase technology. Any transcriptional errors that result from this process are unintentional.

## 2023-12-09 ENCOUNTER — Ambulatory Visit: Admitting: Adult Health

## 2023-12-09 ENCOUNTER — Telehealth: Payer: Self-pay | Admitting: Adult Health

## 2023-12-09 NOTE — Telephone Encounter (Signed)
 Pt's daughter, Emilie Harden cancelled appt due to  has to work, unable to bring patient to appt

## 2023-12-11 DIAGNOSIS — K219 Gastro-esophageal reflux disease without esophagitis: Secondary | ICD-10-CM | POA: Diagnosis not present

## 2023-12-11 DIAGNOSIS — Z7401 Bed confinement status: Secondary | ICD-10-CM | POA: Diagnosis not present

## 2023-12-18 ENCOUNTER — Ambulatory Visit: Admitting: Family

## 2023-12-30 ENCOUNTER — Ambulatory Visit: Payer: Medicare Other | Admitting: Adult Health

## 2024-01-05 DIAGNOSIS — E785 Hyperlipidemia, unspecified: Secondary | ICD-10-CM | POA: Diagnosis not present

## 2024-01-05 DIAGNOSIS — I10 Essential (primary) hypertension: Secondary | ICD-10-CM | POA: Diagnosis not present

## 2024-01-05 DIAGNOSIS — R54 Age-related physical debility: Secondary | ICD-10-CM | POA: Diagnosis not present

## 2024-02-02 DIAGNOSIS — K2950 Unspecified chronic gastritis without bleeding: Secondary | ICD-10-CM | POA: Diagnosis not present

## 2024-02-02 DIAGNOSIS — R54 Age-related physical debility: Secondary | ICD-10-CM | POA: Diagnosis not present

## 2024-02-29 DIAGNOSIS — I10 Essential (primary) hypertension: Secondary | ICD-10-CM | POA: Diagnosis not present

## 2024-02-29 DIAGNOSIS — R54 Age-related physical debility: Secondary | ICD-10-CM | POA: Diagnosis not present

## 2024-02-29 DIAGNOSIS — D508 Other iron deficiency anemias: Secondary | ICD-10-CM | POA: Diagnosis not present

## 2024-03-08 DIAGNOSIS — I5042 Chronic combined systolic (congestive) and diastolic (congestive) heart failure: Secondary | ICD-10-CM | POA: Diagnosis not present

## 2024-04-01 DIAGNOSIS — R54 Age-related physical debility: Secondary | ICD-10-CM | POA: Diagnosis not present

## 2024-04-01 DIAGNOSIS — D508 Other iron deficiency anemias: Secondary | ICD-10-CM | POA: Diagnosis not present

## 2024-04-01 DIAGNOSIS — G894 Chronic pain syndrome: Secondary | ICD-10-CM | POA: Diagnosis not present

## 2024-04-01 DIAGNOSIS — I10 Essential (primary) hypertension: Secondary | ICD-10-CM | POA: Diagnosis not present

## 2024-04-08 DIAGNOSIS — I5042 Chronic combined systolic (congestive) and diastolic (congestive) heart failure: Secondary | ICD-10-CM | POA: Diagnosis not present

## 2024-04-14 ENCOUNTER — Ambulatory Visit: Admitting: Cardiovascular Disease

## 2024-04-27 NOTE — Progress Notes (Unsigned)
 [facility] [address] [place]. [place] [address] [phone]       OFFICE FOLLOW UP VISIT NOTE  Andres White Date of Birth:  Jun 01, 1946 Medical Record Number:  [id number]   Referring MD: Sharlet Schmitz, PA-C Reason for Referral: Stroke, seizures and dementia    No chief complaint on file.    HPI:   Update 04/28/2024 JM: Patient returns for overdue follow-up visit accompanied by his daughter who provides majority of history.  Overall has been stable and denies new stroke/TIA symptoms.  Continue L>R sided weakness, denies any worsening.  Primarily transfers via wheelchair, ***.    Routinely follows with PCP for stroke risk factor management.  Reports compliance on aspirin  and atorvastatin .  Order previously placed for repeat CTA head/neck for monitoring of carotid stenosis and cerebral aneurysm, discussed importance of pursuing at prior visit in which he eventually agreed and daughter plan on contacting [facility] imaging to schedule but still not completed.  Denies any known seizure activity.  Compliant on Keppra  without side effects.        History provided for reference purposes only Update 06/25/2023 JM: Patient returns for follow-up visit accompanied by his daughter and granddaughter.  Daughter provides majority of history.  Reports overall he has been doing well. Denies new stroke/TIA symptoms. Some improvement noted on left side, continued right sided weakness. Is able to ambulate short distance with assistance and RW but frequently refuses to ambulate, transfers primarily via wheelchair.  Never worked with home health therapy as he declined.  Reports cognition has been stable but can have good days and bad days.  Has very supportive family who assists with ADLs.  Denies any seizure activity, compliant on Keppra  without side effects.  Compliant on aspirin  and atorvastatin .  Routinely follows with PCP for stroke risk factor management.  Recently  ordered repeat CTA head/neck for surveillance monitoring of cerebral aneurysm and left carotid stenosis but patient has been refusing to have testing completed.  No questions or concerns at this time.  Update 03/11/2023 JM: Patient returns for follow-up visit accompanied by his daughter and grandchildren.  Previously seen by Dr. Rosemarie in January for significant decline in functioning following a cold.  Urinalysis suggestive of UTI and blood work suggestive of worsening kidney function, advised to follow-up with PCP for further treatment recommendations.  He was seen by his PCP on 1/26 who advised further ER evaluation.  He was admitted for 6 days with evidence of sepsis likely from UTI, AKI (resolved at time of discharge), and hypokalemia (resolved at discharge).  Gabapentin  and citalopram  discontinued.  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  CTA showed progression of left ICA at 80% as well as 5 mm anterior communicating artery aneurysm.  EEG no seizures or epileptiform activity, continued on Keppra  500 mg twice daily.  Also noted GI bleed with severe anemia, held aspirin  and recommended consideration of aspirin  or Plavix monotherapy once anemia improves and cleared from GI.  Patient also treated for UTI and COVID infection by primary team.  Goals of care discussion in setting of multiple comorbidities, advanced age and overall poor prognosis - family opted to keep patient full code and continue full scope of treatment.  Therapies recommended SNF although daughter opted to bring patient home as he  was at baseline functioning.    Daughter provides majority of history.  He has been doing well since discharge.  No new stroke/TIA symptoms.  Continued right-sided weakness  which has been stable. Daughter has noted some mild left-sided weakness and balance difficulties since recent stroke. Also notes slight decline in cognition. Able to take a few steps with daughters assistance and use of RW, use of w/c for long distance. Completed HH therapies just prior to above hospitalization, has not had any therapy since discharge.  Continues on Seroquel  which helps with mood and sleep.  No significant behavioral concerns.    Has since restarted aspirin  without any bleeding concerns, also continues on atorvastatin .  Blood pressure today elevated, asymptomatic, routinely monitors at home and typically 120s/80s.  Routinely followed by PCP who does home visits.  Denies any seizure activity, remains on Keppra  500 mg twice daily   Update 08/26/2022 Dr. Rosemarie: Patient is seen today urgently upon request from his daughter because significant general decline in his condition following a code 3 weeks ago.  Patient's current symptoms seem to have subsided but he is become weak all over with inability to get up and stand or walk.  He is also been mentally quite slow and increased.  He can be aroused but no energy to do anything.  His baseline right-sided weakness also appears more pronounced.  He can hardly stay awake.SABRA  He can barely open his eyes and then falls asleep.  He remains on gabapentin  300 mg 3 times daily as well as Eliquis 50 mg at night.  Patient has also not been eating well and may have lost some weight.  Has an upcoming appointment to see his primary care physician coming Friday.  He has not had any recent lab work or brain imaging study done.  Noticed hemiplegia, slurred speech diplopia or other strokelike symptoms.   Update 08/05/2022 JM: Patient returns for 22-month follow-up.  Daughter reports episode of slurred speech and left hand numbness about 1 month upon awakening that lasted about 30 seconds then completely resolved. Daughter unsure if symptoms from just waking up or  sleeping on hand wrong. Patient declined going to ED at that time. Per daughter, he was able to answer questions and follow commands. Did not lose consciousness.  Denies any weakness, visual changes or cognitive changes. No reoccuring or new stroke/TIA symptoms since that time.    Residual right-sided weakness and cognitive impairment.  Use of RW at all times, no falls. Daughter reports some mild gradual decline in memory since last visit. MMSE today 13/30 (prior 11/30). Daughter continues to provide 24/7 supervision and assistance for ADLs and IADLs.  Remains on aspirin  and atorvastatin .  Blood pressure today 158/93.  Routinely follows with PCP. Has f/u next week, plans on repeat lab work.   Denies any seizure activity, remains on Keppra  500 mg BID. Has since stopped Dilantin .  No further concerns at this time  Update 01/20/2022 JM: Patient returns for stroke, seizure and dementia follow-up after prior visit almost 1 year ago (requested 67-month follow-up).  He is accompanied by his daughter who provides majority of history.  Stable from stroke standpoint, denies new stroke/TIA symptoms.  Residual right-sided weakness and cognitive impairment stable. Will use RW at all times, no recent falls.  Compliant on aspirin  and atorvastatin , denies side effects.  Blood pressure today 150/82.   Cognition has been stable since prior visit.  MMSE today 11/30 (prior 11/30).  Continues to live with his daughter who  provides 24/7 supervision and assistance for majority of ADLs.  Recent lab work with PCP showed low B12 at 176 and was started on oral B12 supplement.  Denies any seizure activity.  Discussed weaning off of Dilantin  at prior visit but he has since remained on both Keppra  and Dilantin  due to miscommunication.  Denies side effects on medications.  He has since establish care with new PCP, he was referred to urologist due to elevated PSA.  Daughter also concerned regarding frequent urination, questions if  this is due to furosemide .  This is currently being managed by cardiology which was started back in 10/2020 for edema.  Daughter was encouraged to ensure evaluation with urology scheduled for further discussion as well as with cardiology for furosemide  management  No further concerns at this time  Update 02/25/2021 JM: Andres White returns for 49-month stroke and seizure follow-up accompanied by his daughter who provides history.  He has been stable since prior visit without new stroke/TIA symptoms or seizure activity.  Reports residual right sided weakness and cognitive impairment- was working with HH therapies but stopped mid Spring due to short staffing.  He does try to stay active at home but not necessarily any type of routine physical or mental exercises.  He ambulates with rolling walker - did have 1 fall when he was not using walker thankfully without injury.  He continues to live with his daughter who provides 24/7 supervision as well as assistance for ADLs.  Cognition has been generally stable but fluctuates per daughter.  On gabapentin  300 mg TID per PMR Dr. Lorilee for neuropathy pain.  Compliant on aspirin  and atorvastatin  without associated side effects.  Blood pressure today 151/87.  Compliant on Keppra  and Dilantin  tolerating without side effects.  No further concerns at this time.  Update 11/08/2020 Dr. Rosemarie: He returns for follow-up after last visit 3 months ago.  Patient is referred back to see me because of new problems of seizures which required admission to [facility] on 09/16/2020.  Patient will at home when family witnessed that he was not acting his usual self and was gazing upwards.  When EMS arrived they noticed a left gaze deviation and unresponsiveness to voice and following commands and possibly some left-sided weakness.  Patient was initially brought in as a code stroke but while being evaluated was noticed is likely having a seizure because he had tonic gaze deviation with a  cry and stiffening of the whole body followed by generalized shaking which lasted about a minute.  He was given IV Ativan  which stopped the episode.  He was also loaded with IV Keppra .  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  EEG done on 09/17/2020 and 09/18/2020 showed moderate diffuse encephalopathy there were several episodes of even better being pushed by the staff when they noticed tremulous movements on the right but these did not have any electrographic correlates for seizures.  Patient was intubated and IV phenytoin  was added to the Keppra  with the got better and self extubated himself.  He finished long-term EEG monitoring which showed no definite seizure activity.  MRI scan of the brain on 09/20/2020 was motion degraded but showed no acute abnormality.  Showed evidence of old infarcts bilaterally.  Patient did have some cognitive impairment following his previous stroke when the daughter feels that this has gotten worse now.  He requires constant supervision.  He cannot be left alone.  He is able to walk with a walker but he is disoriented at baseline and requires help with most activities of daily living.  He has had no recurrent seizures of focal jerking since his being home.  He is still on the current dosages of Keppra  finder milligram twice daily and phenytoin  100 mg   3 times a day.  Is had no recurrent stroke or TIA symptoms and remains on aspirin  she is tolerating well.  Blood pressure is well controlled today it is slightly elevated 160/82.  Is tolerating Lipitor well without muscle aches and pains.  Initial visit 08/30/2020 Dr. Rosemarie: Mr. Avel is a 78 year old African-American male seen today for initial office consultation visit for stroke.  Is accompanied by his daughter who provides history.  I also reviewed electronic medical records and  imaging films in PACS.  He has a past medical history of hypertension, tobacco abuse, arthritis who presented to [facility] on 06/25/2020 with right leg weakness for 4 days prior to admission.  He was found to have mild right hand and more leg weakness.  MRI scan of the brain showed a left midbrain lacunar infarct.  Carotid ultrasound suggested moderate 70 to 90% left ICA stenosis which is asymptomatic.  Echocardiogram showed normal ejection fraction without cardiac source of embolism.  LDL cholesterol was 32 mg percent.  Hemoglobin A1c was 4.8.  Lower extremity venous Dopplers were negative.  Patient subsequently had 2 weeks external cardiac monitor done which was negative for paroxysmal A. fib.  Patient was started on aspirin  81 mg daily which he is tolerating well without bruising or bleeding.  He went to inpatient rehab for 4 weeks and subsequently is at home living with her daughter.  Is able to ambulate independently now though he still has some diminished fine motor skills and mild right leg dragging.  His speech is improved.  He however has significant memory loss and cognitive impairment following his stroke as per the daughter.  He is quite forgetful and cannot remember recent conversations and needs constant reminders.  He was previously living independently and managing his own affairs but daughter feels he is no longer able to do so.  Patient states he is cut back smoking significantly but still smokes a few cigarettes a day.  His blood pressure is well controlled and today it is 142/86.  There is no family history of dementia.  Patient has no prior history of seizures or significant neurological problems.   ROS:   14 system review of systems is positive for those listed in HPI and all other systems negative.     PMH:  Past Medical History:  Diagnosis Date   Arthritis    Chronic back pain    Duodenal adenoma    Gout    Hypertension    Stroke (HCC) 06/2020   Syncope  and collapse     Social History:  Social History   Socioeconomic History   Marital status: Single    Spouse name: Not on file   Number of children: 1   Years of education: 12   Highest education level: 12th grade  Occupational History   Not on file  Tobacco Use   Smoking status: Every Day    Current packs/day: 0.50    Average packs/day: 0.5 packs/day for 20.0 years (10.0 ttl pk-yrs)    Types: Cigarettes    Passive exposure: Current   Smokeless tobacco:  Never   Tobacco comments:    Verified by Daughter - [name], 2 cigarettes per day 10-23-22  Vaping Use   Vaping status: Never Used  Substance and Sexual Activity   Alcohol use: Not Currently    Comment: occasional   Drug use: No   Sexual activity: Not Currently  Other Topics Concern   Not on file  Social History Narrative   Lives with daughter, son in law and 2 grandchildren   Right Handed   Drinks 1 cup caffeine 4 times a week-ish   Social Drivers of Health   Financial Resource Strain: Low Risk  (03/31/2022)   Overall Financial Resource Strain (CARDIA)    Difficulty of Paying Living Expenses: Not hard at all  Food Insecurity: No Food Insecurity (01/01/2023)   Hunger Vital Sign    Worried About Running Out of Food in the Last Year: Never true    Ran Out of Food in the Last Year: Never true  Transportation Needs: No Transportation Needs (01/01/2023)   PRAPARE - Administrator, Civil Service (Medical): No    Lack of Transportation (Non-Medical): No  Physical Activity: Insufficiently Active (10/21/2022)   Exercise Vital Sign    Days of Exercise per Week: 1 day    Minutes of Exercise per Session: 10 min  Stress: Stress Concern Present (10/21/2022)   [employer] of Occupational Health - Occupational Stress Questionnaire    Feeling of Stress : To some extent  Social Connections: Socially Isolated (03/31/2022)   Social Connection and Isolation Panel    Frequency of Communication with Friends and  Family: More than three times a week    Frequency of Social Gatherings with Friends and Family: More than three times a week    Attends Religious Services: Never    Database administrator or Organizations: No    Attends Banker Meetings: Never    Marital Status: Divorced  Catering manager Violence: Patient Unable To Answer (01/01/2023)   Humiliation, Afraid, Rape, and Kick questionnaire    Fear of Current or Ex-Partner: Patient unable to answer    Emotionally Abused: Patient unable to answer    Physically Abused: Patient unable to answer    Sexually Abused: Patient unable to answer    Medications:   Current Outpatient Medications on File Prior to Visit  Medication Sig Dispense Refill   allopurinol  (ZYLOPRIM ) 100 MG tablet Take 1 tablet (100 mg total) by mouth daily. TAKE 1 TABLET(100 MG) BY MOUTH DAILY 90 tablet 3   aspirin  EC 81 MG tablet Take 1 tablet (81 mg total) by mouth daily. Resume on 6/4 90 tablet 3   atorvastatin  (LIPITOR) 40 MG tablet TAKE 1 TABLET BY MOUTH ONCE DAILY 30 tablet 5   citalopram  (CELEXA ) 10 MG tablet Take 10 mg by mouth daily.     colchicine  0.6 MG tablet Take 0.6 mg by mouth daily.     ferrous sulfate  300 (60 Fe) MG/5ML syrup Take 5 mLs (300 mg total) by mouth daily with breakfast. 150 mL 3   furosemide  (LASIX ) 20 MG tablet Take 1 tablet (20 mg total) by mouth every morning. (Patient taking differently: Take 20 mg by mouth every other day.) 60 tablet 0   levETIRAcetam  (KEPPRA ) 500 MG tablet Take 1 tablet (500 mg total) by mouth 2 (two) times daily. 180 tablet 3   Melatonin-Pyridoxine (MELATIN PO) Take by mouth.     Multiple Vitamin (MULTI VITAMIN) TABS 1 tablet Orally Once a day  Multiple Vitamin (MULTIVITAMIN WITH MINERALS) TABS tablet Take 1 tablet by mouth daily. (Patient taking differently: Take 1 tablet by mouth daily at 12 noon.)     potassium chloride  (KLOR-CON ) 20 MEQ packet Take 40 mEq by mouth daily for 14 days. 28 each 0   QUEtiapine   (SEROQUEL ) 50 MG tablet Take 50 mg by mouth at bedtime.     sucralfate  (CARAFATE ) 1 g tablet Take 1 tablet (1 g total) by mouth 2 (two) times daily. 60 tablet 0   tamsulosin  (FLOMAX ) 0.4 MG CAPS capsule Take 1 capsule (0.4 mg total) by mouth daily after supper. 60 capsule 8   traMADol  (ULTRAM ) 50 MG tablet Take 1 tablet (50 mg total) by mouth 2 (two) times daily as needed. 60 tablet 5   No current facility-administered medications on file prior to visit.    Allergies:  No Known Allergies  Physical Exam There were no vitals filed for this visit.  There is no height or weight on file to calculate BMI.   General: Frail very pleasant elderly African-American male, seated, in no evident distress  Neurologic Exam Mental Status: Awake and fully alert.  Unable to appreciate dysarthria or aphasia although limited conversation, daughter provides majority of history.  Able to follow commands without difficulty. Cranial Nerves: Pupils equal, briskly reactive to light. Extraocular movements full without nystagmus. Visual fields full to confrontation. Hearing intact. Facial sensation intact.  Mild left lower facial weakness.  Tongue, palate moves normally and symmetrically.  Motor: Normal bulk and tone. RUE 4+/5, LUE 5/5 proximal, weak grip strength and difficulty making fist.  Decreased hand dexterity R>L.  R HF weakness, right ankle dorsiflexion and plantarflexion weakness (chronic), very slight LLE weakness Sensory.: intact to touch , pinprick , position and vibratory sensation.  Coordination: Rapid alternating movements impaired bilaterally.   Gait and Station: Deferred Reflexes: 1+ and symmetric. Toes downgoing.        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  Vascular risk factors include left ICA stenosis, HTN, HLD, CAD, CHF and PAD. 12/2022 hospitalization with incidental finding of 5 mm ACA aneurysm    Ischemic strokes as above:  Residual deficit: Right > left hemiparesis, gait impairement and cognitive impairment. Declined HH therapy. Discussed increasing physical activity as tolerated and as safety permits and to call office if interested in doing therapy in the future Continue aspirin  81 mg daily and atorvastatin  for secondary stroke prevention managed by PCP/cardiology Close PCP f/u for aggressive stroke risk factor management including BP goal<130/90, and HLD with LDL goal<70  Vascular dementia:  Subjectively stable since prior visit MMSE today ***/30 Could consider use of Namenda in the future if needed Continue Seroquel  at night for mood and sleep managed by PCP Discussed importance of routine memory exercises at home as well as routine physical exercise (as tolerated), healthy diet, adequate sleep and management of risk factors.  Daughter continues to provide 24/7 supervision and care  Seizures:  Stable without new seizure activity.   Continue Keppra  500 mg twice daily EEG 04/2022 no evidence of seizure activity (completed after discontinuing Dilantin )  Left ICA stenosis, asymptomatic L ICA 80% stenosis per CTA 12/2022, prior CTA 70% 09/2020 Order placed for repeat CTA on  11/4, patient previously refusing, discussed indication for completing imaging and patient agreed to pursue, daughter will call [place] [facility] imaging to schedule Continue medical therapy for now, if progresses will refer to [facility] for ongoing monitoring  Cerebral aneurysm, unruptured Noted 5 mm biloped ACA aneurysm on CTA head 12/2022 (not mentioned on prior CTA in 2022) Previously followed by IR Dr. Dolphus but patient would not be interested in any intervention if  there became a need  Repeat CTA ordered for surveillance monitoring (see above)    Follow-up in 6 months or call earlier if needed   CC:  Scism, Almarie NOVAK, FNP   I personally spent a total of *** minutes in the care of the patient today including {Time Based Coding:210964241}.   Harlene Bogaert, AGNP-BC  [facility] [address] [place], [place] [address]  Phone [phone] Fax [phone] Note: This document was prepared with digital dictation and possible smart phrase technology. Any transcriptional errors that result from this process are unintentional.

## 2024-04-28 ENCOUNTER — Ambulatory Visit (INDEPENDENT_AMBULATORY_CARE_PROVIDER_SITE_OTHER): Admitting: Adult Health

## 2024-04-28 ENCOUNTER — Encounter: Payer: Self-pay | Admitting: Adult Health

## 2024-04-28 VITALS — BP 113/77 | HR 70 | Ht 67.0 in

## 2024-04-28 DIAGNOSIS — F0150 Vascular dementia without behavioral disturbance: Secondary | ICD-10-CM

## 2024-04-28 DIAGNOSIS — R569 Unspecified convulsions: Secondary | ICD-10-CM

## 2024-04-28 DIAGNOSIS — Z7409 Other reduced mobility: Secondary | ICD-10-CM

## 2024-04-28 DIAGNOSIS — I671 Cerebral aneurysm, nonruptured: Secondary | ICD-10-CM | POA: Diagnosis not present

## 2024-04-28 DIAGNOSIS — I69398 Other sequelae of cerebral infarction: Secondary | ICD-10-CM | POA: Diagnosis not present

## 2024-04-28 DIAGNOSIS — I639 Cerebral infarction, unspecified: Secondary | ICD-10-CM | POA: Diagnosis not present

## 2024-04-28 DIAGNOSIS — I6522 Occlusion and stenosis of left carotid artery: Secondary | ICD-10-CM | POA: Diagnosis not present

## 2024-04-28 MED ORDER — LEVETIRACETAM 500 MG PO TABS: 500.0000 mg | ORAL_TABLET | Freq: Two times a day (BID) | ORAL | 3 refills | Status: AC

## 2024-04-28 MED ORDER — DONEPEZIL HCL 5 MG PO TABS: 5.0000 mg | ORAL_TABLET | Freq: Every day | ORAL | 0 refills | Status: AC

## 2024-04-28 NOTE — Patient Instructions (Addendum)
 Order placed to start home health therapy - you will be called to get this set up  Continue Keppra  500 mg twice daily for seizure prevention -please call with any recurrent seizure activity  Start Aricept  5mg  nightly to help slow cognitive decline - please call after 2-3 weeks for dosage increase  It is highly encouraged to proceed with repeat imaging (CTA head/neck or carotid ultrasound) for monitoring of left carotid stenosis as prior imaging showed worsening compared to imaging in 2022.  If this continues to progress, you are at a high risk for recurrent stroke - please call if you are interested in pursuing in the future  Continue aspirin  81 mg daily  and atorvastatin  for secondary stroke prevention  Continue to follow up with PCP regarding blood pressure and cholesterol management  Maintain strict control of hypertension with blood pressure goal below 130/90 and cholesterol with LDL cholesterol (bad cholesterol) goal below 70 mg/dL.   Signs of a Stroke? Follow the BEFAST method:  Balance Watch for a sudden loss of balance, trouble with coordination or vertigo Eyes Is there a sudden loss of vision in one or both eyes? Or double vision?  Face: Ask the person to smile. Does one side of the face droop or is it numb?  Arms: Ask the person to raise both arms. Does one arm drift downward? Is there weakness or numbness of a leg? Speech: Ask the person to repeat a simple phrase. Does the speech sound slurred/strange? Is the person confused ? Time: If you observe any of these signs, call 911.      Followup in the future with me in 6 months or call earlier if needed       Thank you for coming to see us  at [facility]. I hope we have been able to provide you high quality care today.  You may receive a patient satisfaction survey over the next few weeks. We would appreciate your feedback and comments so that we may continue to improve ourselves and the health of our  patients.

## 2024-05-02 ENCOUNTER — Telehealth: Payer: Self-pay | Admitting: Adult Health

## 2024-05-02 NOTE — Telephone Encounter (Signed)
 [facility] is going to take this patient.

## 2024-05-03 DIAGNOSIS — R54 Age-related physical debility: Secondary | ICD-10-CM | POA: Diagnosis not present

## 2024-05-03 DIAGNOSIS — E785 Hyperlipidemia, unspecified: Secondary | ICD-10-CM | POA: Diagnosis not present

## 2024-05-03 DIAGNOSIS — D508 Other iron deficiency anemias: Secondary | ICD-10-CM | POA: Diagnosis not present

## 2024-05-03 DIAGNOSIS — I110 Hypertensive heart disease with heart failure: Secondary | ICD-10-CM | POA: Diagnosis not present

## 2024-05-03 DIAGNOSIS — G894 Chronic pain syndrome: Secondary | ICD-10-CM | POA: Diagnosis not present

## 2024-05-05 NOTE — Progress Notes (Deleted)
 Cardiology Office Note:    Date:  05/05/2024   ID:  Andres White, DOB April 23, 1946, MRN [id number]  PCP:  Andres Almarie NOVAK, FNP   [facility] Providers Cardiologist:  Andres White Decent, MD { Click to update primary MD,subspecialty MD or APP then REFRESH:1}    Referring MD: Andres Almarie NOVAK, FNP   No chief complaint on file. ***  History of Present Illness:    Andres White is a 79 y.o. 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   Heart monitor 2022 with 158 episodes of wide-complex tachycardia (favor VT over SVT). Pt not started on BB due to hx of bradycardia. Nuclear stress test 2022 was now risk without ischemia or infarction. Moderate PAD on RLE by ABIs, follows with [facility]. Admitted 09/2022 with sepsis secondary to UTI, worsening right sided weakness concerning for CVA. Repeat heart monitor showed NSVT and PVCs (6% burden). Admission 12/2022 with GI bleed and increase in ectopy related to electrolyte disturbance, ectopy subsided when repleted. GI bleed secondary to AVMs that were ablated with GI. During that admission, SNF and palliative care recommended. Pt was discharged home with family and remains full code.   He presents for routine cardiology follow up. He his here with his Andres. He has been in a wheelchair    PVCs, NSVT Bradycardia - no BB - heart monitor 09/2022 showed NSVT and 6% PVCs, 4% PACs - Echocardiogram 12/2022 showed preserved LVEF 60 to 65%, no RWMA, moderate asymmetric LVH, normal RV function - Nuclear stress test January 2022 was reassuring, no ischemia -She denies palpitations - Magnesium  1.7 when last checked in 05/2023 -BMP was not completed -- ?on K supplement? -- need BMP recheck   Chronic diastolic heart failure - Reassuring echocardiogram in 2024   History of CVA - With  residual right sided weakness -- on ASA and statin   History of GI bleed - PCP follows       Past Medical History:  Diagnosis Date   Arthritis    Chronic back pain    Duodenal adenoma    Gout    Hypertension    Stroke (HCC) 06/2020   Syncope and collapse     Past Surgical History:  Procedure Laterality Date   BIOPSY  07/13/2020   Procedure: BIOPSY;  Surgeon: Andres Gwendlyn ONEIDA, MD;  Location: [facility] ENDOSCOPY;  Service: Endoscopy;;   BIOPSY  11/28/2020   Procedure: BIOPSY;  Surgeon: Andres Andres White., MD;  Location: [facility] ENDOSCOPY;  Service: Gastroenterology;;   CARDIAC CATHETERIZATION  2015   [facility]    COLONOSCOPY WITH PROPOFOL  N/A 07/13/2020   Procedure: COLONOSCOPY WITH PROPOFOL ;  Surgeon: Andres Gwendlyn ONEIDA, MD;  Location: [facility] ENDOSCOPY;  Service: Endoscopy;  Laterality: N/A;   COLONOSCOPY WITH PROPOFOL  N/A 11/28/2020   Procedure: COLONOSCOPY WITH PROPOFOL ;  Surgeon: Andres White, Andres White., MD;  Location: [facility] ENDOSCOPY;  Service: Gastroenterology;  Laterality: N/A;   ENDOSCOPIC MUCOSAL RESECTION N/A 11/28/2020   Procedure: ENDOSCOPIC MUCOSAL RESECTION;  Surgeon: Andres Andres White., MD;  Location: [facility] ENDOSCOPY;  Service: Gastroenterology;  Laterality: N/A;   ENTEROSCOPY N/A 01/03/2023   Procedure: ENTEROSCOPY;  Surgeon: Andres Andres White., MD;  Location[facility] ENDOSCOPY;  Service: Gastroenterology;  Laterality: N/A;   ESOPHAGOGASTRODUODENOSCOPY N/A 07/13/2020   Procedure: ESOPHAGOGASTRODUODENOSCOPY (EGD);  Surgeon: Andres Gwendlyn ONEIDA, MD;  Location: [facility]  ENDOSCOPY;  Service: Endoscopy;  Laterality: N/A;   ESOPHAGOGASTRODUODENOSCOPY (EGD) WITH PROPOFOL  N/A 11/28/2020   Procedure: ESOPHAGOGASTRODUODENOSCOPY (EGD) WITH PROPOFOL ;  Surgeon: Andres Andres White., MD;  Location: [facility] ENDOSCOPY;  Service: Gastroenterology;  Laterality: N/A;   HEMOSTASIS CLIP PLACEMENT  11/28/2020   Procedure: HEMOSTASIS CLIP PLACEMENT;  Surgeon: Andres Andres White., MD;  Location: [facility] ENDOSCOPY;   Service: Gastroenterology;;   HOT HEMOSTASIS N/A 01/03/2023   Procedure: HOT HEMOSTASIS (ARGON PLASMA COAGULATION/BICAP);  Surgeon: Andres Andres White., MD;  Location: [facility] ENDOSCOPY;  Service: Gastroenterology;  Laterality: N/A;   KNEE SURGERY Right    POLYPECTOMY  07/13/2020   Procedure: POLYPECTOMY;  Surgeon: Andres Gwendlyn DASEN, MD;  Location[facility] ENDOSCOPY;  Service: Endoscopy;;   POLYPECTOMY  11/28/2020   Procedure: POLYPECTOMY;  Surgeon: Andres Andres White., MD;  Location: [facility] ENDOSCOPY;  Service: Gastroenterology;;   SUBMUCOSAL LIFTING INJECTION  11/28/2020   Procedure: SUBMUCOSAL LIFTING INJECTION;  Surgeon: Andres Andres White., MD;  Location: [facility] ENDOSCOPY;  Service: Gastroenterology;;   SUBMUCOSAL TATTOO INJECTION  11/28/2020   Procedure: SUBMUCOSAL TATTOO INJECTION;  Surgeon: Andres Andres White., MD;  Location: [facility] ENDOSCOPY;  Service: Gastroenterology;;   SUBMUCOSAL TATTOO INJECTION  01/03/2023   Procedure: SUBMUCOSAL TATTOO INJECTION;  Surgeon: Andres Andres White., MD;  Location: [facility] ENDOSCOPY;  Service: Gastroenterology;;    Current Medications: No outpatient medications have been marked as taking for the 05/13/24 encounter (Appointment) with Andres Jon Garre, PA.     Allergies:   Patient has no known allergies.   Social History   Socioeconomic History   Marital status: Single    Spouse name: Not on file   Number of children: 1   Years of education: 12   Highest education level: 12th grade  Occupational History   Not on file  Tobacco Use   Smoking status: Former    Current packs/day: 0.50    Average packs/day: 0.5 packs/day for 20.0 years (10.0 ttl pk-yrs)    Types: Cigarettes    Passive exposure: Current   Smokeless tobacco: Never   Tobacco comments:    Verified by Andres White, 2 cigarettes per day 10-23-22  Vaping Use   Vaping status: Never Used  Substance and Sexual Activity   Alcohol use: Not Currently    Comment: occasional   Drug  use: No   Sexual activity: Not Currently  Other Topics Concern   Not on file  Social History Narrative   Lives with Andres, son in law and 2 grandchildren   Right Handed   Drinks 1 cup caffeine 4 times a week-ish   Retired    Chief Executive Officer Drivers of Corporate investment banker Strain: Low Risk  (03/31/2022)   Overall Financial Resource Strain (CARDIA)    Difficulty of Paying Living Expenses: Not hard at all  Food Insecurity: No Food Insecurity (01/01/2023)   Hunger Vital Sign    Worried About Running Out of Food in the Last Year: Never true    Ran Out of Food in the Last Year: Never true  Transportation Needs: No Transportation Needs (01/01/2023)   PRAPARE - Administrator, Civil Service (Medical): No    Lack of Transportation (Non-Medical): No  Physical Activity: Insufficiently Active (10/21/2022)   Exercise Vital Sign    Days of Exercise per Week: 1 day    Minutes of Exercise per Session: 10 min  Stress: Stress Concern Present (10/21/2022)   [employer] of Occupational Health - Occupational Stress Questionnaire    Feeling  of Stress : To some extent  Social Connections: Socially Isolated (03/31/2022)   Social Connection and Isolation Panel    Frequency of Communication with Friends and Family: More than three times a week    Frequency of Social Gatherings with Friends and Family: More than three times a week    Attends Religious Services: Never    Database administrator or Organizations: No    Attends Banker Meetings: Never    Marital Status: Divorced     Family History: The patient's ***family history includes Hypertension in his mother. There is no history of Colon cancer, Pancreatic cancer, Stomach cancer, Esophageal cancer, Inflammatory bowel disease, Liver disease, Rectal cancer, Stroke, or Alzheimer's disease.  ROS:   Please see the history of present illness.    *** All other systems reviewed and are negative.  EKGs/Labs/Other Studies  Reviewed:    The following studies were reviewed today: ***      Recent Labs: 05/19/2023: BUN CANCELED; Creatinine, Ser CANCELED; Hemoglobin 10.4; Magnesium  CANCELED; Platelets 189; Potassium CANCELED; Sodium CANCELED  Recent Lipid Panel    Component Value Date/Time   CHOL 75 01/01/2023 0832   TRIG 49 01/01/2023 0832   HDL 27 (L) 01/01/2023 0832   CHOLHDL 2.8 01/01/2023 0832   VLDL 10 01/01/2023 0832   LDLCALC 38 01/01/2023 0832     Risk Assessment/Calculations:   {Does this patient have ATRIAL FIBRILLATION?:[id number]}  No BP recorded.  {Refresh Note OR Click here to enter BP  :1}***         Physical Exam:    VS:  There were no vitals taken for this visit.    Wt Readings from Last 3 Encounters:  06/25/23 165 lb (74.8 kg)  05/19/23 169 lb (76.7 kg)  03/11/23 165 lb 6.4 oz (75 kg)     GEN: *** Well nourished, well developed in no acute distress HEENT: Normal NECK: No JVD; No carotid bruits LYMPHATICS: No lymphadenopathy CARDIAC: ***RRR, no murmurs, rubs, gallops RESPIRATORY:  Clear to auscultation without rales, wheezing or rhonchi  ABDOMEN: Soft, non-tender, non-distended MUSCULOSKELETAL:  No edema; No deformity  SKIN: Warm and dry NEUROLOGIC:  Alert and oriented x 3 PSYCHIATRIC:  Normal affect   ASSESSMENT:    No diagnosis found. PLAN:    In order of problems listed above:  ***      {Are you ordering a CV Procedure (e.g. stress test, cath, DCCV, TEE, etc)?   Press F2        :[id number]}    Medication Adjustments/Labs and Tests Ordered: Current medicines are reviewed at length with the patient today.  Concerns regarding medicines are outlined above.  No orders of the defined types were placed in this encounter.  No orders of the defined types were placed in this encounter.   There are no Patient Instructions on file for this visit.   Signed, Jon White Syna Gad, PA  05/05/2024 1:18 PM    [facility]

## 2024-05-09 NOTE — Telephone Encounter (Signed)
 At 9:28 this morning Andres White, PT with [facility] left a vm re: delaying the start of Home Health due to pt's daughter's availability from work, he said they are looking at either today or tomorrow.Please call Andres at [phone]

## 2024-05-09 NOTE — Telephone Encounter (Signed)
 I called Andres White from [facility] and relayed that it is ok to start therapy tomorrow as per daughters schedule.

## 2024-05-10 NOTE — Telephone Encounter (Signed)
 FYI: [facility]) delay in start of care due to patient not returning phone calls. Can contact if have [phone]

## 2024-05-13 ENCOUNTER — Ambulatory Visit: Admitting: Physician Assistant

## 2024-05-13 NOTE — Telephone Encounter (Signed)
 Andres White called wanting to inform provider that the pt has not been responding to the VM that she has left for him and therefore she has not been able to schedule an Eval. Pt will be a non admit. [phone]

## 2024-05-16 NOTE — Telephone Encounter (Signed)
 Noted. Patient did ready mychart message with contact number

## 2024-06-10 ENCOUNTER — Ambulatory Visit: Admitting: Physician Assistant

## 2024-06-22 NOTE — Progress Notes (Deleted)
 Cardiology Clinic Note   Patient Name: Andres White Date of Encounter: 06/22/2024  Primary Care Provider:  Thermon Almarie NOVAK, FNP Primary Cardiologist:  Darryle ONEIDA Decent, MD  Patient Profile    Andres White 78 year old male presents to the clinic today for follow-up evaluation of his ventricular tachycardia, hyperlipidemia, and peripheral arterial disease.  Past Medical History    Past Medical History:  Diagnosis Date   Arthritis    Chronic back pain    Duodenal adenoma    Gout    Hypertension    Stroke (HCC) 06/2020   Syncope and collapse    Past Surgical History:  Procedure Laterality Date   BIOPSY  07/13/2020   Procedure: BIOPSY;  Surgeon: Aneita Gwendlyn ONEIDA, MD;  Location: [facility] ENDOSCOPY;  Service: Endoscopy;;   BIOPSY  11/28/2020   Procedure: BIOPSY;  Surgeon: Wilhelmenia Aloha Raddle., MD;  Location: [facility];  Service: Gastroenterology;;   CARDIAC CATHETERIZATION  2015   [facility]    COLONOSCOPY WITH PROPOFOL  N/A 07/13/2020   Procedure: COLONOSCOPY WITH PROPOFOL ;  Surgeon: Aneita Gwendlyn ONEIDA, MD;  Location: [facility] ENDOSCOPY;  Service: Endoscopy;  Laterality: N/A;   COLONOSCOPY WITH PROPOFOL  N/A 11/28/2020   Procedure: COLONOSCOPY WITH PROPOFOL ;  Surgeon: Mansouraty, Aloha Raddle., MD;  Location: [facility] ENDOSCOPY;  Service: Gastroenterology;  Laterality: N/A;   ENDOSCOPIC MUCOSAL RESECTION N/A 11/28/2020   Procedure: ENDOSCOPIC MUCOSAL RESECTION;  Surgeon: Wilhelmenia Aloha Raddle., MD;  Location: [facility] ENDOSCOPY;  Service: Gastroenterology;  Laterality: N/A;   ENTEROSCOPY N/A 01/03/2023   Procedure: ENTEROSCOPY;  Surgeon: Wilhelmenia Aloha Raddle., MD;  Location: [facility] ENDOSCOPY;  Service: Gastroenterology;  Laterality: N/A;   ESOPHAGOGASTRODUODENOSCOPY N/A 07/13/2020   Procedure: ESOPHAGOGASTRODUODENOSCOPY (EGD);  Surgeon: Aneita Gwendlyn ONEIDA, MD;  Location: [facility] ENDOSCOPY;  Service: Endoscopy;  Laterality: N/A;   ESOPHAGOGASTRODUODENOSCOPY (EGD) WITH PROPOFOL  N/A 11/28/2020   Procedure:  ESOPHAGOGASTRODUODENOSCOPY (EGD) WITH PROPOFOL ;  Surgeon: Wilhelmenia Aloha Raddle., MD;  Location: [facility] ENDOSCOPY;  Service: Gastroenterology;  Laterality: N/A;   HEMOSTASIS CLIP PLACEMENT  11/28/2020   Procedure: HEMOSTASIS CLIP PLACEMENT;  Surgeon: Wilhelmenia Aloha Raddle., MD;  Location: [facility] ENDOSCOPY;  Service: Gastroenterology;;   HOT HEMOSTASIS N/A 01/03/2023   Procedure: HOT HEMOSTASIS (ARGON PLASMA COAGULATION/BICAP);  Surgeon: Wilhelmenia Aloha Raddle., MD;  Location: [facility] ENDOSCOPY;  Service: Gastroenterology;  Laterality: N/A;   KNEE SURGERY Right    POLYPECTOMY  07/13/2020   Procedure: POLYPECTOMY;  Surgeon: Aneita Gwendlyn ONEIDA, MD;  Location: [facility] ENDOSCOPY;  Service: Endoscopy;;   POLYPECTOMY  11/28/2020   Procedure: POLYPECTOMY;  Surgeon: Wilhelmenia Aloha Raddle., MD;  Location: [facility];  Service: Gastroenterology;;   SUBMUCOSAL LIFTING INJECTION  11/28/2020   Procedure: SUBMUCOSAL LIFTING INJECTION;  Surgeon: Wilhelmenia Aloha Raddle., MD;  Location: [facility];  Service: Gastroenterology;;   SUBMUCOSAL TATTOO INJECTION  11/28/2020   Procedure: SUBMUCOSAL TATTOO INJECTION;  Surgeon: Wilhelmenia Aloha Raddle., MD;  Location: [facility];  Service: Gastroenterology;;   SUBMUCOSAL TATTOO INJECTION  01/03/2023   Procedure: SUBMUCOSAL TATTOO INJECTION;  Surgeon: Wilhelmenia Aloha Raddle., MD;  Location: [facility] ENDOSCOPY;  Service: Gastroenterology;;    Allergies  No Known Allergies  History of Present Illness    Andres White has a PMH of PVCs, VT, chronic diastolic CHF, history of CVA with residual right sided weakness, carotid artery stenosis, HTN, HLD, PAD, CKD, BPH, gout, and dementia.  He is a patient of Dr. Decent.  He wore a cardiac event monitor 1/22 which showed 158 episodes of wide-complex tachycardia (favor VT over SVT).  Episodes  were brief.  He was not started on beta-blocker therapy due to his history of bradycardia.  He underwent stress testing 1/22 which was normal.  He was noted to have  low risk.  There is no evidence of ischemia or infarct.  He had ABIs 5/22 which showed moderate right lower extremity arterial disease, noncompressible left lower extremity arteries, he follows with vascular surgery.  He was previously admitted with fatigue and lethargy.  His hemoglobin was noted to be 5.  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  Cardiology was consulted during his admission for ectopy.  His K and mag were repleted.  His ectopy subsided and he was noted to have rare PVCs on telemetry.  His echocardiogram 12/31/2022 showed an EF of 60-65%, no regional wall motion abnormalities, moderate LVH, normal RV function.  He was seen in follow-up by Rollo Louder, PA-C on 05/19/2023.  During that time he was accompanied by his daughter and grandson.  He was living with his daughter.  She is very involved with his care.  He was taking Lasix  every other day.  He denied shortness of breath and lower extremity swelling.  He denied palpitations, dizziness, presyncope and syncope.  He was continued on aspirin  for his history of CVA and carotid disease.  He denied GI bleeding, hematuria and other bleeding.  His daughter reported that since his hospitalization 5/24 he had decreased physical activity.  He was noted to have decreased strength.  He declined physical therapy and Occupational Therapy.  He was spending most of his time in his wheelchair.  His family was able to help him transition as needed.  They were encouraged him to increase his physical activity and use his walker.  He was following with his PCP and neurology.  His blood pressure was well-controlled.  He presents to the clinic today for follow-up evaluation and states***.  *** denies chest pain, shortness of breath, lower extremity edema, fatigue, palpitations, melena, hematuria, hemoptysis, diaphoresis, weakness, presyncope, syncope, orthopnea, and PND.  PVCs, NSVT-EKG today shows***.  Previously noted to have  some NSVT, occasional PVCs 6% burden, and PACs 4% burden on cardiac monitor 2/24.  Echocardiogram 5/24 showed normal EF and no regional wall motion abnormalities, moderate asymmetric LVH, and normal RV function.  Stress testing 1/22 was normal and considered low risk.  He is not a candidate for beta-blocker therapy due to baseline bradycardia.  Continues to deny lightheadedness, palpitations, presyncope or syncope. Maintain p.o. hydration Heart healthy diet Maintain physical activity as tolerated Avoid triggers caffeine, chocolate, EtOH, dehydration excetra.  Chronic diastolic CHF-euvolemic today.  Weight stable.  Echocardiogram 5/24 showed normal EF, moderate LVH, and normal RV function. Daily weights Weight log Heart healthy low-sodium diet Continue furosemide  Order BMP, CBC  History of CVA, dementia-mood appropriate today.  Continues to live with his daughter.  Memory continues to slowly decline. Maintain physical activity as tolerated Continue aspirin , atorvastatin   Carotid stenosis-denies lightheadedness, presyncope or syncope.  CTA head and neck 5/24 showed 80% stenosis of proximal left cervical ICA. High-fiber diet Continue current medical therapy  Essential hypertension-BP today***. Maintain blood pressure log Increase physical activity as tolerated  Hyperlipidemia-LDL***. Continue atorvastatin   Disposition: Follow-up with Dr. Barbaraann or me in 6 months.  Home Medications    Prior to Admission medications   Medication Sig Start Date End Date Taking? Authorizing Provider  allopurinol  (ZYLOPRIM ) 100 MG tablet Take 1 tablet (100 mg total) by mouth daily. TAKE 1 TABLET(100 MG) BY  MOUTH DAILY 10/03/22   Raulkar, Sven SQUIBB, MD  aspirin  EC 81 MG tablet Take 1 tablet (81 mg total) by mouth daily. Resume on 6/4 01/04/23   Jillian Buttery, MD  atorvastatin  (LIPITOR) 40 MG tablet TAKE 1 TABLET BY MOUTH ONCE DAILY 04/10/22   Raulkar, Sven SQUIBB, MD  citalopram  (CELEXA ) 10 MG tablet Take 10 mg  by mouth daily. 12/01/22   [provider]  colchicine  0.6 MG tablet Take 0.6 mg by mouth daily.    [provider]  donepezil  (ARICEPT ) 5 MG tablet Take 1 tablet (5 mg total) by mouth at bedtime. 04/28/24   Whitfield Raisin, NP  ferrous sulfate  300 (60 Fe) MG/5ML syrup Take 5 mLs (300 mg total) by mouth daily with breakfast. 01/05/23   Jillian Buttery, MD  furosemide  (LASIX ) 20 MG tablet Take 1 tablet (20 mg total) by mouth every morning. Patient taking differently: Take 20 mg by mouth every other day. 09/12/22   O'NealDarryle Ned, MD  levETIRAcetam  (KEPPRA ) 500 MG tablet Take 1 tablet (500 mg total) by mouth 2 (two) times daily. 04/28/24   Whitfield Raisin, NP  Melatonin-Pyridoxine (MELATIN PO) Take by mouth.    [provider]  Multiple Vitamin (MULTI VITAMIN) TABS 1 tablet Orally Once a day    [provider]  Multiple Vitamin (MULTIVITAMIN WITH MINERALS) TABS tablet Take 1 tablet by mouth daily. Patient taking differently: Take 1 tablet by mouth daily at 12 noon. 07/19/20   Love, Sharlet RAMAN, PA-C  potassium chloride  (KLOR-CON ) 20 MEQ packet Take 40 mEq by mouth daily for 14 days. 01/04/23 04/28/24  Jillian Buttery, MD  QUEtiapine  (SEROQUEL ) 50 MG tablet Take 50 mg by mouth at bedtime. 09/01/22   [provider]  sucralfate  (CARAFATE ) 1 g tablet Take 1 tablet (1 g total) by mouth 2 (two) times daily. 01/04/23 04/28/24  Jillian Buttery, MD  tamsulosin  (FLOMAX ) 0.4 MG CAPS capsule Take 1 capsule (0.4 mg total) by mouth daily after supper. 09/04/22   Samtani, Jai-Gurmukh, MD  traMADol  (ULTRAM ) 50 MG tablet Take 1 tablet (50 mg total) by mouth 2 (two) times daily as needed. 10/03/22   Raulkar, Sven SQUIBB, MD  traZODone  (DESYREL ) 50 MG tablet Take 50 mg by mouth at bedtime.    [provider]    Family History    Family History  Problem Relation Age of Onset   Hypertension Mother    Colon cancer Neg Hx    Pancreatic cancer Neg Hx    Stomach cancer Neg Hx     Esophageal cancer Neg Hx    Inflammatory bowel disease Neg Hx    Liver disease Neg Hx    Rectal cancer Neg Hx    Stroke Neg Hx    Alzheimer's disease Neg Hx    He indicated that his mother is alive. He indicated that his father is deceased. He indicated that the status of his neg hx is unknown.  Social History    Social History   Socioeconomic History   Marital status: Single    Spouse name: Not on file   Number of children: 1   Years of education: 12   Highest education level: 12th grade  Occupational History   Not on file  Tobacco Use   Smoking status: Former    Current packs/day: 0.50    Average packs/day: 0.5 packs/day for 20.0 years (10.0 ttl pk-yrs)    Types: Cigarettes    Passive exposure: Current   Smokeless tobacco: Never  Tobacco comments:    Verified by Daughter - [name], 2 cigarettes per day 10-23-22  Vaping Use   Vaping status: Never Used  Substance and Sexual Activity   Alcohol use: Not Currently    Comment: occasional   Drug use: No   Sexual activity: Not Currently  Other Topics Concern   Not on file  Social History Narrative   Lives with daughter, son in law and 2 grandchildren   Right Handed   Drinks 1 cup caffeine 4 times a week-ish   Retired    Chief Executive Officer Drivers of Corporate Investment Banker Strain: Low Risk  (03/31/2022)   Overall Financial Resource Strain (CARDIA)    Difficulty of Paying Living Expenses: Not hard at all  Food Insecurity: No Food Insecurity (01/01/2023)   Hunger Vital Sign    Worried About Running Out of Food in the Last Year: Never true    Ran Out of Food in the Last Year: Never true  Transportation Needs: No Transportation Needs (01/01/2023)   PRAPARE - Administrator, Civil Service (Medical): No    Lack of Transportation (Non-Medical): No  Physical Activity: Insufficiently Active (10/21/2022)   Exercise Vital Sign    Days of Exercise per Week: 1 day    Minutes of Exercise per Session: 10 min  Stress:  Stress Concern Present (10/21/2022)   [employer] of Occupational Health - Occupational Stress Questionnaire    Feeling of Stress : To some extent  Social Connections: Socially Isolated (03/31/2022)   Social Connection and Isolation Panel    Frequency of Communication with Friends and Family: More than three times a week    Frequency of Social Gatherings with Friends and Family: More than three times a week    Attends Religious Services: Never    Database Administrator or Organizations: No    Attends Banker Meetings: Never    Marital Status: Divorced  Catering Manager Violence: Patient Unable To Answer (01/01/2023)   Humiliation, Afraid, Rape, and Kick questionnaire    Fear of Current or Ex-Partner: Patient unable to answer    Emotionally Abused: Patient unable to answer    Physically Abused: Patient unable to answer    Sexually Abused: Patient unable to answer     Review of Systems    General:  No chills, fever, night sweats or weight changes.  Cardiovascular:  No chest pain, dyspnea on exertion, edema, orthopnea, palpitations, paroxysmal nocturnal dyspnea. Dermatological: No rash, lesions/masses Respiratory: No cough, dyspnea Urologic: No hematuria, dysuria Abdominal:   No nausea, vomiting, diarrhea, bright red blood per rectum, melena, or hematemesis Neurologic:  No visual changes, wkns, changes in mental status. All other systems reviewed and are otherwise negative except as noted above.  Physical Exam    VS:  There were no vitals taken for this visit. , BMI There is no height or weight on file to calculate BMI. GEN: Well nourished, well developed, in no acute distress. HEENT: normal. Neck: Supple, no JVD, carotid bruits, or masses. Cardiac: RRR, no murmurs, rubs, or gallops. No clubbing, cyanosis, edema.  Radials/DP/PT 2+ and equal bilaterally.  Respiratory:  Respirations regular and unlabored, clear to auscultation bilaterally. GI: Soft, nontender,  nondistended, BS + x 4. MS: no deformity or atrophy. Skin: warm and dry, no rash. Neuro:  Strength and sensation are intact. Psych: Normal affect.  Accessory Clinical Findings    Recent Labs: No results found for requested labs within last 365 days.  Recent Lipid Panel    Component Value Date/Time   CHOL 75 01/01/2023 0832   TRIG 49 01/01/2023 0832   HDL 27 (L) 01/01/2023 0832   CHOLHDL 2.8 01/01/2023 0832   VLDL 10 01/01/2023 0832   LDLCALC 38 01/01/2023 0832    No BP recorded.  {Refresh Note OR Click here to enter BP  :1}***    ECG personally reviewed by me today- ***     Echocardiogram 12/31/2022  IMPRESSIONS     1. Left ventricular ejection fraction, by estimation, is 60 to 65%. The  left ventricle has normal function. The left ventricle has no regional  wall motion abnormalities. There is moderate asymmetric left ventricular  hypertrophy of the inferior segment.  Left ventricular diastolic parameters are indeterminate.   2. Right ventricular systolic function is normal. The right ventricular  size is normal.   3. The mitral valve is grossly normal. No evidence of mitral valve  regurgitation. No evidence of mitral stenosis.   4. The aortic valve is tricuspid. There is mild calcification of the  aortic valve. Aortic valve regurgitation is not visualized. Aortic valve  sclerosis is present, with no evidence of aortic valve stenosis.   Comparison(s): No significant change from prior study.   FINDINGS   Left Ventricle: Left ventricular ejection fraction, by estimation, is 60  to 65%. The left ventricle has normal function. The left ventricle has no  regional wall motion abnormalities. The left ventricular internal cavity  size was normal in size. There is   moderate asymmetric left ventricular hypertrophy of the inferior segment.  Left ventricular diastolic parameters are indeterminate.   Right Ventricle: The right ventricular size is normal. No increase in   right ventricular wall thickness. Right ventricular systolic function is  normal.   Left Atrium: Left atrial size was normal in size.   Right Atrium: Right atrial size was normal in size.   Pericardium: Trivial pericardial effusion is present. The pericardial  effusion is circumferential.   Mitral Valve: The mitral valve is grossly normal. No evidence of mitral  valve regurgitation. No evidence of mitral valve stenosis. MV peak  gradient, 3.3 mmHg. The mean mitral valve gradient is 1.0 mmHg.   Tricuspid Valve: The tricuspid valve is normal in structure. Tricuspid  valve regurgitation is not demonstrated. No evidence of tricuspid  stenosis.   Aortic Valve: The aortic valve is tricuspid. There is mild calcification  of the aortic valve. There is mild aortic valve annular calcification.  Aortic valve regurgitation is not visualized. Aortic valve sclerosis is  present, with no evidence of aortic  valve stenosis. Aortic valve mean gradient measures 4.0 mmHg. Aortic valve  peak gradient measures 5.9 mmHg. Aortic valve area, by VTI measures 2.01  cm.   Pulmonic Valve: The pulmonic valve was normal in structure. Pulmonic valve  regurgitation is not visualized. No evidence of pulmonic stenosis.   Aorta: The aortic root and ascending aorta are structurally normal, with  no evidence of dilitation.   IAS/Shunts: No atrial level shunt detected by color flow Doppler.      Assessment & Plan   1.  ***   Josefa HERO. Araeya Lamb NP-C     06/22/2024, 9:52 AM [facility] [address] [place], [place] [address] Office [phone]    Notice: This dictation was prepared with Dragon dictation along with smaller phrase technology. Any transcriptional errors that result from this process are unintentional and may not be corrected upon review.  I spent***minutes examining this patient, reviewing medications, and using patient centered shared decision  making involving their cardiac care.   I spent  20 minutes reviewing past medical history,  medications, and prior cardiac tests.

## 2024-06-24 ENCOUNTER — Ambulatory Visit: Admitting: General Practice

## 2024-08-11 ENCOUNTER — Encounter: Payer: Self-pay | Admitting: *Deleted

## 2024-08-12 ENCOUNTER — Ambulatory Visit: Admitting: Emergency Medicine

## 2024-08-12 NOTE — Progress Notes (Unsigned)
 " Cardiology Office Note:    Date:  08/12/2024  ID:  Andres White, DOB August 19, 1945, MRN [id number] PCP: Andres Almarie NOVAK, FNP  [facility] Providers Cardiologist:  Andres ONEIDA Decent, MD { Click to update primary MD,subspecialty MD or APP then REFRESH:1}    {Click to Open Review  :1}   Patient Profile:       Chief Complaint: *** History of Present Illness:  Andres White is a 79 y.o. male with visit-pertinent history of PVCs, VT, chronic diastolic heart failure, history of CVA with residual right-sided weakness, carotid artery stenosis, peripheral arterial disease, hypertension, hyperlipidemia, CKD, BPH, gout, dementia  He previously wore a cardiac monitor in 08/2020 that showed 158 episodes of wide-complex tachycardia (favor VT over SVT).  Episodes were brief.  Patient was not started on beta-blockade therapy due to history of bradycardia.  He underwent nuclear stress testing on 08/2020 was a normal low risk study without ischemia or infarction.  He also has history of PAD and ABIs from 12/2020 indicated moderate right lower extremity arterial disease, noncompressible left lower extremity arteries.  He does follow with vascular surgery.  He was admitted in 09/2022 with sepsis secondary to UTI.  He had worsening right sided weakness so there was concern for CVA.  Patient wore cardiac event monitor that showed NSVT, occasional PVCs at 6% burden.  He was admitted in 12/2022 with fatigue and lethargy.  He was noted to have an increased in episodes of PVCs and NSVT on telemetry.  Cardiology was consulted for increased ectopy.  After potassium magnesium  were replaced arrhythmias have subsided and patient only had rare PVCs on telemetry.  He underwent echocardiogram on 12/2022 that showed LVEF 60 to 65%, no RWMA, moderate LVH, normal RV function.  During this admission he was found to have a hemoglobin of 5..  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  He was also found  to have an AKI with creatinine 1.6 thought to be secondary to urinary retention.  He had a Foley catheter placed.  Last seen in clinic on 05/19/2023 by Andres White.  Patient was doing well at the time.  No medications were changed.  He was to follow-up in 6 months.  Discussed the use of AI scribe software for clinical note transcription with the patient, who gave verbal consent to proceed.  History of Present Illness     Review of systems:  Please see the history of present illness. All other systems are reviewed and otherwise negative. ***      Studies Reviewed:        ***  Risk Assessment/Calculations:   {Does this patient have ATRIAL FIBRILLATION?[phone]} No BP recorded.  {Refresh Note OR Click here to enter BP  :1}***        Physical Exam:   VS:  There were no vitals taken for this visit.   Wt Readings from Last 3 Encounters:  06/25/23 165 lb (74.8 kg)  05/19/23 169 lb (76.7 kg)  03/11/23 165 lb 6.4 oz (75 kg)    GEN: Well nourished, well developed in no acute distress NECK: No JVD; No carotid bruits CARDIAC: ***RRR, no murmurs, rubs, gallops RESPIRATORY:  Clear to auscultation without rales, wheezing or rhonchi  ABDOMEN: Soft, non-tender, non-distended EXTREMITIES:  No edema; No acute deformity ***      Assessment and Plan:    Assessment and Plan Assessment & Plan      {Are you ordering a  CV Procedure (e.g. stress test, cath, DCCV, TEE, etc)?   Press F2        :[id number]}  Dispo:  No follow-ups on file.  Signed, Andres LITTIE Louis, NP  "

## 2024-11-29 ENCOUNTER — Ambulatory Visit: Admitting: Adult Health
